# Patient Record
Sex: Female | Born: 1945 | Race: White | Hispanic: No | Marital: Married | Smoking: Former smoker
Health system: Southern US, Community
[De-identification: ages and names within clinical notes are randomized; demographics above are authoritative.]

## PROBLEM LIST (undated history)

## (undated) DIAGNOSIS — I119 Hypertensive heart disease without heart failure: Secondary | ICD-10-CM

## (undated) DIAGNOSIS — I10 Essential (primary) hypertension: Secondary | ICD-10-CM

## (undated) DIAGNOSIS — E7800 Pure hypercholesterolemia, unspecified: Secondary | ICD-10-CM

## (undated) DIAGNOSIS — E119 Type 2 diabetes mellitus without complications: Secondary | ICD-10-CM

## (undated) DIAGNOSIS — F32A Depression, unspecified: Secondary | ICD-10-CM

## (undated) DIAGNOSIS — I2510 Atherosclerotic heart disease of native coronary artery without angina pectoris: Secondary | ICD-10-CM

## (undated) DIAGNOSIS — R0989 Other specified symptoms and signs involving the circulatory and respiratory systems: Secondary | ICD-10-CM

## (undated) DIAGNOSIS — Z87891 Personal history of nicotine dependence: Secondary | ICD-10-CM

## (undated) DIAGNOSIS — C50912 Malignant neoplasm of unspecified site of left female breast: Secondary | ICD-10-CM

## (undated) DIAGNOSIS — T8859XA Other complications of anesthesia, initial encounter: Secondary | ICD-10-CM

## (undated) DIAGNOSIS — K2100 Gastro-esophageal reflux disease with esophagitis, without bleeding: Secondary | ICD-10-CM

## (undated) DIAGNOSIS — F329 Major depressive disorder, single episode, unspecified: Secondary | ICD-10-CM

## (undated) DIAGNOSIS — I739 Peripheral vascular disease, unspecified: Secondary | ICD-10-CM

## (undated) DIAGNOSIS — I2699 Other pulmonary embolism without acute cor pulmonale: Secondary | ICD-10-CM

## (undated) DIAGNOSIS — T4145XA Adverse effect of unspecified anesthetic, initial encounter: Secondary | ICD-10-CM

## (undated) HISTORY — PX: MASTECTOMY: SHX3

## (undated) HISTORY — DX: Personal history of nicotine dependence: Z87.891

## (undated) HISTORY — DX: Major depressive disorder, single episode, unspecified: F32.9

## (undated) HISTORY — PX: CATARACT EXTRACTION W/ INTRAOCULAR LENS  IMPLANT, BILATERAL: SHX1307

## (undated) HISTORY — DX: Gastro-esophageal reflux disease with esophagitis: K21.0

## (undated) HISTORY — PX: EYE SURGERY: SHX253

## (undated) HISTORY — PX: BUNIONECTOMY: SHX129

## (undated) HISTORY — PX: RETINAL DETACHMENT SURGERY: SHX105

## (undated) HISTORY — DX: Pure hypercholesterolemia, unspecified: E78.00

## (undated) HISTORY — DX: Atherosclerotic heart disease of native coronary artery without angina pectoris: I25.10

## (undated) HISTORY — DX: Gastro-esophageal reflux disease with esophagitis, without bleeding: K21.00

## (undated) HISTORY — DX: Depression, unspecified: F32.A

## (undated) HISTORY — PX: LAPAROSCOPIC CHOLECYSTECTOMY: SUR755

## (undated) HISTORY — DX: Essential (primary) hypertension: I10

## (undated) HISTORY — PX: BREAST BIOPSY: SHX20

---

## 1997-06-15 ENCOUNTER — Emergency Department (HOSPITAL_COMMUNITY): Admission: EM | Admit: 1997-06-15 | Discharge: 1997-06-15 | Payer: Self-pay | Admitting: Emergency Medicine

## 1997-06-18 ENCOUNTER — Encounter (HOSPITAL_COMMUNITY): Admission: RE | Admit: 1997-06-18 | Discharge: 1997-09-16 | Payer: Self-pay | Admitting: Emergency Medicine

## 1998-09-27 ENCOUNTER — Ambulatory Visit: Admission: RE | Admit: 1998-09-27 | Discharge: 1998-09-27 | Payer: Self-pay | Admitting: Family Medicine

## 1998-12-13 ENCOUNTER — Other Ambulatory Visit: Admission: RE | Admit: 1998-12-13 | Discharge: 1998-12-13 | Payer: Self-pay | Admitting: Family Medicine

## 2000-05-14 ENCOUNTER — Other Ambulatory Visit: Admission: RE | Admit: 2000-05-14 | Discharge: 2000-05-14 | Payer: Self-pay | Admitting: Family Medicine

## 2001-06-02 ENCOUNTER — Other Ambulatory Visit: Admission: RE | Admit: 2001-06-02 | Discharge: 2001-06-02 | Payer: Self-pay | Admitting: Family Medicine

## 2002-02-11 ENCOUNTER — Inpatient Hospital Stay (HOSPITAL_COMMUNITY): Admission: EM | Admit: 2002-02-11 | Discharge: 2002-02-14 | Payer: Self-pay | Admitting: Emergency Medicine

## 2002-02-11 ENCOUNTER — Encounter: Payer: Self-pay | Admitting: Family Medicine

## 2002-02-11 ENCOUNTER — Encounter: Payer: Self-pay | Admitting: Emergency Medicine

## 2002-02-23 ENCOUNTER — Encounter: Admission: RE | Admit: 2002-02-23 | Discharge: 2002-02-23 | Payer: Self-pay | Admitting: Family Medicine

## 2003-04-16 ENCOUNTER — Other Ambulatory Visit: Admission: RE | Admit: 2003-04-16 | Discharge: 2003-04-16 | Payer: Self-pay | Admitting: Family Medicine

## 2003-12-25 ENCOUNTER — Ambulatory Visit: Payer: Self-pay | Admitting: Family Medicine

## 2004-04-12 HISTORY — PX: CARDIAC CATHETERIZATION: SHX172

## 2004-05-01 ENCOUNTER — Other Ambulatory Visit: Admission: RE | Admit: 2004-05-01 | Discharge: 2004-05-01 | Payer: Self-pay | Admitting: Family Medicine

## 2004-05-01 ENCOUNTER — Ambulatory Visit: Payer: Self-pay | Admitting: Family Medicine

## 2004-05-12 ENCOUNTER — Inpatient Hospital Stay (HOSPITAL_COMMUNITY): Admission: EM | Admit: 2004-05-12 | Discharge: 2004-05-20 | Payer: Self-pay | Admitting: Emergency Medicine

## 2004-05-12 ENCOUNTER — Ambulatory Visit: Payer: Self-pay | Admitting: Cardiology

## 2004-05-12 ENCOUNTER — Encounter: Payer: Self-pay | Admitting: Emergency Medicine

## 2004-05-12 HISTORY — PX: CORONARY ARTERY BYPASS GRAFT: SHX141

## 2004-05-13 ENCOUNTER — Encounter: Payer: Self-pay | Admitting: Cardiology

## 2004-05-28 ENCOUNTER — Ambulatory Visit: Payer: Self-pay | Admitting: Cardiology

## 2004-09-02 ENCOUNTER — Ambulatory Visit: Payer: Self-pay | Admitting: Cardiology

## 2004-09-09 ENCOUNTER — Ambulatory Visit: Payer: Self-pay | Admitting: Cardiology

## 2004-10-24 ENCOUNTER — Ambulatory Visit: Payer: Self-pay | Admitting: Cardiology

## 2004-10-31 ENCOUNTER — Ambulatory Visit: Payer: Self-pay | Admitting: Cardiology

## 2005-01-08 ENCOUNTER — Ambulatory Visit: Payer: Self-pay | Admitting: Cardiology

## 2005-02-16 ENCOUNTER — Ambulatory Visit: Payer: Self-pay | Admitting: Cardiology

## 2005-09-02 ENCOUNTER — Ambulatory Visit: Payer: Self-pay | Admitting: Cardiology

## 2005-09-09 ENCOUNTER — Ambulatory Visit: Payer: Self-pay

## 2006-07-05 ENCOUNTER — Other Ambulatory Visit: Admission: RE | Admit: 2006-07-05 | Discharge: 2006-07-05 | Payer: Self-pay | Admitting: Obstetrics & Gynecology

## 2006-08-19 ENCOUNTER — Encounter: Admission: RE | Admit: 2006-08-19 | Discharge: 2006-08-19 | Payer: Self-pay | Admitting: Obstetrics & Gynecology

## 2006-09-01 ENCOUNTER — Encounter (INDEPENDENT_AMBULATORY_CARE_PROVIDER_SITE_OTHER): Payer: Self-pay | Admitting: Diagnostic Radiology

## 2006-09-01 ENCOUNTER — Encounter: Admission: RE | Admit: 2006-09-01 | Discharge: 2006-09-01 | Payer: Self-pay | Admitting: Obstetrics & Gynecology

## 2006-10-14 ENCOUNTER — Ambulatory Visit (HOSPITAL_COMMUNITY): Admission: RE | Admit: 2006-10-14 | Discharge: 2006-10-15 | Payer: Self-pay | Admitting: Ophthalmology

## 2006-10-18 DIAGNOSIS — I10 Essential (primary) hypertension: Secondary | ICD-10-CM | POA: Insufficient documentation

## 2006-10-18 DIAGNOSIS — E1051 Type 1 diabetes mellitus with diabetic peripheral angiopathy without gangrene: Secondary | ICD-10-CM

## 2006-10-18 DIAGNOSIS — F329 Major depressive disorder, single episode, unspecified: Secondary | ICD-10-CM

## 2006-10-18 DIAGNOSIS — E1065 Type 1 diabetes mellitus with hyperglycemia: Secondary | ICD-10-CM

## 2007-06-29 ENCOUNTER — Encounter: Admission: RE | Admit: 2007-06-29 | Discharge: 2007-06-29 | Payer: Self-pay | Admitting: Family Medicine

## 2007-11-10 ENCOUNTER — Ambulatory Visit: Payer: Self-pay | Admitting: Cardiology

## 2007-11-10 LAB — CONVERTED CEMR LAB: Total CK: 153 units/L (ref 7–177)

## 2008-01-19 ENCOUNTER — Ambulatory Visit: Payer: Self-pay | Admitting: Cardiology

## 2008-01-19 ENCOUNTER — Ambulatory Visit: Payer: Self-pay

## 2008-02-07 ENCOUNTER — Encounter: Payer: Self-pay | Admitting: Cardiology

## 2008-02-07 ENCOUNTER — Ambulatory Visit: Payer: Self-pay

## 2008-02-07 ENCOUNTER — Ambulatory Visit: Payer: Self-pay | Admitting: Cardiology

## 2008-05-10 DIAGNOSIS — K210 Gastro-esophageal reflux disease with esophagitis: Secondary | ICD-10-CM

## 2008-05-10 DIAGNOSIS — I25810 Atherosclerosis of coronary artery bypass graft(s) without angina pectoris: Secondary | ICD-10-CM

## 2008-05-10 DIAGNOSIS — E7800 Pure hypercholesterolemia, unspecified: Secondary | ICD-10-CM | POA: Insufficient documentation

## 2008-05-14 ENCOUNTER — Ambulatory Visit: Payer: Self-pay | Admitting: Cardiology

## 2008-05-14 DIAGNOSIS — I739 Peripheral vascular disease, unspecified: Secondary | ICD-10-CM | POA: Insufficient documentation

## 2008-05-14 DIAGNOSIS — R609 Edema, unspecified: Secondary | ICD-10-CM

## 2008-05-18 ENCOUNTER — Encounter: Payer: Self-pay | Admitting: Cardiology

## 2008-05-18 ENCOUNTER — Ambulatory Visit: Payer: Self-pay

## 2008-05-23 ENCOUNTER — Encounter: Payer: Self-pay | Admitting: Cardiology

## 2008-05-23 ENCOUNTER — Ambulatory Visit: Payer: Self-pay

## 2008-07-02 ENCOUNTER — Ambulatory Visit: Payer: Self-pay | Admitting: Cardiology

## 2008-07-02 DIAGNOSIS — I872 Venous insufficiency (chronic) (peripheral): Secondary | ICD-10-CM | POA: Insufficient documentation

## 2008-09-25 ENCOUNTER — Telehealth: Payer: Self-pay | Admitting: Cardiology

## 2009-02-05 ENCOUNTER — Encounter: Admission: RE | Admit: 2009-02-05 | Discharge: 2009-02-05 | Payer: Self-pay | Admitting: Obstetrics & Gynecology

## 2009-02-11 ENCOUNTER — Encounter: Admission: RE | Admit: 2009-02-11 | Discharge: 2009-02-11 | Payer: Self-pay | Admitting: Obstetrics & Gynecology

## 2009-02-12 ENCOUNTER — Encounter: Admission: RE | Admit: 2009-02-12 | Discharge: 2009-02-12 | Payer: Self-pay | Admitting: Obstetrics & Gynecology

## 2009-02-20 ENCOUNTER — Telehealth: Payer: Self-pay | Admitting: Cardiology

## 2009-02-21 ENCOUNTER — Encounter: Admission: RE | Admit: 2009-02-21 | Discharge: 2009-02-21 | Payer: Self-pay | Admitting: Obstetrics & Gynecology

## 2009-02-26 ENCOUNTER — Ambulatory Visit: Payer: Self-pay | Admitting: Cardiology

## 2009-02-26 DIAGNOSIS — R0989 Other specified symptoms and signs involving the circulatory and respiratory systems: Secondary | ICD-10-CM

## 2009-03-13 ENCOUNTER — Ambulatory Visit: Payer: Self-pay

## 2009-03-13 ENCOUNTER — Encounter: Payer: Self-pay | Admitting: Cardiology

## 2009-03-19 ENCOUNTER — Encounter: Admission: RE | Admit: 2009-03-19 | Discharge: 2009-03-19 | Payer: Self-pay | Admitting: Surgery

## 2009-03-21 ENCOUNTER — Ambulatory Visit (HOSPITAL_BASED_OUTPATIENT_CLINIC_OR_DEPARTMENT_OTHER): Admission: RE | Admit: 2009-03-21 | Discharge: 2009-03-21 | Payer: Self-pay | Admitting: Surgery

## 2009-03-27 ENCOUNTER — Encounter: Payer: Self-pay | Admitting: Cardiology

## 2009-03-29 ENCOUNTER — Ambulatory Visit: Payer: Self-pay | Admitting: Oncology

## 2009-04-03 ENCOUNTER — Encounter: Payer: Self-pay | Admitting: Cardiology

## 2009-05-29 ENCOUNTER — Encounter: Payer: Self-pay | Admitting: Cardiology

## 2009-10-30 ENCOUNTER — Ambulatory Visit: Payer: Self-pay | Admitting: Cardiology

## 2009-10-30 ENCOUNTER — Encounter: Payer: Self-pay | Admitting: Cardiology

## 2009-11-22 ENCOUNTER — Encounter: Payer: Self-pay | Admitting: Cardiology

## 2009-12-19 ENCOUNTER — Emergency Department (HOSPITAL_COMMUNITY): Admission: EM | Admit: 2009-12-19 | Discharge: 2009-08-06 | Payer: Self-pay | Admitting: Emergency Medicine

## 2010-02-13 ENCOUNTER — Other Ambulatory Visit: Payer: Self-pay | Admitting: Surgery

## 2010-02-13 DIAGNOSIS — Z1239 Encounter for other screening for malignant neoplasm of breast: Secondary | ICD-10-CM

## 2010-02-13 NOTE — Assessment & Plan Note (Signed)
Summary: breast CA--possible surgery   Visit Type:  Follow-up Primary Provider:  Venita Lick [name]  CC:  Breast Ca- possible surgery.  History of Present Illness: Very small cancer.  They have discussed options.  So she is scheduled for mastectomy.  Had bypass surgery in 2006.  She is doing well.  No chest pain with exertion.  Not smoking at present.     Current Medications (verified): 1)  Adult Aspirin Low Strength 81 Mg  Tbdp (Aspirin) .... Take 1 Tablet By Mouth Once A Day 2)  Vytorin 10-80 Mg Tabs (Ezetimibe-Simvastatin) .... Take 1 Tablet By Mouth Once A Day 3)  Metformin Hcl 500 Mg Xr24h-Tab (Metformin Hcl) .... Take 2 Tablets Two Times A Day 4)  Zoloft 50 Mg Tabs (Sertraline Hcl) .... Take 1 Tablet By Mouth Once A Day 5)  Carvedilol 6.25 Mg Tabs (Carvedilol) .... Take One Tablet By Mouth Twice A Day  Allergies: 1)  ! Niacin  Vital Signs:  Patient profile:   65 year old female Height:      68 inches Weight:      190.50 pounds BMI:     29.07 Pulse rate:   66 / minute Pulse rhythm:   regular Resp:     18 per minute BP sitting:   146 / 80  (left arm) Cuff size:   large  Vitals Entered By: Vikki Ports (February 26, 2009 11:54 AM)  Physical Exam  General:  Well developed, well nourished, in no acute distress. Head:  normocephalic and atraumatic Neck:  Left carotid bruit.   Lungs:  Clear bilaterally to auscultation and percussion. Heart:  Non-displaced PMI, chest non-tender; regular rate and rhythm, S1, S2 without murmurs, rubs or gallops.  Abdomen:  Bowel sounds positive; abdomen soft and non-tender without masses, organomegaly, or hernias noted. No hepatosplenomegaly. Pulses:  pulses normal in all 4 extremities Extremities:  No clubbing or cyanosis. Neurologic:  Alert and oriented x 3.   EKG  Procedure date:  02/07/2008  Findings:       Exercise Tolerance Test Results:    Ordering MD:        Stanford Breed    Interpreting MD:     Stanford Breed  Indication for ETT:     known ASHD    Contraindication to ETT:   no    Stress Modality:     exercise-treadmill    Cardiac Imaging Performed:   none    Protocol:       Standard Bruce-maximal    Maximum BP:        177 / 63    MPHR (bpm):        160    85% MPHR (bpm):     136    MHR obtained (bpm):        137    Reached 85% MPHR       (min:sec):       3:00    Total Exercise Time       (min:sec):       3    Workload in METS:     4.7    Borg Scale:       15    ST Segment analysis:       At Rest:       normal ST segments-no evidence of significant ST depression       With Exercise:     no evidence of significant ST depression    Arrhythmia:  yes    Arrhythmia description:   Bigeminal PVC's    Angina during ETT:     absent (0)  Cardiovascular Risk Assessment/Plan:       The patient's hypertensive risk group is category C: Target organ damage and/or diabetes.  Today's blood pressure is 147/78.    Exercise Tolerance Test Assessment:    Quality of ETT:   diagnostic    ETT Interpretation:   normal-no evidence of ischemia by ST analysis    Comments:     Limited due to deconditioning.      Recommendations:   More regular exercise        Signed by Ronaldo Miyamoto, MD, [facility] on 02/07/2008 at 12:44 PM   EKG  Procedure date:  02/26/2009  Findings:      NSR.  Left axis deviation. RV conduction delay.   Impression & Recommendations:  Problem # 1:  CAD, ARTERY BYPASS GRAFT (ICD-414.04)  stable at present. No chest pain. GXT was normal in January of 2010, but with liimited ETT.  No further testing needed prior to breast cancer testing. The following medications were removed from the medication list:    Carvedilol 3.125 Mg Tabs (Carvedilol) .[name]... Take one tablet by mouth twice a day Her updated medication list for this problem includes:    Adult Aspirin Low Strength 81 Mg Tbdp (Aspirin) .[name]... Take 1 tablet by mouth once a day    Carvedilol 6.25 Mg Tabs (Carvedilol)  .[name]... Take one tablet by mouth twice a day  Orders: EKG w/ Interpretation (93000) Carotid Duplex (Carotid Duplex)  Problem # 2:  CAROTID BRUIT, LEFT (ICD-785.9)  Noted on exam.  Continue current medications.  Will do carotid doppler to assess. noted previously per patient.  Orders: EKG w/ Interpretation (93000) Carotid Duplex (Carotid Duplex)  Problem # 3:  HYPERCHOLESTEROLEMIA (ICD-272.0)  On high dose simva in vytorin.  Will reduce based on FDA warning.  Recheck in 6 weeks with Dr. Tenny Craw.  May need alternative agent. The following medications were removed from the medication list:    Niacin Cr 500 Mg Cr-caps (Niacin) .[name]... Take 1 capsule by mouth once a day Her updated medication list for this problem includes:    Vytorin 10-40 Mg Tabs (Ezetimibe-simvastatin) .[name]... Take one tablet by mouth dailyat bedtime  The following medications were removed from the medication list:    Niacin Cr 500 Mg Cr-caps (Niacin) .[name]... Take 1 capsule by mouth once a day Her updated medication list for this problem includes:    Vytorin 10-80 Mg Tabs (Ezetimibe-simvastatin) .[name]... Take 1 tablet by mouth once a day  Orders: EKG w/ Interpretation (93000) Carotid Duplex (Carotid Duplex)  Problem # 4:  VENOUS INSUFFICIENCY, LEFT LEG (ICD-459.81) stable without change.    Patient Instructions: 1)  Your physician wants you to follow-up in:   6 MONTHS. You will receive a reminder letter in the mail two months in advance. If you don't receive a letter, please call our office to schedule the follow-up appointment. 2)  Your physician has requested that you have a carotid duplex. This test is an ultrasound of the carotid arteries in your neck. It looks at blood flow through these arteries that supply the brain with blood. Allow one hour for this exam. There are no restrictions or special instructions. 3)  Your physician recommends that you return for a FASTING lipid profile and liver profile in 6 WEEKS (this can be  done with Dr Tenny Craw)  4)  Your physician has  recommended you make the following change in your medication: Change Vytorin dose to 10/40 mg once a day Prescriptions: VYTORIN 10-40 MG TABS (EZETIMIBE-SIMVASTATIN) Take one tablet by mouth dailyat bedtime  #30 x 8   Entered by:   Julieta Gutting, RN, BSN   Authorized by:   Ronaldo Miyamoto, MD, [facility]   Signed by:   Julieta Gutting, RN, BSN on 02/26/2009   Method used:   Electronically to        [facility]  [place] [address] (retail)       [address]       [place], [place]  [address]       Ph: [phone] or [phone]       Fax: [phone]   RxID:   [id number]

## 2010-02-13 NOTE — Letter (Signed)
Summary: Dr Lavonda Jumbo Office [facility]   Dr Lavonda Jumbo [facility]   Imported By: Roderic Ovens 07/31/2009 10:20:58  _____________________________________________________________________  External Attachment:    Type:   Image     Comment:   External Document

## 2010-02-13 NOTE — Assessment & Plan Note (Signed)
Summary: f10m   Visit Type:  6 months follow up Primary Provider:  Venita Lick allan  CC:  Chest discomfort.  History of Present Illness: Had mastectomy, and overall doing well.  Node was negative so no specific treatment at present per patient choice.  BP has been up slgihtly, but sister died, traveling, and ten pound weight gain.  Some swelling of left arm since surgery.  Does not smoke. Sugars have been terrible.    Current Medications (verified): 1)  Adult Aspirin Low Strength 81 Mg  Tbdp (Aspirin) .... Take 1 Tablet By Mouth Once A Day 2)  Vytorin 10-40 Mg Tabs (Ezetimibe-Simvastatin) .... Take One Tablet By Mouth Dailyat Bedtime 3)  Metformin Hcl 500 Mg Xr24h-Tab (Metformin Hcl) .... Take 2 Tablets Two Times A Day 4)  Zoloft 50 Mg Tabs (Sertraline Hcl) .... Take 1  Tablet By Mouth Once A Day 5)  Carvedilol 6.25 Mg Tabs (Carvedilol) .... Take One Tablet By Mouth Twice A Day 6)  Tylenol Pm Extra Strength 500-25 Mg Tabs (Diphenhydramine-Apap (Sleep)) .... Take As Needed  Allergies: 1)  ! Niacin  Vital Signs:  Patient profile:   65 year old female Height:      68 inches Weight:      192.13 pounds BMI:     29.32 Pulse rate:   60 / minute Pulse rhythm:   regular Resp:     18 per minute BP sitting:   136 / 90  (left arm) Cuff size:   large  Vitals Entered By: Vikki Ports (October 30, 2009 11:46 AM)  Physical Exam  General:  Well developed, well nourished, in no acute distress. Head:  normocephalic and atraumatic Eyes:  PERRLA/EOM intact; conjunctiva and lids normal. Neck:  soft left carotid bruit. Lungs:  Clear bilaterally to auscultation and percussion. Heart:  PMI non displaced. No murmur or rub, or gallop Msk:  Back normal, normal gait. Muscle strength and tone normal.   EKG  Procedure date:  10/30/2009  Findings:      NSR.  Non specific T abnormality.  Impression & Recommendations:  Problem # 1:  CAD, ARTERY BYPASS GRAFT (ICD-414.04)  Remains stable at the  present time.  Denies any chest pain whatsoever.  No changes Her updated medication list for this problem includes:    Adult Aspirin Low Strength 81 Mg Tbdp (Aspirin) .[name]... Take 1 tablet by mouth once a day    Carvedilol 6.25 Mg Tabs (Carvedilol) .[name]... Take one tablet by mouth twice a day  Orders: EKG w/ Interpretation (93000)  Problem # 2:  HYPERCHOLESTEROLEMIA (ICD-272.0) Followed by Dr. Tenny Craw.  Issues reviewed with regard to Zetia.  Improve IT study discussed.  Her updated medication list for this problem includes:    Vytorin 10-40 Mg Tabs (Ezetimibe-simvastatin) .[name]... Take one tablet by mouth dailyat bedtime  Problem # 3:  CAROTID BRUIT, LEFT (ICD-785.9) Very soft.  Doppler follow up next year.  Problem # 4:  HYPERTENSION (ICD-401.9) Have been higher.  Might consider addition of ACE, but renal function might need to be considered closely. Her updated medication list for this problem includes:    Adult Aspirin Low Strength 81 Mg Tbdp (Aspirin) .[name]... Take 1 tablet by mouth once a day    Carvedilol 6.25 Mg Tabs (Carvedilol) .[name]... Take one tablet by mouth twice a day  Patient Instructions: 1)  Your physician recommends that you continue on your current medications as directed. Please refer to the Current Medication list given to you today. 2)  Your physician wants you to follow-up in:  1 YEAR.  You will receive a reminder letter in the mail two months in advance. If you don't receive a letter, please call our office to schedule the follow-up appointment. 3)  Your physician has requested that you regularly monitor and record your blood pressure readings at home.  Please use the same machine at the same time of day to check your readings and record them to bring to your follow-up visit.  Please contact Dr Tenny Craw if your BP remains elevated.  4)  Your physician discussed the importance of regular exercise and recommended that you start or continue a regular exercise program for good health.

## 2010-02-13 NOTE — Letter (Signed)
Summary: [facility] - Office Visit  [facility] - Office Visit   Imported By: Marylou Mccoy 12/02/2009 10:39:21  _____________________________________________________________________  External Attachment:    Type:   Image     Comment:   External Document

## 2010-02-13 NOTE — Progress Notes (Signed)
Summary:  SURGICAL CLEARANCE  Phone Note Call from Patient Call back at Home Phone [phone] Call back at [phone]   Caller: Patient Summary of Call: PT HAVE QUESTION ABOUT A PROCEDURE SHE 'S HAVING WANT TO MAKE SURE SHE DON'T NEED SURGICAL CLEARANCE. Initial call taken by: Judie Grieve,  February 20, 2009 1:17 PM  Follow-up for Phone Call        Spoke with patient. She would like to know if she needs to be seen by Dr. Riley Kill prior having surgery for CA. She possible have a mastectomy or a lumpbectomy by Dr. Ezzard Standing. Last office visit with Dr. Riley Kill was 07/02/08. I let pt. know will send message to MD and his nurse desktop. Okay with pt. Ollen Gross, RN, BSN  February 20, 2009 1:31 PM    Additional Follow-up for Phone Call Additional follow up Details #1::        I spoke with the pt and arranged a follow-up appt for the pt to see Dr Riley Kill on 02/26/09. Additional Follow-up by: Julieta Gutting, RN, BSN,  February 22, 2009 10:07 AM

## 2010-02-13 NOTE — Letter (Signed)
Summary: Dr Lavonda Jumbo Office Note   Dr Lavonda Jumbo Office Note   Imported By: Roderic Ovens 08/12/2009 13:02:19  _____________________________________________________________________  External Attachment:    Type:   Image     Comment:   External Document

## 2010-02-26 ENCOUNTER — Ambulatory Visit
Admission: RE | Admit: 2010-02-26 | Discharge: 2010-02-26 | Disposition: A | Discharge status: Home / Self Care | Payer: 59 | Source: Ambulatory Visit | Attending: Surgery | Admitting: Surgery

## 2010-02-26 DIAGNOSIS — Z1239 Encounter for other screening for malignant neoplasm of breast: Secondary | ICD-10-CM

## 2010-03-11 NOTE — Progress Notes (Signed)
Summary: Office Visit  Office Visit   Imported By: Earl Many 03/07/2010 09:25:38  _____________________________________________________________________  External Attachment:    Type:   Image     Comment:   External Document

## 2010-04-07 LAB — BASIC METABOLIC PANEL
CO2: 28 mEq/L (ref 19–32)
Chloride: 106 mEq/L (ref 96–112)
GFR calc Af Amer: >60 mL/min (ref >60)
Potassium: 3.9 mEq/L (ref 3.5–5.1)
Sodium: 142 mEq/L (ref 135–145)

## 2010-04-07 LAB — CBC
HCT: 35.6 % — ABNORMAL LOW (ref 36.0–46.0)
Hemoglobin: 12.5 g/dL (ref 12.0–15.0)
MCV: 90.2 fL (ref 78.0–100.0)
RBC: 3.95 MIL/uL (ref 3.87–5.11)
WBC: 5.8 10*3/uL (ref 4.0–10.5)

## 2010-04-07 LAB — DIFFERENTIAL
Eosinophils Absolute: 0.3 10*3/uL (ref 0.0–0.7)
Eosinophils Relative: 6 % — ABNORMAL HIGH (ref 0–5)
Lymphocytes Relative: 31 % (ref 12–46)
Lymphs Abs: 1.8 10*3/uL (ref 0.7–4.0)
Monocytes Relative: 7 % (ref 3–12)

## 2010-04-07 LAB — CANCER ANTIGEN 27.29: CA 27.29: 16 U/mL (ref 0–39)

## 2010-04-07 LAB — GLUCOSE, CAPILLARY

## 2010-04-09 ENCOUNTER — Other Ambulatory Visit: Payer: Self-pay | Admitting: Oncology

## 2010-04-09 ENCOUNTER — Encounter (HOSPITAL_BASED_OUTPATIENT_CLINIC_OR_DEPARTMENT_OTHER): Payer: 59 | Admitting: Oncology

## 2010-04-09 DIAGNOSIS — D0590 Unspecified type of carcinoma in situ of unspecified breast: Secondary | ICD-10-CM

## 2010-04-09 LAB — CBC WITH DIFFERENTIAL/PLATELET
BASO%: 0.8 % (ref 0.0–2.0)
Eosinophils Absolute: 0.1 10*3/uL (ref 0.0–0.5)
HCT: 39.5 % (ref 34.8–46.6)
LYMPH%: 33 % (ref 14.0–49.7)
MCHC: 34 g/dL (ref 31.5–36.0)
MONO#: 0.3 10*3/uL (ref 0.1–0.9)
NEUT#: 3.3 10*3/uL (ref 1.5–6.5)
NEUT%: 58 % (ref 38.4–76.8)
Platelets: 247 10*3/uL (ref 145–400)
RBC: 4.42 10*6/uL (ref 3.70–5.45)
WBC: 5.6 10*3/uL (ref 3.9–10.3)
lymph#: 1.9 10*3/uL (ref 0.9–3.3)

## 2010-04-09 LAB — COMPREHENSIVE METABOLIC PANEL
ALT: 16 U/L (ref 0–35)
CO2: 23 mEq/L (ref 19–32)
Calcium: 9.5 mg/dL (ref 8.4–10.5)
Chloride: 103 mEq/L (ref 96–112)
Glucose, Bld: 155 mg/dL — ABNORMAL HIGH (ref 70–99)
Sodium: 137 mEq/L (ref 135–145)
Total Bilirubin: 0.6 mg/dL (ref 0.3–1.2)
Total Protein: 7.8 g/dL (ref 6.0–8.3)

## 2010-05-12 ENCOUNTER — Ambulatory Visit: Payer: 59 | Attending: Oncology | Admitting: Physical Therapy

## 2010-05-12 DIAGNOSIS — M24519 Contracture, unspecified shoulder: Secondary | ICD-10-CM | POA: Insufficient documentation

## 2010-05-12 DIAGNOSIS — IMO0001 Reserved for inherently not codable concepts without codable children: Secondary | ICD-10-CM | POA: Insufficient documentation

## 2010-05-12 DIAGNOSIS — Z853 Personal history of malignant neoplasm of breast: Secondary | ICD-10-CM | POA: Insufficient documentation

## 2010-05-27 NOTE — Op Note (Signed)
NAMECYNARA, Taylor [name]               ACCOUNT NO.:  [id number]   MEDICAL RECORD NO.:  [id number]          PATIENT TYPE:  AMB   LOCATION:  SDS                          FACILITY:  [facility]   PHYSICIAN:  John D. Ashley Royalty, M.D. 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   PROCEDURES:  1. Scleral buckle right eye.  2. Retinal photocoagulation right eye.   SURGEON:  Beulah Gandy. Ashley Royalty, M.D.   ASSISTANT:  Bryan Lemma. Lundquist, P.A.   ANESTHESIA:  General.   DETAILS:  The usual prep and drape, 360-degree limbal peritomy.  Isolation of 4 rectus muscles with 2-0 silk.  Scleral dissection from 1  o'clock to 11 o'clock to admit a #279 intrascleral implant, diathermy  placed in the bed.  A #279 implant placed again in the globe with a 240  band and a 270 sleeve at 2 o'clock.  Perforation site at 4 o'clock  revealed a small amount of colorless subretinal fluid.  A second  perforation at 5 o'clock revealed a moderate amount of yellowish  subretinal fluid.  A third perforation at 7 o'clock revealed a moderate  amount of clear subretinal fluid.  Indirect ophthalmoscopy showed the  retina to be lying nicely on the scleral buckle after the third  perforation.  The buckle was adjusted and trimmed.  The band was  adjusted and trimmed.  The scleral flaps were closed with nine  interrupted 4-0 Mersilene sutures.  The sutures knotted and the free  ends removed.  The indirect ophthalmoscope laser was moved into place;  554 burns were placed on the scleral buckle around the area of  detachment with a power of 500 mW 1000 microns each and 0.05 seconds  each.  The conjunctiva was reposited with 7-0 chromic suture.  Polymyxin  and gentamicin were irrigated at the  tenon space.  Atropine solution  was applied.  Decadron 10 mg was injected into the lower subconjunctival  space.  Marcaine was injected around the globe for postop pain.  TobraDex ophthalmic ointment, a patch and shield were placed.  Patient  was awakened and taken to recovery in satisfactory condition.  Closing  pressure was 15 with a [place] tonometer.      Beulah Gandy. Ashley Royalty, M.D.  Electronically Signed     JDM/MEDQ  D:  10/14/2006  T:  10/14/2006  Job:  [id number]

## 2010-05-27 NOTE — Assessment & Plan Note (Signed)
[facility]                            CARDIOLOGY OFFICE NOTE   NAME:Totten, ZIONNA HOMEWOOD                      MRN:          [id number]  DATE:11/10/2007                            DOB:          03-Jun-1945    Ms. Milberger is in for a followup visit.  She is stable.  She does have  some intermittent chest discomfort.  She is under tremendous amount of  stress.  Specifically, her mother-in-law has been living with her for  the past 5 months, and has what sounds like advanced dementia.  She also  has just one child, and there has been a significant relationship issue  with her son's girlfriend.  She says this is creating quite a bit of  stress.  Importantly, she has quit smoking quite a bit of time ago.  She  denies any ongoing progressive chest pain.  She does have some  discomfort in the muscles of her arms.  She says they are weak at times.   Her medications include:  1. Toprol 25 mg a day.  2. Vytorin 10/80 daily.  3. Aspirin 81 mg daily.  4. Zoloft 100 mg daily.  5. Metformin 500 mg 2 tablets b.i.d.  6. Niacin at night.   On physical, she is alert and oriented in no distress.  Blood pressure  114/80, pulse 64.  No carotid bruits.  Lung fields clear.  PMI  nondisplaced.  Normal first and second heart sound without murmurs,  rubs, or gallops.  Extremities without edema.   The electrocardiogram demonstrates normal sinus rhythm.  There is a  leftward-oriented axis.  There is delayed R-wave progression related  probably to this as well.   IMPRESSION:  1. Coronary disease status post coronary bypass graft surgery.  2. Former smoker.  3. Hypercholesterolemia on lipid-lowering therapy.  4. Non-insulin-dependent diabetes mellitus.  5. History of reflux esophagitis.   RECOMMENDATIONS:  1. We will recommend a routine stress test that she is able to walk on      a regular basis.  2. We will check a CPK to check her arms to make sure this is not due  to any type of myositis.     Arturo Morton. Riley Kill, MD, [facility]  Electronically Signed    TDS/MedQ  DD: 11/10/2007  DT: 11/11/2007  Job #: [id number]

## 2010-05-27 NOTE — Letter (Signed)
February 07, 2008    C. Duane Lope, M.D.  [address]  [place], [place]  [address]   RE:  GEORGEANNE, FRANKLAND  MRN:  [id number]  /  DOB:  [birth year]   Dear Dr. Tenny Craw.   I had the pleasure of seeing your nice patient Starlyn Droge today in  followup for an exercise treadmill study.  She exercised for a little  over 3 minutes and had no chest pain or ST-segment depression and the  study was formally a negative study.  She does have limited exercise  tolerance, probably from poor conditioning.  She does, however, say that  she exercises regularly.  She exercised about the same time several  years ago.   She is scheduled to see you in followup soon.  Because of some muscle  discomfort, we did get a CPK on her which was normal back in October.  The total CK was 153 with a normal range of 7-177.  I did want to make  sure that she is following up with you closely with regard to this  because of her multidrug regimen.  I know that her HDL has been very low  and we have not been rechecking it.  I appreciate the opportunity of  sharing in her care, and if I can be of help in her management, please  do not hesitate to let me know.    Sincerely,      Arturo Morton. Riley Kill, MD, [facility]  Electronically Signed    TDS/MedQ  DD: 02/07/2008  DT: 02/08/2008  Job #: [id number]

## 2010-05-30 NOTE — Procedures (Signed)
[facility]                                EXERCISE TREADMILL   NAME:[name], Taylor FOLDEN                      MRN:          [id number]  DATE:09/09/2005                            DOB:          18-Sep-1945    EXERCISE TOLERANCE TEST:   DURATION OF EXERCISE:  Three minutes 48 seconds.   MAXIMUM HEART RATE:  At 139, percent of PMHR is 85%.   COMMENTS:  [name] exercised today on the Bruce protocol which was  fairly limited and there was evidence of deconditioning.  She experienced no  chest pain.  The electrocardiogram demonstrates a normal sinus rhythm.  There is no significant ST depression.  There is occasional premature  ventricular contraction.  Blood pressure response to exercise was  appropriate.  This was felt to be a nonischemic test response to exercise.   The patient has had prior bypass surgery.  She has been somewhat limited  recently.  She has had an upper respiratory infection.  I have encouraged  her strongly to follow up with Dr. Tenny Craw to get a repeat chest x-ray to  document resolution of her findings.  She has also had symptoms suggestive  of sinusitis.  Treatment is warranted.                                   Arturo Morton. Riley Kill, MD, [facility]   TDS/MedQ  DD:  09/09/2005  DT:  09/09/2005  Job #:  [id number]   cc:   C. Duane Lope, MD

## 2010-05-30 NOTE — Cardiovascular Report (Signed)
NAMEMANUELLA, Taylor [name]               ACCOUNT NO.:  [id number]   MEDICAL RECORD NO.:  [id number]           PATIENT TYPE:   LOCATION:                                 FACILITY:   PHYSICIAN:  Arturo Morton. Riley Kill, M.D. [facility] DATE OF BIRTH:   DATE OF PROCEDURE:  05/13/2004  DATE OF DISCHARGE:                              CARDIAC CATHETERIZATION   INDICATIONS:  Taylor [name] is a 65 year old woman who presents with chest  pain of three-weeks' duration.  She has developed T-wave inversion in the  anterior precordial leads and had low level enzymes suggestive of non ST-  elevation MI.  She was brought to the catheterization laboratory for further  evaluation.   PROCEDURE:  1.  Left heart catheterization  2.  Selective coronary arteriography.  3.  Selective left ventriculography.  4.  Subclavian angiography.   DESCRIPTION OF PROCEDURE:  The patient was brought to the catheterization  laboratory and prepped and draped in usual fashion.  Through an anterior  puncture the right femoral artery was easily entered.  A 6-French sheath was  placed.  Views of the left and right coronary arteries were obtained in  multiple angiographic projections.  Central aortic and left ventricular  pressures were measured with a pigtail.  Ventriculography was performed in  the RAO projection.  Subclavian angiography was done with the right coronary  catheter.  The patient was given 1 mg of intravenous Versed and she was  moderately sedated with this.  However, saturations remained normal  throughout the procedure.  She was subsequently taken to the holding area  for direct manual compression.  I discussed the case with her family and a  surgical consultation was obtained.  There were no complications.   HEMODYNAMIC DATA:  1.  Central aortic pressure 92/55, mean 70.  2.  Left ventricular pressure 104/9.  3.  No gradient on pullback across aortic valve.   ANGIOGRAPHIC DATA:  1.  Ventriculography was performed in the  RAO projection.  The mid and      distal anterolateral wall and distal inferior wall were severely      hypokinetic.  Because ventriculogram complicated by ventricular ectopy,      an exact ejection fraction could not be calculated.  Moreover, there was      some evidence of diastolic mitral regurgitation and some MR could not be      excluded based on this study.  2.  The subclavian demonstrates a long area of plaquing leading into the      subclavian.  There is about an 80% vertebral.  However, flow is without      reduction in pressure with the catheter across this area and the      internal mammary is widely patent.  3.  The left main is free of critical disease.  4.  The LAD is totally occluded.  The distal left anterior descending artery      fills by collaterals predominantly from the RCA, although some faint      collateralization is noted during the circumflex injection.  5.  There is  a small ramus intermedius that is free of critical disease.  6.  The circumflex provides one major marginal branch that is very large in      caliber.  There is 70% narrowing, although the mean lumen diameter is      probably about 2 mm.  7.  The right coronary artery demonstrates a little bit of damping with      injection.  There is about a 40% narrowing ostially, then about 30%      narrowings in the mid and distal vessel.  The PDA has about 50-70%      proximal narrowing, then a 70% stenosis.  The LAD then fills by      collaterals from the distal right injection.   CONCLUSION:  1.  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  2.  Three-vessel coronary artery disease.  3.  Moderate reduction in global left ventricular function in the left      anterior descending territory with evidence of left-to-right collaterals      and no Q-waves on EKG.  4.  Widely patent internal mammary with some evidence of subclavian stenosis      that is not hemodynamically significant and moderately  severe stenosis      of the ostium of the vertebral artery.   DISPOSITION:  1.  We will obtain a bedside 2-D echocardiogram to evaluate her mitral      regurgitation.  2.  Surgical consultation will be obtained for revascularization surgery.  3.  I have notified __________ about the issues regarding anesthesia and she      is notifying the anesthesia office about all of this.  In addition, she      will notify the surgeons.                                        ___________________________________________  Arturo Morton Riley Kill, M.D. [facility]    TDS/MEDQ  D:  05/13/2004  T:  05/13/2004  Job:  [id number]   cc:   Evelene Croon, M.D.  [address]  [place]  [place] [address]  Fax: [phone]   CV Lab   Eugenio Hoes. Tawanna Cooler, M.D. [facility]

## 2010-05-30 NOTE — Discharge Summary (Signed)
Taylor, [name]                         ACCOUNT NO.:  [id number]   MEDICAL RECORD NO.:  [id number]                   PATIENT TYPE:  INP   LOCATION:  4703                                 FACILITY:  [facility]   PHYSICIAN:  Rodolph Bong, M.D.                  DATE OF BIRTH:  1945/06/16   DATE OF ADMISSION:  02/11/2002  DATE OF DISCHARGE:  02/14/2002                                 DISCHARGE SUMMARY   PRIMARY CARE PHYSICIAN:  [facility].   DISCHARGE DIAGNOSES:  1. Syncope.  2. Diabetes mellitus.  3. Hypertension.  4. Leukocytosis.  5. Elevated liver function tests.   DISCHARGE MEDICATIONS:  1. Glucotrol 5 mg p.o. daily.  2. During her hospital stay Diovan, Effexor, and Zetia were all held.   HISTORY OF PRESENT ILLNESS:  The patient is a 65 year old female with a past  medical history significant for diabetes, hypertension, and  hypercholesterolemia, who was in her usual state of health until this  afternoon, when she suddenly felt strange.  She described generalized  discomfort and significant lightheadedness.  Upon lying down, she actually  began to feel worse and had a syncopal episode while attempting to leave her  bedroom to get help from her son.  Her son found her on the floor moaning  and partially responsive. There was no witnessed seizure activity or signs  or symptoms of postictal state.   HOSPITAL COURSE:  Problem 1.  Syncope.  Upon presentation to [facility] emergency department, the patient was found to be orthostatic and  dehydrated.  The most reasonable reasons for her syncope were hypovolemia  versus cardiac arrhythmias. While in the hospital, she was monitored on  telemetry with no dysrhythmia noted and ruled out for an MI by cardiac  enzymes.  Her diuretic medication was held. She was aggressively rehydrated  during her hospital stay and her blood pressure and pulse rate responded  appropriately. She did not experience any  syncopal episodes while in the  hospital and was discharged in stable condition.   Problem 2.  Diabetes mellitus.  Stable in hospital and in good control on  Glucotrol.   Problem 3.  Hypertension.  The patient was actually hypotensive upon  presentation to the ED with blood pressures 70 to 80 systolic.  She remained  hypo to normotensive while at [facility] and was discharged with  blood pressure approximately 110 to 120 over 50 to 60. She will not be  discharged on any hypertensive medications.   Problem 4.  Leukocytosis. Upon presentation to the ED, she was found to have  a white count of 14.8 with 97% neutrophils and greater than 20% bands. This  was worrisome for an occult infection.  While at the hospital, she underwent  a lumbar puncture which was negative for any organisms and no growth at two  days prior to discharge.  She also had a urine culture which was negative and  blood culture x2 which was negative at two days. She was empirically begun  on Primaxin therapy and this was discontinued the day prior to discharge  without any complications.  She did not have any fevers or other systemic  signs while in the hospital.   Problem 5.  Elevated liver function tests.  The patient was found upon  admission to have an elevated AST of 118 and ALT of 61.  She denied any  alcohol use. While in the hospital, her liver functions remained elevated  around 100 for each. On the day of discharge they had decreased somewhat to  AST of 42 and ALT 73.  Possible sources were shocked liver from  hypovolemia or hepatitis.  There is a hepatitis panel pending at the time of  discharge.   DISCHARGE INSTRUCTIONS:  Activity; No restrictions.  Diet; the patient was  advised to at least discuss her diet with the Weight Watcher's Plan prior to  beginning any diet. She was advised to drink plenty of fluids if she does  diet again so to avoid being dehydrated.   The patient was advised to return  to [facility] emergency room for  any other syncopal events or for any concerns.   FOLLOW UP:  The patient was advised to follow up with the [facility]. She already has an appointment scheduled on March 02, 2002.   DISCHARGE LABORATORY DATA:  There is a hepatitis panel pending at the time  of discharge.                                               Rodolph Bong, M.D.    AK/MEDQ  D:  02/14/2002  T:  02/15/2002  Job:  [id number]   cc:   [facility]

## 2010-05-30 NOTE — Assessment & Plan Note (Signed)
[facility]                              CARDIOLOGY OFFICE NOTE   NAME:Taylor [name], Taylor [name]                      MRN:          [id number]  DATE:09/02/2005                            DOB:          08-30-1945    Taylor [name] is in for a followup visit.  She says she has had a fairly  complicated course over the past spring.  In May, she developed what sounds  like bronchopneumonia.  It has taken her a long time to get over that.  She  has gradually and slowly improved, although she has continued to have what  sounds like an upper respiratory issue.  She has last seen Dr. Tenny Craw a few  weeks ago.  She has stopped smoking and has not had any recurrent problems.  She has also had problems with what sounds like reflux esophagitis with  nausea, recurrent burping and belching, and trouble at night.  She eats a  fair amount of tomatoes although does not drink much in the way of caffeine.  She has also had some pressure in the chest, although she says this is not  like what she had when she had her acute cardiac event.   On examination today, the blood pressure is 128/80.  The pulse is 74.  The  lung fields are clear, and the cardiac rhythm is regular without a  significant murmur.  The PMI is non-displaced.   The EKG reveals some delay in R-wave progression with a leftward oriented  axis and some premature ventricular complexes.  The R-wave progression is  delayed from the previous tracing but could be related to lead placement.   IMPRESSION:  1. Coronary artery disease, status post coronary artery bypass graft      surgery.  2. Hypercholesterolemia on lipid lowering therapy.  3. Non-insulin dependent diabetes mellitus.  4. Recent pulmonary infection treated with antibiotics.  5. Probable reflux esophagitis.   PLAN:  1. We will place the patient on a proton pump inhibitor.  2. I will have her return next week for an exercise tolerance test, as she      had  one as a baseline a year ago.  3. I have encouraged her to refrain from tomato and tomato based products.                              Arturo Morton. Riley Kill, MD, [facility]    TDS/MedQ  DD:  09/02/2005  DT:  09/03/2005  Job #:  [id number]   cc:   C. Duane Lope, MD

## 2010-05-30 NOTE — H&P (Signed)
NAMEKAMERAN, LALLIER               ACCOUNT NO.:  [id number]   MEDICAL RECORD NO.:  [id number]          PATIENT TYPE:  EMS   LOCATION:  ED                           FACILITY:  [facility]   PHYSICIAN:  Arturo Morton. Riley Kill, M.D. [facility] OF BIRTH:  July 18, 1945   DATE OF ADMISSION:  05/12/2004  DATE OF DISCHARGE:                                HISTORY & PHYSICAL   REASON FOR ADMISSION:  Ms. Powell is a 65 year old female with no prior  cardiac history but with multiple cardiac risk factors notable for  hypertension, hypercholesterolemia, type 2 diabetes mellitus, history of  tobacco smoking, family history, and age, who presents to [facility]  emergency room  with new onset chest discomfort.  The patient reports an  approximate three week history of mid sternal chest discomfort associated  with bilateral arm pain and occasional radiation to the teeth.  Her symptoms  have worsened over the last few weeks and, in fact, she had a particularly  bad episode late last week while mopping up some chocolate milk off her  kitchen floor.  Earlier this morning, the patient had recurrent pain which  has been her worse episode thus far, again, this was mid sternal, described  as dull and a pressure sensation, and rated a 10/10.  She had no significant  associated dyspnea, diaphoresis, or nausea.  She was taken by family to  [facility] emergency room  where initial electrocardiogram showed normal  sinus rhythm with no acute changes.  She was treated with nitroglycerin  paste, intravenous heparin, IV Protonix, and morphine.  On examination, the  patient reports her chest pain now down to a 1/10.  Initial cardiac markers  show elevation of troponin to 0.10.  A repeat electrocardiogram now reveals  symmetric T-wave inversion in the precordial and high lateral leads.   Of note, the patient had recently been scheduled for outpatient stress  testing which she reports was scheduled for tomorrow.  She had also  recently  been placed on Prilosec for presumed reflux symptoms and this was up  titrated to b.i.d. dosing a few days ago.   ALLERGIES:  No known drug allergies.  The patient denies any allergy to  shellfish or contrast material.   MEDICATIONS PRIOR TO ADMISSION:  Aspirin 81 mg daily, Tenoretic 50/25 mg 1/2  tablet daily, Zocor 40 mg q.h.s., Lisinopril 10 mg daily, Prilosec 20 mg  b.i.d., Glucotrol XL 10 mg daily.   PAST MEDICAL HISTORY:  Hypertension, hypercholesterolemia, and type 2  diabetes mellitus.  Status post cholecystectomy, bilateral cataract surgery,  and right renal detachment surgery (approximately eight years ago).   SOCIAL HISTORY:  The patient lives here in [place] with her husband.  They have one grown son.  She has not smoked for approximately one year, but  reports a prior 15 pack year history of tobacco smoking.  She denies alcohol  use.   FAMILY HISTORY:  Mother age 65, status post CABG ten years ago.  Father  deceased age 78, secondary to fatal first myocardial infarction.  The  patient has a 51 year old sister who underwent  bypass surgery earlier this  year.   REVIEW OF SYMPTOMS:  Denies any prior history of myocardial infarction,  congestive heart failure, gastroesophageal reflux disease, or peptic ulcer  disease.  Denies any recent evidence of upper or lower GI bleeding.  No  significant exertional dyspnea, denies any orthopnea or paroxysmal nocturnal  dyspnea.  She has occasional mild lower extremity edema.  The remaining  systems are negative.   PHYSICAL EXAMINATION:  VITAL SIGNS:  Blood pressure 140/92, pulse 86 and regular, respirations 20,  temperature 97.1, SAO2 99% on 2 liters.  GENERAL:  65 year old female in no apparent distress.  HEENT:  Normocephalic, atraumatic.  NECK:  Preserved bilateral pulses with soft, left carotid bruit, no JVD.  LUNGS:  Clear to auscultation in all fields.  HEART:  Regular rate and rhythm (S1 and S2), no  significant murmurs.  ABDOMEN:  Soft, nontender with intact bowel wounds.  EXTREMITIES:  Preserved bilateral femoral pulses without bruits, intact  distal pulses with no significant pedal edema.  NEUROLOGICAL:  No focal deficit.   LABORATORY DATA:  Admission chest x-ray pending.  Initial EKG shows NSR with  nonspecific changes.  Follow up EKG shows dynamic changes with symmetric T  wave inversion in leads v3, v5, and 1, and AVL.  Cardiac enzymes (POC) MB  within normal limits, troponin I initially negative with subsequent rise to  peak 0.10, followed by a 0.09 level.   IMPRESSION:  1.  Acute coronary syndrome.  2.  Multiple cardiac risk factors.      1.  Hypertension.      2.  Hypercholesterolemia.      3.  Type 2 diabetes mellitus.      4.  History of tobacco.      5.  Family history.      6.  Age.  3.  Left carotid bruit.   PLAN:  The patient presents with signs and symptoms suggestive of acute  coronary syndrome.  She presents with dynamic electrocardiogram changes and  slight bump in her troponin marker (peak 0.10).  She also continues to have  some mild residual pain.  She presents with multiple cardiac risk factors  and new onset progressive chest discomfort over the last few weeks.  Therefore, the patient is to be stabilized here at [facility] ER and  arrange for immediate transfer to [facility].  She has been started  on heparin and we will switch her from Nitropaste to IV nitroglycerin.  She  has also received four baby aspirin here and we will start her on Lopressor  25 mg q.8h.  Tenoretic will be discontinued but the patient will, otherwise,  continue on her home medication regimen.  We will cycle cardiac markers and  also check a fasting lipid profile and TSH level.  The recommendation is to  proceed with cardiac catheterization tomorrow or sooner if the patient's  symptoms progress.  The patient is agreeable with this plan and risks/benefits have been  discussed.  The patient's presentation and plan of  care have been discussed with Dr. Shawnie Pons.      GS/MEDQ  D:  05/12/2004  T:  05/12/2004  Job:  [id number]

## 2010-05-30 NOTE — Op Note (Signed)
Taylor [name], Taylor [name]               ACCOUNT NO.:  [id number]   MEDICAL RECORD NO.:  [id number]          PATIENT TYPE:  INP   LOCATION:  2302                         FACILITY:  [facility]   PHYSICIAN:  Evelene Croon, M.D.     DATE OF BIRTH:  1946/01/09   DATE OF PROCEDURE:  05/14/2004  DATE OF DISCHARGE:                                 OPERATIVE REPORT   PREOPERATIVE DIAGNOSIS:  Severe three-vessel coronary artery disease with  unstable angina.   POSTOPERATIVE DIAGNOSIS:  Severe three-vessel coronary artery disease with  unstable angina.   OPERATIVE PROCEDURES:  1.  Median sternotomy.  2.  Extracorporeal circulation.  3.  Coronary artery bypass graft surgery x 4 using a free left internal      mammary artery graft to the left anterior descending coronary artery      with a sequential saphenous vein graft to the diagonal branch of the LAD      and the obtuse marginal branch of the left circumflex coronary artery      and a saphenous vein graft to the posterior descending artery branch of      the right coronary artery.  4.  Endoscopic vein harvesting from the right leg.   ATTENDING SURGEON:  Evelene Croon, M.D.   ASSISTANT:  Theda Belfast, P.A.   ANESTHESIA:  General endotracheal.   CLINICAL HISTORY:  This patient is a 65 year old woman with a history of  diabetes, hyperlipidemia and hypertension, as well as remote smoking.  She  was admitted with unstable anginal symptoms.  She ruled in for a non-ST  segment elevation MI.  Cardiac catheterization was performed by Dr. Riley Kill  and showed severe three-vessel disease.  The LAD was occluded at its origin  with filling of the distal vessel by collaterals from the right.  The left  circumflex had a 70% proximal stenosis and gave off a single large marginal  branch.  The right coronary artery had 40% proxima, 30% mid and 50-70%  posterior descending stenosis.  Left ventricular ejection fraction was about  40% with severe hypokinesis  to akinesis of the distal anterior wall and  apex.  There is also about 70% stenosis of the proximal portion of the left  subclavian artery.  There is a large left internal mammary.  There is also  about 80% ostial stenosis of the left vertebral artery.  She did have some  mitral regurgitation noted on her catheterization, but this was felt to most  likely be artifact.  She underwent a preoperative echocardiogram which  showed no significant mitral regurgitation.  After review of the angiogram  and examination of the patient, it was felt that coronary artery bypass  graft surgery was the best treatment.  I discussed the operative procedure  with the patient and her family, including alternatives, benefits and risks,  including bleeding, blood transfusion, infection, stroke, myocardial  infarction, graft failure and death.  They understood and agreed to proceed.   DESCRIPTION OF PROCEDURE:  The patient was taken to the operating room and  placed on the table in the supine position.  After induction of general  endotracheal anesthesia, a Foley catheter was placed in the bladder using  sterile technique.  Then the chest, abdomen and both lower extremities were  prepped and draped in usual sterile manner.  The chest was entered through a  median sternotomy incision and the pericardium opened in the midline.  Examination of the heart showed good ventricular contractility.  The  ascending aorta had no palpable plaques in it.   Then the left internal mammary artery was harvested from the chest wall as a  free graft.  This was a large caliber vessel with excellent blood flow  through it.  At the same time, a segment of greater saphenous vein was  harvested from the right leg using endoscopic vein harvest technique.  This  vein was of large caliber, but had a thin wall and was of good quality.   Then the patient was heparinized and when an adequate activated clotting  time was achieved, the  distal ascending aorta was cannulated using a 20  French aortic cannula for arterial inflow.  Venous outflow was achieved  using a two-stage venous cannula to the right atrial appendage.  An  antegrade cardioplegia and vent cannula were inserted at the aortic root.   The patient was placed on cardiopulmonary bypass and the distal coronary was  identified.  The LAD was a large vessel that was heavily diseased in its  proximal to mid portion.  Distally the vessel was relatively free of disease  and a large graftable vessel.  The diagonal branch was a medium sized  graftable vessel.  The obtuse marginal was a very large vessel with no  significant distal disease in it.  The right coronary artery was diffusely  diseased.  It gave off a medium sized posterior descending branch that was  suitable for grafting.  There were a couple of small posterolateral branches  that were not large enough to graft.   Then the aorta was cross clamped and 1300 mL of cold blood antegrade  cardioplegia were administered in the aortic root with quick arrest of the  heart.  Systemic hypothermia to 20 degrees Centigrade and topical  hypothermia with iced saline were used.  A temperature probe was placed in  the septum and an insulating pad in the pericardium.   The first distal anastomosis was performed to the posterior descending  coronary artery.  The internal diameter was 1.75 mm.  The conduit used was a  segment of greater saphenous vein.  The anastomosis was performed in end to  side manner using continuous 7-0 Prolene suture.  Flow was measured through  the graft and was excellent.   The second distal anastomosis was performed to the diagonal branch.  The  internal diameter was 1.6 mm.  The conduit used was the second segment of  greater saphenous vein and the anastomosis was performed in a sequential side-to-side manner using continuous 7-0 Prolene suture.  Flow was measured  through the graft and was  excellent.   The third distal anastomosis was performed to the obtuse marginal.  The  internal diameter was greater than 2.5 mm.  The conduit used was the same  segment of greater saphenous vein and anastomosis performed in a sequential  end-to-side manner using continuous 7-0 Prolene suture.  Flow was measured  through the graft and was excellent.  Then another dose of cardioplegia was  given down the vein grafts and in the aortic root.   The fourth distal anastomosis  was performed in the mid portion of the left  anterior descending coronary artery.  The internal diameter of this vessel  was about 2.5 mm.  The conduit used was the free left internal mammary graft  and this was anastomosed in an end-to-side manner using continuous 8-0  Prolene suture.  The pedicle was tacked to the epicardium with 6-0 Prolene  sutures.   The patient was then rewarmed to 37 degrees Centigrade.  The two proximal  vein graft anastomoses were performed in the aortic root in an end-to-side  manner using continuous 6-0 Prolene suture.  The proximal anastomosis of the  mammary artery graft was performed to the hood of the obtuse marginal vein  graft in an end-to-side manner using continuous 7-0 Prolene suture.  Then  the clamp was removed from the aorta with a time of 87 minutes.  There was  spontaneous return of sinus rhythm.  The proximal and distal anastomoses  appeared hemostatic and lie of the graft satisfactory.  Graft markers were  placed around the proximal anastomoses.  Two temporary right ventricle and  right atrial pacing wires were placed and brought out through the skin.   When the patient had rewarmed to 37 degrees Centigrade, she was weaned from  cardiopulmonary bypass on no inotropic agents.  The total bypass time was  108 minutes.  Cardiac function appeared excellent with a cardiac output of 6  L/min.  Protamine was given.  The venous and aortic cannulas were removed  without difficulty.   Hemostasis was achieved.  Three chest tubes were placed  with two in the posterior pericardium, one in the left pleural space and one  in anterior mediastinum.  The pericardium was tight and could not be  reapproximated over the heart.  The sternum was closed with #6 stainless  steel wires.  The fascia was closed with continuous #1 Vicryl suture.  The  subcutaneous tissues were closed with continuous 2-0 Vicryl and the skin  with 3-0 Vicryl subcuticular closure.  The lower extremity vein harvest  sites were closed in layers in a similar manner.  The sponge, needle and  instrument counts were correct were correct, according to the scrub nurse.  Dry sterile dressings were applied over the incisions.  The chest tubes were  hooked to Pleur-evac suction.  The patient remained hemodynamically stable  and was transported to the SICU in guarded, but stable condition.     BB/MEDQ  D:  05/14/2004  T:  05/14/2004  Job:  [id number]   cc:   Arturo Morton. Riley Kill, M.D. [facility]   Cardiac Catheterization Laboratory

## 2010-05-30 NOTE — Discharge Summary (Signed)
Taylor [name], Taylor [name]               ACCOUNT NO.:  [id number]   MEDICAL RECORD NO.:  [id number]          PATIENT TYPE:  INP   LOCATION:  2001                         FACILITY:  [facility]   PHYSICIAN:  Evelene Croon, M.D.     DATE OF BIRTH:  Aug 21, 1945   DATE OF ADMISSION:  05/12/2004  DATE OF DISCHARGE:                                 DISCHARGE SUMMARY   ADMISSION DIAGNOSIS:  Chest pain.   PAST MEDICAL HISTORY AND DISCHARGE DIAGNOSES:  1.  Hypertension.  2.  Hypercholesterolemia.  3.  Type 2 diabetes mellitus.  4.  History of tobacco abuse.  5.  Status post cholecystectomy.  6.  Status post bilateral cataract surgery.  7.  Status post right retinal detachment surgery.  8.  Coronary artery disease status post non ST segment elevation myocardial      infarction and status post coronary artery bypass grafting x4.  9.  Postoperative anemia, resolved.  10. Status post bilateral pleural effusion resolved.   ALLERGIES:  No known drug allergies.   BRIEF HISTORY:  The patient is a 65 year old Caucasian female with no prior  cardiac history, but with multiple cardiac risk factors including  hypertension, hypercholesterolemia, type 2 diabetes mellitus,  and  history  of tobacco abuse.  The patient presented to the [facility] Emergency Room  with new onset chest discomfort that she reports has been present for  approximately three weeks.  This was described as mid sternal chest  discomfort associated with bilateral arm pain and occasional radiation to  the teeth.  Her symptoms progressed and worse episode prompted her to  present to the emergency room where she was evaluated by Dr. Riley Kill.  The  patient had no significant associated dyspnea, diaphoresis, or nausea.  She  was evaluated and her initial EKG showed normal sinus rhythm with no acute  changes. However, repeat EKG revealed symmetric T wave inversion in the  precordial and high lateral leads.  Her initial cardiac markers also  revealed an elevated of troponin to 0.10.  She was therefore ruled in for an  non ST segment elevation myocardial infarction and was treated accordingly,  and transferred to [facility] in anticipation of cardiac  catheterization the next day.   HOSPITAL COURSE:  The patient was admitted for chest pain via the emergency  room as previously stated.  She was evaluated by Dr. Shawnie Pons who  admitted the patient and started her on IV nitroglycerin, heparin, Protonix,  and morphine.  The patient remained in stable condition and she was  scheduled for cardiac catheterization on May 13, 2004.  Cardiac  catheterization was performed by Dr. Riley Kill and this revealed severe three-  vessel coronary artery disease.  There was also some mitral regurgitation  noted.  This was felt to be most likely to be artifact.  She underwent a  preoperative echocardiogram which showed no significant mitral  regurgitation.  Dr. Evelene Croon of the CVTS service was consulted regarding  surgical revascularization for this patient.  Dr. Laneta Simmers evaluated the  patient on  May 13, 2004 and it was his  opinion that the patient should  proceed with coronary artery bypass graft surgery.  The patient was  maintained on routine hospital care and surgery was scheduled.   The patient was taken to the OR on  May 14, 2004 for coronary artery bypass  grafting x4.  The free left internal mammary artery was grafted to the LAD,  a sequential saphenous vein was grafted to the diagonal branch of the LAD  and the obtuse marginal of the left circumflex, and saphenous vein was  grafted to the posterior descending artery.  Endoscopic vessel harvesting  was performed on the right lower extremity.  Of note, the left internal  mammary artery was used as a free graft secondary to the discovery of left  subclavian stenosis.  The patient tolerated the procedure well and was  hemodynamically stable immediately postoperatively.  The  patient was  transferred from the OR to the SICU in stable condition. The patient was  extubated without complication and woke up from anesthesia neurologically  intact.   On postoperative day #1, the patient was afebrile with stable vital signs  and maintaining normal sinus rhythm.  She was noted to be anemic with  hemoglobin of 8.2 and was therefore transfused with unit of packed RBCs.  The chest tubes were discontinued in a routine manner and all drips were  weaned accordingly without difficulty.   The remainder of the patient's postoperative course has progressed as  expected.  Her diabetes has been managed quite well with her home dose of  Glucotrol and sliding scale insulin.  She was not under good control  preoperatively secondary to a hemoglobin A1c of 8.1 and this will need to be  addressed by her primary care physician on an outpatient basis.  The patient  has been volume overloaded and has been diuresed accordingly.  On  postoperative day #3, she was noted to have bilateral pleural effusions.  She was diuresed accordingly and these have subsequently improved.  Her  pulmonary toilet has also been aggressively encouraged and she is continuing  to improve with this as well.   The patient was in cardiac rehab on postoperative day #1, and has increased  her tolerance to a satisfactory level at this time.   On postoperative day #5, the patient is without complaint.  She is  ambulating well and her bowel function has returned.  She is afebrile with  stable vital signs and is maintaining a normal sinus rhythm.  She is below  her preoperative weight.   PHYSICAL EXAMINATION:  CARDIAC:  Regular rate and rhythm.  LUNGS:  Reveal crackles in the right base and decreased breath sounds in the  left base.  ABDOMEN:  Benign.  The incisions are clean, dry, and intact, and there is  trace edema present in the bilateral lower extremities.  A repeat chest x-ray will be performed in the  morning of May 20, 2004 for  followup of her bilateral effusions.  These have continued to improve and as  long as they are stable or improving it is possible that she could be  discharged within the next 1-2 days.  Her diabetes mellitus is well  controlled and she was also noted to be hypokalemic on postoperative day #5,  and this has been replaced accordingly.  The only issue at this point is  followup of her bilateral effusions and weaning her oxygen.  She is  currently saturating 93% on two liters and this is being weaned.  As long  as  the patient continues to progress in the current  manner and she can be  weaned off her oxygen she will be ready for discharge within the next 1-2  days pending morning round re-evaluation.   LABORATORY DATA:  BMP on May 19, 2004:  Sodium 136, potassium 3.5, BUN 14,  creatinine 1.0, glucose 125.  CBC on May 18, 2004:  White count 8.3,  hemoglobin 8.9, hematocrit 25.3, platelets 230.   CONDITION ON DISCHARGE:  Improved.   INSTRUCTIONS:   MEDICATIONS:  1.  Aspirin 325 mg p.o. daily.  2.  Toprol XL 25 mg daily.  3.  Zocor 40 mg daily.  4.  Prilosec 20 mg b.i.d.  5.  Glucotrol 10 mg daily.  6.  Lasix 40 mg daily x7 days.  7.  K-Dur 20 mEq daily x7 days.  8.  Nitrorex 150 mg p.o. daily.  9.  Folic acid 1 mg daily.  10. Tylox 1-2 q.4-6h p.r.n. pain.   ACTIVITY:  No driving, no lifting of more than 10 pounds.  The patient  should continue daily breathing and walking exercises.   DIET:  Low salt, low fat, and carbohydrate modified, medium calory.   WOUND CARE:  The patient may shower daily and clean the incisions with soap  and water.  If wound problems arise, the patient should contact the CVTS  office at [phone].   Followup appointment with Dr. Riley Kill.  The patient will be instructed to  call his office for an appointment two weeks after discharge.  A chest x-ray  will be taken at the time and she will be instructed to bring that with her  to  the appointment with Dr. Laneta Simmers.  Dr. Laneta Simmers on Jun 03, 2004 at 12:15.       AY/MEDQ  D:  05/19/2004  T:  05/19/2004  Job:  [id number]

## 2010-10-23 LAB — CBC
Hemoglobin: 13.3
MCHC: 33.4
RBC: 4.46
WBC: 5.3

## 2010-10-23 LAB — BASIC METABOLIC PANEL
Calcium: 10.1
Creatinine, Ser: 0.83
GFR calc Af Amer: >60
GFR calc non Af Amer: >60
Sodium: 143

## 2010-11-13 ENCOUNTER — Encounter: Payer: Self-pay | Admitting: *Deleted

## 2010-11-14 ENCOUNTER — Encounter: Payer: Self-pay | Admitting: Cardiology

## 2010-11-14 ENCOUNTER — Ambulatory Visit (INDEPENDENT_AMBULATORY_CARE_PROVIDER_SITE_OTHER): Payer: 59 | Admitting: Cardiology

## 2010-11-14 DIAGNOSIS — I10 Essential (primary) hypertension: Secondary | ICD-10-CM

## 2010-11-14 DIAGNOSIS — E7800 Pure hypercholesterolemia, unspecified: Secondary | ICD-10-CM

## 2010-11-14 DIAGNOSIS — R079 Chest pain, unspecified: Secondary | ICD-10-CM

## 2010-11-14 DIAGNOSIS — I2510 Atherosclerotic heart disease of native coronary artery without angina pectoris: Secondary | ICD-10-CM

## 2010-11-14 DIAGNOSIS — R0989 Other specified symptoms and signs involving the circulatory and respiratory systems: Secondary | ICD-10-CM

## 2010-11-14 NOTE — Patient Instructions (Signed)
Your physician has requested that you have an exercise tolerance test. For further information please visit [email or web address] Please also follow instruction sheet, as given. With Dr. Riley Kill. Your physician has requested that you have a carotid duplex. This test is an ultrasound of the carotid arteries in your neck. It looks at blood flow through these arteries that supply the brain with blood. Allow one hour for this exam. There are no restrictions or special instructions. Dr. Riley Kill recommends for pt. To call Dr. Loreta Ave for possible gastritis. Your physician recommends that you continue on your current medications as directed. Please refer to the Current Medication list given to you today.

## 2010-11-14 NOTE — Progress Notes (Signed)
HPI:  Taylor [name] is in for a follow up visit.  Since I saw her last, she has had a lot occur. She has had a lot of anxiety, as well as personal stress.   She feels some stress in the chest, but also a lot of heartburn.  She does not take a PPI, and does have an irritable bowel syndrome.  She does eat some chocolate, but is not smoking.  She feels somewhat excessively tired.  She has seen Dr. Tenny Craw, and has had some adjustments of anti-depressants.  She has seen Dr. Ranae Palms in the past.    Current Outpatient Prescriptions  Medication Sig Dispense Refill  . ALPRAZolam (XANAX) 0.25 MG tablet Take 0.25 mg by mouth 3 (three) times daily as needed.        [name] amitriptyline (ELAVIL) 10 MG tablet Take 10 mg by mouth at bedtime.        [name] aspirin 81 MG tablet Take 81 mg by mouth daily.        . carvedilol (COREG) 6.25 MG tablet Take 6.25 mg by mouth 2 (two) times daily with a meal.        . diphenhydramine-acetaminophen (TYLENOL PM EXTRA STRENGTH) 25-500 MG TABS Take 1 tablet by mouth at bedtime as needed.        . ezetimibe-simvastatin (VYTORIN) 10-40 MG per tablet Take 1 tablet by mouth at bedtime.        [name] glimepiride (AMARYL) 2 MG tablet Take 2 mg by mouth daily before breakfast.        . metFORMIN (GLUCOPHAGE-XR) 500 MG 24 hr tablet Take 1,000 mg by mouth 2 (two) times daily.        . sertraline (ZOLOFT) 50 MG tablet Take 50 mg by mouth daily.          Allergies  Allergen Reactions  . Niacin     Past Medical History  Diagnosis Date  . Coronary artery disease   . Hypercholesteremia   . Hypertension   . Diabetes mellitus   . Reflux esophagitis   . Depression     Past Surgical History  Procedure Date  . Coronary artery bypass graft     x4  . Ankle surgery     left  . Cholecystectomy   . Cataract extraction, bilateral   . Right renal detachment surgery   . Eye surgery     right    Family History  Problem Relation Age of Onset  . Coronary artery disease Mother 89    status post  CABG   . Heart attack Father 29    deceased secondary to fatal  first myocardial infarction  . Coronary artery disease Sister 73    alive had bypass surgery    History   Social History  . Marital Status: Married    Spouse Name: N/A    Number of Children: N/A  . Years of Education: N/A   Occupational History  . Not on file.   Social History Main Topics  . Smoking status: Former Games developer  . Smokeless tobacco: Not on file  . Alcohol Use: No  . Drug Use: No  . Sexually Active: Not on file   Other Topics Concern  . Not on file   Social History Narrative  . No narrative on file    ROS: Please see the HPI.  All other systems reviewed and negative.  PHYSICAL EXAM:  BP 149/81  Pulse 62  Resp 18  Ht 5\' 7"  (  1.702 m)  Wt 188 lb 1.9 oz (85.331 kg)  BMI 29.46 kg/m2  General: Well developed, well nourished, in no acute distress. Head:  Normocephalic and atraumatic. Neck: no JVD.  Does have left carotid bruit.   Lungs: Clear to auscultation and percussion. Heart: Normal S1 and S2.  PMI not displaced, and no definite murmur.  Abdomen:  Normal bowel sounds; soft; non tender; no organomegaly Pulses: Pulses normal in all 4 extremities. Extremities: No clubbing or cyanosis. No edema. Neurologic: Alert and oriented x 3.  EKG:  NSR.  Left axis.  Incomplete RBBB.  Nonspecific T wave flattening.   ASSESSMENT AND PLAN:

## 2010-11-30 DIAGNOSIS — R079 Chest pain, unspecified: Secondary | ICD-10-CM | POA: Insufficient documentation

## 2010-11-30 NOTE — Assessment & Plan Note (Signed)
Control seems reasonable, but we will see what happens on the treadmill.

## 2010-11-30 NOTE — Assessment & Plan Note (Signed)
Managed by Dr. Tenny Craw and will defer any decisions.

## 2010-11-30 NOTE — Assessment & Plan Note (Signed)
Recheck doppler.

## 2010-11-30 NOTE — Assessment & Plan Note (Signed)
Symptoms don't necessarily sound cardiac, but it is worthwhile at this point to do a routine GXT to assess her status.

## 2010-11-30 NOTE — Assessment & Plan Note (Signed)
See my CAD note.  Some of her features sound GI in origin.  She might well have GERD, and benefit from chronic PPI therapy, and perhaps an endo.  Would ask her to make an appointment with Dr. Ranae Palms.

## 2010-12-17 ENCOUNTER — Encounter: Payer: 59 | Admitting: Cardiology

## 2010-12-17 ENCOUNTER — Encounter (INDEPENDENT_AMBULATORY_CARE_PROVIDER_SITE_OTHER): Payer: 59 | Admitting: Cardiology

## 2010-12-17 ENCOUNTER — Ambulatory Visit (INDEPENDENT_AMBULATORY_CARE_PROVIDER_SITE_OTHER): Payer: 59 | Admitting: Cardiology

## 2010-12-17 DIAGNOSIS — I6529 Occlusion and stenosis of unspecified carotid artery: Secondary | ICD-10-CM

## 2010-12-17 DIAGNOSIS — R0989 Other specified symptoms and signs involving the circulatory and respiratory systems: Secondary | ICD-10-CM

## 2010-12-17 DIAGNOSIS — I2510 Atherosclerotic heart disease of native coronary artery without angina pectoris: Secondary | ICD-10-CM

## 2010-12-17 NOTE — Patient Instructions (Signed)
Your physician wants you to follow-up in: 6 MONTHS.  You will receive a reminder letter in the mail two months in advance. If you don't receive a letter, please call our office to schedule the follow-up appointment.  Your physician recommends that you continue on your current medications as directed. Please refer to the Current Medication list given to you today.  

## 2010-12-17 NOTE — Progress Notes (Signed)
Exercise Treadmill Test  Pre-Exercise Testing Evaluation Rhythm: normal sinus  Rate: 77   PR:  .15 QRS:  .09    Test  Exercise Tolerance Test Ordering MD: Shawnie Pons, MD  Interpreting MD:  Shawnie Pons, MD  Unique Test No: 1  Treadmill:  1  Indication for ETT: known ASHD  Contraindication to ETT: No   Stress Modality: exercise - treadmill  Cardiac Imaging Performed: non   Protocol: standard Bruce - maximal  Max BP:  208/95  Max MPHR (bpm):  157 85% MPR (bpm):  133  MPHR obtained (bpm):  135 % MPHR obtained:  85%  Reached 85% MPHR (min:sec):  5:00 Total Exercise Time (min-sec):  5:37  Workload in METS:  6.5 mets Borg Scale: 15  Reason ETT Terminated:  fatigue    ST Segment Analysis At Rest: normal ST segments - no evidence of significant ST depression With Exercise: no evidence of significant ST depression  Other Information Arrhythmia:  No Angina during ETT:  absent (0) Quality of ETT:  diagnostic  ETT Interpretation:  normal - no evidence of ischemia by ST analysis  Comments: The patient exercised on the Bruce protocol for just over five minutes.  She does not do much activity.  She had no chest pain or ST depression.  The study was negative for ischemia, but exercise duration was limited, and tolerance poor.    Recommendations: Increase regular exercise.    Shawnie Pons 12/17/2010 3:37 PM

## 2010-12-21 NOTE — Progress Notes (Signed)
Patient ID: Taylor [name], female   DOB: [birth year], 65 y.o.   MRN: [id number]

## 2011-03-06 ENCOUNTER — Other Ambulatory Visit (INDEPENDENT_AMBULATORY_CARE_PROVIDER_SITE_OTHER): Payer: Self-pay | Admitting: Surgery

## 2011-03-06 ENCOUNTER — Encounter (INDEPENDENT_AMBULATORY_CARE_PROVIDER_SITE_OTHER): Payer: Self-pay | Admitting: Surgery

## 2011-03-06 ENCOUNTER — Ambulatory Visit (INDEPENDENT_AMBULATORY_CARE_PROVIDER_SITE_OTHER): Payer: 59 | Admitting: Surgery

## 2011-03-06 VITALS — BP 138/82 | HR 66 | Temp 97.6°F | Resp 18 | Ht 68.0 in | Wt 190.0 lb

## 2011-03-06 DIAGNOSIS — Z1231 Encounter for screening mammogram for malignant neoplasm of breast: Secondary | ICD-10-CM

## 2011-03-06 DIAGNOSIS — Z853 Personal history of malignant neoplasm of breast: Secondary | ICD-10-CM

## 2011-03-06 DIAGNOSIS — Z9012 Acquired absence of left breast and nipple: Secondary | ICD-10-CM

## 2011-03-06 NOTE — Progress Notes (Signed)
[facility]  Ovidio Kin, MD,  FACS [address] [place]  [address] [place], [place]    [address] Phone:  [phone] FAX:  [phone]   Re:   Taylor [name] DOB:   02/09/1945 MRN:   [id number]  ASSESSMENT AND PLAN: 1.  Left breast cancer, high grade DCIS.  Tis, N0.  ER - 99%, PR - 52%  Sees Dr. Cherene Altes.  Decided against tamoxifen.  Left mastectomy 03/21/2009.  Disease free.  See me back in 6 months.  2.  Diabetes Mellitus. 3.  Hypertension. 4.  Coronary artery disease.  HISTORY OF PRESENT ILLNESS: Chief Complaint  Patient presents with  . Breast Cancer Long Term Follow Up    Taylor [name] is a 66 y.o. (DOB: Apr 28, 1945)  white female who is a patient of ROSS,ALLAN, MD, MD and comes to me today for follow up of left breast cancer.  She is doing well.  She has no new complaint.  She struggles with watching her diet for her diabetes.   PHYSICAL EXAM: BP 138/82  Pulse 66  Temp(Src) 97.6 F (36.4 C) (Temporal)  Resp 18  Ht 5\' 8"  (1.727 m)  Wt 190 lb (86.183 kg)  BMI 28.89 kg/m2  HEENT:  Pupils equal.  Dentition good.  No injury. NECK:  Supple.  No thyroid mass. LYMPH NODES:  No cervical, supraclavicular, or axillary adenopathy. BREASTS -  RIGHT:  No palpable mass or nodule.  No nipple discharge.   LEFT:  Left breast absent. No palpable mass or nodule.  No nipple discharge. UPPER EXTREMITIES:  Maybe mild lymphedema of left upper arm.   DATA REVIEWED: Last mammogram - 02/27/2010 - neg. We wrote an order to update her mammogram.   Ovidio Kin, MD, FACS Office:  [phone]

## 2011-03-17 ENCOUNTER — Ambulatory Visit: Payer: 59

## 2011-03-17 ENCOUNTER — Ambulatory Visit
Admission: RE | Admit: 2011-03-17 | Discharge: 2011-03-17 | Disposition: A | Discharge status: Home / Self Care | Payer: 59 | Source: Ambulatory Visit | Attending: Surgery | Admitting: Surgery

## 2011-03-17 DIAGNOSIS — Z1231 Encounter for screening mammogram for malignant neoplasm of breast: Secondary | ICD-10-CM

## 2011-03-17 DIAGNOSIS — Z9012 Acquired absence of left breast and nipple: Secondary | ICD-10-CM

## 2011-04-22 ENCOUNTER — Encounter: Payer: Self-pay | Admitting: Oncology

## 2011-04-22 ENCOUNTER — Telehealth: Payer: Self-pay | Admitting: *Deleted

## 2011-04-22 ENCOUNTER — Other Ambulatory Visit: Payer: Self-pay | Admitting: *Deleted

## 2011-04-22 ENCOUNTER — Ambulatory Visit (HOSPITAL_BASED_OUTPATIENT_CLINIC_OR_DEPARTMENT_OTHER): Payer: 59 | Admitting: Oncology

## 2011-04-22 ENCOUNTER — Other Ambulatory Visit (HOSPITAL_BASED_OUTPATIENT_CLINIC_OR_DEPARTMENT_OTHER): Payer: 59 | Admitting: Lab

## 2011-04-22 VITALS — BP 153/78 | HR 78 | Temp 98.0°F | Ht 68.0 in | Wt 193.8 lb

## 2011-04-22 DIAGNOSIS — D0590 Unspecified type of carcinoma in situ of unspecified breast: Secondary | ICD-10-CM

## 2011-04-22 DIAGNOSIS — Z853 Personal history of malignant neoplasm of breast: Secondary | ICD-10-CM

## 2011-04-22 LAB — CBC WITH DIFFERENTIAL/PLATELET
EOS%: 3.2 % (ref 0.0–7.0)
Eosinophils Absolute: 0.2 10*3/uL (ref 0.0–0.5)
MCV: 90 fL (ref 79.5–101.0)
MONO%: 4.8 % (ref 0.0–14.0)
NEUT#: 3.4 10*3/uL (ref 1.5–6.5)
RBC: 4.33 10*6/uL (ref 3.70–5.45)
RDW: 12.8 % (ref 11.2–14.5)
lymph#: 2.3 10*3/uL (ref 0.9–3.3)
nRBC: 0 % (ref 0–0)

## 2011-04-22 NOTE — Patient Instructions (Signed)
1. You are doing well, clinically no evidence of cancer  2. I will continue to see you once a year

## 2011-04-22 NOTE — Telephone Encounter (Signed)
gave patient appointment for 04-2012 printed out calendar and gave to the patient 

## 2011-04-22 NOTE — Progress Notes (Signed)
OFFICE PROGRESS NOTE  CC  ROSS,ALLAN, MD, MD [address] [place] [address] Dr. Ovidio Kin Dr. Leda Quail  DIAGNOSIS: 66 year old female with high-grade DCIS of the left breast that was ER +99% PR +52%. Patient is status post left breast lumpectomy on 03/21/2009.  PRIOR THERAPY:  #1 patient presented with a high-grade DCIS she underwent a left breast lumpectomy that showed a DCIS that was ER positive PR positive.  #2 patient declined any chemopreventive therapy for the contralateral breast.  CURRENT THERAPY:Observation  INTERVAL HISTORY: Taylor [name] 66 y.o. female returns for Followup visit today. Overall she is doing well she is without any complaints. She has little bit of lymphedema but otherwise no other  a problems. She continues to be followed by Dr. Ezzard Standing on a regular basis. She denies any fevers chills night sweats headaches shortness of breath chest pains palpitations no myalgias or arthralgias. Remainder of the 10 point review of systems is negative.  MEDICAL HISTORY: Past Medical History  Diagnosis Date  . Coronary artery disease   . Hypercholesteremia   . Hypertension   . Diabetes mellitus   . Reflux esophagitis   . Depression   . Cancer   . GERD (gastroesophageal reflux disease)   . Acute MI     ALLERGIES:  is allergic to niacin.  MEDICATIONS:  Current Outpatient Prescriptions  Medication Sig Dispense Refill  . ALPRAZolam (XANAX) 0.25 MG tablet Take 0.25 mg by mouth 3 (three) times daily as needed.        [name] amitriptyline (ELAVIL) 10 MG tablet Take 10 mg by mouth at bedtime.        [name] aspirin 81 MG tablet Take 81 mg by mouth daily.        . carvedilol (COREG) 6.25 MG tablet Take 6.25 mg by mouth 2 (two) times daily with a meal.        . diphenhydramine-acetaminophen (TYLENOL PM EXTRA STRENGTH) 25-500 MG TABS Take 1 tablet by mouth at bedtime as needed.        . ezetimibe-simvastatin (VYTORIN) 10-40 MG per tablet Take 1  tablet by mouth at bedtime.        [name] glimepiride (AMARYL) 2 MG tablet Take 2 mg by mouth at bedtime.       . metFORMIN (GLUCOPHAGE-XR) 500 MG 24 hr tablet Take 1,000 mg by mouth 2 (two) times daily.        . sertraline (ZOLOFT) 50 MG tablet Take 50 mg by mouth daily.          SURGICAL HISTORY:  Past Surgical History  Procedure Date  . Coronary artery bypass graft     x4  . Ankle surgery     left  . Cholecystectomy   . Cataract extraction, bilateral   . Right renal detachment surgery   . Eye surgery     right    REVIEW OF SYSTEMS:  Pertinent items are noted in HPI.   PHYSICAL EXAMINATION: General appearance: alert, cooperative and appears stated age Lymph nodes: Cervical, supraclavicular, and axillary nodes normal. Resp: clear to auscultation bilaterally and normal percussion bilaterally Back: symmetric, no curvature. ROM normal. No CVA tenderness. Cardio: regular rate and rhythm, S1, S2 normal, no murmur, click, rub or gallop GI: soft, non-tender; bowel sounds normal; no masses,  no organomegaly Extremities: extremities normal, atraumatic, no cyanosis or edema Neurologic: Grossly normal Right breast no masses nipple discharge. Left mastectomy site looks great no nodularity or evidence of local recurrence  ECOG PERFORMANCE STATUS: 0 - Asymptomatic  Blood pressure 153/78, pulse 78, temperature 98 F (36.7 C), temperature source Oral, height 5\' 8"  (1.727 m), weight 193 lb 12.8 oz (87.907 kg).  LABORATORY DATA: Lab Results  Component Value Date   WBC 6.2 04/22/2011   HGB 13.2 04/22/2011   HCT 39.0 04/22/2011   MCV 90.0 04/22/2011   PLT 255 04/22/2011      Chemistry      Component Value Date/Time   NA 137 04/09/2010 1240   K 4.2 04/09/2010 1240   CL 103 04/09/2010 1240   CO2 23 04/09/2010 1240   BUN 13 04/09/2010 1240   CREATININE 0.91 04/09/2010 1240      Component Value Date/Time   CALCIUM 9.5 04/09/2010 1240   ALKPHOS 55 04/09/2010 1240   AST 20 04/09/2010 1240   ALT 16  04/09/2010 1240   BILITOT 0.6 04/09/2010 1240       RADIOGRAPHIC STUDIES:  No results found.  ASSESSMENT: 66 year old female with high-grade DCIS that was ER +99% PR +52%. Patient underwent a left mastectomy in March 2011. Overall she has done well and she has no evidence of local recurrence. Her mammograms are up-to-date for her right breast.   PLAN: I will continue to see the patient on a yearly basis. Her next visit we'll be in April 2014   All questions were answered. The patient knows to call the clinic with any problems, questions or concerns. We can certainly see the patient much sooner if necessary.  I spent 20 minutes counseling the patient face to face. The total time spent in the appointment was 30 minutes.    Drue Second, MD Medical/Oncology [facility] [phone] (beeper) [phone] (Office)  04/22/2011, 3:23 PM

## 2011-04-23 LAB — COMPREHENSIVE METABOLIC PANEL
Albumin: 4.2 g/dL (ref 3.5–5.2)
Alkaline Phosphatase: 60 U/L (ref 39–117)
BUN: 11 mg/dL (ref 6–23)
CO2: 22 mEq/L (ref 19–32)
Calcium: 9.9 mg/dL (ref 8.4–10.5)
Chloride: 102 mEq/L (ref 96–112)
Glucose, Bld: 269 mg/dL — ABNORMAL HIGH (ref 70–99)
Potassium: 4.2 mEq/L (ref 3.5–5.3)
Sodium: 138 mEq/L (ref 135–145)
Total Protein: 7.1 g/dL (ref 6.0–8.3)

## 2011-06-02 ENCOUNTER — Encounter (INDEPENDENT_AMBULATORY_CARE_PROVIDER_SITE_OTHER): Payer: 59 | Admitting: Ophthalmology

## 2011-06-02 DIAGNOSIS — E1139 Type 2 diabetes mellitus with other diabetic ophthalmic complication: Secondary | ICD-10-CM

## 2011-06-02 DIAGNOSIS — I10 Essential (primary) hypertension: Secondary | ICD-10-CM

## 2011-06-02 DIAGNOSIS — H33009 Unspecified retinal detachment with retinal break, unspecified eye: Secondary | ICD-10-CM

## 2011-06-02 DIAGNOSIS — H35379 Puckering of macula, unspecified eye: Secondary | ICD-10-CM

## 2011-06-02 DIAGNOSIS — H35039 Hypertensive retinopathy, unspecified eye: Secondary | ICD-10-CM

## 2011-06-02 DIAGNOSIS — D3130 Benign neoplasm of unspecified choroid: Secondary | ICD-10-CM

## 2011-06-02 DIAGNOSIS — E11319 Type 2 diabetes mellitus with unspecified diabetic retinopathy without macular edema: Secondary | ICD-10-CM

## 2011-06-03 ENCOUNTER — Encounter (INDEPENDENT_AMBULATORY_CARE_PROVIDER_SITE_OTHER): Payer: 59 | Admitting: Ophthalmology

## 2011-08-05 ENCOUNTER — Encounter (INDEPENDENT_AMBULATORY_CARE_PROVIDER_SITE_OTHER): Payer: Self-pay | Admitting: Surgery

## 2011-10-15 ENCOUNTER — Ambulatory Visit (INDEPENDENT_AMBULATORY_CARE_PROVIDER_SITE_OTHER): Payer: 59 | Admitting: Surgery

## 2011-10-15 ENCOUNTER — Encounter (INDEPENDENT_AMBULATORY_CARE_PROVIDER_SITE_OTHER): Payer: Self-pay | Admitting: Surgery

## 2011-10-15 VITALS — BP 132/80 | HR 72 | Temp 98.0°F | Resp 18 | Ht 67.0 in | Wt 189.6 lb

## 2011-10-15 DIAGNOSIS — Z853 Personal history of malignant neoplasm of breast: Secondary | ICD-10-CM

## 2011-10-15 NOTE — Progress Notes (Signed)
[facility]  Ovidio Kin, MD,  FACS [address] [place]  [address] [place], [place]    [address] Phone:  [phone] FAX:  [phone]   Re:   Taylor [name] DOB:   1945-04-08 MRN:   [id number]  ASSESSMENT AND PLAN: 1.  Left breast cancer, high grade DCIS.  Tis, N0.  ER - 99%, PR - 52%  Sees Dr. Cherene Altes.  Decided against tamoxifen.  Left mastectomy 03/21/2009.  Disease free.  See me back in 6 months.  2.  Mild left arm lymphedema.  Unchanged.  3.  Diabetes Mellitus. 4.  Hypertension. 5.  Coronary artery disease.  HISTORY OF PRESENT ILLNESS: Chief Complaint  Patient presents with  . Breast Cancer Long Term Follow Up    Left br   Taylor [name] is a 66 y.o. (DOB: [birth year])  white female who is a patient of ROSS,ALLAN, MD and comes to me today for follow up of left breast cancer.  She is doing well.  She has no new complaint.  She has had a busy summer.  PHYSICAL EXAM: BP 132/80  Pulse 72  Temp 98 F (36.7 C) (Oral)  Resp 18  Ht 5\' 7"  (1.702 m)  Wt 189 lb 9.6 oz (86.002 kg)  BMI 29.70 kg/m2  HEENT:  Pupils equal.  Dentition good.  No injury. NECK:  Supple.  No thyroid mass. LYMPH NODES:  No cervical, supraclavicular, or axillary adenopathy. BREASTS -  RIGHT:  No palpable mass or nodule.  No nipple discharge.   LEFT:  Left breast absent. No palpable mass or nodule.  No nipple discharge. UPPER EXTREMITIES:  I don't appreciate much lymphedema of left upper arm today.  DATA REVIEWED: Last mammogram - 03/17/2011 - neg.   Ovidio Kin, MD, FACS Office:  [phone]

## 2012-02-14 ENCOUNTER — Encounter (HOSPITAL_COMMUNITY): Payer: Self-pay | Admitting: Emergency Medicine

## 2012-02-14 ENCOUNTER — Emergency Department (HOSPITAL_COMMUNITY)
Admission: EM | Admit: 2012-02-14 | Discharge: 2012-02-14 | Disposition: A | Discharge status: Home / Self Care | Payer: Medicare Other | Attending: Emergency Medicine | Admitting: Emergency Medicine

## 2012-02-14 DIAGNOSIS — Z87891 Personal history of nicotine dependence: Secondary | ICD-10-CM | POA: Insufficient documentation

## 2012-02-14 DIAGNOSIS — E7800 Pure hypercholesterolemia, unspecified: Secondary | ICD-10-CM | POA: Insufficient documentation

## 2012-02-14 DIAGNOSIS — Z859 Personal history of malignant neoplasm, unspecified: Secondary | ICD-10-CM | POA: Diagnosis not present

## 2012-02-14 DIAGNOSIS — Z7982 Long term (current) use of aspirin: Secondary | ICD-10-CM | POA: Insufficient documentation

## 2012-02-14 DIAGNOSIS — F3289 Other specified depressive episodes: Secondary | ICD-10-CM | POA: Insufficient documentation

## 2012-02-14 DIAGNOSIS — Z8679 Personal history of other diseases of the circulatory system: Secondary | ICD-10-CM | POA: Diagnosis not present

## 2012-02-14 DIAGNOSIS — M7989 Other specified soft tissue disorders: Secondary | ICD-10-CM | POA: Diagnosis not present

## 2012-02-14 DIAGNOSIS — Z951 Presence of aortocoronary bypass graft: Secondary | ICD-10-CM | POA: Insufficient documentation

## 2012-02-14 DIAGNOSIS — I8000 Phlebitis and thrombophlebitis of superficial vessels of unspecified lower extremity: Secondary | ICD-10-CM | POA: Diagnosis not present

## 2012-02-14 DIAGNOSIS — M79609 Pain in unspecified limb: Secondary | ICD-10-CM | POA: Diagnosis not present

## 2012-02-14 DIAGNOSIS — Z8719 Personal history of other diseases of the digestive system: Secondary | ICD-10-CM | POA: Insufficient documentation

## 2012-02-14 DIAGNOSIS — F329 Major depressive disorder, single episode, unspecified: Secondary | ICD-10-CM | POA: Insufficient documentation

## 2012-02-14 DIAGNOSIS — I2510 Atherosclerotic heart disease of native coronary artery without angina pectoris: Secondary | ICD-10-CM | POA: Insufficient documentation

## 2012-02-14 DIAGNOSIS — E119 Type 2 diabetes mellitus without complications: Secondary | ICD-10-CM | POA: Insufficient documentation

## 2012-02-14 DIAGNOSIS — I10 Essential (primary) hypertension: Secondary | ICD-10-CM | POA: Insufficient documentation

## 2012-02-14 DIAGNOSIS — Z79899 Other long term (current) drug therapy: Secondary | ICD-10-CM | POA: Insufficient documentation

## 2012-02-14 DIAGNOSIS — I809 Phlebitis and thrombophlebitis of unspecified site: Secondary | ICD-10-CM

## 2012-02-14 LAB — CBC
HCT: 39.5 % (ref 36.0–46.0)
MCHC: 33.9 g/dL (ref 30.0–36.0)
MCV: 89.2 fL (ref 78.0–100.0)
RDW: 12.4 % (ref 11.5–15.5)

## 2012-02-14 LAB — POCT I-STAT, CHEM 8
Calcium, Ion: 1.21 mmol/L (ref 1.13–1.30)
Chloride: 106 mEq/L (ref 96–112)
Creatinine, Ser: 0.9 mg/dL (ref 0.50–1.10)
Glucose, Bld: 162 mg/dL — ABNORMAL HIGH (ref 70–99)
HCT: 39 % (ref 36.0–46.0)

## 2012-02-14 MED ORDER — IBUPROFEN 800 MG PO TABS: 800.0000 mg | ORAL_TABLET | Freq: Three times a day (TID) | ORAL | Status: DC

## 2012-02-14 MED ORDER — IBUPROFEN 800 MG PO TABS
800.0000 mg | ORAL_TABLET | Freq: Once | ORAL | Status: AC
  Administered 2012-02-14: 800 mg via ORAL
  Filled 2012-02-14: qty 1

## 2012-02-14 MED ORDER — ACETAMINOPHEN 500 MG PO TABS
1000.0000 mg | ORAL_TABLET | Freq: Once | ORAL | Status: AC
  Administered 2012-02-14: 1000 mg via ORAL
  Filled 2012-02-14: qty 2

## 2012-02-14 NOTE — ED Notes (Signed)
Vital signs stable and GCS 15.Pt discharged home with her husband.

## 2012-02-14 NOTE — Progress Notes (Signed)
VASCULAR LAB PRELIMINARY  PRELIMINARY  PRELIMINARY  PRELIMINARY  Left lower extremity venous Doppler completed.    Preliminary report:  There is no DVT noted in the left lower extremity.  There is superficial thrombosis noted in the left greater saphenous vein from the ankle to the mid thigh.  Demarea Lorey, RVT 02/14/2012, 6:34 PM

## 2012-02-14 NOTE — ED Provider Notes (Addendum)
History     CSN: [id number]  Arrival date & time 02/14/12  1645   First MD Initiated Contact with Patient 02/14/12 1653      Chief Complaint  Patient presents with  . Leg Pain    (Consider location/radiation/quality/duration/timing/severity/associated sxs/prior treatment) HPI Complains of left lower extremity pain and swelling at distal posterior thigh and proximal calf gradual onset 3 days ago. Pain is worse with movement improved with remaining still. No chest pain no shortness of breath no fever no other complaint no treatment prior to coming here no other associated symptoms . Pain is mild to moderate at present. Past Medical History  Diagnosis Date  . Coronary artery disease   . Hypercholesteremia   . Hypertension   . Diabetes mellitus   . Reflux esophagitis   . Depression   . Cancer   . GERD (gastroesophageal reflux disease)   . Acute MI     Past Surgical History  Procedure Date  . Coronary artery bypass graft     x4  . Ankle surgery     left  . Cholecystectomy   . Cataract extraction, bilateral   . Right renal detachment surgery   . Eye surgery     right    Family History  Problem Relation Age of Onset  . Coronary artery disease Mother 58    status post CABG   . Heart attack Father 95    deceased secondary to fatal  first myocardial infarction  . Coronary artery disease Sister 3    alive had bypass surgery  . Cancer Sister     History  Substance Use Topics  . Smoking status: Former Smoker    Quit date: 01/13/2004  . Smokeless tobacco: Not on file  . Alcohol Use: No    OB History    Grav Para Term Preterm Abortions TAB SAB Ect Mult Living                  Review of Systems  Constitutional: Negative.   HENT: Negative.   Respiratory: Negative.   Cardiovascular: Positive for leg swelling.       Of lower extremity swelling  Gastrointestinal: Negative.   Musculoskeletal: Negative.   Skin: Negative.   Neurological: Negative.    Hematological: Negative.   Psychiatric/Behavioral: Negative.     Allergies  Niacin  Home Medications   Current Outpatient Rx  Name  Route  Sig  Dispense  Refill  . ALPRAZOLAM 0.25 MG PO TABS   Oral   Take 0.25 mg by mouth 3 (three) times daily as needed.           [name] AMITRIPTYLINE HCL 10 MG PO TABS   Oral   Take 10 mg by mouth at bedtime.           . ASPIRIN 81 MG PO TABS   Oral   Take 81 mg by mouth daily.           [name] CARVEDILOL 6.25 MG PO TABS   Oral   Take 6.25 mg by mouth 2 (two) times daily with a meal.           . DIPHENHYDRAMINE-APAP (SLEEP) 25-500 MG PO TABS   Oral   Take 1 tablet by mouth at bedtime as needed.           [name] EZETIMIBE-SIMVASTATIN 10-40 MG PO TABS   Oral   Take 1 tablet by mouth at bedtime.           [name]  GLIMEPIRIDE 2 MG PO TABS   Oral   Take 2 mg by mouth at bedtime.          [name] METFORMIN HCL ER 500 MG PO TB24   Oral   Take 1,000 mg by mouth 2 (two) times daily.           . SERTRALINE HCL 50 MG PO TABS   Oral   Take 50 mg by mouth daily.             BP 192/95  Pulse 100  Temp 98.2 F (36.8 C) (Oral)  Resp 22  SpO2 95%  Physical Exam  Nursing note and vitals reviewed. Constitutional: She appears well-developed and well-nourished.  HENT:  Head: Normocephalic and atraumatic.  Eyes: Conjunctivae normal are normal. Pupils are equal, round, and reactive to light.  Neck: Neck supple. No tracheal deviation present. No thyromegaly present.  Cardiovascular: Normal rate and regular rhythm.   No murmur heard. Pulmonary/Chest: Effort normal and breath sounds normal.  Abdominal: Soft. Bowel sounds are normal. She exhibits no distension. There is no tenderness.  Musculoskeletal: Normal range of motion. She exhibits no edema and no tenderness.       Left lower extremity red streak at distal posterior thigh and proximal leg which is tender. Approximately 20 cm in length. DP pulse 2+ no inguinal nodes. All other Extremities no  redness or tenderness neurovascularly intact  Neurological: She is alert. Coordination normal.  Skin: Skin is warm and dry. No rash noted.  Psychiatric: She has a normal mood and affect.    ED Course  Procedures (including critical care time)  Labs Reviewed - No data to display No results found.  No diagnosis found. Venous Doppler of the lower extremity shows superficial thrombophlebitis of greater saphenous vein. Results for orders placed during the hospital encounter of 02/14/12  CBC      Component Value Range   WBC 8.5  4.0 - 10.5 K/uL   RBC 4.43  3.87 - 5.11 MIL/uL   Hemoglobin 13.4  12.0 - 15.0 g/dL   HCT 19.1  47.8 - 29.5 %   MCV 89.2  78.0 - 100.0 fL   MCH 30.2  26.0 - 34.0 pg   MCHC 33.9  30.0 - 36.0 g/dL   RDW 62.1  30.8 - 65.7 %   Platelets 251  150 - 400 K/uL  POCT I-STAT, CHEM 8      Component Value Range   Sodium 143  135 - 145 mEq/L   Potassium 3.7  3.5 - 5.1 mEq/L   Chloride 106  96 - 112 mEq/L   BUN 11  6 - 23 mg/dL   Creatinine, Ser 8.46  0.50 - 1.10 mg/dL   Glucose, Bld 962 (*) 70 - 99 mg/dL   Calcium, Ion 9.52  8.41 - 1.30 mmol/L   TCO2 24  0 - 100 mmol/L   Hemoglobin 13.3  12.0 - 15.0 g/dL   HCT 32.4  40.1 - 02.7 %   No results found. 8  5 PM pain improved after treatment with Tylenol  MDM  Plan ibuprofen, warm compresses planned recheck with Dr. Tenny Craw this week  Dx #1 superficial thrombophlebitis of left lower extremity #2 hyperglycemia      Doug Sou, MD 02/14/12 2051  Doug Sou, MD 02/15/12 2536

## 2012-02-14 NOTE — ED Notes (Signed)
Onset 2-3 days ago sudden onset of posterior leg pain behind left knee and lower left thigh.  Seen at an urgent care today sent to ED for evaluation. Pain currently 8/10 achy throbbing.

## 2012-02-17 DIAGNOSIS — F411 Generalized anxiety disorder: Secondary | ICD-10-CM | POA: Diagnosis not present

## 2012-02-17 DIAGNOSIS — I10 Essential (primary) hypertension: Secondary | ICD-10-CM | POA: Diagnosis not present

## 2012-02-17 DIAGNOSIS — E7800 Pure hypercholesterolemia, unspecified: Secondary | ICD-10-CM | POA: Diagnosis not present

## 2012-02-17 DIAGNOSIS — I8000 Phlebitis and thrombophlebitis of superficial vessels of unspecified lower extremity: Secondary | ICD-10-CM | POA: Diagnosis not present

## 2012-02-29 ENCOUNTER — Inpatient Hospital Stay (HOSPITAL_COMMUNITY)
Admission: EM | Admit: 2012-02-29 | Discharge: 2012-03-04 | DRG: 176 | Disposition: A | Discharge status: Home Health | Payer: Medicare Other | Attending: Internal Medicine | Admitting: Internal Medicine

## 2012-02-29 ENCOUNTER — Encounter (HOSPITAL_COMMUNITY): Payer: Self-pay | Admitting: *Deleted

## 2012-02-29 ENCOUNTER — Emergency Department (HOSPITAL_COMMUNITY): Payer: Medicare Other

## 2012-02-29 DIAGNOSIS — Z853 Personal history of malignant neoplasm of breast: Secondary | ICD-10-CM

## 2012-02-29 DIAGNOSIS — R791 Abnormal coagulation profile: Secondary | ICD-10-CM | POA: Diagnosis not present

## 2012-02-29 DIAGNOSIS — I739 Peripheral vascular disease, unspecified: Secondary | ICD-10-CM

## 2012-02-29 DIAGNOSIS — F329 Major depressive disorder, single episode, unspecified: Secondary | ICD-10-CM

## 2012-02-29 DIAGNOSIS — F3289 Other specified depressive episodes: Secondary | ICD-10-CM

## 2012-02-29 DIAGNOSIS — R609 Edema, unspecified: Secondary | ICD-10-CM

## 2012-02-29 DIAGNOSIS — I951 Orthostatic hypotension: Secondary | ICD-10-CM | POA: Diagnosis not present

## 2012-02-29 DIAGNOSIS — E1065 Type 1 diabetes mellitus with hyperglycemia: Secondary | ICD-10-CM | POA: Diagnosis present

## 2012-02-29 DIAGNOSIS — I252 Old myocardial infarction: Secondary | ICD-10-CM | POA: Diagnosis not present

## 2012-02-29 DIAGNOSIS — I214 Non-ST elevation (NSTEMI) myocardial infarction: Secondary | ICD-10-CM | POA: Diagnosis not present

## 2012-02-29 DIAGNOSIS — Z87891 Personal history of nicotine dependence: Secondary | ICD-10-CM

## 2012-02-29 DIAGNOSIS — R7989 Other specified abnormal findings of blood chemistry: Secondary | ICD-10-CM

## 2012-02-29 DIAGNOSIS — Z951 Presence of aortocoronary bypass graft: Secondary | ICD-10-CM

## 2012-02-29 DIAGNOSIS — R51 Headache: Secondary | ICD-10-CM | POA: Diagnosis not present

## 2012-02-29 DIAGNOSIS — I872 Venous insufficiency (chronic) (peripheral): Secondary | ICD-10-CM

## 2012-02-29 DIAGNOSIS — I798 Other disorders of arteries, arterioles and capillaries in diseases classified elsewhere: Secondary | ICD-10-CM | POA: Diagnosis present

## 2012-02-29 DIAGNOSIS — R748 Abnormal levels of other serum enzymes: Secondary | ICD-10-CM

## 2012-02-29 DIAGNOSIS — D72829 Elevated white blood cell count, unspecified: Secondary | ICD-10-CM | POA: Diagnosis not present

## 2012-02-29 DIAGNOSIS — Z79899 Other long term (current) drug therapy: Secondary | ICD-10-CM

## 2012-02-29 DIAGNOSIS — R079 Chest pain, unspecified: Secondary | ICD-10-CM

## 2012-02-29 DIAGNOSIS — Z7982 Long term (current) use of aspirin: Secondary | ICD-10-CM

## 2012-02-29 DIAGNOSIS — E1059 Type 1 diabetes mellitus with other circulatory complications: Secondary | ICD-10-CM | POA: Diagnosis present

## 2012-02-29 DIAGNOSIS — R0989 Other specified symptoms and signs involving the circulatory and respiratory systems: Secondary | ICD-10-CM

## 2012-02-29 DIAGNOSIS — E7800 Pure hypercholesterolemia, unspecified: Secondary | ICD-10-CM

## 2012-02-29 DIAGNOSIS — K219 Gastro-esophageal reflux disease without esophagitis: Secondary | ICD-10-CM | POA: Diagnosis present

## 2012-02-29 DIAGNOSIS — I5032 Chronic diastolic (congestive) heart failure: Secondary | ICD-10-CM | POA: Diagnosis not present

## 2012-02-29 DIAGNOSIS — I2699 Other pulmonary embolism without acute cor pulmonale: Principal | ICD-10-CM

## 2012-02-29 DIAGNOSIS — I10 Essential (primary) hypertension: Secondary | ICD-10-CM | POA: Diagnosis present

## 2012-02-29 DIAGNOSIS — R1110 Vomiting, unspecified: Secondary | ICD-10-CM

## 2012-02-29 DIAGNOSIS — I2510 Atherosclerotic heart disease of native coronary artery without angina pectoris: Secondary | ICD-10-CM

## 2012-02-29 DIAGNOSIS — I059 Rheumatic mitral valve disease, unspecified: Secondary | ICD-10-CM | POA: Diagnosis not present

## 2012-02-29 DIAGNOSIS — IMO0002 Reserved for concepts with insufficient information to code with codable children: Secondary | ICD-10-CM

## 2012-02-29 DIAGNOSIS — I25810 Atherosclerosis of coronary artery bypass graft(s) without angina pectoris: Secondary | ICD-10-CM

## 2012-02-29 DIAGNOSIS — E1051 Type 1 diabetes mellitus with diabetic peripheral angiopathy without gangrene: Secondary | ICD-10-CM

## 2012-02-29 DIAGNOSIS — K2100 Gastro-esophageal reflux disease with esophagitis, without bleeding: Secondary | ICD-10-CM

## 2012-02-29 LAB — URINE MICROSCOPIC-ADD ON

## 2012-02-29 LAB — CSF CELL COUNT WITH DIFFERENTIAL
RBC Count, CSF: 29 /mm3 — ABNORMAL HIGH
RBC Count, CSF: 1 /mm3 — ABNORMAL HIGH
Tube #: 4
WBC, CSF: 2 /mm3 (ref 0–5)

## 2012-02-29 LAB — PROTIME-INR
INR: 1.37 (ref 0.00–1.49)
Prothrombin Time: 16.5 seconds — ABNORMAL HIGH (ref 11.6–15.2)

## 2012-02-29 LAB — CBC WITH DIFFERENTIAL/PLATELET
Basophils Absolute: 0 10*3/uL (ref 0.0–0.1)
Eosinophils Absolute: 0.8 10*3/uL — ABNORMAL HIGH (ref 0.0–0.7)
HCT: 36.8 % (ref 36.0–46.0)
Lymphocytes Relative: 5 % — ABNORMAL LOW (ref 12–46)
Lymphs Abs: 1.3 10*3/uL (ref 0.7–4.0)
MCH: 30.2 pg (ref 26.0–34.0)
MCHC: 35.3 g/dL (ref 30.0–36.0)
Monocytes Absolute: 1.8 10*3/uL — ABNORMAL HIGH (ref 0.1–1.0)
Neutro Abs: 22.5 10*3/uL — ABNORMAL HIGH (ref 1.7–7.7)
RDW: 12.6 % (ref 11.5–15.5)

## 2012-02-29 LAB — POCT I-STAT TROPONIN I

## 2012-02-29 LAB — URINALYSIS, ROUTINE W REFLEX MICROSCOPIC
Glucose, UA: NEGATIVE mg/dL
Leukocytes, UA: NEGATIVE
Nitrite: NEGATIVE
Specific Gravity, Urine: 1.022 (ref 1.005–1.030)
pH: 5 (ref 5.0–8.0)

## 2012-02-29 LAB — TROPONIN I
Troponin I: 1.68 ng/mL (ref <0.30)
Troponin I: 1.58 ng/mL (ref <0.30)

## 2012-02-29 LAB — COMPREHENSIVE METABOLIC PANEL
Albumin: 3.7 g/dL (ref 3.5–5.2)
BUN: 21 mg/dL (ref 6–23)
Calcium: 9.1 mg/dL (ref 8.4–10.5)
Creatinine, Ser: 1.28 mg/dL — ABNORMAL HIGH (ref 0.50–1.10)
GFR calc Af Amer: 50 mL/min — ABNORMAL LOW (ref >90)
Potassium: 3.8 mEq/L (ref 3.5–5.1)
Total Protein: 7.9 g/dL (ref 6.0–8.3)

## 2012-02-29 LAB — ANTITHROMBIN III: AntiThromb III Func: 92 % (ref 75–120)

## 2012-02-29 LAB — GRAM STAIN

## 2012-02-29 MED ORDER — CARVEDILOL 6.25 MG PO TABS
6.2500 mg | ORAL_TABLET | Freq: Two times a day (BID) | ORAL | Status: DC
  Administered 2012-03-01 – 2012-03-03 (×5): 6.25 mg via ORAL
  Filled 2012-02-29 (×7): qty 1

## 2012-02-29 MED ORDER — INSULIN ASPART 100 UNIT/ML ~~LOC~~ SOLN
0.0000 [IU] | Freq: Three times a day (TID) | SUBCUTANEOUS | Status: DC
  Administered 2012-03-01: 5 [IU] via SUBCUTANEOUS
  Administered 2012-03-01 (×2): 3 [IU] via SUBCUTANEOUS
  Administered 2012-03-02: 5 [IU] via SUBCUTANEOUS
  Administered 2012-03-02 (×2): 3 [IU] via SUBCUTANEOUS
  Administered 2012-03-03: 2 [IU] via SUBCUTANEOUS
  Administered 2012-03-03: 5 [IU] via SUBCUTANEOUS
  Administered 2012-03-03: 2 [IU] via SUBCUTANEOUS
  Administered 2012-03-04: 3 [IU] via SUBCUTANEOUS
  Administered 2012-03-04: 2 [IU] via SUBCUTANEOUS

## 2012-02-29 MED ORDER — MORPHINE SULFATE 4 MG/ML IJ SOLN
4.0000 mg | Freq: Once | INTRAMUSCULAR | Status: AC
  Administered 2012-02-29: 4 mg via INTRAVENOUS
  Filled 2012-02-29: qty 1

## 2012-02-29 MED ORDER — ONDANSETRON 4 MG PO TBDP
ORAL_TABLET | ORAL | Status: AC
  Filled 2012-02-29: qty 2

## 2012-02-29 MED ORDER — PROMETHAZINE HCL 25 MG/ML IJ SOLN
25.0000 mg | Freq: Once | INTRAMUSCULAR | Status: AC
  Administered 2012-02-29: 25 mg via INTRAVENOUS
  Filled 2012-02-29: qty 1

## 2012-02-29 MED ORDER — DIPHENHYDRAMINE HCL 50 MG/ML IJ SOLN: 25.0000 mg | Freq: Once | INTRAMUSCULAR | Status: DC

## 2012-02-29 MED ORDER — SERTRALINE HCL 50 MG PO TABS
50.0000 mg | ORAL_TABLET | Freq: Every day | ORAL | Status: DC
  Administered 2012-02-29 – 2012-03-04 (×5): 50 mg via ORAL
  Filled 2012-02-29 (×5): qty 1

## 2012-02-29 MED ORDER — IOHEXOL 350 MG/ML SOLN
100.0000 mL | Freq: Once | INTRAVENOUS | Status: AC | PRN
  Administered 2012-02-29: 100 mL via INTRAVENOUS

## 2012-02-29 MED ORDER — DIPHENHYDRAMINE HCL 50 MG/ML IJ SOLN
12.5000 mg | Freq: Once | INTRAMUSCULAR | Status: AC
  Administered 2012-02-29: 12.5 mg via INTRAVENOUS
  Filled 2012-02-29: qty 1

## 2012-02-29 MED ORDER — ONDANSETRON 4 MG PO TBDP
8.0000 mg | ORAL_TABLET | Freq: Once | ORAL | Status: AC
  Administered 2012-02-29: 8 mg via ORAL

## 2012-02-29 MED ORDER — SODIUM CHLORIDE 0.9 % IJ SOLN
3.0000 mL | Freq: Two times a day (BID) | INTRAMUSCULAR | Status: DC
  Administered 2012-02-29 – 2012-03-01 (×2): 3 mL via INTRAVENOUS

## 2012-02-29 MED ORDER — ALPRAZOLAM 0.25 MG PO TABS: 0.2500 mg | ORAL_TABLET | Freq: Three times a day (TID) | ORAL | Status: DC | PRN

## 2012-02-29 MED ORDER — DEXAMETHASONE SODIUM PHOSPHATE 10 MG/ML IJ SOLN: 10.0000 mg | Freq: Once | INTRAMUSCULAR | Status: DC

## 2012-02-29 MED ORDER — ASPIRIN 81 MG PO TABS: 81.0000 mg | ORAL_TABLET | Freq: Every day | ORAL | Status: DC

## 2012-02-29 MED ORDER — HEPARIN BOLUS VIA INFUSION: 4000.0000 [IU] | Freq: Once | INTRAVENOUS | Status: DC

## 2012-02-29 MED ORDER — SODIUM CHLORIDE 0.9 % IV BOLUS (SEPSIS)
1500.0000 mL | Freq: Once | INTRAVENOUS | Status: AC
  Administered 2012-02-29: 1000 mL via INTRAVENOUS

## 2012-02-29 MED ORDER — HEPARIN (PORCINE) IN NACL 100-0.45 UNIT/ML-% IJ SOLN
1500.0000 [IU]/h | INTRAMUSCULAR | Status: DC
  Administered 2012-02-29: 1300 [IU]/h via INTRAVENOUS
  Administered 2012-03-01 (×2): 1500 [IU]/h via INTRAVENOUS
  Filled 2012-02-29 (×4): qty 250

## 2012-02-29 MED ORDER — WARFARIN - PHARMACIST DOSING INPATIENT
Freq: Every day | Status: DC
  Administered 2012-03-02: 18:00:00

## 2012-02-29 MED ORDER — ONDANSETRON HCL 4 MG/2ML IJ SOLN
4.0000 mg | Freq: Once | INTRAMUSCULAR | Status: AC
  Administered 2012-02-29: 4 mg via INTRAVENOUS
  Filled 2012-02-29: qty 2

## 2012-02-29 MED ORDER — ONDANSETRON HCL 4 MG/2ML IJ SOLN
4.0000 mg | Freq: Four times a day (QID) | INTRAMUSCULAR | Status: DC | PRN
  Administered 2012-03-02 (×2): 4 mg via INTRAVENOUS
  Filled 2012-02-29 (×3): qty 2

## 2012-02-29 MED ORDER — METOCLOPRAMIDE HCL 5 MG/ML IJ SOLN: 10.0000 mg | Freq: Once | INTRAMUSCULAR | Status: DC

## 2012-02-29 MED ORDER — METOCLOPRAMIDE HCL 5 MG/ML IJ SOLN
10.0000 mg | Freq: Once | INTRAMUSCULAR | Status: AC
  Administered 2012-02-29: 10 mg via INTRAVENOUS
  Filled 2012-02-29: qty 2

## 2012-02-29 MED ORDER — PROMETHAZINE HCL 25 MG/ML IJ SOLN
25.0000 mg | INTRAMUSCULAR | Status: DC
  Filled 2012-02-29: qty 1

## 2012-02-29 MED ORDER — WARFARIN VIDEO
Freq: Once | Status: AC
  Administered 2012-03-01

## 2012-02-29 MED ORDER — EZETIMIBE-SIMVASTATIN 10-40 MG PO TABS
1.0000 | ORAL_TABLET | Freq: Every day | ORAL | Status: DC
  Administered 2012-02-29 – 2012-03-03 (×4): 1 via ORAL
  Filled 2012-02-29 (×5): qty 1

## 2012-02-29 MED ORDER — HEPARIN BOLUS VIA INFUSION
5000.0000 [IU] | Freq: Once | INTRAVENOUS | Status: AC
  Administered 2012-02-29: 5000 [IU] via INTRAVENOUS

## 2012-02-29 MED ORDER — WARFARIN SODIUM 7.5 MG PO TABS
7.5000 mg | ORAL_TABLET | ORAL | Status: AC
  Administered 2012-02-29: 7.5 mg via ORAL
  Filled 2012-02-29 (×2): qty 1

## 2012-02-29 MED ORDER — COUMADIN BOOK
Freq: Once | Status: AC
  Administered 2012-02-29
  Filled 2012-02-29: qty 1

## 2012-02-29 MED ORDER — ASPIRIN 81 MG PO CHEW
81.0000 mg | CHEWABLE_TABLET | Freq: Every day | ORAL | Status: DC
  Administered 2012-02-29 – 2012-03-04 (×5): 81 mg via ORAL
  Filled 2012-02-29 (×5): qty 1

## 2012-02-29 NOTE — ED Provider Notes (Signed)
History     CSN: [id number]  Arrival date & time 02/29/12  1315   First MD Initiated Contact with Patient 02/29/12 1406      Chief Complaint  Patient presents with  . Headache  . Emesis    (Consider location/radiation/quality/duration/timing/severity/associated sxs/prior treatment) HPI Comments: Ms. Domek presents for evaluation from home.  She reports feeling ill over the last 2 nights.  Last night, she developed a throbbing headache behind her eyes.  She went to bed but awoke a short time later vomiting.  She had profuse vomiting that lasted until 0500 this morning.  She reports feeling continued weakness and fatigue.  She denies any respiratory complaints, chest pain, palpitations, near syncope, abdominal pain, dysuria, and back pain.  The history is provided by the patient. No language interpreter was used.    Past Medical History  Diagnosis Date  . Coronary artery disease   . Hypercholesteremia   . Hypertension   . Diabetes mellitus   . Reflux esophagitis   . Depression   . GERD (gastroesophageal reflux disease)   . Acute MI   . Cancer     breast    Past Surgical History  Procedure Laterality Date  . Coronary artery bypass graft      x4  . Ankle surgery      left  . Cholecystectomy    . Cataract extraction, bilateral    . Right renal detachment surgery    . Eye surgery      right    Family History  Problem Relation Age of Onset  . Coronary artery disease Mother 21    status post CABG   . Heart attack Father 36    deceased secondary to fatal  first myocardial infarction  . Coronary artery disease Sister 36    alive had bypass surgery  . Cancer Sister     History  Substance Use Topics  . Smoking status: Former Smoker    Quit date: 01/13/2004  . Smokeless tobacco: Not on file  . Alcohol Use: No    OB History   Grav Para Term Preterm Abortions TAB SAB Ect Mult Living                  Review of Systems  Constitutional: Positive for appetite  change and fatigue. Negative for fever, chills, diaphoresis and activity change.  HENT: Positive for congestion (chronic) and rhinorrhea (chronic). Negative for hearing loss, ear pain, sore throat, mouth sores, trouble swallowing, neck pain, neck stiffness, postnasal drip and tinnitus.   Eyes: Positive for pain. Negative for visual disturbance.  Respiratory: Negative for cough, chest tightness, shortness of breath and wheezing.   Cardiovascular: Negative for chest pain, palpitations and leg swelling.  Gastrointestinal: Positive for nausea and vomiting. Negative for abdominal pain, diarrhea and abdominal distention.  Genitourinary: Negative for dysuria.  Musculoskeletal: Negative for myalgias, back pain and arthralgias.  Skin: Negative for pallor, rash and wound.  Neurological: Positive for headaches. Negative for tremors, seizures, syncope, facial asymmetry and light-headedness.    Allergies  Niacin  Home Medications   Current Outpatient Rx  Name  Route  Sig  Dispense  Refill  . ALPRAZolam (XANAX) 0.25 MG tablet   Oral   Take 0.25 mg by mouth 3 (three) times daily as needed. For anxiety         . aspirin 81 MG tablet   Oral   Take 81 mg by mouth daily.           [name]  carvedilol (COREG) 6.25 MG tablet   Oral   Take 6.25 mg by mouth 2 (two) times daily with a meal.           . ezetimibe-simvastatin (VYTORIN) 10-40 MG per tablet   Oral   Take 1 tablet by mouth at bedtime.           [name] glimepiride (AMARYL) 2 MG tablet   Oral   Take 2 mg by mouth at bedtime.          . metFORMIN (GLUCOPHAGE-XR) 500 MG 24 hr tablet   Oral   Take 1,000 mg by mouth 2 (two) times daily.           . sertraline (ZOLOFT) 50 MG tablet   Oral   Take 50 mg by mouth daily.             BP 159/69  Pulse 87  Temp(Src) 98.3 F (36.8 C) (Oral)  Resp 20  SpO2 94%  Physical Exam  Nursing note and vitals reviewed. Constitutional: She is oriented to person, place, and time. She appears  well-developed and well-nourished. No distress.  HENT:  Head: Normocephalic and atraumatic.  Right Ear: External ear normal.  Left Ear: External ear normal.  Nose: Nose normal.  Mouth/Throat: Oropharynx is clear and moist. No oropharyngeal exudate.  Eyes: Conjunctivae and EOM are normal. Pupils are equal, round, and reactive to light. Right eye exhibits no discharge and no exudate. Left eye exhibits no discharge and no exudate. Right conjunctiva is not injected. Right conjunctiva has no hemorrhage. Left conjunctiva is not injected. Left conjunctiva has no hemorrhage. No scleral icterus. Right eye exhibits normal extraocular motion and no nystagmus. Left eye exhibits normal extraocular motion and no nystagmus. Right pupil is round and reactive. Left pupil is round and reactive. Pupils are equal.  Fundoscopic exam:      The right eye shows no papilledema.       The left eye shows no papilledema.  Neck: Trachea normal, normal range of motion, full passive range of motion without pain and phonation normal. Neck supple. Normal carotid pulses and no JVD present. No tracheal tenderness, no spinous process tenderness and no muscular tenderness present. Carotid bruit is not present. No rigidity. No tracheal deviation, no erythema and normal range of motion present.  Cardiovascular: Normal rate, regular rhythm, intact distal pulses and normal pulses.   No extrasystoles are present. PMI is not displaced.  Exam reveals no gallop, no friction rub and no decreased pulses.   Murmur heard.  Systolic murmur is present with a grade of 2/6  Pulmonary/Chest: Effort normal and breath sounds normal. No stridor. No respiratory distress. She has no wheezes. She has no rales. She exhibits no tenderness, no crepitus, no edema and no swelling.  Abdominal: Soft. Normal appearance and bowel sounds are normal. She exhibits no distension and no mass. There is no tenderness. There is no rebound, no guarding and no CVA tenderness.  No hernia.  Musculoskeletal: Normal range of motion. She exhibits no edema and no tenderness.  Neurological: She is alert and oriented to person, place, and time.  Skin: Skin is warm and dry. No rash noted. She is not diaphoretic. No erythema. No pallor.  Psychiatric: She has a normal mood and affect. Her behavior is normal.    ED Course  LUMBAR PUNCTURE Date/Time: 02/29/2012 5:12 PM Performed by: Dana Allan T Authorized by: Dana Allan T Consent: Verbal consent obtained. written consent obtained. Risks and benefits: risks, benefits  and alternatives were discussed Consent given by: patient Patient understanding: patient states understanding of the procedure being performed Patient consent: the patient's understanding of the procedure matches consent given Procedure consent: procedure consent matches procedure scheduled Relevant documents: relevant documents present and verified Test results: test results available and properly labeled Required items: required blood products, implants, devices, and special equipment available Patient identity confirmed: verbally with patient and arm band Time out: Immediately prior to procedure a "time out" was called to verify the correct patient, procedure, equipment, support staff and site/side marked as required. Indications: evaluation for infection and evaluation for subarachnoid hemorrhage (evaluation for headache and leukocytosis) Anesthesia: local infiltration Local anesthetic: lidocaine 1% without epinephrine Anesthetic total: 3 ml Patient sedated: no Preparation: Patient was prepped and draped in the usual sterile fashion. Lumbar space: L3-L4 interspace Patient's position: sitting Needle gauge: 22 Needle type: spinal needle - Quincke tip Needle length: 3.5 in Number of attempts: 3 Fluid appearance: clear Tubes of fluid: 4 Total volume: 5.5 ml Post-procedure: adhesive bandage applied Patient tolerance: Patient tolerated the  procedure well with no immediate complications. Comments: Attempted initially in left lateral decubitus position.  Unable to enter the subarachnoid space.  Pt moved to the upright position - procedure was successful and well tolerated.   (including critical care time)  Labs Reviewed  CBC WITH DIFFERENTIAL - Abnormal; Notable for the following:    WBC 26.4 (*)    Neutrophils Relative 85 (*)    Lymphocytes Relative 5 (*)    Neutro Abs 22.5 (*)    Monocytes Absolute 1.8 (*)    Eosinophils Absolute 0.8 (*)    All other components within normal limits  COMPREHENSIVE METABOLIC PANEL - Abnormal; Notable for the following:    Glucose, Bld 200 (*)    Creatinine, Ser 1.28 (*)    AST 92 (*)    ALT 91 (*)    GFR calc non Af Amer 43 (*)    GFR calc Af Amer 50 (*)    All other components within normal limits  URINALYSIS, ROUTINE W REFLEX MICROSCOPIC - Abnormal; Notable for the following:    APPearance CLOUDY (*)    Hgb urine dipstick SMALL (*)    Bilirubin Urine SMALL (*)    Protein, ur 30 (*)    All other components within normal limits  URINE MICROSCOPIC-ADD ON   No results found.   No diagnosis found.   Date: 02/29/2012 @ 1615  Rate: 90 bpm  Rhythm: sinus  QRS Axis: left  Intervals: normal  ST/T Wave abnormalities: nonspecific ST changes  Conduction Disutrbances:incomplete RBBB  Narrative Interpretation:   Old EKG Reviewed: unchanged     MDM  Pt presents for evaluation of a headache, vomiting, and fatigue.  She appears nontoxic, note stable VS, NAD.  She has no clinical evidence of meningitis or a CVA.  Routine labs obtained during the triage process demonstrate a significant leukocytosis with shift.  The urinalysis is negative for evidence of a UTI.  She has elevations of the AST and ALT without abnormality of the Tbili and Alk Phos.  She has a mildly elevated creatinine.  Her abdomen is soft and nontender.  As the headache began prior to the vomiting, will obtain a CT of the  head.   1715.  Pt stable, NAD.  Headache has improved s/p IV reglan and IVF.  Ct scan of head is negative for acute pathology.  Secondary to the sudden onset HA with vomiting, obtained consent and performed  a LP for CSF analysis.  LP performed in the upright position and an opening pressure was unable to be obtained.  Also performed an EKG.  There is no evidence of acute strain and she has no new EKG changes.  She has a recent superficial venous thrombophlebitis in her leg.  i obtained a d-dimer which has returned elevated.  She has no respiratory complaints or clinical evidence of PE.  Pt signed-over to Dr. Anitra Lauth pending the  Results of the CSF analysis.           Tobin Chad, MD 02/29/12 1728

## 2012-02-29 NOTE — H&P (Signed)
[facility] History and Physical  Taylor [name] WUJ:[id number] DOB: 08/29/1945 DOA: 02/29/2012  Referring physician: ED PCP: [name], MD  Specialists: [facility]  Chief Complaint: Chest pain, headache  HPI: Taylor [name] is a 67 y.o. female who presents with c/o chest pain and headache onset 2 days ago.  Went to bed last night because she wasn't feeling well but woke up later with vomiting that lasted until 0500 this morning.  She presented to the ED with continued feelings of weakness and fatigue, had no shortness of breath, chest pain, palpatations.  A large workup was performed in the ED which ultimately demonstrated leukocytosis of 26k, troponin of 1.68, and PE on CT chest.  Other labs performed include an unremarkable spinal tap and UA.  Hospitalist has been asked to admit the patient for PE.  Review of Systems: The patient does have LLE swelling and pain on her inner thigh recently, she has a known SVT at that location but does not know of a DVT there, 12 systems reviewed and otherwise negative.  Past Medical History  Diagnosis Date  . Coronary artery disease   . Hypercholesteremia   . Hypertension   . Diabetes mellitus   . Reflux esophagitis   . Depression   . GERD (gastroesophageal reflux disease)   . Acute MI   . Cancer     breast   Past Surgical History  Procedure Laterality Date  . Coronary artery bypass graft      x4  . Ankle surgery      left  . Cholecystectomy    . Cataract extraction, bilateral    . Right renal detachment surgery    . Eye surgery      right  . Mastectomy     Social History:  reports that she quit smoking about 8 years ago. She does not have any smokeless tobacco history on file. She reports that she does not drink alcohol or use illicit drugs.   Allergies  Allergen Reactions  . Niacin Other (See Comments)    burning    Family History  Problem Relation Age of Onset  . Coronary artery disease Mother 38    status  post CABG   . Heart attack Father 78    deceased secondary to fatal  first myocardial infarction  . Coronary artery disease Sister 71    alive had bypass surgery  . Cancer Sister   . Deep vein thrombosis Cousin     Neice had after knee surgery    Prior to Admission medications   Medication Sig Start Date End Date Taking? Authorizing Provider  ALPRAZolam (XANAX) 0.25 MG tablet Take 0.25 mg by mouth 3 (three) times daily as needed. For anxiety   Yes Historical Provider, MD  aspirin 81 MG tablet Take 81 mg by mouth daily.     Yes Historical Provider, MD  carvedilol (COREG) 6.25 MG tablet Take 6.25 mg by mouth 2 (two) times daily with a meal.     Yes Historical Provider, MD  ezetimibe-simvastatin (VYTORIN) 10-40 MG per tablet Take 1 tablet by mouth at bedtime.     Yes Historical Provider, MD  glimepiride (AMARYL) 2 MG tablet Take 2 mg by mouth at bedtime.    Yes Historical Provider, MD  metFORMIN (GLUCOPHAGE-XR) 500 MG 24 hr tablet Take 1,000 mg by mouth 2 (two) times daily.     Yes Historical Provider, MD  sertraline (ZOLOFT) 50 MG tablet Take 50 mg by mouth daily.  Yes Historical Provider, MD   Physical Exam: Filed Vitals:   02/29/12 2015 02/29/12 2030 02/29/12 2045 02/29/12 2100  BP: 128/58 132/58 124/74 129/56  Pulse: 76 78 75 77  Temp:      TempSrc:      Resp: 27 27 23 20   Height:      Weight:      SpO2: 97% 96% 96% 97%    General:  NAD, resting comfortably in bed Eyes: PEERLA EOMI ENT: mucous membranes moist Neck: supple w/o JVD Cardiovascular: RRR w/o MRG Respiratory: CTA B Abdomen: soft, nt, nd, bs+ Skin: no rash nor lesion Musculoskeletal: MAE, full ROM all 4 extremities Psychiatric: normal tone and affect Neurologic: AAOx3, grossly non-focal  Labs on Admission:  Basic Metabolic Panel:  Recent Labs Lab 02/29/12 1334  NA 139  K 3.8  CL 98  CO2 25  GLUCOSE 200*  BUN 21  CREATININE 1.28*  CALCIUM 9.1   Liver Function Tests:  Recent Labs Lab  02/29/12 1334  AST 92*  ALT 91*  ALKPHOS 71  BILITOT 0.5  PROT 7.9  ALBUMIN 3.7   No results found for this basename: LIPASE, AMYLASE,  in the last 168 hours No results found for this basename: AMMONIA,  in the last 168 hours CBC:  Recent Labs Lab 02/29/12 1334  WBC 26.4*  NEUTROABS 22.5*  HGB 13.0  HCT 36.8  MCV 85.6  PLT 208   Cardiac Enzymes:  Recent Labs Lab 02/29/12 1825  TROPONINI 1.68*    BNP (last 3 results) No results found for this basename: PROBNP,  in the last 8760 hours CBG: No results found for this basename: GLUCAP,  in the last 168 hours  Radiological Exams on Admission: Ct Head Wo Contrast  02/29/2012  *RADIOLOGY REPORT*  Clinical Data: Headache, emesis  CT HEAD WITHOUT CONTRAST  Technique:  Contiguous axial images were obtained from the base of the skull through the vertex without contrast.  Comparison: MR 06/29/2007  Findings: Atherosclerotic and physiologic intracranial calcifications.  Mild atrophy. There is no evidence of acute intracranial hemorrhage, brain edema, mass lesion, acute infarction,   mass effect, or midline shift. Acute infarct may be inapparent on noncontrast CT.  No other intra-axial abnormalities are seen, and the ventricles and sulci are within normal limits in size and symmetry.   No abnormal extra-axial fluid collections or masses are identified.  No significant calvarial abnormality.  IMPRESSION: 1. Negative for bleed or other acute intracranial process.   Original Report Authenticated By: D. Andria Rhein, MD    Ct Angio Chest Pe W/cm &/or Wo Cm  02/29/2012  *RADIOLOGY REPORT*  Clinical Data: Elevated D-dimer, evaluate for pulmonary embolism  CT ANGIOGRAPHY CHEST  Technique:  Multidetector CT imaging of the chest using the standard protocol during bolus administration of intravenous contrast. Multiplanar reconstructed images including MIPs were obtained and reviewed to evaluate the vascular anatomy.  Contrast: OMNIPAQUE IOHEXOL  350 MG/ML SOLN  Comparison: Prior chest x-ray 03/19/2009  Findings:  Mediastinum: Unremarkable CT appearance of the thyroid gland. Nonspecific not enlarged by CT criteria but slightly prominent prevascular lymph nodes measuring up to 7 mm in short axis. Mildly patulous thoracic esophagus.  No soft tissue mass.  Heart/Vascular: Opacification of the pulmonary arterial tree to the proximal subsegmental level.  Central filling defects consistent with acute pulmonary emboli identified in the right lower lobe or pulmonary artery extending into the segmental and subsegmental branches.  Additional filling defects identified within the segmental pulmonary arteries supplying  the left lower lobe.  There is mild cardiomegaly with right atrial enlargement.  The patient is status post median sternotomy with evidence of prior multivessel CABG.  Atherosclerotic vascular calcifications identified throughout the coronary arteries.  No pericardial effusion.  Lungs/Pleura:  Subsegmental atelectasis in the bilateral lower lobes.  Mild subpleural lipomatosis on the left posteriorly. Evaluation for small pulmonary nodules is limited by respiratory motion.  Mild diffuse bronchial wall thickening.  No suspicious pulmonary nodule.  No pleural effusion or pneumothorax.  Upper Abdomen: Unremarkable visualized upper abdomen.  Bones: No acute fracture or aggressive appearing lytic or blastic osseous lesion.  IMPRESSION:  1.  Positive for pulmonary emboli.  There are bilateral lower lobe pulmonary emboli to the lobar level on the right and segmental level on the left. 2.  Cardiomegaly with right atrial enlargement and surgical changes of prior multi vessel CABG. 3.  Bilateral lower lobe subsegmental atelectasis may be related to VQ mismatch from the pulmonary emboli. 4.  Nonspecific mediastinal lymph nodes which are not enlarged by CT criteria and likely reactive in nature.  Critical Value/emergent results were called by telephone at the time of  interpretation on February 29, 2012 at 07:25 p.m. to Dr. Gwyneth Sprout, who verbally acknowledged these results.   Original Report Authenticated By: Malachy Moan, M.D.     EKG: Independently reviewed. Grossly unchanged from 2012.  Assessment/Plan Principal Problem:   PE (pulmonary embolism) Active Problems:   DIABETES MELLITUS, TYPE II   HYPERTENSION   Coronary artery disease   1. PE - patient started on heparin gtt, hypoxia believed to be due to atelectasis associated from this, coumadin also started for now, 2d echo ordered, venous duplex of BLE ordered as i suspect her SVT may be associated with a DVT now since SVT is rarely associated with PE.  Starting the hypercoagulable workup as well. 2. Elevated Troponin - consulting cardiology, suspect that this represents demand ischemia from her known CAD in the setting of new PE, trending troponins while inpatient. 3. DM2 - holding metformin as patient just received IV contrast and will put her on med dose SSI while here 4. HTN - continue home meds.    Code Status: Full Code (must indicate code status--if unknown or must be presumed, indicate so) Family Communication: Spoke with multiple family members at bedside (indicate person spoken with, if applicable, with phone number if by telephone) Disposition Plan: Admit to inpatient (indicate anticipated LOS)  Time spent: 70 min  Jamilee Lafosse M. [facility] Pager [phone]  If 7PM-7AM, please contact night-coverage [email or web address] Password [facility] 02/29/2012, 9:11 PM

## 2012-02-29 NOTE — Consult Note (Signed)
Patient ID: SEE BEHARRY MRN: [id number], DOB/AGE: [birth year]   Admit date: 02/29/2012 Date of Consult: @TODAY @  Primary Physician: [name], MD Primary Cardiologist: Ermalene Postin   Problem List: Past Medical History  Diagnosis Date  . Coronary artery disease   . Hypercholesteremia   . Hypertension   . Diabetes mellitus   . Reflux esophagitis   . Depression   . GERD (gastroesophageal reflux disease)   . Acute MI   . Cancer     breast    Past Surgical History  Procedure Laterality Date  . Coronary artery bypass graft      x4  . Ankle surgery      left  . Cholecystectomy    . Cataract extraction, bilateral    . Right renal detachment surgery    . Eye surgery      right     Allergies:  Allergies  Allergen Reactions  . Niacin Other (See Comments)    burning    HPI:   Patient is a 67 yo who we are asked to see regarding a positive troponin The patient presented today with headache and vomiting which began last night  Felt weak   Denied CP, SOB, palpitations, dizziness.  CT  (head) and LP were negative Hx of recent superficial thrombophlebitis in leg (seen in ER a couple wks ago).  D Dimer done.  Elevated.  CT was positve for PE Patinet notes occasional chest tightness a few times per week.  NOt associated with activity.  Relieved with Xanax  Has had for months.  No acceleration.  Not like previous angina. Inpatient Medications:  . ondansetron        Family History  Problem Relation Age of Onset  . Coronary artery disease Mother 49    status post CABG   . Heart attack Father 18    deceased secondary to fatal  first myocardial infarction  . Coronary artery disease Sister 32    alive had bypass surgery  . Cancer Sister      History   Social History  . Marital Status: Married    Spouse Name: N/A    Number of Children: N/A  . Years of Education: N/A   Occupational History  . Not on file.   Social History Main Topics  . Smoking status: Former  Smoker    Quit date: 01/13/2004  . Smokeless tobacco: Not on file  . Alcohol Use: No  . Drug Use: No  . Sexually Active: Not on file   Other Topics Concern  . Not on file   Social History Narrative  . No narrative on file     Review of Systems: All other systems reviewed and are otherwise negative except as noted above.  Physical Exam: Filed Vitals:   02/29/12 2030  BP: 132/58  Pulse: 78  Temp:   Resp: 27   No intake or output data in the 24 hours ending 02/29/12 2046  General: Well developed, well nourished, in no acute distress. Head: Normocephalic, atraumatic, sclera non-icteric Neck: Negative for carotid bruits. JVP not elevated. Lungs: Clear bilaterally to auscultation without wheezes, rales, or rhonchi. Breathing is unlabored. Heart: RRR with S1 S2. No murmurs, rubs, or gallops appreciated. Abdomen: Soft, non-tender, non-distended with normoactive bowel sounds. No hepatomegaly. No rebound/guarding. No obvious abdominal masses. Msk:  Strength and tone appears normal for age. Extremities: No clubbing, cyanosis.  L leg larger than R  No edema.  2+ PT Neuro: Alert and oriented X  3. Moves all extremities spontaneously. Psych:  Responds to questions appropriately with a normal affect.  Labs: Results for orders placed during the hospital encounter of 02/29/12 (from the past 24 hour(s))  CBC WITH DIFFERENTIAL     Status: Abnormal   Collection Time    02/29/12  1:34 PM      Result Value Range   WBC 26.4 (*) 4.0 - 10.5 K/uL   RBC 4.30  3.87 - 5.11 MIL/uL   Hemoglobin 13.0  12.0 - 15.0 g/dL   HCT 16.1  09.6 - 04.5 %   MCV 85.6  78.0 - 100.0 fL   MCH 30.2  26.0 - 34.0 pg   MCHC 35.3  30.0 - 36.0 g/dL   RDW 40.9  81.1 - 91.4 %   Platelets 208  150 - 400 K/uL   Neutrophils Relative 85 (*) 43 - 77 %   Lymphocytes Relative 5 (*) 12 - 46 %   Monocytes Relative 7  3 - 12 %   Eosinophils Relative 3  0 - 5 %   Basophils Relative 0  0 - 1 %   Neutro Abs 22.5 (*) 1.7 - 7.7  K/uL   Lymphs Abs 1.3  0.7 - 4.0 K/uL   Monocytes Absolute 1.8 (*) 0.1 - 1.0 K/uL   Eosinophils Absolute 0.8 (*) 0.0 - 0.7 K/uL   Basophils Absolute 0.0  0.0 - 0.1 K/uL   WBC Morphology INCREASED BANDS (>20% BANDS)    COMPREHENSIVE METABOLIC PANEL     Status: Abnormal   Collection Time    02/29/12  1:34 PM      Result Value Range   Sodium 139  135 - 145 mEq/L   Potassium 3.8  3.5 - 5.1 mEq/L   Chloride 98  96 - 112 mEq/L   CO2 25  19 - 32 mEq/L   Glucose, Bld 200 (*) 70 - 99 mg/dL   BUN 21  6 - 23 mg/dL   Creatinine, Ser 7.82 (*) 0.50 - 1.10 mg/dL   Calcium 9.1  8.4 - 95.6 mg/dL   Total Protein 7.9  6.0 - 8.3 g/dL   Albumin 3.7  3.5 - 5.2 g/dL   AST 92 (*) 0 - 37 U/L   ALT 91 (*) 0 - 35 U/L   Alkaline Phosphatase 71  39 - 117 U/L   Total Bilirubin 0.5  0.3 - 1.2 mg/dL   GFR calc non Af Amer 43 (*) >90 mL/min   GFR calc Af Amer 50 (*) >90 mL/min  URINALYSIS, ROUTINE W REFLEX MICROSCOPIC     Status: Abnormal   Collection Time    02/29/12  2:26 PM      Result Value Range   Color, Urine YELLOW  YELLOW   APPearance CLOUDY (*) CLEAR   Specific Gravity, Urine 1.022  1.005 - 1.030   pH 5.0  5.0 - 8.0   Glucose, UA NEGATIVE  NEGATIVE mg/dL   Hgb urine dipstick SMALL (*) NEGATIVE   Bilirubin Urine SMALL (*) NEGATIVE   Ketones, ur NEGATIVE  NEGATIVE mg/dL   Protein, ur 30 (*) NEGATIVE mg/dL   Urobilinogen, UA 0.2  0.0 - 1.0 mg/dL   Nitrite NEGATIVE  NEGATIVE   Leukocytes, UA NEGATIVE  NEGATIVE  URINE MICROSCOPIC-ADD ON     Status: None   Collection Time    02/29/12  2:26 PM      Result Value Range   Squamous Epithelial / LPF RARE  RARE   WBC, UA  0-2  <3 WBC/hpf   RBC / HPF 0-2  <3 RBC/hpf   Bacteria, UA RARE  RARE  D-DIMER, QUANTITATIVE     Status: Abnormal   Collection Time    02/29/12  3:53 PM      Result Value Range   D-Dimer, Quant 12.54 (*) 0.00 - 0.48 ug/mL-FEU  GRAM STAIN     Status: None   Collection Time    02/29/12  4:55 PM      Result Value Range   Specimen  Description CSF     Special Requests NONE     Gram Stain       Value: CYTOSPIN PREP     WBC PRESENT, PREDOMINANTLY MONONUCLEAR     NO ORGANISMS SEEN   Report Status 02/29/2012 FINAL    PROTEIN, CSF     Status: None   Collection Time    02/29/12  4:56 PM      Result Value Range   Total  Protein, CSF 33  15 - 45 mg/dL  GLUCOSE, CSF     Status: Abnormal   Collection Time    02/29/12  4:56 PM      Result Value Range   Glucose, CSF 111 (*) 43 - 76 mg/dL  CSF CELL COUNT WITH DIFFERENTIAL     Status: Abnormal   Collection Time    02/29/12  4:56 PM      Result Value Range   Tube # 1     Color, CSF COLORLESS  COLORLESS   Appearance, CSF CLEAR (*) CLEAR   Supernatant NOT INDICATED     RBC Count, CSF 29 (*) 0 /cu mm   WBC, CSF 2  0 - 5 /cu mm   Segmented Neutrophils-CSF TOO FEW TO COUNT, SMEAR AVAILABLE FOR REVIEW  0 - 6 %   Lymphs, CSF FEW  40 - 80 %   Monocyte-Macrophage-Spinal Fluid OCCASIONAL  15 - 45 %  CSF CELL COUNT WITH DIFFERENTIAL     Status: Abnormal   Collection Time    02/29/12  4:56 PM      Result Value Range   Tube # 4     Color, CSF COLORLESS  COLORLESS   Appearance, CSF CLEAR  CLEAR   Supernatant NOT INDICATED     RBC Count, CSF 1 (*) 0 /cu mm   WBC, CSF 1  0 - 5 /cu mm   Segmented Neutrophils-CSF TOO FEW TO COUNT, SMEAR AVAILABLE FOR REVIEW  0 - 6 %   Lymphs, CSF RARE  40 - 80 %   Monocyte-Macrophage-Spinal Fluid RARE  15 - 45 %  TROPONIN I     Status: Abnormal   Collection Time    02/29/12  6:25 PM      Result Value Range   Troponin I 1.68 (*) <0.30 ng/mL  POCT I-STAT TROPONIN I     Status: Abnormal   Collection Time    02/29/12  6:34 PM      Result Value Range   Troponin i, poc 0.40 (*) 0.00 - 0.08 ng/mL   Comment NOTIFIED PHYSICIAN     Comment 3           PROTIME-INR     Status: Abnormal   Collection Time    02/29/12  7:59 PM      Result Value Range   Prothrombin Time 16.5 (*) 11.6 - 15.2 seconds   INR 1.37  0.00 - 1.49    Radiology/Studies: Ct  Head Wo Contrast  02/29/2012  *RADIOLOGY REPORT*  Clinical Data: Headache, emesis  CT HEAD WITHOUT CONTRAST  Technique:  Contiguous axial images were obtained from the base of the skull through the vertex without contrast.  Comparison: MR 06/29/2007  Findings: Atherosclerotic and physiologic intracranial calcifications.  Mild atrophy. There is no evidence of acute intracranial hemorrhage, brain edema, mass lesion, acute infarction,   mass effect, or midline shift. Acute infarct may be inapparent on noncontrast CT.  No other intra-axial abnormalities are seen, and the ventricles and sulci are within normal limits in size and symmetry.   No abnormal extra-axial fluid collections or masses are identified.  No significant calvarial abnormality.  IMPRESSION: 1. Negative for bleed or other acute intracranial process.   Original Report Authenticated By: D. Andria Rhein, MD    Ct Angio Chest Pe W/cm &/or Wo Cm  02/29/2012  *RADIOLOGY REPORT*  Clinical Data: Elevated D-dimer, evaluate for pulmonary embolism  CT ANGIOGRAPHY CHEST  Technique:  Multidetector CT imaging of the chest using the standard protocol during bolus administration of intravenous contrast. Multiplanar reconstructed images including MIPs were obtained and reviewed to evaluate the vascular anatomy.  Contrast: OMNIPAQUE IOHEXOL 350 MG/ML SOLN  Comparison: Prior chest x-ray 03/19/2009  Findings:  Mediastinum: Unremarkable CT appearance of the thyroid gland. Nonspecific not enlarged by CT criteria but slightly prominent prevascular lymph nodes measuring up to 7 mm in short axis. Mildly patulous thoracic esophagus.  No soft tissue mass.  Heart/Vascular: Opacification of the pulmonary arterial tree to the proximal subsegmental level.  Central filling defects consistent with acute pulmonary emboli identified in the right lower lobe or pulmonary artery extending into the segmental and subsegmental branches.  Additional filling defects identified within the  segmental pulmonary arteries supplying the left lower lobe.  There is mild cardiomegaly with right atrial enlargement.  The patient is status post median sternotomy with evidence of prior multivessel CABG.  Atherosclerotic vascular calcifications identified throughout the coronary arteries.  No pericardial effusion.  Lungs/Pleura:  Subsegmental atelectasis in the bilateral lower lobes.  Mild subpleural lipomatosis on the left posteriorly. Evaluation for small pulmonary nodules is limited by respiratory motion.  Mild diffuse bronchial wall thickening.  No suspicious pulmonary nodule.  No pleural effusion or pneumothorax.  Upper Abdomen: Unremarkable visualized upper abdomen.  Bones: No acute fracture or aggressive appearing lytic or blastic osseous lesion.  IMPRESSION:  1.  Positive for pulmonary emboli.  There are bilateral lower lobe pulmonary emboli to the lobar level on the right and segmental level on the left. 2.  Cardiomegaly with right atrial enlargement and surgical changes of prior multi vessel CABG. 3.  Bilateral lower lobe subsegmental atelectasis may be related to VQ mismatch from the pulmonary emboli. 4.  Nonspecific mediastinal lymph nodes which are not enlarged by CT criteria and likely reactive in nature.  Critical Value/emergent results were called by telephone at the time of interpretation on February 29, 2012 at 07:25 p.m. to Dr. Gwyneth Sprout, who verbally acknowledged these results.   Original Report Authenticated By: Malachy Moan, M.D.     EKG:  NSR.  INcomplete RBBB.  Nonspecific ST T wave changes.  ASSESSMENT AND PLAN:  Patient is a 67 yo who presents iwht HA and vomiting  Found to have a PE. Troponin is mildly increased.  THis is most likely from RV strain in setting of PE. (demand ischemia) BP is adequate, patient's hemodynamics are OK I do not think she is having active ischemia from CAD. I would not plan  work up for this with presentation of PE>  Do not want to interfere  with anticoagulation Echo has been ordered  This will net change Rx.  Would cycle enzymes.  Continue heparin and transition to oral anticoagulant.  Proceed with work up of PE (hypercoagulable)  2.  CAD Followed in clinic by T Stuckey  CABG in 2006  Follow BP  Add coreg in AM if bp adequate   3.  Hx Breast CA  4.  HTN  Follow  5.  HL  Continue vytorin 10/40   Scherrie Merritts 02/29/2012, 8:46 PM

## 2012-02-29 NOTE — ED Provider Notes (Addendum)
Patient still vomiting with the headache on exam. She states she has not felt well for the last 3 days and she describes it is I'm not going to make it. She states she does have an intermittent grinding chest pain and some shortness of breath on exertion but denies any chest pain or shortness of breath currently. Is elevated at and will do a CT to rule out PE however patient does have a history of superficial thrombophlebitis. Unclear if that would skew the d-dimer results.   Still has a headache and vomiting and it is possible no signs of meningitis on CSF.  No signs of SAH.  Possible cavernous thrombosis.  Will get troponin and CTA of chest to further eval.  Pt is mildly hypoxic on exam at 93-95% on RA.  No tachy and normal BP.  7:46 PM CTA with bilateral PE's and mild elevation of troponin.  Pt started on heparin gtt.  Will admit to medicine.  Also cardiology consulted.  CRITICAL CARE Performed by: Gwyneth Sprout   Total critical care time: 30  Critical care time was exclusive of separately billable procedures and treating other patients.  Critical care was necessary to treat or prevent imminent or life-threatening deterioration.  Critical care was time spent personally by me on the following activities: development of treatment plan with patient and/or surrogate as well as nursing, discussions with consultants, evaluation of patient's response to treatment, examination of patient, obtaining history from patient or surrogate, ordering and performing treatments and interventions, ordering and review of laboratory studies, ordering and review of radiographic studies, pulse oximetry and re-evaluation of patient's condition.   Gwyneth Sprout, MD 02/29/12 1946  Gwyneth Sprout, MD 02/29/12 2002

## 2012-02-29 NOTE — ED Notes (Signed)
Consent signed by patient for Lumbar Puncture.

## 2012-02-29 NOTE — ED Notes (Signed)
Spoke with Lab on CSF send order form for add on.

## 2012-02-29 NOTE — ED Notes (Signed)
Reports continued HA and intermitant nausea. Denies CP, dizziness or sob. VSS. Alert, NAD, calm, interactive. Preparing to call report.

## 2012-02-29 NOTE — ED Notes (Addendum)
Pt states she has not been feeling "well" for the past 2 days. Last night she began experiencing a frontal headache with emesis and has not stopped vomiting since then.  Denies blurred vision or photophobia, but states that her eyes hurt.  Was seen here last week for a superficial thrombo phelbitis.  AO x 4.  Neuro intact.

## 2012-02-29 NOTE — ED Notes (Signed)
Back from CT, EDP into room to explain dx and admission, family x3 at [facility], pt alert, NAD, calm, interactive.

## 2012-02-29 NOTE — Progress Notes (Addendum)
ANTICOAGULATION CONSULT NOTE - Initial Consult  Pharmacy Consult for UFH Indication: pulmonary embolus  Allergies  Allergen Reactions  . Niacin Other (See Comments)    burning    Patient Measurements: Height: 5' 6.93" (170 cm) Weight: 189 lb 9.5 oz (86 kg) IBW/kg (Calculated) : 61.44 Heparin Dosing Weight: 79kg  Vital Signs: Temp: 98.3 F (36.8 C) (02/17 1336) Temp src: Oral (02/17 1336) BP: 127/51 mmHg (02/17 1928) Pulse Rate: 81 (02/17 1930)  Labs:  Recent Labs  02/29/12 1334 02/29/12 1825  HGB 13.0  --   HCT 36.8  --   PLT 208  --   CREATININE 1.28*  --   TROPONINI  --  1.68*    Estimated Creatinine Clearance: 49.3 ml/min (by C-G formula based on Cr of 1.28).   Medical History: Past Medical History  Diagnosis Date  . Coronary artery disease   . Hypercholesteremia   . Hypertension   . Diabetes mellitus   . Reflux esophagitis   . Depression   . GERD (gastroesophageal reflux disease)   . Acute MI   . Cancer     breast    Medications:   (Not in a hospital admission)  Assessment: 67 y/o female patient admitted with headache and some shortness of breath, s/p LP, now with positive CTA chest for b/l pulmonary emboli requiring anticoagulation.  Goal of Therapy:  Heparin level 0.3-0.7 units/ml Monitor platelets by anticoagulation protocol: Yes   Plan:  Heparin 5000 unit IV bolus followed by infusion at 1300 units/hr. Check 6 hour heparin level with daily cbc and heparin level.  Verlene Mayer, PharmD, BCPS Pager [phone] 02/29/2012,7:54 PM  Adding coumadin for PE treatment. 7.5mg  today. INR baseline wnl, f/u daily protime.

## 2012-03-01 DIAGNOSIS — I2510 Atherosclerotic heart disease of native coronary artery without angina pectoris: Secondary | ICD-10-CM | POA: Diagnosis not present

## 2012-03-01 DIAGNOSIS — R51 Headache: Secondary | ICD-10-CM | POA: Diagnosis not present

## 2012-03-01 DIAGNOSIS — I059 Rheumatic mitral valve disease, unspecified: Secondary | ICD-10-CM

## 2012-03-01 DIAGNOSIS — I798 Other disorders of arteries, arterioles and capillaries in diseases classified elsewhere: Secondary | ICD-10-CM

## 2012-03-01 DIAGNOSIS — R609 Edema, unspecified: Secondary | ICD-10-CM

## 2012-03-01 DIAGNOSIS — E1059 Type 1 diabetes mellitus with other circulatory complications: Secondary | ICD-10-CM | POA: Diagnosis not present

## 2012-03-01 DIAGNOSIS — I2699 Other pulmonary embolism without acute cor pulmonale: Principal | ICD-10-CM

## 2012-03-01 DIAGNOSIS — R748 Abnormal levels of other serum enzymes: Secondary | ICD-10-CM | POA: Diagnosis not present

## 2012-03-01 DIAGNOSIS — I5032 Chronic diastolic (congestive) heart failure: Secondary | ICD-10-CM | POA: Diagnosis present

## 2012-03-01 LAB — CBC
HCT: 33 % — ABNORMAL LOW (ref 36.0–46.0)
MCH: 29.7 pg (ref 26.0–34.0)
MCHC: 34.2 g/dL (ref 30.0–36.0)
MCV: 86.8 fL (ref 78.0–100.0)
Platelets: 185 10*3/uL (ref 150–400)
RDW: 12.8 % (ref 11.5–15.5)

## 2012-03-01 LAB — HEMOGLOBIN A1C
Hgb A1c MFr Bld: 7.6 % — ABNORMAL HIGH (ref <5.7)
Mean Plasma Glucose: 171 mg/dL — ABNORMAL HIGH (ref <117)

## 2012-03-01 LAB — BASIC METABOLIC PANEL
Calcium: 8.1 mg/dL — ABNORMAL LOW (ref 8.4–10.5)
Creatinine, Ser: 0.98 mg/dL (ref 0.50–1.10)
GFR calc Af Amer: 69 mL/min — ABNORMAL LOW (ref >90)
GFR calc non Af Amer: 59 mL/min — ABNORMAL LOW (ref >90)

## 2012-03-01 LAB — GLUCOSE, CAPILLARY
Glucose-Capillary: 160 mg/dL — ABNORMAL HIGH (ref 70–99)
Glucose-Capillary: 178 mg/dL — ABNORMAL HIGH (ref 70–99)

## 2012-03-01 LAB — PROTIME-INR: Prothrombin Time: 14.7 seconds (ref 11.6–15.2)

## 2012-03-01 LAB — HEPARIN LEVEL (UNFRACTIONATED): Heparin Unfractionated: 0.4 IU/mL (ref 0.30–0.70)

## 2012-03-01 MED ORDER — ACETAMINOPHEN 325 MG PO TABS
650.0000 mg | ORAL_TABLET | Freq: Four times a day (QID) | ORAL | Status: DC | PRN
  Administered 2012-03-01 – 2012-03-04 (×7): 650 mg via ORAL
  Filled 2012-03-01 (×7): qty 2

## 2012-03-01 MED ORDER — WARFARIN SODIUM 7.5 MG PO TABS
7.5000 mg | ORAL_TABLET | Freq: Once | ORAL | Status: AC
  Administered 2012-03-01: 7.5 mg via ORAL
  Filled 2012-03-01: qty 1

## 2012-03-01 MED ORDER — IBUPROFEN 600 MG PO TABS
600.0000 mg | ORAL_TABLET | Freq: Once | ORAL | Status: AC
  Administered 2012-03-01: 600 mg via ORAL
  Filled 2012-03-01: qty 1

## 2012-03-01 MED ORDER — IBUPROFEN 400 MG PO TABS
400.0000 mg | ORAL_TABLET | Freq: Four times a day (QID) | ORAL | Status: DC | PRN
  Filled 2012-03-01 (×2): qty 1

## 2012-03-01 MED ORDER — HEPARIN (PORCINE) IN NACL 100-0.45 UNIT/ML-% IJ SOLN
1800.0000 [IU]/h | INTRAMUSCULAR | Status: DC
  Administered 2012-03-02: 1800 [IU]/h via INTRAVENOUS
  Administered 2012-03-02: 1600 [IU]/h via INTRAVENOUS
  Administered 2012-03-03 – 2012-03-04 (×2): 1800 [IU]/h via INTRAVENOUS
  Filled 2012-03-01 (×7): qty 250

## 2012-03-01 NOTE — Progress Notes (Signed)
Patient has arrived to unit from 2900, patient is alert and oriented x4 and stable; will continue to monitor patient.  Lorretta Harp RN

## 2012-03-01 NOTE — Progress Notes (Signed)
  Echocardiogram 2D Echocardiogram has been performed.  Ellender Hose A 03/01/2012, 2:51 PM

## 2012-03-01 NOTE — Progress Notes (Signed)
Patient Name: Taylor [name] Date of Encounter: 03/01/2012     Principal Problem:   PE (pulmonary embolism) Active Problems:   DIABETES MELLITUS, TYPE II   HYPERTENSION   Coronary artery disease    SUBJECTIVE  Does not feel very well.  Findings noted and Dr.Ross note reviewed.  Complains of continued headache.    CURRENT MEDS . aspirin  81 mg Oral Daily  . carvedilol  6.25 mg Oral BID WC  . ezetimibe-simvastatin  1 tablet Oral QHS  . insulin aspart  0-15 Units Subcutaneous TID WC  . sertraline  50 mg Oral Daily  . sodium chloride  3 mL Intravenous Q12H  . Warfarin - Pharmacist Dosing Inpatient   Does not apply q1800    OBJECTIVE  Filed Vitals:   03/01/12 0500 03/01/12 0600 03/01/12 0700 03/01/12 0755  BP: 106/46 108/58 111/64 139/99  Pulse: 73 74  76  Temp:    98 F (36.7 C)  TempSrc:    Oral  Resp: 21 22 19 25   Height:      Weight:      SpO2: 97% 99% 98% 96%    Intake/Output Summary (Last 24 hours) at 03/01/12 0809 Last data filed at 03/01/12 0757  Gross per 24 hour  Intake    104 ml  Output    701 ml  Net   -597 ml   Filed Weights   02/29/12 1845 02/29/12 2334 03/01/12 0000  Weight: 189 lb 9.5 oz (86 kg) 195 lb 1.7 oz (88.5 kg) 195 lb 1.7 oz (88.5 kg)    PHYSICAL EXAM  General: Pleasant, NAD. Neuro: Alert and oriented X 3. Moves all extremities spontaneously. Psych: Normal affect. HEENT:  Normal  Neck: Supple without bruits or JVD. Lungs:  Resp regular and unlabored, CTA. Heart: RRR no s3, s4, or murmurs.   Accessory Clinical Findings  CBC  Recent Labs  02/29/12 1334 03/01/12 0300  WBC 26.4* 20.6*  NEUTROABS 22.5*  --   HGB 13.0 11.3*  HCT 36.8 33.0*  MCV 85.6 86.8  PLT 208 185   Basic Metabolic Panel  Recent Labs  02/29/12 1334 03/01/12 0300  NA 139 141  K 3.8 3.4*  CL 98 104  CO2 25 26  GLUCOSE 200* 191*  BUN 21 20  CREATININE 1.28* 0.98  CALCIUM 9.1 8.1*   Liver Function Tests  Recent Labs  02/29/12 1334  AST  92*  ALT 91*  ALKPHOS 71  BILITOT 0.5  PROT 7.9  ALBUMIN 3.7   No results found for this basename: LIPASE, AMYLASE,  in the last 72 hours Cardiac Enzymes  Recent Labs  02/29/12 1825 02/29/12 2105 03/01/12 0300  TROPONINI 1.68* 1.58* 0.98*   BNP No components found with this basename: POCBNP,  D-Dimer  Recent Labs  02/29/12 1553  DDIMER 12.54*   Hemoglobin A1C No results found for this basename: HGBA1C,  in the last 72 hours Fasting Lipid Panel No results found for this basename: CHOL, HDL, LDLCALC, TRIG, CHOLHDL, LDLDIRECT,  in the last 72 hours Thyroid Function Tests No results found for this basename: TSH, T4TOTAL, FREET3, T3FREE, THYROIDAB,  in the last 72 hours   ECG  NSR.  No acute changes.  Mild T flattening, diffuse.    Radiology/Studies  Ct Head Wo Contrast  02/29/2012  *RADIOLOGY REPORT*  Clinical Data: Headache, emesis  CT HEAD WITHOUT CONTRAST  Technique:  Contiguous axial images were obtained from the base of the skull through the vertex without  contrast.  Comparison: MR 06/29/2007  Findings: Atherosclerotic and physiologic intracranial calcifications.  Mild atrophy. There is no evidence of acute intracranial hemorrhage, brain edema, mass lesion, acute infarction,   mass effect, or midline shift. Acute infarct may be inapparent on noncontrast CT.  No other intra-axial abnormalities are seen, and the ventricles and sulci are within normal limits in size and symmetry.   No abnormal extra-axial fluid collections or masses are identified.  No significant calvarial abnormality.  IMPRESSION: 1. Negative for bleed or other acute intracranial process.   Original Report Authenticated By: D. Andria Rhein, MD    Ct Angio Chest Pe W/cm &/or Wo Cm  02/29/2012  *RADIOLOGY REPORT*  Clinical Data: Elevated D-dimer, evaluate for pulmonary embolism  CT ANGIOGRAPHY CHEST  Technique:  Multidetector CT imaging of the chest using the standard protocol during bolus administration of  intravenous contrast. Multiplanar reconstructed images including MIPs were obtained and reviewed to evaluate the vascular anatomy.  Contrast: OMNIPAQUE IOHEXOL 350 MG/ML SOLN  Comparison: Prior chest x-ray 03/19/2009  Findings:  Mediastinum: Unremarkable CT appearance of the thyroid gland. Nonspecific not enlarged by CT criteria but slightly prominent prevascular lymph nodes measuring up to 7 mm in short axis. Mildly patulous thoracic esophagus.  No soft tissue mass.  Heart/Vascular: Opacification of the pulmonary arterial tree to the proximal subsegmental level.  Central filling defects consistent with acute pulmonary emboli identified in the right lower lobe or pulmonary artery extending into the segmental and subsegmental branches.  Additional filling defects identified within the segmental pulmonary arteries supplying the left lower lobe.  There is mild cardiomegaly with right atrial enlargement.  The patient is status post median sternotomy with evidence of prior multivessel CABG.  Atherosclerotic vascular calcifications identified throughout the coronary arteries.  No pericardial effusion.  Lungs/Pleura:  Subsegmental atelectasis in the bilateral lower lobes.  Mild subpleural lipomatosis on the left posteriorly. Evaluation for small pulmonary nodules is limited by respiratory motion.  Mild diffuse bronchial wall thickening.  No suspicious pulmonary nodule.  No pleural effusion or pneumothorax.  Upper Abdomen: Unremarkable visualized upper abdomen.  Bones: No acute fracture or aggressive appearing lytic or blastic osseous lesion.  IMPRESSION:  1.  Positive for pulmonary emboli.  There are bilateral lower lobe pulmonary emboli to the lobar level on the right and segmental level on the left. 2.  Cardiomegaly with right atrial enlargement and surgical changes of prior multi vessel CABG. 3.  Bilateral lower lobe subsegmental atelectasis may be related to VQ mismatch from the pulmonary emboli. 4.  Nonspecific  mediastinal lymph nodes which are not enlarged by CT criteria and likely reactive in nature.  Critical Value/emergent results were called by telephone at the time of interpretation on February 29, 2012 at 07:25 p.m. to Dr. Gwyneth Sprout, who verbally acknowledged these results.   Original Report Authenticated By: Malachy Moan, M.D.     ASSESSMENT AND PLAN   1.  Acute PE with marked elevation of wbc and d dimer with both lobes involved   ---- trop elevation likely secondary to PE. 2.  Headache  --uncertain etiol  -- defer to gen med 3.  Known CAD  --- see number one.  Trop elevated but probably accounted for by PE.   Plan  Will follow with you.  Treatment coordination with OP care should be with her primary regarding anticoagulation   Signed, Shawnie Pons MD, [facility], [facility]

## 2012-03-01 NOTE — Progress Notes (Addendum)
ANTICOAGULATION CONSULT NOTE - Follow Up Consult  Pharmacy Consult for heparin Indication: pulmonary embolus   Labs:  Recent Labs  02/29/12 1334  02/29/12 1825 02/29/12 1959 02/29/12 2105 03/01/12 0300 03/01/12 1015  HGB 13.0  --   --   --   --  11.3*  --   HCT 36.8  --   --   --   --  33.0*  --   PLT 208  --   --   --   --  185  --   LABPROT  --   --   --  16.5*  --  14.7  --   INR  --   --   --  1.37  --  1.17  --   HEPARINUNFRC  --   --   --   --   --  0.27* 0.40  CREATININE 1.28*  --   --   --   --  0.98  --   TROPONINI  --   < > 1.68*  --  1.58* 0.98* 0.62*  < > = values in this interval not displayed.  Assessment: 67yo female with PE who is therapeutic on IV heparin (level 0.4).  INR 1.17 after one warfarin dose of 7.5mg   Goal of Therapy:  Heparin level 0.3-0.7 units/ml INR 2-3   Plan:  Continue heparin at the same rate of 1500 units/hr and recheck a heparin level in 6 hours to confirm.   Will repeat warfarin 7.5mg  today.  Daily protimes, heparin level and CBC  Jamario Colina, Garth Bigness PharmD BCPS 03/01/2012,11:37 AM

## 2012-03-01 NOTE — Progress Notes (Signed)
Transferred patient to 4710 per hospital bed with family at bedside.  Patient denies any discomfort.

## 2012-03-01 NOTE — Progress Notes (Signed)
Pharmacy consult for Heparin Indication: pulmonary embolus  Heparin level = 0.28 on 1500 units/hr Goal heparin level = 0.3-0.7  Heparin level is subtherapeutic but close to desired range.  Will increase the heparin to 1600 units/hr. Will follow up with AM labs.  Cardell Peach, PharmD

## 2012-03-01 NOTE — Progress Notes (Signed)
ANTICOAGULATION CONSULT NOTE - Follow Up Consult  Pharmacy Consult for heparin Indication: pulmonary embolus   Labs:  Recent Labs  02/29/12 1334 02/29/12 1825 02/29/12 1959 02/29/12 2105 03/01/12 0300  HGB 13.0  --   --   --  11.3*  HCT 36.8  --   --   --  33.0*  PLT 208  --   --   --  185  LABPROT  --   --  16.5*  --  14.7  INR  --   --  1.37  --  1.17  HEPARINUNFRC  --   --   --   --  0.27*  CREATININE 1.28*  --   --   --   --   TROPONINI  --  1.68*  --  1.58* 0.98*    Assessment: 67yo female slightly subtherapeutic on heparin with initial dosing for PE; suspect some bolus effect persists and true level may be lower.  Goal of Therapy:  Heparin level 0.3-0.7 units/ml   Plan:  Will increase heparin gtt by 2-3 units/kg/hr to 1500 units/hr and check level in 6hr.  Colleen Can PharmD BCPS 03/01/2012,3:56 AM

## 2012-03-01 NOTE — Progress Notes (Signed)
VASCULAR LAB PRELIMINARY  PRELIMINARY  PRELIMINARY  PRELIMINARY  Bilateral lower extremity venous duplex  completed.    Preliminary report:  Right:  No evidence of DVT, superficial thrombosis, or Baker's cyst.  Left:  No evidence of DVT.  Superficial thrombosis in the GSV from the SFJ through mid calf.  GSV thrombus noted 02-14-12 was only to mid thigh.  Panayiotis Rainville, RVT 03/01/2012, 1:19 PM

## 2012-03-01 NOTE — Progress Notes (Signed)
Spoke with bed control staff, Joni Reining, regarding the delay in transferring Taylor [name] to 4710.  Per receiving nurse, maintenance is working on this assigned room and it will take 30 minutes to have it fixed.

## 2012-03-01 NOTE — Progress Notes (Signed)
Patient is now on telemetry and CMT has been notified of her arrival to unit.  Lorretta Harp RN

## 2012-03-01 NOTE — Progress Notes (Signed)
Utilization Review Completed.  03/01/2012  

## 2012-03-01 NOTE — Progress Notes (Signed)
[facility] Progress Note [place] TEAM 1 - Stepdown/ICU TEAM   Taylor [name] ZOX:[id number] DOB: 19-Apr-1945 DOA: 02/29/2012 PCP: [name], MD  Brief narrative: 67 year old female endorsed chest pain and headache 2 days prior. Awakened early on the morning of admission feeling poorly and began to vomit. Throughout the day she had continued with weakness and fatigue but denied shortness of breath or further chest pain. Also no palpitations. She finally presented to the emergency department. Laboratory data revealed leukocytosis of 26,000 and a mildly elevated troponin of 1.68. CT angiogram revealed bilateral pulmonary emboli. Because of her headache a spinal tap was completed but initial cytology was unremarkable. Urinalysis was also unremarkable. Patient has had recent left lower extremity swelling and pain and has a prior history of superficial venous thrombophlebitis per lower extremity duplex study 02/14/2012.  Assessment/Plan: Principal Problem:   Bilateral pulmonary embolism -Not hypoxic -Leukocytosis likely 2/2 acute PE- no definitive pulmonary infarct seen on CT angio -ECHO pending to evaluate for RV strain -Pharmacy managing anti-coagulation -CM consult to determine Xarelto co-pay  Active Problems:   Coronary artery disease/Cardiac enzymes elevated -Cardiology following -suspects elevated TNI 2/2 PE and not ischemia  -On baby ASA and coreg    DM (diabetes mellitus) type I uncontrolled, periph vascular disorder -HgbA1c elevated at 7.6 -Home Glucophage held 2/2 acute illness/recent IV contrast -CBG controlled -Poor intake so hold home Amaryl o/w cont. SSI    CHRONIC LEFT LEG VENOUS INSUFFICIENCY -h/o prior SVT -Recent OP LE duplex negative for DVT 02/14/12 although had significant superficial thrombosis in left GSV from ankle to mid-thigh --suspect since that time likely propagated into the deep system -Duplex complete-patient noted to have a superficial  thrombosis in the GSV. from [facility]. 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 -declined chemopreventive therapy for the contralateral breast. -last eval. By [facility] 04/22/11 -? DVT/SVT marker of recurrent CA ??    DVT prophylaxis: On full dose anticoagulation for acute pulmonary emboli Code Status: Full Family Communication: Patient Disposition Plan: Telemetry  Consultants: Cardiology  Procedures: Lower extremity venous duplex pending 2-D echocardiogram pending  Antibiotics: None  HPI/Subjective: This continues to endorse generalized malaise and some nausea but denies chest pain or shortness of breath.   Objective: Blood pressure 139/70, pulse 68, temperature 98 F (36.7 C), temperature source Oral, resp. rate 24, height 5\' 7"  (1.702 m), weight 88.5 kg (195 lb 1.7 oz), SpO2 93.00%.  Intake/Output Summary (Last 24 hours) at 03/01/12 1205 Last data filed at 03/01/12 1000  Gross per 24 hour  Intake    151 ml  Output   1151 ml  Net  -1000 ml     Exam: General: No acute respiratory distress Lungs: Clear to auscultation bilaterally without wheezes or crackles Cardiovascular: Regular rate and rhythm without murmur gallop or rub normal S1 and S2, LLE edema non-pitting- no JVD Abdomen: Nontender, nondistended, soft, bowel sounds positive, no rebound, no ascites, no appreciable mass Extremities: No significant cyanosis, clubbing of bilateral lower extremities Neurological: Alert and oriented x3, moves all extremities x4, cranial nerves II through XII grossly intact, exam non-focal  Data Reviewed: Basic Metabolic Panel:  Recent Labs Lab 02/29/12 1334 03/01/12 0300  NA 139 141  K 3.8 3.4*  CL  98 104  CO2 25 26  GLUCOSE 200* 191*  BUN 21 20  CREATININE  1.28* 0.98  CALCIUM 9.1 8.1*   Liver Function Tests:  Recent Labs Lab 02/29/12 1334  AST 92*  ALT 91*  ALKPHOS 71  BILITOT 0.5  PROT 7.9  ALBUMIN 3.7   No results found for this basename: LIPASE, AMYLASE,  in the last 168 hours No results found for this basename: AMMONIA,  in the last 168 hours CBC:  Recent Labs Lab 02/29/12 1334 03/01/12 0300  WBC 26.4* 20.6*  NEUTROABS 22.5*  --   HGB 13.0 11.3*  HCT 36.8 33.0*  MCV 85.6 86.8  PLT 208 185   Cardiac Enzymes:  Recent Labs Lab 02/29/12 1825 02/29/12 2105 03/01/12 0300 03/01/12 1015  TROPONINI 1.68* 1.58* 0.98* 0.62*   BNP (last 3 results) No results found for this basename: PROBNP,  in the last 8760 hours CBG:  Recent Labs Lab 02/29/12 2258 03/01/12 0804  GLUCAP 171* 160*    Recent Results (from the past 240 hour(s))  GRAM STAIN     Status: None   Collection Time    02/29/12  4:55 PM      Result Value Range Status   Specimen Description CSF   Final   Special Requests NONE   Final   Gram Stain     Final   Value: CYTOSPIN PREP     WBC PRESENT, PREDOMINANTLY MONONUCLEAR     NO ORGANISMS SEEN   Report Status 02/29/2012 FINAL   Final  CSF CULTURE     Status: None   Collection Time    02/29/12  4:56 PM      Result Value Range Status   Specimen Description CSF   Final   Special Requests NONE   Final   Gram Stain     Final   Value: RARE WBC PRESENT, PREDOMINANTLY MONONUCLEAR     NO ORGANISMS SEEN     CYTOSPIN Performed at [facility]   Culture NO GROWTH 1 DAY   Final   Report Status PENDING   Incomplete  MRSA PCR SCREENING     Status: None   Collection Time    03/01/12 12:14 AM      Result Value Range Status   MRSA by PCR NEGATIVE  NEGATIVE Final   Comment:            The GeneXpert MRSA Assay (FDA     approved for NASAL specimens     only), is one component of a     comprehensive MRSA colonization     surveillance program. It is not     intended to diagnose MRSA     infection nor  to guide or     monitor treatment for     MRSA infections.     Studies:  Recent x-ray studies have been reviewed in detail by the Attending Physician  Scheduled Meds:  Reviewed in detail by the Attending Physician   Junious Silk, ANP [facility] Office  [phone] Pager [phone]  On-Call/Text Page:      [email or web address]      password [email or web address]  If 7PM-7AM, please contact night-coverage [email or web address] Password [facility] 03/01/2012, 12:05 PM   LOS: 1 day   I have examined the patient, reviewed the chart and modified the above note which I agree with.   Taylor Koman,MD [phone] 03/01/2012, 1:42 PM

## 2012-03-02 DIAGNOSIS — R748 Abnormal levels of other serum enzymes: Secondary | ICD-10-CM | POA: Diagnosis not present

## 2012-03-02 DIAGNOSIS — I2699 Other pulmonary embolism without acute cor pulmonale: Secondary | ICD-10-CM

## 2012-03-02 LAB — GLUCOSE, CAPILLARY
Glucose-Capillary: 165 mg/dL — ABNORMAL HIGH (ref 70–99)
Glucose-Capillary: 218 mg/dL — ABNORMAL HIGH (ref 70–99)

## 2012-03-02 LAB — LUPUS ANTICOAGULANT PANEL
DRVVT: 39.8 secs (ref <42.9)
Lupus Anticoagulant: DETECTED — AB

## 2012-03-02 LAB — CBC
HCT: 35.1 % — ABNORMAL LOW (ref 36.0–46.0)
Hemoglobin: 11.8 g/dL — ABNORMAL LOW (ref 12.0–15.0)
MCH: 29.7 pg (ref 26.0–34.0)
RBC: 3.97 MIL/uL (ref 3.87–5.11)

## 2012-03-02 LAB — HEPARIN LEVEL (UNFRACTIONATED): Heparin Unfractionated: 0.22 IU/mL — ABNORMAL LOW (ref 0.30–0.70)

## 2012-03-02 LAB — PROTEIN C, TOTAL: Protein C, Total: 72 % (ref 72–160)

## 2012-03-02 LAB — FACTOR 5 LEIDEN

## 2012-03-02 LAB — PROTIME-INR: INR: 1.44 (ref 0.00–1.49)

## 2012-03-02 MED ORDER — SODIUM CHLORIDE 0.9 % IV SOLN
16.0000 mg | Freq: Four times a day (QID) | INTRAVENOUS | Status: DC | PRN
  Administered 2012-03-02: 16 mg via INTRAVENOUS
  Filled 2012-03-02 (×3): qty 8

## 2012-03-02 MED ORDER — SODIUM CHLORIDE 0.9 % IV BOLUS (SEPSIS)
500.0000 mL | Freq: Once | INTRAVENOUS | Status: AC
  Administered 2012-03-02: 500 mL via INTRAVENOUS

## 2012-03-02 MED ORDER — MORPHINE SULFATE 2 MG/ML IJ SOLN: 2.0000 mg | INTRAMUSCULAR | Status: DC | PRN

## 2012-03-02 MED ORDER — SODIUM CHLORIDE 0.9 % IV SOLN
INTRAVENOUS | Status: DC
  Administered 2012-03-02 – 2012-03-04 (×4): via INTRAVENOUS

## 2012-03-02 MED ORDER — PROMETHAZINE HCL 25 MG/ML IJ SOLN
25.0000 mg | Freq: Four times a day (QID) | INTRAMUSCULAR | Status: DC | PRN
  Administered 2012-03-02 – 2012-03-03 (×2): 25 mg via INTRAVENOUS
  Filled 2012-03-02 (×2): qty 1

## 2012-03-02 MED ORDER — PROMETHAZINE HCL 25 MG/ML IJ SOLN
12.5000 mg | Freq: Four times a day (QID) | INTRAMUSCULAR | Status: DC | PRN
  Administered 2012-03-02: 12.5 mg via INTRAVENOUS
  Filled 2012-03-02: qty 1

## 2012-03-02 MED ORDER — WARFARIN SODIUM 10 MG PO TABS
10.0000 mg | ORAL_TABLET | Freq: Once | ORAL | Status: AC
  Administered 2012-03-02: 10 mg via ORAL
  Filled 2012-03-02: qty 1

## 2012-03-02 MED ORDER — HEPARIN BOLUS VIA INFUSION
1000.0000 [IU] | Freq: Once | INTRAVENOUS | Status: AC
  Administered 2012-03-02: 1000 [IU] via INTRAVENOUS
  Filled 2012-03-02: qty 1000

## 2012-03-02 MED ORDER — HYDROCODONE-ACETAMINOPHEN 5-325 MG PO TABS
1.0000 | ORAL_TABLET | ORAL | Status: DC | PRN
Start: 2012-03-02 — End: 2012-03-04
  Administered 2012-03-02: 1 via ORAL
  Filled 2012-03-02: qty 1

## 2012-03-02 NOTE — Progress Notes (Signed)
Patient Name: Taylor [name] Date of Encounter: 03/02/2012     Principal Problem:   Bilateral pulmonary embolism Active Problems:   DM (diabetes mellitus) type I uncontrolled, periph vascular disorder   HYPERTENSION   PVD   CHRONIC LEFT LEG VENOUS INSUFFICIENCY   ESOPHAGITIS, REFLUX   Coronary artery disease   History of breast cancer, DCIS, Left mastectomy 03/21/2009.   Cardiac enzymes elevated   Chronic diastolic heart failure, NYHA class 1    SUBJECTIVE  Patient seen earlier and examined.  Remains stable hemodynamically but continues to have pretty substantial bifrontal headache.  Breathing is unchanged.  Results of echo reviewed with the family.  No other symptoms at present.    CURRENT MEDS . aspirin  81 mg Oral Daily  . carvedilol  6.25 mg Oral BID WC  . ezetimibe-simvastatin  1 tablet Oral QHS  . insulin aspart  0-15 Units Subcutaneous TID WC  . sertraline  50 mg Oral Daily  . sodium chloride  500 mL Intravenous Once  . warfarin  10 mg Oral ONCE-1800  . Warfarin - Pharmacist Dosing Inpatient   Does not apply q1800    OBJECTIVE  Filed Vitals:   03/01/12 2044 03/02/12 0503 03/02/12 1145 03/02/12 1326  BP: 137/64 128/74 130/64 144/72  Pulse: 60 64 70 73  Temp: 98.1 F (36.7 C) 98.2 F (36.8 C)  97.9 F (36.6 C)  TempSrc: Oral Oral  Oral  Resp: 20 20  19   Height:      Weight:  184 lb 11.9 oz (83.8 kg)    SpO2: 99% 98%  100%    Intake/Output Summary (Last 24 hours) at 03/02/12 1821 Last data filed at 03/02/12 1313  Gross per 24 hour  Intake 421.48 ml  Output    900 ml  Net -478.52 ml   Filed Weights   03/01/12 0000 03/01/12 1211 03/02/12 0503  Weight: 195 lb 1.7 oz (88.5 kg) 184 lb 15.5 oz (83.9 kg) 184 lb 11.9 oz (83.8 kg)    PHYSICAL EXAM  General: Pleasant, complaining of headache bilateral and frontal.   Neuro: Alert and oriented X 3. Moves all extremities spontaneously. Psych: Normal affect. HEENT:  Normal  Neck: Supple without bruits or  JVD. Lungs:  Resp regular and unlabored, decrease BS in the bases.   Heart: RRR no s3, s4, or murmurs. Abdomen: Soft, non-tender, non-distended, BS + x 4.  Extremities: No clubbing, cyanosis or edema. DP/PT/Radials 2+ and equal bilaterally.  Accessory Clinical Findings  CBC  Recent Labs  02/29/12 1334 03/01/12 0300 03/02/12 0545  WBC 26.4* 20.6* 13.9*  NEUTROABS 22.5*  --   --   HGB 13.0 11.3* 11.8*  HCT 36.8 33.0* 35.1*  MCV 85.6 86.8 88.4  PLT 208 185 198   Basic Metabolic Panel  Recent Labs  02/29/12 1334 03/01/12 0300  NA 139 141  K 3.8 3.4*  CL 98 104  CO2 25 26  GLUCOSE 200* 191*  BUN 21 20  CREATININE 1.28* 0.98  CALCIUM 9.1 8.1*   Liver Function Tests  Recent Labs  02/29/12 1334  AST 92*  ALT 91*  ALKPHOS 71  BILITOT 0.5  PROT 7.9  ALBUMIN 3.7   No results found for this basename: LIPASE, AMYLASE,  in the last 72 hours Cardiac Enzymes  Recent Labs  02/29/12 2105 03/01/12 0300 03/01/12 1015  TROPONINI 1.58* 0.98* 0.62*   BNP No components found with this basename: POCBNP,  D-Dimer  Recent Labs  02/29/12 1553  DDIMER 12.54*   Hemoglobin A1C  Recent Labs  03/01/12 0300  HGBA1C 7.6*   Fasting Lipid Panel No results found for this basename: CHOL, HDL, LDLCALC, TRIG, CHOLHDL, LDLDIRECT,  in the last 72 hours Thyroid Function Tests No results found for this basename: TSH, T4TOTAL, FREET3, T3FREE, THYROIDAB,  in the last 72 hours  TELE  Stable.  NSR.    ECG  NA  Radiology/Studies  Ct Head Wo Contrast  02/29/2012  *RADIOLOGY REPORT*  Clinical Data: Headache, emesis  CT HEAD WITHOUT CONTRAST  Technique:  Contiguous axial images were obtained from the base of the skull through the vertex without contrast.  Comparison: MR 06/29/2007  Findings: Atherosclerotic and physiologic intracranial calcifications.  Mild atrophy. There is no evidence of acute intracranial hemorrhage, brain edema, mass lesion, acute infarction,   mass effect,  or midline shift. Acute infarct may be inapparent on noncontrast CT.  No other intra-axial abnormalities are seen, and the ventricles and sulci are within normal limits in size and symmetry.   No abnormal extra-axial fluid collections or masses are identified.  No significant calvarial abnormality.  IMPRESSION: 1. Negative for bleed or other acute intracranial process.   Original Report Authenticated By: D. Andria Rhein, MD    Ct Angio Chest Pe W/cm &/or Wo Cm  02/29/2012  *RADIOLOGY REPORT*  Clinical Data: Elevated D-dimer, evaluate for pulmonary embolism  CT ANGIOGRAPHY CHEST  Technique:  Multidetector CT imaging of the chest using the standard protocol during bolus administration of intravenous contrast. Multiplanar reconstructed images including MIPs were obtained and reviewed to evaluate the vascular anatomy.  Contrast: OMNIPAQUE IOHEXOL 350 MG/ML SOLN  Comparison: Prior chest x-ray 03/19/2009  Findings:  Mediastinum: Unremarkable CT appearance of the thyroid gland. Nonspecific not enlarged by CT criteria but slightly prominent prevascular lymph nodes measuring up to 7 mm in short axis. Mildly patulous thoracic esophagus.  No soft tissue mass.  Heart/Vascular: Opacification of the pulmonary arterial tree to the proximal subsegmental level.  Central filling defects consistent with acute pulmonary emboli identified in the right lower lobe or pulmonary artery extending into the segmental and subsegmental branches.  Additional filling defects identified within the segmental pulmonary arteries supplying the left lower lobe.  There is mild cardiomegaly with right atrial enlargement.  The patient is status post median sternotomy with evidence of prior multivessel CABG.  Atherosclerotic vascular calcifications identified throughout the coronary arteries.  No pericardial effusion.  Lungs/Pleura:  Subsegmental atelectasis in the bilateral lower lobes.  Mild subpleural lipomatosis on the left posteriorly.  Evaluation for small pulmonary nodules is limited by respiratory motion.  Mild diffuse bronchial wall thickening.  No suspicious pulmonary nodule.  No pleural effusion or pneumothorax.  Upper Abdomen: Unremarkable visualized upper abdomen.  Bones: No acute fracture or aggressive appearing lytic or blastic osseous lesion.  IMPRESSION:  1.  Positive for pulmonary emboli.  There are bilateral lower lobe pulmonary emboli to the lobar level on the right and segmental level on the left. 2.  Cardiomegaly with right atrial enlargement and surgical changes of prior multi vessel CABG. 3.  Bilateral lower lobe subsegmental atelectasis may be related to VQ mismatch from the pulmonary emboli. 4.  Nonspecific mediastinal lymph nodes which are not enlarged by CT criteria and likely reactive in nature.  Critical Value/emergent results were called by telephone at the time of interpretation on February 29, 2012 at 07:25 p.m. to Dr. Gwyneth Sprout, who verbally acknowledged these results.   Original Report Authenticated By: Malachy Moan,  M.D.   ECHO   Study Conclusions  - Left ventricle: The cavity size was normal. Wall thickness was increased in a pattern of mild LVH. Systolic function was normal. The estimated ejection fraction was in the range of 50% to 55%. - Mitral valve: Mild regurgitation.     ASSESSMENT AND PLAN  1.  Ongoing  HA  -- evaluation by Gen Med 2.  Bilateral PE  ---  Stable hemodynamics.  Currently on UFH  -- consideration of novel agents once issue of ha resolved.  3.  No other cardiac wu at present  In setting of PE, and superficial phlebitis, ? Any role of reinvestigation for cancer  Signed, Shawnie Pons MD, [facility], [facility]

## 2012-03-02 NOTE — Progress Notes (Signed)
per [employer], patient has a 20% co-insurance for Xarelto.

## 2012-03-02 NOTE — Progress Notes (Signed)
ANTICOAGULATION CONSULT NOTE - Follow Up Consult  Pharmacy Consult for Heparin Indication: Bilateral PE  Labs:  Recent Labs  02/29/12 1334  02/29/12 1959 02/29/12 2105  03/01/12 0300 03/01/12 1015  03/02/12 0545 03/02/12 1232 03/02/12 1824  HGB 13.0  --   --   --   --  11.3*  --   --  11.8*  --   --   HCT 36.8  --   --   --   --  33.0*  --   --  35.1*  --   --   PLT 208  --   --   --   --  185  --   --  198  --   --   LABPROT  --   --  16.5*  --   --  14.7  --   --  17.2*  --   --   INR  --   --  1.37  --   --  1.17  --   --  1.44  --   --   HEPARINUNFRC  --   --   --   --   < > 0.27* 0.40  < > 0.22* 0.47 0.41  CREATININE 1.28*  --   --   --   --  0.98  --   --   --   --   --   TROPONINI  --   < >  --  1.58*  --  0.98* 0.62*  --   --   --   --   < > = values in this interval not displayed.  Estimated Creatinine Clearance: 63.7 ml/min (by C-G formula based on Cr of 0.98).   Medications:  Heparin 1800 units/hr  Assessment: 67yof on heparin for bilateral PE. Heparin level (0.41) remains therapeutic - continue current rate and follow-up AM heparin level and INR. - No significant bleeding reported  Goal of Therapy:  Heparin level 0.3-0.7 units/ml Monitor platelets by anticoagulation protocol: Yes   Plan:  1. Continue heparin 1800 units/hr (18 ml/hr) 2. Follow-up AM heparin level, CBC and INR  Cleon Dew [phone] 03/02/2012,7:40 PM

## 2012-03-02 NOTE — Progress Notes (Signed)
Patient c/o nausea.  Zofran given x1.  RN will continue to monitor. Louretta Parma, RN

## 2012-03-02 NOTE — Progress Notes (Signed)
ANTICOAGULATION CONSULT NOTE - Follow Up Consult  Pharmacy Consult for Heparin Indication: pulmonary embolus  Allergies  Allergen Reactions  . Niacin Other (See Comments)    burning    Patient Measurements: Height: 5\' 7"  (170.2 cm) Weight: 184 lb 11.9 oz (83.8 kg) (nauseated) IBW/kg (Calculated) : 61.6 Heparin Dosing Weight: 79 kg  Vital Signs: Temp: 97.9 F (36.6 C) (02/19 1326) Temp src: Oral (02/19 1326) BP: 144/72 mmHg (02/19 1326) Pulse Rate: 73 (02/19 1326)  Labs:  Recent Labs  02/29/12 1334  02/29/12 1959 02/29/12 2105  03/01/12 0300 03/01/12 1015 03/01/12 1746 03/02/12 0545 03/02/12 1232  HGB 13.0  --   --   --   --  11.3*  --   --  11.8*  --   HCT 36.8  --   --   --   --  33.0*  --   --  35.1*  --   PLT 208  --   --   --   --  185  --   --  198  --   LABPROT  --   --  16.5*  --   --  14.7  --   --  17.2*  --   INR  --   --  1.37  --   --  1.17  --   --  1.44  --   HEPARINUNFRC  --   --   --   --   < > 0.27* 0.40 0.28* 0.22* 0.47  CREATININE 1.28*  --   --   --   --  0.98  --   --   --   --   TROPONINI  --   < >  --  1.58*  --  0.98* 0.62*  --   --   --   < > = values in this interval not displayed.  Estimated Creatinine Clearance: 63.7 ml/min (by C-G formula based on Cr of 0.98).   Medications:  Infusions:  . heparin 1,800 Units/hr (03/02/12 0717)  . [DISCONTINUED] heparin 1,500 Units/hr (03/01/12 1114)    Assessment: 67 y/o female on heparin to Coumadin bridge for bilateral PE. Heparin level is therapeutic. No bleeding noted, CBC stable from yesterday.  Today is day 3 of heparin to Coumadin bridge. Bridge to continue a minimum of 5 days AND until INR >= 2 for 24 hours.  Goal of Therapy:  Heparin level 0.3-0.7 units/ml Monitor platelets by anticoagulation protocol: Yes   Plan:  -Continue heparin drip at 1800 units/hr -Confirmatory heparin level in 6 hours -Coumadin as previously dosed this morning -Daily heparin level and CBC while on  heparin -Monitor for signs/symptoms of bleeding  [facility], [place].D., BCPS Clinical Pharmacist Pager: [phone] 03/02/2012 1:54 PM

## 2012-03-02 NOTE — Progress Notes (Signed)
ANTICOAGULATION CONSULT NOTE - Follow Up Consult  Pharmacy Consult for heparin, warfarin Indication: pulmonary embolus   Labs:  Recent Labs  02/29/12 1334  02/29/12 1959 02/29/12 2105  03/01/12 0300 03/01/12 1015 03/01/12 1746 03/02/12 0545  HGB 13.0  --   --   --   --  11.3*  --   --  11.8*  HCT 36.8  --   --   --   --  33.0*  --   --  35.1*  PLT 208  --   --   --   --  185  --   --  198  LABPROT  --   --  16.5*  --   --  14.7  --   --  17.2*  INR  --   --  1.37  --   --  1.17  --   --  1.44  HEPARINUNFRC  --   --   --   --   < > 0.27* 0.40 0.28* 0.22*  CREATININE 1.28*  --   --   --   --  0.98  --   --   --   TROPONINI  --   < >  --  1.58*  --  0.98* 0.62*  --   --   < > = values in this interval not displayed.  Assessment: 66yo female with PE is on IV heparin and warfarin. INR (1.37) is trending up, but heparin level (0.22) is below-goal on 1600 units/hr. No problem with line / infusion and no bleeding per RN.   Goal of Therapy:  Heparin level 0.3-0.7 units/ml INR 2-3   Plan:  1. Warfarin 10mg  po today.  2. Heparin IV bolus 1000 units x 1, then increase IV heparin to 1800 units/hr.  3. Heparin level in 6 hours.   Emeline Gins  03/02/2012,6:51 AM

## 2012-03-02 NOTE — Progress Notes (Signed)
Patient continues to have nausea.  Craige Cotta, NP made aware and Phenergan to be given. RN will continue to monitor. Louretta Parma, RN

## 2012-03-02 NOTE — Progress Notes (Signed)
[facility] PROGRESS NOTE Interim History: 67 year old female endorsed chest pain and headache 2 days prior. Awakened early on the morning of admission feeling poorly and began to vomit. Throughout the day she had continued with weakness and fatigue but denied shortness of breath or further chest pain. Also no palpitations. She finally presented to the emergency department. Laboratory data revealed leukocytosis of 26,000 and a mildly elevated troponin of 1.68. CT angiogram revealed bilateral pulmonary emboli. Because of her headache a spinal tap was completed but initial cytology was unremarkable. Urinalysis was also unremarkable. Patient has had recent left lower extremity swelling and pain and has a prior history of superficial venous thrombophlebitis per lower extremity duplex study 02/14/2012.    Assessment/Plan:  *Bilateral pulmonary embolism - Not hypoxic, not tachycardic, not hypotensive.  - Leukocytosis likely 2/2 acute PE-  definitive pulmonary infarct seen on CT angio bilaterally. - ECHO: right cavity size was normal no dilation. Systolic function was normal, EF 55%. -CM consult to determine Xarelto co-pay - doppler lower :Left GSV thrombus noted 02-14-12 was only to mid thigh.  Orthostatic hypotention: - check orthostatic. - NS bolus and IV fluids if positive.  Chronic diastolic heart failure, NYHA class 1 - compensated    HYPERTENSION - controlled.   Cardiac enzymes elevated/ Coronary artery disease: -Cardiology following  -suspects elevated TNI 2/2 PE and not ischemia  -On baby ASA and coreg   DM (diabetes mellitus) type I uncontrolled, periph vascular disorder: -HgbA1c  7.6  -Home Glucophage held 2/2 acute illness/recent IV contrast, resume as an outpatient. -CBG controlled  -Poor intake so hold home Amaryl o/w cont. SSI    Code Status: Full  Family Communication: Patient  Disposition Plan:  Telemetry   Consultants:  cardiology  Procedures:  none  Antibiotics:  none  HPI/Subjective: Patient nauseated with dry hiving   Objective: Filed Vitals:   03/01/12 1211 03/01/12 1719 03/01/12 2044 03/02/12 0503  BP: 121/50 142/58 137/64 128/74  Pulse: 67 65 60 64  Temp: 98 F (36.7 C)  98.1 F (36.7 C) 98.2 F (36.8 C)  TempSrc: Oral  Oral Oral  Resp: 20  20 20   Height: 5\' 7"  (1.702 m)     Weight: 83.9 kg (184 lb 15.5 oz)   83.8 kg (184 lb 11.9 oz)  SpO2: 95% 99% 99% 98%    Intake/Output Summary (Last 24 hours) at 03/02/12 0849 Last data filed at 03/02/12 0624  Gross per 24 hour  Intake 946.48 ml  Output   1550 ml  Net -603.52 ml   Filed Weights   03/01/12 0000 03/01/12 1211 03/02/12 0503  Weight: 88.5 kg (195 lb 1.7 oz) 83.9 kg (184 lb 15.5 oz) 83.8 kg (184 lb 11.9 oz)    Exam:  General: Alert, awake, oriented x3, in no acute distress.  HEENT: No bruits, no goiter.  Heart: Regular rate and rhythm, without murmurs, rubs, gallops.  Lungs: Good air movement, bilateral air movement.  Abdomen: Soft, mild epigastric tenderness to palpation, nondistended, positive bowel sounds.  Neuro: Grossly intact, nonfocal.   Data Reviewed: Basic Metabolic Panel:  Recent Labs Lab 02/29/12 1334 03/01/12 0300  NA 139 141  K 3.8 3.4*  CL 98 104  CO2 25 26  GLUCOSE 200* 191*  BUN 21 20  CREATININE 1.28* 0.98  CALCIUM 9.1 8.1*   Liver Function Tests:  Recent Labs Lab 02/29/12 1334  AST 92*  ALT 91*  ALKPHOS 71  BILITOT 0.5  PROT 7.9  ALBUMIN 3.7  No results found for this basename: LIPASE, AMYLASE,  in the last 168 hours No results found for this basename: AMMONIA,  in the last 168 hours CBC:  Recent Labs Lab 02/29/12 1334 03/01/12 0300 03/02/12 0545  WBC 26.4* 20.6* 13.9*  NEUTROABS 22.5*  --   --   HGB 13.0 11.3* 11.8*  HCT 36.8 33.0* 35.1*  MCV 85.6 86.8 88.4  PLT 208 185 198   Cardiac Enzymes:  Recent Labs Lab 02/29/12 1825  02/29/12 2105 03/01/12 0300 03/01/12 1015  TROPONINI 1.68* 1.58* 0.98* 0.62*   BNP (last 3 results) No results found for this basename: PROBNP,  in the last 8760 hours CBG:  Recent Labs Lab 03/01/12 0804 03/01/12 1138 03/01/12 1617 03/01/12 2126 03/02/12 0554  GLUCAP 160* 178* 226* 165* 165*    Recent Results (from the past 240 hour(s))  GRAM STAIN     Status: None   Collection Time    02/29/12  4:55 PM      Result Value Range Status   Specimen Description CSF   Final   Special Requests NONE   Final   Gram Stain     Final   Value: CYTOSPIN PREP     WBC PRESENT, PREDOMINANTLY MONONUCLEAR     NO ORGANISMS SEEN   Report Status 02/29/2012 FINAL   Final  CSF CULTURE     Status: None   Collection Time    02/29/12  4:56 PM      Result Value Range Status   Specimen Description CSF   Final   Special Requests NONE   Final   Gram Stain     Final   Value: RARE WBC PRESENT, PREDOMINANTLY MONONUCLEAR     NO ORGANISMS SEEN     CYTOSPIN Performed at [facility]   Culture NO GROWTH 1 DAY   Final   Report Status PENDING   Incomplete  MRSA PCR SCREENING     Status: None   Collection Time    03/01/12 12:14 AM      Result Value Range Status   MRSA by PCR NEGATIVE  NEGATIVE Final   Comment:            The GeneXpert MRSA Assay (FDA     approved for NASAL specimens     only), is one component of a     comprehensive MRSA colonization     surveillance program. It is not     intended to diagnose MRSA     infection nor to guide or     monitor treatment for     MRSA infections.     Studies: Ct Head Wo Contrast  02/29/2012  *RADIOLOGY REPORT*  Clinical Data: Headache, emesis  CT HEAD WITHOUT CONTRAST  Technique:  Contiguous axial images were obtained from the base of the skull through the vertex without contrast.  Comparison: MR 06/29/2007  Findings: Atherosclerotic and physiologic intracranial calcifications.  Mild atrophy. There is no evidence of acute intracranial  hemorrhage, brain edema, mass lesion, acute infarction,   mass effect, or midline shift. Acute infarct may be inapparent on noncontrast CT.  No other intra-axial abnormalities are seen, and the ventricles and sulci are within normal limits in size and symmetry.   No abnormal extra-axial fluid collections or masses are identified.  No significant calvarial abnormality.  IMPRESSION: 1. Negative for bleed or other acute intracranial process.   Original Report Authenticated By: D. Andria Rhein, MD    Ct Angio Chest Pe W/cm &/or  Wo Cm  02/29/2012  *RADIOLOGY REPORT*  Clinical Data: Elevated D-dimer, evaluate for pulmonary embolism  CT ANGIOGRAPHY CHEST  Technique:  Multidetector CT imaging of the chest using the standard protocol during bolus administration of intravenous contrast. Multiplanar reconstructed images including MIPs were obtained and reviewed to evaluate the vascular anatomy.  Contrast: OMNIPAQUE IOHEXOL 350 MG/ML SOLN  Comparison: Prior chest x-ray 03/19/2009  Findings:  Mediastinum: Unremarkable CT appearance of the thyroid gland. Nonspecific not enlarged by CT criteria but slightly prominent prevascular lymph nodes measuring up to 7 mm in short axis. Mildly patulous thoracic esophagus.  No soft tissue mass.  Heart/Vascular: Opacification of the pulmonary arterial tree to the proximal subsegmental level.  Central filling defects consistent with acute pulmonary emboli identified in the right lower lobe or pulmonary artery extending into the segmental and subsegmental branches.  Additional filling defects identified within the segmental pulmonary arteries supplying the left lower lobe.  There is mild cardiomegaly with right atrial enlargement.  The patient is status post median sternotomy with evidence of prior multivessel CABG.  Atherosclerotic vascular calcifications identified throughout the coronary arteries.  No pericardial effusion.  Lungs/Pleura:  Subsegmental atelectasis in the bilateral  lower lobes.  Mild subpleural lipomatosis on the left posteriorly. Evaluation for small pulmonary nodules is limited by respiratory motion.  Mild diffuse bronchial wall thickening.  No suspicious pulmonary nodule.  No pleural effusion or pneumothorax.  Upper Abdomen: Unremarkable visualized upper abdomen.  Bones: No acute fracture or aggressive appearing lytic or blastic osseous lesion.  IMPRESSION:  1.  Positive for pulmonary emboli.  There are bilateral lower lobe pulmonary emboli to the lobar level on the right and segmental level on the left. 2.  Cardiomegaly with right atrial enlargement and surgical changes of prior multi vessel CABG. 3.  Bilateral lower lobe subsegmental atelectasis may be related to VQ mismatch from the pulmonary emboli. 4.  Nonspecific mediastinal lymph nodes which are not enlarged by CT criteria and likely reactive in nature.  Critical Value/emergent results were called by telephone at the time of interpretation on February 29, 2012 at 07:25 p.m. to Dr. Gwyneth Sprout, who verbally acknowledged these results.   Original Report Authenticated By: Malachy Moan, M.D.     Scheduled Meds: . aspirin  81 mg Oral Daily  . carvedilol  6.25 mg Oral BID WC  . ezetimibe-simvastatin  1 tablet Oral QHS  . insulin aspart  0-15 Units Subcutaneous TID WC  . sertraline  50 mg Oral Daily  . sodium chloride  3 mL Intravenous Q12H  . warfarin  10 mg Oral ONCE-1800  . Warfarin - Pharmacist Dosing Inpatient   Does not apply q1800   Continuous Infusions: . heparin 1,800 Units/hr (03/02/12 0717)     Marinda Elk  [facility] Pager [phone]. If 8PM-8AM, please contact night-coverage at [email or web address], password [facility] 03/02/2012, 8:49 AM  LOS: 2 days

## 2012-03-03 DIAGNOSIS — E1059 Type 1 diabetes mellitus with other circulatory complications: Secondary | ICD-10-CM | POA: Diagnosis not present

## 2012-03-03 DIAGNOSIS — R748 Abnormal levels of other serum enzymes: Secondary | ICD-10-CM | POA: Diagnosis not present

## 2012-03-03 LAB — CBC
Hemoglobin: 11.1 g/dL — ABNORMAL LOW (ref 12.0–15.0)
MCH: 29.8 pg (ref 26.0–34.0)
MCHC: 34.2 g/dL (ref 30.0–36.0)
MCV: 87.4 fL (ref 78.0–100.0)
RBC: 3.72 MIL/uL — ABNORMAL LOW (ref 3.87–5.11)

## 2012-03-03 LAB — HEPARIN LEVEL (UNFRACTIONATED): Heparin Unfractionated: 0.49 IU/mL (ref 0.30–0.70)

## 2012-03-03 LAB — GLUCOSE, CAPILLARY
Glucose-Capillary: 163 mg/dL — ABNORMAL HIGH (ref 70–99)
Glucose-Capillary: 144 mg/dL — ABNORMAL HIGH (ref 70–99)
Glucose-Capillary: 143 mg/dL — ABNORMAL HIGH (ref 70–99)
Glucose-Capillary: 153 mg/dL — ABNORMAL HIGH (ref 70–99)

## 2012-03-03 MED ORDER — CARVEDILOL 3.125 MG PO TABS
3.1250 mg | ORAL_TABLET | Freq: Two times a day (BID) | ORAL | Status: DC
  Administered 2012-03-03 – 2012-03-04 (×2): 3.125 mg via ORAL
  Filled 2012-03-03 (×4): qty 1

## 2012-03-03 MED ORDER — WARFARIN 0.5 MG HALF TABLET
0.5000 mg | ORAL_TABLET | Freq: Once | ORAL | Status: AC
  Administered 2012-03-03: 0.5 mg via ORAL
  Filled 2012-03-03: qty 1

## 2012-03-03 NOTE — Progress Notes (Addendum)
[facility] PROGRESS NOTE Interim History: 67 year old female endorsed chest pain and headache 2 days prior. Awakened early on the morning of admission feeling poorly and began to vomit. Throughout the day she had continued with weakness and fatigue but denied shortness of breath or further chest pain. Also no palpitations. She finally presented to the emergency department. Laboratory data revealed leukocytosis of 26,000 and a mildly elevated troponin of 1.68. CT angiogram revealed bilateral pulmonary emboli. Because of her headache a spinal tap was completed but initial cytology was unremarkable. Urinalysis was also unremarkable. Patient has had recent left lower extremity swelling and pain and has a prior history of superficial venous thrombophlebitis per lower extremity duplex study 02/14/2012.    Assessment/Plan:  *Bilateral pulmonary embolism - Not hypoxic, not tachycardic, not hypotensive.  - ECHO: right cavity size was normal no dilation. Systolic function was normal, EF 55%. - doppler lower :Left GSV thrombus noted 02-14-12 was only to mid thigh. - [facility] - will need surveillance for occult malignancy ( she had a history of breast cancer as an outpatient ) by oncologist.  Orthostatic hypotension/ Headache: - negative orthostatic, headache and dizziness resolved with IV fluids. + JVD, lungs are clear to auscultation. - KVO IV fluid. - have encourage to drink plenty of fluids.  Chronic diastolic heart failure, NYHA class 1 - compensated.    HYPERTENSION - controlled.   Cardiac enzymes elevated/ Coronary artery disease: -Cardiology following  -suspects elevated 2/2 PE and not ischemia  -On baby ASA and coreg   DM (diabetes mellitus) type I uncontrolled, periph vascular disorder: -HgbA1c  7.6  -Home Glucophage held 2/2 acute illness/recent IV contrast, resume as an outpatient. -CBG controlled  -Poor intake so hold home Amaryl o/w cont. SSI     Code Status: Full  Family Communication: Patient  Disposition Plan: Telemetry   Consultants:  cardiology  Procedures:  none  Antibiotics:  none  HPI/Subjective: Feels better  Objective: Filed Vitals:   03/02/12 1907 03/02/12 1910 03/02/12 2021 03/03/12 0501  BP: 160/70 153/77 141/62 145/67  Pulse: 92 97 65 62  Temp:   97.8 F (36.6 C) 97 F (36.1 C)  TempSrc:   Oral Oral  Resp:   20 19  Height:      Weight:    85.503 kg (188 lb 8 oz)  SpO2:   97% 98%    Intake/Output Summary (Last 24 hours) at 03/03/12 1016 Last data filed at 03/03/12 0505  Gross per 24 hour  Intake  343.9 ml  Output   1275 ml  Net -931.1 ml   Filed Weights   03/01/12 1211 03/02/12 0503 03/03/12 0501  Weight: 83.9 kg (184 lb 15.5 oz) 83.8 kg (184 lb 11.9 oz) 85.503 kg (188 lb 8 oz)    Exam:  General: Alert, awake, oriented x3, in no acute distress.  HEENT: No bruits, no goiter. +JVD. Heart: Regular rate and rhythm, without murmurs, rubs, gallops.  Lungs: Good air movement, bilateral air movement.  Abdomen: Soft, mild epigastric tenderness to palpation, nondistended, positive bowel sounds.  Neuro: Grossly intact, nonfocal.   Data Reviewed: Basic Metabolic Panel:  Recent Labs Lab 02/29/12 1334 03/01/12 0300  NA 139 141  K 3.8 3.4*  CL 98 104  CO2 25 26  GLUCOSE 200* 191*  BUN 21 20  CREATININE 1.28* 0.98  CALCIUM 9.1 8.1*   Liver Function Tests:  Recent Labs Lab 02/29/12 1334  AST 92*  ALT 91*  ALKPHOS 71  BILITOT 0.5  PROT 7.9  ALBUMIN 3.7   No results found for this basename: LIPASE, AMYLASE,  in the last 168 hours No results found for this basename: AMMONIA,  in the last 168 hours CBC:  Recent Labs Lab 02/29/12 1334 03/01/12 0300 03/02/12 0545 03/03/12 0435  WBC 26.4* 20.6* 13.9* 13.8*  NEUTROABS 22.5*  --   --   --   HGB 13.0 11.3* 11.8* 11.1*  HCT 36.8 33.0* 35.1* 32.5*  MCV 85.6 86.8 88.4 87.4  PLT 208 185 198 193   Cardiac Enzymes:  Recent  Labs Lab 02/29/12 1825 02/29/12 2105 03/01/12 0300 03/01/12 1015  TROPONINI 1.68* 1.58* 0.98* 0.62*   BNP (last 3 results) No results found for this basename: PROBNP,  in the last 8760 hours CBG:  Recent Labs Lab 03/02/12 0554 03/02/12 1129 03/02/12 1613 03/02/12 2102 03/03/12 0550  GLUCAP 165* 218* 153* 163* 144*    Recent Results (from the past 240 hour(s))  GRAM STAIN     Status: None   Collection Time    02/29/12  4:55 PM      Result Value Range Status   Specimen Description CSF   Final   Special Requests NONE   Final   Gram Stain     Final   Value: CYTOSPIN PREP     WBC PRESENT, PREDOMINANTLY MONONUCLEAR     NO ORGANISMS SEEN   Report Status 02/29/2012 FINAL   Final  CSF CULTURE     Status: None   Collection Time    02/29/12  4:56 PM      Result Value Range Status   Specimen Description CSF   Final   Special Requests NONE   Final   Gram Stain     Final   Value: RARE WBC PRESENT, PREDOMINANTLY MONONUCLEAR     NO ORGANISMS SEEN     CYTOSPIN Performed at [facility]   Culture NO GROWTH 2 DAYS   Final   Report Status PENDING   Incomplete  MRSA PCR SCREENING     Status: None   Collection Time    03/01/12 12:14 AM      Result Value Range Status   MRSA by PCR NEGATIVE  NEGATIVE Final   Comment:            The GeneXpert MRSA Assay (FDA     approved for NASAL specimens     only), is one component of a     comprehensive MRSA colonization     surveillance program. It is not     intended to diagnose MRSA     infection nor to guide or     monitor treatment for     MRSA infections.     Studies: No results found.  Scheduled Meds: . aspirin  81 mg Oral Daily  . carvedilol  6.25 mg Oral BID WC  . ezetimibe-simvastatin  1 tablet Oral QHS  . insulin aspart  0-15 Units Subcutaneous TID WC  . sertraline  50 mg Oral Daily  . warfarin  0.5 mg Oral ONCE-1800  . Warfarin - Pharmacist Dosing Inpatient   Does not apply q1800   Continuous Infusions: .  sodium chloride 10 mL/hr at 03/03/12 0652  . heparin 1,800 Units/hr (03/02/12 2050)     Marinda Elk  [facility] Pager [phone]. If 8PM-8AM, please contact night-coverage at [email or web address], password [facility] 03/03/2012, 10:16 AM  LOS: 3 days

## 2012-03-03 NOTE — Progress Notes (Signed)
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   Cardiac enzymes elevated   Chronic diastolic heart failure, NYHA class 1    SUBJECTIVE  Patient feels a lot better after IV fluids last pm.  CURRENT MEDS . aspirin  81 mg Oral Daily  . carvedilol  6.25 mg Oral BID WC  . ezetimibe-simvastatin  1 tablet Oral QHS  . insulin aspart  0-15 Units Subcutaneous TID WC  . sertraline  50 mg Oral Daily  . warfarin  0.5 mg Oral ONCE-1800  . Warfarin - Pharmacist Dosing Inpatient   Does not apply q1800    OBJECTIVE  Filed Vitals:   03/02/12 1907 03/02/12 1910 03/02/12 2021 03/03/12 0501  BP: 160/70 153/77 141/62 145/67  Pulse: 92 97 65 62  Temp:   97.8 F (36.6 C) 97 F (36.1 C)  TempSrc:   Oral Oral  Resp:   20 19  Height:      Weight:    188 lb 8 oz (85.503 kg)  SpO2:   97% 98%    Intake/Output Summary (Last 24 hours) at 03/03/12 0906 Last data filed at 03/03/12 0505  Gross per 24 hour  Intake  343.9 ml  Output   1275 ml  Net -931.1 ml   Filed Weights   03/01/12 1211 03/02/12 0503 03/03/12 0501  Weight: 184 lb 15.5 oz (83.9 kg) 184 lb 11.9 oz (83.8 kg) 188 lb 8 oz (85.503 kg)    PHYSICAL EXAM  General: Pleasant, NAD. Neuro: Alert and oriented X 3. Moves all extremities spontaneously. Psych: Normal affect. HEENT:  Normal  Neck: Supple without bruits.  JVP up on exam perhaps 10cm Lungs:  Resp regular and unlabored, decrease BS bilaterally in the bases Heart: RRR no s3, s4, or murmurs. Extremities:  See notes.  Superficial phlebitis.    Accessory Clinical Findings  CBC  Recent Labs  02/29/12 1334  03/02/12 0545 03/03/12 0435  WBC  26.4*  < > 13.9* 13.8*  NEUTROABS 22.5*  --   --   --   HGB 13.0  < > 11.8* 11.1*  HCT 36.8  < > 35.1* 32.5*  MCV 85.6  < > 88.4 87.4  PLT 208  < > 198 193  < > = values in this interval not displayed. Basic Metabolic Panel  Recent Labs  02/29/12 1334 03/01/12 0300  NA 139 141  K 3.8 3.4*  CL 98 104  CO2 25 26  GLUCOSE 200* 191*  BUN 21 20  CREATININE 1.28* 0.98  CALCIUM 9.1 8.1*   Liver Function Tests  Recent Labs  02/29/12 1334  AST 92*  ALT 91*  ALKPHOS 71  BILITOT 0.5  PROT 7.9  ALBUMIN 3.7   No results found for this basename: LIPASE, AMYLASE,  in the last 72 hours Cardiac Enzymes  Recent Labs  02/29/12 2105 03/01/12 0300 03/01/12 1015  TROPONINI 1.58* 0.98* 0.62*   BNP No components found with this basename: POCBNP,  D-Dimer  Recent Labs  02/29/12 1553  DDIMER 12.54*   Hemoglobin A1C  Recent Labs  03/01/12 0300  HGBA1C 7.6*   Fasting Lipid Panel No results found for this  basename: CHOL, HDL, LDLCALC, TRIG, CHOLHDL, LDLDIRECT,  in the last 72 hours Thyroid Function Tests No results found for this basename: TSH, T4TOTAL, FREET3, T3FREE, THYROIDAB,  in the last 72 hours  TELE  NSR  ECG  NA  Radiology/Studies  Ct Head Wo Contrast  02/29/2012  *RADIOLOGY REPORT*  Clinical Data: Headache, emesis  CT HEAD WITHOUT CONTRAST  Technique:  Contiguous axial images were obtained from the base of the skull through the vertex without contrast.  Comparison: MR 06/29/2007  Findings: Atherosclerotic and physiologic intracranial calcifications.  Mild atrophy. There is no evidence of acute intracranial hemorrhage, brain edema, mass lesion, acute infarction,   mass effect, or midline shift. Acute infarct may be inapparent on noncontrast CT.  No other intra-axial abnormalities are seen, and the ventricles and sulci are within normal limits in size and symmetry.   No abnormal extra-axial fluid collections or masses are identified.  No significant calvarial  abnormality.  IMPRESSION: 1. Negative for bleed or other acute intracranial process.   Original Report Authenticated By: D. Andria Rhein, MD    Ct Angio Chest Pe W/cm &/or Wo Cm  02/29/2012  *RADIOLOGY REPORT*  Clinical Data: Elevated D-dimer, evaluate for pulmonary embolism  CT ANGIOGRAPHY CHEST  Technique:  Multidetector CT imaging of the chest using the standard protocol during bolus administration of intravenous contrast. Multiplanar reconstructed images including MIPs were obtained and reviewed to evaluate the vascular anatomy.  Contrast: OMNIPAQUE IOHEXOL 350 MG/ML SOLN  Comparison: Prior chest x-ray 03/19/2009  Findings:  Mediastinum: Unremarkable CT appearance of the thyroid gland. Nonspecific not enlarged by CT criteria but slightly prominent prevascular lymph nodes measuring up to 7 mm in short axis. Mildly patulous thoracic esophagus.  No soft tissue mass.  Heart/Vascular: Opacification of the pulmonary arterial tree to the proximal subsegmental level.  Central filling defects consistent with acute pulmonary emboli identified in the right lower lobe or pulmonary artery extending into the segmental and subsegmental branches.  Additional filling defects identified within the segmental pulmonary arteries supplying the left lower lobe.  There is mild cardiomegaly with right atrial enlargement.  The patient is status post median sternotomy with evidence of prior multivessel CABG.  Atherosclerotic vascular calcifications identified throughout the coronary arteries.  No pericardial effusion.  Lungs/Pleura:  Subsegmental atelectasis in the bilateral lower lobes.  Mild subpleural lipomatosis on the left posteriorly. Evaluation for small pulmonary nodules is limited by respiratory motion.  Mild diffuse bronchial wall thickening.  No suspicious pulmonary nodule.  No pleural effusion or pneumothorax.  Upper Abdomen: Unremarkable visualized upper abdomen.  Bones: No acute fracture or aggressive appearing lytic  or blastic osseous lesion.  IMPRESSION:  1.  Positive for pulmonary emboli.  There are bilateral lower lobe pulmonary emboli to the lobar level on the right and segmental level on the left. 2.  Cardiomegaly with right atrial enlargement and surgical changes of prior multi vessel CABG. 3.  Bilateral lower lobe subsegmental atelectasis may be related to VQ mismatch from the pulmonary emboli. 4.  Nonspecific mediastinal lymph nodes which are not enlarged by CT criteria and likely reactive in nature.  Critical Value/emergent results were called by telephone at the time of interpretation on February 29, 2012 at 07:25 p.m. to Dr. Gwyneth Sprout, who verbally acknowledged these results.   Original Report Authenticated By: Malachy Moan, M.D.     ASSESSMENT AND PLAN  1.  Sig bilateral pulm emboli with evidence of RV pressure elevation on exam    ----   ?  any relationship to breast CA.  Followed at [facility].   2.  Known CAD with prior CABG    She will need follow up her primary care for warfarin care.  This should not be done at [facility].  Discussed with Dr. Robb Matar  Signed, Shawnie Pons MD, [facility], [facility]

## 2012-03-03 NOTE — Progress Notes (Signed)
ANTICOAGULATION CONSULT NOTE - Follow Up Consult  Pharmacy Consult for Heparin and Coumadin Indication: pulmonary embolus  Allergies  Allergen Reactions  . Niacin Other (See Comments)    burning    Patient Measurements: Height: 5\' 7"  (170.2 cm) Weight: 188 lb 8 oz (85.503 kg) (a scale) IBW/kg (Calculated) : 61.6 Heparin Dosing Weight: 79 kg  Vital Signs: Temp: 97 F (36.1 C) (02/20 0501) Temp src: Oral (02/20 0501) BP: 145/67 mmHg (02/20 0501) Pulse Rate: 62 (02/20 0501)  Labs:  Recent Labs  02/29/12 1334  02/29/12 2105  03/01/12 0300 03/01/12 1015  03/02/12 0545 03/02/12 1232 03/02/12 1824 03/03/12 0435  HGB 13.0  --   --   --  11.3*  --   --  11.8*  --   --  11.1*  HCT 36.8  --   --   --  33.0*  --   --  35.1*  --   --  32.5*  PLT 208  --   --   --  185  --   --  198  --   --  193  LABPROT  --   < >  --   --  14.7  --   --  17.2*  --   --  25.4*  INR  --   < >  --   --  1.17  --   --  1.44  --   --  2.44*  HEPARINUNFRC  --   --   --   < > 0.27* 0.40  < > 0.22* 0.47 0.41 0.49  CREATININE 1.28*  --   --   --  0.98  --   --   --   --   --   --   TROPONINI  --   < > 1.58*  --  0.98* 0.62*  --   --   --   --   --   < > = values in this interval not displayed.  Estimated Creatinine Clearance: 64.3 ml/min (by C-G formula based on Cr of 0.98).   Medications:  Infusions:  . sodium chloride 10 mL/hr at 03/03/12 4098  . heparin 1,800 Units/hr (03/02/12 2050)    Assessment: 67 y/o female on heparin to Coumadin bridge for bilateral PE. Heparin level is therapeutic. INR had rapid increase from yesterday, 1.44 --> 2.44. No bleeding noted, CBC stable from yesterday.  Today is day 4 of heparin to Coumadin bridge. Bridge to continue a minimum of 5 days AND until INR >= 2 for 24 hours.  Goal of Therapy:  INR 2-3 Heparin level 0.3-0.7 units/ml Monitor platelets by anticoagulation protocol: Yes   Plan:  -Continue heparin drip at 1800 units/hr -Coumadin 0.5 mg po  tonight -Daily heparin level, INR, and CBC while on heparin -Monitor for signs/symptoms of bleeding  [facility], [address].D., BCPS Clinical Pharmacist Pager: [phone] 03/03/2012 8:42 AM

## 2012-03-04 DIAGNOSIS — I2699 Other pulmonary embolism without acute cor pulmonale: Secondary | ICD-10-CM

## 2012-03-04 LAB — GLUCOSE, CAPILLARY: Glucose-Capillary: 151 mg/dL — ABNORMAL HIGH (ref 70–99)

## 2012-03-04 LAB — PROTIME-INR
INR: 3.33 — ABNORMAL HIGH (ref 0.00–1.49)
Prothrombin Time: 31.9 seconds — ABNORMAL HIGH (ref 11.6–15.2)

## 2012-03-04 LAB — CSF CULTURE W GRAM STAIN: Culture: NO GROWTH

## 2012-03-04 LAB — CBC
Hemoglobin: 11.3 g/dL — ABNORMAL LOW (ref 12.0–15.0)
MCH: 29.8 pg (ref 26.0–34.0)
Platelets: 232 10*3/uL (ref 150–400)
RBC: 3.79 MIL/uL — ABNORMAL LOW (ref 3.87–5.11)
WBC: 11.6 10*3/uL — ABNORMAL HIGH (ref 4.0–10.5)

## 2012-03-04 LAB — HEPARIN LEVEL (UNFRACTIONATED): Heparin Unfractionated: 0.35 IU/mL (ref 0.30–0.70)

## 2012-03-04 MED ORDER — ONDANSETRON 8 MG PO TBDP: 8.0000 mg | ORAL_TABLET | Freq: Three times a day (TID) | ORAL | Status: DC | PRN

## 2012-03-04 MED ORDER — WARFARIN SODIUM 5 MG PO TABS: ORAL_TABLET | ORAL | Status: DC

## 2012-03-04 MED ORDER — HYDROCODONE-ACETAMINOPHEN 5-325 MG PO TABS: 1.0000 | ORAL_TABLET | ORAL | Status: DC | PRN

## 2012-03-04 MED ORDER — WARFARIN 0.5 MG HALF TABLET
0.5000 mg | ORAL_TABLET | Freq: Once | ORAL | Status: AC
  Administered 2012-03-04: 0.5 mg via ORAL
  Filled 2012-03-04: qty 1

## 2012-03-04 NOTE — Care Management Note (Addendum)
    Page 1 of 2   03/04/2012     2:48:43 PM   CARE MANAGEMENT NOTE 03/04/2012  Patient:  Taylor [name], Taylor [name]   Account Number:  [id number]  Date Initiated:  03/02/2012  Documentation initiated by:  [facility]  Subjective/Objective Assessment:   67 y.o. female who presents with c/o chest pain and headache onset 2 days ago     Action/Plan:   Home with spouse/ CM to set up [facility] services   Anticipated DC Date:  03/04/2012   Anticipated DC Plan:  HOME W HOME HEALTH SERVICES      DC Planning Services  CM consult      [facility] Choice  HOME HEALTH   Choice offered to / List presented to:  C-1 Patient        HH arranged  HH-2 PT  HH-3 OT      [facility] agency  [facility]   Status of service:  Completed, signed off Medicare Important Message given?   (If response is "NO", the following Medicare IM given date fields will be blank) Date Medicare IM given:   Date Additional Medicare IM given:    Discharge Disposition:  HOME W HOME HEALTH SERVICES  Per UR Regulation:  Reviewed for med. necessity/level of care/duration of stay  If discussed at Long Length of Stay Meetings, dates discussed:    Comments:  03/04/12-1442-J.Cristin Penaflor,RN,BSN [phone]      Call placed again to Ms. Steinfeldt. Information given to patient via phone regarding appointment. Voiced understanding. Information also left on answering machine.  03/04/12-1400-J.Steve Youngberg,RN,BSN [phone]      Spoke with Morrie Sheldon at [facility] to schedule patient with coumadin clinic. Appointment made for this coming Monday, Feb.24,2014 at 0945. Call placed to patient at [phone] with answering machine answering. Message left with details.  03/04/12- 1230- Donn Pierini RN, BSN [phone] PT recommending HH-PT- notified MD and orders for HH-PT/OT- spoke with pt at bedside regarding [facility] agency of choice- per pt she has used [facility] in past with her mother-n-law and this is who she would like to use for her services. Referral called to Hilda Lias with [facility]  for HH-PT/OT - services to begin within 24-48 hr. post discharge.   03/02/12 @ 1445.Marland KitchenMarland KitchenOletta Cohn, RN, BSN, [place] [phone] Back in to speak with patient about [facility] services.  Pt very drowsy as visitors report that she was just given a Darvocet for her headache.  Will check in tomorrow.  03/02/12 @ 1100.Marland KitchenMarland KitchenOletta Cohn, RN, BSN, [place] [phone] spoke with patient about receiving [facility] services.  Pt agreeable, but did not want to make decision on agency until after speaking with husband.  Will check with her later

## 2012-03-04 NOTE — Evaluation (Signed)
Physical Therapy Evaluation Patient Details Name: Taylor [name] MRN: [id number] DOB: 17-Sep-1945 Today's Date: 03/04/2012 Time: 4098-1191 PT Time Calculation (min): 17 min  PT Assessment / Plan / Recommendation Clinical Impression  67 y/o female adm. for chest pain and headache found to have bilateral PE. Presents to PT with below impairments affecting independence with mobility. Pt with plans to d/c home today. Rec. HHPT for f/u as well a supervision for mobility/OOB. Pt aware.     PT Assessment  All further PT needs can be met in the next venue of care    Follow Up Recommendations  Home health PT;Supervision for mobility/OOB    Does the patient have the potential to tolerate intense rehabilitation      Barriers to Discharge        Equipment Recommendations  None recommended by PT    Recommendations for Other Services     Frequency      Precautions / Restrictions Precautions Precautions: Fall Restrictions Weight Bearing Restrictions: No   Pertinent Vitals/Pain 2/10 headache; pt also having dry heaves?? Reports this has been going on for a few days      Mobility  Bed Mobility Bed Mobility: Supine to Sit Supine to Sit: 6: Modified independent (Device/Increase time);HOB elevated (30 degrees) Transfers Transfers: Sit to Stand;Stand to Sit Sit to Stand: 5: Supervision;With upper extremity assist;From bed Stand to Sit: With upper extremity assist;To bed;6: Modified independent (Device/Increase time) Details for Transfer Assistance: pt needing to use upper extremities for stability Ambulation/Gait Ambulation/Gait Assistance: 4: Min guard;4: Min assist Ambulation Distance (Feet): 60 Feet Assistive device: None Ambulation/Gait Assistance Details: gaurding for stability and occasionally minA to correct imbalance (tends to be to the right as she favors her left lower extremity during gait) Gait Pattern: Step-through pattern Gait velocity: decreased General Gait Details:  reachign for upper extremity support during ambulation for improved stability (rail in the hall, sink/counter); generally weak appearing gait with decreased step height and length Stairs: Yes Stairs Assistance: 5: Supervision Stairs Assistance Details (indicate cue type and reason): gaurding for safety, no physical assist needed Stair Management Technique: One rail Right Number of Stairs: 1         PT Diagnosis: Difficulty walking;Abnormality of gait;Generalized weakness  PT Problem List: Decreased strength;Decreased activity tolerance;Decreased balance;Decreased mobility;Cardiopulmonary status limiting activity PT Treatment Interventions:   To be addressed by HHPT  PT Goals    Visit Information  Last PT Received On: 03/04/12 Assistance Needed: +1    Subjective Data  Subjective: Im just worried Im not going to get back in time and I am going to die if this happens again.  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, advertising Overall Cognitive Status: Appears within functional limits for tasks assessed/performed Arousal/Alertness: Awake/alert Orientation Level: Appears intact for tasks assessed Behavior During Session: Anxious    Extremity/Trunk Assessment Right Upper Extremity Assessment RUE ROM/Strength/Tone: Within functional levels Left Upper Extremity Assessment LUE ROM/Strength/Tone: Within functional levels Right Lower Extremity Assessment RLE ROM/Strength/Tone: Within functional levels RLE Sensation: WFL - Light Touch Left Lower Extremity  Assessment LLE ROM/Strength/Tone: Deficits LLE ROM/Strength/Tone Deficits: pt tends to favor this  lower extremity during ambulation reporting slight weakness, grossly 4-4+/5 LLE Sensation: WFL - Light Touch   Balance    End of Session PT - End of Session Equipment Utilized During Treatment: Gait belt Activity Tolerance: Patient tolerated treatment well Patient left: in bed;with call bell/phone within reach Nurse Communication: Mobility status  GP     [facility] [name] 03/04/2012, 12:16 PM

## 2012-03-04 NOTE — Discharge Summary (Signed)
Physician Discharge Summary  Taylor [name] OZH:[id number] DOB: [birth year] DOA: 02/29/2012  PCP: Taylor Floro, MD  Admit date: 02/29/2012 Discharge date: 03/04/2012  Time spent: 30 minutes  Recommendations for Outpatient Follow-up:  1. 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   Cardiac enzymes elevated   Discharge Condition: stable  Diet recommendation: regular  Filed Weights   03/02/12 0503 03/03/12 0501 03/04/12 0412  Weight: 83.8 kg (184 lb 11.9 oz) 85.503 kg (188 lb 8 oz) 85.458 kg (188 lb 6.4 oz)    History of present illness:  67 y.o. female who presents with c/o chest pain and headache onset 2 days ago. Went to bed last night because she wasn't feeling well but woke up later with vomiting that lasted until 0500 this morning. She presented to the ED with continued feelings of weakness and fatigue, had no shortness of breath, chest pain, palpatations.  A large workup was performed in the ED which ultimately demonstrated leukocytosis of 26k, troponin of 1.68, and PE on CT chest. Other labs performed include an unremarkable spinal tap and UA. Hospitalist has been asked to admit the patient for PE.   Hospital Course:  *Bilateral pulmonary embolism  - Not hypoxic, not tachycardic, not hypotensive.  - ECHO: right cavity size was normal no dilation. Systolic function was normal, EF 55%.  - doppler lower :Left GSV thrombus noted 02-14-12 was only to mid thigh.  - [facility]  - will need surveillance for occult malignancy ( she had a history of breast cancer as an outpatient ) by oncologist.    Orthostatic hypotension/ Headache:  - negative orthostatic, headache and dizziness resolved with IV fluids. + JVD, lungs are clear to auscultation.  - resolved with IV fluids. - have encourage to drink plenty of fluids. This is most likely 2/2 to PE. Her vitals have remained stable through out her hospital stay.  Chronic diastolic heart failure, NYHA class 1  - compensated.  - continue home meds.  HYPERTENSION  - controlled.   Cardiac enzymes elevated/ Coronary artery disease:  -Cardiology following  -suspects elevated 2/2 PE and not ischemia  -On baby ASA and coreg   DM (diabetes mellitus) type I uncontrolled, periph vascular disorder:  -HgbA1c 7.6  -Home Glucophage held 2/2 acute illness/recent IV contrast, resume as an outpatient.  -CBG controlled  -Poor intake so hold home Amaryl o/w cont. SSI    Procedures: Ct angio of chest : Positive for pulmonary emboli. There are bilateral lower lobe pulmonary emboli to the lobar level on the right and segmental level on the left  Consultations:  cardiology  Discharge Exam: Filed Vitals:   03/03/12 0501 03/03/12 1300 03/03/12 2209 03/04/12 0412  BP: 145/67 136/61 152/71 145/66  Pulse: 62 66 59 60  Temp: 97 F (36.1 C) 97.6 F (36.4 C) 97.9 F (36.6 C) 97.6 F (36.4 C)  TempSrc: Oral Oral Oral Oral  Resp: 19 16 20 19   Height:      Weight: 85.503 kg (188 lb 8 oz)   85.458 kg (188 lb 6.4 oz)  SpO2: 98% 98% 95% 94%  General: A&O x3 Cardiovascular: RRR + JVD Respiratory: good air movement CTA B/L  Discharge Instructions      Discharge Orders   Future Appointments Provider Department Dept Phone   04/21/2012 9:30 AM Mauri [facility] [phone]   04/21/2012 10:00 AM Victorino December, MD [facility] MEDICAL ONCOLOGY [phone]   06/01/2012 1:00 PM Sherrie George, MD [facility] [phone]   Future Orders Complete By Expires     Diet  - low sodium heart healthy  As directed     Diet - low sodium heart healthy  As directed     Increase activity slowly  As directed     Increase activity slowly  As directed         Medication List    TAKE these medications       ALPRAZolam 0.25 MG tablet  Commonly known as:  XANAX  Take 0.25 mg by mouth 3 (three) times daily as needed. For anxiety     aspirin 81 MG tablet  Take 81 mg by mouth daily.     carvedilol 6.25 MG tablet  Commonly known as:  COREG  Take 6.25 mg by mouth 2 (two) times daily with a meal.     ezetimibe-simvastatin 10-40 MG per tablet  Commonly known as:  VYTORIN  Take 1 tablet by mouth at bedtime.     glimepiride 2 MG tablet  Commonly known as:  AMARYL  Take 2 mg by mouth at bedtime.     HYDROcodone-acetaminophen 5-325 MG per tablet  Commonly known as:  NORCO/VICODIN  Take 1-2 tablets by mouth every 4 (four) hours as needed.     metFORMIN 500 MG 24 hr tablet  Commonly known as:  GLUCOPHAGE-XR  Take 1,000 mg by mouth 2 (two) times daily.     ondansetron 8 MG disintegrating tablet  Commonly known as:  ZOFRAN ODT  Take 1 tablet (8 mg total) by mouth every 8 (eight) hours as needed for nausea.     sertraline 50 MG tablet  Commonly known as:  ZOLOFT  Take 50 mg by mouth daily.     warfarin 5 MG tablet  Commonly known as:  COUMADIN  Take 0.5 tab (2.5 mg) on 2.21.2014, then 1 tab daily.       Follow-up Information   Follow up with Taylor Floro, MD In 2 weeks. (hospital follow up)    Contact information:   [address] [place] [address] [phone]       Follow up with Drue Second, MD In 2 weeks. (hospital follow up survillance for recurrence CA)    Contact information:   [address] [place] [address] [phone]        The results of significant diagnostics from this hospitalization (including imaging, microbiology, ancillary and laboratory) are listed below for reference.    Significant Diagnostic  Studies: Ct Head Wo Contrast  02/29/2012  *RADIOLOGY REPORT*  Clinical Data: Headache, emesis  CT HEAD WITHOUT CONTRAST  Technique:  Contiguous axial images were obtained from the base of the skull through the vertex without contrast.  Comparison: MR 06/29/2007  Findings: Atherosclerotic and physiologic intracranial calcifications.  Mild atrophy. There is no evidence of acute intracranial hemorrhage, brain edema, mass lesion, acute infarction,   mass effect, or midline shift. Acute infarct may be inapparent on noncontrast CT.  No other intra-axial abnormalities are seen, and the ventricles and sulci are within  normal limits in size and symmetry.   No abnormal extra-axial fluid collections or masses are identified.  No significant calvarial abnormality.  IMPRESSION: 1. Negative for bleed or other acute intracranial process.   Original Report Authenticated By: D. Andria Rhein, MD    Ct Angio Chest Pe W/cm &/or Wo Cm  02/29/2012  *RADIOLOGY REPORT*  Clinical Data: Elevated D-dimer, evaluate for pulmonary embolism  CT ANGIOGRAPHY CHEST  Technique:  Multidetector CT imaging of the chest using the standard protocol during bolus administration of intravenous contrast. Multiplanar reconstructed images including MIPs were obtained and reviewed to evaluate the vascular anatomy.  Contrast: OMNIPAQUE IOHEXOL 350 MG/ML SOLN  Comparison: Prior chest x-ray 03/19/2009  Findings:  Mediastinum: Unremarkable CT appearance of the thyroid gland. Nonspecific not enlarged by CT criteria but slightly prominent prevascular lymph nodes measuring up to 7 mm in short axis. Mildly patulous thoracic esophagus.  No soft tissue mass.  Heart/Vascular: Opacification of the pulmonary arterial tree to the proximal subsegmental level.  Central filling defects consistent with acute pulmonary emboli identified in the right lower lobe or pulmonary artery extending into the segmental and subsegmental branches.  Additional filling defects  identified within the segmental pulmonary arteries supplying the left lower lobe.  There is mild cardiomegaly with right atrial enlargement.  The patient is status post median sternotomy with evidence of prior multivessel CABG.  Atherosclerotic vascular calcifications identified throughout the coronary arteries.  No pericardial effusion.  Lungs/Pleura:  Subsegmental atelectasis in the bilateral lower lobes.  Mild subpleural lipomatosis on the left posteriorly. Evaluation for small pulmonary nodules is limited by respiratory motion.  Mild diffuse bronchial wall thickening.  No suspicious pulmonary nodule.  No pleural effusion or pneumothorax.  Upper Abdomen: Unremarkable visualized upper abdomen.  Bones: No acute fracture or aggressive appearing lytic or blastic osseous lesion.  IMPRESSION:  1.  Positive for pulmonary emboli.  There are bilateral lower lobe pulmonary emboli to the lobar level on the right and segmental level on the left. 2.  Cardiomegaly with right atrial enlargement and surgical changes of prior multi vessel CABG. 3.  Bilateral lower lobe subsegmental atelectasis may be related to VQ mismatch from the pulmonary emboli. 4.  Nonspecific mediastinal lymph nodes which are not enlarged by CT criteria and likely reactive in nature.  Critical Value/emergent results were called by telephone at the time of interpretation on February 29, 2012 at 07:25 p.m. to Dr. Gwyneth Sprout, who verbally acknowledged these results.   Original Report Authenticated By: Malachy Moan, M.D.     Microbiology: Recent Results (from the past 240 hour(s))  GRAM STAIN     Status: None   Collection Time    02/29/12  4:55 PM      Result Value Range Status   Specimen Description CSF   Final   Special Requests NONE   Final   Gram Stain     Final   Value: CYTOSPIN PREP     WBC PRESENT, PREDOMINANTLY MONONUCLEAR     NO ORGANISMS SEEN   Report Status 02/29/2012 FINAL   Final  CSF CULTURE     Status: None   Collection  Time    02/29/12  4:56 PM      Result Value Range Status   Specimen Description CSF   Final   Special Requests NONE   Final   Gram Stain     Final   Value: RARE WBC PRESENT, PREDOMINANTLY MONONUCLEAR     NO ORGANISMS SEEN  CYTOSPIN Performed at [facility]   Culture NO GROWTH 3 DAYS   Final   Report Status PENDING   Incomplete  MRSA PCR SCREENING     Status: None   Collection Time    03/01/12 12:14 AM      Result Value Range Status   MRSA by PCR NEGATIVE  NEGATIVE Final   Comment:            The GeneXpert MRSA Assay (FDA     approved for NASAL specimens     only), is one component of a     comprehensive MRSA colonization     surveillance program. It is not     intended to diagnose MRSA     infection nor to guide or     monitor treatment for     MRSA infections.     Labs: Basic Metabolic Panel:  Recent Labs Lab 02/29/12 1334 03/01/12 0300  NA 139 141  K 3.8 3.4*  CL 98 104  CO2 25 26  GLUCOSE 200* 191*  BUN 21 20  CREATININE 1.28* 0.98  CALCIUM 9.1 8.1*   Liver Function Tests:  Recent Labs Lab 02/29/12 1334  AST 92*  ALT 91*  ALKPHOS 71  BILITOT 0.5  PROT 7.9  ALBUMIN 3.7   No results found for this basename: LIPASE, AMYLASE,  in the last 168 hours No results found for this basename: AMMONIA,  in the last 168 hours CBC:  Recent Labs Lab 02/29/12 1334 03/01/12 0300 03/02/12 0545 03/03/12 0435 03/04/12 0505  WBC 26.4* 20.6* 13.9* 13.8* 11.6*  NEUTROABS 22.5*  --   --   --   --   HGB 13.0 11.3* 11.8* 11.1* 11.3*  HCT 36.8 33.0* 35.1* 32.5* 32.4*  MCV 85.6 86.8 88.4 87.4 85.5  PLT 208 185 198 193 232   Cardiac Enzymes:  Recent Labs Lab 02/29/12 1825 02/29/12 2105 03/01/12 0300 03/01/12 1015  TROPONINI 1.68* 1.58* 0.98* 0.62*   BNP: BNP (last 3 results) No results found for this basename: PROBNP,  in the last 8760 hours CBG:  Recent Labs Lab 03/03/12 0550 03/03/12 1149 03/03/12 1631 03/03/12 2205 03/04/12 0613   GLUCAP 144* 214* 143* 153* 151*       Signed:  Marinda Elk  [facility] 03/04/2012, 9:45 AM

## 2012-03-04 NOTE — Progress Notes (Signed)
ANTICOAGULATION CONSULT NOTE - Follow Up Consult  Pharmacy Consult for Heparin and Coumadin Indication: pulmonary embolus  Allergies  Allergen Reactions  . Niacin Other (See Comments)    burning    Patient Measurements: Height: 5\' 7"  (170.2 cm) Weight: 188 lb 6.4 oz (85.458 kg) (a scale) IBW/kg (Calculated) : 61.6 Heparin Dosing Weight: 79 kg  Vital Signs: Temp: 97.6 F (36.4 C) (02/21 0412) Temp src: Oral (02/21 0412) BP: 145/66 mmHg (02/21 0412) Pulse Rate: 60 (02/21 0412)  Labs:  Recent Labs  03/01/12 1015  03/02/12 0545  03/02/12 1824 03/03/12 0435 03/04/12 0505  HGB  --   < > 11.8*  --   --  11.1* 11.3*  HCT  --   --  35.1*  --   --  32.5* 32.4*  PLT  --   --  198  --   --  193 232  LABPROT  --   --  17.2*  --   --  25.4* 31.9*  INR  --   --  1.44  --   --  2.44* 3.33*  HEPARINUNFRC 0.40  < > 0.22*  < > 0.41 0.49 0.35  TROPONINI 0.62*  --   --   --   --   --   --   < > = values in this interval not displayed.  Estimated Creatinine Clearance: 64.3 ml/min (by C-G formula based on Cr of 0.98).   Medications:  Infusions:  . sodium chloride 10 mL/hr at 03/03/12 0652  . [DISCONTINUED] heparin Stopped (03/04/12 9604)    Assessment: 67 y/o female on heparin to Coumadin bridge for bilateral PE. Heparin level is therapeutic. INR rapidly increasing and slightly supratherapeutic today, 1.44 --> 2.44--> 3.33. No bleeding noted, CBC stable.  Today is day 5 of 5 heparin to Coumadin bridge and INR >2 x 24 hours. Bridge is complete.  Goal of Therapy:  INR 2-3 Heparin level 0.3-0.7 units/ml Monitor platelets by anticoagulation protocol: Yes   Plan:  -Discontinue heparin drip -Coumadin 0.5 mg po today -Daily INR -Monitor for signs/symptoms of bleeding -Recommend discharge on 5 mg tablets, to take 2.5 mg on 2/22, then 5 mg daily until seen by MD to check INR  [facility], [place].D., BCPS Clinical Pharmacist Pager: [phone] 03/04/2012 9:34 AM

## 2012-03-05 ENCOUNTER — Emergency Department (HOSPITAL_COMMUNITY)
Admission: EM | Admit: 2012-03-05 | Discharge: 2012-03-05 | Disposition: A | Discharge status: Home / Self Care | Payer: Medicare Other | Attending: Emergency Medicine | Admitting: Emergency Medicine

## 2012-03-05 ENCOUNTER — Encounter (HOSPITAL_COMMUNITY): Payer: Self-pay | Admitting: Nurse Practitioner

## 2012-03-05 ENCOUNTER — Emergency Department (HOSPITAL_COMMUNITY): Payer: Medicare Other

## 2012-03-05 DIAGNOSIS — G43909 Migraine, unspecified, not intractable, without status migrainosus: Secondary | ICD-10-CM | POA: Diagnosis not present

## 2012-03-05 DIAGNOSIS — Z951 Presence of aortocoronary bypass graft: Secondary | ICD-10-CM | POA: Diagnosis not present

## 2012-03-05 DIAGNOSIS — R1110 Vomiting, unspecified: Secondary | ICD-10-CM | POA: Diagnosis not present

## 2012-03-05 DIAGNOSIS — H53149 Visual discomfort, unspecified: Secondary | ICD-10-CM | POA: Diagnosis not present

## 2012-03-05 DIAGNOSIS — E119 Type 2 diabetes mellitus without complications: Secondary | ICD-10-CM | POA: Diagnosis not present

## 2012-03-05 DIAGNOSIS — Z853 Personal history of malignant neoplasm of breast: Secondary | ICD-10-CM | POA: Insufficient documentation

## 2012-03-05 DIAGNOSIS — Z79899 Other long term (current) drug therapy: Secondary | ICD-10-CM | POA: Insufficient documentation

## 2012-03-05 DIAGNOSIS — Z86711 Personal history of pulmonary embolism: Secondary | ICD-10-CM | POA: Diagnosis not present

## 2012-03-05 DIAGNOSIS — Z7901 Long term (current) use of anticoagulants: Secondary | ICD-10-CM | POA: Insufficient documentation

## 2012-03-05 DIAGNOSIS — Z87891 Personal history of nicotine dependence: Secondary | ICD-10-CM | POA: Diagnosis not present

## 2012-03-05 DIAGNOSIS — Z8719 Personal history of other diseases of the digestive system: Secondary | ICD-10-CM | POA: Insufficient documentation

## 2012-03-05 DIAGNOSIS — I2510 Atherosclerotic heart disease of native coronary artery without angina pectoris: Secondary | ICD-10-CM | POA: Insufficient documentation

## 2012-03-05 DIAGNOSIS — I10 Essential (primary) hypertension: Secondary | ICD-10-CM | POA: Insufficient documentation

## 2012-03-05 DIAGNOSIS — E7800 Pure hypercholesterolemia, unspecified: Secondary | ICD-10-CM | POA: Insufficient documentation

## 2012-03-05 DIAGNOSIS — F329 Major depressive disorder, single episode, unspecified: Secondary | ICD-10-CM | POA: Diagnosis not present

## 2012-03-05 DIAGNOSIS — I252 Old myocardial infarction: Secondary | ICD-10-CM | POA: Diagnosis not present

## 2012-03-05 DIAGNOSIS — Z7982 Long term (current) use of aspirin: Secondary | ICD-10-CM | POA: Insufficient documentation

## 2012-03-05 DIAGNOSIS — R05 Cough: Secondary | ICD-10-CM | POA: Insufficient documentation

## 2012-03-05 DIAGNOSIS — R42 Dizziness and giddiness: Secondary | ICD-10-CM | POA: Diagnosis not present

## 2012-03-05 DIAGNOSIS — R51 Headache: Secondary | ICD-10-CM

## 2012-03-05 DIAGNOSIS — R059 Cough, unspecified: Secondary | ICD-10-CM | POA: Diagnosis not present

## 2012-03-05 DIAGNOSIS — F3289 Other specified depressive episodes: Secondary | ICD-10-CM | POA: Insufficient documentation

## 2012-03-05 HISTORY — DX: Other pulmonary embolism without acute cor pulmonale: I26.99

## 2012-03-05 LAB — CBC WITH DIFFERENTIAL/PLATELET
Basophils Relative: 2 % — ABNORMAL HIGH (ref 0–1)
Eosinophils Relative: 14 % — ABNORMAL HIGH (ref 0–5)
Hemoglobin: 13.1 g/dL (ref 12.0–15.0)
Lymphs Abs: 2.5 10*3/uL (ref 0.7–4.0)
MCH: 30.3 pg (ref 26.0–34.0)
MCHC: 34.9 g/dL (ref 30.0–36.0)
MCV: 86.6 fL (ref 78.0–100.0)
Monocytes Absolute: 0.6 10*3/uL (ref 0.1–1.0)
Neutro Abs: 7.5 10*3/uL (ref 1.7–7.7)
RBC: 4.33 MIL/uL (ref 3.87–5.11)

## 2012-03-05 LAB — BASIC METABOLIC PANEL
BUN: 10 mg/dL (ref 6–23)
CO2: 24 mEq/L (ref 19–32)
Calcium: 9.5 mg/dL (ref 8.4–10.5)
Creatinine, Ser: 0.76 mg/dL (ref 0.50–1.10)
Glucose, Bld: 177 mg/dL — ABNORMAL HIGH (ref 70–99)

## 2012-03-05 LAB — PROTIME-INR: INR: 2.19 — ABNORMAL HIGH (ref 0.00–1.49)

## 2012-03-05 MED ORDER — TOPIRAMATE 25 MG PO TABS: 25.0000 mg | ORAL_TABLET | Freq: Two times a day (BID) | ORAL | Status: DC

## 2012-03-05 MED ORDER — MAGNESIUM SULFATE 40 MG/ML IJ SOLN
2.0000 g | Freq: Once | INTRAMUSCULAR | Status: AC
  Administered 2012-03-05: 2 g via INTRAVENOUS
  Filled 2012-03-05: qty 50

## 2012-03-05 MED ORDER — DIPHENHYDRAMINE HCL 50 MG/ML IJ SOLN
12.5000 mg | Freq: Once | INTRAMUSCULAR | Status: AC
  Administered 2012-03-05: 12.5 mg via INTRAVENOUS
  Filled 2012-03-05: qty 1

## 2012-03-05 MED ORDER — SODIUM CHLORIDE 0.9 % IV SOLN
INTRAVENOUS | Status: DC
  Administered 2012-03-05 (×2): via INTRAVENOUS

## 2012-03-05 MED ORDER — VALPROATE SODIUM 500 MG/5ML IV SOLN
1000.0000 mg | Freq: Once | INTRAVENOUS | Status: AC
  Administered 2012-03-05: 1000 mg via INTRAVENOUS
  Filled 2012-03-05: qty 10

## 2012-03-05 MED ORDER — SODIUM CHLORIDE 0.9 % IV SOLN
1000.0000 mL | INTRAVENOUS | Status: DC
  Administered 2012-03-05: 1000 mL via INTRAVENOUS

## 2012-03-05 MED ORDER — DEXAMETHASONE SODIUM PHOSPHATE 10 MG/ML IJ SOLN
10.0000 mg | Freq: Once | INTRAMUSCULAR | Status: AC
  Administered 2012-03-05: 10 mg via INTRAVENOUS
  Filled 2012-03-05: qty 1

## 2012-03-05 MED ORDER — MORPHINE SULFATE 4 MG/ML IJ SOLN
4.0000 mg | INTRAMUSCULAR | Status: DC | PRN
  Administered 2012-03-05: 4 mg via INTRAVENOUS
  Filled 2012-03-05: qty 1

## 2012-03-05 MED ORDER — METOCLOPRAMIDE HCL 5 MG/ML IJ SOLN
10.0000 mg | Freq: Once | INTRAMUSCULAR | Status: AC
  Administered 2012-03-05: 10 mg via INTRAVENOUS
  Filled 2012-03-05: qty 2

## 2012-03-05 MED ORDER — SODIUM CHLORIDE 0.9 % IV SOLN: 1000.0000 mL | Freq: Once | INTRAVENOUS | Status: DC

## 2012-03-05 MED ORDER — PROCHLORPERAZINE MALEATE 10 MG PO TABS: 10.0000 mg | ORAL_TABLET | Freq: Two times a day (BID) | ORAL | Status: DC | PRN

## 2012-03-05 NOTE — ED Notes (Signed)
Pt was discharged from [facility] yesterday after being admitted for bilateral PE. Pt reports since she went home she continues to have severe headache, and states "i just feel awful." A&Ox4, resp e/u. No cp or sob

## 2012-03-05 NOTE — ED Notes (Signed)
Patient appears to be asleep , arouses easily Sats dropped to 89% recovered when aroused.

## 2012-03-05 NOTE — ED Notes (Signed)
Assisted pt to restroom  

## 2012-03-05 NOTE — Progress Notes (Signed)
Reason for Consult: Headache Referring Physician: Dr. Lynelle Doctor  CC: Intermittent headache times one week  HPI: Taylor [name] is an 67 y.o. female who was discharged from [facility] yesterday following admission for bilateral pulmonary emboli. The patient presented to the emergency department at [facility] today with a headache which she rated as an 8 on a 1-10 scale.  She states her headaches started just prior to her pulmonary emboli diagnosis. They've been intermittent since that time. The patient denies any associated symptoms other than nausea, vomiting, and decreased appetite. She does endorse some photophobia. She specifically denies any types of visual changes, focal weakness. Since arriving in the emergency department today the patient has received 4 mg IV morphine, Benadryl, and Reglan. These medications brought the pain from an 8 down to a 1. She states she is not aware of any precipitating symptoms. When the headaches start she tries to remain still and has taken Tylenol without significant relief.   The patient was discharged from the hospital yesterday with a prescription for Vicodin; however, she has not used this medication as of yet. She does note that her headaches are worse if she remains active. She describes the headaches as being dull, starting in her forehead and eventually radiating around to the back of her head. They are of a throbbing nature. She has not noticed any significant change in the severity while bending over.   Of note, she had severe headaches when she was younger, but they subsided. She had an LP with the previous hospitalization. An OP was not recorded, btu there was no pleocytosis or xanthochromia.    Past Medical History  Diagnosis Date  . Coronary artery disease   . Hypercholesteremia   . Hypertension   . Diabetes mellitus   . Reflux esophagitis   . Depression   . GERD (gastroesophageal reflux disease)   . Acute MI   . Cancer    breast  . Pulmonary embolism     Past Surgical History  Procedure Laterality Date  . Coronary artery bypass graft      x4  . Ankle surgery      left  . Cholecystectomy    . Cataract extraction, bilateral    . Right renal detachment surgery    . Eye surgery      right  . Mastectomy      Family History  Problem Relation Age of Onset  . Coronary artery disease Mother 63    status post CABG  The patient states her mother died recently at age 51 from pulmonary fibrosis.  [name] Heart attack Father 48    deceased secondary to fatal  first myocardial infarction  . Coronary artery disease Sister 5    alive had bypass surgery  . Cancer Sister   . Deep vein thrombosis Cousin     Neice had after knee surgery    Social History:  reports that she quit smoking about 8 years ago. She does not have any smokeless tobacco history on file. She reports that she does not drink alcohol or use illicit drugs. She lives in [place] with her husband. They have one adult child.  Allergies  Allergen Reactions  . Niacin Other (See Comments)    burning    Medications:  Current Facility-Administered Medications  Medication Dose Route Frequency Provider Last Rate Last Dose  . 0.9 %  sodium chloride infusion   Intravenous Continuous Celene Kras, MD      .  0.9 %  sodium chloride infusion  1,000 mL Intravenous Once Celene Kras, MD       Followed by  . 0.9 %  sodium chloride infusion  1,000 mL Intravenous Continuous Celene Kras, MD 125 mL/hr at 03/05/12 1141 1,000 mL at 03/05/12 1141  . morphine 4 MG/ML injection 4 mg  4 mg Intravenous Q30 min PRN Celene Kras, MD   4 mg at 03/05/12 1146   Current Outpatient Prescriptions  Medication Sig Dispense Refill  . acetaminophen (TYLENOL) 500 MG tablet Take 1,000 mg by mouth every 6 (six) hours as needed for pain.      [name] aspirin 81 MG tablet Take 81 mg by mouth daily.        . carvedilol (COREG) 6.25 MG tablet Take 6.25 mg by mouth 2 (two) times daily with a  meal.        . ezetimibe-simvastatin (VYTORIN) 10-40 MG per tablet Take 1 tablet by mouth at bedtime.        [name] glimepiride (AMARYL) 2 MG tablet Take 2 mg by mouth at bedtime.       . metFORMIN (GLUCOPHAGE-XR) 500 MG 24 hr tablet Take 1,000 mg by mouth 2 (two) times daily.        . sertraline (ZOLOFT) 50 MG tablet Take 50 mg by mouth daily.        [name] warfarin (COUMADIN) 5 MG tablet Take 2.5 mg by mouth daily. Take 0.5 tab (2.5 mg) on 2.21.2014, then 1 tab daily.      [name] ALPRAZolam (XANAX) 0.25 MG tablet Take 0.25 mg by mouth 3 (three) times daily as needed. For anxiety      . HYDROcodone-acetaminophen (NORCO/VICODIN) 5-325 MG per tablet Take 1-2 tablets by mouth every 4 (four) hours as needed.  15 tablet  0  . ondansetron (ZOFRAN ODT) 8 MG disintegrating tablet Take 1 tablet (8 mg total) by mouth every 8 (eight) hours as needed for nausea.  20 tablet  0     ROS: History obtained from the patient  General ROS: negative for - chills, fatigue, fever, night sweats, weight gain or weight loss. She does report decreased appetite over the past week. Psychological ROS: negative for - behavioral disorder, hallucinations, memory difficulties, mood swings or suicidal ideation Ophthalmic ROS: negative for - blurry vision, double vision, eye pain or loss of vision ENT ROS: negative for - epistaxis, nasal discharge, oral lesions, sore throat, tinnitus or vertigo Allergy and Immunology ROS: negative for - hives or itchy/watery eyes Hematological and Lymphatic ROS: negative for - bleeding problems, bruising or swollen lymph nodes Endocrine ROS: negative for - galactorrhea, hair pattern changes, polydipsia/polyuria or temperature intolerance Respiratory ROS: negative for - cough, hemoptysis, shortness of breath or wheezing Cardiovascular ROS: negative for - The patient had recent chest pain shortness of breath associated with her pulmonary emboli. She is not having the symptoms today. Gastrointestinal ROS:  negative for - abdominal pain, diarrhea, hematemesis, The patient had nausea and vomiting last week prior to her admission for pulmonary emboli. Genito-Urinary ROS: negative for - dysuria, hematuria, incontinence or urinary frequency/urgency Musculoskeletal ROS: negative for - joint swelling or muscular weakness Neurological ROS: as noted in HPI Dermatological ROS: negative for rash and skin lesion changes  Physical Examination: Blood pressure 176/78, pulse 45, temperature 98.1 F (36.7 C), temperature source Oral, resp. rate 16, SpO2 95.00%.  General - This is a pleasant 67 year old female in no acute distress. Heart - Regular rate and rhythm -  no murmer Lungs - Clear to auscultation Abdomen - Soft - non tender Extremities - Distal pulses intact - no edema Skin - Warm and dry  Neurologic Examination  Mental Status: Alert, oriented, thought content appropriate.  Speech fluent without evidence of aphasia.  Able to follow 3 step commands without difficulty. Cranial Nerves: II: ; Visual fields grossly normal, pupils equal, round, reactive to light and accommodation. Discs are difficult to visualize due to narcotics.  III,IV, VI: ptosis not present, extra-ocular motions intact bilaterally V,VII: smile symmetric, facial light touch sensation normal bilaterally VIII: hearing normal bilaterally IX,X: gag reflex present XI: bilateral shoulder shrug XII: midline tongue extension Motor: Right : Upper extremity   5/5    Left:     Upper extremity   5/5  Lower extremity   5/5     Lower extremity   5/5 Tone and bulk:normal tone throughout; no atrophy noted Sensory: Pinprick and light touch intact throughout, bilaterally Deep Tendon Reflexes: 2+ and symmetric throughout Plantars: Right: downgoing   Left: downgoing Cerebellar: normal finger-to-nose, normal rapid alternating movements and normal heel-to-shin test Gait: normal gait and station CV: pulses palpable throughout   Laboratory  Studies:   Basic Metabolic Panel:  Recent Labs Lab 02/29/12 1334 03/01/12 0300 03/05/12 1117  NA 139 141 137  K 3.8 3.4* 3.5  CL 98 104 101  CO2 25 26 24   GLUCOSE 200* 191* 177*  BUN 21 20 10   CREATININE 1.28* 0.98 0.76  CALCIUM 9.1 8.1* 9.5    Liver Function Tests:  Recent Labs Lab 02/29/12 1334  AST 92*  ALT 91*  ALKPHOS 71  BILITOT 0.5  PROT 7.9  ALBUMIN 3.7   No results found for this basename: LIPASE, AMYLASE,  in the last 168 hours No results found for this basename: AMMONIA,  in the last 168 hours  CBC:  Recent Labs Lab 02/29/12 1334 03/01/12 0300 03/02/12 0545 03/03/12 0435 03/04/12 0505 03/05/12 1117  WBC 26.4* 20.6* 13.9* 13.8* 11.6* 12.7*  NEUTROABS 22.5*  --   --   --   --  7.5  HGB 13.0 11.3* 11.8* 11.1* 11.3* 13.1  HCT 36.8 33.0* 35.1* 32.5* 32.4* 37.5  MCV 85.6 86.8 88.4 87.4 85.5 86.6  PLT 208 185 198 193 232 282    Cardiac Enzymes:  Recent Labs Lab 02/29/12 1825 02/29/12 2105 03/01/12 0300 03/01/12 1015  TROPONINI 1.68* 1.58* 0.98* 0.62*    BNP: No components found with this basename: POCBNP,   CBG:  Recent Labs Lab 03/03/12 1149 03/03/12 1631 03/03/12 2205 03/04/12 0613 03/04/12 1124  GLUCAP 214* 143* 153* 151* 175*    Microbiology: Results for orders placed during the hospital encounter of 02/29/12  GRAM STAIN     Status: None   Collection Time    02/29/12  4:55 PM      Result Value Range Status   Specimen Description CSF   Final   Special Requests NONE   Final   Gram Stain     Final   Value: CYTOSPIN PREP     WBC PRESENT, PREDOMINANTLY MONONUCLEAR     NO ORGANISMS SEEN   Report Status 02/29/2012 FINAL   Final  CSF CULTURE     Status: None   Collection Time    02/29/12  4:56 PM      Result Value Range Status   Specimen Description CSF   Final   Special Requests NONE   Final   Gram Stain     Final  Value: RARE WBC PRESENT, PREDOMINANTLY MONONUCLEAR     NO ORGANISMS SEEN     CYTOSPIN Performed at  [facility]   Culture NO GROWTH 3 DAYS   Final   Report Status 03/04/2012 FINAL   Final  MRSA PCR SCREENING     Status: None   Collection Time    03/01/12 12:14 AM      Result Value Range Status   MRSA by PCR NEGATIVE  NEGATIVE Final   Comment:            The GeneXpert MRSA Assay (FDA     approved for NASAL specimens     only), is one component of a     comprehensive MRSA colonization     surveillance program. It is not     intended to diagnose MRSA     infection nor to guide or     monitor treatment for     MRSA infections.    Coagulation Studies:  Recent Labs  03/03/12 0435 03/04/12 0505 03/05/12 1117  LABPROT 25.4* 31.9* 23.4*  INR 2.44* 3.33* 2.19*    Urinalysis:  Recent Labs Lab 02/29/12 1426  COLORURINE YELLOW  LABSPEC 1.022  PHURINE 5.0  GLUCOSEU NEGATIVE  HGBUR SMALL*  BILIRUBINUR SMALL*  KETONESUR NEGATIVE  PROTEINUR 30*  UROBILINOGEN 0.2  NITRITE NEGATIVE  LEUKOCYTESUR NEGATIVE    Lipid Panel:  No results found for this basename: chol, trig, hdl, cholhdl, vldl, ldlcalc    HgbA1C:  Lab Results  Component Value Date   HGBA1C 7.6* 03/01/2012    Urine Drug Screen:   No results found for this basename: labopia, cocainscrnur, labbenz, amphetmu, thcu, labbarb    Alcohol Level: No results found for this basename: ETH,  in the last 168 hours   Imaging:  Ct Head Wo Contrast 03/05/12 IMPRESSION: No acute intracranial abnormality.  No significant change.       MRV - no thrombosis.   Plan per Dr. Uvaldo Rising Rinehuls PA-C [facility] Pager [phone] 03/05/2012, 3:12 PM  I have reviewed the above note and agree with the plan.    Assessment/Plan:  67 yo F with remote history of migraines now with severe throbbing headache with nausea, photophobia. She has been evaluated with CT head, MRV and LP all of which have been unremarkable. 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 I discussed that she should return to care for any worsening of her vision.    1) Magnesium 2gm, depakote 1gm, decadron 10mg .   2) Would discharge on topamax 25mg  BID for one week, then 50mg  BID.   3) For severe symptoms would use PO compazine 10mg +benadryl 25mg .   4) Could use hydrocodone sparingly  Ritta Slot, MD [facility] [phone]  If 7pm- 7am, please page neurology on call at [phone].

## 2012-03-05 NOTE — ED Notes (Addendum)
Back from CT

## 2012-03-05 NOTE — ED Provider Notes (Signed)
History     CSN: [id number]  Arrival date & time 03/05/12  1024   First MD Initiated Contact with Patient 03/05/12 1111      Chief Complaint  Patient presents with  . Headache    HPI Comments: Pt had been in the hospital recently for a complicated course of headache, PE, NSTEMI.  She was released from the hospital yesterday.  She continued to have the headache while she was in the hospital but it was not this bad.  She had it again this morning but feels worse when she moves around.  Patient is a 67 y.o. female presenting with headaches. The history is provided by the patient.  Headache Pain location:  Generalized Quality:  Unable to specify Radiates to:  Does not radiate Severity currently:  10/10 Severity at highest:  10/10 Onset quality:  Gradual Timing:  Constant Chronicity:  Recurrent Relieved by:  Nothing (pt did not get her vicodin filled) Worsened by:  Light and activity Ineffective treatments:  Acetaminophen Associated symptoms: cough, photophobia and vomiting   Associated symptoms: no fever and no focal weakness     Past Medical History  Diagnosis Date  . Coronary artery disease   . Hypercholesteremia   . Hypertension   . Diabetes mellitus   . Reflux esophagitis   . Depression   . GERD (gastroesophageal reflux disease)   . Acute MI   . Cancer     breast  . Pulmonary embolism     Past Surgical History  Procedure Laterality Date  . Coronary artery bypass graft      x4  . Ankle surgery      left  . Cholecystectomy    . Cataract extraction, bilateral    . Right renal detachment surgery    . Eye surgery      right  . Mastectomy      Family History  Problem Relation Age of Onset  . Coronary artery disease Mother 2    status post CABG   . Heart attack Father 86    deceased secondary to fatal  first myocardial infarction  . Coronary artery disease Sister 107    alive had bypass surgery  . Cancer Sister   . Deep vein thrombosis Cousin     Neice  had after knee surgery    History  Substance Use Topics  . Smoking status: Former Smoker    Quit date: 01/13/2004  . Smokeless tobacco: Not on file  . Alcohol Use: No    OB History   Grav Para Term Preterm Abortions TAB SAB Ect Mult Living                  Review of Systems  Constitutional: Negative for fever.  Eyes: Positive for photophobia.  Respiratory: Positive for cough.   Gastrointestinal: Positive for vomiting.  Neurological: Positive for headaches. Negative for focal weakness.  All other systems reviewed and are negative.    Allergies  Niacin  Home Medications   Current Outpatient Rx  Name  Route  Sig  Dispense  Refill  . ALPRAZolam (XANAX) 0.25 MG tablet   Oral   Take 0.25 mg by mouth 3 (three) times daily as needed. For anxiety         . aspirin 81 MG tablet   Oral   Take 81 mg by mouth daily.           . carvedilol (COREG) 6.25 MG tablet   Oral   Take 6.25  mg by mouth 2 (two) times daily with a meal.           . ezetimibe-simvastatin (VYTORIN) 10-40 MG per tablet   Oral   Take 1 tablet by mouth at bedtime.           [name] glimepiride (AMARYL) 2 MG tablet   Oral   Take 2 mg by mouth at bedtime.          [name] HYDROcodone-acetaminophen (NORCO/VICODIN) 5-325 MG per tablet   Oral   Take 1-2 tablets by mouth every 4 (four) hours as needed.   15 tablet   0   . metFORMIN (GLUCOPHAGE-XR) 500 MG 24 hr tablet   Oral   Take 1,000 mg by mouth 2 (two) times daily.           . ondansetron (ZOFRAN ODT) 8 MG disintegrating tablet   Oral   Take 1 tablet (8 mg total) by mouth every 8 (eight) hours as needed for nausea.   20 tablet   0   . sertraline (ZOLOFT) 50 MG tablet   Oral   Take 50 mg by mouth daily.           [name] warfarin (COUMADIN) 5 MG tablet      Take 0.5 tab (2.5 mg) on 2.21.2014, then 1 tab daily.   30 tablet   0     BP 180/92  Pulse 58  Temp(Src) 98.1 F (36.7 C) (Oral)  Resp 16  SpO2 95%  Physical Exam  Nursing note  and vitals reviewed. Constitutional: She appears well-developed and well-nourished. No distress.  HENT:  Head: Normocephalic and atraumatic.  Right Ear: External ear normal.  Left Ear: External ear normal.  Eyes: Conjunctivae are normal. Right eye exhibits no discharge. Left eye exhibits no discharge. No scleral icterus.  Neck: Neck supple. No tracheal deviation present.  Cardiovascular: Normal rate, regular rhythm and intact distal pulses.   Pulmonary/Chest: Effort normal and breath sounds normal. No stridor. No respiratory distress. She has no wheezes. She has no rales.  Abdominal: Soft. Bowel sounds are normal. She exhibits no distension. There is no tenderness. There is no rebound and no guarding.  Musculoskeletal: She exhibits no edema and no tenderness.  Neurological: She is alert. She has normal strength. No sensory deficit. Cranial nerve deficit:  no gross defecits noted. She exhibits normal muscle tone. She displays no seizure activity. Coordination normal.  Skin: Skin is warm and dry. No rash noted.  Psychiatric: She has a normal mood and affect.    ED Course  Procedures (including critical care time)  Labs Reviewed  CBC WITH DIFFERENTIAL - Abnormal; Notable for the following:    WBC 12.7 (*)    Eosinophils Relative 14 (*)    Basophils Relative 2 (*)    Eosinophils Absolute 1.8 (*)    Basophils Absolute 0.3 (*)    All other components within normal limits  BASIC METABOLIC PANEL - Abnormal; Notable for the following:    Glucose, Bld 177 (*)    GFR calc non Af Amer 87 (*)    All other components within normal limits  PROTIME-INR - Abnormal; Notable for the following:    Prothrombin Time 23.4 (*)    INR 2.19 (*)    All other components within normal limits   Ct Head Wo Contrast  03/05/2012  *RADIOLOGY REPORT*  Clinical Data: Severe headache  CT HEAD WITHOUT CONTRAST  Technique:  Contiguous axial images were obtained from the base of the  skull through the vertex without  contrast.  Comparison: 02/29/2012  Findings: No skull fracture is noted.  Paranasal sinuses and mastoid air cells are unremarkable.  No acute infarction.  No mass lesion is noted on this unenhanced scan.  Ventricular size is stable from prior exam.  No intracranial hemorrhage, mass effect or midline shift.  IMPRESSION: No acute intracranial abnormality.  No significant change.   Original Report Authenticated By: Natasha Mead, M.D.    Mr Mrv Head Wo Cm  03/05/2012  *RADIOLOGY REPORT*  Clinical Data:  Headache.  Dizziness.  MRV HEAD WITHOUT CONTRAST  Technique:  Angiographic images of the intracranial venous structures were obtained using MRV technique without intravenous contrast.  Comparison:  Head CT same day  Findings:  The venogram is normal.  The superior sagittal sinus is normal.  Internal cerebral veins and straight sinus are normal. Both transverse sinuses are patent with the right being dominant. Both sigmoid sinuses are patent.  Both jugular veins show flow. Insignificant arachnoid granulation in the transverse sinus on the left.  IMPRESSION: Normal intracranial MR venography   Original Report Authenticated By: Paulina Fusi, M.D.      MDM  I reviewed the patient's recent records. She has had a complex recent course with a pulmonary embolism requiring hospitalization.  Patient did have previous head CT that was unremarkable. She also had a lumbar puncture that did not show any evidence of infection. There is also no significant amounts of blood to suggest subarachnoid hemorrhage.  The patient's headache has persisted. She does feel better now after treatment in the emergency department. She has had a pretty extensive evaluation however the family is questioning whether an MRI is indicated. Considering her hypercoagulability, I will consult neurology to discuss whether an MRI/MRA would be indicated.   Pt was kindly seen by Dr Anise Salvo, [facility].  MRV did not show any acute findings.   Pt was given decadron and valproic acid for probable migraine headache.  She has responded well.      Celene Kras, MD 03/05/12 [phone]

## 2012-03-05 NOTE — ED Notes (Signed)
Patient transported to CT 

## 2012-03-07 ENCOUNTER — Ambulatory Visit (INDEPENDENT_AMBULATORY_CARE_PROVIDER_SITE_OTHER): Payer: Medicare Other | Admitting: *Deleted

## 2012-03-07 DIAGNOSIS — I2699 Other pulmonary embolism without acute cor pulmonale: Secondary | ICD-10-CM

## 2012-03-07 DIAGNOSIS — I872 Venous insufficiency (chronic) (peripheral): Secondary | ICD-10-CM

## 2012-03-07 LAB — POCT INR: INR: 4.1

## 2012-03-07 NOTE — Patient Instructions (Addendum)

## 2012-03-09 DIAGNOSIS — R51 Headache: Secondary | ICD-10-CM | POA: Diagnosis not present

## 2012-03-09 DIAGNOSIS — D72829 Elevated white blood cell count, unspecified: Secondary | ICD-10-CM | POA: Diagnosis not present

## 2012-03-10 DIAGNOSIS — IMO0001 Reserved for inherently not codable concepts without codable children: Secondary | ICD-10-CM | POA: Diagnosis not present

## 2012-03-10 DIAGNOSIS — I798 Other disorders of arteries, arterioles and capillaries in diseases classified elsewhere: Secondary | ICD-10-CM | POA: Diagnosis not present

## 2012-03-10 DIAGNOSIS — E1159 Type 2 diabetes mellitus with other circulatory complications: Secondary | ICD-10-CM | POA: Diagnosis not present

## 2012-03-11 DIAGNOSIS — I509 Heart failure, unspecified: Secondary | ICD-10-CM | POA: Diagnosis not present

## 2012-03-11 DIAGNOSIS — IMO0001 Reserved for inherently not codable concepts without codable children: Secondary | ICD-10-CM | POA: Diagnosis not present

## 2012-03-11 DIAGNOSIS — I5032 Chronic diastolic (congestive) heart failure: Secondary | ICD-10-CM | POA: Diagnosis not present

## 2012-03-11 DIAGNOSIS — I798 Other disorders of arteries, arterioles and capillaries in diseases classified elsewhere: Secondary | ICD-10-CM | POA: Diagnosis not present

## 2012-03-11 DIAGNOSIS — E1159 Type 2 diabetes mellitus with other circulatory complications: Secondary | ICD-10-CM | POA: Diagnosis not present

## 2012-03-14 ENCOUNTER — Ambulatory Visit (INDEPENDENT_AMBULATORY_CARE_PROVIDER_SITE_OTHER): Payer: Medicare Other | Admitting: *Deleted

## 2012-03-14 DIAGNOSIS — I872 Venous insufficiency (chronic) (peripheral): Secondary | ICD-10-CM | POA: Diagnosis not present

## 2012-03-14 LAB — POCT INR: INR: 3.4

## 2012-03-14 NOTE — Patient Instructions (Addendum)
Blood glucose finger stick today 118  Eat some leafy green vegetables 2 times/week

## 2012-03-15 DIAGNOSIS — I5032 Chronic diastolic (congestive) heart failure: Secondary | ICD-10-CM | POA: Diagnosis not present

## 2012-03-15 DIAGNOSIS — I798 Other disorders of arteries, arterioles and capillaries in diseases classified elsewhere: Secondary | ICD-10-CM | POA: Diagnosis not present

## 2012-03-15 DIAGNOSIS — E1159 Type 2 diabetes mellitus with other circulatory complications: Secondary | ICD-10-CM | POA: Diagnosis not present

## 2012-03-15 DIAGNOSIS — IMO0001 Reserved for inherently not codable concepts without codable children: Secondary | ICD-10-CM | POA: Diagnosis not present

## 2012-03-15 DIAGNOSIS — I2699 Other pulmonary embolism without acute cor pulmonale: Secondary | ICD-10-CM | POA: Diagnosis not present

## 2012-03-15 DIAGNOSIS — I509 Heart failure, unspecified: Secondary | ICD-10-CM | POA: Diagnosis not present

## 2012-03-21 ENCOUNTER — Ambulatory Visit (INDEPENDENT_AMBULATORY_CARE_PROVIDER_SITE_OTHER): Payer: 59 | Admitting: Pharmacist

## 2012-03-21 DIAGNOSIS — I2699 Other pulmonary embolism without acute cor pulmonale: Secondary | ICD-10-CM | POA: Diagnosis not present

## 2012-03-21 DIAGNOSIS — I872 Venous insufficiency (chronic) (peripheral): Secondary | ICD-10-CM | POA: Diagnosis not present

## 2012-03-28 ENCOUNTER — Ambulatory Visit (INDEPENDENT_AMBULATORY_CARE_PROVIDER_SITE_OTHER): Payer: Medicare Other | Admitting: *Deleted

## 2012-03-28 DIAGNOSIS — I872 Venous insufficiency (chronic) (peripheral): Secondary | ICD-10-CM

## 2012-03-28 DIAGNOSIS — I2699 Other pulmonary embolism without acute cor pulmonale: Secondary | ICD-10-CM | POA: Diagnosis not present

## 2012-03-28 LAB — POCT INR: INR: 3.6

## 2012-03-31 DIAGNOSIS — I798 Other disorders of arteries, arterioles and capillaries in diseases classified elsewhere: Secondary | ICD-10-CM | POA: Diagnosis not present

## 2012-03-31 DIAGNOSIS — I509 Heart failure, unspecified: Secondary | ICD-10-CM | POA: Diagnosis not present

## 2012-03-31 DIAGNOSIS — I5032 Chronic diastolic (congestive) heart failure: Secondary | ICD-10-CM | POA: Diagnosis not present

## 2012-03-31 DIAGNOSIS — IMO0001 Reserved for inherently not codable concepts without codable children: Secondary | ICD-10-CM | POA: Diagnosis not present

## 2012-03-31 DIAGNOSIS — E1159 Type 2 diabetes mellitus with other circulatory complications: Secondary | ICD-10-CM | POA: Diagnosis not present

## 2012-03-31 DIAGNOSIS — I2699 Other pulmonary embolism without acute cor pulmonale: Secondary | ICD-10-CM | POA: Diagnosis not present

## 2012-04-07 ENCOUNTER — Ambulatory Visit (INDEPENDENT_AMBULATORY_CARE_PROVIDER_SITE_OTHER): Payer: Medicare Other

## 2012-04-07 DIAGNOSIS — I2699 Other pulmonary embolism without acute cor pulmonale: Secondary | ICD-10-CM

## 2012-04-07 DIAGNOSIS — I872 Venous insufficiency (chronic) (peripheral): Secondary | ICD-10-CM

## 2012-04-07 LAB — POCT INR: INR: 1.8

## 2012-04-20 ENCOUNTER — Other Ambulatory Visit: Payer: Self-pay | Admitting: Emergency Medicine

## 2012-04-20 DIAGNOSIS — Z853 Personal history of malignant neoplasm of breast: Secondary | ICD-10-CM

## 2012-04-21 ENCOUNTER — Ambulatory Visit (INDEPENDENT_AMBULATORY_CARE_PROVIDER_SITE_OTHER): Payer: 59 | Admitting: Pharmacist

## 2012-04-21 ENCOUNTER — Telehealth: Payer: Self-pay | Admitting: Oncology

## 2012-04-21 ENCOUNTER — Other Ambulatory Visit (HOSPITAL_BASED_OUTPATIENT_CLINIC_OR_DEPARTMENT_OTHER): Payer: Medicare Other | Admitting: Lab

## 2012-04-21 ENCOUNTER — Encounter: Payer: Self-pay | Admitting: Oncology

## 2012-04-21 ENCOUNTER — Ambulatory Visit (HOSPITAL_BASED_OUTPATIENT_CLINIC_OR_DEPARTMENT_OTHER): Payer: 59 | Admitting: Oncology

## 2012-04-21 VITALS — BP 168/68 | HR 67 | Temp 97.8°F | Resp 20 | Ht 67.0 in | Wt 179.8 lb

## 2012-04-21 DIAGNOSIS — I2699 Other pulmonary embolism without acute cor pulmonale: Secondary | ICD-10-CM

## 2012-04-21 DIAGNOSIS — Z853 Personal history of malignant neoplasm of breast: Secondary | ICD-10-CM | POA: Diagnosis not present

## 2012-04-21 DIAGNOSIS — I872 Venous insufficiency (chronic) (peripheral): Secondary | ICD-10-CM

## 2012-04-21 LAB — CBC WITH DIFFERENTIAL/PLATELET
Basophils Absolute: 0 10*3/uL (ref 0.0–0.1)
Eosinophils Absolute: 0.2 10*3/uL (ref 0.0–0.5)
HGB: 13.1 g/dL (ref 11.6–15.9)
LYMPH%: 33.2 % (ref 14.0–49.7)
MCV: 88.5 fL (ref 79.5–101.0)
MONO%: 6.4 % (ref 0.0–14.0)
NEUT#: 3.2 10*3/uL (ref 1.5–6.5)
Platelets: 257 10*3/uL (ref 145–400)
RBC: 4.43 10*6/uL (ref 3.70–5.45)
WBC: 5.6 10*3/uL (ref 3.9–10.3)

## 2012-04-21 LAB — COMPREHENSIVE METABOLIC PANEL (CC13)
Alkaline Phosphatase: 50 U/L (ref 40–150)
BUN: 11.5 mg/dL (ref 7.0–26.0)
Creatinine: 1 mg/dL (ref 0.6–1.1)
Glucose: 121 mg/dl — ABNORMAL HIGH (ref 70–99)
Total Bilirubin: 0.59 mg/dL (ref 0.20–1.20)

## 2012-04-21 NOTE — Progress Notes (Signed)
OFFICE PROGRESS NOTE  CC  Taylor Floro, MD [address] [place] [address] Dr. Ovidio Kin Dr. 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 Patient is status post left breast lumpectomy on 03/21/2009.  PRIOR THERAPY:  #1 patient presented with a high-grade DCIS she underwent a left breast lumpectomy that showed a DCIS that was ER positive PR positive.  #2 patient declined any chemopreventive therapy for the contralateral breast.  #3 patient with recent diagnosis of DVT and pulmonary embolism hospitalized at [facility]. She is now on Coumadin. Her INRs are being monitored by Dr. Tenny Craw.  CURRENT THERAPY:Observation  INTERVAL HISTORY: Taylor [name] 67 y.o. female returns for Followup visit today. I have not seen her for one year. Her interim history is complicated by her being hospitalized for what sounds like a pulmonary embolisms. She had a full workup performed which did not show a source for her cause for hypercoagulability. She is currently on Coumadin. Of note patient has never been on tamoxifen or any other medications that could possibly cause hypercoagulability. From an oncology perspective. Today she feels well she has no shortness of breath no chest pains or palpitations no swelling in her lower extremities. She does have INRs monitored by her primary care physician's office. And she will continue to do this.  MEDICAL HISTORY: Past Medical History  Diagnosis Date  . Coronary artery disease   . Hypercholesteremia   . Hypertension   . Diabetes mellitus   . Reflux esophagitis   . Depression   . GERD (gastroesophageal reflux disease)   . Acute MI   . Cancer     breast  . Pulmonary embolism     ALLERGIES:  is allergic to niacin.  MEDICATIONS:  Current Outpatient Prescriptions  Medication Sig Dispense Refill  . acetaminophen (TYLENOL) 500 MG tablet Take 1,000 mg by mouth every 6 (six)  hours as needed for pain.      [name] aspirin 81 MG tablet Take 81 mg by mouth daily.        [name] glimepiride (AMARYL) 2 MG tablet Take 2 mg by mouth at bedtime.       . metFORMIN (GLUCOPHAGE-XR) 500 MG 24 hr tablet Take 1,000 mg by mouth 2 (two) times daily.        . sertraline (ZOLOFT) 50 MG tablet Take 50 mg by mouth daily.        . simvastatin (ZOCOR) 40 MG tablet Take 40 mg by mouth every evening.      . warfarin (COUMADIN) 5 MG tablet Take 2.5 mg by mouth daily. Take 0.5 tab (2.5 mg) on 2.21.2014, then 1 tab daily.      [name] ALPRAZolam (XANAX) 0.25 MG tablet Take 0.25 mg by mouth 3 (three) times daily as needed. For anxiety      . HYDROcodone-acetaminophen (NORCO/VICODIN) 5-325 MG per tablet Take 1-2 tablets by mouth every 4 (four) hours as needed.  15 tablet  0  . ondansetron (ZOFRAN ODT) 8 MG disintegrating tablet Take 1 tablet (8 mg total) by mouth every 8 (eight) hours as needed for nausea.  20 tablet  0  . prochlorperazine (COMPAZINE) 10 MG tablet Take 1 tablet (10 mg total) by mouth 2 (two) times daily as needed. Headache, nausea  10 tablet  0  . topiramate (TOPAMAX) 25 MG tablet Take 1 tablet (25 mg total) by mouth 2 (two) times daily.  60 tablet  0  No current facility-administered medications for this visit.    SURGICAL HISTORY:  Past Surgical History  Procedure Laterality Date  . Coronary artery bypass graft      x4  . Ankle surgery      left  . Cholecystectomy    . Cataract extraction, bilateral    . Right renal detachment surgery    . Eye surgery      right  . Mastectomy      REVIEW OF SYSTEMS:  Pertinent items are noted in HPI.   PHYSICAL EXAMINATION: General appearance: alert, cooperative and appears stated age Lymph nodes: Cervical, supraclavicular, and axillary nodes normal. Resp: clear to auscultation bilaterally and normal percussion bilaterally Back: symmetric, no curvature. ROM normal. No CVA tenderness. Cardio: regular rate and rhythm, S1, S2 normal, no murmur,  click, rub or gallop GI: soft, non-tender; bowel sounds normal; no masses,  no organomegaly Extremities: extremities normal, atraumatic, no cyanosis or edema Neurologic: Grossly normal Right breast no masses nipple discharge. Left mastectomy site looks great no nodularity or evidence of local recurrence ECOG PERFORMANCE STATUS: 0 - Asymptomatic  Blood pressure 168/68, pulse 67, temperature 97.8 F (36.6 C), temperature source Oral, resp. rate 20, height 5\' 7"  (1.702 m), weight 179 lb 12.8 oz (81.557 kg).  LABORATORY DATA: Lab Results  Component Value Date   WBC 5.6 04/21/2012   HGB 13.1 04/21/2012   HCT 39.2 04/21/2012   MCV 88.5 04/21/2012   PLT 257 04/21/2012      Chemistry      Component Value Date/Time   NA 142 04/21/2012 0941   NA 137 03/05/2012 1117   K 4.6 04/21/2012 0941   K 3.5 03/05/2012 1117   CL 107 04/21/2012 0941   CL 101 03/05/2012 1117   CO2 26 04/21/2012 0941   CO2 24 03/05/2012 1117   BUN 11.5 04/21/2012 0941   BUN 10 03/05/2012 1117   CREATININE 1.0 04/21/2012 0941   CREATININE 0.76 03/05/2012 1117      Component Value Date/Time   CALCIUM 9.5 04/21/2012 0941   CALCIUM 9.5 03/05/2012 1117   ALKPHOS 50 04/21/2012 0941   ALKPHOS 71 02/29/2012 1334   AST 18 04/21/2012 0941   AST 92* 02/29/2012 1334   ALT 14 04/21/2012 0941   ALT 91* 02/29/2012 1334   BILITOT 0.59 04/21/2012 0941   BILITOT 0.5 02/29/2012 1334       RADIOGRAPHIC STUDIES:  No results found.  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 Patient underwent a left mastectomy in March 2011. Overall she has done well and she has no evidence of local recurrence. Her mammograms are up-to-date for her right breast.   PLAN:   #1 I will see the patient back in 4 months time. If at that time she is doing well and I can continue to see her again and a yearly basis.  All questions were answered. The patient knows to call the clinic with any problems, questions or concerns. We can  certainly see the patient much sooner if necessary.  I spent 25 minutes counseling the patient face to face. The total time spent in the appointment was 30 minutes.    Drue Second, MD Medical/Oncology [facility] [phone] (beeper) [phone] (Office)  04/21/2012, 6:34 PM

## 2012-04-21 NOTE — Patient Instructions (Addendum)
I will see you back in 4 months for follow up  Continue to get INR checked at Dr. Charlott Rakes office

## 2012-04-25 DIAGNOSIS — M79609 Pain in unspecified limb: Secondary | ICD-10-CM | POA: Diagnosis not present

## 2012-04-25 DIAGNOSIS — M7150 Other bursitis, not elsewhere classified, unspecified site: Secondary | ICD-10-CM | POA: Diagnosis not present

## 2012-04-25 DIAGNOSIS — M722 Plantar fascial fibromatosis: Secondary | ICD-10-CM | POA: Diagnosis not present

## 2012-04-25 DIAGNOSIS — M7730 Calcaneal spur, unspecified foot: Secondary | ICD-10-CM | POA: Diagnosis not present

## 2012-04-29 ENCOUNTER — Encounter: Payer: Self-pay | Admitting: Cardiology

## 2012-04-29 ENCOUNTER — Ambulatory Visit (INDEPENDENT_AMBULATORY_CARE_PROVIDER_SITE_OTHER): Payer: 59 | Admitting: Cardiology

## 2012-04-29 VITALS — BP 130/90 | HR 60 | Ht 67.0 in | Wt 179.4 lb

## 2012-04-29 DIAGNOSIS — Z853 Personal history of malignant neoplasm of breast: Secondary | ICD-10-CM | POA: Diagnosis not present

## 2012-04-29 DIAGNOSIS — I10 Essential (primary) hypertension: Secondary | ICD-10-CM

## 2012-04-29 DIAGNOSIS — R0989 Other specified symptoms and signs involving the circulatory and respiratory systems: Secondary | ICD-10-CM

## 2012-04-29 DIAGNOSIS — I2699 Other pulmonary embolism without acute cor pulmonale: Secondary | ICD-10-CM

## 2012-04-29 DIAGNOSIS — E7800 Pure hypercholesterolemia, unspecified: Secondary | ICD-10-CM

## 2012-04-29 DIAGNOSIS — I2510 Atherosclerotic heart disease of native coronary artery without angina pectoris: Secondary | ICD-10-CM

## 2012-04-29 NOTE — Assessment & Plan Note (Signed)
Borderline control.  Should monitor at home.

## 2012-04-29 NOTE — Addendum Note (Signed)
Addended by: Iona Coach on: 04/29/2012 02:05 PM   Modules accepted: Orders, Medications

## 2012-04-29 NOTE — Patient Instructions (Addendum)
Your physician has requested that you have an echocardiogram in August. Echocardiography is a painless test that uses sound waves to create images of your heart. It provides your doctor with information about the size and shape of your heart and how well your heart's chambers and valves are working. This procedure takes approximately one hour. There are no restrictions for this procedure.  Your physician has requested that you have a lower extremity venous duplex in August. This test is an ultrasound of the veins in the legs. It looks at venous blood flow that carries blood from the heart to the legs. Allow one hour for a Lower Venous exam.  There are no restrictions or special instructions.  Your physician recommends that you schedule a follow-up appointment in: August with Dr Shirlee Latch (previous pt of Dr Riley Kill)  Your physician recommends that you continue on your current medications as directed. Please refer to the Current Medication list given to you today.  Your physician has requested that you have a carotid duplex in August. This test is an ultrasound of the carotid arteries in your neck. It looks at blood flow through these arteries that supply the brain with blood. Allow one hour for this exam. There are no restrictions or special instructions.  Your physician has requested that you regularly monitor and record your blood pressure readings at home. Please use the same machine at the same time of day to check your readings and record them to bring to your follow-up visit.

## 2012-04-29 NOTE — Assessment & Plan Note (Signed)
Monitored in primary care office.  Due for labs with upcoming physical.

## 2012-04-29 NOTE — Assessment & Plan Note (Signed)
The patient remains stable without cardiac complaints.  Continued medical management.

## 2012-04-29 NOTE — Assessment & Plan Note (Signed)
See recent oncology note.

## 2012-04-29 NOTE — Addendum Note (Signed)
Addended by: Iona Coach on: 04/29/2012 02:08 PM   Modules accepted: Orders

## 2012-04-29 NOTE — Progress Notes (Signed)
HPI:  She is doing well.  She came in with a PE in February, the etiology of which was unclear.  Her greater SV on the left had thrombus, but there was no DVT.  Patient has not had more symptoms, she's not been having a lot of problems since the original diagnosis. They remain unclear as to what led to this. I have reviewed the current data in detail, including her echocardiographic reports, the CT reports, and a venous ultrasound that was done.  During hospitalization she had a coagulation workup, and is seen the oncology team in followup.  Current Outpatient Prescriptions  Medication Sig Dispense Refill  . acetaminophen (TYLENOL) 500 MG tablet Take 1,000 mg by mouth every 6 (six) hours as needed for pain.      [name] ALPRAZolam (XANAX) 0.25 MG tablet Take 0.25 mg by mouth 3 (three) times daily as needed. For anxiety      . aspirin 81 MG tablet Take 81 mg by mouth daily.        [name] glimepiride (AMARYL) 2 MG tablet Take 2 mg by mouth at bedtime.       . metFORMIN (GLUCOPHAGE-XR) 500 MG 24 hr tablet Take 1,000 mg by mouth 2 (two) times daily.        . sertraline (ZOLOFT) 50 MG tablet Take 50 mg by mouth daily.        . simvastatin (ZOCOR) 40 MG tablet Take 40 mg by mouth every evening.      . warfarin (COUMADIN) 5 MG tablet Take 2.5 mg by mouth daily. Take 0.5 tab (2.5 mg) on 2.21.2014, then 1 tab daily.      [name] HYDROcodone-acetaminophen (NORCO/VICODIN) 5-325 MG per tablet Take 1-2 tablets by mouth every 4 (four) hours as needed.  15 tablet  0  . ondansetron (ZOFRAN ODT) 8 MG disintegrating tablet Take 1 tablet (8 mg total) by mouth every 8 (eight) hours as needed for nausea.  20 tablet  0  . prochlorperazine (COMPAZINE) 10 MG tablet Take 1 tablet (10 mg total) by mouth 2 (two) times daily as needed. Headache, nausea  10 tablet  0  . topiramate (TOPAMAX) 25 MG tablet Take 1 tablet (25 mg total) by mouth 2 (two) times daily.  60 tablet  0   No current facility-administered medications for this visit.     Allergies  Allergen Reactions  . Niacin Other (See Comments)    burning    Past Medical History  Diagnosis Date  . Coronary artery disease   . Hypercholesteremia   . Hypertension   . Diabetes mellitus   . Reflux esophagitis   . Depression   . GERD (gastroesophageal reflux disease)   . Acute MI   . Cancer     breast  . Pulmonary embolism     Past Surgical History  Procedure Laterality Date  . Coronary artery bypass graft      x4  . Ankle surgery      left  . Cholecystectomy    . Cataract extraction, bilateral    . Right renal detachment surgery    . Eye surgery      right  . Mastectomy      Family History  Problem Relation Age of Onset  . Coronary artery disease Mother 22    status post CABG   . Heart attack Father 43    deceased secondary to fatal  first myocardial infarction  . Coronary artery disease Sister 72    alive  had bypass surgery  . Cancer Sister   . Deep vein thrombosis Cousin     Neice had after knee surgery    History   Social History  . Marital Status: Married    Spouse Name: N/A    Number of Children: N/A  . Years of Education: N/A   Occupational History  . Not on file.   Social History Main Topics  . Smoking status: Former Smoker    Quit date: 01/13/2004  . Smokeless tobacco: Not on file  . Alcohol Use: No  . Drug Use: No  . Sexually Active: Not on file   Other Topics Concern  . Not on file   Social History Narrative  . No narrative on file    ROS: Please see the HPI.  All other systems reviewed and negative.  PHYSICAL EXAM:  BP 130/90  Pulse 60  Ht 5\' 7"  (1.702 m)  Wt 179 lb 6.4 oz (81.375 kg)  BMI 28.09 kg/m2  General: Well developed, well nourished, in no acute distress. Head:  Normocephalic and atraumatic. Neck: no JVD Lungs: Clear to auscultation and percussion. Heart: Normal S1 and S2.  No sig murmur Abdomen:  Normal bowel sounds; soft; non tender; no organomegaly Pulses: Pulses normal in all 4  extremities. Extremities: No clubbing or cyanosis. Slight Left leg swelling greater than right.   Neurologic: Alert and oriented x 3.  EKG:  NSR.  Left axis deviation.  IRBBB.  No acute changes.    ECHO at time of PE  Study Conclusions  - Left ventricle: The cavity size was normal. Wall thickness was increased in a pattern of mild LVH. Systolic function was normal. The estimated ejection fraction was in the range of 50% to 55%. - Mitral valve: Mild regurgitation.  CT SCAN  Mediastinum: Unremarkable CT appearance of the thyroid gland. Nonspecific not enlarged by CT criteria but slightly prominent prevascular lymph nodes measuring up to 7 mm in short axis. Mildly patulous thoracic esophagus. No soft tissue mass.  Heart/Vascular: Opacification of the pulmonary arterial tree to the proximal subsegmental level. Central filling defects consistent with acute pulmonary emboli identified in the right lower lobe or pulmonary artery extending into the segmental and subsegmental branches. Additional filling defects identified within the segmental pulmonary arteries supplying the left lower lobe. There is mild cardiomegaly with right atrial enlargement. The patient is status post median sternotomy with evidence of prior multivessel CABG. Atherosclerotic vascular calcifications identified throughout the coronary arteries. No pericardial effusion.  Lungs/Pleura: Subsegmental atelectasis in the bilateral lower lobes. Mild subpleural lipomatosis on the left posteriorly. Evaluation for small pulmonary nodules is limited by respiratory motion. Mild diffuse bronchial wall thickening. No suspicious pulmonary nodule. No pleural effusion or pneumothorax.  Upper Abdomen: Unremarkable visualized upper abdomen.  Bones: No acute fracture or aggressive appearing lytic or blastic osseous lesion.  IMPRESSION:  1. Positive for pulmonary emboli. There are bilateral lower lobe pulmonary emboli to the  lobar level on the right and segmental level on the left. 2. Cardiomegaly with right atrial enlargement and surgical changes of prior multi vessel CABG. 3. Bilateral lower lobe subsegmental atelectasis may be related to VQ mismatch from the pulmonary emboli. 4. Nonspecific mediastinal lymph nodes which are not enlarged by CT criteria and likely reactive in nature.  Critical Value/emergent results were called by telephone at the time of interpretation on February 29, 2012 at 07:25 p.m. to Dr. Gwyneth Sprout, who verbally acknowledged these results.   Original Report Authenticated By: Vilma Prader  Archer Asa, M.D.        Last Resulted: 02/29/12 7:30 PM             ASSESSMENT AND PLAN:

## 2012-04-29 NOTE — Assessment & Plan Note (Signed)
Has 40-59% RICA stenosis.  Needs fu doppler.

## 2012-04-29 NOTE — Assessment & Plan Note (Signed)
On chronic warfarin treatment.  Etiology of PE unclear.  Did have superficial thrombus in L GSV.  Minimum treatment 6 months.  No obvious cancer recurrence, nor was there hematologic issue.  No predisposing factors, so risk of re could be higher.  FU six months after initiation with Dr. Shirlee Latch with 2D echo and venous doppler repeat.

## 2012-05-02 DIAGNOSIS — M65979 Unspecified synovitis and tenosynovitis, unspecified ankle and foot: Secondary | ICD-10-CM | POA: Diagnosis not present

## 2012-05-02 DIAGNOSIS — M25579 Pain in unspecified ankle and joints of unspecified foot: Secondary | ICD-10-CM | POA: Diagnosis not present

## 2012-05-02 DIAGNOSIS — M659 Synovitis and tenosynovitis, unspecified: Secondary | ICD-10-CM | POA: Diagnosis not present

## 2012-05-02 DIAGNOSIS — M722 Plantar fascial fibromatosis: Secondary | ICD-10-CM | POA: Diagnosis not present

## 2012-05-05 ENCOUNTER — Ambulatory Visit (INDEPENDENT_AMBULATORY_CARE_PROVIDER_SITE_OTHER): Payer: 59

## 2012-05-05 DIAGNOSIS — I872 Venous insufficiency (chronic) (peripheral): Secondary | ICD-10-CM

## 2012-05-05 DIAGNOSIS — I2699 Other pulmonary embolism without acute cor pulmonale: Secondary | ICD-10-CM

## 2012-05-05 LAB — POCT INR: INR: 1.9

## 2012-05-09 DIAGNOSIS — M659 Synovitis and tenosynovitis, unspecified: Secondary | ICD-10-CM | POA: Diagnosis not present

## 2012-05-09 DIAGNOSIS — M722 Plantar fascial fibromatosis: Secondary | ICD-10-CM | POA: Diagnosis not present

## 2012-05-09 DIAGNOSIS — M65979 Unspecified synovitis and tenosynovitis, unspecified ankle and foot: Secondary | ICD-10-CM | POA: Diagnosis not present

## 2012-05-09 DIAGNOSIS — M7150 Other bursitis, not elsewhere classified, unspecified site: Secondary | ICD-10-CM | POA: Diagnosis not present

## 2012-05-16 ENCOUNTER — Encounter (INDEPENDENT_AMBULATORY_CARE_PROVIDER_SITE_OTHER): Payer: Medicare Other | Admitting: Ophthalmology

## 2012-05-16 ENCOUNTER — Ambulatory Visit (INDEPENDENT_AMBULATORY_CARE_PROVIDER_SITE_OTHER): Payer: Medicare Other | Admitting: Pharmacist

## 2012-05-16 DIAGNOSIS — H4310 Vitreous hemorrhage, unspecified eye: Secondary | ICD-10-CM | POA: Diagnosis not present

## 2012-05-16 DIAGNOSIS — H2100 Hyphema, unspecified eye: Secondary | ICD-10-CM

## 2012-05-16 DIAGNOSIS — H43819 Vitreous degeneration, unspecified eye: Secondary | ICD-10-CM

## 2012-05-16 DIAGNOSIS — H33009 Unspecified retinal detachment with retinal break, unspecified eye: Secondary | ICD-10-CM

## 2012-05-16 DIAGNOSIS — I2699 Other pulmonary embolism without acute cor pulmonale: Secondary | ICD-10-CM | POA: Diagnosis not present

## 2012-05-16 DIAGNOSIS — I872 Venous insufficiency (chronic) (peripheral): Secondary | ICD-10-CM

## 2012-05-16 DIAGNOSIS — D3130 Benign neoplasm of unspecified choroid: Secondary | ICD-10-CM | POA: Diagnosis not present

## 2012-05-16 LAB — POCT INR: INR: 2.6

## 2012-05-19 ENCOUNTER — Ambulatory Visit (INDEPENDENT_AMBULATORY_CARE_PROVIDER_SITE_OTHER): Payer: Medicare Other | Admitting: Surgery

## 2012-05-19 ENCOUNTER — Encounter (INDEPENDENT_AMBULATORY_CARE_PROVIDER_SITE_OTHER): Payer: Self-pay | Admitting: Surgery

## 2012-05-19 VITALS — BP 144/78 | HR 64 | Temp 98.3°F | Resp 18 | Ht 67.0 in | Wt 178.0 lb

## 2012-05-19 DIAGNOSIS — Z853 Personal history of malignant neoplasm of breast: Secondary | ICD-10-CM | POA: Diagnosis not present

## 2012-05-19 NOTE — Progress Notes (Addendum)
[facility]  Ovidio Kin, MD,  FACS [address] [place]  [address] [place], [place]    [address] Phone:  [phone] FAX:  [phone]   Re:   Taylor [name] DOB:   September 29, 1945 MRN:   [id number]  ASSESSMENT AND PLAN: 1.  Left breast cancer, high grade DCIS.  Tis, N0.  ER - 99%, PR - 52%  Sees Dr. Cherene Altes.  Decided against tamoxifen.  Left mastectomy 03/21/2009.  Disease free.  See me back in one year.  We will start alternating with Dr. Welton Flakes (at 6 month offsets).  2.  Mild left arm lymphedema.  Unchanged.  3.  Diabetes Mellitus. 4.  Hypertension. 5.  Coronary artery disease. 6.  PE in February, 2014  Started as superficial cellulitis  Now on coumadin, INR 2.6 7.  Hemorrhage in right eye, this happened this past Sunday - 05/15/2012.  Seeing Dr. Rudene Christians  HISTORY OF PRESENT ILLNESS: Chief Complaint  Patient presents with  . Breast Cancer Long Term Follow Up    long term follow up breast   Taylor [name] is a 67 y.o. (DOB: [birth year])  white female who is a patient of ROSS,CHARLES ALAN, MD and comes to me today for follow up of left breast cancer.  She's had a lot of issues with a PE that she had in February, 2014.  Because of headaches, she had a CT scan., but this was negative.  She is doing well from the breast cancer.  But because of the issues with the PE, she has not updated her mammograms.  I encouraged her to do this.  PHYSICAL EXAM: BP 144/78  Pulse 64  Temp(Src) 98.3 F (36.8 C)  Resp 18  Ht 5\' 7"  (1.702 m)  Wt 178 lb (80.74 kg)  BMI 27.87 kg/m2  HEENT:  Pupils equal.  Dentition good.  No injury. NECK:  Supple.  No thyroid mass. LYMPH NODES:  No cervical, supraclavicular, or axillary adenopathy. BREASTS -  RIGHT:  No palpable mass or nodule.  No nipple discharge.   LEFT:  Left breast absent. No palpable mass or nodule.  No nipple discharge. UPPER EXTREMITIES:  I don't appreciate much lymphedema of left upper arm today.  DATA  REVIEWED: Last mammogram - 03/17/2011 - neg.  She knows that she needs to update her mammogram.  Ovidio Kin, MD, FACS Office:  [phone]

## 2012-05-23 ENCOUNTER — Ambulatory Visit (INDEPENDENT_AMBULATORY_CARE_PROVIDER_SITE_OTHER): Payer: Medicare Other | Admitting: Ophthalmology

## 2012-05-23 DIAGNOSIS — H4310 Vitreous hemorrhage, unspecified eye: Secondary | ICD-10-CM | POA: Diagnosis not present

## 2012-05-23 DIAGNOSIS — H43819 Vitreous degeneration, unspecified eye: Secondary | ICD-10-CM

## 2012-05-23 DIAGNOSIS — Z7901 Long term (current) use of anticoagulants: Secondary | ICD-10-CM | POA: Diagnosis not present

## 2012-05-23 DIAGNOSIS — H33009 Unspecified retinal detachment with retinal break, unspecified eye: Secondary | ICD-10-CM | POA: Diagnosis not present

## 2012-05-23 DIAGNOSIS — D3130 Benign neoplasm of unspecified choroid: Secondary | ICD-10-CM

## 2012-05-23 DIAGNOSIS — H3560 Retinal hemorrhage, unspecified eye: Secondary | ICD-10-CM | POA: Diagnosis not present

## 2012-05-23 DIAGNOSIS — I2699 Other pulmonary embolism without acute cor pulmonale: Secondary | ICD-10-CM | POA: Diagnosis not present

## 2012-05-25 ENCOUNTER — Encounter (INDEPENDENT_AMBULATORY_CARE_PROVIDER_SITE_OTHER): Payer: Medicare Other | Admitting: Ophthalmology

## 2012-05-25 DIAGNOSIS — D3130 Benign neoplasm of unspecified choroid: Secondary | ICD-10-CM | POA: Diagnosis not present

## 2012-05-25 DIAGNOSIS — H4310 Vitreous hemorrhage, unspecified eye: Secondary | ICD-10-CM

## 2012-05-25 DIAGNOSIS — H5200 Hypermetropia, unspecified eye: Secondary | ICD-10-CM | POA: Diagnosis not present

## 2012-05-25 DIAGNOSIS — H43819 Vitreous degeneration, unspecified eye: Secondary | ICD-10-CM

## 2012-05-26 DIAGNOSIS — H3560 Retinal hemorrhage, unspecified eye: Secondary | ICD-10-CM | POA: Diagnosis not present

## 2012-05-26 DIAGNOSIS — Z7901 Long term (current) use of anticoagulants: Secondary | ICD-10-CM | POA: Diagnosis not present

## 2012-05-26 DIAGNOSIS — I2699 Other pulmonary embolism without acute cor pulmonale: Secondary | ICD-10-CM | POA: Diagnosis not present

## 2012-06-01 ENCOUNTER — Ambulatory Visit (INDEPENDENT_AMBULATORY_CARE_PROVIDER_SITE_OTHER): Payer: 59 | Admitting: Ophthalmology

## 2012-06-01 DIAGNOSIS — H3560 Retinal hemorrhage, unspecified eye: Secondary | ICD-10-CM | POA: Diagnosis not present

## 2012-06-01 DIAGNOSIS — Z7901 Long term (current) use of anticoagulants: Secondary | ICD-10-CM | POA: Diagnosis not present

## 2012-06-08 ENCOUNTER — Ambulatory Visit (INDEPENDENT_AMBULATORY_CARE_PROVIDER_SITE_OTHER): Payer: Medicare Other | Admitting: Ophthalmology

## 2012-06-08 DIAGNOSIS — E11319 Type 2 diabetes mellitus with unspecified diabetic retinopathy without macular edema: Secondary | ICD-10-CM

## 2012-06-08 DIAGNOSIS — I10 Essential (primary) hypertension: Secondary | ICD-10-CM

## 2012-06-08 DIAGNOSIS — H43819 Vitreous degeneration, unspecified eye: Secondary | ICD-10-CM

## 2012-06-08 DIAGNOSIS — D3130 Benign neoplasm of unspecified choroid: Secondary | ICD-10-CM

## 2012-06-08 DIAGNOSIS — H4310 Vitreous hemorrhage, unspecified eye: Secondary | ICD-10-CM

## 2012-06-08 DIAGNOSIS — H35039 Hypertensive retinopathy, unspecified eye: Secondary | ICD-10-CM

## 2012-06-08 DIAGNOSIS — E1139 Type 2 diabetes mellitus with other diabetic ophthalmic complication: Secondary | ICD-10-CM

## 2012-06-08 DIAGNOSIS — E1165 Type 2 diabetes mellitus with hyperglycemia: Secondary | ICD-10-CM | POA: Diagnosis not present

## 2012-06-15 DIAGNOSIS — Z7901 Long term (current) use of anticoagulants: Secondary | ICD-10-CM | POA: Diagnosis not present

## 2012-06-21 DIAGNOSIS — Z7901 Long term (current) use of anticoagulants: Secondary | ICD-10-CM | POA: Diagnosis not present

## 2012-06-21 DIAGNOSIS — J069 Acute upper respiratory infection, unspecified: Secondary | ICD-10-CM | POA: Diagnosis not present

## 2012-06-28 DIAGNOSIS — Z7901 Long term (current) use of anticoagulants: Secondary | ICD-10-CM | POA: Diagnosis not present

## 2012-07-05 DIAGNOSIS — Z7901 Long term (current) use of anticoagulants: Secondary | ICD-10-CM | POA: Diagnosis not present

## 2012-07-05 DIAGNOSIS — H3560 Retinal hemorrhage, unspecified eye: Secondary | ICD-10-CM | POA: Diagnosis not present

## 2012-07-05 DIAGNOSIS — I2699 Other pulmonary embolism without acute cor pulmonale: Secondary | ICD-10-CM | POA: Diagnosis not present

## 2012-07-06 DIAGNOSIS — E7800 Pure hypercholesterolemia, unspecified: Secondary | ICD-10-CM | POA: Diagnosis not present

## 2012-07-06 DIAGNOSIS — I10 Essential (primary) hypertension: Secondary | ICD-10-CM | POA: Diagnosis not present

## 2012-07-06 DIAGNOSIS — F411 Generalized anxiety disorder: Secondary | ICD-10-CM | POA: Diagnosis not present

## 2012-07-06 DIAGNOSIS — Z0000 Encounter for general adult medical examination without abnormal findings: Secondary | ICD-10-CM | POA: Diagnosis not present

## 2012-07-06 DIAGNOSIS — E119 Type 2 diabetes mellitus without complications: Secondary | ICD-10-CM | POA: Diagnosis not present

## 2012-08-04 DIAGNOSIS — Z7901 Long term (current) use of anticoagulants: Secondary | ICD-10-CM | POA: Diagnosis not present

## 2012-08-18 ENCOUNTER — Other Ambulatory Visit (INDEPENDENT_AMBULATORY_CARE_PROVIDER_SITE_OTHER): Payer: Self-pay | Admitting: Surgery

## 2012-08-18 ENCOUNTER — Other Ambulatory Visit: Payer: Self-pay

## 2012-08-18 DIAGNOSIS — Z853 Personal history of malignant neoplasm of breast: Secondary | ICD-10-CM

## 2012-08-18 DIAGNOSIS — Z1231 Encounter for screening mammogram for malignant neoplasm of breast: Secondary | ICD-10-CM

## 2012-08-18 DIAGNOSIS — Z9012 Acquired absence of left breast and nipple: Secondary | ICD-10-CM

## 2012-08-26 ENCOUNTER — Telehealth: Payer: Self-pay | Admitting: Oncology

## 2012-08-26 ENCOUNTER — Encounter: Payer: Self-pay | Admitting: Oncology

## 2012-08-26 ENCOUNTER — Ambulatory Visit (HOSPITAL_BASED_OUTPATIENT_CLINIC_OR_DEPARTMENT_OTHER): Payer: Medicare Other | Admitting: Oncology

## 2012-08-26 ENCOUNTER — Other Ambulatory Visit (HOSPITAL_BASED_OUTPATIENT_CLINIC_OR_DEPARTMENT_OTHER): Payer: Medicare Other | Admitting: Lab

## 2012-08-26 VITALS — BP 127/72 | HR 64 | Temp 97.4°F | Resp 18 | Ht 67.0 in | Wt 183.2 lb

## 2012-08-26 DIAGNOSIS — Z170 Estrogen receptor positive status [ER+]: Secondary | ICD-10-CM

## 2012-08-26 DIAGNOSIS — I2699 Other pulmonary embolism without acute cor pulmonale: Secondary | ICD-10-CM

## 2012-08-26 DIAGNOSIS — Z86711 Personal history of pulmonary embolism: Secondary | ICD-10-CM | POA: Diagnosis not present

## 2012-08-26 DIAGNOSIS — Z853 Personal history of malignant neoplasm of breast: Secondary | ICD-10-CM

## 2012-08-26 DIAGNOSIS — Z86718 Personal history of other venous thrombosis and embolism: Secondary | ICD-10-CM

## 2012-08-26 DIAGNOSIS — D0590 Unspecified type of carcinoma in situ of unspecified breast: Secondary | ICD-10-CM | POA: Diagnosis not present

## 2012-08-26 DIAGNOSIS — I82409 Acute embolism and thrombosis of unspecified deep veins of unspecified lower extremity: Secondary | ICD-10-CM | POA: Diagnosis not present

## 2012-08-26 DIAGNOSIS — C50912 Malignant neoplasm of unspecified site of left female breast: Secondary | ICD-10-CM

## 2012-08-26 LAB — PROTIME-INR
INR: 2 (ref 2.00–3.50)
Protime: 24 Seconds — ABNORMAL HIGH (ref 10.6–13.4)

## 2012-08-26 LAB — CBC WITH DIFFERENTIAL/PLATELET
Basophils Absolute: 0 10*3/uL (ref 0.0–0.1)
EOS%: 2.6 % (ref 0.0–7.0)
Eosinophils Absolute: 0.2 10*3/uL (ref 0.0–0.5)
HGB: 12.9 g/dL (ref 11.6–15.9)
NEUT#: 4.4 10*3/uL (ref 1.5–6.5)
RDW: 12.7 % (ref 11.2–14.5)
lymph#: 2 10*3/uL (ref 0.9–3.3)

## 2012-08-26 LAB — COMPREHENSIVE METABOLIC PANEL (CC13)
ALT: 14 U/L (ref 0–55)
AST: 19 U/L (ref 5–34)
Alkaline Phosphatase: 50 U/L (ref 40–150)
Calcium: 9.2 mg/dL (ref 8.4–10.4)
Chloride: 107 mEq/L (ref 98–109)
Creatinine: 1 mg/dL (ref 0.6–1.1)
Potassium: 4.2 mEq/L (ref 3.5–5.1)

## 2012-08-26 NOTE — Telephone Encounter (Signed)
, °

## 2012-08-29 ENCOUNTER — Ambulatory Visit (HOSPITAL_COMMUNITY): Payer: Medicare Other | Attending: Cardiology | Admitting: Radiology

## 2012-08-29 DIAGNOSIS — I2699 Other pulmonary embolism without acute cor pulmonale: Secondary | ICD-10-CM

## 2012-08-29 DIAGNOSIS — E119 Type 2 diabetes mellitus without complications: Secondary | ICD-10-CM | POA: Diagnosis not present

## 2012-08-29 DIAGNOSIS — E785 Hyperlipidemia, unspecified: Secondary | ICD-10-CM | POA: Diagnosis not present

## 2012-08-29 DIAGNOSIS — C50919 Malignant neoplasm of unspecified site of unspecified female breast: Secondary | ICD-10-CM | POA: Diagnosis not present

## 2012-08-29 DIAGNOSIS — Z86711 Personal history of pulmonary embolism: Secondary | ICD-10-CM | POA: Diagnosis not present

## 2012-08-29 DIAGNOSIS — I2510 Atherosclerotic heart disease of native coronary artery without angina pectoris: Secondary | ICD-10-CM | POA: Diagnosis not present

## 2012-08-29 DIAGNOSIS — I10 Essential (primary) hypertension: Secondary | ICD-10-CM | POA: Diagnosis not present

## 2012-08-29 DIAGNOSIS — I5032 Chronic diastolic (congestive) heart failure: Secondary | ICD-10-CM

## 2012-08-29 DIAGNOSIS — I252 Old myocardial infarction: Secondary | ICD-10-CM | POA: Diagnosis not present

## 2012-08-29 NOTE — Progress Notes (Signed)
Echocardiogram performed.  

## 2012-08-31 ENCOUNTER — Ambulatory Visit
Admission: RE | Admit: 2012-08-31 | Discharge: 2012-08-31 | Disposition: A | Discharge status: Home / Self Care | Payer: Medicare Other | Source: Ambulatory Visit

## 2012-08-31 ENCOUNTER — Encounter (INDEPENDENT_AMBULATORY_CARE_PROVIDER_SITE_OTHER): Payer: Medicare Other

## 2012-08-31 DIAGNOSIS — M7989 Other specified soft tissue disorders: Secondary | ICD-10-CM

## 2012-08-31 DIAGNOSIS — I2699 Other pulmonary embolism without acute cor pulmonale: Secondary | ICD-10-CM

## 2012-08-31 DIAGNOSIS — I8000 Phlebitis and thrombophlebitis of superficial vessels of unspecified lower extremity: Secondary | ICD-10-CM | POA: Diagnosis not present

## 2012-08-31 DIAGNOSIS — Z1231 Encounter for screening mammogram for malignant neoplasm of breast: Secondary | ICD-10-CM

## 2012-08-31 DIAGNOSIS — Z86718 Personal history of other venous thrombosis and embolism: Secondary | ICD-10-CM | POA: Diagnosis not present

## 2012-08-31 DIAGNOSIS — Z9012 Acquired absence of left breast and nipple: Secondary | ICD-10-CM

## 2012-08-31 DIAGNOSIS — Z853 Personal history of malignant neoplasm of breast: Secondary | ICD-10-CM

## 2012-09-05 ENCOUNTER — Encounter (INDEPENDENT_AMBULATORY_CARE_PROVIDER_SITE_OTHER): Payer: Medicare Other

## 2012-09-05 ENCOUNTER — Ambulatory Visit (INDEPENDENT_AMBULATORY_CARE_PROVIDER_SITE_OTHER): Payer: Medicare Other | Admitting: Cardiology

## 2012-09-05 ENCOUNTER — Encounter: Payer: Self-pay | Admitting: Cardiology

## 2012-09-05 VITALS — BP 131/77 | HR 61 | Ht 67.5 in | Wt 178.4 lb

## 2012-09-05 DIAGNOSIS — I2699 Other pulmonary embolism without acute cor pulmonale: Secondary | ICD-10-CM | POA: Diagnosis not present

## 2012-09-05 DIAGNOSIS — E7800 Pure hypercholesterolemia, unspecified: Secondary | ICD-10-CM | POA: Diagnosis not present

## 2012-09-05 DIAGNOSIS — R0989 Other specified symptoms and signs involving the circulatory and respiratory systems: Secondary | ICD-10-CM

## 2012-09-05 DIAGNOSIS — I25810 Atherosclerosis of coronary artery bypass graft(s) without angina pectoris: Secondary | ICD-10-CM

## 2012-09-05 DIAGNOSIS — D689 Coagulation defect, unspecified: Secondary | ICD-10-CM | POA: Diagnosis not present

## 2012-09-05 DIAGNOSIS — I6529 Occlusion and stenosis of unspecified carotid artery: Secondary | ICD-10-CM

## 2012-09-05 DIAGNOSIS — I2510 Atherosclerotic heart disease of native coronary artery without angina pectoris: Secondary | ICD-10-CM | POA: Diagnosis not present

## 2012-09-05 NOTE — Patient Instructions (Addendum)
Stop aspirin.  Your physician recommends that you have lab today--cardiolipin antibodies/lupus anticoagulant panel  Your physician wants you to follow-up in: 6 months with Dr Shirlee Latch. (February 2015). You will receive a reminder letter in the mail two months in advance. If you don't receive a letter, please call our office to schedule the follow-up appointment.

## 2012-09-05 NOTE — Progress Notes (Signed)
Patient ID: Taylor [name], female   DOB: [birth year], 67 y.o.   MRN: [id number] PCP: Dr. Tenny Craw  67 yo with history of CAD s/p CABG and PE in 2/14 presents for cardiology followup.  She has been seen by Dr. Riley Kill in the past and is seen by me for the first time today.  She had CABG in 2006 and has not had a catheterization since that time.  She was found to have PE in 2/14 without known trigger.  She saw her oncologist after that with no sign of recurrent cancer.  She has been on warfarin since that time.  Of note, she had a recent right eye hemorrhage and INR is now being run 2-2.5.    Patient denies exertional chest pain or dyspnea.  She does not have exercise limitations.  No orthopnea, PND, lightheadedness or palpitations.    Labs (8/14): K 4.2, creatinine 1.0  PMH: 1. Depression 2. Breast cancer: s/p left mastectomy in 3/11.  3. PE 2/14: Uncertain etiology, no trigger.  Factor V Leiden negative, prothrombin gene mutation negative, but lupus anticoagulant positive.  Venous dopplers (8/14) with left greater saphenous vein superficial thrombosis.  4. Hyperlipidemia 5. Type II diabetes 6. HTN 7. Cholescystectomy 8. GERD 9. CAD: s/p CABG in 5/06 with LIMA-LAD, sequential SVG-D and OM, SVG-PDA.  Echo (8/14) with EF 60%, mild LVH, normal RV.  10. Carotid stenosis: carotids (8/14) with 40-59% bilateral ICA stenosis.   SH: Married, quit smoking in 2006, lives in [place].  FH: Mother and father with MIs, sister with CABG in 18.   ROS: All systems reviewed and negative except as per HPI.   Current Outpatient Prescriptions  Medication Sig Dispense Refill  . acetaminophen (TYLENOL) 500 MG tablet Take 1,000 mg by mouth every 6 (six) hours as needed for pain.      [name] ALPRAZolam (XANAX) 0.25 MG tablet Take 0.25 mg by mouth 3 (three) times daily as needed. For anxiety      . carvedilol (COREG) 6.25 MG tablet Take 6.25 mg by mouth 2 (two) times daily with a meal.      . glimepiride (AMARYL) 2 MG  tablet Take 2 mg by mouth at bedtime.       . metFORMIN (GLUCOPHAGE-XR) 500 MG 24 hr tablet Take 1,000 mg by mouth 2 (two) times daily.        . sertraline (ZOLOFT) 50 MG tablet Take 50 mg by mouth daily.        . simvastatin (ZOCOR) 40 MG tablet Take 40 mg by mouth every evening.      . warfarin (COUMADIN) 5 MG tablet Take 2.5 mg by mouth daily. Take 0.5 tab (2.5 mg) on 2.21.2014, then 1 tab daily.       No current facility-administered medications for this visit.    BP 131/77  Pulse 61  Ht 5' 7.5" (1.715 m)  Wt 80.922 kg (178 lb 6.4 oz)  BMI 27.51 kg/m2  SpO2 98% General: NAD Neck: No JVD, no thyromegaly or thyroid nodule.  Lungs: Clear to auscultation bilaterally with normal respiratory effort. CV: Nondisplaced PMI.  Heart regular S1/S2, no S3/S4, no murmur.  No peripheral edema.  No carotid bruit.  Normal pedal pulses.  Abdomen: Soft, nontender, no hepatosplenomegaly, no distention.  Skin: Intact without lesions or rashes.  Neurologic: Alert and oriented x 3.  Psych: Normal affect. Extremities: No clubbing or cyanosis.   Assessment/Plan: 1. CAD: Stable s/p CABG.  No ischemic symptoms.  Continue Coreg and  simvastatin. Given recent hemorrhage into her eye, I will have her stop ASA 81 and continue coumadin.  No advantage to being on both coumadin and ASA in the setting of stable CAD.  2. PE: Unprovoked PE in 2/14 so plan has been long-term anticoagulation.   On hypercoagulable workup, lupus anticoagulant was positive, raising concern for antiphospholipid antibody syndrome.  Had hemorrhage in her eye when INR was up to 4.  Now INR goal 2-2.5. - Would repeat lupus anticoagulant and also get anticardiolipin antibody for workup of possible APLAS.  - She asks about switching to Xarelto.  I would continue coumadin for now with lower INR goal rather than start a new med in setting of recent eye hemorrhage.   3. Carotid stenosis: Repeat carotid dopplers in 8/15.  4. Hyperlipidemia: I will try  to get a copy of recent lipids from PCP.   Followup 6 months.   Marca Ancona 09/05/2012

## 2012-09-06 LAB — CARDIOLIPIN ANTIBODIES, IGM+IGG: Anticardiolipin IgG: 6 GPL U/mL (ref <23)

## 2012-09-06 LAB — LUPUS ANTICOAGULANT PANEL
DRVVT: 48.8 secs — ABNORMAL HIGH (ref <42.9)
Lupus Anticoagulant: NOT DETECTED

## 2012-09-12 NOTE — Progress Notes (Signed)
OFFICE PROGRESS NOTE  CC  [name], MD [address] [place] [address] Dr. Ovidio Kin Dr. 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 Patient is status post left breast lumpectomy on 03/21/2009.  PRIOR THERAPY:  #1 patient presented with a high-grade DCIS she underwent a left breast lumpectomy that showed a DCIS that was ER positive PR positive.  #2 patient declined any chemopreventive therapy for the contralateral breast.  #3 patient with recent diagnosis of DVT and pulmonary embolism hospitalized at [facility]. She is now on Coumadin. Her INRs are being monitored by Dr. Tenny Craw.  CURRENT THERAPY:Observation  INTERVAL HISTORY: Taylor [name] 67 y.o. female returns for Followup visit today. I have not seen her for one year.Today she feels well she has no shortness of breath no chest pains or palpitations no swelling in her lower extremities. She does have INRs monitored by her primary care physician's office. And she will continue to do this.  MEDICAL HISTORY: Past Medical History  Diagnosis Date  . Coronary artery disease   . Hypercholesteremia   . Hypertension   . Diabetes mellitus   . Reflux esophagitis   . Depression   . GERD (gastroesophageal reflux disease)   . Acute MI   . Cancer     breast  . Pulmonary embolism     ALLERGIES:  is allergic to niacin.  MEDICATIONS:  Current Outpatient Prescriptions  Medication Sig Dispense Refill  . acetaminophen (TYLENOL) 500 MG tablet Take 1,000 mg by mouth every 6 (six) hours as needed for pain.      [name] ALPRAZolam (XANAX) 0.25 MG tablet Take 0.25 mg by mouth 3 (three) times daily as needed. For anxiety      . carvedilol (COREG) 6.25 MG tablet Take 6.25 mg by mouth 2 (two) times daily with a meal.      . glimepiride (AMARYL) 2 MG tablet Take 2 mg by mouth at bedtime.       . metFORMIN (GLUCOPHAGE-XR) 500 MG 24 hr tablet Take 1,000 mg by  mouth 2 (two) times daily.        . sertraline (ZOLOFT) 50 MG tablet Take 50 mg by mouth daily.        . simvastatin (ZOCOR) 40 MG tablet Take 40 mg by mouth every evening.      . warfarin (COUMADIN) 5 MG tablet Take 2.5 mg by mouth daily. Take 0.5 tab (2.5 mg) on 2.21.2014, then 1 tab daily.       No current facility-administered medications for this visit.    SURGICAL HISTORY:  Past Surgical History  Procedure Laterality Date  . Coronary artery bypass graft      x4  . Ankle surgery      left  . Cholecystectomy    . Cataract extraction, bilateral    . Right renal detachment surgery    . Eye surgery      right  . Mastectomy      REVIEW OF SYSTEMS:  Pertinent items are noted in HPI.   PHYSICAL EXAMINATION: General appearance: alert, cooperative and appears stated age Lymph nodes: Cervical, supraclavicular, and axillary nodes normal. Resp: clear to auscultation bilaterally and normal percussion bilaterally Back: symmetric, no curvature. ROM normal. No CVA tenderness. Cardio: regular rate and rhythm, S1, S2 normal, no murmur, click, rub or gallop GI: soft, non-tender; bowel sounds normal; no masses,  no organomegaly Extremities: extremities normal, atraumatic, no cyanosis  or edema Neurologic: Grossly normal Right breast no masses nipple discharge. Left mastectomy site looks great no nodularity or evidence of local recurrence ECOG PERFORMANCE STATUS: 0 - Asymptomatic  Blood pressure 127/72, pulse 64, temperature 97.4 F (36.3 C), temperature source Oral, resp. rate 18, height 5\' 7"  (1.702 m), weight 183 lb 3.2 oz (83.099 kg).  LABORATORY DATA: Lab Results  Component Value Date   WBC 6.9 08/26/2012   HGB 12.9 08/26/2012   HCT 39.0 08/26/2012   MCV 90.7 08/26/2012   PLT 266 08/26/2012      Chemistry      Component Value Date/Time   NA 141 08/26/2012 1034   NA 137 03/05/2012 1117   K 4.2 08/26/2012 1034   K 3.5 03/05/2012 1117   CL 107 04/21/2012 0941   CL 101 03/05/2012 1117    CO2 22 08/26/2012 1034   CO2 24 03/05/2012 1117   BUN 10.2 08/26/2012 1034   BUN 10 03/05/2012 1117   CREATININE 1.0 08/26/2012 1034   CREATININE 0.76 03/05/2012 1117      Component Value Date/Time   CALCIUM 9.2 08/26/2012 1034   CALCIUM 9.5 03/05/2012 1117   ALKPHOS 50 08/26/2012 1034   ALKPHOS 71 02/29/2012 1334   AST 19 08/26/2012 1034   AST 92* 02/29/2012 1334   ALT 14 08/26/2012 1034   ALT 91* 02/29/2012 1334   BILITOT 0.62 08/26/2012 1034   BILITOT 0.5 02/29/2012 1334       RADIOGRAPHIC STUDIES:  No results found.  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 Patient underwent a left mastectomy in March 2011. Overall she has done well and she has no evidence of local recurrence. Her mammograms are up-to-date for her right breast.   PLAN:   #1 overall patient is doing well. I will see her once a year now.  #2 she continues to be on anticoagulant. This is being monitored by her primary care physician  All questions were answered. The patient knows to call the clinic with any problems, questions or concerns. We can certainly see the patient much sooner if necessary.  I spent 15 minutes counseling the patient face to face. The total time spent in the appointment was 30 minutes.    Drue Second, MD Medical/Oncology [facility] [phone] (beeper) [phone] (Office)

## 2012-10-10 ENCOUNTER — Ambulatory Visit (INDEPENDENT_AMBULATORY_CARE_PROVIDER_SITE_OTHER): Payer: Medicare Other | Admitting: Ophthalmology

## 2012-10-10 DIAGNOSIS — E11319 Type 2 diabetes mellitus with unspecified diabetic retinopathy without macular edema: Secondary | ICD-10-CM

## 2012-10-10 DIAGNOSIS — I10 Essential (primary) hypertension: Secondary | ICD-10-CM | POA: Diagnosis not present

## 2012-10-10 DIAGNOSIS — E1139 Type 2 diabetes mellitus with other diabetic ophthalmic complication: Secondary | ICD-10-CM | POA: Diagnosis not present

## 2012-10-10 DIAGNOSIS — D3130 Benign neoplasm of unspecified choroid: Secondary | ICD-10-CM

## 2012-10-10 DIAGNOSIS — E11359 Type 2 diabetes mellitus with proliferative diabetic retinopathy without macular edema: Secondary | ICD-10-CM

## 2012-10-10 DIAGNOSIS — H43819 Vitreous degeneration, unspecified eye: Secondary | ICD-10-CM

## 2012-10-10 DIAGNOSIS — H35039 Hypertensive retinopathy, unspecified eye: Secondary | ICD-10-CM

## 2012-10-12 DIAGNOSIS — Z7901 Long term (current) use of anticoagulants: Secondary | ICD-10-CM | POA: Diagnosis not present

## 2012-10-14 DIAGNOSIS — Z7901 Long term (current) use of anticoagulants: Secondary | ICD-10-CM | POA: Diagnosis not present

## 2012-11-09 DIAGNOSIS — Z23 Encounter for immunization: Secondary | ICD-10-CM | POA: Diagnosis not present

## 2012-11-10 DIAGNOSIS — R159 Full incontinence of feces: Secondary | ICD-10-CM | POA: Diagnosis not present

## 2012-11-10 DIAGNOSIS — Z800 Family history of malignant neoplasm of digestive organs: Secondary | ICD-10-CM | POA: Diagnosis not present

## 2012-11-10 DIAGNOSIS — R152 Fecal urgency: Secondary | ICD-10-CM | POA: Diagnosis not present

## 2012-11-10 DIAGNOSIS — Z1211 Encounter for screening for malignant neoplasm of colon: Secondary | ICD-10-CM | POA: Diagnosis not present

## 2012-11-10 DIAGNOSIS — R198 Other specified symptoms and signs involving the digestive system and abdomen: Secondary | ICD-10-CM | POA: Diagnosis not present

## 2012-11-17 DIAGNOSIS — M533 Sacrococcygeal disorders, not elsewhere classified: Secondary | ICD-10-CM | POA: Diagnosis not present

## 2012-11-21 DIAGNOSIS — M533 Sacrococcygeal disorders, not elsewhere classified: Secondary | ICD-10-CM | POA: Diagnosis not present

## 2012-11-24 DIAGNOSIS — M533 Sacrococcygeal disorders, not elsewhere classified: Secondary | ICD-10-CM | POA: Diagnosis not present

## 2012-12-27 DIAGNOSIS — Z7901 Long term (current) use of anticoagulants: Secondary | ICD-10-CM | POA: Diagnosis not present

## 2012-12-27 DIAGNOSIS — I2699 Other pulmonary embolism without acute cor pulmonale: Secondary | ICD-10-CM | POA: Diagnosis not present

## 2012-12-27 DIAGNOSIS — E119 Type 2 diabetes mellitus without complications: Secondary | ICD-10-CM | POA: Diagnosis not present

## 2012-12-27 DIAGNOSIS — I10 Essential (primary) hypertension: Secondary | ICD-10-CM | POA: Diagnosis not present

## 2013-02-07 DIAGNOSIS — I2699 Other pulmonary embolism without acute cor pulmonale: Secondary | ICD-10-CM | POA: Diagnosis not present

## 2013-02-07 DIAGNOSIS — E119 Type 2 diabetes mellitus without complications: Secondary | ICD-10-CM | POA: Diagnosis not present

## 2013-02-07 DIAGNOSIS — Z7901 Long term (current) use of anticoagulants: Secondary | ICD-10-CM | POA: Diagnosis not present

## 2013-02-07 DIAGNOSIS — I10 Essential (primary) hypertension: Secondary | ICD-10-CM | POA: Diagnosis not present

## 2013-02-07 DIAGNOSIS — T81718A Complication of other artery following a procedure, not elsewhere classified, initial encounter: Secondary | ICD-10-CM | POA: Diagnosis not present

## 2013-02-08 ENCOUNTER — Other Ambulatory Visit: Payer: Self-pay | Admitting: Emergency Medicine

## 2013-02-09 ENCOUNTER — Ambulatory Visit (INDEPENDENT_AMBULATORY_CARE_PROVIDER_SITE_OTHER): Payer: Medicare Other | Admitting: Ophthalmology

## 2013-02-09 DIAGNOSIS — E1139 Type 2 diabetes mellitus with other diabetic ophthalmic complication: Secondary | ICD-10-CM

## 2013-02-09 DIAGNOSIS — I10 Essential (primary) hypertension: Secondary | ICD-10-CM | POA: Diagnosis not present

## 2013-02-09 DIAGNOSIS — E11319 Type 2 diabetes mellitus with unspecified diabetic retinopathy without macular edema: Secondary | ICD-10-CM

## 2013-02-09 DIAGNOSIS — H35359 Cystoid macular degeneration, unspecified eye: Secondary | ICD-10-CM | POA: Diagnosis not present

## 2013-02-09 DIAGNOSIS — H35379 Puckering of macula, unspecified eye: Secondary | ICD-10-CM

## 2013-02-09 DIAGNOSIS — E1165 Type 2 diabetes mellitus with hyperglycemia: Secondary | ICD-10-CM | POA: Diagnosis not present

## 2013-02-09 DIAGNOSIS — E11359 Type 2 diabetes mellitus with proliferative diabetic retinopathy without macular edema: Secondary | ICD-10-CM

## 2013-02-09 DIAGNOSIS — H35039 Hypertensive retinopathy, unspecified eye: Secondary | ICD-10-CM

## 2013-02-10 ENCOUNTER — Telehealth: Payer: Self-pay | Admitting: Oncology

## 2013-02-10 NOTE — Telephone Encounter (Signed)
, °

## 2013-03-02 ENCOUNTER — Ambulatory Visit: Payer: Medicare Other | Admitting: Cardiology

## 2013-03-07 ENCOUNTER — Ambulatory Visit: Payer: Medicare Other | Admitting: Cardiology

## 2013-03-28 DIAGNOSIS — Z961 Presence of intraocular lens: Secondary | ICD-10-CM | POA: Diagnosis not present

## 2013-03-28 DIAGNOSIS — D3130 Benign neoplasm of unspecified choroid: Secondary | ICD-10-CM | POA: Diagnosis not present

## 2013-03-28 DIAGNOSIS — H40059 Ocular hypertension, unspecified eye: Secondary | ICD-10-CM | POA: Diagnosis not present

## 2013-04-12 ENCOUNTER — Other Ambulatory Visit (HOSPITAL_BASED_OUTPATIENT_CLINIC_OR_DEPARTMENT_OTHER): Payer: Medicare Other

## 2013-04-12 ENCOUNTER — Ambulatory Visit (HOSPITAL_BASED_OUTPATIENT_CLINIC_OR_DEPARTMENT_OTHER): Payer: Medicare Other | Admitting: Oncology

## 2013-04-12 ENCOUNTER — Encounter: Payer: Self-pay | Admitting: Oncology

## 2013-04-12 ENCOUNTER — Telehealth: Payer: Self-pay | Admitting: Oncology

## 2013-04-12 ENCOUNTER — Ambulatory Visit (HOSPITAL_BASED_OUTPATIENT_CLINIC_OR_DEPARTMENT_OTHER): Payer: Medicare Other

## 2013-04-12 VITALS — BP 181/71 | HR 55 | Temp 98.1°F | Resp 18 | Ht 67.5 in | Wt 175.6 lb

## 2013-04-12 DIAGNOSIS — I82409 Acute embolism and thrombosis of unspecified deep veins of unspecified lower extremity: Secondary | ICD-10-CM

## 2013-04-12 DIAGNOSIS — Z170 Estrogen receptor positive status [ER+]: Secondary | ICD-10-CM

## 2013-04-12 DIAGNOSIS — D6859 Other primary thrombophilia: Secondary | ICD-10-CM | POA: Diagnosis not present

## 2013-04-12 DIAGNOSIS — Z853 Personal history of malignant neoplasm of breast: Secondary | ICD-10-CM

## 2013-04-12 DIAGNOSIS — C50912 Malignant neoplasm of unspecified site of left female breast: Secondary | ICD-10-CM

## 2013-04-12 DIAGNOSIS — I2699 Other pulmonary embolism without acute cor pulmonale: Secondary | ICD-10-CM

## 2013-04-12 DIAGNOSIS — D0590 Unspecified type of carcinoma in situ of unspecified breast: Secondary | ICD-10-CM

## 2013-04-12 LAB — COMPREHENSIVE METABOLIC PANEL (CC13)
ALT: 9 U/L (ref 0–55)
ANION GAP: 11 meq/L (ref 3–11)
AST: 16 U/L (ref 5–34)
Albumin: 4.1 g/dL (ref 3.5–5.0)
Alkaline Phosphatase: 51 U/L (ref 40–150)
BUN: 10.1 mg/dL (ref 7.0–26.0)
CALCIUM: 9.4 mg/dL (ref 8.4–10.4)
CHLORIDE: 108 meq/L (ref 98–109)
CO2: 24 meq/L (ref 22–29)
CREATININE: 0.8 mg/dL (ref 0.6–1.1)
GLUCOSE: 102 mg/dL (ref 70–140)
Potassium: 4.6 mEq/L (ref 3.5–5.1)
Sodium: 142 mEq/L (ref 136–145)
Total Bilirubin: 0.61 mg/dL (ref 0.20–1.20)
Total Protein: 7.4 g/dL (ref 6.4–8.3)

## 2013-04-12 LAB — CBC WITH DIFFERENTIAL/PLATELET
BASO%: 1.1 % (ref 0.0–2.0)
BASOS ABS: 0.1 10*3/uL (ref 0.0–0.1)
EOS ABS: 0.2 10*3/uL (ref 0.0–0.5)
EOS%: 2.6 % (ref 0.0–7.0)
HEMATOCRIT: 39.2 % (ref 34.8–46.6)
HEMOGLOBIN: 13 g/dL (ref 11.6–15.9)
LYMPH#: 2 10*3/uL (ref 0.9–3.3)
LYMPH%: 26.6 % (ref 14.0–49.7)
MCH: 29.9 pg (ref 25.1–34.0)
MCHC: 33.3 g/dL (ref 31.5–36.0)
MCV: 89.9 fL (ref 79.5–101.0)
MONO#: 0.4 10*3/uL (ref 0.1–0.9)
MONO%: 4.9 % (ref 0.0–14.0)
NEUT#: 4.8 10*3/uL (ref 1.5–6.5)
NEUT%: 64.8 % (ref 38.4–76.8)
Platelets: 283 10*3/uL (ref 145–400)
RBC: 4.35 10*6/uL (ref 3.70–5.45)
RDW: 12.8 % (ref 11.2–14.5)
WBC: 7.4 10*3/uL (ref 3.9–10.3)

## 2013-04-12 NOTE — Telephone Encounter (Signed)
, °

## 2013-04-13 LAB — LUPUS ANTICOAGULANT PANEL
DRVVT 1:1 Mix: 35.2 secs (ref <42.9)
DRVVT: 43 secs — ABNORMAL HIGH (ref <42.9)
LUPUS ANTICOAGULANT: NOT DETECTED
PTT Lupus Anticoagulant: 41.7 secs (ref 28.0–43.0)

## 2013-04-13 LAB — CARDIOLIPIN ANTIBODIES, IGG, IGM, IGA
ANTICARDIOLIPIN IGA: 6 U/mL (ref <22)
Anticardiolipin IgG: 8 GPL U/mL (ref <23)
Anticardiolipin IgM: 16 MPL U/mL — ABNORMAL HIGH (ref <11)

## 2013-04-14 NOTE — Progress Notes (Signed)
[place] OFFICE PROGRESS NOTE  Patient Care Team: Melinda Crutch, MD as PCP - General (Family Medicine) Lyman Speller, MD as Consulting Physician (Gynecology) Hillary Bow, MD as Consulting Physician (Cardiology) Deatra Robinson, MD as Consulting Physician (Internal Medicine) Dr. 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 Patient is status post left breast lumpectomy on 03/21/2009   SUMMARY OF ONCOLOGIC HISTORY: #1 patient presented with a high-grade DCIS she underwent a left breast lumpectomy that showed a DCIS that was ER positive PR positive.  #2 patient declined any chemopreventive therapy for the contralateral breast.  #3 patient with diagnosis of DVT and pulmonary embolism hospitalized at [facility]. She is now on Coumadin. Her INRs are being monitored by Dr. Harrington Challenger.   INTERVAL HISTORY: Taylor [name] 68 y.o. female returns for followup visit today. Her last visit with me was in August 2014. Clinically she seems to be doing well. She is on anticoagulation. She's not had any problems. She is on Coumadin. Question is whether she should remain on Coumadin lifelong or should she discontinued now that she has been on for about a year. I have reviewed her prior hypercoagulable workup. She indeed did have a lupus anticoagulant. I do think prior to making any kind of decision she may need to have a repeat lupus anticoagulant. Of note patient had quite an extensive thrombosis previously. Remainder of the review of systems as below  I have reviewed the past medical history, past surgical history, social history and family history with the patient and they are unchanged from previous note.  ALLERGIES:  is allergic to niacin.  MEDICATIONS:  Current Outpatient Prescriptions  Medication Sig Dispense Refill  . acetaminophen (TYLENOL) 500 MG tablet Take 1,000 mg by mouth every 6 (six) hours as needed for pain.       [name] ALPRAZolam (XANAX) 0.25 MG tablet Take 0.25 mg by mouth 3 (three) times daily as needed. For anxiety      . Brimonidine Tartrate-Timolol (COMBIGAN OP) Apply to eye 2 (two) times daily.      . carvedilol (COREG) 6.25 MG tablet Take 6.25 mg by mouth 2 (two) times daily with a meal.      . glimepiride (AMARYL) 2 MG tablet Take 2 mg by mouth at bedtime.       . hydroxypropyl methylcellulose (ISOPTO TEARS) 2.5 % ophthalmic solution 1 drop as needed for dry eyes.      [name] ketorolac (ACULAR) 0.4 % SOLN 1 drop 3 (three) times daily.      . metFORMIN (GLUCOPHAGE-XR) 500 MG 24 hr tablet Take 1,000 mg by mouth 2 (two) times daily.        . Probiotic Product (PROBIOTIC COLON SUPPORT PO) Take by mouth daily.      . sertraline (ZOLOFT) 50 MG tablet Take 50 mg by mouth daily.        . simvastatin (ZOCOR) 40 MG tablet Take 40 mg by mouth every evening.      . warfarin (COUMADIN) 5 MG tablet Take 2.5 mg by mouth daily. Take 0.5 tab (2.5 mg) on 2.21.2014, then 1 tab daily.       No current facility-administered medications for this visit.    REVIEW OF SYSTEMS:   Constitutional: Denies fevers, chills or abnormal weight loss Eyes: Denies blurriness of vision Ears, nose, mouth, throat, and face: Denies mucositis or sore throat Respiratory: Denies cough, dyspnea or wheezes  Cardiovascular: Denies palpitation, chest discomfort or lower extremity swelling Gastrointestinal:  Denies nausea, heartburn or change in bowel habits Skin: Denies abnormal skin rashes Lymphatics: Denies new lymphadenopathy or easy bruising Neurological:Denies numbness, tingling or new weaknesses Behavioral/Psych: Mood is stable, no new changes  All other systems were reviewed with the patient and are negative.  PHYSICAL EXAMINATION: ECOG PERFORMANCE STATUS: 0 - Asymptomatic  Filed Vitals:   04/12/13 1111  BP: 181/71  Pulse: 55  Temp: 98.1 F (36.7 C)  Resp: 18   Filed Weights   04/12/13 1111  Weight: 175 lb 9.6 oz (79.652  kg)    GENERAL:alert, no distress and comfortable SKIN: skin color, texture, turgor are normal, no rashes or significant lesions EYES: normal, Conjunctiva are pink and non-injected, sclera clear OROPHARYNX:no exudate, no erythema and lips, buccal mucosa, and tongue normal  NECK: supple, thyroid normal size, non-tender, without nodularity LYMPH:  no palpable lymphadenopathy in the cervical, axillary or inguinal LUNGS: clear to auscultation and percussion with normal breathing effort HEART: regular rate & rhythm and no murmurs and no lower extremity edema ABDOMEN:abdomen soft, non-tender and normal bowel sounds Musculoskeletal:no cyanosis of digits and no clubbing  NEURO: alert & oriented x 3 with fluent speech, no focal motor/sensory deficits Left breast: Lumpectomy scar looks very well-healed without any problems. Right breast: No masses nipple discharge or skin changes  LABORATORY DATA:  I have reviewed the data as listed    Component Value Date/Time   NA 142 04/12/2013 1052   NA 137 03/05/2012 1117   K 4.6 04/12/2013 1052   K 3.5 03/05/2012 1117   CL 107 04/21/2012 0941   CL 101 03/05/2012 1117   CO2 24 04/12/2013 1052   CO2 24 03/05/2012 1117   GLUCOSE 102 04/12/2013 1052   GLUCOSE 121* 04/21/2012 0941   GLUCOSE 177* 03/05/2012 1117   BUN 10.1 04/12/2013 1052   BUN 10 03/05/2012 1117   CREATININE 0.8 04/12/2013 1052   CREATININE 0.76 03/05/2012 1117   CALCIUM 9.4 04/12/2013 1052   CALCIUM 9.5 03/05/2012 1117   PROT 7.4 04/12/2013 1052   PROT 7.9 02/29/2012 1334   ALBUMIN 4.1 04/12/2013 1052   ALBUMIN 3.7 02/29/2012 1334   AST 16 04/12/2013 1052   AST 92* 02/29/2012 1334   ALT 9 04/12/2013 1052   ALT 91* 02/29/2012 1334   ALKPHOS 51 04/12/2013 1052   ALKPHOS 71 02/29/2012 1334   BILITOT 0.61 04/12/2013 1052   BILITOT 0.5 02/29/2012 1334   GFRNONAA 87* 03/05/2012 1117   GFRAA >90 03/05/2012 1117    No results found for this basename: SPEP, UPEP,  kappa and lambda light chains    Lab Results  Component  Value Date   WBC 7.4 04/12/2013   NEUTROABS 4.8 04/12/2013   HGB 13.0 04/12/2013   HCT 39.2 04/12/2013   MCV 89.9 04/12/2013   PLT 283 04/12/2013      Chemistry      Component Value Date/Time   NA 142 04/12/2013 1052   NA 137 03/05/2012 1117   K 4.6 04/12/2013 1052   K 3.5 03/05/2012 1117   CL 107 04/21/2012 0941   CL 101 03/05/2012 1117   CO2 24 04/12/2013 1052   CO2 24 03/05/2012 1117   BUN 10.1 04/12/2013 1052   BUN 10 03/05/2012 1117   CREATININE 0.8 04/12/2013 1052   CREATININE 0.76 03/05/2012 1117      Component Value Date/Time   CALCIUM 9.4 04/12/2013 1052   CALCIUM 9.5 03/05/2012 1117  ALKPHOS 51 04/12/2013 1052   ALKPHOS 71 02/29/2012 1334   AST 16 04/12/2013 1052   AST 92* 02/29/2012 1334   ALT 9 04/12/2013 1052   ALT 91* 02/29/2012 1334   BILITOT 0.61 04/12/2013 1052   BILITOT 0.5 02/29/2012 1334       RADIOGRAPHIC STUDIES: I have personally reviewed the radiological images as listed and agreed with the findings in the report. No results found.    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 Status post lumpectomy. She declined chemoprevention. She is on observation only.  #2 DVT and pulmonary embolism: Patient is on Coumadin her INRs are monitored by her primary care physician's office. Length of anticoagulation will be determined on repeat of her hypercoag workup. I have ordered a lupus anticoagulant as well as anticardiolipin antibodies. I will let the patient know about the results. Can then determine whether or not she did continue anticoagulation and definite versus discontinuation.  #3 I will see the patient back in 8 months in followup  Orders Placed This Encounter  Procedures  . Lupus anticoagulant panel    Standing Status: Future     Number of Occurrences: 1     Standing Expiration Date: 04/12/2014  . Cardiolipin antibodies, IgG, IgM, IgA    Standing Status: Future     Number of Occurrences: 1      Standing Expiration Date: 04/12/2014   All questions were answered. The patient knows to call the clinic with any problems, questions or concerns. No barriers to learning was detected. I spent 20 minutes counseling the patient face to face. The total time spent in the appointment was 25 minutes and more than 50% was on counseling and review of test results     Marcy Panning, MD 04/14/2013 10:54 AM

## 2013-04-18 DIAGNOSIS — H40059 Ocular hypertension, unspecified eye: Secondary | ICD-10-CM | POA: Diagnosis not present

## 2013-04-18 DIAGNOSIS — Z961 Presence of intraocular lens: Secondary | ICD-10-CM | POA: Diagnosis not present

## 2013-04-26 DIAGNOSIS — H40059 Ocular hypertension, unspecified eye: Secondary | ICD-10-CM | POA: Diagnosis not present

## 2013-04-27 ENCOUNTER — Telehealth: Payer: Self-pay | Admitting: *Deleted

## 2013-04-27 NOTE — Telephone Encounter (Signed)
Patient called reporting she has not received a call or letter about lab work drawn 04-12-2013.  Takes warfarin for the past year.  Would like to discontinue this but needs to know what lab results show.  My Primary Care Provider would like to know as well.  I also need to talk to someone about my August appointment.  I don't think I need that appointment since I was seen in April.  The December 03-2013 appointment is all I need.  Patient call back number: [phone].  Will notify providers. Patient's PCP monitors the coumadin and INR's.  Routed results to Dr. Melinda Crutch via Fax at this time.

## 2013-04-28 ENCOUNTER — Telehealth: Payer: Self-pay

## 2013-04-28 NOTE — Telephone Encounter (Signed)
Per KK, let patient know that her lupus anticoagulation test was negative and that she could discontinue her warfarin.  Pt voiced understanding.

## 2013-05-04 ENCOUNTER — Encounter: Payer: Self-pay | Admitting: Cardiology

## 2013-05-04 ENCOUNTER — Ambulatory Visit (INDEPENDENT_AMBULATORY_CARE_PROVIDER_SITE_OTHER): Payer: Medicare Other | Admitting: Cardiology

## 2013-05-04 VITALS — BP 144/78 | HR 62 | Ht 67.0 in | Wt 172.0 lb

## 2013-05-04 DIAGNOSIS — I2699 Other pulmonary embolism without acute cor pulmonale: Secondary | ICD-10-CM

## 2013-05-04 DIAGNOSIS — I2510 Atherosclerotic heart disease of native coronary artery without angina pectoris: Secondary | ICD-10-CM

## 2013-05-04 DIAGNOSIS — I10 Essential (primary) hypertension: Secondary | ICD-10-CM | POA: Diagnosis not present

## 2013-05-04 DIAGNOSIS — E7800 Pure hypercholesterolemia, unspecified: Secondary | ICD-10-CM | POA: Diagnosis not present

## 2013-05-04 MED ORDER — ASPIRIN EC 81 MG PO TBEC: 81.0000 mg | DELAYED_RELEASE_TABLET | Freq: Every day | ORAL | Status: DC

## 2013-05-04 NOTE — Patient Instructions (Addendum)
**Note De-Identified  Obfuscation** Your physician has recommended you make the following change in your medication: start taking Aspirin 81 mg daily  Your physician recommends that you return for lab work in: Tuesday May 09, 2013   Your physician wants you to follow-up in: 1 year. You will receive a reminder letter in the mail two months in advance. If you don't receive a letter, please call our office to schedule the follow-up appointment.

## 2013-05-06 NOTE — Progress Notes (Signed)
Patient ID: Taylor [name], female   DOB: 06-Mar-1945, 68 y.o.   MRN: [id number] PCP: Dr. Harrington Challenger  68 yo with history of CAD s/p CABG and PE in 2/14 presents for cardiology followup.  She has been seen by Dr. Lia Foyer in the past and is seen by me for the first time today.  She had CABG in 2006 and has not had a catheterization since that time.  She was found to have PE in 2/14 without known trigger.  She saw her oncologist after that with no sign of recurrent cancer.  She had a negative hypercoagulable workup and was told by her oncologist that she could stop warfarin (stopped it this week).    Patient denies exertional chest pain or dyspnea.  She does not have exercise limitations.  No orthopnea, PND, lightheadedness or palpitations.    Labs (8/14): K 4.2, creatinine 1.0 Labs (4/15): K 4.6, creatinine 0.8  PMH: 1. Depression 2. Breast cancer: s/p left mastectomy in 3/11.  3. PE 4/58: Uncertain etiology, no trigger.  Factor V Leiden negative, prothrombin gene mutation negative, but lupus anticoagulant positive.  Venous dopplers (8/14) with left greater saphenous vein superficial thrombosis.  Repeat antiphospholipid antibody workup negative in 8/14 and again in 1/15.  4. Hyperlipidemia 5. Type II diabetes 6. HTN 7. Cholescystectomy 8. GERD 9. CAD: s/p CABG in 5/06 with LIMA-LAD, sequential SVG-D and OM, SVG-PDA.  Echo (8/14) with EF 60%, mild LVH, normal RV.  10. Carotid stenosis: carotids (8/14) with 40-59% bilateral ICA stenosis.  11. Glaucoma  SH: Married, quit smoking in 2006, lives in [place].  FH: Mother and father with MIs, sister with CABG in 9.   ROS: All systems reviewed and negative except as per HPI.   Current Outpatient Prescriptions  Medication Sig Dispense Refill  . acetaminophen (TYLENOL) 500 MG tablet Take 1,000 mg by mouth every 6 (six) hours as needed for pain.      [name] ALPRAZolam (XANAX) 0.25 MG tablet Take 0.25 mg by mouth 3 (three) times daily as needed. For anxiety       . brimonidine-timolol (COMBIGAN) 0.2-0.5 % ophthalmic solution Place 1 drop into both eyes every 12 (twelve) hours.      . carvedilol (COREG) 6.25 MG tablet Take 6.25 mg by mouth 2 (two) times daily with a meal.      . dorzolamide (TRUSOPT) 2 % ophthalmic solution Place 1 drop into the right eye 2 (two) times daily.      [name] glimepiride (AMARYL) 2 MG tablet Take 2 mg by mouth at bedtime.       [name] ketorolac (ACULAR) 0.4 % SOLN 1 drop 3 (three) times daily.      . metFORMIN (GLUCOPHAGE-XR) 500 MG 24 hr tablet Take 1,000 mg by mouth 2 (two) times daily.        . sertraline (ZOLOFT) 50 MG tablet Take 50 mg by mouth daily.        . simvastatin (ZOCOR) 40 MG tablet Take 40 mg by mouth every evening.      [name] aspirin EC 81 MG tablet Take 1 tablet (81 mg total) by mouth daily.  90 tablet  0  . Brimonidine Tartrate-Timolol (COMBIGAN OP) Apply to eye 2 (two) times daily.      . hydroxypropyl methylcellulose (ISOPTO TEARS) 2.5 % ophthalmic solution 1 drop as needed for dry eyes.      . Probiotic Product (PROBIOTIC COLON SUPPORT PO) Take by mouth daily.       No  current facility-administered medications for this visit.    BP 144/78  Pulse 62  Ht 5\' 7"  (1.702 m)  Wt 78.019 kg (172 lb)  BMI 26.93 kg/m2 General: NAD Neck: No JVD, no thyromegaly or thyroid nodule.  Lungs: Clear to auscultation bilaterally with normal respiratory effort. CV: Nondisplaced PMI.  Heart regular S1/S2, no S3/S4, no murmur.  No peripheral edema.  No carotid bruit.  Normal pedal pulses.  Abdomen: Soft, nontender, no hepatosplenomegaly, no distention.  Skin: Intact without lesions or rashes.  Neurologic: Alert and oriented x 3.  Psych: Normal affect. Extremities: No clubbing or cyanosis.   Assessment/Plan: 1. CAD: Stable s/p CABG.  No ischemic symptoms.  Continue Coreg and simvastatin. She is now off warfarin so I will have her start ASA 81 mg daily. 2. PE: PE in 2/14.  On initial hypercoagulable workup, lupus anticoagulant  was positive, raising concern for antiphospholipid antibody syndrome.  However, repeat antiphospholipid antibody panels in 8/14 and 1/15 were negative and her oncologist let her stop warfarin earlier this week 3. Carotid stenosis: Repeat carotid dopplers in 8/15.  4. Hyperlipidemia: Check lipids today.    Followup 1 year.   Larey Dresser 05/06/2013

## 2013-05-09 ENCOUNTER — Other Ambulatory Visit (INDEPENDENT_AMBULATORY_CARE_PROVIDER_SITE_OTHER): Payer: Medicare Other

## 2013-05-09 DIAGNOSIS — E7800 Pure hypercholesterolemia, unspecified: Secondary | ICD-10-CM

## 2013-05-09 LAB — LIPID PANEL
CHOL/HDL RATIO: 4
Cholesterol: 109 mg/dL (ref 0–200)
HDL: 26.5 mg/dL — ABNORMAL LOW (ref >39.00)
LDL CALC: 42 mg/dL (ref 0–99)
Triglycerides: 204 mg/dL — ABNORMAL HIGH (ref 0.0–149.0)
VLDL: 40.8 mg/dL — ABNORMAL HIGH (ref 0.0–40.0)

## 2013-05-10 DIAGNOSIS — Z961 Presence of intraocular lens: Secondary | ICD-10-CM | POA: Diagnosis not present

## 2013-05-10 DIAGNOSIS — H40059 Ocular hypertension, unspecified eye: Secondary | ICD-10-CM | POA: Diagnosis not present

## 2013-06-09 ENCOUNTER — Ambulatory Visit (INDEPENDENT_AMBULATORY_CARE_PROVIDER_SITE_OTHER): Payer: Medicare Other | Admitting: Ophthalmology

## 2013-06-09 DIAGNOSIS — E11319 Type 2 diabetes mellitus with unspecified diabetic retinopathy without macular edema: Secondary | ICD-10-CM | POA: Diagnosis not present

## 2013-06-09 DIAGNOSIS — E1139 Type 2 diabetes mellitus with other diabetic ophthalmic complication: Secondary | ICD-10-CM | POA: Diagnosis not present

## 2013-06-09 DIAGNOSIS — D3130 Benign neoplasm of unspecified choroid: Secondary | ICD-10-CM

## 2013-06-09 DIAGNOSIS — E11359 Type 2 diabetes mellitus with proliferative diabetic retinopathy without macular edema: Secondary | ICD-10-CM

## 2013-06-09 DIAGNOSIS — E1165 Type 2 diabetes mellitus with hyperglycemia: Secondary | ICD-10-CM | POA: Diagnosis not present

## 2013-06-09 DIAGNOSIS — I10 Essential (primary) hypertension: Secondary | ICD-10-CM

## 2013-06-09 DIAGNOSIS — H35039 Hypertensive retinopathy, unspecified eye: Secondary | ICD-10-CM

## 2013-06-09 DIAGNOSIS — H33009 Unspecified retinal detachment with retinal break, unspecified eye: Secondary | ICD-10-CM

## 2013-07-05 ENCOUNTER — Encounter (INDEPENDENT_AMBULATORY_CARE_PROVIDER_SITE_OTHER): Payer: Self-pay | Admitting: Surgery

## 2013-07-05 DIAGNOSIS — R109 Unspecified abdominal pain: Secondary | ICD-10-CM | POA: Diagnosis not present

## 2013-07-12 DIAGNOSIS — E119 Type 2 diabetes mellitus without complications: Secondary | ICD-10-CM | POA: Diagnosis not present

## 2013-07-12 DIAGNOSIS — Z23 Encounter for immunization: Secondary | ICD-10-CM | POA: Diagnosis not present

## 2013-07-12 DIAGNOSIS — E7800 Pure hypercholesterolemia, unspecified: Secondary | ICD-10-CM | POA: Diagnosis not present

## 2013-07-12 DIAGNOSIS — I2510 Atherosclerotic heart disease of native coronary artery without angina pectoris: Secondary | ICD-10-CM | POA: Diagnosis not present

## 2013-07-12 DIAGNOSIS — Z0000 Encounter for general adult medical examination without abnormal findings: Secondary | ICD-10-CM | POA: Diagnosis not present

## 2013-07-12 DIAGNOSIS — F329 Major depressive disorder, single episode, unspecified: Secondary | ICD-10-CM | POA: Diagnosis not present

## 2013-07-12 DIAGNOSIS — F411 Generalized anxiety disorder: Secondary | ICD-10-CM | POA: Diagnosis not present

## 2013-07-12 DIAGNOSIS — F3289 Other specified depressive episodes: Secondary | ICD-10-CM | POA: Diagnosis not present

## 2013-08-08 ENCOUNTER — Other Ambulatory Visit (HOSPITAL_COMMUNITY): Payer: Self-pay | Admitting: Family Medicine

## 2013-08-08 DIAGNOSIS — Z1231 Encounter for screening mammogram for malignant neoplasm of breast: Secondary | ICD-10-CM

## 2013-08-09 DIAGNOSIS — Z961 Presence of intraocular lens: Secondary | ICD-10-CM | POA: Diagnosis not present

## 2013-08-09 DIAGNOSIS — H40059 Ocular hypertension, unspecified eye: Secondary | ICD-10-CM | POA: Diagnosis not present

## 2013-08-16 ENCOUNTER — Telehealth: Payer: Self-pay | Admitting: Oncology

## 2013-08-16 NOTE — Telephone Encounter (Signed)
, °

## 2013-08-17 DIAGNOSIS — E7800 Pure hypercholesterolemia, unspecified: Secondary | ICD-10-CM | POA: Diagnosis not present

## 2013-08-17 DIAGNOSIS — F329 Major depressive disorder, single episode, unspecified: Secondary | ICD-10-CM | POA: Diagnosis not present

## 2013-08-17 DIAGNOSIS — F3289 Other specified depressive episodes: Secondary | ICD-10-CM | POA: Diagnosis not present

## 2013-08-17 DIAGNOSIS — H6120 Impacted cerumen, unspecified ear: Secondary | ICD-10-CM | POA: Diagnosis not present

## 2013-08-18 ENCOUNTER — Telehealth: Payer: Self-pay | Admitting: Oncology

## 2013-09-01 ENCOUNTER — Ambulatory Visit: Payer: 59 | Admitting: Oncology

## 2013-09-01 ENCOUNTER — Other Ambulatory Visit: Payer: 59

## 2013-09-04 ENCOUNTER — Ambulatory Visit (HOSPITAL_COMMUNITY)
Admission: RE | Admit: 2013-09-04 | Discharge: 2013-09-04 | Disposition: A | Discharge status: Home / Self Care | Payer: Medicare Other | Source: Ambulatory Visit | Attending: Family Medicine | Admitting: Family Medicine

## 2013-09-04 ENCOUNTER — Other Ambulatory Visit (HOSPITAL_COMMUNITY): Payer: Self-pay | Admitting: Family Medicine

## 2013-09-04 DIAGNOSIS — Z1231 Encounter for screening mammogram for malignant neoplasm of breast: Secondary | ICD-10-CM

## 2013-09-14 ENCOUNTER — Other Ambulatory Visit (HOSPITAL_COMMUNITY): Payer: Self-pay | Admitting: Cardiology

## 2013-09-14 DIAGNOSIS — I6529 Occlusion and stenosis of unspecified carotid artery: Secondary | ICD-10-CM

## 2013-09-21 ENCOUNTER — Encounter (HOSPITAL_COMMUNITY): Payer: Medicare Other

## 2013-09-21 ENCOUNTER — Ambulatory Visit (INDEPENDENT_AMBULATORY_CARE_PROVIDER_SITE_OTHER): Payer: Medicare Other | Admitting: Surgery

## 2013-09-27 ENCOUNTER — Ambulatory Visit (HOSPITAL_COMMUNITY): Payer: Medicare Other | Attending: Cardiology | Admitting: Cardiology

## 2013-09-27 DIAGNOSIS — I6529 Occlusion and stenosis of unspecified carotid artery: Secondary | ICD-10-CM | POA: Diagnosis not present

## 2013-09-27 DIAGNOSIS — Z87891 Personal history of nicotine dependence: Secondary | ICD-10-CM | POA: Insufficient documentation

## 2013-09-27 DIAGNOSIS — I2510 Atherosclerotic heart disease of native coronary artery without angina pectoris: Secondary | ICD-10-CM | POA: Insufficient documentation

## 2013-09-27 DIAGNOSIS — Z951 Presence of aortocoronary bypass graft: Secondary | ICD-10-CM | POA: Insufficient documentation

## 2013-09-27 DIAGNOSIS — I10 Essential (primary) hypertension: Secondary | ICD-10-CM | POA: Insufficient documentation

## 2013-09-27 DIAGNOSIS — E785 Hyperlipidemia, unspecified: Secondary | ICD-10-CM | POA: Diagnosis not present

## 2013-09-27 DIAGNOSIS — I739 Peripheral vascular disease, unspecified: Secondary | ICD-10-CM | POA: Insufficient documentation

## 2013-09-27 NOTE — Progress Notes (Signed)
Carotid duplex performed 

## 2013-10-18 DIAGNOSIS — Z08 Encounter for follow-up examination after completed treatment for malignant neoplasm: Secondary | ICD-10-CM | POA: Diagnosis not present

## 2013-10-18 DIAGNOSIS — D0471 Carcinoma in situ of skin of right lower limb, including hip: Secondary | ICD-10-CM | POA: Diagnosis not present

## 2013-10-18 DIAGNOSIS — Z85828 Personal history of other malignant neoplasm of skin: Secondary | ICD-10-CM | POA: Diagnosis not present

## 2013-10-18 DIAGNOSIS — X32XXXD Exposure to sunlight, subsequent encounter: Secondary | ICD-10-CM | POA: Diagnosis not present

## 2013-10-18 DIAGNOSIS — L570 Actinic keratosis: Secondary | ICD-10-CM | POA: Diagnosis not present

## 2013-10-20 ENCOUNTER — Ambulatory Visit: Payer: Self-pay | Admitting: Cardiology

## 2013-10-20 DIAGNOSIS — I2699 Other pulmonary embolism without acute cor pulmonale: Secondary | ICD-10-CM

## 2013-11-09 DIAGNOSIS — H40053 Ocular hypertension, bilateral: Secondary | ICD-10-CM | POA: Diagnosis not present

## 2013-11-09 DIAGNOSIS — Z961 Presence of intraocular lens: Secondary | ICD-10-CM | POA: Diagnosis not present

## 2013-12-06 DIAGNOSIS — Z85828 Personal history of other malignant neoplasm of skin: Secondary | ICD-10-CM | POA: Diagnosis not present

## 2013-12-06 DIAGNOSIS — Z08 Encounter for follow-up examination after completed treatment for malignant neoplasm: Secondary | ICD-10-CM | POA: Diagnosis not present

## 2013-12-06 DIAGNOSIS — L820 Inflamed seborrheic keratosis: Secondary | ICD-10-CM | POA: Diagnosis not present

## 2013-12-10 ENCOUNTER — Other Ambulatory Visit: Payer: Self-pay | Admitting: Oncology

## 2013-12-10 DIAGNOSIS — Z853 Personal history of malignant neoplasm of breast: Secondary | ICD-10-CM

## 2013-12-14 ENCOUNTER — Other Ambulatory Visit: Payer: Medicare Other

## 2013-12-14 ENCOUNTER — Ambulatory Visit: Payer: Medicare Other | Admitting: Oncology

## 2013-12-18 ENCOUNTER — Other Ambulatory Visit (HOSPITAL_BASED_OUTPATIENT_CLINIC_OR_DEPARTMENT_OTHER): Payer: Medicare Other

## 2013-12-18 ENCOUNTER — Ambulatory Visit (HOSPITAL_BASED_OUTPATIENT_CLINIC_OR_DEPARTMENT_OTHER): Payer: Medicare Other | Admitting: Oncology

## 2013-12-18 ENCOUNTER — Encounter: Payer: Self-pay | Admitting: Oncology

## 2013-12-18 ENCOUNTER — Telehealth: Payer: Self-pay | Admitting: Oncology

## 2013-12-18 VITALS — BP 148/75 | HR 75 | Temp 97.8°F | Resp 18 | Ht 67.0 in | Wt 162.7 lb

## 2013-12-18 DIAGNOSIS — Z86711 Personal history of pulmonary embolism: Secondary | ICD-10-CM | POA: Diagnosis not present

## 2013-12-18 DIAGNOSIS — Z87891 Personal history of nicotine dependence: Secondary | ICD-10-CM | POA: Diagnosis not present

## 2013-12-18 DIAGNOSIS — D0510 Intraductal carcinoma in situ of unspecified breast: Secondary | ICD-10-CM | POA: Diagnosis not present

## 2013-12-18 DIAGNOSIS — I2699 Other pulmonary embolism without acute cor pulmonale: Secondary | ICD-10-CM | POA: Diagnosis not present

## 2013-12-18 DIAGNOSIS — Z86000 Personal history of in-situ neoplasm of breast: Secondary | ICD-10-CM

## 2013-12-18 DIAGNOSIS — M79605 Pain in left leg: Secondary | ICD-10-CM

## 2013-12-18 DIAGNOSIS — Z853 Personal history of malignant neoplasm of breast: Secondary | ICD-10-CM

## 2013-12-18 DIAGNOSIS — I8392 Asymptomatic varicose veins of left lower extremity: Secondary | ICD-10-CM

## 2013-12-18 DIAGNOSIS — Z1231 Encounter for screening mammogram for malignant neoplasm of breast: Secondary | ICD-10-CM

## 2013-12-18 LAB — CBC WITH DIFFERENTIAL/PLATELET
BASO%: 0.9 % (ref 0.0–2.0)
BASOS ABS: 0.1 10*3/uL (ref 0.0–0.1)
EOS ABS: 0.2 10*3/uL (ref 0.0–0.5)
EOS%: 1.9 % (ref 0.0–7.0)
HCT: 42.4 % (ref 34.8–46.6)
HEMOGLOBIN: 13.7 g/dL (ref 11.6–15.9)
LYMPH#: 2.3 10*3/uL (ref 0.9–3.3)
LYMPH%: 27 % (ref 14.0–49.7)
MCH: 29.5 pg (ref 25.1–34.0)
MCHC: 32.3 g/dL (ref 31.5–36.0)
MCV: 91.2 fL (ref 79.5–101.0)
MONO#: 0.4 10*3/uL (ref 0.1–0.9)
MONO%: 4.8 % (ref 0.0–14.0)
NEUT%: 65.4 % (ref 38.4–76.8)
NEUTROS ABS: 5.5 10*3/uL (ref 1.5–6.5)
Platelets: 272 10*3/uL (ref 145–400)
RBC: 4.65 10*6/uL (ref 3.70–5.45)
RDW: 12.8 % (ref 11.2–14.5)
WBC: 8.5 10*3/uL (ref 3.9–10.3)

## 2013-12-18 LAB — COMPREHENSIVE METABOLIC PANEL (CC13)
ALBUMIN: 4.1 g/dL (ref 3.5–5.0)
ALK PHOS: 49 U/L (ref 40–150)
ALT: 12 U/L (ref 0–55)
AST: 14 U/L (ref 5–34)
Anion Gap: 10 mEq/L (ref 3–11)
BUN: 13.3 mg/dL (ref 7.0–26.0)
CALCIUM: 9.8 mg/dL (ref 8.4–10.4)
CHLORIDE: 105 meq/L (ref 98–109)
CO2: 26 mEq/L (ref 22–29)
Creatinine: 1 mg/dL (ref 0.6–1.1)
EGFR: 62 mL/min/{1.73_m2} — AB (ref >90)
GLUCOSE: 128 mg/dL (ref 70–140)
POTASSIUM: 4.1 meq/L (ref 3.5–5.1)
SODIUM: 140 meq/L (ref 136–145)
TOTAL PROTEIN: 7.3 g/dL (ref 6.4–8.3)
Total Bilirubin: 0.51 mg/dL (ref 0.20–1.20)

## 2013-12-18 NOTE — Progress Notes (Signed)
OFFICE PROGRESS NOTE   12/18/2013   Physicians: Duane Lope (PCP), Marca Ancona, Ovidio Kin, Cherene Altes, [place].Leda Quail, T.Stuckey)  INTERVAL HISTORY:  Patient is a very pleasant 68 yo lady whom I am seeing for the first time today, previously followed for DCIS left breast by Dr Welton Flakes. She is post left mastectomy and did not want preventative hormonal blocker. [facility] with no mammographic findings of concern. She is still followed by Dr Ezzard Standing, tho may have missed last appointment there.  Patient also has history of bilateral PEs in 02-2012, with left greater saphenous vein clot documented. She was anticoagulated with coumadin thru 04-2013, when this was [facility] after at least 2 unremarkable hypercoagulable workups.   Patient is not aware of any changes in right breast or at left mastectomy. She has had soreness inner left thigh and calf with walking for at least 5 months, improves when she sits or lies. She reports minimal swelling LLE with this. She denies SOB or chest pain. She does have varicose veins, has not liked support stockings.  She does not have central catheter. She declines flu vaccine.   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 She declined hormonal blocker and has been followed with observation.   Review of systems as above, also: Patient reports feeling tired most days, tho she sleeps well and has had thyroid checked by PCP;  good appetite but has tried to improve diet and is having intentional weight loss.. No recent infectious illness. No bleeding. No respiratory, GI or other musculoskeletal symptoms.   Remainder of 10 point Review of Systems negative.  Social History: from [place], lives with husband, family includes son and 68 yo grandson. Tobacco DCd 2006.   Past Medical History  additionally Elevated lipids Type 2 DM HTN GERD CAD Carotid stenosis Glaucoma, retinal detatchment  Family History Parents MIs Sister CABG age 31  Objective:  Vital signs in last 24 hours:  BP 148/75 mmHg  Pulse 75  Temp(Src) 97.8 F (36.6 C) (Oral)  Resp 18  Ht 5\' 7"  (1.702 m)  Wt 162 lb 11.2 oz (73.8 kg)  BMI 25.48 kg/m2  SpO2 99% Weight is down 16 lbs from 02-2012. Pleasant lady, looks stated age, good historian. Alert, oriented and appropriate. Ambulatory without difficulty. Respirations not labored RA.   HEENT:PERRL, sclerae not icteric. Oral mucosa moist without lesions, posterior pharynx clear.  Neck supple. No JVD. No apparent thyroid mass. Lymphatics:no cervical,supraclavicular, axillary or inguinal adenopathy Resp: clear to auscultation bilaterally and normal percussion bilaterally Cardio: regular rate and rhythm. No gallop. GI: abdomen soft, nontender, not distended, no mass or organomegaly. Normally active bowel sounds.  Musculoskeletal/ Extremities: without pitting edema, cords including LLE. Prominent superficial varicosities L>R especially medial leg in area of the soreness with ambulation. Back not tender.  Neuro: no peripheral neuropathy. Otherwise nonfocal. PSYCH appropriate mood and affect Skin without rash, ecchymosis, petechiae Breasts: Left mastectomy scar well healed without evidence of local recurrence. Right breast without dominant mass, skin or nipple findings. Axillae benign.  Lab Results:  Results for orders placed or performed in visit on 12/18/13  CBC with Differential  Result Value Ref Range   WBC 8.5 3.9 - 10.3 10e3/uL   NEUT# 5.5 1.5 - 6.5 10e3/uL   HGB 13.7 11.6 - 15.9 g/dL   HCT 14/07/15 71.6 -  46.6 %   Platelets 272 145 - 400 10e3/uL   MCV 91.2 79.5 - 101.0 fL   MCH 29.5 25.1 - 34.0 pg   MCHC 32.3 31.5 - 36.0 g/dL   RBC 4.65 3.70 - 5.45 10e6/uL   RDW 12.8 11.2 - 14.5 %   lymph# 2.3 0.9 - 3.3 10e3/uL   MONO# 0.4 0.1 - 0.9 10e3/uL    Eosinophils Absolute 0.2 0.0 - 0.5 10e3/uL   Basophils Absolute 0.1 0.0 - 0.1 10e3/uL   NEUT% 65.4 38.4 - 76.8 %   LYMPH% 27.0 14.0 - 49.7 %   MONO% 4.8 0.0 - 14.0 %   EOS% 1.9 0.0 - 7.0 %   BASO% 0.9 0.0 - 2.0 %  Comprehensive metabolic panel (Cmet) - [facility]  Result Value Ref Range   Sodium 140 136 - 145 mEq/L   Potassium 4.1 3.5 - 5.1 mEq/L   Chloride 105 98 - 109 mEq/L   CO2 26 22 - 29 mEq/L   Glucose 128 70 - 140 mg/dl   BUN 13.3 7.0 - 26.0 mg/dL   Creatinine 1.0 0.6 - 1.1 mg/dL   Total Bilirubin 0.51 0.20 - 1.20 mg/dL   Alkaline Phosphatase 49 40 - 150 U/L   AST 14 5 - 34 U/L   ALT 12 0 - 55 U/L   Total Protein 7.3 6.4 - 8.3 g/dL   Albumin 4.1 3.5 - 5.0 g/dL   Calcium 9.8 8.4 - 10.4 mg/dL   Anion Gap 10 3 - 11 mEq/L   EGFR 62 (L) >90 ml/min/1.73 m2     Studies/Results: Pathology from [facility] [address] [place], [place] [address] Telephone [phone] or [phone] Fax [phone]  REPORT OF SURGICAL PATHOLOGY  Case #: [id number] Patient Name: ANNALISA, COLONNA Office Chart Number: [id number]  MRN: [id number] Pathologist: Lyndon Code M.D., Vonna Kotyk DOB/Age 05/09/1945 (Age: 45) Gender: F Date Taken: 03/21/2009 Date Received: 03/21/2009  FINAL DIAGNOSIS Microscopic Examination and Diagnosis  1. LYMPH NODE, SENTINEL, BIOPSY, LEFT : - THERE IS NO EVIDENCE OF CARCINOMA IN 1 OF 1 LYMPH NODE (0/1). - SEE COMMENT. 2. LYMPH NODES, REGIONAL RESECTION, AXILLARY CONTENTS : - UNREMARKABLE FIBROADIPOSE TISSUE, NERVES, AND VESSELS. - LYMPH NODAL TISSUE IS NOT IDENTIFIED. - THERE IS NO EVIDENCE OF MALIGNANCY. 3. BREAST, SIMPLE MASTECTOMY, LEFT : - SCATTERED FOCI OF HIGH GRADE DUCTAL CARCINOMA IN SITU. - INTRADUCTAL PAPILLOMA WITH USUAL DUCTAL HYPERPLASIA. - RADIAL SCAR(S). - THE SURGICAL MARGINS APPEAR NEGATIVE FOR CARCINOMA     REPORT OF SURGICAL PATHOLOGY  Case #:  [id number] Patient Name: KALLEN, DELATORRE Office Chart Number: [id number]  MRN: [id number] Pathologist: Leatrice Jewels DOB/Age [birth year] (Age: 89) Gender: F Date Taken: 02/12/2009 Date Received: 02/12/2009  FINAL DIAGNOSIS Microscopic Examination and Diagnosis  1. BREAST, LEFT, NEEDLE CORE BIOPSY, CENTRAL : - HIGH GRADE DUCTAL CARCINOMA IN SITU WITH FOCUS SUSPICIOUS FOR BUT NOT DIAGNOSTIC OF EARLY STROMAL INVASION.   Estrogen receptor (negative, <1%): 99%, positive Progesterone receptor (negative, <1%): 52%, positive     [facility] UNILATERAL RIGHT MAMMOGRAM WITH CAD  COMPARISON: Previous exam(s).  ACR Breast Density Category b: There are scattered areas of fibroglandular density.  FINDINGS: There are no findings suspicious for malignancy. Images were processed with CAD.  IMPRESSION: No mammographic evidence of malignancy. A result letter of this screening mammogram will be mailed directly to the patient.  RECOMMENDATION: Screening mammogram in one year. (Code:[id number]-B-01Y)  BI-RADS CATEGORY 1: Negative.        Medications: I have reviewed the patient's current medications.  DISCUSSION: Doubt LE venous thrombosis, but with history will get dopplers. If no clot, seems likely that the left symptoms are related to varicose veins and have recommended trying support stockings if so. Suggested alternating visits at this office and with Dr Lucia Gaskins every 6 months. Needs right mammogram again in 08-2014.  Assessment/Plan: 1. DCIS left breast: ER PR +, post left mastectomy, on observation. Follow up and mammogram as above. Hopefully Dr Lucia Gaskins will see her in ~ 06-2014 and I will see her again in a year, or sooner if needed. 2.intentional weight loss 3. CAD and peripheral vascular disease, HTN, elevated lipids. 4. PEs 02-2012 and superficial LE thrombosis then. Hypercoagulable evaluations x at least 2 negative, off coumadin.  With left leg symptoms will get LE venous dopplers 5. Superficial varicosities LLE > RLE, which may be causing leg symptoms if no thrombosis 6.declined flu vaccine 7.DM 8.Past tobacco 9.intentional weight loss  All questions answered. Patient was comfortable with discussion and plans as above Cc this note to Drs Harrington Challenger and Lucia Gaskins Time spent 40 min including >50% counseling and coordination of care.  Marqueta Pulley P, MD   12/18/2013, 1:50 PM

## 2013-12-18 NOTE — Telephone Encounter (Signed)
, °

## 2013-12-19 ENCOUNTER — Telehealth: Payer: Self-pay | Admitting: *Deleted

## 2013-12-19 ENCOUNTER — Ambulatory Visit (HOSPITAL_COMMUNITY)
Admission: RE | Admit: 2013-12-19 | Discharge: 2013-12-19 | Disposition: A | Discharge status: Home / Self Care | Payer: Medicare Other | Source: Ambulatory Visit | Attending: Oncology | Admitting: Oncology

## 2013-12-19 DIAGNOSIS — M79605 Pain in left leg: Secondary | ICD-10-CM

## 2013-12-19 DIAGNOSIS — M79602 Pain in left arm: Secondary | ICD-10-CM

## 2013-12-19 DIAGNOSIS — Z853 Personal history of malignant neoplasm of breast: Secondary | ICD-10-CM

## 2013-12-19 NOTE — Progress Notes (Signed)
Left lower extremity venous duplex completed.  Left:  No evidence of DVT, superficial thrombosis, or Baker's cyst.  Right:  Negative for DVT in the common femoral vein.  

## 2013-12-19 NOTE — Telephone Encounter (Signed)
Received VM from [facility] in vascular lab that patient's dopplers were negative and she notified pt and discharged her home.

## 2013-12-20 ENCOUNTER — Telehealth: Payer: Self-pay

## 2013-12-20 NOTE — Telephone Encounter (Signed)
Told Ms. Mcneel the result of the doppler study as noted below by Dr. Marko Plume.   Ms. Wunschel said that the technician did give her the results of the study.

## 2013-12-20 NOTE — Telephone Encounter (Signed)
-----   Message from Gordy Levan, MD sent at 12/20/2013 11:34 AM EST ----- Labs seen and need follow up: please let her know, if tech did not tell her, that she did not have another blood clot on these dopplers from 12-8

## 2013-12-20 NOTE — Telephone Encounter (Signed)
Left message for Taylor [name] to call bak to discuss results of doppler study.

## 2013-12-21 ENCOUNTER — Encounter: Payer: Self-pay | Admitting: Oncology

## 2013-12-21 DIAGNOSIS — Z87891 Personal history of nicotine dependence: Secondary | ICD-10-CM | POA: Insufficient documentation

## 2013-12-21 DIAGNOSIS — Z23 Encounter for immunization: Secondary | ICD-10-CM | POA: Diagnosis not present

## 2014-02-21 DIAGNOSIS — L089 Local infection of the skin and subcutaneous tissue, unspecified: Secondary | ICD-10-CM | POA: Diagnosis not present

## 2014-02-21 DIAGNOSIS — F419 Anxiety disorder, unspecified: Secondary | ICD-10-CM | POA: Diagnosis not present

## 2014-03-02 ENCOUNTER — Telehealth: Payer: Self-pay | Admitting: Oncology

## 2014-03-02 NOTE — Telephone Encounter (Signed)
, °

## 2014-03-14 DIAGNOSIS — Z961 Presence of intraocular lens: Secondary | ICD-10-CM | POA: Diagnosis not present

## 2014-03-14 DIAGNOSIS — H40053 Ocular hypertension, bilateral: Secondary | ICD-10-CM | POA: Diagnosis not present

## 2014-03-16 ENCOUNTER — Ambulatory Visit (INDEPENDENT_AMBULATORY_CARE_PROVIDER_SITE_OTHER): Payer: Medicare Other | Admitting: Ophthalmology

## 2014-03-16 DIAGNOSIS — E11359 Type 2 diabetes mellitus with proliferative diabetic retinopathy without macular edema: Secondary | ICD-10-CM | POA: Diagnosis not present

## 2014-03-16 DIAGNOSIS — E11319 Type 2 diabetes mellitus with unspecified diabetic retinopathy without macular edema: Secondary | ICD-10-CM

## 2014-03-16 DIAGNOSIS — D3132 Benign neoplasm of left choroid: Secondary | ICD-10-CM | POA: Diagnosis not present

## 2014-03-16 DIAGNOSIS — I10 Essential (primary) hypertension: Secondary | ICD-10-CM

## 2014-03-16 DIAGNOSIS — H35033 Hypertensive retinopathy, bilateral: Secondary | ICD-10-CM

## 2014-03-16 DIAGNOSIS — H43813 Vitreous degeneration, bilateral: Secondary | ICD-10-CM | POA: Diagnosis not present

## 2014-03-16 DIAGNOSIS — E11329 Type 2 diabetes mellitus with mild nonproliferative diabetic retinopathy without macular edema: Secondary | ICD-10-CM | POA: Diagnosis not present

## 2014-04-11 ENCOUNTER — Other Ambulatory Visit: Payer: Self-pay | Admitting: Family Medicine

## 2014-04-11 DIAGNOSIS — R102 Pelvic and perineal pain: Secondary | ICD-10-CM | POA: Diagnosis not present

## 2014-04-11 DIAGNOSIS — K5900 Constipation, unspecified: Secondary | ICD-10-CM | POA: Diagnosis not present

## 2014-04-12 ENCOUNTER — Emergency Department (HOSPITAL_COMMUNITY)
Admission: EM | Admit: 2014-04-12 | Discharge: 2014-04-12 | Disposition: A | Discharge status: Home / Self Care | Payer: Medicare Other | Attending: Emergency Medicine | Admitting: Emergency Medicine

## 2014-04-12 ENCOUNTER — Emergency Department (HOSPITAL_COMMUNITY): Payer: Medicare Other

## 2014-04-12 ENCOUNTER — Encounter (HOSPITAL_COMMUNITY): Payer: Self-pay | Admitting: *Deleted

## 2014-04-12 DIAGNOSIS — E780 Pure hypercholesterolemia: Secondary | ICD-10-CM | POA: Diagnosis not present

## 2014-04-12 DIAGNOSIS — Z7982 Long term (current) use of aspirin: Secondary | ICD-10-CM | POA: Diagnosis not present

## 2014-04-12 DIAGNOSIS — I2510 Atherosclerotic heart disease of native coronary artery without angina pectoris: Secondary | ICD-10-CM | POA: Diagnosis not present

## 2014-04-12 DIAGNOSIS — Z79899 Other long term (current) drug therapy: Secondary | ICD-10-CM | POA: Insufficient documentation

## 2014-04-12 DIAGNOSIS — F329 Major depressive disorder, single episode, unspecified: Secondary | ICD-10-CM | POA: Insufficient documentation

## 2014-04-12 DIAGNOSIS — M25552 Pain in left hip: Secondary | ICD-10-CM | POA: Diagnosis not present

## 2014-04-12 DIAGNOSIS — I10 Essential (primary) hypertension: Secondary | ICD-10-CM | POA: Insufficient documentation

## 2014-04-12 DIAGNOSIS — Z87891 Personal history of nicotine dependence: Secondary | ICD-10-CM | POA: Diagnosis not present

## 2014-04-12 DIAGNOSIS — Z86711 Personal history of pulmonary embolism: Secondary | ICD-10-CM | POA: Insufficient documentation

## 2014-04-12 DIAGNOSIS — Z791 Long term (current) use of non-steroidal anti-inflammatories (NSAID): Secondary | ICD-10-CM | POA: Diagnosis not present

## 2014-04-12 DIAGNOSIS — Z8719 Personal history of other diseases of the digestive system: Secondary | ICD-10-CM | POA: Insufficient documentation

## 2014-04-12 DIAGNOSIS — Z951 Presence of aortocoronary bypass graft: Secondary | ICD-10-CM | POA: Insufficient documentation

## 2014-04-12 DIAGNOSIS — Z853 Personal history of malignant neoplasm of breast: Secondary | ICD-10-CM | POA: Insufficient documentation

## 2014-04-12 DIAGNOSIS — I252 Old myocardial infarction: Secondary | ICD-10-CM | POA: Diagnosis not present

## 2014-04-12 DIAGNOSIS — E119 Type 2 diabetes mellitus without complications: Secondary | ICD-10-CM | POA: Insufficient documentation

## 2014-04-12 DIAGNOSIS — R1032 Left lower quadrant pain: Secondary | ICD-10-CM | POA: Diagnosis present

## 2014-04-12 LAB — URINALYSIS, ROUTINE W REFLEX MICROSCOPIC
GLUCOSE, UA: NEGATIVE mg/dL
Hgb urine dipstick: NEGATIVE
Ketones, ur: 15 mg/dL — AB
LEUKOCYTES UA: NEGATIVE
Nitrite: NEGATIVE
PH: 5 (ref 5.0–8.0)
Protein, ur: NEGATIVE mg/dL
Specific Gravity, Urine: 1.025 (ref 1.005–1.030)
Urobilinogen, UA: 1 mg/dL (ref 0.0–1.0)

## 2014-04-12 LAB — CBC WITH DIFFERENTIAL/PLATELET
Basophils Absolute: 0 10*3/uL (ref 0.0–0.1)
Basophils Relative: 0 % (ref 0–1)
Eosinophils Absolute: 0.2 10*3/uL (ref 0.0–0.7)
Eosinophils Relative: 2 % (ref 0–5)
HCT: 42.4 % (ref 36.0–46.0)
HEMOGLOBIN: 14.2 g/dL (ref 12.0–15.0)
LYMPHS ABS: 2.5 10*3/uL (ref 0.7–4.0)
LYMPHS PCT: 32 % (ref 12–46)
MCH: 30.2 pg (ref 26.0–34.0)
MCHC: 33.5 g/dL (ref 30.0–36.0)
MCV: 90.2 fL (ref 78.0–100.0)
MONOS PCT: 4 % (ref 3–12)
Monocytes Absolute: 0.3 10*3/uL (ref 0.1–1.0)
NEUTROS ABS: 4.8 10*3/uL (ref 1.7–7.7)
NEUTROS PCT: 62 % (ref 43–77)
PLATELETS: 309 10*3/uL (ref 150–400)
RBC: 4.7 MIL/uL (ref 3.87–5.11)
RDW: 12.7 % (ref 11.5–15.5)
WBC: 7.8 10*3/uL (ref 4.0–10.5)

## 2014-04-12 LAB — COMPREHENSIVE METABOLIC PANEL
ALT: 14 U/L (ref 0–35)
AST: 20 U/L (ref 0–37)
Albumin: 4.1 g/dL (ref 3.5–5.2)
Alkaline Phosphatase: 54 U/L (ref 39–117)
Anion gap: 12 (ref 5–15)
BUN: 12 mg/dL (ref 6–23)
CO2: 24 mmol/L (ref 19–32)
CREATININE: 1.04 mg/dL (ref 0.50–1.10)
Calcium: 9.6 mg/dL (ref 8.4–10.5)
Chloride: 103 mmol/L (ref 96–112)
GFR calc Af Amer: 63 mL/min — ABNORMAL LOW (ref >90)
GFR calc non Af Amer: 54 mL/min — ABNORMAL LOW (ref >90)
Glucose, Bld: 97 mg/dL (ref 70–99)
Potassium: 4.2 mmol/L (ref 3.5–5.1)
Sodium: 139 mmol/L (ref 135–145)
Total Bilirubin: 0.6 mg/dL (ref 0.3–1.2)
Total Protein: 7.2 g/dL (ref 6.0–8.3)

## 2014-04-12 LAB — PROTIME-INR
INR: 0.95 (ref 0.00–1.49)
Prothrombin Time: 12.8 seconds (ref 11.6–15.2)

## 2014-04-12 MED ORDER — HYDROCODONE-ACETAMINOPHEN 5-325 MG PO TABS: 1.0000 | ORAL_TABLET | ORAL | Status: DC | PRN

## 2014-04-12 NOTE — ED Notes (Signed)
Ultrasound tech at bedside

## 2014-04-12 NOTE — ED Notes (Signed)
Pt in radiology 

## 2014-04-12 NOTE — ED Notes (Addendum)
Pt is being tx at [facility] for LLQ pain that increases with ambulation.  They scheduled her for a pelvic US, but pt cannot wait b/c the pain is so great when she ambulates that she cannot walk.  Denies changes in bowel or bladder habits.  Denies vaginal s/s.  Hx of blood clots.

## 2014-04-12 NOTE — ED Provider Notes (Signed)
CSN: [id number]     Arrival date & time 04/12/14  1531 History   First MD Initiated Contact with Patient 04/12/14 1614     Chief Complaint  Patient presents with  . Abdominal Pain     (Consider location/radiation/quality/duration/timing/severity/associated sxs/prior Treatment) HPI   69 y/o female w/ PMH CAD, HL, HTN, DM who comes in with Intermittent left groin pain when she moves her left leg.  The pain is really only when she moves her leg.  The pain is sharp, no pain at rest.  She can recall no injuries.  The pain has been going on for approx one week.  No dysuria, no fever, no abdominal pain, no constipation, no diarrhea.   She does note she has noticed a small amount of LLE swelling, no skin changes.  She is being followed for this by her pcp who is working it up as an outpatient but the pain has become so severe that she felt she needed to obtain work up sooner.   Past Medical History  Diagnosis Date  . Coronary artery disease   . Hypercholesteremia   . Hypertension   . Diabetes mellitus   . Reflux esophagitis   . Depression   . GERD (gastroesophageal reflux disease)   . Acute MI   . Cancer     breast  . Pulmonary embolism   . Hx of tobacco use, presenting hazards to health 12/21/2013   Past Surgical History  Procedure Laterality Date  . Coronary artery bypass graft      x4  . Ankle surgery      left  . Cholecystectomy    . Cataract extraction, bilateral    . Right renal detachment surgery    . Eye surgery      right  . Mastectomy     Family History  Problem Relation Age of Onset  . Coronary artery disease Mother 25    status post CABG   . Heart attack Father 37    deceased secondary to fatal  first myocardial infarction  . Coronary artery disease Sister 8    alive had bypass surgery  . Cancer Sister   . Deep vein thrombosis Cousin     Neice had after knee surgery   History  Substance Use Topics  . Smoking status: Former Smoker    Quit date:  01/13/2004  . Smokeless tobacco: Not on file  . Alcohol Use: No   OB History    No data available     Review of Systems  Constitutional: Negative for fever and chills.  HENT: Negative for nosebleeds.   Eyes: Negative for visual disturbance.  Respiratory: Negative for cough and shortness of breath.   Cardiovascular: Negative for chest pain.  Gastrointestinal: Negative for nausea, vomiting, abdominal pain, diarrhea and constipation.  Genitourinary: Negative for dysuria.       Left groin pain  Skin: Negative for rash.  Neurological: Negative for weakness.  All other systems reviewed and are negative.     Allergies  Niacin  Home Medications   Prior to Admission medications   Medication Sig Start Date End Date Taking? Authorizing Provider  acetaminophen (TYLENOL) 500 MG tablet Take 1,000 mg by mouth every 6 (six) hours as needed for pain.   Yes Historical Provider, MD  ALPRAZolam (XANAX) 0.25 MG tablet Take 0.25 mg by mouth 3 (three) times daily as needed. For anxiety   Yes Historical Provider, MD  aspirin EC 81 MG tablet Take 1 tablet (81  mg total) by mouth daily. 05/04/13  Yes Larey Dresser, MD  Brimonidine Tartrate-Timolol (COMBIGAN OP) Apply to eye 2 (two) times daily.   Yes Historical Provider, MD  brimonidine-timolol (COMBIGAN) 0.2-0.5 % ophthalmic solution Place 1 drop into both eyes every 12 (twelve) hours.   Yes Historical Provider, MD  carvedilol (COREG) 6.25 MG tablet Take 6.25 mg by mouth 2 (two) times daily with a meal.   Yes Historical Provider, MD  dorzolamide (TRUSOPT) 2 % ophthalmic solution Place 1 drop into the right eye 2 (two) times daily.   Yes Historical Provider, MD  glimepiride (AMARYL) 2 MG tablet Take 2 mg by mouth at bedtime.    Yes Historical Provider, MD  hydroxypropyl methylcellulose (ISOPTO TEARS) 2.5 % ophthalmic solution 1 drop as needed for dry eyes.   Yes Historical Provider, MD  metFORMIN (GLUCOPHAGE-XR) 500 MG 24 hr tablet Take 1,000 mg by  mouth 2 (two) times daily.     Yes Historical Provider, MD  Probiotic Product (PROBIOTIC COLON SUPPORT PO) Take by mouth daily.   Yes Historical Provider, MD  sertraline (ZOLOFT) 50 MG tablet Take 50 mg by mouth daily.     Yes Historical Provider, MD  simvastatin (ZOCOR) 40 MG tablet Take 40 mg by mouth every evening.   Yes Historical Provider, MD  ketorolac (ACULAR) 0.4 % SOLN 1 drop 3 (three) times daily.    Historical Provider, MD   BP 121/52 mmHg  Pulse 53  Temp(Src) 98.2 F (36.8 C) (Oral)  Resp 16  SpO2 97% Physical Exam  Constitutional: She is oriented to person, place, and time. No distress.  HENT:  Head: Normocephalic and atraumatic.  Eyes: EOM are normal. Pupils are equal, round, and reactive to light.  Neck: Normal range of motion. Neck supple.  Cardiovascular: Normal rate and intact distal pulses.   Pulmonary/Chest: No respiratory distress.  Abdominal: Soft. There is no tenderness. There is no rebound and no guarding.  No L inguinal hernia appreciated  Musculoskeletal:  Mild swelling of the LLE compared to the right.  Easily dopplerable dp pulse on the left.  Able to fully range the L hip.  Neurological: She is alert and oriented to person, place, and time.  Skin: No rash noted. She is not diaphoretic.  Psychiatric: She has a normal mood and affect.    ED Course  Procedures (including critical care time) Labs Review Labs Reviewed  COMPREHENSIVE METABOLIC PANEL - Abnormal; Notable for the following:    GFR calc non Af Amer 54 (*)    GFR calc Af Amer 63 (*)    All other components within normal limits  URINALYSIS, ROUTINE W REFLEX MICROSCOPIC - Abnormal; Notable for the following:    Color, Urine AMBER (*)    Bilirubin Urine SMALL (*)    Ketones, ur 15 (*)    All other components within normal limits  CBC WITH DIFFERENTIAL/PLATELET  PROTIME-INR    Imaging Review Dg Hip Unilat With Pelvis 2-3 Views Left  04/12/2014   CLINICAL DATA:  Pain along the anterior  aspect of the left hip for 4 days. No known injury  EXAM: LEFT HIP (WITH PELVIS) 2-3 VIEWS  COMPARISON:  None.  FINDINGS: There is no fracture, erosion, or focal bone lesion. Enthesopathic changes, especially to the iliac wings and symphysis pubis. Atherosclerotic calcifications in the pelvis and thighs.  IMPRESSION: No explanation for acute hip pain.   Electronically Signed   By: Monte Fantasia M.D.   On: 04/12/2014 18:12  EKG Interpretation None      MDM   Final diagnoses:  Left hip pain    69 y/o female w/ PMH CAD, HL, HTN, DM who comes in with Intermittent left groin pain when she moves her left leg.  The pain is really only when she moves her leg.  Exam as above, maybe mild swelling of the LLE compared to the right.  No skin changes.  AFVSS.  There is pain when I range the left leg but I am able to fully range it, doubt septic joint given no fevers. She has an easily dopplerable dp pulse on the left, doubt blood clot and the pain isn't on ambulation necessarily but with any movement of the leg.  Doubt claudication.  I could appreciate no hernias  On exam and she has no pain at rest.  Will obtain LLE duplex and an xray of the left hip.  Will obtain basic labs and UA.    LLE duplex neg, labs WNL, xray neg.  At this point, feel the most likely etiology is muscular pain.  Feel safe for d/c w/ continued work up by her pcp who is going to be getting a pelvic u/s as an OPT  I have discussed the results, Dx and Tx plan with the patient. They expressed understanding and agree with the plan and were told to return to ED with any worsening of condition or concern.    Disposition: Discharge  Condition: Good  Discharge Medication List as of 04/12/2014  7:16 PM    START taking these medications   Details  HYDROcodone-acetaminophen (NORCO/VICODIN) 5-325 MG per tablet Take 1 tablet by mouth every 4 (four) hours as needed., Starting 04/12/2014, Until Discontinued, Print        Follow  Up: Lona Kettle, MD [place] [address] [phone]      Pt seen in conjunction with Dr. Caroline Sauger, MD 04/13/14 [place], MD 04/24/14 1606

## 2014-04-12 NOTE — Progress Notes (Signed)
VASCULAR LAB PRELIMINARY  PRELIMINARY  PRELIMINARY  PRELIMINARY  Left lower extremity venous duplex completed.    Preliminary report:  Left:  No evidence of DVT, superficial thrombosis, or Baker's cyst.  Zoelle Markus, RVS 04/12/2014, 7:12 PM

## 2014-04-12 NOTE — Discharge Instructions (Signed)

## 2014-04-16 ENCOUNTER — Ambulatory Visit
Admission: RE | Admit: 2014-04-16 | Discharge: 2014-04-16 | Disposition: A | Discharge status: Home / Self Care | Payer: Medicare Other | Source: Ambulatory Visit | Attending: Family Medicine | Admitting: Family Medicine

## 2014-04-16 DIAGNOSIS — R102 Pelvic and perineal pain: Secondary | ICD-10-CM

## 2014-04-16 DIAGNOSIS — Z780 Asymptomatic menopausal state: Secondary | ICD-10-CM | POA: Diagnosis not present

## 2014-04-16 DIAGNOSIS — N9489 Other specified conditions associated with female genital organs and menstrual cycle: Secondary | ICD-10-CM | POA: Diagnosis not present

## 2014-05-04 ENCOUNTER — Ambulatory Visit: Payer: Medicare Other | Admitting: Cardiology

## 2014-05-07 ENCOUNTER — Observation Stay (HOSPITAL_COMMUNITY)
Admission: EM | Admit: 2014-05-07 | Discharge: 2014-05-08 | Disposition: A | Discharge status: Home / Self Care | Payer: Medicare Other | Attending: Internal Medicine | Admitting: Internal Medicine

## 2014-05-07 ENCOUNTER — Encounter (HOSPITAL_COMMUNITY): Payer: Self-pay

## 2014-05-07 DIAGNOSIS — E08319 Diabetes mellitus due to underlying condition with unspecified diabetic retinopathy without macular edema: Secondary | ICD-10-CM

## 2014-05-07 DIAGNOSIS — Z7982 Long term (current) use of aspirin: Secondary | ICD-10-CM | POA: Diagnosis not present

## 2014-05-07 DIAGNOSIS — T7840XA Allergy, unspecified, initial encounter: Principal | ICD-10-CM | POA: Insufficient documentation

## 2014-05-07 DIAGNOSIS — I10 Essential (primary) hypertension: Secondary | ICD-10-CM | POA: Diagnosis not present

## 2014-05-07 DIAGNOSIS — E119 Type 2 diabetes mellitus without complications: Secondary | ICD-10-CM | POA: Insufficient documentation

## 2014-05-07 DIAGNOSIS — E785 Hyperlipidemia, unspecified: Secondary | ICD-10-CM | POA: Diagnosis not present

## 2014-05-07 DIAGNOSIS — Z87891 Personal history of nicotine dependence: Secondary | ICD-10-CM | POA: Insufficient documentation

## 2014-05-07 DIAGNOSIS — L299 Pruritus, unspecified: Secondary | ICD-10-CM | POA: Diagnosis not present

## 2014-05-07 DIAGNOSIS — E7800 Pure hypercholesterolemia, unspecified: Secondary | ICD-10-CM

## 2014-05-07 DIAGNOSIS — R42 Dizziness and giddiness: Secondary | ICD-10-CM | POA: Insufficient documentation

## 2014-05-07 DIAGNOSIS — X58XXXA Exposure to other specified factors, initial encounter: Secondary | ICD-10-CM | POA: Diagnosis not present

## 2014-05-07 DIAGNOSIS — Z86718 Personal history of other venous thrombosis and embolism: Secondary | ICD-10-CM | POA: Insufficient documentation

## 2014-05-07 DIAGNOSIS — Z791 Long term (current) use of non-steroidal anti-inflammatories (NSAID): Secondary | ICD-10-CM | POA: Diagnosis not present

## 2014-05-07 DIAGNOSIS — I2510 Atherosclerotic heart disease of native coronary artery without angina pectoris: Secondary | ICD-10-CM | POA: Insufficient documentation

## 2014-05-07 DIAGNOSIS — Z951 Presence of aortocoronary bypass graft: Secondary | ICD-10-CM | POA: Insufficient documentation

## 2014-05-07 DIAGNOSIS — R110 Nausea: Secondary | ICD-10-CM | POA: Diagnosis not present

## 2014-05-07 DIAGNOSIS — G43A0 Cyclical vomiting, not intractable: Secondary | ICD-10-CM | POA: Diagnosis not present

## 2014-05-07 DIAGNOSIS — R1110 Vomiting, unspecified: Secondary | ICD-10-CM

## 2014-05-07 DIAGNOSIS — R112 Nausea with vomiting, unspecified: Secondary | ICD-10-CM | POA: Diagnosis not present

## 2014-05-07 MED ORDER — SODIUM CHLORIDE 0.9 % IV BOLUS (SEPSIS)
500.0000 mL | Freq: Once | INTRAVENOUS | Status: AC
  Administered 2014-05-07: 500 mL via INTRAVENOUS

## 2014-05-07 MED ORDER — ONDANSETRON HCL 4 MG/2ML IJ SOLN
4.0000 mg | Freq: Once | INTRAMUSCULAR | Status: AC
  Administered 2014-05-07: 4 mg via INTRAVENOUS
  Filled 2014-05-07: qty 2

## 2014-05-07 MED ORDER — SODIUM CHLORIDE 0.9 % IV SOLN
Freq: Once | INTRAVENOUS | Status: AC
  Administered 2014-05-08: via INTRAVENOUS

## 2014-05-07 MED ORDER — METOCLOPRAMIDE HCL 5 MG/ML IJ SOLN
10.0000 mg | Freq: Once | INTRAMUSCULAR | Status: AC
  Administered 2014-05-08: 10 mg via INTRAVENOUS
  Filled 2014-05-07: qty 2

## 2014-05-07 MED ORDER — METHYLPREDNISOLONE SODIUM SUCC 125 MG IJ SOLR
125.0000 mg | Freq: Once | INTRAMUSCULAR | Status: AC
  Administered 2014-05-07: 125 mg via INTRAVENOUS
  Filled 2014-05-07: qty 2

## 2014-05-07 NOTE — ED Provider Notes (Signed)
CSN: [id number]     Arrival date & time 05/07/14  2202 History   First MD Initiated Contact with Patient 05/07/14 2205     Chief Complaint  Patient presents with  . Allergic Reaction     (Consider location/radiation/quality/duration/timing/severity/associated sxs/prior Treatment) HPI Comments: This is 69 year old female who reports approximately 30 minutes after taking ibuprofen for headache developed generalized intense itching, feeling unwell generally, she states started the top of her head and progressed all the way to her feet.  She does say that she did walk the dog during this 30 minute period, but is unaware of being bitten by anything.  At that time.  She called EMS who administered Zantac, Zofran due to patient's nausea.  Additional Benadryl and 0.3 of epi in the lateral right thigh.  On arrival, patient reports that the itching has resolved.  She still just feels generally unwell.  She cannot be more specific than that.  She denies any shortness of breath, difficulty swallowing, visual disturbances, abdominal pain: still reports slight nausea  Patient is a 69 y.o. female presenting with allergic reaction. The history is provided by the patient.  Allergic Reaction Presenting symptoms: itching   Presenting symptoms: no difficulty breathing, no difficulty swallowing, no rash, no swelling and no wheezing   Severity:  Severe Prior allergic episodes:  No prior episodes Context: medications   Relieved by:  Antihistamines and epinephrine Worsened by:  Nothing tried   Past Medical History  Diagnosis Date  . Coronary artery disease   . Hypercholesteremia   . Hypertension   . Diabetes mellitus   . Reflux esophagitis   . Depression   . GERD (gastroesophageal reflux disease)   . Acute MI   . Cancer     breast  . Pulmonary embolism   . Hx of tobacco use, presenting hazards to health 12/21/2013   Past Surgical History  Procedure Laterality Date  . Coronary artery bypass graft      x4  . Ankle surgery      left  . Cholecystectomy    . Cataract extraction, bilateral    . Right renal detachment surgery    . Eye surgery      right  . Mastectomy     Family History  Problem Relation Age of Onset  . Coronary artery disease Mother 32    status post CABG   . Heart attack Father 5    deceased secondary to fatal  first myocardial infarction  . Coronary artery disease Sister 41    alive had bypass surgery  . Cancer Sister   . Deep vein thrombosis Cousin     Neice had after knee surgery   History  Substance Use Topics  . Smoking status: Former Smoker    Quit date: 01/13/2004  . Smokeless tobacco: Not on file  . Alcohol Use: No   OB History    No data available     Review of Systems  Constitutional: Negative for fever and chills.  HENT: Negative for congestion and trouble swallowing.   Eyes: Negative for visual disturbance.  Respiratory: Negative for shortness of breath and wheezing.   Cardiovascular: Negative for chest pain.  Gastrointestinal: Positive for nausea and vomiting. Negative for abdominal pain.  Skin: Positive for itching. Negative for rash.  Neurological: Positive for dizziness.  All other systems reviewed and are negative.     Allergies  Niacin  Home Medications   Prior to Admission medications   Medication Sig Start Date End  Date Taking? Authorizing Provider  acetaminophen (TYLENOL) 500 MG tablet Take 1,000 mg by mouth every 6 (six) hours as needed for pain.    Historical Provider, MD  ALPRAZolam Duanne Moron) 0.25 MG tablet Take 0.25 mg by mouth 3 (three) times daily as needed. For anxiety    Historical Provider, MD  aspirin EC 81 MG tablet Take 1 tablet (81 mg total) by mouth daily. 05/04/13   Larey Dresser, MD  Brimonidine Tartrate-Timolol (COMBIGAN OP) Apply to eye 2 (two) times daily.    Historical Provider, MD  brimonidine-timolol (COMBIGAN) 0.2-0.5 % ophthalmic solution Place 1 drop into both eyes every 12 (twelve) hours.     Historical Provider, MD  carvedilol (COREG) 6.25 MG tablet Take 6.25 mg by mouth 2 (two) times daily with a meal.    Historical Provider, MD  dorzolamide (TRUSOPT) 2 % ophthalmic solution Place 1 drop into the right eye 2 (two) times daily.    Historical Provider, MD  glimepiride (AMARYL) 2 MG tablet Take 2 mg by mouth at bedtime.     Historical Provider, MD  HYDROcodone-acetaminophen (NORCO/VICODIN) 5-325 MG per tablet Take 1 tablet by mouth every 4 (four) hours as needed. 04/12/14   Jarome Matin, MD  hydroxypropyl methylcellulose (ISOPTO TEARS) 2.5 % ophthalmic solution 1 drop as needed for dry eyes.    Historical Provider, MD  ketorolac (ACULAR) 0.4 % SOLN 1 drop 3 (three) times daily.    Historical Provider, MD  metFORMIN (GLUCOPHAGE-XR) 500 MG 24 hr tablet Take 1,000 mg by mouth 2 (two) times daily.      Historical Provider, MD  Probiotic Product (PROBIOTIC COLON SUPPORT PO) Take by mouth daily.    Historical Provider, MD  sertraline (ZOLOFT) 50 MG tablet Take 50 mg by mouth daily.      Historical Provider, MD  simvastatin (ZOCOR) 40 MG tablet Take 40 mg by mouth every evening.    Historical Provider, MD   BP 136/67 mmHg  Pulse 64  Resp 23  SpO2 97% Physical Exam  Constitutional: She appears well-developed and well-nourished.  HENT:  Head: Normocephalic.  Eyes: Pupils are equal, round, and reactive to light.  Neck: Normal range of motion.  Cardiovascular: Normal rate and regular rhythm.   Pulmonary/Chest: Effort normal and breath sounds normal. No respiratory distress. She has no wheezes. She exhibits no tenderness.  Abdominal: She exhibits no distension. There is no tenderness.  Musculoskeletal: Normal range of motion.  Neurological: She is alert.  Skin: Skin is warm. No rash noted.  Patient has scratched her wrists and legs to the point where they are bleeding due to the intense itch  Nursing note and vitals reviewed.   ED Course  Procedures (including critical care time) Labs  Review Labs Reviewed - No data to display  Imaging Review No results found.   EKG Interpretation None     PAtient is no longer having dizziness, itch only complaint this AM is being hungry after one bite of cracker vomiting again Will order labs and admit MDM   Final diagnoses:  None         Junius Creamer, NP 05/08/14 [id number]  Lajean Saver, MD 05/13/14 [phone]

## 2014-05-07 NOTE — ED Notes (Signed)
Pt arrived by EMS from home.  Pt states she took ibuprofen around 6-7pm and broke out in hives, skin became red and flushed, and started itching.  Pt has abrasions bilateral arms and legs from scratching.  Pt states has taken ibuprofen before.  Pt given 0.3 of Epi in triage, 4mg  of Zofran, 50mg  benadryl, 50mg  of Zantac.  C/O N/V at present.

## 2014-05-07 NOTE — ED Notes (Signed)
Pt arrives from home via [facility], reacting to unknown substance. Pt states took ibuprofen which she has taken before, within the hour was itching which became N/V with dizziness.  Excoriation noted BUE, LLE.  Per EMS,  Pt receiverd the following:  0.3 Epi to R thigh 4 zofran 50 benadryl 50 zantac  Pt AOx4, NAD noted at this time, Resp e/u.

## 2014-05-08 ENCOUNTER — Emergency Department (HOSPITAL_COMMUNITY): Payer: Medicare Other

## 2014-05-08 ENCOUNTER — Encounter (HOSPITAL_COMMUNITY): Payer: Self-pay

## 2014-05-08 DIAGNOSIS — I10 Essential (primary) hypertension: Secondary | ICD-10-CM | POA: Insufficient documentation

## 2014-05-08 DIAGNOSIS — G43A0 Cyclical vomiting, not intractable: Secondary | ICD-10-CM | POA: Diagnosis not present

## 2014-05-08 DIAGNOSIS — R42 Dizziness and giddiness: Secondary | ICD-10-CM | POA: Diagnosis not present

## 2014-05-08 DIAGNOSIS — E119 Type 2 diabetes mellitus without complications: Secondary | ICD-10-CM | POA: Insufficient documentation

## 2014-05-08 DIAGNOSIS — T7840XA Allergy, unspecified, initial encounter: Secondary | ICD-10-CM | POA: Diagnosis present

## 2014-05-08 DIAGNOSIS — E7800 Pure hypercholesterolemia, unspecified: Secondary | ICD-10-CM | POA: Insufficient documentation

## 2014-05-08 DIAGNOSIS — R112 Nausea with vomiting, unspecified: Secondary | ICD-10-CM | POA: Diagnosis present

## 2014-05-08 LAB — CBC WITH DIFFERENTIAL/PLATELET
BASOS PCT: 0 % (ref 0–1)
Basophils Absolute: 0 10*3/uL (ref 0.0–0.1)
EOS PCT: 0 % (ref 0–5)
Eosinophils Absolute: 0 10*3/uL (ref 0.0–0.7)
HEMATOCRIT: 40.6 % (ref 36.0–46.0)
HEMOGLOBIN: 13.7 g/dL (ref 12.0–15.0)
Lymphocytes Relative: 2 % — ABNORMAL LOW (ref 12–46)
Lymphs Abs: 0.3 10*3/uL — ABNORMAL LOW (ref 0.7–4.0)
MCH: 30.2 pg (ref 26.0–34.0)
MCHC: 33.7 g/dL (ref 30.0–36.0)
MCV: 89.4 fL (ref 78.0–100.0)
MONOS PCT: 4 % (ref 3–12)
Monocytes Absolute: 0.7 10*3/uL (ref 0.1–1.0)
NEUTROS ABS: 15.9 10*3/uL — AB (ref 1.7–7.7)
NEUTROS PCT: 94 % — AB (ref 43–77)
PLATELETS: 203 10*3/uL (ref 150–400)
RBC: 4.54 MIL/uL (ref 3.87–5.11)
RDW: 12.7 % (ref 11.5–15.5)
WBC: 17 10*3/uL — ABNORMAL HIGH (ref 4.0–10.5)

## 2014-05-08 LAB — I-STAT CHEM 8, ED
BUN: 20 mg/dL (ref 6–23)
CHLORIDE: 104 mmol/L (ref 96–112)
Calcium, Ion: 1.07 mmol/L — ABNORMAL LOW (ref 1.13–1.30)
Creatinine, Ser: 1.1 mg/dL (ref 0.50–1.10)
GLUCOSE: 229 mg/dL — AB (ref 70–99)
HEMATOCRIT: 45 % (ref 36.0–46.0)
HEMOGLOBIN: 15.3 g/dL — AB (ref 12.0–15.0)
Potassium: 3.7 mmol/L (ref 3.5–5.1)
SODIUM: 141 mmol/L (ref 135–145)
TCO2: 20 mmol/L (ref 0–100)

## 2014-05-08 MED ORDER — ONDANSETRON HCL 4 MG PO TABS
4.0000 mg | ORAL_TABLET | Freq: Four times a day (QID) | ORAL | Status: DC
Start: 2014-05-08 — End: 2014-08-06

## 2014-05-08 MED ORDER — PREDNISONE 20 MG PO TABS: ORAL_TABLET | ORAL | Status: DC

## 2014-05-08 MED ORDER — ONDANSETRON HCL 4 MG/2ML IJ SOLN
4.0000 mg | Freq: Once | INTRAMUSCULAR | Status: DC
  Filled 2014-05-08: qty 2

## 2014-05-08 MED ORDER — LORAZEPAM 2 MG/ML IJ SOLN
1.0000 mg | Freq: Once | INTRAMUSCULAR | Status: AC
  Administered 2014-05-08: 1 mg via INTRAVENOUS
  Filled 2014-05-08: qty 1

## 2014-05-08 NOTE — ED Notes (Signed)
Patient placed on the bedpan.  Patient given call ball.  Family at bedside.

## 2014-05-08 NOTE — ED Notes (Signed)
Per NP, patient to be discharged home.

## 2014-05-08 NOTE — ED Provider Notes (Signed)
Care assumed from Florene Glen PA-C at shift change.  Taylor [name] is a 69 y.o. female presenting with rash after taking ibuprofen as well as nausea and vomiting. . Pruritis has stopped but patient still with nausea. She one cracker and vomited. Plan to obtain labs and admit.  Physical Exam  Constitutional: She is well-developed, well-nourished, and in no distress. No distress.  HENT:  Head: Normocephalic and atraumatic.  Mouth/Throat: Oropharynx is clear and moist.  Eyes: Conjunctivae are normal. Right eye exhibits no discharge. Left eye exhibits no discharge.  Cardiovascular: Normal rate and regular rhythm.   Pulmonary/Chest: Effort normal and breath sounds normal. No respiratory distress. She has no wheezes. She has no rales.  Abdominal: Soft. Bowel sounds are normal. She exhibits no distension. There is no tenderness. There is no rebound.  Skin: She is not diaphoretic.    Spoke with Haynes Dage NP with hospitalist who agrees to evaluate the patient with plan for admission. Patient evaluated by admitting team who felt patient was stable for discharge with close PCP follow-up. Without further admission of antiemetics patient tolerating fluids without difficulty. No abdominal tenderness on exam. Pt with leukocytosis which does not clinically correlate. Discussed hydrocortisone, petroleum, Benadryl. Script for prednisone called into pharmacy and patient notified by [place]. Pt to follow up with PCP.  Discussed return precautions with patient. Discussed all results and patient verbalizes understanding and agrees with plan.  1. Allergic reaction, initial encounter   2. Vomiting   3. Hypercholesteremia   4. Essential hypertension   5. 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 Non-intractable vomiting with nausea, vomiting of unspecified type      Results for orders placed or performed  during the hospital encounter of 05/07/14  CBC with Differential  Result Value Ref Range   WBC 17.0 (H) 4.0 - 10.5 K/uL   RBC 4.54 3.87 - 5.11 MIL/uL   Hemoglobin 13.7 12.0 - 15.0 g/dL   HCT 40.6 36.0 - 46.0 %   MCV 89.4 78.0 - 100.0 fL   MCH 30.2 26.0 - 34.0 pg   MCHC 33.7 30.0 - 36.0 g/dL   RDW 12.7 11.5 - 15.5 %   Platelets 203 150 - 400 K/uL   Neutrophils Relative % 94 (H) 43 - 77 %   Neutro Abs 15.9 (H) 1.7 - 7.7 K/uL   Lymphocytes Relative 2 (L) 12 - 46 %   Lymphs Abs 0.3 (L) 0.7 - 4.0 K/uL   Monocytes Relative 4 3 - 12 %   Monocytes Absolute 0.7 0.1 - 1.0 K/uL   Eosinophils Relative 0 0 - 5 %   Eosinophils Absolute 0.0 0.0 - 0.7 K/uL   Basophils Relative 0 0 - 1 %   Basophils Absolute 0.0 0.0 - 0.1 K/uL  I-stat chem 8, ed  Result Value Ref Range   Sodium 141 135 - 145 mmol/L   Potassium 3.7 3.5 - 5.1 mmol/L   Chloride 104 96 - 112 mmol/L   BUN 20 6 - 23 mg/dL   Creatinine, Ser 1.10 0.50 - 1.10 mg/dL   Glucose, Bld 229 (H) 70 - 99 mg/dL   Calcium, Ion 1.07 (L) 1.13 - 1.30 mmol/L   TCO2 20 0 - 100 mmol/L   Hemoglobin 15.3 (H) 12.0 - 15.0 g/dL   HCT 45.0 36.0 - 46.0 %   Ct Head Wo Contrast  05/08/2014   CLINICAL DATA:  Acute onset of dizziness  and vomiting. Initial encounter.  EXAM: CT HEAD WITHOUT CONTRAST  TECHNIQUE: Contiguous axial images were obtained from the base of the skull through the vertex without intravenous contrast.  COMPARISON:  CT of the head performed 03/05/2012, and MRI of the brain performed 06/29/2007  FINDINGS: There is no evidence of acute infarction, mass lesion, or intra- or extra-axial hemorrhage on CT.  Mild periventricular and subcortical white matter change likely reflects small vessel ischemic microangiopathy. Small chronic lacunar infarcts are seen at the basal ganglia bilaterally, and at the right thalamus.  The posterior fossa, including the cerebellum, brainstem and fourth ventricle, is within normal limits. The third and lateral ventricles  are unremarkable in appearance. The cerebral hemispheres are symmetric in appearance, with normal gray-white differentiation. No mass effect or midline shift is seen.  There is no evidence of fracture; visualized osseous structures are unremarkable in appearance. The patient is status post bilateral scleral banding. The orbits are within normal limits. The paranasal sinuses and mastoid air cells are well-aerated. No significant soft tissue abnormalities are seen.  IMPRESSION: 1. No acute intracranial pathology seen on CT. 2. Mild small vessel ischemic microangiopathy. Small chronic lacunar infarcts at the basal ganglia bilaterally, and at the right thalamus.   Electronically Signed   By: Garald Balding M.D.   On: 05/08/2014 03:00   US Transvaginal Non-ob  04/16/2014   CLINICAL DATA:  69 year old female with left adnexal tenderness and pain for 5 days. Initial encounter. Postmenopausal not on hormone replacement therapy.  EXAM: TRANSABDOMINAL AND TRANSVAGINAL ULTRASOUND OF PELVIS  TECHNIQUE: Both transabdominal and transvaginal ultrasound examinations of the pelvis were performed. Transabdominal technique was performed for global imaging of the pelvis including uterus, ovaries, adnexal regions, and pelvic cul-de-sac. It was necessary to proceed with endovaginal exam following the transabdominal exam to visualize the ovaries.  COMPARISON:  None  FINDINGS: Uterus  Measurements: 5.5 x 2.5 x 3.5 cm. No fibroids or other mass visualized.  Endometrium  Thickness: 3 mm. Mildly heterogeneous (images 19 and 21) but no discrete endometrial mass or abnormal vascularity.  Right ovary  Measurements: Not visible despite transabdominal and transvaginal imaging.  Left ovary  Measurements: Not visible despite transabdominal and transvaginal imaging.  Other findings  No free fluid.  IMPRESSION: 1. No pelvic free fluid or adnexal region mass. Neither ovary could be visualized despite trans abdominal and transvaginal imaging. 2.  Diminutive endometrial stripe with minimal heterogeneity, felt to be inconsequential.   Electronically Signed   By: Genevie Ann M.D.   On: 04/16/2014 14:46   US Pelvis Complete  04/16/2014   CLINICAL DATA:  69 year old female with left adnexal tenderness and pain for 5 days. Initial encounter. Postmenopausal not on hormone replacement therapy.  EXAM: TRANSABDOMINAL AND TRANSVAGINAL ULTRASOUND OF PELVIS  TECHNIQUE: Both transabdominal and transvaginal ultrasound examinations of the pelvis were performed. Transabdominal technique was performed for global imaging of the pelvis including uterus, ovaries, adnexal regions, and pelvic cul-de-sac. It was necessary to proceed with endovaginal exam following the transabdominal exam to visualize the ovaries.  COMPARISON:  None  FINDINGS: Uterus  Measurements: 5.5 x 2.5 x 3.5 cm. No fibroids or other mass visualized.  Endometrium  Thickness: 3 mm. Mildly heterogeneous (images 19 and 21) but no discrete endometrial mass or abnormal vascularity.  Right ovary  Measurements: Not visible despite transabdominal and transvaginal imaging.  Left ovary  Measurements: Not visible despite transabdominal and transvaginal imaging.  Other findings  No free fluid.  IMPRESSION: 1. No pelvic free fluid  or adnexal region mass. Neither ovary could be visualized despite trans abdominal and transvaginal imaging. 2. Diminutive endometrial stripe with minimal heterogeneity, felt to be inconsequential.   Electronically Signed   By: Genevie Ann M.D.   On: 04/16/2014 14:46   Dg Hip Unilat With Pelvis 2-3 Views Left  04/12/2014   CLINICAL DATA:  Pain along the anterior aspect of the left hip for 4 days. No known injury  EXAM: LEFT HIP (WITH PELVIS) 2-3 VIEWS  COMPARISON:  None.  FINDINGS: There is no fracture, erosion, or focal bone lesion. Enthesopathic changes, especially to the iliac wings and symphysis pubis. Atherosclerotic calcifications in the pelvis and thighs.  IMPRESSION: No explanation for acute  hip pain.   Electronically Signed   By: Monte Fantasia M.D.   On: 04/12/2014 18:12    Filed Vitals:   05/08/14 0800 05/08/14 0900 05/08/14 0930 05/08/14 0933  BP: 147/66 123/48 142/59   Pulse: 74 71 72   Temp:    97.5 F (36.4 C)  TempSrc:    Oral  Resp:      Height:      Weight:      SpO2:    97%      Al Corpus, PA-C 05/08/14 [place], MD 05/13/14 [phone]

## 2014-05-08 NOTE — ED Notes (Signed)
Patient tolerating PO fluids.  Denies nausea or vomiting.

## 2014-05-08 NOTE — Consult Note (Signed)
[facility] Medical Consultation  Taylor [name] DHR:[id number] DOB: 09/15/1945 DOA: 05/07/2014 PCP:  Melinda Crutch, MD   Requesting physician: Doyce Loose, PA-C Date of consultation: 05/08/2014 Reason for consultation: Consideration for admission  Impression/Recommendations.  Allergic reaction Unknown irritant.  Treated with Solu-Medrol in the ER. No signs of respiratory compromise or dysphagia. Vital signs stable. Patient resting comfortably. Desires discharge to home. Recommend Benadryl cream as needed to affected areas and hydroxyzine when necessary. Close follow-up in the next 24-72 hours with primary care physician strongly recommended.  Nausea and vomiting Likely due to acute reaction and medications. Now appears to have subsided. Treated with IV fluids, Zofran, and Reglan in the ER. Recommend slowly advancing diet at home starting with clear liquids. Zofran when necessary.  Chronic comorbidities: Coronary artery disease No current chest pain. Continue 81 mg aspirin.  Diabetes mellitus Being managed outpatient. Continue amaryl, metformin.  Hyperlipidemia Continue statin.   Chief Complaint: Intense itching  HPI:  Taylor [name] is a 69 year old female with a past medical history of coronary artery disease, diabetes mellitus, hypertension, hyperlipidemia, and PE (not currently on anticoagulation). She reports having some neck pain but feeling fairly well yesterday until she was out walking the dog last evening when she suddenly she felt intense itching "high blood itching" in her head extremities and back. She started scratching and called her sister. Her sister was quite impressed with the episode and called EMS, and she was brought to the ER.  In the ER vital signs have been stable, she did have a white count of 17, glucose was slightly elevated. CT head was negative for any acute intracranial abnormality.  She remained in the ER for approximately 10 hours that she  developed nausea and vomiting. She received IV fluids, Solu-Medrol, Zofran, and Reglan and subsequently improved.  On her exam she is alert, oriented, no longer vomiting, her itching is now controlled. She desires discharge to home.  Review of Systems:  Positive for right-sided neck pain that started 2 days ago, and mild diarrhea. She denies any visual changes, difficulty breathing, difficulty swallowing, recent illness, chest pain, abdominal pain, dysuria, difficulty with walking, myalgias.  Past Medical History  Diagnosis Date  . Coronary artery disease   . Hypercholesteremia   . Hypertension   . Diabetes mellitus   . Reflux esophagitis   . Depression   . GERD (gastroesophageal reflux disease)   . Acute MI   . Cancer     breast  . Pulmonary embolism   . Hx of tobacco use, presenting hazards to health 12/21/2013   Past Surgical History  Procedure Laterality Date  . Coronary artery bypass graft      x4  . Ankle surgery      left  . Cholecystectomy    . Cataract extraction, bilateral    . Right renal detachment surgery    . Eye surgery      right  . Mastectomy     Social History:  reports that she quit smoking about 10 years ago. She does not have any smokeless tobacco history on file. She reports that she does not drink alcohol or use illicit drugs.  she denies any current tobacco or alcohol use. She lives at home with her husband and is independent with her ADLs.  Allergies  Allergen Reactions  . Niacin Other (See Comments)    burning   Family History  Problem Relation Age of Onset  . Coronary artery disease Mother 74    status  post CABG   . Heart attack Father 30    deceased secondary to fatal  first myocardial infarction  . Coronary artery disease Sister 32    alive had bypass surgery  . Cancer Sister   . Deep vein thrombosis Cousin     Neice had after knee surgery    Prior to Admission medications   Medication Sig Start Date End Date Taking? Authorizing  Provider  acetaminophen (TYLENOL) 500 MG tablet Take 1,000 mg by mouth every 6 (six) hours as needed for pain.   Yes Historical Provider, MD  ALPRAZolam (XANAX) 0.25 MG tablet Take 0.25 mg by mouth 3 (three) times daily as needed. For anxiety   Yes Historical Provider, MD  aspirin EC 81 MG tablet Take 1 tablet (81 mg total) by mouth daily. 05/04/13  Yes Larey Dresser, MD  Brimonidine Tartrate-Timolol (COMBIGAN OP) Apply to eye 2 (two) times daily.   Yes Historical Provider, MD  brimonidine-timolol (COMBIGAN) 0.2-0.5 % ophthalmic solution Place 1 drop into both eyes every 12 (twelve) hours.   Yes Historical Provider, MD  carvedilol (COREG) 6.25 MG tablet Take 6.25 mg by mouth 2 (two) times daily with a meal.   Yes Historical Provider, MD  dorzolamide (TRUSOPT) 2 % ophthalmic solution Place 1 drop into the right eye 2 (two) times daily.   Yes Historical Provider, MD  glimepiride (AMARYL) 2 MG tablet Take 2 mg by mouth at bedtime.    Yes Historical Provider, MD  hydroxypropyl methylcellulose (ISOPTO TEARS) 2.5 % ophthalmic solution Place 1 drop into both eyes as needed for dry eyes.    Yes Historical Provider, MD  ibuprofen (ADVIL,MOTRIN) 200 MG tablet Take 200 mg by mouth every 6 (six) hours as needed for mild pain.   Yes Historical Provider, MD  ketorolac (ACULAR) 0.4 % SOLN Place 1 drop into both eyes 3 (three) times daily.    Yes Historical Provider, MD  metFORMIN (GLUCOPHAGE-XR) 500 MG 24 hr tablet Take 1,000 mg by mouth 2 (two) times daily.     Yes Historical Provider, MD  Probiotic Product (PROBIOTIC COLON SUPPORT PO) Take by mouth daily.   Yes Historical Provider, MD  sertraline (ZOLOFT) 50 MG tablet Take 50 mg by mouth daily.     Yes Historical Provider, MD  simvastatin (ZOCOR) 40 MG tablet Take 40 mg by mouth every evening.   Yes Historical Provider, MD  HYDROcodone-acetaminophen (NORCO/VICODIN) 5-325 MG per tablet Take 1 tablet by mouth every 4 (four) hours as needed. Patient not taking:  Reported on 05/07/2014 04/12/14   Jarome Matin, MD   Physical Exam: Filed Vitals:   05/08/14 0530 05/08/14 0700 05/08/14 0730 05/08/14 0800  BP: 134/78 141/59 133/56 147/66  Pulse: 70 78 75 74  Resp:      Height:      Weight:      SpO2:         General:  Well-developed, well-nourished, female, sleepy, no apparent distress, hasn't bedside  Eyes: Pupils are equal and round, sclera clear conjunctiva are pink.  Neck: Supple, no lymphadenopathy, nontender to palpation.  ENT: Moist membranes, good dentition, no exudates or erythema in her oropharynx.  Cardiovascular: Regular rate and rhythm, no murmurs rubs or gallops  Respiratory: Clear to auscultation, no increased work of breathing.  Abdomen: Soft, nontender, nondistended, positive bowel sounds, no masses  Skin: No rashes or bruises, excoriation noted on her distal upper extremities as well as her right leg.  Musculoskeletal: 5 over 5 strength in each,  no effusions noted.  Psychiatric: Alert and oriented, cooperative, appropriate  Neurologic: Cranial nerves II through XII are grossly intact, nonfocal.  Labs on Admission:  Basic Metabolic Panel:  Recent Labs Lab 05/08/14 0641  NA 141  K 3.7  CL 104  GLUCOSE 229*  BUN 20  CREATININE 1.10   CBC:  Recent Labs Lab 05/08/14 0629 05/08/14 0641  WBC 17.0*  --   NEUTROABS 15.9*  --   HGB 13.7 15.3*  HCT 40.6 45.0  MCV 89.4  --   PLT 203  --     Radiological Exams on Admission: Ct Head Wo Contrast  05/08/2014   CLINICAL DATA:  Acute onset of dizziness and vomiting. Initial encounter.  EXAM: CT HEAD WITHOUT CONTRAST  TECHNIQUE: Contiguous axial images were obtained from the base of the skull through the vertex without intravenous contrast.  COMPARISON:  CT of the head performed 03/05/2012, and MRI of the brain performed 06/29/2007  FINDINGS: There is no evidence of acute infarction, mass lesion, or intra- or extra-axial hemorrhage on CT.  Mild periventricular and  subcortical white matter change likely reflects small vessel ischemic microangiopathy. Small chronic lacunar infarcts are seen at the basal ganglia bilaterally, and at the right thalamus.  The posterior fossa, including the cerebellum, brainstem and fourth ventricle, is within normal limits. The third and lateral ventricles are unremarkable in appearance. The cerebral hemispheres are symmetric in appearance, with normal gray-white differentiation. No mass effect or midline shift is seen.  There is no evidence of fracture; visualized osseous structures are unremarkable in appearance. The patient is status post bilateral scleral banding. The orbits are within normal limits. The paranasal sinuses and mastoid air cells are well-aerated. No significant soft tissue abnormalities are seen.  IMPRESSION: 1. No acute intracranial pathology seen on CT. 2. Mild small vessel ischemic microangiopathy. Small chronic lacunar infarcts at the basal ganglia bilaterally, and at the right thalamus.   Electronically Signed   By: Garald Balding M.D.   On: 05/08/2014 03:00    EKG: Not done  Time spent: 45 minutes  Karen Kitchens [facility] Pager [phone]  If 7PM-7AM, please contact night-coverage [email or web address] Password [facility] 05/08/2014, 9:14 AM

## 2014-05-08 NOTE — ED Notes (Signed)
PA Creech at bedside.

## 2014-05-08 NOTE — Discharge Instructions (Signed)
Return to the emergency room with worsening of symptoms, new symptoms or with symptoms that are concerning, shortness of breath, difficulty breathing, swelling of tongue, face, worsening rash. Continue take Benadryl every 4-6 hours. Use Zofran as needed for nausea. You can use over-the-counter hydrocortisone cream for itchy rash. Also use Vaseline or petroleum do not scratch. Do not take ibuprofen. Please call your doctor for a followup appointment within 24-48 hours. When you talk to your doctor please let them know that you were seen in the emergency department and have them acquire all of your records so that they can discuss the findings with you and formulate a treatment plan to fully care for your new and ongoing problems.  Read below information and follow recommendations. Drug Allergy Allergic reactions to medicines are common. Some allergic reactions are mild. A delayed type of drug allergy that occurs 1 week or more after exposure to a medicine or vaccine is called serum sickness. A life-threatening, sudden (acute) allergic reaction that involves the whole body is called anaphylaxis. CAUSES  "True" drug allergies occur when there is an allergic reaction to a medicine. This is caused by overactivity of the immune system. First, the body becomes sensitized. The immune system is triggered by your first exposure to the medicine. Following this first exposure, future exposure to the same medicine may be life-threatening. Almost any medicine can cause an allergic reaction. Common ones are:  Penicillin.  Sulfonamides (sulfa drugs).  Local anesthetics.  X-ray dyes that contain iodine. SYMPTOMS  Common symptoms of a minor allergic reaction are:  Swelling around the mouth.  An itchy red rash or hives.  Vomiting or diarrhea. Anaphylaxis can cause swelling of the mouth and throat. This makes it difficult to breathe and swallow. Severe reactions can be fatal within seconds, even after  exposure to only a trace amount of the drug that causes the reaction. HOME CARE INSTRUCTIONS   If you are unsure of what caused your reaction, keep a diary of foods and medicines used. Include the symptoms that followed. Avoid anything that causes reactions.  You may want to follow up with an allergy specialist after the reaction has cleared in order to be tested to confirm the allergy. It is important to confirm that your reaction is an allergy, not just a side effect to the medicine. If you have a true allergy to a medicine, this may prevent that medicine and related medicines from being given to you when you are very ill.  If you have hives or a rash:  Take medicines as directed by your caregiver.  You may use an over-the-counter antihistamine (diphenhydramine) as needed.  Apply cold compresses to the skin or take baths in cool water. Avoid hot baths or showers.  If you are severely allergic:  Continuous observation after a severe reaction may be needed. Hospitalization is often required.  Wear a medical alert bracelet or necklace stating your allergy.  You and your family must learn how to use an anaphylaxis kit or give an epinephrine injection to temporarily treat an emergency allergic reaction. If you have had a severe reaction, always carry your epinephrine injection or anaphylaxis kit with you. This can be lifesaving if you have a severe reaction.  Do not drive or perform tasks after treatment until the medicines used to treat your reaction have worn off, or until your caregiver says it is okay. SEEK MEDICAL CARE IF:   You think you had an allergic reaction. Symptoms usually start within 30  minutes after exposure.  Symptoms are getting worse rather than better.  You develop new symptoms.  The symptoms that brought you to your caregiver return. SEEK IMMEDIATE MEDICAL CARE IF:   You have swelling of the mouth, difficulty breathing, or wheezing.  You have a tight feeling in  your chest or throat.  You develop hives, swelling, or itching all over your body.  You develop severe vomiting or diarrhea.  You feel faint or pass out. This is an emergency. Use your epinephrine injection or anaphylaxis kit as you have been instructed. Call for emergency medical help. Even if you improve after the injection, you need to be examined at a hospital emergency department. MAKE SURE YOU:   Understand these instructions.  Will watch your condition.  Will get help right away if you are not doing well or get worse. Document Released: 12/29/2004 Document Revised: 03/23/2011 Document Reviewed: 06/04/2010 [facility] Patient Information 2015 [place], [place]. This information is not intended to replace advice given to you by your health care provider. Make sure you discuss any questions you have with your health care provider.  Nausea and Vomiting Nausea is a sick feeling that often comes before throwing up (vomiting). Vomiting is a reflex where stomach contents come out of your mouth. Vomiting can cause severe loss of body fluids (dehydration). Children and elderly adults can become dehydrated quickly, especially if they also have diarrhea. Nausea and vomiting are symptoms of a condition or disease. It is important to find the cause of your symptoms. CAUSES   Direct irritation of the stomach lining. This irritation can result from increased acid production (gastroesophageal reflux disease), infection, food poisoning, taking certain medicines (such as nonsteroidal anti-inflammatory drugs), alcohol use, or tobacco use.  Signals from the brain.These signals could be caused by a headache, heat exposure, an inner ear disturbance, increased pressure in the brain from injury, infection, a tumor, or a concussion, pain, emotional stimulus, or metabolic problems.  An obstruction in the gastrointestinal tract (bowel obstruction).  Illnesses such as diabetes, hepatitis, gallbladder problems,  appendicitis, kidney problems, cancer, sepsis, atypical symptoms of a heart attack, or eating disorders.  Medical treatments such as chemotherapy and radiation.  Receiving medicine that makes you sleep (general anesthetic) during surgery. DIAGNOSIS Your caregiver may ask for tests to be done if the problems do not improve after a few days. Tests may also be done if symptoms are severe or if the reason for the nausea and vomiting is not clear. Tests may include:  Urine tests.  Blood tests.  Stool tests.  Cultures (to look for evidence of infection).  X-rays or other imaging studies. Test results can help your caregiver make decisions about treatment or the need for additional tests. TREATMENT You need to stay well hydrated. Drink frequently but in small amounts.You may wish to drink water, sports drinks, clear broth, or eat frozen ice pops or gelatin dessert to help stay hydrated.When you eat, eating slowly may help prevent nausea.There are also some antinausea medicines that may help prevent nausea. HOME CARE INSTRUCTIONS   Take all medicine as directed by your caregiver.  If you do not have an appetite, do not force yourself to eat. However, you must continue to drink fluids.  If you have an appetite, eat a normal diet unless your caregiver tells you differently.  Eat a variety of complex carbohydrates (rice, wheat, potatoes, bread), lean meats, yogurt, fruits, and vegetables.  Avoid high-fat foods because they are more difficult to digest.  Drink  enough water and fluids to keep your urine clear or pale yellow.  If you are dehydrated, ask your caregiver for specific rehydration instructions. Signs of dehydration may include:  Severe thirst.  Dry lips and mouth.  Dizziness.  Dark urine.  Decreasing urine frequency and amount.  Confusion.  Rapid breathing or pulse. SEEK IMMEDIATE MEDICAL CARE IF:   You have blood or brown flecks (like coffee grounds) in your  vomit.  You have black or bloody stools.  You have a severe headache or stiff neck.  You are confused.  You have severe abdominal pain.  You have chest pain or trouble breathing.  You do not urinate at least once every 8 hours.  You develop cold or clammy skin.  You continue to vomit for longer than 24 to 48 hours.  You have a fever. MAKE SURE YOU:   Understand these instructions.  Will watch your condition.  Will get help right away if you are not doing well or get worse. Document Released: 12/29/2004 Document Revised: 03/23/2011 Document Reviewed: 05/28/2010 [facility] Patient Information 2015 [place], [place]. This information is not intended to replace advice given to you by your health care provider. Make sure you discuss any questions you have with your health care provider.

## 2014-05-08 NOTE — ED Provider Notes (Signed)
Patient developed diffuse itching accompanied by nausea today after taking ibuprofen 8 PM. She was treated by EMS with epinephrine and Zofran, Benadryl and Zantac intravenously. Presently still complaining of nausea. Presently patient is alert Glasgow Coma Score 15 no respiratory distress lungs clear to auscultation  Orlie Dakin, MD 05/08/14 0011

## 2014-05-08 NOTE — ED Notes (Signed)
Admitting MD and NP at bedside.  Unable to transport this patient at this time.

## 2014-05-09 DIAGNOSIS — T7840XD Allergy, unspecified, subsequent encounter: Secondary | ICD-10-CM | POA: Diagnosis not present

## 2014-05-09 DIAGNOSIS — R799 Abnormal finding of blood chemistry, unspecified: Secondary | ICD-10-CM | POA: Diagnosis not present

## 2014-05-09 DIAGNOSIS — M542 Cervicalgia: Secondary | ICD-10-CM | POA: Diagnosis not present

## 2014-05-23 ENCOUNTER — Encounter: Payer: Self-pay | Admitting: Cardiology

## 2014-05-30 DIAGNOSIS — L299 Pruritus, unspecified: Secondary | ICD-10-CM | POA: Diagnosis not present

## 2014-05-30 DIAGNOSIS — R109 Unspecified abdominal pain: Secondary | ICD-10-CM | POA: Diagnosis not present

## 2014-05-30 DIAGNOSIS — D72829 Elevated white blood cell count, unspecified: Secondary | ICD-10-CM | POA: Diagnosis not present

## 2014-05-30 DIAGNOSIS — R748 Abnormal levels of other serum enzymes: Secondary | ICD-10-CM | POA: Diagnosis not present

## 2014-07-27 DIAGNOSIS — F419 Anxiety disorder, unspecified: Secondary | ICD-10-CM | POA: Diagnosis not present

## 2014-07-27 DIAGNOSIS — Z86711 Personal history of pulmonary embolism: Secondary | ICD-10-CM | POA: Diagnosis not present

## 2014-07-27 DIAGNOSIS — E1165 Type 2 diabetes mellitus with hyperglycemia: Secondary | ICD-10-CM | POA: Diagnosis not present

## 2014-07-27 DIAGNOSIS — E780 Pure hypercholesterolemia: Secondary | ICD-10-CM | POA: Diagnosis not present

## 2014-07-27 DIAGNOSIS — Z0001 Encounter for general adult medical examination with abnormal findings: Secondary | ICD-10-CM | POA: Diagnosis not present

## 2014-07-27 DIAGNOSIS — I868 Varicose veins of other specified sites: Secondary | ICD-10-CM | POA: Diagnosis not present

## 2014-07-27 DIAGNOSIS — I10 Essential (primary) hypertension: Secondary | ICD-10-CM | POA: Diagnosis not present

## 2014-08-06 ENCOUNTER — Ambulatory Visit (INDEPENDENT_AMBULATORY_CARE_PROVIDER_SITE_OTHER): Payer: Medicare Other | Admitting: Physician Assistant

## 2014-08-06 ENCOUNTER — Encounter: Payer: Self-pay | Admitting: Physician Assistant

## 2014-08-06 VITALS — BP 160/72 | HR 59 | Ht 67.5 in | Wt 154.0 lb

## 2014-08-06 DIAGNOSIS — E7800 Pure hypercholesterolemia, unspecified: Secondary | ICD-10-CM

## 2014-08-06 DIAGNOSIS — R0989 Other specified symptoms and signs involving the circulatory and respiratory systems: Secondary | ICD-10-CM

## 2014-08-06 DIAGNOSIS — R079 Chest pain, unspecified: Secondary | ICD-10-CM

## 2014-08-06 MED ORDER — NITROGLYCERIN 0.4 MG SL SUBL: 0.4000 mg | SUBLINGUAL_TABLET | SUBLINGUAL | Status: DC | PRN

## 2014-08-06 MED ORDER — ROSUVASTATIN CALCIUM 10 MG PO TABS: 10.0000 mg | ORAL_TABLET | Freq: Every day | ORAL | Status: DC

## 2014-08-06 NOTE — Patient Instructions (Signed)
Medication Instructions:  Start Nitro as needed for chest pain and Start Crestor (10 ) mg daily sent into requested pharmacy   Labwork: Your physician recommends that you return for a FASTING lipid profile: August 29 just walk in between 8-5   Testing/Procedures: Your physician has requested that you have en exercise stress myoview. For further information please visit [email or web address]. Please follow instruction sheet, as given.  Your physician has requested that you have cardiac CT. Cardiac computed tomography (CT) is a painless test that uses an x-ray machine to take clear, detailed pictures of your heart. For further information please visit [email or web address]. Please follow instruction sheet as given.     Follow-Up: Your physician recommends that you keep your scheduled  follow-up appointment with Dr. Aundra Dubin   Any Other Special Instructions Will Be Listed Below (If Applicable).

## 2014-08-06 NOTE — Assessment & Plan Note (Signed)
Patient had CABG in 2006. She is now having chest pain when she gets upset. Recommend stress Myoview. Sublingual nitroglycerin when necessary.

## 2014-08-06 NOTE — Progress Notes (Signed)
Cardiology Office Note   Date:  08/06/2014   ID:  Taylor [name], DOB 01-26-1945, MRN [id number]  PCP:   Melinda Crutch, MD  Cardiologist:  Dr. Aundra Dubin  Chief Complaint:    History of Present Illness: Taylor [name] is a 69 y.o. female who presents for  one-year follow-up. She has a history of CAD status post CABG in 2006 and pulmonary embolus in 02/2012 with no known trigger. She had a negative hyper coaguable workup and was told by her oncologist that she can stop her Coumadin 04/2013. She also has history of hyperlipidemia, type 2 diabetes mellitus, hypertension, 40-59% bilateral ICA stenosis 09/2013. She had lower extremity Dopplers performed in 12/2013 and 03/2014 because of leg pain but they were negative for DVT.  Patient is quite tearful in the office today. She is under extreme amount of stress at home. She actually moved in with her son to help care for her 100 year old grandson. There is a lot of stress with the ex-girlfriend.  The patient says when she becomes upset she feels like a truck is sitting on her chest and she can't breathe. She gets very tight in her chest. She says it's not like the angina she had before her bypass but it is tight. It only happens under emotional stress and anger. She becomes so upset she feels like her heart is going to stop. She walks and plays with her grandson without any symptoms. She doesn't walk a lot because of chronic leg pain. She denies a exertional chest pain, palpitations, dyspnea, dizziness or presyncope.    Patient stopped her simvastatin 6 weeks ago because of increased LFTs and which she was reading about statins. Her primary care recommended trying Crestor but she has not started this.   Past Medical History  Diagnosis Date  . Coronary artery disease   . Hypercholesteremia   . Hypertension   . Diabetes mellitus   . Reflux esophagitis   . Depression   . GERD (gastroesophageal reflux disease)   . Acute MI   . Cancer     breast  .  Pulmonary embolism   . Hx of tobacco use, presenting hazards to health 12/21/2013    Past Surgical History  Procedure Laterality Date  . Coronary artery bypass graft      x4  . Ankle surgery      left  . Cholecystectomy    . Cataract extraction, bilateral    . Right renal detachment surgery    . Eye surgery      right  . Mastectomy       Current Outpatient Prescriptions  Medication Sig Dispense Refill  . acetaminophen (TYLENOL) 500 MG tablet Take 1,000 mg by mouth every 6 (six) hours as needed for pain.    [name] ALPRAZolam (XANAX) 0.25 MG tablet Take 0.25 mg by mouth 3 (three) times daily as needed. For anxiety    . aspirin EC 81 MG tablet Take 1 tablet (81 mg total) by mouth daily. 90 tablet 0  . carvedilol (COREG) 6.25 MG tablet Take 6.25 mg by mouth 2 (two) times daily with a meal.    . dorzolamide (TRUSOPT) 2 % ophthalmic solution Place 1 drop into the right eye 2 (two) times daily.    [name] glimepiride (AMARYL) 2 MG tablet Take 2 mg by mouth at bedtime.     . hydroxypropyl methylcellulose (ISOPTO TEARS) 2.5 % ophthalmic solution Place 1 drop into both eyes as needed for dry eyes.     [name]  metFORMIN (GLUCOPHAGE-XR) 500 MG 24 hr tablet Take 1,000 mg by mouth 2 (two) times daily.      . sertraline (ZOLOFT) 50 MG tablet Take 50 mg by mouth daily.      . Brimonidine Tartrate-Timolol (COMBIGAN OP) Apply to eye 2 (two) times daily.     No current facility-administered medications for this visit.    Allergies:   Niacin    Social History:  The patient  reports that she quit smoking about 10 years ago. She does not have any smokeless tobacco history on file. She reports that she does not drink alcohol or use illicit drugs.   Family History:  The patient's    family history includes Cancer in her sister; Coronary artery disease (age of onset: 56) in her sister; Coronary artery disease (age of onset: 40) in her mother; Deep vein thrombosis in her cousin; Heart attack (age of onset: 68) in her  father.    ROS:  Please see the history of present illness.   Otherwise, review of systems are positive for excessive fatigue, chronic leg pain, hearing loss, snoring, anxiety, headaches.   All other systems are reviewed and negative.    PHYSICAL EXAM: VS:  BP 160/72 mmHg  Pulse 59  Ht 5' 7.5" (1.715 m)  Wt 154 lb (69.854 kg)  BMI 23.75 kg/m2 , BMI Body mass index is 23.75 kg/(m^2). GEN: Well nourished, well developed, in no acute distress Neck: no JVD, HJR, carotid bruits, or masses Cardiac:  RRR; positive S4, no murmurs, rubs, thrill or heave,  Respiratory:  clear to auscultation bilaterally, normal work of breathing GI: soft, nontender, nondistended, + BS MS: no deformity or atrophy Extremities: Varicosities, without cyanosis, clubbing, edema, good distal pulses bilaterally.  Skin: warm and dry, no rash Neuro:  Strength and sensation are intact    EKG:  EKG is ordered today. The ekg ordered today demonstrates sinus bradycardia 59 bpm with incomplete right bundle branch block, no acute change   Recent Labs: 04/12/2014: ALT 14 05/08/2014: BUN 20; Creatinine, Ser 1.10; Hemoglobin 15.3*; Platelets 203; Potassium 3.7; Sodium 141    Lipid Panel    Component Value Date/Time   CHOL 109 05/09/2013 0938   TRIG 204.0* 05/09/2013 0938   HDL 26.50* 05/09/2013 0938   CHOLHDL 4 05/09/2013 0938   VLDL 40.8* 05/09/2013 0938   LDLCALC 42 05/09/2013 0938      Wt Readings from Last 3 Encounters:  08/06/14 154 lb (69.854 kg)  05/07/14 160 lb (72.576 kg)  12/18/13 162 lb 11.2 oz (73.8 kg)      Other studies Reviewed: Additional studies/ records that were reviewed today include and review of the records demonstrates:  Lower extremity venous Dopplers 04/12/14 Summary:  - No evidence of deep vein or superficial thrombosis involving the   left lower extremity and right common femoral vein. - No evidence of Baker&'s cyst on the left.  Other specific details can be found in the  table(s) above. Prepared and Electronically Authenticated by  Curt Jews 2016-04-02T10:59:    ASSESSMENT AND PLAN:  Chest pain Patient complains of chest tightness when she becomes upset or angry. She has history of CABG 10 years ago. She has not had a stress test in a long time. Recommend exercise Myoview to rule out ischemia.  Hypertension Patient's blood pressure is elevated today. She forgot to take her Coreg. She says her blood pressures are well-controlled at home. I asked her to continue to monitor this.  CAD, ARTERY BYPASS  GRAFT Patient had CABG in 2006. She is now having chest pain when she gets upset. Recommend stress Myoview. Sublingual nitroglycerin when necessary.  Hypercholesteremia Patient's cholesterol is elevated. She stopped her simvastatin 6 weeks ago because of increase in liver functions. Her primary care had recommended Crestor but she has not started. Will prescribe Crestor 10 mg once daily. Follow-up lipid and LFTs in 6 weeks.  CAROTID BRUIT, LEFT Carotid Dopplers are due for September    Signed, Michele Lenze, [place]  08/06/2014 12:25 PM    [facility] [address], [place], [place]  [address] Phone: [phone]; Fax: [phone]

## 2014-08-06 NOTE — Assessment & Plan Note (Signed)
Patient's cholesterol is elevated. She stopped her simvastatin 6 weeks ago because of increase in liver functions. Her primary care had recommended Crestor but she has not started. Will prescribe Crestor 10 mg once daily. Follow-up lipid and LFTs in 6 weeks.

## 2014-08-06 NOTE — Assessment & Plan Note (Signed)
Patient complains of chest tightness when she becomes upset or angry. She has history of CABG 10 years ago. She has not had a stress test in a long time. Recommend exercise Myoview to rule out ischemia.

## 2014-08-06 NOTE — Assessment & Plan Note (Signed)
Carotid Dopplers are due for September

## 2014-08-06 NOTE — Assessment & Plan Note (Signed)
Patient's blood pressure is elevated today. She forgot to take her Coreg. She says her blood pressures are well-controlled at home. I asked her to continue to monitor this.

## 2014-08-08 ENCOUNTER — Ambulatory Visit (HOSPITAL_COMMUNITY): Payer: Medicare Other | Attending: Cardiology

## 2014-08-08 VITALS — Ht 67.5 in | Wt 154.0 lb

## 2014-08-08 DIAGNOSIS — I10 Essential (primary) hypertension: Secondary | ICD-10-CM | POA: Insufficient documentation

## 2014-08-08 DIAGNOSIS — R0602 Shortness of breath: Secondary | ICD-10-CM

## 2014-08-08 DIAGNOSIS — R079 Chest pain, unspecified: Secondary | ICD-10-CM | POA: Insufficient documentation

## 2014-08-08 DIAGNOSIS — R5383 Other fatigue: Secondary | ICD-10-CM | POA: Insufficient documentation

## 2014-08-08 DIAGNOSIS — Z8249 Family history of ischemic heart disease and other diseases of the circulatory system: Secondary | ICD-10-CM | POA: Insufficient documentation

## 2014-08-08 DIAGNOSIS — E119 Type 2 diabetes mellitus without complications: Secondary | ICD-10-CM | POA: Diagnosis not present

## 2014-08-08 DIAGNOSIS — Z951 Presence of aortocoronary bypass graft: Secondary | ICD-10-CM | POA: Insufficient documentation

## 2014-08-08 DIAGNOSIS — E7800 Pure hypercholesterolemia, unspecified: Secondary | ICD-10-CM

## 2014-08-08 DIAGNOSIS — Z87891 Personal history of nicotine dependence: Secondary | ICD-10-CM | POA: Insufficient documentation

## 2014-08-08 DIAGNOSIS — I2510 Atherosclerotic heart disease of native coronary artery without angina pectoris: Secondary | ICD-10-CM | POA: Insufficient documentation

## 2014-08-08 LAB — MYOCARDIAL PERFUSION IMAGING
LHR: 0.27
LVDIAVOL: 122 mL
LVSYSVOL: 60 mL
Peak HR: 78 {beats}/min
Rest HR: 55 {beats}/min
SDS: 2
SRS: 4
SSS: 6
TID: 1.03

## 2014-08-08 MED ORDER — TECHNETIUM TC 99M SESTAMIBI GENERIC - CARDIOLITE
11.0000 | Freq: Once | INTRAVENOUS | Status: AC | PRN
  Administered 2014-08-08: 11 via INTRAVENOUS

## 2014-08-08 MED ORDER — REGADENOSON 0.4 MG/5ML IV SOLN
0.4000 mg | Freq: Once | INTRAVENOUS | Status: AC
  Administered 2014-08-08: 0.4 mg via INTRAVENOUS

## 2014-08-08 MED ORDER — TECHNETIUM TC 99M SESTAMIBI GENERIC - CARDIOLITE
32.2000 | Freq: Once | INTRAVENOUS | Status: AC | PRN
  Administered 2014-08-08: 32.2 via INTRAVENOUS

## 2014-08-08 MED ORDER — AMINOPHYLLINE 25 MG/ML IV SOLN
75.0000 mg | Freq: Two times a day (BID) | INTRAVENOUS | Status: AC | PRN
  Administered 2014-08-08: 75 mg via INTRAVENOUS

## 2014-08-13 ENCOUNTER — Encounter: Payer: Self-pay | Admitting: Cardiology

## 2014-09-10 ENCOUNTER — Ambulatory Visit
Admission: RE | Admit: 2014-09-10 | Discharge: 2014-09-10 | Disposition: A | Discharge status: Home / Self Care | Payer: Medicare Other | Source: Ambulatory Visit | Attending: Oncology | Admitting: Oncology

## 2014-09-10 ENCOUNTER — Other Ambulatory Visit (INDEPENDENT_AMBULATORY_CARE_PROVIDER_SITE_OTHER): Payer: Medicare Other | Admitting: *Deleted

## 2014-09-10 DIAGNOSIS — Z1231 Encounter for screening mammogram for malignant neoplasm of breast: Secondary | ICD-10-CM

## 2014-09-10 DIAGNOSIS — R079 Chest pain, unspecified: Secondary | ICD-10-CM | POA: Diagnosis not present

## 2014-09-10 DIAGNOSIS — E7800 Pure hypercholesterolemia, unspecified: Secondary | ICD-10-CM

## 2014-09-10 LAB — HEPATIC FUNCTION PANEL
ALBUMIN: 4.1 g/dL (ref 3.5–5.2)
ALT: 11 U/L (ref 0–35)
AST: 16 U/L (ref 0–37)
Alkaline Phosphatase: 45 U/L (ref 39–117)
Bilirubin, Direct: 0.1 mg/dL (ref 0.0–0.3)
Total Bilirubin: 0.5 mg/dL (ref 0.2–1.2)
Total Protein: 7.2 g/dL (ref 6.0–8.3)

## 2014-09-10 LAB — LIPID PANEL
Cholesterol: 92 mg/dL (ref 0–200)
HDL: 29.7 mg/dL — AB (ref >39.00)
LDL CALC: 49 mg/dL (ref 0–99)
NonHDL: 62.25
Total CHOL/HDL Ratio: 3
Triglycerides: 64 mg/dL (ref 0.0–149.0)
VLDL: 12.8 mg/dL (ref 0.0–40.0)

## 2014-09-10 NOTE — Addendum Note (Signed)
Addended by: Eulis Foster on: 09/10/2014 08:40 AM   Modules accepted: Orders

## 2014-09-14 DIAGNOSIS — H40053 Ocular hypertension, bilateral: Secondary | ICD-10-CM | POA: Diagnosis not present

## 2014-09-14 DIAGNOSIS — Z961 Presence of intraocular lens: Secondary | ICD-10-CM | POA: Diagnosis not present

## 2014-09-14 DIAGNOSIS — H31091 Other chorioretinal scars, right eye: Secondary | ICD-10-CM | POA: Diagnosis not present

## 2014-09-24 ENCOUNTER — Ambulatory Visit (HOSPITAL_COMMUNITY)
Admission: RE | Admit: 2014-09-24 | Discharge: 2014-09-24 | Disposition: A | Discharge status: Home / Self Care | Payer: Medicare Other | Source: Ambulatory Visit | Attending: Physician Assistant | Admitting: Physician Assistant

## 2014-09-24 DIAGNOSIS — E119 Type 2 diabetes mellitus without complications: Secondary | ICD-10-CM | POA: Diagnosis not present

## 2014-09-24 DIAGNOSIS — R0989 Other specified symptoms and signs involving the circulatory and respiratory systems: Secondary | ICD-10-CM | POA: Insufficient documentation

## 2014-09-24 DIAGNOSIS — F17200 Nicotine dependence, unspecified, uncomplicated: Secondary | ICD-10-CM | POA: Insufficient documentation

## 2014-09-24 DIAGNOSIS — I10 Essential (primary) hypertension: Secondary | ICD-10-CM | POA: Diagnosis not present

## 2014-09-24 DIAGNOSIS — I739 Peripheral vascular disease, unspecified: Secondary | ICD-10-CM | POA: Insufficient documentation

## 2014-09-24 DIAGNOSIS — E785 Hyperlipidemia, unspecified: Secondary | ICD-10-CM | POA: Insufficient documentation

## 2014-09-24 DIAGNOSIS — I2510 Atherosclerotic heart disease of native coronary artery without angina pectoris: Secondary | ICD-10-CM | POA: Insufficient documentation

## 2014-09-24 DIAGNOSIS — I6523 Occlusion and stenosis of bilateral carotid arteries: Secondary | ICD-10-CM | POA: Diagnosis not present

## 2014-09-28 DIAGNOSIS — M62838 Other muscle spasm: Secondary | ICD-10-CM | POA: Diagnosis not present

## 2014-11-02 ENCOUNTER — Ambulatory Visit (INDEPENDENT_AMBULATORY_CARE_PROVIDER_SITE_OTHER): Payer: Medicare Other | Admitting: Cardiology

## 2014-11-02 ENCOUNTER — Encounter: Payer: Self-pay | Admitting: Cardiology

## 2014-11-02 VITALS — BP 138/68 | HR 71 | Ht 67.5 in | Wt 154.8 lb

## 2014-11-02 DIAGNOSIS — I2699 Other pulmonary embolism without acute cor pulmonale: Secondary | ICD-10-CM | POA: Diagnosis not present

## 2014-11-02 DIAGNOSIS — I2510 Atherosclerotic heart disease of native coronary artery without angina pectoris: Secondary | ICD-10-CM | POA: Diagnosis not present

## 2014-11-02 DIAGNOSIS — K2100 Gastro-esophageal reflux disease with esophagitis, without bleeding: Secondary | ICD-10-CM

## 2014-11-02 DIAGNOSIS — I2583 Coronary atherosclerosis due to lipid rich plaque: Secondary | ICD-10-CM

## 2014-11-02 DIAGNOSIS — I739 Peripheral vascular disease, unspecified: Secondary | ICD-10-CM

## 2014-11-02 NOTE — Patient Instructions (Signed)
Medication Instructions:  Your physician recommends that you continue on your current medications as directed. Please refer to the Current Medication list given to you today. If you need a refill on your cardiac medications before your next appointment, please call your pharmacy.   Labwork: None   Testing/Procedures: Your physician has requested that you have a lower extremity arterial exercise duplex. During this test, exercise and ultrasound are used to evaluate arterial blood flow in the legs. Allow one hour for this exam. There are no restrictions or special instructions.   Follow-Up: Your physician wants you to follow-up in: 12 months with Dr. Aundra Dubin. You will receive a reminder letter in the mail two months in advance. If you don't receive a letter, please call our office to schedule the follow-up appointment.   Any Other Special Instructions Will Be Listed Below (If Applicable).

## 2014-11-03 DIAGNOSIS — I739 Peripheral vascular disease, unspecified: Secondary | ICD-10-CM | POA: Insufficient documentation

## 2014-11-03 NOTE — Progress Notes (Signed)
Patient ID: Taylor [name], female   DOB: 1945/01/19, 69 y.o.   MRN: [id number] PCP: Dr. Harrington Challenger  69 yo with history of CAD s/p CABG and PE in 2/14 presents for cardiology followup.  She had CABG in 2006 and has not had a catheterization since that time.  She was found to have PE in 2/14 without known trigger.  She saw her oncologist after that with no sign of recurrent cancer.  She had a negative hypercoagulable workup and was eventually told by her oncologist that she could stop warfarin.    She has had chronic chest pain for the 9-10 months.  She has a daily sensation of chest heaviness that is fairly mild.  It is nonexertional.  Xanax helps.  She has been under a lot of stress at home and thinks that this contributes. Cardiolite in 7/16 was low risk with no ischemia.    She also reports L>R calf pain with ambulation.    Labs (8/14): K 4.2, creatinine 1.0 Labs (4/15): K 4.6, creatinine 0.8 Labs (4/16): K 3.7, creatinine 1.1 Labs (8/16): LDL 49, HDL 28, LFTs normal  PMH: 1. Depression 2. Breast cancer: s/p left mastectomy in 3/11.  3. PE 5/05: Uncertain etiology, no trigger.  Factor V Leiden negative, prothrombin gene mutation negative, but lupus anticoagulant positive.  Venous dopplers (8/14) with left greater saphenous vein superficial thrombosis.  Repeat antiphospholipid antibody workup negative in 8/14 and again in 1/15.  4. Hyperlipidemia 5. Type II diabetes 6. HTN 7. Cholescystectomy 8. GERD 9. CAD: s/p CABG in 5/06 with LIMA-LAD, sequential SVG-D and OM, SVG-PDA.  Echo (8/14) with EF 60%, mild LVH, normal RV.  Cardiolite (7/16) with small fixed lateral defect, no ischemia, EF 51% => low risk.  10. Carotid stenosis: carotids (8/14) with 40-59% bilateral ICA stenosis. Carotid dopplers (9/15) with 1-39% BICA stenosis.  11. Glaucoma  SH: Married, quit smoking in 2006, lives in [place].  FH: Mother and father with MIs, sister with CABG in 107.   ROS: All systems reviewed and negative  except as per HPI.   Current Outpatient Prescriptions  Medication Sig Dispense Refill  . acetaminophen (TYLENOL) 500 MG tablet Take 1,000 mg by mouth every 6 (six) hours as needed for pain.    [name] ALPRAZolam (XANAX) 0.25 MG tablet Take 0.25 mg by mouth 3 (three) times daily as needed. For anxiety    . aspirin EC 81 MG tablet Take 1 tablet (81 mg total) by mouth daily. 90 tablet 0  . Brimonidine Tartrate-Timolol (COMBIGAN OP) Apply to eye 2 (two) times daily.    . carvedilol (COREG) 6.25 MG tablet Take 6.25 mg by mouth 2 (two) times daily with a meal.    . dorzolamide (TRUSOPT) 2 % ophthalmic solution Place 1 drop into the right eye 2 (two) times daily.    [name] glimepiride (AMARYL) 2 MG tablet Take 2 mg by mouth at bedtime.     . hydroxypropyl methylcellulose (ISOPTO TEARS) 2.5 % ophthalmic solution Place 1 drop into both eyes as needed for dry eyes.     . metFORMIN (GLUCOPHAGE-XR) 500 MG 24 hr tablet Take 1,000 mg by mouth 2 (two) times daily.      . nitroGLYCERIN (NITROSTAT) 0.4 MG SL tablet Place 1 tablet (0.4 mg total) under the tongue every 5 (five) minutes as needed for chest pain. 25 tablet 3  . sertraline (ZOLOFT) 50 MG tablet Take 50 mg by mouth daily.      . simvastatin (ZOCOR) 20  MG tablet Take 20 mg by mouth daily.     No current facility-administered medications for this visit.   Facility-Administered Medications Ordered in Other Visits  Medication Dose Route Frequency Provider Last Rate Last Dose  . aminophylline injection 75 mg  75 mg Intravenous BID PRN Larey Dresser, MD   75 mg at 08/08/14 1335    BP 138/68 mmHg  Pulse 71  Ht 5' 7.5" (1.715 m)  Wt 154 lb 12.8 oz (70.217 kg)  BMI 23.87 kg/m2  SpO2 98% General: NAD Neck: No JVD, no thyromegaly or thyroid nodule.  Lungs: Clear to auscultation bilaterally with normal respiratory effort. CV: Nondisplaced PMI.  Heart regular S1/S2, no S3/S4, no murmur.  No peripheral edema.  Left carotid bruit.  Difficult to palpate pedal  pulses.  Abdomen: Soft, nontender, no hepatosplenomegaly, no distention.  Skin: Intact without lesions or rashes.  Neurologic: Alert and oriented x 3.  Psych: Normal affect. Extremities: No clubbing or cyanosis.   Assessment/Plan: 1. CAD: Stable s/p CABG.  Chronic atypical chest pain may be stress-related.  Cardiolite in 7/16 was low risk with no ischemia.  Continue ASA 81, Coreg and simvastatin.  2. PE: PE in 2/14.  On initial hypercoagulable workup, lupus anticoagulant was positive, raising concern for antiphospholipid antibody syndrome.  However, repeat antiphospholipid antibody panels in 8/14 and 1/15 were negative and her oncologist let her stop warfarin.  3. Carotid bruit: Minimal stenosis on carotid dopplers in 9/16.  4. Hyperlipidemia: Good lipids in 8/16.  5. PAD: Leg pain may be consistent with claudication.  Difficult to palpate pedal pulses.  I will arrange for peripheral arterial doppler evaluation.   Followup 1 year.   Loralie Champagne 11/03/2014

## 2014-11-06 ENCOUNTER — Other Ambulatory Visit: Payer: Self-pay | Admitting: Cardiology

## 2014-11-06 ENCOUNTER — Ambulatory Visit (HOSPITAL_COMMUNITY)
Admission: RE | Admit: 2014-11-06 | Discharge: 2014-11-06 | Disposition: A | Discharge status: Home / Self Care | Payer: Medicare Other | Source: Ambulatory Visit | Attending: Cardiology | Admitting: Cardiology

## 2014-11-06 DIAGNOSIS — E119 Type 2 diabetes mellitus without complications: Secondary | ICD-10-CM | POA: Diagnosis not present

## 2014-11-06 DIAGNOSIS — I10 Essential (primary) hypertension: Secondary | ICD-10-CM | POA: Insufficient documentation

## 2014-11-06 DIAGNOSIS — E785 Hyperlipidemia, unspecified: Secondary | ICD-10-CM | POA: Insufficient documentation

## 2014-11-06 DIAGNOSIS — I739 Peripheral vascular disease, unspecified: Secondary | ICD-10-CM

## 2014-11-06 DIAGNOSIS — I2510 Atherosclerotic heart disease of native coronary artery without angina pectoris: Secondary | ICD-10-CM | POA: Diagnosis not present

## 2014-11-06 DIAGNOSIS — Z87891 Personal history of nicotine dependence: Secondary | ICD-10-CM | POA: Diagnosis not present

## 2014-11-06 DIAGNOSIS — Z951 Presence of aortocoronary bypass graft: Secondary | ICD-10-CM | POA: Diagnosis not present

## 2014-11-08 ENCOUNTER — Telehealth: Payer: Self-pay | Admitting: Cardiology

## 2014-11-08 NOTE — Telephone Encounter (Signed)
Informed pt of LE ART results. Scheduled pt next available with Dr. [name] 11/22. Pt verbalized understanding and was in agreement with this plan.3 Pt would like to be seen sooner if at all possible but understands if unable to. Advised pt that I will route message to Lauren, Dr. Tyrell Antonio nurse in the event that pt is able to be seen sooner. Pt very appreciative.

## 2014-11-08 NOTE — Telephone Encounter (Signed)
Patient call

## 2014-12-04 ENCOUNTER — Encounter: Payer: Self-pay | Admitting: Cardiovascular Disease

## 2014-12-04 ENCOUNTER — Ambulatory Visit (INDEPENDENT_AMBULATORY_CARE_PROVIDER_SITE_OTHER): Payer: Medicare Other | Admitting: Cardiovascular Disease

## 2014-12-04 VITALS — BP 154/78 | HR 89 | Ht 67.5 in | Wt 153.8 lb

## 2014-12-04 DIAGNOSIS — E7800 Pure hypercholesterolemia, unspecified: Secondary | ICD-10-CM | POA: Diagnosis not present

## 2014-12-04 DIAGNOSIS — I2510 Atherosclerotic heart disease of native coronary artery without angina pectoris: Secondary | ICD-10-CM | POA: Diagnosis not present

## 2014-12-04 DIAGNOSIS — I10 Essential (primary) hypertension: Secondary | ICD-10-CM

## 2014-12-04 DIAGNOSIS — I739 Peripheral vascular disease, unspecified: Secondary | ICD-10-CM | POA: Diagnosis not present

## 2014-12-04 LAB — BASIC METABOLIC PANEL
BUN: 12 mg/dL (ref 7–25)
CHLORIDE: 102 mmol/L (ref 98–110)
CO2: 24 mmol/L (ref 20–31)
CREATININE: 0.94 mg/dL (ref 0.50–0.99)
Calcium: 9.8 mg/dL (ref 8.6–10.4)
Glucose, Bld: 138 mg/dL — ABNORMAL HIGH (ref 65–99)
Potassium: 4 mmol/L (ref 3.5–5.3)
Sodium: 138 mmol/L (ref 135–146)

## 2014-12-04 LAB — CBC
HCT: 41.6 % (ref 36.0–46.0)
Hemoglobin: 13.9 g/dL (ref 12.0–15.0)
MCH: 29.9 pg (ref 26.0–34.0)
MCHC: 33.4 g/dL (ref 30.0–36.0)
MCV: 89.5 fL (ref 78.0–100.0)
MPV: 10.7 fL (ref 8.6–12.4)
PLATELETS: 295 10*3/uL (ref 150–400)
RBC: 4.65 MIL/uL (ref 3.87–5.11)
RDW: 13.2 % (ref 11.5–15.5)
WBC: 9.2 10*3/uL (ref 4.0–10.5)

## 2014-12-04 NOTE — Assessment & Plan Note (Signed)
Previous CABG in 2006. Currently she has no anginal symptoms.

## 2014-12-04 NOTE — Assessment & Plan Note (Signed)
The patient has severe lifestyle limiting claudication affecting both calves worse on the left side with moderately reduced ABI. She also might have rest pain affecting the left leg without ulceration. Duplex showed significant popliteal and tibial disease. Given the severity of her symptoms, I recommend proceeding with abdominal aortogram, lower extremity runoff and possible endovascular intervention. Cilostazol can be considered based on the results if revascularization is not pursued. Plan access via the right femoral artery.

## 2014-12-04 NOTE — Assessment & Plan Note (Signed)
Lab Results  Component Value Date   CHOL 92 09/10/2014   HDL 29.70* 09/10/2014   LDLCALC 49 09/10/2014   TRIG 64.0 09/10/2014   CHOLHDL 3 09/10/2014   Continue treatment with simvastatin.

## 2014-12-04 NOTE — Progress Notes (Signed)
Primary cardiologist: Dr. Aundra Dubin.  HPI  69 yo female who was referred by Dr. Aundra Dubin for evaluation and management of peripheral arterial disease. She has known history of CAD s/p CABG in 2006, pulmonary embolism in 2014, breast cancer status post left mastectomy in 2011, hyperlipidemia, type 2 diabetes and hypertension. She is not a smoker. She has noticed bilateral calf pain much worse on the left side than the right side which started about 2 years ago and was initially mild and has been progressive since then. Currently, the pain is happening with walking about 25 yards and she has to rest for few minutes before she can resume. She also describes severe left leg cramping at rest at night. She has no lower extremity ulceration. She had noninvasive vascular evaluation which showed an ABI of 0.63 on the right and 0.59 on the left.  Allergies  Allergen Reactions  . Niacin Other (See Comments)    burning     Current Outpatient Prescriptions on File Prior to Visit  Medication Sig Dispense Refill  . acetaminophen (TYLENOL) 500 MG tablet Take 1,000 mg by mouth every 6 (six) hours as needed for pain.    [name] ALPRAZolam (XANAX) 0.25 MG tablet Take 0.25 mg by mouth 3 (three) times daily as needed. For anxiety    . aspirin EC 81 MG tablet Take 1 tablet (81 mg total) by mouth daily. 90 tablet 0  . Brimonidine Tartrate-Timolol (COMBIGAN OP) Apply to eye 2 (two) times daily.    . carvedilol (COREG) 6.25 MG tablet Take 6.25 mg by mouth 2 (two) times daily with a meal.    . dorzolamide (TRUSOPT) 2 % ophthalmic solution Place 1 drop into the right eye 2 (two) times daily.    [name] glimepiride (AMARYL) 2 MG tablet Take 2 mg by mouth at bedtime.     . hydroxypropyl methylcellulose (ISOPTO TEARS) 2.5 % ophthalmic solution Place 1 drop into both eyes as needed for dry eyes.     . metFORMIN (GLUCOPHAGE-XR) 500 MG 24 hr tablet Take 1,000 mg by mouth 2 (two) times daily.      . nitroGLYCERIN (NITROSTAT) 0.4 MG SL  tablet Place 1 tablet (0.4 mg total) under the tongue every 5 (five) minutes as needed for chest pain. 25 tablet 3  . sertraline (ZOLOFT) 50 MG tablet Take 50 mg by mouth daily.      . simvastatin (ZOCOR) 20 MG tablet Take 20 mg by mouth daily.     Current Facility-Administered Medications on File Prior to Visit  Medication Dose Route Frequency Provider Last Rate Last Dose  . aminophylline injection 75 mg  75 mg Intravenous BID PRN Larey Dresser, MD   75 mg at 08/08/14 1335     Past Medical History  Diagnosis Date  . Coronary artery disease   . Hypercholesteremia   . Hypertension   . Diabetes mellitus   . Reflux esophagitis   . Depression   . GERD (gastroesophageal reflux disease)   . Acute MI ([place])   . Cancer ([place])     breast  . Pulmonary embolism ([place])   . Hx of tobacco use, presenting hazards to health 12/21/2013     Past Surgical History  Procedure Laterality Date  . Coronary artery bypass graft      x4  . Ankle surgery      left  . Cholecystectomy    . Cataract extraction, bilateral    . Right renal detachment surgery    . Eye  surgery      right  . Mastectomy       Family History  Problem Relation Age of Onset  . Coronary artery disease Mother 23    status post CABG   . Heart attack Father 32    deceased secondary to fatal  first myocardial infarction  . Coronary artery disease Sister 36    alive had bypass surgery  . Cancer Sister   . Deep vein thrombosis Cousin     Neice had after knee surgery     Social History   Social History  . Marital Status: Married    Spouse Name: N/A  . Number of Children: N/A  . Years of Education: N/A   Occupational History  . Not on file.   Social History Main Topics  . Smoking status: Former Smoker    Quit date: 01/13/2004  . Smokeless tobacco: Not on file  . Alcohol Use: No  . Drug Use: No  . Sexual Activity: Not Currently   Other Topics Concern  . Not on file   Social History Narrative     ROS A  10 point review of system was performed. It is negative other than that mentioned in the history of present illness.   PHYSICAL EXAM   BP 154/78 mmHg  Pulse 89  Ht 5' 7.5" (1.715 m)  Wt 153 lb 12.8 oz (69.763 kg)  BMI 23.72 kg/m2  SpO2 98% Constitutional: She is oriented to person, place, and time. She appears well-developed and well-nourished. No distress.  HENT: No nasal discharge.  Head: Normocephalic and atraumatic.  Eyes: Pupils are equal and round. No discharge.  Neck: Normal range of motion. Neck supple. No JVD present. No thyromegaly present.  Cardiovascular: Normal rate, regular rhythm, normal heart sounds. Exam reveals no gallop and no friction rub. No murmur heard.  Pulmonary/Chest: Effort normal and breath sounds normal. No stridor. No respiratory distress. She has no wheezes. She has no rales. She exhibits no tenderness.  Abdominal: Soft. Bowel sounds are normal. She exhibits no distension. There is no tenderness. There is no rebound and no guarding.  Musculoskeletal: Normal range of motion. She exhibits no edema and no tenderness.  Neurological: She is alert and oriented to person, place, and time. Coordination normal.  Skin: Skin is warm and dry. No rash noted. She is not diaphoretic. No erythema. No pallor.  Psychiatric: She has a normal mood and affect. Her behavior is normal. Judgment and thought content normal.  Vascular: Femoral pulses are normal. Distal pulses are not palpable.     ASSESSMENT AND PLAN

## 2014-12-04 NOTE — Patient Instructions (Signed)
Medication Instructions:  Your physician recommends that you continue on your current medications as directed. Please refer to the Current Medication list given to you today.  Labwork: Your physician recommends that you have lab work today: BMP, CBC and PT/INR  Testing/Procedures: Your physician has requested that you have a peripheral vascular angiogram. This exam is performed at the hospital. During this exam IV contrast is used to look at arterial blood flow. Please review the information sheet given for details.  Follow-Up: Your physician recommends that you schedule a follow-up appointment in: 2-3 WEEKS with Dr Arida   Any Other Special Instructions Will Be Listed Below (If Applicable).     If you need a refill on your cardiac medications before your next appointment, please call your pharmacy.   

## 2014-12-05 LAB — PROTIME-INR
INR: 0.93 (ref <1.50)
Prothrombin Time: 12.6 seconds (ref 11.6–15.2)

## 2014-12-12 ENCOUNTER — Encounter (HOSPITAL_COMMUNITY)
Admission: RE | Disposition: A | Discharge status: Home / Self Care | Payer: Self-pay | Source: Ambulatory Visit | Attending: Cardiovascular Disease

## 2014-12-12 ENCOUNTER — Other Ambulatory Visit: Payer: Self-pay | Admitting: Cardiovascular Disease

## 2014-12-12 ENCOUNTER — Ambulatory Visit (HOSPITAL_COMMUNITY)
Admission: RE | Admit: 2014-12-12 | Discharge: 2014-12-12 | Disposition: A | Discharge status: Home / Self Care | Payer: Medicare Other | Source: Ambulatory Visit | Attending: Cardiovascular Disease | Admitting: Cardiovascular Disease

## 2014-12-12 ENCOUNTER — Encounter (HOSPITAL_COMMUNITY): Payer: Self-pay | Admitting: *Deleted

## 2014-12-12 DIAGNOSIS — E7800 Pure hypercholesterolemia, unspecified: Secondary | ICD-10-CM | POA: Insufficient documentation

## 2014-12-12 DIAGNOSIS — I252 Old myocardial infarction: Secondary | ICD-10-CM | POA: Insufficient documentation

## 2014-12-12 DIAGNOSIS — Z7984 Long term (current) use of oral hypoglycemic drugs: Secondary | ICD-10-CM | POA: Diagnosis not present

## 2014-12-12 DIAGNOSIS — E119 Type 2 diabetes mellitus without complications: Secondary | ICD-10-CM | POA: Diagnosis not present

## 2014-12-12 DIAGNOSIS — Z8249 Family history of ischemic heart disease and other diseases of the circulatory system: Secondary | ICD-10-CM | POA: Diagnosis not present

## 2014-12-12 DIAGNOSIS — Z951 Presence of aortocoronary bypass graft: Secondary | ICD-10-CM | POA: Diagnosis not present

## 2014-12-12 DIAGNOSIS — Z7982 Long term (current) use of aspirin: Secondary | ICD-10-CM | POA: Insufficient documentation

## 2014-12-12 DIAGNOSIS — F329 Major depressive disorder, single episode, unspecified: Secondary | ICD-10-CM | POA: Diagnosis not present

## 2014-12-12 DIAGNOSIS — Z853 Personal history of malignant neoplasm of breast: Secondary | ICD-10-CM | POA: Insufficient documentation

## 2014-12-12 DIAGNOSIS — Z87891 Personal history of nicotine dependence: Secondary | ICD-10-CM | POA: Diagnosis not present

## 2014-12-12 DIAGNOSIS — I739 Peripheral vascular disease, unspecified: Secondary | ICD-10-CM

## 2014-12-12 DIAGNOSIS — I2510 Atherosclerotic heart disease of native coronary artery without angina pectoris: Secondary | ICD-10-CM | POA: Insufficient documentation

## 2014-12-12 DIAGNOSIS — K210 Gastro-esophageal reflux disease with esophagitis: Secondary | ICD-10-CM | POA: Insufficient documentation

## 2014-12-12 DIAGNOSIS — I10 Essential (primary) hypertension: Secondary | ICD-10-CM | POA: Insufficient documentation

## 2014-12-12 DIAGNOSIS — Z86711 Personal history of pulmonary embolism: Secondary | ICD-10-CM | POA: Diagnosis not present

## 2014-12-12 DIAGNOSIS — I701 Atherosclerosis of renal artery: Secondary | ICD-10-CM | POA: Diagnosis not present

## 2014-12-12 HISTORY — PX: PERIPHERAL VASCULAR CATHETERIZATION: SHX172C

## 2014-12-12 LAB — GLUCOSE, CAPILLARY
Glucose-Capillary: 119 mg/dL — ABNORMAL HIGH (ref 65–99)
Glucose-Capillary: 97 mg/dL (ref 65–99)

## 2014-12-12 SURGERY — ABDOMINAL AORTOGRAM

## 2014-12-12 MED ORDER — SODIUM CHLORIDE 0.9 % IJ SOLN: 3.0000 mL | INTRAMUSCULAR | Status: DC | PRN

## 2014-12-12 MED ORDER — IODIXANOL 320 MG/ML IV SOLN
INTRAVENOUS | Status: DC | PRN
  Administered 2014-12-12: 100 mL via INTRA_ARTERIAL

## 2014-12-12 MED ORDER — SODIUM CHLORIDE 0.9 % WEIGHT BASED INFUSION: 1.0000 mL/kg/h | INTRAVENOUS | Status: DC

## 2014-12-12 MED ORDER — CILOSTAZOL 50 MG PO TABS: 50.0000 mg | ORAL_TABLET | Freq: Two times a day (BID) | ORAL | Status: DC

## 2014-12-12 MED ORDER — HEPARIN (PORCINE) IN NACL 2-0.9 UNIT/ML-% IJ SOLN
INTRAMUSCULAR | Status: DC | PRN
  Administered 2014-12-12: 12:00:00

## 2014-12-12 MED ORDER — FENTANYL CITRATE (PF) 100 MCG/2ML IJ SOLN
INTRAMUSCULAR | Status: AC
  Filled 2014-12-12: qty 2

## 2014-12-12 MED ORDER — MIDAZOLAM HCL 2 MG/2ML IJ SOLN
INTRAMUSCULAR | Status: DC | PRN
  Administered 2014-12-12: 1 mg via INTRAVENOUS

## 2014-12-12 MED ORDER — FENTANYL CITRATE (PF) 100 MCG/2ML IJ SOLN
INTRAMUSCULAR | Status: DC | PRN
  Administered 2014-12-12: 25 ug via INTRAVENOUS

## 2014-12-12 MED ORDER — HEPARIN (PORCINE) IN NACL 2-0.9 UNIT/ML-% IJ SOLN
INTRAMUSCULAR | Status: AC
  Filled 2014-12-12: qty 1000

## 2014-12-12 MED ORDER — SODIUM CHLORIDE 0.9 % IJ SOLN: 3.0000 mL | Freq: Two times a day (BID) | INTRAMUSCULAR | Status: DC

## 2014-12-12 MED ORDER — SODIUM CHLORIDE 0.9 % IV SOLN: 250.0000 mL | INTRAVENOUS | Status: DC | PRN

## 2014-12-12 MED ORDER — SODIUM CHLORIDE 0.9 % WEIGHT BASED INFUSION
3.0000 mL/kg/h | INTRAVENOUS | Status: DC
  Administered 2014-12-12: 3 mL/kg/h via INTRAVENOUS

## 2014-12-12 MED ORDER — ASPIRIN 81 MG PO CHEW: 81.0000 mg | CHEWABLE_TABLET | ORAL | Status: DC

## 2014-12-12 MED ORDER — LIDOCAINE HCL (PF) 1 % IJ SOLN
INTRAMUSCULAR | Status: AC
  Filled 2014-12-12: qty 30

## 2014-12-12 MED ORDER — MIDAZOLAM HCL 2 MG/2ML IJ SOLN
INTRAMUSCULAR | Status: AC
  Filled 2014-12-12: qty 2

## 2014-12-12 SURGICAL SUPPLY — 9 items
CATH ANGIO 5F PIGTAIL 65CM (CATHETERS) ×2 IMPLANT
DEVICE CLOSURE MYNXGRIP 5F (Vascular Products) ×2 IMPLANT
KIT PV (KITS) ×3 IMPLANT
SHEATH PINNACLE 5F 10CM (SHEATH) ×2 IMPLANT
STOPCOCK MORSE 400PSI 3WAY (MISCELLANEOUS) ×2 IMPLANT
SYR MEDRAD MARK V 150ML (SYRINGE) ×3 IMPLANT
TRANSDUCER W/STOPCOCK (MISCELLANEOUS) ×3 IMPLANT
TRAY PV CATH (CUSTOM PROCEDURE TRAY) ×3 IMPLANT
WIRE HITORQ VERSACORE ST 145CM (WIRE) ×2 IMPLANT

## 2014-12-12 NOTE — Discharge Instructions (Signed)
Resume Metformin in 2 days.  Start taking Cilostazol (Pletal ) 50 mg twice daily. A prescription was sent to your [facility]. Angiogram, Care After Refer to this sheet in the next few weeks. These instructions provide you with information about caring for yourself after your procedure. Your health care provider may also give you more specific instructions. Your treatment has been planned according to current medical practices, but problems sometimes occur. Call your health care provider if you have any problems or questions after your procedure. WHAT TO EXPECT AFTER THE PROCEDURE After your procedure, it is typical to have the following:  Bruising at the catheter insertion site that usually fades within 1-2 weeks.  Blood collecting in the tissue (hematoma) that may be painful to the touch. It should usually decrease in size and tenderness within 1-2 weeks. HOME CARE INSTRUCTIONS  Take medicines only as directed by your health care provider.  You may shower 24-48 hours after the procedure or as directed by your health care provider. Remove the bandage (dressing) and gently wash the site with plain soap and water. Pat the area dry with a clean towel. Do not rub the site, because this may cause bleeding.  Do not take baths, swim, or use a hot tub until your health care provider approves.  Check your insertion site every day for redness, swelling, or drainage.  Do not apply powder or lotion to the site.  Do not lift over 10 lb (4.5 kg) for 5 days after your procedure or as directed by your health care provider.  Ask your health care provider when it is okay to:  Return to work or school.  Resume usual physical activities or sports.  Resume sexual activity.  Do not drive home if you are discharged the same day as the procedure. Have someone else drive you.  You may drive 24 hours after the procedure unless otherwise instructed by your health care provider.  Do not operate  machinery or power tools for 24 hours after the procedure or as directed by your health care provider.  If your procedure was done as an outpatient procedure, which means that you went home the same day as your procedure, a responsible adult should be with you for the first 24 hours after you arrive home.  Keep all follow-up visits as directed by your health care provider. This is important. SEEK MEDICAL CARE IF:  You have a fever.  You have chills.  You have increased bleeding from the catheter insertion site. Hold pressure on the site. SEEK IMMEDIATE MEDICAL CARE IF:  You have unusual pain at the catheter insertion site.  You have redness, warmth, or swelling at the catheter insertion site.  You have drainage (other than a small amount of blood on the dressing) from the catheter insertion site.  The catheter insertion site is bleeding, and the bleeding does not stop after 30 minutes of holding steady pressure on the site.  The area near or just beyond the catheter insertion site becomes pale, cool, tingly, or numb.   This information is not intended to replace advice given to you by your health care provider. Make sure you discuss any questions you have with your health care provider.   Document Released: 07/17/2004 Document Revised: 01/19/2014 Document Reviewed: 06/01/2012 Elsevier Interactive Patient Education [employer].

## 2014-12-12 NOTE — H&P (View-Only) (Signed)
Primary cardiologist: Dr. Aundra Dubin.  HPI  69 yo female who was referred by Dr. Aundra Dubin for evaluation and management of peripheral arterial disease. She has known history of CAD s/p CABG in 2006, pulmonary embolism in 2014, breast cancer status post left mastectomy in 2011, hyperlipidemia, type 2 diabetes and hypertension. She is not a smoker. She has noticed bilateral calf pain much worse on the left side than the right side which started about 2 years ago and was initially mild and has been progressive since then. Currently, the pain is happening with walking about 25 yards and she has to rest for few minutes before she can resume. She also describes severe left leg cramping at rest at night. She has no lower extremity ulceration. She had noninvasive vascular evaluation which showed an ABI of 0.63 on the right and 0.59 on the left.  Allergies  Allergen Reactions  . Niacin Other (See Comments)    burning     Current Outpatient Prescriptions on File Prior to Visit  Medication Sig Dispense Refill  . acetaminophen (TYLENOL) 500 MG tablet Take 1,000 mg by mouth every 6 (six) hours as needed for pain.    [name] ALPRAZolam (XANAX) 0.25 MG tablet Take 0.25 mg by mouth 3 (three) times daily as needed. For anxiety    . aspirin EC 81 MG tablet Take 1 tablet (81 mg total) by mouth daily. 90 tablet 0  . Brimonidine Tartrate-Timolol (COMBIGAN OP) Apply to eye 2 (two) times daily.    . carvedilol (COREG) 6.25 MG tablet Take 6.25 mg by mouth 2 (two) times daily with a meal.    . dorzolamide (TRUSOPT) 2 % ophthalmic solution Place 1 drop into the right eye 2 (two) times daily.    [name] glimepiride (AMARYL) 2 MG tablet Take 2 mg by mouth at bedtime.     . hydroxypropyl methylcellulose (ISOPTO TEARS) 2.5 % ophthalmic solution Place 1 drop into both eyes as needed for dry eyes.     . metFORMIN (GLUCOPHAGE-XR) 500 MG 24 hr tablet Take 1,000 mg by mouth 2 (two) times daily.      . nitroGLYCERIN (NITROSTAT) 0.4 MG SL  tablet Place 1 tablet (0.4 mg total) under the tongue every 5 (five) minutes as needed for chest pain. 25 tablet 3  . sertraline (ZOLOFT) 50 MG tablet Take 50 mg by mouth daily.      . simvastatin (ZOCOR) 20 MG tablet Take 20 mg by mouth daily.     Current Facility-Administered Medications on File Prior to Visit  Medication Dose Route Frequency Provider Last Rate Last Dose  . aminophylline injection 75 mg  75 mg Intravenous BID PRN Larey Dresser, MD   75 mg at 08/08/14 1335     Past Medical History  Diagnosis Date  . Coronary artery disease   . Hypercholesteremia   . Hypertension   . Diabetes mellitus   . Reflux esophagitis   . Depression   . GERD (gastroesophageal reflux disease)   . Acute MI ([place])   . Cancer ([facility])     breast  . Pulmonary embolism ([place])   . Hx of tobacco use, presenting hazards to health 12/21/2013     Past Surgical History  Procedure Laterality Date  . Coronary artery bypass graft      x4  . Ankle surgery      left  . Cholecystectomy    . Cataract extraction, bilateral    . Right renal detachment surgery    . Eye  surgery      right  . Mastectomy       Family History  Problem Relation Age of Onset  . Coronary artery disease Mother 33    status post CABG   . Heart attack Father 80    deceased secondary to fatal  first myocardial infarction  . Coronary artery disease Sister 55    alive had bypass surgery  . Cancer Sister   . Deep vein thrombosis Cousin     Neice had after knee surgery     Social History   Social History  . Marital Status: Married    Spouse Name: N/A  . Number of Children: N/A  . Years of Education: N/A   Occupational History  . Not on file.   Social History Main Topics  . Smoking status: Former Smoker    Quit date: 01/13/2004  . Smokeless tobacco: Not on file  . Alcohol Use: No  . Drug Use: No  . Sexual Activity: Not Currently   Other Topics Concern  . Not on file   Social History Narrative     ROS A  10 point review of system was performed. It is negative other than that mentioned in the history of present illness.   PHYSICAL EXAM   BP 154/78 mmHg  Pulse 89  Ht 5' 7.5" (1.715 m)  Wt 153 lb 12.8 oz (69.763 kg)  BMI 23.72 kg/m2  SpO2 98% Constitutional: She is oriented to person, place, and time. She appears well-developed and well-nourished. No distress.  HENT: No nasal discharge.  Head: Normocephalic and atraumatic.  Eyes: Pupils are equal and round. No discharge.  Neck: Normal range of motion. Neck supple. No JVD present. No thyromegaly present.  Cardiovascular: Normal rate, regular rhythm, normal heart sounds. Exam reveals no gallop and no friction rub. No murmur heard.  Pulmonary/Chest: Effort normal and breath sounds normal. No stridor. No respiratory distress. She has no wheezes. She has no rales. She exhibits no tenderness.  Abdominal: Soft. Bowel sounds are normal. She exhibits no distension. There is no tenderness. There is no rebound and no guarding.  Musculoskeletal: Normal range of motion. She exhibits no edema and no tenderness.  Neurological: She is alert and oriented to person, place, and time. Coordination normal.  Skin: Skin is warm and dry. No rash noted. She is not diaphoretic. No erythema. No pallor.  Psychiatric: She has a normal mood and affect. Her behavior is normal. Judgment and thought content normal.  Vascular: Femoral pulses are normal. Distal pulses are not palpable.     ASSESSMENT AND PLAN

## 2014-12-12 NOTE — Interval H&P Note (Signed)
History and Physical Interval Note:  12/12/2014 11:07 AM  Taylor [name]  has presented today for surgery, with the diagnosis of pvd  The various methods of treatment have been discussed with the patient and family. After consideration of risks, benefits and other options for treatment, the patient has consented to  Procedure(s): Abdominal Aortogram (N/A) as a surgical intervention .  The patient's history has been reviewed, patient examined, no change in status, stable for surgery.  I have reviewed the patient's chart and labs.  Questions were answered to the patient's satisfaction.     Kathlyn [place]

## 2014-12-17 ENCOUNTER — Ambulatory Visit (INDEPENDENT_AMBULATORY_CARE_PROVIDER_SITE_OTHER): Payer: Medicare Other | Admitting: Ophthalmology

## 2014-12-17 DIAGNOSIS — I10 Essential (primary) hypertension: Secondary | ICD-10-CM

## 2014-12-17 DIAGNOSIS — H35033 Hypertensive retinopathy, bilateral: Secondary | ICD-10-CM

## 2014-12-17 DIAGNOSIS — H43813 Vitreous degeneration, bilateral: Secondary | ICD-10-CM

## 2014-12-17 DIAGNOSIS — E113591 Type 2 diabetes mellitus with proliferative diabetic retinopathy without macular edema, right eye: Secondary | ICD-10-CM

## 2014-12-17 DIAGNOSIS — E113392 Type 2 diabetes mellitus with moderate nonproliferative diabetic retinopathy without macular edema, left eye: Secondary | ICD-10-CM

## 2014-12-17 DIAGNOSIS — E11319 Type 2 diabetes mellitus with unspecified diabetic retinopathy without macular edema: Secondary | ICD-10-CM

## 2014-12-19 ENCOUNTER — Other Ambulatory Visit: Payer: Self-pay | Admitting: Oncology

## 2014-12-19 ENCOUNTER — Other Ambulatory Visit: Payer: Self-pay

## 2014-12-19 DIAGNOSIS — Z853 Personal history of malignant neoplasm of breast: Secondary | ICD-10-CM

## 2014-12-20 ENCOUNTER — Encounter: Payer: Self-pay | Admitting: Oncology

## 2014-12-20 ENCOUNTER — Other Ambulatory Visit: Payer: Self-pay | Admitting: Oncology

## 2014-12-20 ENCOUNTER — Telehealth: Payer: Self-pay | Admitting: Oncology

## 2014-12-20 ENCOUNTER — Other Ambulatory Visit (HOSPITAL_BASED_OUTPATIENT_CLINIC_OR_DEPARTMENT_OTHER): Payer: Medicare Other

## 2014-12-20 ENCOUNTER — Ambulatory Visit (HOSPITAL_BASED_OUTPATIENT_CLINIC_OR_DEPARTMENT_OTHER): Payer: Medicare Other | Admitting: Oncology

## 2014-12-20 VITALS — BP 155/60 | HR 83 | Temp 98.1°F | Resp 18 | Ht 67.5 in | Wt 157.6 lb

## 2014-12-20 DIAGNOSIS — I10 Essential (primary) hypertension: Secondary | ICD-10-CM | POA: Diagnosis not present

## 2014-12-20 DIAGNOSIS — E119 Type 2 diabetes mellitus without complications: Secondary | ICD-10-CM | POA: Diagnosis not present

## 2014-12-20 DIAGNOSIS — R922 Inconclusive mammogram: Secondary | ICD-10-CM | POA: Diagnosis not present

## 2014-12-20 DIAGNOSIS — Z853 Personal history of malignant neoplasm of breast: Secondary | ICD-10-CM | POA: Diagnosis present

## 2014-12-20 DIAGNOSIS — Z1231 Encounter for screening mammogram for malignant neoplasm of breast: Secondary | ICD-10-CM

## 2014-12-20 DIAGNOSIS — Z86711 Personal history of pulmonary embolism: Secondary | ICD-10-CM | POA: Diagnosis not present

## 2014-12-20 DIAGNOSIS — Z1239 Encounter for other screening for malignant neoplasm of breast: Secondary | ICD-10-CM

## 2014-12-20 LAB — COMPREHENSIVE METABOLIC PANEL
AST: 16 U/L (ref 5–34)
Albumin: 4.1 g/dL (ref 3.5–5.0)
Alkaline Phosphatase: 64 U/L (ref 40–150)
Anion Gap: 13 mEq/L — ABNORMAL HIGH (ref 3–11)
BUN: 13.4 mg/dL (ref 7.0–26.0)
CHLORIDE: 105 meq/L (ref 98–109)
CO2: 23 meq/L (ref 22–29)
CREATININE: 1.1 mg/dL (ref 0.6–1.1)
Calcium: 10.3 mg/dL (ref 8.4–10.4)
EGFR: 50 mL/min/{1.73_m2} — ABNORMAL LOW (ref >90)
GLUCOSE: 163 mg/dL — AB (ref 70–140)
POTASSIUM: 3.8 meq/L (ref 3.5–5.1)
SODIUM: 141 meq/L (ref 136–145)
Total Bilirubin: 0.55 mg/dL (ref 0.20–1.20)
Total Protein: 8.1 g/dL (ref 6.4–8.3)

## 2014-12-20 NOTE — Telephone Encounter (Signed)
Gave patient avs report and appointments for December.  Patient also given appointment for mammo 09/11/15.

## 2014-12-20 NOTE — Progress Notes (Signed)
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 She had right mammogram at [facility] 09-10-14 with heterogeneously dense breast tissue but no other mammographic findings of concern.  She is no longer followed regularly by Dr Lucia Gaskins. She had bilateral PEs 02-2012, with left greater saphenous vein clot documented. She was on coumadin until 04-2013, DCd after 2 unremarkable hypercoag workups.   Patient is not aware of any problems with left mastectomy or changes in right breast.   She has had sinus symptoms x 2 weeks, improving without intervention, grandson and son with similar symptoms. She denies fever, productive cough, SOB, ear pain. She has clear rhinorrhea.  She has had evaluations for peripheral vascular disease by Dr [name], including LE, carotids and abdominal aortogram, with findings of significant left renal artery stenosis and bilateral popliteal artery occlusion behind knee. She started medical therapy including ASA 81 mg daily and is to see Dr [name] on 01-01-15. She describes "lower legs giving out" with exertion such as walking partway across store, improves with brief rest.  No new or different pain otherwise. Appetite had been decreased x 9 months due to social stressors (helping son care for 87 yo grandson when he obtained custody) but better now. Bowels ok, no bladder symptoms. No swelling LE. No chest pain. Tinnitus x several years bilaterally. No abdominal or pelvic pain. No bleeding. 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 She declined hormonal blocker and has been followed with observation  Objective:  Vital signs in last 24 hours:  BP 155/60 mmHg  Pulse 83  Temp(Src) 98.1 F (36.7 C) (Oral)  Resp 18  Ht 5' 7.5" (1.715 m)  Wt 157 lb 9.6 oz (71.487 kg)  BMI 24.31 kg/m2  SpO2 99%  weight was 163 lbs at this office a year ago Alert, oriented and appropriate. Ambulatory without difficulty. Respirations not labored RA. Sounds slightly nasally congested, does not appear acutely ill   HEENT:PERRL, sclerae not icteric. Oral mucosa moist without lesions, posterior pharynx minimal dull erythema without exudate. Nasal turbinates boggy without purulent drainage. TMs dull bilaterally without erythema or bulging.  Neck supple. No JVD.  Lymphatics:no cervical,supraclavicular, axillary or inguinal adenopathy Resp: clear to auscultation bilaterally and normal percussion bilaterally Cardio: regular rate and rhythm. No gallop. GI: soft, nontender, not distended, no mass or organomegaly. Normally active bowel sounds.  Musculoskeletal/ Extremities: Back nontender. No swelling UE. LE without pitting edema, cords, tenderness Neuro:  nonfocal  Psych appropriate mood and affect Skin without rash, ecchymosis, petechiae Breasts:Left mastectomy scar well healed without evidence of local recurrence. RIght without dominant mass, skin or nipple findings. Axillae benign.  Lab Results:  Results for orders placed or performed in visit on 12/20/14  Comprehensive metabolic panel  Result Value Ref Range   Sodium 141 136 - 145 mEq/L   Potassium 3.8 3.5 - 5.1 mEq/L   Chloride 105 98 - 109 mEq/L   CO2 23 22 - 29 mEq/L   Glucose 163 (H) 70 - 140 mg/dl   BUN 13.4 7.0 - 26.0 mg/dL  Creatinine 1.1 0.6 - 1.1 mg/dL   Total Bilirubin 0.55 0.20 - 1.20 mg/dL   Alkaline Phosphatase 64 40 - 150 U/L   AST 16 5 - 34 U/L   ALT <9 0 - 55 U/L   Total Protein 8.1 6.4 - 8.3 g/dL   Albumin 4.1 3.5 - 5.0 g/dL   Calcium 10.3 8.4  - 10.4 mg/dL   Anion Gap 13 (H) 3 - 11 mEq/L   EGFR 50 (L) >90 ml/min/1.73 m2    CBC from 12-04-14   WBC 9.2, Hgb 13.9, plt 295  EXAM: DIGITAL SCREENING UNILATERAL RIGHT MAMMOGRAM WITH CAD  COMPARISON: Previous exam(s).  ACR Breast Density Category c: The breast tissue is heterogeneously dense, which may obscure small masses.  FINDINGS: There are no findings suspicious for malignancy. There has been a previous left mastectomy. Images were processed with CAD.  IMPRESSION: No mammographic evidence of malignancy. A result letter of this screening mammogram will be mailed directly to the patient.  RECOMMENDATION: Screening mammogram in one year. (Code:[id number])  BI-RADS CATEGORY 1: Negative.    Medications: I have reviewed the patient's current medications. She plans to get flu vaccine when this respiratory illness is resolved.   DISCUSSION Recommended again that she have 3D tomo mammograms due to better imaging in very dense breast tissue.   Assessment/Plan:  1. DCIS left breast: ER PR +, post left mastectomy, on observation. Needs yearly right mammogram as tomo 3D due to dense breast tissue. She prefers follow up here in a year 2.CAD and peripheral vascular disease including renal arteries, HTN, elevated lipids. 3. PEs 02-2012 and superficial LE thrombosis then. Hypercoagulable evaluations x at least 2 negative, off coumadin. 4. 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 Appetite and po intake better now. 8.viral URI symptoms: no indication for antibiotic, suggested benadryl at hs as cannot use decongestants with HTN 9.needs flu vaccine after present URI resolved  All questions answered. Time spent 20 min including >50% counseling and coordination of care. Cc Dr Ashok Cordia,  MD   12/20/2014, 3:45 PM

## 2014-12-20 NOTE — Patient Instructions (Signed)
Your breasts are very dense, which can make mammograms more difficult to read. 3D or "tomo" mammograms give best images with very dense breast tissue.

## 2014-12-28 DIAGNOSIS — J329 Chronic sinusitis, unspecified: Secondary | ICD-10-CM | POA: Diagnosis not present

## 2015-01-01 ENCOUNTER — Ambulatory Visit (INDEPENDENT_AMBULATORY_CARE_PROVIDER_SITE_OTHER): Payer: Medicare Other | Admitting: Cardiovascular Disease

## 2015-01-01 ENCOUNTER — Encounter: Payer: Self-pay | Admitting: Cardiovascular Disease

## 2015-01-01 VITALS — BP 130/70 | HR 76 | Ht 67.5 in | Wt 158.1 lb

## 2015-01-01 DIAGNOSIS — I739 Peripheral vascular disease, unspecified: Secondary | ICD-10-CM | POA: Diagnosis not present

## 2015-01-01 DIAGNOSIS — I2510 Atherosclerotic heart disease of native coronary artery without angina pectoris: Secondary | ICD-10-CM

## 2015-01-01 NOTE — Progress Notes (Signed)
Primary cardiologist: Dr. Aundra Dubin.  HPI  69 yo female who was is here today for a follow-up visit regarding peripheral arterial disease. She has known history of CAD s/p CABG in 2006, pulmonary embolism in 2014, breast cancer status post left mastectomy in 2011, hyperlipidemia, type 2 diabetes and hypertension. She is not a smoker. She was seen recently for bilateral calf pain much worse on the left side than the right side which started about 2 years ago. She had noninvasive vascular evaluation which showed an ABI of 0.63 on the right and 0.59 on the left. I proceeded with angiography which showed bilateral popliteal artery occlusion . The occlusion on the left is short with reconstitution via collaterals in the distal popliteal artery with 2 vessel runoff below the knee. On the right side, the occlusion is longer with reconstitution in the proximal tibial and peroneal arteries. I started the patient on cilostazol. She reports having sinusitis for the last few weeks and currently on amoxicillin. She hasn't been able to do much physical activities because of fatigue but she doesn't know if she is responding to cilostazol normal.  Allergies  Allergen Reactions  . Niacin Other (See Comments)    burning     Current Outpatient Prescriptions on File Prior to Visit  Medication Sig Dispense Refill  . acetaminophen (TYLENOL) 500 MG tablet Take 1,000 mg by mouth every 6 (six) hours as needed for pain.    [name] ALPRAZolam (XANAX) 0.25 MG tablet Take 0.25 mg by mouth 3 (three) times daily as needed. For anxiety    . aspirin EC 81 MG tablet Take 1 tablet (81 mg total) by mouth daily. 90 tablet 0  . Brimonidine Tartrate-Timolol (COMBIGAN OP) Place 1 drop into both eyes 2 (two) times daily.     . carvedilol (COREG) 6.25 MG tablet Take 6.25 mg by mouth 2 (two) times daily with a meal.    . cilostazol (PLETAL) 50 MG tablet Take 1 tablet (50 mg total) by mouth 2 (two) times daily. 60 tablet 3  . dorzolamide  (TRUSOPT) 2 % ophthalmic solution Place 1 drop into the right eye 2 (two) times daily.    [name] glimepiride (AMARYL) 2 MG tablet Take 2 mg by mouth daily with breakfast.     . hydroxypropyl methylcellulose (ISOPTO TEARS) 2.5 % ophthalmic solution Place 1 drop into both eyes as needed for dry eyes.     . nitroGLYCERIN (NITROSTAT) 0.4 MG SL tablet Place 1 tablet (0.4 mg total) under the tongue every 5 (five) minutes as needed for chest pain. 25 tablet 3  . sertraline (ZOLOFT) 50 MG tablet Take 50 mg by mouth daily.      . simvastatin (ZOCOR) 20 MG tablet Take 20 mg by mouth daily.     Current Facility-Administered Medications on File Prior to Visit  Medication Dose Route Frequency Provider Last Rate Last Dose  . aminophylline injection 75 mg  75 mg Intravenous BID PRN Larey Dresser, MD   75 mg at 08/08/14 1335     Past Medical History  Diagnosis Date  . Coronary artery disease   . Hypercholesteremia   . Hypertension   . Diabetes mellitus   . Reflux esophagitis   . Depression   . GERD (gastroesophageal reflux disease)   . Acute MI ([place])   . Cancer ([place])     breast  . Pulmonary embolism ([place])   . Hx of tobacco use, presenting hazards to health 12/21/2013     Past Surgical  History  Procedure Laterality Date  . Coronary artery bypass graft      x4  . Ankle surgery      left  . Cholecystectomy    . Cataract extraction, bilateral    . Right renal detachment surgery    . Eye surgery      right  . Mastectomy    . Peripheral vascular catheterization N/A 12/12/2014    Procedure: Abdominal Aortogram;  Surgeon: Wellington Hampshire, MD;  Location: [facility];  Service: Cardiovascular;  Laterality: N/A;     Family History  Problem Relation Age of Onset  . Coronary artery disease Mother 2    status post CABG   . Heart attack Father 31    deceased secondary to fatal  first myocardial infarction  . Coronary artery disease Sister 82    alive had bypass surgery  . Cancer Sister    . Deep vein thrombosis Cousin     Neice had after knee surgery     Social History   Social History  . Marital Status: Married    Spouse Name: N/A  . Number of Children: N/A  . Years of Education: N/A   Occupational History  . Not on file.   Social History Main Topics  . Smoking status: Former Smoker    Quit date: 01/13/2004  . Smokeless tobacco: Not on file  . Alcohol Use: No  . Drug Use: No  . Sexual Activity: Not Currently   Other Topics Concern  . Not on file   Social History Narrative     ROS A 10 point review of system was performed. It is negative other than that mentioned in the history of present illness.   PHYSICAL EXAM   BP 130/70 mmHg  Pulse 76  Ht 5' 7.5" (1.715 m)  Wt 158 lb 1.9 oz (71.723 kg)  BMI 24.39 kg/m2 Constitutional: She is oriented to person, place, and time. She appears well-developed and well-nourished. No distress.  HENT: No nasal discharge.  Head: Normocephalic and atraumatic.  Eyes: Pupils are equal and round. No discharge.  Neck: Normal range of motion. Neck supple. No JVD present. No thyromegaly present.  Cardiovascular: Normal rate, regular rhythm, normal heart sounds. Exam reveals no gallop and no friction rub. No murmur heard.  Pulmonary/Chest: Effort normal and breath sounds normal. No stridor. No respiratory distress. She has no wheezes. She has no rales. She exhibits no tenderness.  Abdominal: Soft. Bowel sounds are normal. She exhibits no distension. There is no tenderness. There is no rebound and no guarding.  Musculoskeletal: Normal range of motion. She exhibits no edema and no tenderness.  Neurological: She is alert and oriented to person, place, and time. Coordination normal.  Skin: Skin is warm and dry. No rash noted. She is not diaphoretic. No erythema. No pallor.  Psychiatric: She has a normal mood and affect. Her behavior is normal. Judgment and thought content normal.  Vascular: Femoral pulses are normal. Distal  pulses are not palpable. No groin hematoma.    ASSESSMENT AND PLAN

## 2015-01-01 NOTE — Assessment & Plan Note (Signed)
The patient has moderate to severe bilateral calf claudication due to popliteal artery occlusion bilaterally. The location is not optimal for endovascular intervention and given that there is no evidence of critical limb ischemia, I recommended attempted medical therapy first before considering revascularization. She was started on cilostazol but she hasn't been doing much physical activities due to recent illness. I made no changes today and we will reevaluate symptoms in 6 months. I advised her to start a walking program.

## 2015-01-01 NOTE — Patient Instructions (Signed)

## 2015-01-23 DIAGNOSIS — Z23 Encounter for immunization: Secondary | ICD-10-CM | POA: Diagnosis not present

## 2015-01-28 DIAGNOSIS — F419 Anxiety disorder, unspecified: Secondary | ICD-10-CM | POA: Diagnosis not present

## 2015-01-28 DIAGNOSIS — I739 Peripheral vascular disease, unspecified: Secondary | ICD-10-CM | POA: Diagnosis not present

## 2015-01-28 DIAGNOSIS — R0981 Nasal congestion: Secondary | ICD-10-CM | POA: Diagnosis not present

## 2015-01-28 DIAGNOSIS — Z7984 Long term (current) use of oral hypoglycemic drugs: Secondary | ICD-10-CM | POA: Diagnosis not present

## 2015-01-28 DIAGNOSIS — E1165 Type 2 diabetes mellitus with hyperglycemia: Secondary | ICD-10-CM | POA: Diagnosis not present

## 2015-02-21 ENCOUNTER — Inpatient Hospital Stay (HOSPITAL_COMMUNITY)
Admission: EM | Admit: 2015-02-21 | Discharge: 2015-02-22 | DRG: 247 | Disposition: A | Discharge status: Home / Self Care | Payer: Medicare Other | Attending: Cardiovascular Disease | Admitting: Cardiovascular Disease

## 2015-02-21 ENCOUNTER — Encounter (HOSPITAL_COMMUNITY)
Admission: EM | Disposition: A | Discharge status: Home / Self Care | Payer: Self-pay | Source: Home / Self Care | Attending: Cardiovascular Disease

## 2015-02-21 ENCOUNTER — Emergency Department (HOSPITAL_COMMUNITY): Payer: Medicare Other

## 2015-02-21 ENCOUNTER — Encounter (HOSPITAL_COMMUNITY): Payer: Self-pay

## 2015-02-21 DIAGNOSIS — IMO0002 Reserved for concepts with insufficient information to code with codable children: Secondary | ICD-10-CM | POA: Diagnosis present

## 2015-02-21 DIAGNOSIS — E1051 Type 1 diabetes mellitus with diabetic peripheral angiopathy without gangrene: Secondary | ICD-10-CM | POA: Diagnosis present

## 2015-02-21 DIAGNOSIS — Z9841 Cataract extraction status, right eye: Secondary | ICD-10-CM | POA: Diagnosis not present

## 2015-02-21 DIAGNOSIS — Z9842 Cataract extraction status, left eye: Secondary | ICD-10-CM

## 2015-02-21 DIAGNOSIS — I2582 Chronic total occlusion of coronary artery: Secondary | ICD-10-CM | POA: Diagnosis not present

## 2015-02-21 DIAGNOSIS — E7800 Pure hypercholesterolemia, unspecified: Secondary | ICD-10-CM | POA: Diagnosis present

## 2015-02-21 DIAGNOSIS — I119 Hypertensive heart disease without heart failure: Secondary | ICD-10-CM | POA: Diagnosis present

## 2015-02-21 DIAGNOSIS — I6523 Occlusion and stenosis of bilateral carotid arteries: Secondary | ICD-10-CM | POA: Diagnosis present

## 2015-02-21 DIAGNOSIS — I249 Acute ischemic heart disease, unspecified: Secondary | ICD-10-CM | POA: Diagnosis present

## 2015-02-21 DIAGNOSIS — Z7984 Long term (current) use of oral hypoglycemic drugs: Secondary | ICD-10-CM | POA: Diagnosis not present

## 2015-02-21 DIAGNOSIS — Z9012 Acquired absence of left breast and nipple: Secondary | ICD-10-CM | POA: Diagnosis not present

## 2015-02-21 DIAGNOSIS — I25110 Atherosclerotic heart disease of native coronary artery with unstable angina pectoris: Secondary | ICD-10-CM | POA: Diagnosis not present

## 2015-02-21 DIAGNOSIS — Z853 Personal history of malignant neoplasm of breast: Secondary | ICD-10-CM

## 2015-02-21 DIAGNOSIS — R0989 Other specified symptoms and signs involving the circulatory and respiratory systems: Secondary | ICD-10-CM | POA: Diagnosis present

## 2015-02-21 DIAGNOSIS — I2510 Atherosclerotic heart disease of native coronary artery without angina pectoris: Secondary | ICD-10-CM | POA: Diagnosis present

## 2015-02-21 DIAGNOSIS — R079 Chest pain, unspecified: Secondary | ICD-10-CM | POA: Diagnosis not present

## 2015-02-21 DIAGNOSIS — Z87891 Personal history of nicotine dependence: Secondary | ICD-10-CM | POA: Diagnosis not present

## 2015-02-21 DIAGNOSIS — I252 Old myocardial infarction: Secondary | ICD-10-CM | POA: Diagnosis not present

## 2015-02-21 DIAGNOSIS — E1065 Type 1 diabetes mellitus with hyperglycemia: Secondary | ICD-10-CM | POA: Diagnosis not present

## 2015-02-21 DIAGNOSIS — I739 Peripheral vascular disease, unspecified: Secondary | ICD-10-CM | POA: Diagnosis not present

## 2015-02-21 DIAGNOSIS — E785 Hyperlipidemia, unspecified: Secondary | ICD-10-CM | POA: Diagnosis present

## 2015-02-21 DIAGNOSIS — Z79899 Other long term (current) drug therapy: Secondary | ICD-10-CM | POA: Diagnosis not present

## 2015-02-21 DIAGNOSIS — I10 Essential (primary) hypertension: Secondary | ICD-10-CM

## 2015-02-21 DIAGNOSIS — I200 Unstable angina: Secondary | ICD-10-CM | POA: Diagnosis present

## 2015-02-21 DIAGNOSIS — Z888 Allergy status to other drugs, medicaments and biological substances status: Secondary | ICD-10-CM | POA: Diagnosis not present

## 2015-02-21 DIAGNOSIS — T82898A Other specified complication of vascular prosthetic devices, implants and grafts, initial encounter: Secondary | ICD-10-CM | POA: Diagnosis present

## 2015-02-21 DIAGNOSIS — R0789 Other chest pain: Secondary | ICD-10-CM | POA: Diagnosis not present

## 2015-02-21 DIAGNOSIS — Z7982 Long term (current) use of aspirin: Secondary | ICD-10-CM

## 2015-02-21 DIAGNOSIS — E119 Type 2 diabetes mellitus without complications: Secondary | ICD-10-CM | POA: Insufficient documentation

## 2015-02-21 DIAGNOSIS — R072 Precordial pain: Secondary | ICD-10-CM | POA: Diagnosis not present

## 2015-02-21 DIAGNOSIS — Z955 Presence of coronary angioplasty implant and graft: Secondary | ICD-10-CM

## 2015-02-21 DIAGNOSIS — Z86711 Personal history of pulmonary embolism: Secondary | ICD-10-CM | POA: Diagnosis not present

## 2015-02-21 HISTORY — DX: Malignant neoplasm of unspecified site of left female breast: C50.912

## 2015-02-21 HISTORY — DX: Hypertensive heart disease without heart failure: I11.9

## 2015-02-21 HISTORY — PX: CARDIAC CATHETERIZATION: SHX172

## 2015-02-21 HISTORY — DX: Peripheral vascular disease, unspecified: I73.9

## 2015-02-21 HISTORY — PX: CORONARY ANGIOPLASTY WITH STENT PLACEMENT: SHX49

## 2015-02-21 HISTORY — DX: Other complications of anesthesia, initial encounter: T88.59XA

## 2015-02-21 HISTORY — DX: Other specified symptoms and signs involving the circulatory and respiratory systems: R09.89

## 2015-02-21 HISTORY — DX: Type 2 diabetes mellitus without complications: E11.9

## 2015-02-21 HISTORY — DX: Adverse effect of unspecified anesthetic, initial encounter: T41.45XA

## 2015-02-21 LAB — BASIC METABOLIC PANEL
ANION GAP: 11 (ref 5–15)
BUN: 12 mg/dL (ref 6–20)
CALCIUM: 10.1 mg/dL (ref 8.9–10.3)
CO2: 25 mmol/L (ref 22–32)
Chloride: 104 mmol/L (ref 101–111)
Creatinine, Ser: 0.95 mg/dL (ref 0.44–1.00)
GFR calc Af Amer: >60 mL/min (ref >60)
Glucose, Bld: 92 mg/dL (ref 65–99)
POTASSIUM: 4.2 mmol/L (ref 3.5–5.1)
SODIUM: 140 mmol/L (ref 135–145)

## 2015-02-21 LAB — CBC
HEMATOCRIT: 41.6 % (ref 36.0–46.0)
HEMATOCRIT: 39.2 % (ref 36.0–46.0)
Hemoglobin: 14 g/dL (ref 12.0–15.0)
Hemoglobin: 13.3 g/dL (ref 12.0–15.0)
MCH: 30.2 pg (ref 26.0–34.0)
MCH: 30.4 pg (ref 26.0–34.0)
MCHC: 33.7 g/dL (ref 30.0–36.0)
MCHC: 33.9 g/dL (ref 30.0–36.0)
MCV: 89.8 fL (ref 78.0–100.0)
MCV: 89.7 fL (ref 78.0–100.0)
PLATELETS: 235 10*3/uL (ref 150–400)
Platelets: 280 10*3/uL (ref 150–400)
RBC: 4.63 MIL/uL (ref 3.87–5.11)
RBC: 4.37 MIL/uL (ref 3.87–5.11)
RDW: 12.4 % (ref 11.5–15.5)
RDW: 12.4 % (ref 11.5–15.5)
WBC: 9.1 10*3/uL (ref 4.0–10.5)
WBC: 7.3 10*3/uL (ref 4.0–10.5)

## 2015-02-21 LAB — I-STAT TROPONIN, ED: TROPONIN I, POC: 0.01 ng/mL (ref 0.00–0.08)

## 2015-02-21 LAB — CREATININE, SERUM: Creatinine, Ser: 0.86 mg/dL (ref 0.44–1.00)

## 2015-02-21 LAB — GLUCOSE, CAPILLARY
Glucose-Capillary: 76 mg/dL (ref 65–99)
Glucose-Capillary: 151 mg/dL — ABNORMAL HIGH (ref 65–99)

## 2015-02-21 LAB — TROPONIN I: Troponin I: <0.03 ng/mL (ref <0.031)

## 2015-02-21 SURGERY — LEFT HEART CATH AND CORS/GRAFTS ANGIOGRAPHY
Anesthesia: LOCAL

## 2015-02-21 MED ORDER — TICAGRELOR 90 MG PO TABS
90.0000 mg | ORAL_TABLET | Freq: Two times a day (BID) | ORAL | Status: DC
  Administered 2015-02-22: 07:00:00 90 mg via ORAL
  Filled 2015-02-21: qty 1

## 2015-02-21 MED ORDER — SODIUM CHLORIDE 0.9 % IV SOLN
250.0000 mg | INTRAVENOUS | Status: DC | PRN
  Administered 2015-02-21: 1.75 mg/kg/h via INTRAVENOUS

## 2015-02-21 MED ORDER — MIDAZOLAM HCL 2 MG/2ML IJ SOLN
INTRAMUSCULAR | Status: AC
  Filled 2015-02-21: qty 2

## 2015-02-21 MED ORDER — DORZOLAMIDE HCL 2 % OP SOLN
1.0000 [drp] | Freq: Two times a day (BID) | OPHTHALMIC | Status: DC
  Administered 2015-02-21 – 2015-02-22 (×2): 1 [drp] via OPHTHALMIC
  Filled 2015-02-21 (×2): qty 10

## 2015-02-21 MED ORDER — ASPIRIN 81 MG PO CHEW: 81.0000 mg | CHEWABLE_TABLET | Freq: Every day | ORAL | Status: DC

## 2015-02-21 MED ORDER — ENOXAPARIN SODIUM 40 MG/0.4ML ~~LOC~~ SOLN
40.0000 mg | SUBCUTANEOUS | Status: DC
Start: 2015-02-22 — End: 2015-02-22
  Filled 2015-02-21 (×2): qty 0.4

## 2015-02-21 MED ORDER — ADENOSINE (DIAGNOSTIC) 140MCG/KG/MIN
INTRAVENOUS | Status: DC | PRN
  Administered 2015-02-21: 140 ug/kg/min via INTRAVENOUS

## 2015-02-21 MED ORDER — SODIUM CHLORIDE 0.9% FLUSH
3.0000 mL | Freq: Two times a day (BID) | INTRAVENOUS | Status: DC
  Administered 2015-02-21: 3 mL via INTRAVENOUS

## 2015-02-21 MED ORDER — LIDOCAINE HCL (PF) 1 % IJ SOLN
INTRAMUSCULAR | Status: DC | PRN
  Administered 2015-02-21: 5 mL via SUBCUTANEOUS
  Administered 2015-02-21: 15 mL via SUBCUTANEOUS

## 2015-02-21 MED ORDER — HYPROMELLOSE (GONIOSCOPIC) 2.5 % OP SOLN
1.0000 [drp] | OPHTHALMIC | Status: DC | PRN
Start: 2015-02-21 — End: 2015-02-22

## 2015-02-21 MED ORDER — TICAGRELOR 90 MG PO TABS
ORAL_TABLET | ORAL | Status: DC | PRN
  Administered 2015-02-21: 180 mg via ORAL

## 2015-02-21 MED ORDER — SODIUM CHLORIDE 0.9 % WEIGHT BASED INFUSION
3.0000 mL/kg/h | INTRAVENOUS | Status: AC
  Administered 2015-02-21: 20:00:00 3 mL/kg/h via INTRAVENOUS

## 2015-02-21 MED ORDER — ASPIRIN EC 81 MG PO TBEC
81.0000 mg | DELAYED_RELEASE_TABLET | Freq: Every day | ORAL | Status: DC
  Administered 2015-02-22: 09:00:00 81 mg via ORAL
  Filled 2015-02-21: qty 1

## 2015-02-21 MED ORDER — SIMVASTATIN 20 MG PO TABS: 20.0000 mg | ORAL_TABLET | Freq: Every day | ORAL | Status: DC

## 2015-02-21 MED ORDER — SODIUM CHLORIDE 0.9 % IV SOLN: 250.0000 mL | INTRAVENOUS | Status: DC | PRN

## 2015-02-21 MED ORDER — ADENOSINE 12 MG/4ML IV SOLN
12.0000 mL | Freq: Once | INTRAVENOUS | Status: DC
  Filled 2015-02-21: qty 12

## 2015-02-21 MED ORDER — FENTANYL CITRATE (PF) 100 MCG/2ML IJ SOLN
INTRAMUSCULAR | Status: DC | PRN
  Administered 2015-02-21 (×2): 50 ug via INTRAVENOUS

## 2015-02-21 MED ORDER — TICAGRELOR 90 MG PO TABS
ORAL_TABLET | ORAL | Status: AC
  Filled 2015-02-21: qty 2

## 2015-02-21 MED ORDER — NITROGLYCERIN 0.4 MG SL SUBL: 0.4000 mg | SUBLINGUAL_TABLET | SUBLINGUAL | Status: DC | PRN

## 2015-02-21 MED ORDER — GLIMEPIRIDE 4 MG PO TABS
2.0000 mg | ORAL_TABLET | Freq: Every day | ORAL | Status: DC
  Administered 2015-02-22: 2 mg via ORAL
  Filled 2015-02-21: qty 1

## 2015-02-21 MED ORDER — SODIUM CHLORIDE 0.9 % IV SOLN
INTRAVENOUS | Status: DC | PRN
  Administered 2015-02-21: 175 mL/h via INTRAVENOUS

## 2015-02-21 MED ORDER — NITROGLYCERIN 1 MG/10 ML FOR IR/CATH LAB
INTRA_ARTERIAL | Status: AC
  Filled 2015-02-21: qty 10

## 2015-02-21 MED ORDER — MIDAZOLAM HCL 2 MG/2ML IJ SOLN
INTRAMUSCULAR | Status: DC | PRN
  Administered 2015-02-21: 1 mg via INTRAVENOUS

## 2015-02-21 MED ORDER — LIDOCAINE HCL (PF) 1 % IJ SOLN
INTRAMUSCULAR | Status: AC
  Filled 2015-02-21: qty 30

## 2015-02-21 MED ORDER — CARVEDILOL 3.125 MG PO TABS
6.2500 mg | ORAL_TABLET | Freq: Two times a day (BID) | ORAL | Status: DC
  Administered 2015-02-22: 6.25 mg via ORAL
  Filled 2015-02-21: qty 2

## 2015-02-21 MED ORDER — ATORVASTATIN CALCIUM 80 MG PO TABS: 80.0000 mg | ORAL_TABLET | Freq: Every day | ORAL | Status: DC

## 2015-02-21 MED ORDER — HEPARIN SODIUM (PORCINE) 5000 UNIT/ML IJ SOLN: 5000.0000 [IU] | Freq: Three times a day (TID) | INTRAMUSCULAR | Status: DC

## 2015-02-21 MED ORDER — HEPARIN (PORCINE) IN NACL 2-0.9 UNIT/ML-% IJ SOLN
INTRAMUSCULAR | Status: AC
  Filled 2015-02-21: qty 1000

## 2015-02-21 MED ORDER — ASPIRIN 325 MG PO TABS
ORAL_TABLET | ORAL | Status: DC | PRN
  Administered 2015-02-21: 325 mg via ORAL

## 2015-02-21 MED ORDER — ACETAMINOPHEN 325 MG PO TABS: 650.0000 mg | ORAL_TABLET | ORAL | Status: DC | PRN

## 2015-02-21 MED ORDER — INSULIN ASPART 100 UNIT/ML ~~LOC~~ SOLN
0.0000 [IU] | Freq: Three times a day (TID) | SUBCUTANEOUS | Status: DC
Start: 2015-02-22 — End: 2015-02-22

## 2015-02-21 MED ORDER — IOHEXOL 350 MG/ML SOLN
INTRAVENOUS | Status: DC | PRN
  Administered 2015-02-21: 210 mL via INTRACARDIAC

## 2015-02-21 MED ORDER — SODIUM CHLORIDE 0.9% FLUSH: 3.0000 mL | INTRAVENOUS | Status: DC | PRN

## 2015-02-21 MED ORDER — TIMOLOL MALEATE 0.5 % OP SOLN
1.0000 [drp] | Freq: Two times a day (BID) | OPHTHALMIC | Status: DC
  Administered 2015-02-21 – 2015-02-22 (×2): 1 [drp] via OPHTHALMIC
  Filled 2015-02-21: qty 5

## 2015-02-21 MED ORDER — ASPIRIN 81 MG PO CHEW: 324.0000 mg | CHEWABLE_TABLET | Freq: Once | ORAL | Status: DC

## 2015-02-21 MED ORDER — HYDRALAZINE HCL 10 MG PO TABS
10.0000 mg | ORAL_TABLET | ORAL | Status: DC | PRN
  Administered 2015-02-21: 10 mg via ORAL
  Filled 2015-02-21 (×2): qty 1

## 2015-02-21 MED ORDER — ASPIRIN 325 MG PO TABS
ORAL_TABLET | ORAL | Status: AC
  Filled 2015-02-21: qty 1

## 2015-02-21 MED ORDER — BRIMONIDINE TARTRATE 0.2 % OP SOLN
1.0000 [drp] | Freq: Two times a day (BID) | OPHTHALMIC | Status: DC
  Administered 2015-02-21 – 2015-02-22 (×2): 1 [drp] via OPHTHALMIC
  Filled 2015-02-21: qty 5

## 2015-02-21 MED ORDER — BIVALIRUDIN BOLUS VIA INFUSION - CUPID
INTRAVENOUS | Status: DC | PRN
  Administered 2015-02-21: 52.425 mg via INTRAVENOUS

## 2015-02-21 MED ORDER — ALPRAZOLAM 0.25 MG PO TABS: 0.2500 mg | ORAL_TABLET | Freq: Three times a day (TID) | ORAL | Status: DC | PRN

## 2015-02-21 MED ORDER — CILOSTAZOL 100 MG PO TABS
50.0000 mg | ORAL_TABLET | Freq: Two times a day (BID) | ORAL | Status: DC
  Administered 2015-02-21 – 2015-02-22 (×2): 50 mg via ORAL
  Filled 2015-02-21: qty 1
  Filled 2015-02-21: qty 0.5

## 2015-02-21 MED ORDER — FENTANYL CITRATE (PF) 100 MCG/2ML IJ SOLN
INTRAMUSCULAR | Status: AC
  Filled 2015-02-21: qty 2

## 2015-02-21 MED ORDER — OXYCODONE-ACETAMINOPHEN 5-325 MG PO TABS: 1.0000 | ORAL_TABLET | ORAL | Status: DC | PRN

## 2015-02-21 MED ORDER — BIVALIRUDIN 250 MG IV SOLR
INTRAVENOUS | Status: AC
  Filled 2015-02-21: qty 250

## 2015-02-21 MED ORDER — ONDANSETRON HCL 4 MG/2ML IJ SOLN: 4.0000 mg | Freq: Four times a day (QID) | INTRAMUSCULAR | Status: DC | PRN

## 2015-02-21 MED ORDER — BRIMONIDINE TARTRATE-TIMOLOL 0.2-0.5 % OP SOLN: 1.0000 [drp] | Freq: Two times a day (BID) | OPHTHALMIC | Status: DC

## 2015-02-21 MED ORDER — SERTRALINE HCL 50 MG PO TABS
100.0000 mg | ORAL_TABLET | Freq: Every day | ORAL | Status: DC
  Administered 2015-02-21 – 2015-02-22 (×2): 100 mg via ORAL
  Filled 2015-02-21 (×2): qty 2

## 2015-02-21 MED ORDER — NITROGLYCERIN 1 MG/10 ML FOR IR/CATH LAB
INTRA_ARTERIAL | Status: DC | PRN
  Administered 2015-02-21 (×2): 200 ug via INTRACORONARY

## 2015-02-21 SURGICAL SUPPLY — 22 items
BALLN EMERGE MR 2.5X15 (BALLOONS) ×2
BALLN ~~LOC~~ EUPHORA RX 3.0X12 (BALLOONS) ×2
BALLOON EMERGE MR 2.5X15 (BALLOONS) IMPLANT
BALLOON ~~LOC~~ EUPHORA RX 3.0X12 (BALLOONS) IMPLANT
CATH INFINITI 5FR JL4 (CATHETERS) ×1 IMPLANT
CATH MICROCATH NAVVUS (MICROCATHETER) IMPLANT
CATH SITESEER 5F MULTI A 2 (CATHETERS) ×1 IMPLANT
DEVICE WIRE ANGIOSEAL 6FR (Vascular Products) ×1 IMPLANT
GUIDE CATH RUNWAY 6FR CLS3 (CATHETERS) ×1 IMPLANT
GUIDE CATH RUNWAY 6FR FR4 (CATHETERS) ×1 IMPLANT
KIT ENCORE 26 ADVANTAGE (KITS) ×1 IMPLANT
KIT HEART LEFT (KITS) ×2 IMPLANT
MICROCATHETER NAVVUS (MICROCATHETER) ×2
PACK CARDIAC CATHETERIZATION (CUSTOM PROCEDURE TRAY) ×2 IMPLANT
SHEATH PINNACLE 5F 10CM (SHEATH) ×1 IMPLANT
SHEATH PINNACLE 6F 10CM (SHEATH) ×1 IMPLANT
STENT PROMUS PREM MR 2.75X20 (Permanent Stent) ×1 IMPLANT
TRANSDUCER W/STOPCOCK (MISCELLANEOUS) ×2 IMPLANT
TUBING CIL FLEX 10 FLL-RA (TUBING) ×2 IMPLANT
WIRE ASAHI PROWATER 180CM (WIRE) ×2 IMPLANT
WIRE EMERALD 3MM-J .035X150CM (WIRE) ×1 IMPLANT
WIRE RUNTHROUGH .014X180CM (WIRE) ×1 IMPLANT

## 2015-02-21 NOTE — H&P (Signed)
The history and physical were reviewed. Ongoing chest pain since it started earlier this morning.Cath Lab Visit (complete for each Cath Lab visit)  Clinical Evaluation Leading to the Procedure:   ACS: Yes.    Non-ACS:    Anginal Classification: CCS IV  Anti-ischemic medical therapy: Minimal Therapy (1 class of medications)  Non-Invasive Test Results: No non-invasive testing performed  Prior CABG: Previous CABG         Prior anatomy was confirmed.  The patient presents with an ACS presentation.  Prior history of coronary bypass grafting with free LIMA to LAD and the man made while a sequential graft to diagonal and obtuse marginal. Also SVG to distal RCA.

## 2015-02-21 NOTE — ED Notes (Signed)
Patient here with intermittent chest pain x 3 -4 weeks. Complains that the pain is more frequent and couldn't find any SL ntg. Radiation to left arm and jaw

## 2015-02-21 NOTE — H&P (Signed)
History & Physical    Patient ID: Taylor [name] MRN: [id number], DOB/AGE: 1945-09-15   Admit date: 02/21/2015   Primary Physician:  Melinda Crutch, MD Primary Cardiologist: Einar Crow, MD / Jerilynn Mages. [name], MD (PV)  Patient Profile    70 year old female with prior history of CAD status post CABG who presents secondary to recurrent angina.  Past Medical History    Past Medical History  Diagnosis Date  . Coronary artery disease     a. 2006 s/p CABG x 4 (LIMA->LAD, VG->Diag->OM, VG->RPDA;  b. 07/2014 MV: EF 51%, medium defect of mild severity, likely artifact, low risk.  [name] Hypercholesteremia   . Hypertensive heart disease   . Type II diabetes mellitus ([place])   . Reflux esophagitis   . Depression   . Breast cancer ([place])   . Pulmonary embolism ([place])     a. 2014.  [name] Hx of tobacco use, presenting hazards to health     a. quit 2006.  [name] PAD (peripheral artery disease) ([place])     a. 11/2014 ABI: R: 0.63, L 0.59;  b. 11/2014 Periph Angio: bilat pop occlusions. L - short w/ reconstituion via collats in dist pop w/ 2 vessel runoff, R long w/ reconstitution in prox tib/peroneal arteries-->med Rx w/ pletal.  . Left carotid bruit     a. 09/2014 Carotid U/S: 1-39% bilat ICA stenosis.    Past Surgical History  Procedure Laterality Date  . Coronary artery bypass graft  2006    x4  . Ankle surgery      left  . Cholecystectomy    . Cataract extraction, bilateral    . Right renal detachment surgery    . Eye surgery      right  . Mastectomy    . Peripheral vascular catheterization N/A 12/12/2014    Procedure: Abdominal Aortogram;  Surgeon: Wellington Hampshire, MD;  Location: [facility];  Service: Cardiovascular;  Laterality: N/A;     Allergies  Allergies  Allergen Reactions  . Niacin Other (See Comments)    burning    History of Present Illness    70 year old female with the above couplets past medical history. She is a history of coronary artery disease and is status post coronary  artery bypass grafting 4 in 2006. She had a negative Myoview in July 2016 in the setting of atypical chest pain. She also has history of hypertension, hyperlipidemia, type 2 diabetes mellitus, remote tobacco abuse, and peripheral arterial disease with known bilateral popliteal occlusions. She was in her usual state of health until this morning when she had sudden onset of substernal chest pressure with radiation to the left arm and associated with mild dyspnea. Symptoms persisted for approximately 30 minutes and resolve spontaneously. After discussion with family members sometime later, decision was made to come to the ED. Here, she is pain-free. ECG shows no acute ST or T changes and initial troponin is normal.  Home Medications    Prior to Admission medications   Medication Sig Start Date End Date Taking? Authorizing Provider  acetaminophen (TYLENOL) 500 MG tablet Take 1,000 mg by mouth every 6 (six) hours as needed for pain.   Yes Historical Provider, MD  ALPRAZolam (XANAX) 0.25 MG tablet Take 0.25 mg by mouth 3 (three) times daily as needed for anxiety. For anxiety   Yes Historical Provider, MD  aspirin EC 81 MG tablet Take 1 tablet (81 mg total) by mouth daily. 05/04/13  Yes Larey Dresser, MD  Brimonidine  Tartrate-Timolol (COMBIGAN OP) Place 1 drop into both eyes 2 (two) times daily.    Yes Historical Provider, MD  carvedilol (COREG) 6.25 MG tablet Take 6.25 mg by mouth 2 (two) times daily with a meal.   Yes Historical Provider, MD  cilostazol (PLETAL) 50 MG tablet Take 1 tablet (50 mg total) by mouth 2 (two) times daily. 12/12/14  Yes Wellington Hampshire, MD  dorzolamide (TRUSOPT) 2 % ophthalmic solution Place 1 drop into the right eye 2 (two) times daily.   Yes Historical Provider, MD  glimepiride (AMARYL) 2 MG tablet Take 2 mg by mouth daily with breakfast.    Yes Historical Provider, MD  hydroxypropyl methylcellulose (ISOPTO TEARS) 2.5 % ophthalmic solution Place 1 drop into both eyes as needed  for dry eyes.    Yes Historical Provider, MD  nitroGLYCERIN (NITROSTAT) 0.4 MG SL tablet Place 1 tablet (0.4 mg total) under the tongue every 5 (five) minutes as needed for chest pain. 08/06/14  Yes Imogene Burn, PA-C  sertraline (ZOLOFT) 50 MG tablet Take 100 mg by mouth daily.    Yes Historical Provider, MD  simvastatin (ZOCOR) 20 MG tablet Take 20 mg by mouth daily at 6 PM.    Yes Historical Provider, MD    Family History    Family History  Problem Relation Age of Onset  . Coronary artery disease Mother 3    status post CABG   . Heart attack Father 45    deceased secondary to fatal  first myocardial infarction  . Coronary artery disease Sister 57    alive had bypass surgery  . Cancer Sister   . Deep vein thrombosis Cousin     Neice had after knee surgery    Social History    Social History   Social History  . Marital Status: Married    Spouse Name: N/A  . Number of Children: N/A  . Years of Education: N/A   Occupational History  . Not on file.   Social History Main Topics  . Smoking status: Former Smoker    Quit date: 01/13/2004  . Smokeless tobacco: Not on file  . Alcohol Use: No  . Drug Use: No  . Sexual Activity: Not Currently   Other Topics Concern  . Not on file   Social History Narrative   Lives in [place] with her husband.     Review of Systems    General:  No chills, fever, night sweats or weight changes.  Cardiovascular:  +++ chest pain, +++ mild dyspnea in setting of c/p today. No edema, orthopnea, palpitations, paroxysmal nocturnal dyspnea. Dermatological: No rash, lesions/masses Respiratory: No cough, dyspnea Urologic: No hematuria, dysuria Abdominal:   No nausea, vomiting, diarrhea, bright red blood per rectum, melena, or hematemesis Neurologic:  No visual changes, wkns, changes in mental status. All other systems reviewed and are otherwise negative except as noted above.  Physical Exam    Blood pressure 163/69, pulse 66,  temperature 98 F (36.7 C), temperature source Oral, resp. rate 18, height 5\' 7"  (1.702 m), weight 154 lb (69.854 kg), SpO2 98 %.  General: Pleasant, NAD Psych: Normal affect. Neuro: Alert and oriented X 3. Moves all extremities spontaneously. HEENT: Normal  Neck: Supple without JVD.  Soft L carotid bruit. Lungs:  Resp regular and unlabored, CTA. Heart: RRR no s3, s4, or murmurs. Abdomen: Soft, non-tender, non-distended, BS + x 4.  Extremities: No clubbing, cyanosis or edema. DP/PT/Radials 2+ and equal bilaterally.  Labs  Troponin (Point of Care Test)  Recent Labs  02/21/15 1502  TROPIPOC 0.01   Lab Results  Component Value Date   WBC 9.1 02/21/2015   HGB 14.0 02/21/2015   HCT 41.6 02/21/2015   MCV 89.8 02/21/2015   PLT 280 02/21/2015     Recent Labs Lab 02/21/15 1444  NA 140  K 4.2  CL 104  CO2 25  BUN 12  CREATININE 0.95  CALCIUM 10.1  GLUCOSE 92   Lab Results  Component Value Date   CHOL 92 09/10/2014   HDL 29.70* 09/10/2014   LDLCALC 49 09/10/2014   TRIG 64.0 09/10/2014      Radiology Studies    Dg Chest 2 View  02/21/2015  CLINICAL DATA:  Central chest pressure and pain, LEFT arm pain, symptoms off and on over past month, history of breast cancer post LEFT mastectomy, coronary artery disease post MI and CABG, hypertension, diabetes mellitus EXAM: CHEST  2 VIEW COMPARISON:  03/19/2009, correlation CT chest 02/29/2012 FINDINGS: Normal heart size post median sternotomy. Atherosclerotic calcification aorta. Mediastinal contours and pulmonary vascularity normal. Lungs hyperinflated but clear. No pleural effusion or pneumothorax. Scattered endplate spur formation thoracic spine. IMPRESSION: Post CABG. Hyperinflated lungs question COPD without acute infiltrate. Electronically Signed   By: Lavonia Dana M.D.   On: 02/21/2015 15:07    ECG & Cardiac Imaging    Regular sinus rhythm, 65, extreme right axis, incomplete right bundle branch block, no acute ST or T  changes.  Assessment & Plan     1. Unstable angina/CAD: 70 year old female with a history coronary artery bypass grafting 4 in 2006 who presented to the emergency department today after a 30 minute episode of substernal chest. Symptoms resolved spontaneously. She is currently chest pain-free. ECG is nonacute and initial troponin is normal. Plan to admit and cycle cardiac markers. As symptoms are concerning for angina, we will plan on diagnostic catheterization this afternoon. Continue aspirin, statin, beta blocker.  2. Hypertensive heart disease: Blood pressure elevated in the emergency department. Continue beta blocker. Would have a low threshold to add an ACE inhibitor given diabetes. Renal function is normal.  3. Hyperlipidemia: LDL was 49 in August 2016. LFTs were normal in December 2016. Continue simvastatin therapy.  4. Peripheral arterial disease: Known bilateral popliteal occlusions with distal reconstitution. She remains on aspirin and Pletal and has been symptomatically stable.  5. Type 2 diabetes mellitus: Continue Amaryl and + scale insulin.  Signed, Murray Hodgkins, NP 02/21/2015, 5:08 PM  Pt of Dr Claris Gladden and Arida's (PV) with H/O CABG 2006. Neg MV last year. + CRF with crescendo angina over past 1-2 weeks, worse this AM. Currently has mild chest pressure. Exam only notable for Left carotid bruit. EKG w/o acute changes. Enz neg. Given on going pain and 70 year old grafts I favor cath this afternoon. The patient understands that risks included but are not limited to stroke (1 in 1000), death (1 in 18), kidney failure [usually temporary] (1 in 500), bleeding (1 in 200), allergic reaction [possibly serious] (1 in 200). The patient understands and agrees to proceed   Agree with note by Ignacia Bayley RNP

## 2015-02-21 NOTE — ED Provider Notes (Signed)
CSN: [id number]     Arrival date & time 02/21/15  1431 History   First MD Initiated Contact with Patient 02/21/15 1524     Chief Complaint  Patient presents with  . Chest Pain     (Consider location/radiation/quality/duration/timing/severity/associated sxs/prior Treatment) Patient is a 70 y.o. female presenting with chest pain. The history is provided by the patient.  Chest Pain Pain location:  Substernal area Pain quality: pressure   Pain radiates to:  L jaw and L arm Onset quality:  Gradual (twice daily every day now increased in frequency) Duration:  1 month Timing:  Sporadic Progression:  Waxing and waning Chronicity: radiation symptoms are new. Context: at rest   Context comment:  Exertionally worsening Relieved by:  Nothing Worsened by:  Nothing tried Ineffective treatments:  None tried Associated symptoms: no cough, no diaphoresis, no nausea, no shortness of breath and not vomiting   Risk factors: coronary artery disease (s/p remote CABG), diabetes mellitus, high cholesterol and hypertension   Risk factors: no smoking     Past Medical History  Diagnosis Date  . Coronary artery disease   . Hypercholesteremia   . Hypertension   . Diabetes mellitus   . Reflux esophagitis   . Depression   . GERD (gastroesophageal reflux disease)   . Acute MI ([place])   . Cancer ([place])     breast  . Pulmonary embolism ([place])   . Hx of tobacco use, presenting hazards to health 12/21/2013   Past Surgical History  Procedure Laterality Date  . Coronary artery bypass graft      x4  . Ankle surgery      left  . Cholecystectomy    . Cataract extraction, bilateral    . Right renal detachment surgery    . Eye surgery      right  . Mastectomy    . Peripheral vascular catheterization N/A 12/12/2014    Procedure: Abdominal Aortogram;  Surgeon: Wellington Hampshire, MD;  Location: [facility];  Service: Cardiovascular;  Laterality: N/A;   Family History  Problem Relation Age of Onset   . Coronary artery disease Mother 76    status post CABG   . Heart attack Father 33    deceased secondary to fatal  first myocardial infarction  . Coronary artery disease Sister 44    alive had bypass surgery  . Cancer Sister   . Deep vein thrombosis Cousin     Neice had after knee surgery   Social History  Substance Use Topics  . Smoking status: Former Smoker    Quit date: 01/13/2004  . Smokeless tobacco: None  . Alcohol Use: No   OB History    No data available     Review of Systems  Constitutional: Negative for diaphoresis.  Respiratory: Negative for cough and shortness of breath.   Cardiovascular: Positive for chest pain.  Gastrointestinal: Negative for nausea and vomiting.  All other systems reviewed and are negative.     Allergies  Niacin  Home Medications   Prior to Admission medications   Medication Sig Start Date End Date Taking? Authorizing Provider  acetaminophen (TYLENOL) 500 MG tablet Take 1,000 mg by mouth every 6 (six) hours as needed for pain.    Historical Provider, MD  ALPRAZolam Duanne Moron) 0.25 MG tablet Take 0.25 mg by mouth 3 (three) times daily as needed. For anxiety    Historical Provider, MD  aspirin EC 81 MG tablet Take 1 tablet (81 mg total) by mouth daily.  05/04/13   Larey Dresser, MD  Brimonidine Tartrate-Timolol (COMBIGAN OP) Place 1 drop into both eyes 2 (two) times daily.     Historical Provider, MD  carvedilol (COREG) 6.25 MG tablet Take 6.25 mg by mouth 2 (two) times daily with a meal.    Historical Provider, MD  cilostazol (PLETAL) 50 MG tablet Take 1 tablet (50 mg total) by mouth 2 (two) times daily. 12/12/14   Wellington Hampshire, MD  dorzolamide (TRUSOPT) 2 % ophthalmic solution Place 1 drop into the right eye 2 (two) times daily.    Historical Provider, MD  glimepiride (AMARYL) 2 MG tablet Take 2 mg by mouth daily with breakfast.     Historical Provider, MD  hydroxypropyl methylcellulose (ISOPTO TEARS) 2.5 % ophthalmic solution Place 1  drop into both eyes as needed for dry eyes.     Historical Provider, MD  nitroGLYCERIN (NITROSTAT) 0.4 MG SL tablet Place 1 tablet (0.4 mg total) under the tongue every 5 (five) minutes as needed for chest pain. 08/06/14   Imogene Burn, PA-C  sertraline (ZOLOFT) 50 MG tablet Take 50 mg by mouth daily.      Historical Provider, MD  simvastatin (ZOCOR) 20 MG tablet Take 20 mg by mouth daily.    Historical Provider, MD   BP 163/69 mmHg  Pulse 66  Temp(Src) 98 F (36.7 C) (Oral)  Resp 18  Ht 5\' 7"  (1.702 m)  Wt 154 lb (69.854 kg)  BMI 24.11 kg/m2  SpO2 98% Physical Exam  Constitutional: She is oriented to person, place, and time. She appears well-developed and well-nourished. No distress.  HENT:  Head: Normocephalic.  Eyes: Conjunctivae are normal.  Neck: Neck supple. No tracheal deviation present.  Cardiovascular: Normal rate, regular rhythm and normal heart sounds.   Pulmonary/Chest: Effort normal and breath sounds normal. No respiratory distress. She has no wheezes. She has no rales. She exhibits no tenderness.  Abdominal: Soft. She exhibits no distension.  Neurological: She is alert and oriented to person, place, and time.  Skin: Skin is warm and dry.  Psychiatric: She has a normal mood and affect.    ED Course  Procedures (including critical care time) Labs Review Labs Reviewed  CBC  BASIC METABOLIC PANEL  I-STAT TROPOININ, ED    Imaging Review Dg Chest 2 View  02/21/2015  CLINICAL DATA:  Central chest pressure and pain, LEFT arm pain, symptoms off and on over past month, history of breast cancer post LEFT mastectomy, coronary artery disease post MI and CABG, hypertension, diabetes mellitus EXAM: CHEST  2 VIEW COMPARISON:  03/19/2009, correlation CT chest 02/29/2012 FINDINGS: Normal heart size post median sternotomy. Atherosclerotic calcification aorta. Mediastinal contours and pulmonary vascularity normal. Lungs hyperinflated but clear. No pleural effusion or pneumothorax.  Scattered endplate spur formation thoracic spine. IMPRESSION: Post CABG. Hyperinflated lungs question COPD without acute infiltrate. Electronically Signed   By: Lavonia Dana M.D.   On: 02/21/2015 15:07   I have personally reviewed and evaluated these images and lab results as part of my medical decision-making.   EKG Interpretation   Date/Time:  Thursday February 21 2015 14:35:07 EST Ventricular Rate:  65 PR Interval:  144 QRS Duration: 94 QT Interval:  408 QTC Calculation: 424 R Axis:   -124 Text Interpretation:  Normal sinus rhythm Right superior axis deviation  Incomplete right bundle branch block Anterior infarct , age undetermined  Abnormal ECG Confirmed by Patra Gherardi MD, Evie Croston [id number]) on 02/21/2015 3:23:13 PM      MDM  Final diagnoses:  Chest pain at rest    70 y.o. female presents with chest pain which has been intermittent at rest over last several weeks with new onset radiation symptoms to left neck and left arm concerning for developing unstable angina today. First troponin negative. ASA provided, no indication for heparinization currently. High risk with h/o CABG and vasculopathy with concerning symptoms for ACS. Cardiology consulted and will admit for further workup and management.     Leo Grosser, MD 02/22/15 [phone]

## 2015-02-22 ENCOUNTER — Encounter (HOSPITAL_COMMUNITY): Payer: Self-pay | Admitting: Interventional Cardiology

## 2015-02-22 ENCOUNTER — Other Ambulatory Visit: Payer: Self-pay

## 2015-02-22 DIAGNOSIS — I2510 Atherosclerotic heart disease of native coronary artery without angina pectoris: Secondary | ICD-10-CM | POA: Diagnosis present

## 2015-02-22 LAB — CBC
HCT: 41.4 % (ref 36.0–46.0)
HEMOGLOBIN: 13.9 g/dL (ref 12.0–15.0)
MCH: 30.4 pg (ref 26.0–34.0)
MCHC: 33.6 g/dL (ref 30.0–36.0)
MCV: 90.6 fL (ref 78.0–100.0)
PLATELETS: 252 10*3/uL (ref 150–400)
RBC: 4.57 MIL/uL (ref 3.87–5.11)
RDW: 12.5 % (ref 11.5–15.5)
WBC: 8.1 10*3/uL (ref 4.0–10.5)

## 2015-02-22 LAB — TROPONIN I
Troponin I: 0.06 ng/mL — ABNORMAL HIGH (ref <0.031)
Troponin I: 0.07 ng/mL — ABNORMAL HIGH (ref <0.031)

## 2015-02-22 LAB — BASIC METABOLIC PANEL
ANION GAP: 7 (ref 5–15)
BUN: 10 mg/dL (ref 6–20)
CHLORIDE: 105 mmol/L (ref 101–111)
CO2: 28 mmol/L (ref 22–32)
CREATININE: 0.88 mg/dL (ref 0.44–1.00)
Calcium: 9.3 mg/dL (ref 8.9–10.3)
GFR calc non Af Amer: >60 mL/min (ref >60)
Glucose, Bld: 150 mg/dL — ABNORMAL HIGH (ref 65–99)
Potassium: 4.3 mmol/L (ref 3.5–5.1)
SODIUM: 140 mmol/L (ref 135–145)

## 2015-02-22 LAB — POCT ACTIVATED CLOTTING TIME: Activated Clotting Time: 322 seconds

## 2015-02-22 LAB — GLUCOSE, CAPILLARY: Glucose-Capillary: 111 mg/dL — ABNORMAL HIGH (ref 65–99)

## 2015-02-22 MED ORDER — TICAGRELOR 90 MG PO TABS: 90.0000 mg | ORAL_TABLET | Freq: Two times a day (BID) | ORAL | Status: DC

## 2015-02-22 MED ORDER — LISINOPRIL 5 MG PO TABS
5.0000 mg | ORAL_TABLET | Freq: Every day | ORAL | Status: DC
  Administered 2015-02-22: 5 mg via ORAL
  Filled 2015-02-22: qty 1

## 2015-02-22 MED ORDER — LISINOPRIL 5 MG PO TABS: 5.0000 mg | ORAL_TABLET | Freq: Every day | ORAL | Status: DC

## 2015-02-22 MED ORDER — ATORVASTATIN CALCIUM 80 MG PO TABS: 80.0000 mg | ORAL_TABLET | Freq: Every day | ORAL | Status: DC

## 2015-02-22 MED FILL — Heparin Sodium (Porcine) 2 Unit/ML in Sodium Chloride 0.9%: INTRAMUSCULAR | Qty: 1000 | Status: AC

## 2015-02-22 NOTE — Progress Notes (Signed)
Patient Name: Taylor [name] Date of Encounter: 02/22/2015     Active Problems:   DM (diabetes mellitus) type I uncontrolled, periph vascular disorder ([place])   HYPERCHOLESTEROLEMIA   Essential hypertension   Left carotid bruit   Coronary artery disease   Hx of tobacco use, presenting hazards to health   PAD (peripheral artery disease) ([place])   Hypertensive heart disease   Chest pain at rest   Unstable angina ([place])    SUBJECTIVE No CP or SOB. Ready to go home.   CURRENT MEDS . aspirin  81 mg Oral Daily  . aspirin EC  81 mg Oral Daily  . atorvastatin  80 mg Oral q1800  . brimonidine  1 drop Both Eyes BID   And  . timolol  1 drop Both Eyes BID  . carvedilol  6.25 mg Oral BID WC  . cilostazol  50 mg Oral BID  . dorzolamide  1 drop Right Eye BID  . enoxaparin (LOVENOX) injection  40 mg Subcutaneous Q24H  . glimepiride  2 mg Oral Q breakfast  . insulin aspart  0-15 Units Subcutaneous TID WC  . sertraline  100 mg Oral Daily  . simvastatin  20 mg Oral q1800  . sodium chloride flush  3 mL Intravenous Q12H  . sodium chloride flush  3 mL Intravenous Q12H  . ticagrelor  90 mg Oral BID    OBJECTIVE  Filed Vitals:   02/22/15 0000 02/22/15 0500 02/22/15 0725 02/22/15 0738  BP: 142/50 160/65  151/52  Pulse:  57    Temp:  97 F (36.1 C)  98 F (36.7 C)  TempSrc:  Oral  Oral  Resp: 16 17 20 23   Height:      Weight:  165 lb 12.6 oz (75.2 kg)    SpO2:  98%      Intake/Output Summary (Last 24 hours) at 02/22/15 0852 Last data filed at 02/22/15 0738  Gross per 24 hour  Intake 1078.8 ml  Output   2100 ml  Net -1021.2 ml   Filed Weights   02/21/15 1438 02/22/15 0500  Weight: 154 lb (69.854 kg) 165 lb 12.6 oz (75.2 kg)    PHYSICAL EXAM  General: Pleasant, NAD. Neuro: Alert and oriented X 3. Moves all extremities spontaneously. Psych: Normal affect. HEENT:  Normal  Neck: Supple without bruits or JVD. Lungs:  Resp regular and unlabored, CTA. Heart: RRR no s3, s4,  or murmurs. Abdomen: Soft, non-tender, non-distended, BS + x 4.  Extremities: No clubbing, cyanosis or edema. DP/PT/Radials 2+ and equal bilaterally.  Accessory Clinical Findings  CBC  Recent Labs  02/21/15 1444 02/21/15 1938  WBC 9.1 7.3  HGB 14.0 13.3  HCT 41.6 39.2  MCV 89.8 89.7  PLT 280 [id number]   Basic Metabolic Panel  Recent Labs  02/21/15 1444 02/21/15 1938  NA 140  --   K 4.2  --   CL 104  --   CO2 25  --   GLUCOSE 92  --   BUN 12  --   CREATININE 0.95 0.86  CALCIUM 10.1  --    Cardiac Enzymes  Recent Labs  02/21/15 1938 02/22/15 0003  TROPONINI <0.03 0.06*    TELE   some sinus bradycardia and sinus arrhythmia.   Radiology/Studies  Dg Chest 2 View  02/21/2015  CLINICAL DATA:  Central chest pressure and pain, LEFT arm pain, symptoms off and on over past month, history of breast cancer post LEFT mastectomy, coronary artery disease post  MI and CABG, hypertension, diabetes mellitus EXAM: CHEST  2 VIEW COMPARISON:  03/19/2009, correlation CT chest 02/29/2012 FINDINGS: Normal heart size post median sternotomy. Atherosclerotic calcification aorta. Mediastinal contours and pulmonary vascularity normal. Lungs hyperinflated but clear. No pleural effusion or pneumothorax. Scattered endplate spur formation thoracic spine. IMPRESSION: Post CABG. Hyperinflated lungs question COPD without acute infiltrate. Electronically Signed   By: Lavonia Dana M.D.   On: 02/21/2015 15:07   02/21/15 Conclusion    1. Ost RPDA lesion, 75% stenosed. 2. Mid RCA lesion, 99% stenosed. 3. 1st Mrg lesion, 70% stenosed. The lesion was not previously treated. 4. Ost LAD lesion, 100% stenosed. 5. Sequential SVG . 6. Origin to Prox Graft lesion, before 1st Diag, 100% stenosed. 7. LIMA was injected is normal in caliber, and is anatomically normal. 8. Dist LAD lesion, 99% stenosed. 9. SVG was injected . 10. There is severe disease in the graft. 11. Origin lesion, 100% stenosed.   Acute  coronary syndrome/unstable angina presentation in a 70 year old patient with remote history of coronary bypass grafting (2006).  Bypass graft failure with occlusion of the saphenous vein graft to the distal right coronary, occlusion of the sequential saphenous vein graft to the diagonal and obtuse marginal. Patent free LIMA to the LAD.   Severe native vessel coronary disease with 99% mid RCA, 75% ostial PDA, total occlusion of the ostial LAD, and segmental 50-75% stenosis in the mid circumflex/obtuse marginal. The distal right coronary receives collaterals from left-to-right.  Successful PCI and stent of the mid RCA from 99% to 0% with TIMI grade 3 flow.  The circumflex obtuse marginal lesion was assessed with FFR and was found to be hemodynamically insignificant with a value of 0.84.  Normal left ventricular function and hemodynamics. EF is 55%.  RECOMMENDATIONS:   Aspirin and Brilinta for at least 6 months and preferably a year.  If no groin complication or other clinical issues, anticipate discharge tomorrow.       ASSESSMENT AND PLAN Taylor [name] is a 70 y.o. female with a history of CAD s/p CABG (2006), HTN, HLD, T2DM, remote tobacco abuse, and PAD with known bilateral popliteal occlusions who presented to [facility] ED on 02/21/15 with chest pain concerning for [place].   CAD/[place]: She is s/p LHC on 02/21/15 which revealed bypass graft failure with occlusion of the SVG--> dRCA and sequential SVG--> diag/OM. Patent free LIMA to the LAD as well as severe native vessel coronary disease with 99% mid RCA, 75% ostial PDA, total occlusion of the ostial LAD, and segmental 50-75% stenosis in the mid circumflex/obtuse marginal. The distal right coronary receives collaterals from left-to-right. The LCx OM lesion was assessed with FFR and found to be hemodynamically insignificant with a value of 0.84. She is S/P successful PCI/DES of mid RCA. Normal LV function and hemodynamics. EF is 55%. Placed on  aspirin and Brilinta for at least 6 months to a year. Continue aspirin, statin, beta blocker.   Hypertensive heart disease: Blood pressure mildly elevated today.  Continue Coreg 6.25 mg twice a day. I will add lisinopril 5 mg daily in the setting of diabetes mellitus. She has normal renal function. Will recheck a BMET in the office in 1 week.  HLD: LDL was 49 in August 2016. LFTs were normal in December 2016. Continue simvastatin therapy.  PAD: Known bilateral popliteal occlusions with distal reconstitution. She remains on aspirin and Pletal and has been symptomatically stable.  T2DM: Continue Amaryl and + scale insulin.   Signed, Grandville Silos, KATHRYN  R PA-C  Pager [phone]  Patient seen and examined. I agree with the assessment and plan as detailed above. See also my additional thoughts below.   The patient is stable today after her cardiac catheterization procedure yesterday. She is stable to go home. I agree with the complete note as outlined above.  Dola Argyle, MD, [facility] 02/22/2015 9:49 AM

## 2015-02-22 NOTE — Discharge Instructions (Signed)

## 2015-02-22 NOTE — Progress Notes (Signed)
CARDIAC REHAB PHASE I   PRE:  Rate/Rhythm: 58 SB    BP: sitting 151/52    SaO2:   MODE:  Ambulation: 460 ft   POST:  Rate/Rhythm: 77 SR    BP: sitting 159/55     SaO2:   Tolerated well, no c/o. Ed completed. Understands importance of Brilinta, will need coupon. Discussed importance of daily exercise for her PVD and CAD. Will send referral to [place]. [place], [place], ACSM 02/22/2015 8:42 AM

## 2015-02-22 NOTE — Discharge Summary (Signed)
Discharge Summary    Patient ID: Taylor [name],  MRN: [id number], DOB/AGE: Jun 17, 1945 70 y.o.  Admit date: 02/21/2015 Discharge date: 02/22/2015  Primary Care Provider:  Melinda Crutch Primary Cardiologist: Dr. Aundra Dubin Dr [name] ([place])    Discharge Diagnoses    Principal Problem:   Unstable angina [facility])- s/p DES to [facility] Active Problems:   DM (diabetes mellitus)    HLD   Essential hypertension   Left carotid bruit   Hx of tobacco use   PAD (peripheral artery disease) (HCC)   Hypertensive heart disease   Coronary artery disease   Allergies Allergies  Allergen Reactions  . Niacin Other (See Comments)    burning     History of Present Illness     Taylor [name] is a 70 y.o. female with a history of CAD s/p CABG (2006), HTN, HLD, T2DM, remote tobacco abuse, and PAD with known bilateral popliteal occlusions who presented to [facility] ED on 02/21/15 with chest pain concerning for [place].   She was taken back for cardiac catheterization on the day of presentation. She is s/p LHC on 02/21/15 which revealed bypass graft failure with occlusion of the SVG--> dRCA and sequential SVG--> diag/OM. Patent free LIMA to the LAD as well as severe native vessel coronary disease with 99% mid RCA, 75% ostial PDA, total occlusion of the ostial LAD, and segmental 50-75% stenosis in the mid circumflex/obtuse marginal. The distal right coronary receives collaterals from left-to-right. The LCx OM lesion was assessed with FFR and found to be hemodynamically insignificant with a value of 0.84. She is S/P successful PCI/DES of mid RCA.  Normal LV function and hemodynamics. EF is 55%. Placed on aspirin and Brilinta for at least 6 months to a year.  She was noted to be mildly hypertensive during her admission and lisinopril 5 mg was added on the day of discharge.    Hospital Course     Consultants: none  CAD/[place]: She is s/p LHC on 02/21/15 which revealed bypass graft failure with occlusion of the SVG--> dRCA and  sequential SVG--> diag/OM. Patent free LIMA to the LAD as well as severe native vessel coronary disease with 99% mid RCA, 75% ostial PDA, total occlusion of the ostial LAD, and segmental 50-75% stenosis in the mid circumflex/obtuse marginal. The distal right coronary receives collaterals from left-to-right. The LCx OM lesion was assessed with FFR and found to be hemodynamically insignificant with a value of 0.84. She is S/P successful PCI/DES of mid RCA. Normal LV function and hemodynamics. EF is 55%. Placed on aspirin and Brilinta for at least 6 months to a year.  Hypertensive heart disease: Blood pressure mildly elevated today. Continue Coreg 6.25 mg twice a day. I will add lisinopril 5 mg daily in the setting of diabetes mellitus. She has normal renal function. Will recheck a BMET in the office in 1 week.  HLD: LDL was 49 in August 2016. LFTs were normal in December 2016. Will change simvastatin to atorva 80mg .   PAD: Known bilateral popliteal occlusions with distal reconstitution. She remains on aspirin and Pletal and has been symptomatically stable.  T2DM: Continue home regimen.   Carotid artery disease:  Carotid U/S: 1-39% bilat ICA stenosis (09/2014).  The patient has had an uncomplicated hospital course and is recovering well. The femoral catheter site is stable She has been seen by Dr. Ron Parker today and deemed ready for discharge home. All follow-up appointments have been scheduled. A written RX for a 30 day free supply  of Brilinta was provided for the patient.  Discharge medications are listed below.  _____________  Discharge Vitals Blood pressure 151/52, pulse 57, temperature 98 F (36.7 C), temperature source Oral, resp. rate 23, height 5\' 7"  (1.702 m), weight 165 lb 12.6 oz (75.2 kg), SpO2 98 %.  Filed Weights   02/21/15 1438 02/22/15 0500  Weight: 154 lb (69.854 kg) 165 lb 12.6 oz (75.2 kg)    Labs & Radiologic Studies     CBC  Recent Labs  02/21/15 1938 02/22/15 0804  WBC  7.3 8.1  HGB 13.3 13.9  HCT 39.2 41.4  MCV 89.7 90.6  PLT 235 [id number]   Basic Metabolic Panel  Recent Labs  02/21/15 1444 02/21/15 1938 02/22/15 0804  NA 140  --  140  K 4.2  --  4.3  CL 104  --  105  CO2 25  --  28  GLUCOSE 92  --  150*  BUN 12  --  10  CREATININE 0.95 0.86 0.88  CALCIUM 10.1  --  9.3   Cardiac Enzymes  Recent Labs  02/21/15 1938 02/22/15 0003 02/22/15 0804  TROPONINI <0.03 0.06* 0.07*    Dg Chest 2 View  02/21/2015  CLINICAL DATA:  Central chest pressure and pain, LEFT arm pain, symptoms off and on over past month, history of breast cancer post LEFT mastectomy, coronary artery disease post MI and CABG, hypertension, diabetes mellitus EXAM: CHEST  2 VIEW COMPARISON:  03/19/2009, correlation CT chest 02/29/2012 FINDINGS: Normal heart size post median sternotomy. Atherosclerotic calcification aorta. Mediastinal contours and pulmonary vascularity normal. Lungs hyperinflated but clear. No pleural effusion or pneumothorax. Scattered endplate spur formation thoracic spine. IMPRESSION: Post CABG. Hyperinflated lungs question COPD without acute infiltrate. Electronically Signed   By: Lavonia Dana M.D.   On: 02/21/2015 15:07     Diagnostic Studies/Procedures    02/21/15 Conclusion    1. Ost RPDA lesion, 75% stenosed. 2. Mid RCA lesion, 99% stenosed. 3. 1st Mrg lesion, 70% stenosed. The lesion was not previously treated. 4. Ost LAD lesion, 100% stenosed. 5. Sequential SVG . 6. Origin to Prox Graft lesion, before 1st Diag, 100% stenosed. 7. LIMA was injected is normal in caliber, and is anatomically normal. 8. Dist LAD lesion, 99% stenosed. 9. SVG was injected . 10. There is severe disease in the graft. 11. Origin lesion, 100% stenosed.   Acute coronary syndrome/unstable angina presentation in a 70 year old patient with remote history of coronary bypass grafting (2006).  Bypass graft failure with occlusion of the saphenous vein graft to the distal right  coronary, occlusion of the sequential saphenous vein graft to the diagonal and obtuse marginal. Patent free LIMA to the LAD.   Severe native vessel coronary disease with 99% mid RCA, 75% ostial PDA, total occlusion of the ostial LAD, and segmental 50-75% stenosis in the mid circumflex/obtuse marginal. The distal right coronary receives collaterals from left-to-right.  Successful PCI and stent of the mid RCA from 99% to 0% with TIMI grade 3 flow.  The circumflex obtuse marginal lesion was assessed with FFR and was found to be hemodynamically insignificant with a value of 0.84.  Normal left ventricular function and hemodynamics. EF is 55%. RECOMMENDATIONS:  Aspirin and Brilinta for at least 6 months and preferably a year.  If no groin complication or other clinical issues, anticipate discharge tomorrow.         _____________    Disposition   Pt is being discharged home today in good condition.  Follow-up  Plans & Appointments    Follow-up Information    Follow up with Eileen Stanford, PA-C On 02/28/2015.   Specialties:  Cardiology, Radiology   Why:  @ 2pm    Contact information:   [place] [id number] [phone]      Discharge Instructions    Amb Referral to Cardiac Rehabilitation    Complete by:  As directed   Diagnosis:  PCI           Discharge Medications   Current Discharge Medication List    START taking these medications   Details  atorvastatin (LIPITOR) 80 MG tablet Take 1 tablet (80 mg total) by mouth daily at 6 PM. Qty: 90 tablet, Refills: 3    ticagrelor (BRILINTA) 90 MG TABS tablet Take 1 tablet (90 mg total) by mouth 2 (two) times daily. Qty: 180 tablet, Refills: 6      CONTINUE these medications which have NOT CHANGED   Details  acetaminophen (TYLENOL) 500 MG tablet Take 1,000 mg by mouth every 6 (six) hours as needed for pain.    ALPRAZolam (XANAX) 0.25 MG tablet Take 0.25 mg by mouth 3 (three) times  daily as needed for anxiety. For anxiety    aspirin EC 81 MG tablet Take 1 tablet (81 mg total) by mouth daily. Qty: 90 tablet, Refills: 0    Brimonidine Tartrate-Timolol (COMBIGAN OP) Place 1 drop into both eyes 2 (two) times daily.     carvedilol (COREG) 6.25 MG tablet Take 6.25 mg by mouth 2 (two) times daily with a meal.    cilostazol (PLETAL) 50 MG tablet Take 1 tablet (50 mg total) by mouth 2 (two) times daily. Qty: 60 tablet, Refills: 3    dorzolamide (TRUSOPT) 2 % ophthalmic solution Place 1 drop into the right eye 2 (two) times daily.    glimepiride (AMARYL) 2 MG tablet Take 2 mg by mouth daily with breakfast.     hydroxypropyl methylcellulose (ISOPTO TEARS) 2.5 % ophthalmic solution Place 1 drop into both eyes as needed for dry eyes.     nitroGLYCERIN (NITROSTAT) 0.4 MG SL tablet Place 1 tablet (0.4 mg total) under the tongue every 5 (five) minutes as needed for chest pain. Qty: 25 tablet, Refills: 3   Associated Diagnoses: Hypercholesteremia; Chest pain, unspecified chest pain type    sertraline (ZOLOFT) 50 MG tablet Take 100 mg by mouth daily.       STOP taking these medications     simvastatin (ZOCOR) 20 MG tablet          Aspirin prescribed at discharge?  Yes High Intensity Statin Prescribed? (Lipitor 40-80mg  or Crestor 20-40mg ): Yes Beta Blocker Prescribed? Yes For EF 45% or less, Was ACEI/ARB Prescribed? No: n/a ADP Receptor Inhibitor Prescribed? (i.e. Plavix etc.-Includes Medically Managed Patients): Yes For EF <40%, Aldosterone Inhibitor Prescribed? No: n/a Was EF assessed during THIS hospitalization? Yes Was Cardiac Rehab II ordered? (Included Medically managed Patients): Yes   Outstanding Labs/Studies   BMET   Duration of Discharge Encounter   Greater than 30 minutes including physician time.  SignedAngelena Form R PA-C 02/22/2015, 9:55 AM Patient seen and examined. I agree with the assessment and plan as detailed above. See also my  additional thoughts below.   I made the decision for discharge. See the progress note also. I agree with the discharge note as outlined.  Dola Argyle, MD, [facility] 02/22/2015 11:27 AM

## 2015-02-22 NOTE — Care Management Note (Addendum)
Case Management Note  Patient Details  Name: LAURINE KUYPER MRN: [id number] Date of Birth: [birth year]  Subjective/Objective:     Patient lives with spouse , pta indep.  Patient has Brinlinta  30 day savings card and [facility] [address]  Has in stock. Patient has a $400 deductible/ $175 has been paid towards this. Patient will pay full cost of medications until deductible is satisfied   Brilinta $272.55 until deductible is met/ no auth required  , this information given to patient.             Action/Plan:   Expected Discharge Date:                  Expected Discharge Plan:  Home/Self Care  In-House Referral:     Discharge planning Services  CM Consult  Post Acute Care Choice:    Choice offered to:     DME Arranged:    DME Agency:     HH Arranged:    [place] Agency:     Status of Service:  Completed, signed off  Medicare Important Message Given:    Date Medicare IM Given:    Medicare IM give by:    Date Additional Medicare IM Given:    Additional Medicare Important Message give by:     If discussed at [place] of Stay Meetings, dates discussed:    Additional Comments:  Zenon Mayo, RN 02/22/2015, 11:12 AM

## 2015-02-27 NOTE — Progress Notes (Signed)
Cardiology Office Note   Date:  02/28/2015   ID:  Taylor [name], DOB [birth year], MRN [id number]  PCP:   Melinda Crutch, MD  Cardiologist: Dr. Aundra Dubin Dr [name] ([place])   [place] hospital follow up    History of Present Illness: Taylor [name] is a 70 y.o. female with a history of CAD s/p CABG (2006), HTN, HLD, T2DM, remote tobacco abuse, and PAD with known bilateral popliteal occlusions who presents to clinic for post hospital follow up after recent admission for [place] s/p DES to [facility].  She presented to [facility] ED on 02/21/15 with chest pain concerning for [place]. She was taken back for cardiac catheterization on the day of presentation which revealed bypass graft failure with occlusion of the SVG--> dRCA and sequential SVG--> diag/OM. Patent free LIMA to the LAD as well as severe native vessel coronary disease with 99% mid RCA, 75% ostial PDA, total occlusion of the ostial LAD, and segmental 50-75% stenosis in the mid circumflex/obtuse marginal. The distal right coronary receives collaterals from left-to-right. The LCx OM lesion was assessed with FFR and found to be hemodynamically insignificant with a value of 0.84. She is S/P successful PCI/DES of mid RCA. Normal LV function and hemodynamics. EF is 55%. Placed on aspirin and Brilinta for at least 6 months to a year. She was noted to be mildly hypertensive during her admission and lisinopril 5 mg was added on the day of discharge. This somehow did not get to her pharmacy but her BP is good today so we will hold off for now.  Today she presents to clinic for follow up. She has had no chest pain but some mild SOB. She sometimes feels like she has to take a deep breath in. No LE edema, orthopnea or PND. No dizziness or syncope. No blood in her stool or urine. She did have some nosebleeds that were difficult to stop but have now resolved.     Past Medical History  Diagnosis Date  . Coronary artery disease     a. 2006 s/p CABG x 4 (LIMA->LAD, VG->Diag->OM,  VG->RPDA;  b. 07/2014 MV: EF 51%, medium defect of mild severity, likely artifact, low risk.   c. 02/2015: [place] s/p DES to [facility]  . Hypercholesteremia   . Hypertensive heart disease   . Reflux esophagitis   . Depression   . Pulmonary embolism ([place]) a. 2014.  [name] Hx of tobacco use, presenting hazards to health     a. quit 2006.  [name] PAD (peripheral artery disease) ([place])     a. 11/2014 ABI: R: 0.63, L 0.59;  b. 11/2014 Periph Angio: bilat pop occlusions. L - short w/ reconstituion via collats in dist pop w/ 2 vessel runoff, R long w/ reconstitution in prox tib/peroneal arteries-->med Rx w/ pletal.  . Left carotid bruit     a. 09/2014 Carotid U/S: 1-39% bilat ICA stenosis.  . Complication of anesthesia   . Hypertension   . Type II diabetes mellitus ([place])   . Cancer of left breast [facility])     Past Surgical History  Procedure Laterality Date  . Bunionectomy Left 1970s  . Cataract extraction w/ intraocular lens  implant, bilateral Bilateral 1980s-1990s  . Retinal detachment surgery Right 1990s  . Eye surgery    . Peripheral vascular catheterization N/A 12/12/2014    Procedure: Abdominal Aortogram;  Surgeon: Wellington Hampshire, MD;  Location: [place] CV LAB;  Service: Cardiovascular;  Laterality: N/A;  . Cardiac catheterization  04/2004  .  Coronary angioplasty with stent placement  02/21/2015    "1 stent"  . Coronary artery bypass graft  05/2004    "CABG X4"  . Laparoscopic cholecystectomy  1990s  . Breast biopsy Left ~ 2012  . Mastectomy Left ~ 2012  . Cardiac catheterization N/A 02/21/2015    Procedure: Left Heart Cath and Cors/Grafts Angiography;  Surgeon: Belva Crome, MD;  Location: [place] [facility];  Service: Cardiovascular;  Laterality: N/A;  . Cardiac catheterization N/A 02/21/2015    Procedure: Coronary Stent Intervention;  Surgeon: Belva Crome, MD;  Location: [place] CV [facility];  Service: Cardiovascular;  Laterality: N/A;  . Cardiac catheterization N/A 02/21/2015    Procedure:  Intravascular Pressure Wire/FFR Study;  Surgeon: Belva Crome, MD;  Location: [place] [facility];  Service: Cardiovascular;  Laterality: N/A;     Current Outpatient Prescriptions  Medication Sig Dispense Refill  . acetaminophen (TYLENOL) 500 MG tablet Take 1,000 mg by mouth every 6 (six) hours as needed for pain.    [name] ALPRAZolam (XANAX) 0.25 MG tablet Take 0.25 mg by mouth 3 (three) times daily as needed for anxiety. For anxiety    . aspirin EC 81 MG tablet Take 1 tablet (81 mg total) by mouth daily. 90 tablet 0  . Brimonidine Tartrate-Timolol (COMBIGAN OP) Place 1 drop into both eyes 2 (two) times daily.     . carvedilol (COREG) 6.25 MG tablet Take 6.25 mg by mouth 2 (two) times daily with a meal.    . cilostazol (PLETAL) 50 MG tablet Take 1 tablet (50 mg total) by mouth 2 (two) times daily. 60 tablet 3  . dorzolamide (TRUSOPT) 2 % ophthalmic solution Place 1 drop into the right eye 2 (two) times daily.    . hydroxypropyl methylcellulose (ISOPTO TEARS) 2.5 % ophthalmic solution Place 1 drop into both eyes as needed for dry eyes.     . metFORMIN (GLUCOPHAGE) 500 MG tablet Take 500 mg by mouth 2 (two) times daily with a meal.    . nitroGLYCERIN (NITROSTAT) 0.4 MG SL tablet Place 0.4 mg under the tongue every 5 (five) minutes as needed for chest pain (x 3 doses).    . sertraline (ZOLOFT) 50 MG tablet Take 100 mg by mouth daily.     . ticagrelor (BRILINTA) 90 MG TABS tablet Take 1 tablet (90 mg total) by mouth 2 (two) times daily. 180 tablet 6   No current facility-administered medications for this visit.   Facility-Administered Medications Ordered in Other Visits  Medication Dose Route Frequency Provider Last Rate Last Dose  . aminophylline injection 75 mg  75 mg Intravenous BID PRN Larey Dresser, MD   75 mg at 08/08/14 1335    Allergies:   Lipitor and Niacin    Social History:  The patient  reports that she quit smoking about 10 years ago. Her smoking use included Cigarettes. She has a  7.5 pack-year smoking history. She has never used smokeless tobacco. She reports that she does not drink alcohol or use illicit drugs.   Family History:  The patient's family history includes Cancer in her sister; Coronary artery disease (age of onset: 77) in her sister; Coronary artery disease (age of onset: 82) in her mother; Deep vein thrombosis in her cousin; Heart attack (age of onset: 46) in her father.    ROS:  Please see the history of present illness.   Otherwise, review of systems are positive for NONE.   All other systems are reviewed  and negative.    PHYSICAL EXAM: VS:  BP 142/70 mmHg  Pulse 52  Ht 5\' 7"  (1.702 m)  Wt 157 lb 3.2 oz (71.305 kg)  BMI 24.62 kg/m2 , BMI Body mass index is 24.62 kg/(m^2). GEN: Well nourished, well developed, in no acute distress HEENT: normal Neck: no JVD, carotid bruits, or masses Cardiac: RRR; no murmurs, rubs, or gallops,no edema  Respiratory:  clear to auscultation bilaterally, normal work of breathing GI: soft, nontender, nondistended, + BS MS: no deformity or atrophy Skin: warm and dry, no rash Neuro:  Strength and sensation are intact Psych: euthymic mood, full affect   EKG:  EKG is ordered today. The ekg ordered today demonstrates HR 52. Sinus brady LAD. IRBBB   Recent Labs: 12/20/2014: ALT <9 02/22/2015: BUN 10; Creatinine, Ser 0.88; Hemoglobin 13.9; Platelets 252; Potassium 4.3; Sodium 140    Lipid Panel    Component Value Date/Time   CHOL 92 09/10/2014 0841   TRIG 64.0 09/10/2014 0841   HDL 29.70* 09/10/2014 0841   CHOLHDL 3 09/10/2014 0841   VLDL 12.8 09/10/2014 0841   LDLCALC 49 09/10/2014 0841      Wt Readings from Last 3 Encounters:  02/28/15 157 lb 3.2 oz (71.305 kg)  02/22/15 165 lb 12.6 oz (75.2 kg)  01/01/15 158 lb 1.9 oz (71.723 kg)      Other studies Reviewed: Additional studies/ records that were reviewed today include: LHC Review of the above records demonstrates:  02/21/15 Conclusion     1. Ost RPDA lesion, 75% stenosed. 2. Mid RCA lesion, 99% stenosed. 3. 1st Mrg lesion, 70% stenosed. The lesion was not previously treated. 4. Ost LAD lesion, 100% stenosed. 5. Sequential SVG . 6. Origin to Prox Graft lesion, before 1st Diag, 100% stenosed. 7. LIMA was injected is normal in caliber, and is anatomically normal. 8. Dist LAD lesion, 99% stenosed. 9. SVG was injected . 10. There is severe disease in the graft. 11. Origin lesion, 100% stenosed.   Acute coronary syndrome/unstable angina presentation in a 70 year old patient with remote history of coronary bypass grafting (2006).  Bypass graft failure with occlusion of the saphenous vein graft to the distal right coronary, occlusion of the sequential saphenous vein graft to the diagonal and obtuse marginal. Patent free LIMA to the LAD.   Severe native vessel coronary disease with 99% mid RCA, 75% ostial PDA, total occlusion of the ostial LAD, and segmental 50-75% stenosis in the mid circumflex/obtuse marginal. The distal right coronary receives collaterals from left-to-right.  Successful PCI and stent of the mid RCA from 99% to 0% with TIMI grade 3 flow.  The circumflex obtuse marginal lesion was assessed with FFR and was found to be hemodynamically insignificant with a value of 0.84.  Normal left ventricular function and hemodynamics. EF is 55%. RECOMMENDATIONS:  Aspirin and Brilinta for at least 6 months and preferably a year.  If no groin complication or other clinical issues, anticipate discharge tomorrow.               ASSESSMENT AND PLAN:  Taylor [name] is a 70 y.o. female with a history of CAD s/p CABG (2006), HTN, HLD, T2DM, remote tobacco abuse, and PAD with known bilateral popliteal occlusions who presents to clinic for post hospital follow up after recent admission for [place] s/p DES to [facility].  CAD/[place]: She is s/p LHC on 02/21/15 which revealed bypass graft failure with occlusion of the SVG--> dRCA and  sequential SVG--> diag/OM. Patent free LIMA to  the LAD as well as severe native vessel coronary disease with 99% mid RCA, 75% ostial PDA, total occlusion of the ostial LAD, and segmental 50-75% stenosis in the mid circumflex/obtuse marginal. The distal right coronary receives collaterals from left-to-right. The LCx OM lesion was assessed with FFR and found to be hemodynamically insignificant with a value of 0.84. She is S/P successful PCI/DES of mid RCA. Normal LV function and hemodynamics. EF is 55%.  -- Placed on aspirin and Brilinta for at least 6 months to a year. Having some mild SOB that may be related to the [place]. She will try to take with some caffeine and see if this helps.  -- Continue BB and statin  Hypertensive heart disease: Blood controlled today. Continue Coreg 6.25 mg twice a day.  She was noted to be mildly hypertensive during her admission and lisinopril 5 mg was added on the day of discharge. This somehow did not get to her pharmacy but her BP is good today so we will hold off for now. She will watch her BP at home and if elevated she will call us and we can add lisinopril 5mg  daily.   HLD: LDL was 49 in August 2016. LFTs were normal in 12/2014. She had an adverse reaction to Lipitor and started back on previous home simvastatin 20mg  daily  PAD: Known bilateral popliteal occlusions with distal reconstitution. She remains on aspirin and Pletal and has been symptomatically stable.  T2DM: Continue home regimen.   Carotid artery disease: Carotid U/S: 1-39% bilat ICA stenosis (09/2014).  Current medicines are reviewed at length with the patient today.  The patient has concerns regarding medicines.  The following changes have been made:  no change  Labs/ tests ordered today include:  No orders of the defined types were placed in this encounter.     Disposition:   FU wit Dr. Aundra Dubin in 6-8 weeks  Signed, Crista Luria  02/28/2015 2:22 PM    [facility] [place], [place], [place]  [address] Phone: [phone]; Fax: [phone]

## 2015-02-28 ENCOUNTER — Ambulatory Visit (INDEPENDENT_AMBULATORY_CARE_PROVIDER_SITE_OTHER): Payer: Medicare Other | Admitting: Physician Assistant

## 2015-02-28 ENCOUNTER — Encounter: Payer: Self-pay | Admitting: Physician Assistant

## 2015-02-28 VITALS — BP 142/70 | HR 52 | Ht 67.0 in | Wt 157.2 lb

## 2015-02-28 DIAGNOSIS — I25810 Atherosclerosis of coronary artery bypass graft(s) without angina pectoris: Secondary | ICD-10-CM | POA: Diagnosis not present

## 2015-02-28 DIAGNOSIS — I10 Essential (primary) hypertension: Secondary | ICD-10-CM

## 2015-02-28 MED ORDER — SIMVASTATIN 20 MG PO TABS: 20.0000 mg | ORAL_TABLET | Freq: Every day | ORAL | Status: DC

## 2015-02-28 NOTE — Patient Instructions (Signed)
Medication Instructions:   Your physician recommends that you continue on your current medications as directed. Please refer to the Current Medication list given to you today.   If you need a refill on your cardiac medications before your next appointment, please call your pharmacy.  Labwork: NONE ORDER TODAY    Testing/Procedures:  NONE ORDER TODAY    Follow-Up:  WITH DR MCLEAN IN 2 TO 3 MONTHS    Any Other Special Instructions Will Be Listed Below (If Applicable).

## 2015-03-01 ENCOUNTER — Telehealth: Payer: Self-pay | Admitting: Cardiology

## 2015-03-01 NOTE — Telephone Encounter (Signed)
New Message  Pt calling to speawk w/ RN concerning more information about procedure she had. Please call back and discuss.

## 2015-03-01 NOTE — Telephone Encounter (Signed)
Left message. Calling to give patient the information from Dr. Aundra Dubin

## 2015-03-01 NOTE — Telephone Encounter (Signed)
No intervention on her occluded vein graft.  Had stent to her native right coronary artery.

## 2015-03-01 NOTE — Telephone Encounter (Signed)
Patient would like to know if they had done any treatment to her 100% occluded graph during cardiac cath I don't see where there was an intervention on the 100 % occluded graph.  Could you advise?  The patient wanted to share this with retinal specialist

## 2015-03-01 NOTE — Telephone Encounter (Signed)
Patient and her husband given Dr. Claris Gladden message

## 2015-03-14 DIAGNOSIS — H40013 Open angle with borderline findings, low risk, bilateral: Secondary | ICD-10-CM | POA: Diagnosis not present

## 2015-03-14 DIAGNOSIS — H31091 Other chorioretinal scars, right eye: Secondary | ICD-10-CM | POA: Diagnosis not present

## 2015-03-14 DIAGNOSIS — Z961 Presence of intraocular lens: Secondary | ICD-10-CM | POA: Diagnosis not present

## 2015-03-21 ENCOUNTER — Telehealth: Payer: Self-pay | Admitting: Cardiology

## 2015-03-21 NOTE — Telephone Encounter (Signed)
Medication samples have been provided to the patient.  Drug name: brilinta 90mg   Qty: 6 bottles (48 tablets) LOT: [id number]  Exp.Date: 10/2017  LM for patient to call back to inform her we have samples for her to pick up Will send message to Jenean Lindau to see if patient qualifies for patient assistance before changing to plavix per MD Per cath/stent were on 02/21/15 - she is one month out today 03/21/15

## 2015-03-21 NOTE — Telephone Encounter (Signed)
Pt says the Brilinta is too expensive,is there something else she can take in the place of it?

## 2015-03-21 NOTE — Telephone Encounter (Signed)
Returned call to patient who has Medicare Part D and Brilinta is going to cost her more than $250/month.  She states she does not want to make any changes to her meds that will affect her stent Explained that all Medicare patients have a prescription drug deductible that has to be met before insurance will kick in and pick up more of the expense.  She states she is aware of this but she also tends to go in the donut hole already around October d/t her prescription eye drops Advised any med changes would have to be made by the MD and I would route the message to Dr. Aundra Dubin to review   She has enough of the medication to last until Sunday AM - there are samples available at the [facility] office in case patient will need to pick these up if med change is not appropriate at this time.

## 2015-03-21 NOTE — Telephone Encounter (Signed)
Would have her complete at least 1 month of Brilinta post-PCI before making any changes.  After 1 month, she can switch to Plavix if there is no other way for her to get Brilinta in an affordable fashion.  Would coordinate with pharmacist conversion from Brilinta to Plavix after 1 month if this is necessary.

## 2015-03-21 NOTE — Telephone Encounter (Signed)
Patient returned call. Explained we have samples for her to pick up and we will have Vaughan Basta, LPN look in to patient assistance for Brilinta for her before making med changes. She voiced understanding and agrees with plan.

## 2015-03-26 NOTE — Telephone Encounter (Signed)
Patient Assistance Forms for Brilinta mailed to patient.

## 2015-03-27 DIAGNOSIS — C44321 Squamous cell carcinoma of skin of nose: Secondary | ICD-10-CM | POA: Diagnosis not present

## 2015-03-28 ENCOUNTER — Telehealth (HOSPITAL_COMMUNITY): Payer: Self-pay | Admitting: *Deleted

## 2015-03-28 NOTE — Telephone Encounter (Signed)
Message left to please contact  cardiac rehab to sign up for one of the classes.  Contact information provided. Cherre Huger, BSN

## 2015-04-18 ENCOUNTER — Telehealth: Payer: Self-pay | Admitting: *Deleted

## 2015-04-18 ENCOUNTER — Telehealth: Payer: Self-pay | Admitting: Cardiology

## 2015-04-18 NOTE — Telephone Encounter (Signed)
Patient is coming today to pick up Brilinta samples. Per Vaughan Basta, RN, instructed patient to have her paged when she arrives to finish patient assistance program paperwork. Patient agrees with treatment plan.

## 2015-04-18 NOTE — Telephone Encounter (Signed)
New Message  Pt requested to speak w/ RN concerning her meds. Please call back and discuss.

## 2015-04-18 NOTE — Telephone Encounter (Signed)
Spoke with patient when she picked up samples. She tells me she dropped off her portion of the Assistance paperwork about a week or 2 ago. I told her I had not seen it, but that I would certainly look throughout the office. She said it was in an envelope titled, " for Dr. Claris Gladden nurse".

## 2015-04-18 NOTE — Telephone Encounter (Signed)
Brilinta samples placed at the front desk for the patient. I provided the number for the brilinta patient assistance program for her to call and follow up on her application.

## 2015-04-23 ENCOUNTER — Telehealth: Payer: Self-pay | Admitting: Cardiology

## 2015-04-23 NOTE — Telephone Encounter (Signed)
Spoke with patient she is aware [name] has [employer] paperwork

## 2015-04-23 NOTE — Telephone Encounter (Signed)
Assistance paperwork was found by K Mesiemore. She is notifying patient's husband. I have faxed everything together to [employer].

## 2015-04-25 DIAGNOSIS — H31091 Other chorioretinal scars, right eye: Secondary | ICD-10-CM | POA: Diagnosis not present

## 2015-04-25 DIAGNOSIS — H40013 Open angle with borderline findings, low risk, bilateral: Secondary | ICD-10-CM | POA: Diagnosis not present

## 2015-04-25 DIAGNOSIS — Z961 Presence of intraocular lens: Secondary | ICD-10-CM | POA: Diagnosis not present

## 2015-04-29 ENCOUNTER — Other Ambulatory Visit: Payer: Self-pay | Admitting: Cardiovascular Disease

## 2015-04-29 NOTE — Telephone Encounter (Signed)
Please review for refill. Thanks!  

## 2015-05-08 ENCOUNTER — Telehealth: Payer: Self-pay | Admitting: Cardiology

## 2015-05-08 NOTE — Telephone Encounter (Signed)
[name] is calling about her Brilinta , she is wanting to know should she keep getting samples or get a prescription for the medication . Please call  Thanks

## 2015-05-08 NOTE — Telephone Encounter (Signed)
Patient has not hear from [employer], so patient stated to go ahead and send prescription in for Brilinta. Informed patient that prescription has been sent to her pharmacy back on 02/22/15. Encouraged patient to call back if pharmacy does not have her prescription. Patient verbalized understanding and will call back if prescription is not at pharmacy.

## 2015-05-08 NOTE — Telephone Encounter (Signed)
Patient call back. Pharmacy stated that insurance will not fill prescription for brilinta, because patient is on cilostazol. Will forward to Dr. [name] to see if he wants patient to take both medciations or take one over the other.

## 2015-05-09 ENCOUNTER — Telehealth: Payer: Self-pay

## 2015-05-09 ENCOUNTER — Telehealth: Payer: Self-pay | Admitting: Cardiology

## 2015-05-09 NOTE — Telephone Encounter (Signed)
Called patient about Dr. Tyrell Antonio message. Instructed patient to stop Cilostazol and continue Brilinta. Patient verbalized understanding.

## 2015-05-09 NOTE — Addendum Note (Signed)
Addended by: Aris Georgia, Enriqueta Augusta L on: 05/09/2015 08:24 AM   Modules accepted: Orders, Medications

## 2015-05-09 NOTE — Telephone Encounter (Signed)
Brilinta 90 mg is good only through 08/08/2015, per [facility].

## 2015-05-09 NOTE — Telephone Encounter (Signed)
Left message for patient that Brilinta was approved by [facility]. Also notified local pharmacy that it was approved.

## 2015-05-09 NOTE — Telephone Encounter (Signed)
Pt calling re status of fax sent over yesterday from insurance co regarding the Brilinta-can get it filled until this is done-pls call

## 2015-05-09 NOTE — Telephone Encounter (Signed)
Follow Up   Pt calling again to make sure that a message was sent. Pt states that she just took her last pill of Brillanta. She said that her insurance informed her that she would need a prior Auth.

## 2015-05-09 NOTE — Telephone Encounter (Signed)
She had a coronary stent placement recently. Stop Cilostazol and continue Brilinta.

## 2015-05-09 NOTE — Telephone Encounter (Signed)
Prior auth for Brilinta 90mg sent to [employer]. 

## 2015-06-03 DIAGNOSIS — X32XXXD Exposure to sunlight, subsequent encounter: Secondary | ICD-10-CM | POA: Diagnosis not present

## 2015-06-03 DIAGNOSIS — Z85828 Personal history of other malignant neoplasm of skin: Secondary | ICD-10-CM | POA: Diagnosis not present

## 2015-06-03 DIAGNOSIS — L820 Inflamed seborrheic keratosis: Secondary | ICD-10-CM | POA: Diagnosis not present

## 2015-06-03 DIAGNOSIS — L570 Actinic keratosis: Secondary | ICD-10-CM | POA: Diagnosis not present

## 2015-06-03 DIAGNOSIS — Z08 Encounter for follow-up examination after completed treatment for malignant neoplasm: Secondary | ICD-10-CM | POA: Diagnosis not present

## 2015-06-04 ENCOUNTER — Ambulatory Visit (INDEPENDENT_AMBULATORY_CARE_PROVIDER_SITE_OTHER): Payer: Medicare Other | Admitting: Cardiology

## 2015-06-04 ENCOUNTER — Encounter: Payer: Self-pay | Admitting: Cardiology

## 2015-06-04 VITALS — BP 140/60 | HR 59 | Ht 67.0 in | Wt 161.8 lb

## 2015-06-04 DIAGNOSIS — I2510 Atherosclerotic heart disease of native coronary artery without angina pectoris: Secondary | ICD-10-CM | POA: Diagnosis not present

## 2015-06-04 DIAGNOSIS — I6523 Occlusion and stenosis of bilateral carotid arteries: Secondary | ICD-10-CM

## 2015-06-04 DIAGNOSIS — I2583 Coronary atherosclerosis due to lipid rich plaque: Secondary | ICD-10-CM

## 2015-06-04 DIAGNOSIS — E7800 Pure hypercholesterolemia, unspecified: Secondary | ICD-10-CM | POA: Diagnosis not present

## 2015-06-04 DIAGNOSIS — R079 Chest pain, unspecified: Secondary | ICD-10-CM | POA: Diagnosis not present

## 2015-06-04 DIAGNOSIS — I10 Essential (primary) hypertension: Secondary | ICD-10-CM

## 2015-06-04 MED ORDER — LISINOPRIL 5 MG PO TABS: 5.0000 mg | ORAL_TABLET | Freq: Every day | ORAL | Status: DC

## 2015-06-04 NOTE — Patient Instructions (Signed)
Medication Instructions:  Start lisinopril 5mg  daily  Labwork: Your physician recommends that you return for a FASTING lipid profile /BMET in about 2 weeks.    Testing/Procedures: Your physician has recommended that you have a sleep study. This test records several body functions during sleep, including: brain activity, eye movement, oxygen and carbon dioxide blood levels, heart rate and rhythm, breathing rate and rhythm, the flow of air through your mouth and nose, snoring, body muscle movements, and chest and belly movement.  Your physician has requested that you have a lexiscan myoview. For further information please visit [email or web address]. Please follow instruction sheet, as given.  Your physician has requested that you have a carotid duplex. This test is an ultrasound of the carotid arteries in your neck. It looks at blood flow through these arteries that supply the brain with blood. Allow one hour for this exam. There are no restrictions or special instructions. September 2017    Follow-Up: Your physician recommends that you schedule a follow-up appointment in: 6 weeks with Dr Aundra Dubin.       If you need a refill on your cardiac medications before your next appointment, please call your pharmacy.

## 2015-06-05 NOTE — Progress Notes (Signed)
Patient ID: [name], female   DOB: Jun 16, 1945, 70 y.o.   MRN: [id number] PCP: Dr. [name]  70 yo with history of CAD s/p CABG, PAD, and PE in 2/14 presents for cardiology followup.  She had CABG in 2006.  She was found to have PE in 2/14 without known trigger.  She saw her oncologist after that with no sign of recurrent cancer.  She had a negative hypercoagulable workup and was eventually told by her oncologist that she could stop warfarin.    She developed claudication and had peripheral angiography in 11/16.  She had bilateral popliteal occlusions that were not optimal for PCI.  Initially started on cilostazol, but this was stopped when she went on ticagrelor.   She was admitted with unstable angina in 2/17.  SVG-RCA and SVG-D/OM were occluded.  She had DES to 99% pRCA.  She had 50-75% proximal LCx stenosis and had FFR => negative, so no intervention.   She returns for followup.  Very anxious.  Feels a "rumbling" sensation in her chest, like a "heaviness."  No definite trigger.  She has profound fatigue in general.  She has bilateral calf pain and mild dyspnea after walking < 100 feet.  She snores and has daytime sleepiness.   Labs (8/14): K 4.2, creatinine 1.0 Labs (4/15): K 4.6, creatinine 0.8 Labs (4/16): K 3.7, creatinine 1.1 Labs (8/16): LDL 49, HDL 28, LFTs normal Labs (2/17): K 4.3, creatinine 0.88  PMH: 1. Depression 2. Breast cancer: s/p left mastectomy in 3/11.  3. PE [id number]: Uncertain etiology, no trigger.  Factor V Leiden negative, prothrombin gene mutation negative, but lupus anticoagulant positive.  Venous dopplers (8/14) with left greater saphenous vein superficial thrombosis.  Repeat antiphospholipid antibody workup negative in 8/14 and again in 1/15.  4. Hyperlipidemia: Myalgias with Crestor and atorvastatin.  5. Type II diabetes 6. HTN 7. Cholescystectomy 8. GERD 9. CAD: s/p CABG in 5/06 with LIMA-LAD, sequential SVG-D and OM, SVG-PDA.  Echo (8/14) with EF 60%, mild LVH,  normal RV.  Cardiolite (7/16) with small fixed lateral defect, no ischemia, EF 51% => low risk.  - Unstable angina (2/17): LHC with totally occluded SVG-dRCA and sequential SVG-D/OM, LIMA-LAD patent, 99% mRCA, 75% PDA, totally occluded LAD, 50-75% mLCx => FFR LCx negative, PCI/DES to mRCA.  10. Carotid stenosis: carotids (8/14) with 40-59% bilateral ICA stenosis. Carotid dopplers (9/15) with 1-39% BICA stenosis.  11. Glaucoma 12. PAD: ABIs (10/16) 0.63 R, 0.59 L.  Peripheral angiography [name]) 11/16 with bilateral popliteal occlusion => lesion not optimal for PCI, so cilostazol started (later stopped with need for DAPT).   SH: Married, quit smoking in 2006, lives in [name].  FH: Mother and father with MIs, sister with CABG in 88.   ROS: All systems reviewed and negative except as per HPI.   Current Outpatient Prescriptions  Medication Sig Dispense Refill  . acetaminophen (TYLENOL) 500 MG tablet Take 1,000 mg by mouth every 6 (six) hours as needed for pain.    [name] ALPRAZolam (XANAX) 0.25 MG tablet Take 0.25 mg by mouth 3 (three) times daily as needed for anxiety. For anxiety    . aspirin EC 81 MG tablet Take 1 tablet (81 mg total) by mouth daily. 90 tablet 0  . Brimonidine Tartrate-Timolol (COMBIGAN OP) Place 1 drop into both eyes 2 (two) times daily.     . carvedilol (COREG) 6.25 MG tablet Take 6.25 mg by mouth 2 (two) times daily with a meal.    . dorzolamide (  TRUSOPT) 2 % ophthalmic solution Place 1 drop into the right eye 2 (two) times daily.    . hydroxypropyl methylcellulose (ISOPTO TEARS) 2.5 % ophthalmic solution Place 1 drop into both eyes as needed for dry eyes.     . metFORMIN (GLUCOPHAGE) 500 MG tablet Take 500 mg by mouth 2 (two) times daily with a meal.    . nitroGLYCERIN (NITROSTAT) 0.4 MG SL tablet Place 0.4 mg under the tongue every 5 (five) minutes as needed for chest pain (x 3 doses).    . sertraline (ZOLOFT) 50 MG tablet Take 100 mg by mouth daily.     . simvastatin  (ZOCOR) 20 MG tablet Take 1 tablet (20 mg total) by mouth daily. 30 tablet 0  . ticagrelor (BRILINTA) 90 MG TABS tablet Take 1 tablet (90 mg total) by mouth 2 (two) times daily. 180 tablet 6  . glimepiride (AMARYL) 2 MG tablet Take 1 tablet (2 mg total) by mouth daily with breakfast.    . lisinopril (PRINIVIL,ZESTRIL) 5 MG tablet Take 1 tablet (5 mg total) by mouth daily. 90 tablet 0   No current facility-administered medications for this visit.   Facility-Administered Medications Ordered in Other Visits  Medication Dose Route Frequency Provider Last Rate Last Dose  . aminophylline injection 75 mg  75 mg Intravenous BID PRN [place] Dresser, MD   75 mg at 08/08/14 1335    BP 140/60 mmHg  Pulse 59  Ht 5\' 7"  (1.702 m)  Wt 161 lb 12.8 oz (73.392 kg)  BMI 25.34 kg/m2  SpO2 96% General: NAD Neck: No JVD, no thyromegaly or thyroid nodule.  Lungs: Clear to auscultation bilaterally with normal respiratory effort. CV: Nondisplaced PMI.  Heart regular S1/S2, no S3/S4, no murmur.  No peripheral edema.  B carotid bruits.  Absent pedal pulses.  Abdomen: Soft, nontender, no hepatosplenomegaly, no distention.  Skin: Intact without lesions or rashes.  Neurologic: Alert and oriented x 3.  Psych: Normal affect. Extremities: No clubbing or cyanosis.   Assessment/Plan: 1. CAD: Stable s/p CABG.  Recent DES to mRCA (2/17).  She had a residual moderate LCx stenosis that was not hemodynamically significant by FFR.  She has been having some chest heaviness, no clear trigger, and profound fatigue.  - I will arrange for Lexiscan Cardiolite.  Will look specifically for ischemia in the LCx territory.  - Continue ASA 81, ticagrelor, statin.  - I would like her to do cardiac rehab if Cardiolite is ok.  2. PE: PE in 2/14.  On initial hypercoagulable workup, lupus anticoagulant was positive, raising concern for antiphospholipid antibody syndrome.  However, repeat antiphospholipid antibody panels in 8/14 and 1/15 were  negative and her oncologist let her stop warfarin.  3. Carotid bruit: Carotid dopplers due in 9/17.  4. Hyperlipidemia: Check lipids today.  She cannot tolerate Crestor or atorvastatin due to myalgias.  She can take Zocor. If LDL is too high, add Zetia.   5. PAD: Known severe PAD.  Stable claudication.  Sees Dr [name], has followup.  She is not on cilostazol given DAPT.  No rest pain or pedal ulcerations.  I recommended that she try to walk through calf pain for at least a short distance.  6. Suspect OSA: I will arrange for sleep study.  7. HTN: Add lisinopril 5 mg daily, BMET in 2 wks.   Followup in 6 wks.    Loralie Champagne 06/05/2015

## 2015-06-06 ENCOUNTER — Other Ambulatory Visit: Payer: Self-pay | Admitting: Cardiology

## 2015-06-06 ENCOUNTER — Telehealth (HOSPITAL_COMMUNITY): Payer: Self-pay | Admitting: *Deleted

## 2015-06-06 DIAGNOSIS — R079 Chest pain, unspecified: Secondary | ICD-10-CM

## 2015-06-06 NOTE — Telephone Encounter (Signed)
Patient given detailed instructions per Myocardial Perfusion Study Information Sheet for the test on 06/12/15. Patient notified to arrive 15 minutes early and that it is imperative to arrive on time for appointment to keep from having the test rescheduled.  If you need to cancel or reschedule your appointment, please call the office within 24 hours of your appointment. Failure to do so may result in a cancellation of your appointment, and a $50 no show fee. Patient verbalized understanding. Hubbard Robinson, RN

## 2015-06-12 ENCOUNTER — Ambulatory Visit (HOSPITAL_COMMUNITY): Payer: Medicare Other | Attending: Cardiovascular Disease

## 2015-06-12 DIAGNOSIS — I779 Disorder of arteries and arterioles, unspecified: Secondary | ICD-10-CM | POA: Diagnosis not present

## 2015-06-12 DIAGNOSIS — E119 Type 2 diabetes mellitus without complications: Secondary | ICD-10-CM | POA: Insufficient documentation

## 2015-06-12 DIAGNOSIS — I10 Essential (primary) hypertension: Secondary | ICD-10-CM | POA: Diagnosis not present

## 2015-06-12 DIAGNOSIS — G4440 Drug-induced headache, not elsewhere classified, not intractable: Secondary | ICD-10-CM

## 2015-06-12 DIAGNOSIS — R0602 Shortness of breath: Secondary | ICD-10-CM

## 2015-06-12 DIAGNOSIS — R0789 Other chest pain: Secondary | ICD-10-CM

## 2015-06-12 DIAGNOSIS — R9439 Abnormal result of other cardiovascular function study: Secondary | ICD-10-CM | POA: Diagnosis not present

## 2015-06-12 DIAGNOSIS — R5383 Other fatigue: Secondary | ICD-10-CM | POA: Insufficient documentation

## 2015-06-12 DIAGNOSIS — R079 Chest pain, unspecified: Secondary | ICD-10-CM | POA: Insufficient documentation

## 2015-06-12 LAB — MYOCARDIAL PERFUSION IMAGING
CHL CUP NUCLEAR SDS: 1
CHL CUP NUCLEAR SRS: 5
CHL CUP NUCLEAR SSS: 6
LHR: 0.32
LV sys vol: 61 mL
LVDIAVOL: 125 mL (ref 46–106)
NUC STRESS TID: 1.14
Peak HR: 75 {beats}/min
Rest HR: 47 {beats}/min

## 2015-06-12 MED ORDER — AMINOPHYLLINE 25 MG/ML IV SOLN
75.0000 mg | Freq: Once | INTRAVENOUS | Status: AC
  Administered 2015-06-12: 75 mg via INTRAVENOUS

## 2015-06-12 MED ORDER — TECHNETIUM TC 99M TETROFOSMIN IV KIT
10.3000 | PACK | Freq: Once | INTRAVENOUS | Status: AC | PRN
  Administered 2015-06-12: 10 via INTRAVENOUS
  Filled 2015-06-12: qty 10

## 2015-06-12 MED ORDER — REGADENOSON 0.4 MG/5ML IV SOLN
0.4000 mg | Freq: Once | INTRAVENOUS | Status: AC
  Administered 2015-06-12: 0.4 mg via INTRAVENOUS

## 2015-06-12 MED ORDER — TECHNETIUM TC 99M TETROFOSMIN IV KIT
31.0000 | PACK | Freq: Once | INTRAVENOUS | Status: AC | PRN
  Administered 2015-06-12: 31 via INTRAVENOUS
  Filled 2015-06-12: qty 31

## 2015-06-14 ENCOUNTER — Encounter: Payer: Self-pay | Admitting: *Deleted

## 2015-06-14 ENCOUNTER — Other Ambulatory Visit: Payer: Self-pay | Admitting: *Deleted

## 2015-06-14 DIAGNOSIS — I2583 Coronary atherosclerosis due to lipid rich plaque: Secondary | ICD-10-CM

## 2015-06-14 DIAGNOSIS — I2510 Atherosclerotic heart disease of native coronary artery without angina pectoris: Secondary | ICD-10-CM

## 2015-06-14 DIAGNOSIS — R079 Chest pain, unspecified: Secondary | ICD-10-CM

## 2015-06-14 DIAGNOSIS — I10 Essential (primary) hypertension: Secondary | ICD-10-CM

## 2015-06-18 ENCOUNTER — Other Ambulatory Visit: Payer: Medicare Other

## 2015-06-18 DIAGNOSIS — I10 Essential (primary) hypertension: Secondary | ICD-10-CM | POA: Diagnosis not present

## 2015-06-18 DIAGNOSIS — E7800 Pure hypercholesterolemia, unspecified: Secondary | ICD-10-CM | POA: Diagnosis not present

## 2015-06-18 DIAGNOSIS — I6529 Occlusion and stenosis of unspecified carotid artery: Secondary | ICD-10-CM | POA: Diagnosis not present

## 2015-06-18 DIAGNOSIS — I2510 Atherosclerotic heart disease of native coronary artery without angina pectoris: Secondary | ICD-10-CM | POA: Diagnosis not present

## 2015-06-18 DIAGNOSIS — R079 Chest pain, unspecified: Secondary | ICD-10-CM

## 2015-06-18 DIAGNOSIS — I2583 Coronary atherosclerosis due to lipid rich plaque: Secondary | ICD-10-CM

## 2015-06-18 DIAGNOSIS — I6523 Occlusion and stenosis of bilateral carotid arteries: Secondary | ICD-10-CM | POA: Diagnosis not present

## 2015-06-18 LAB — CBC WITH DIFFERENTIAL/PLATELET
BASOS PCT: 1 %
Basophils Absolute: 77 cells/uL (ref 0–200)
EOS ABS: 154 {cells}/uL (ref 15–500)
Eosinophils Relative: 2 %
HEMATOCRIT: 40.4 % (ref 35.0–45.0)
Hemoglobin: 13.4 g/dL (ref 11.7–15.5)
LYMPHS ABS: 1386 {cells}/uL (ref 850–3900)
Lymphocytes Relative: 18 %
MCH: 30.2 pg (ref 27.0–33.0)
MCHC: 33.2 g/dL (ref 32.0–36.0)
MCV: 91.2 fL (ref 80.0–100.0)
MONO ABS: 385 {cells}/uL (ref 200–950)
MPV: 10.1 fL (ref 7.5–12.5)
Monocytes Relative: 5 %
NEUTROS ABS: 5698 {cells}/uL (ref 1500–7800)
Neutrophils Relative %: 74 %
Platelets: 341 10*3/uL (ref 140–400)
RBC: 4.43 MIL/uL (ref 3.80–5.10)
RDW: 13.2 % (ref 11.0–15.0)
WBC: 7.7 10*3/uL (ref 3.8–10.8)

## 2015-06-18 LAB — LIPID PANEL
CHOLESTEROL: 136 mg/dL (ref 125–200)
HDL: 35 mg/dL — ABNORMAL LOW (ref >=46)
LDL Cholesterol: 81 mg/dL (ref <130)
Total CHOL/HDL Ratio: 3.9 Ratio (ref <=5.0)
Triglycerides: 100 mg/dL (ref <150)
VLDL: 20 mg/dL (ref <30)

## 2015-06-18 LAB — BASIC METABOLIC PANEL
BUN: 16 mg/dL (ref 7–25)
CALCIUM: 9.4 mg/dL (ref 8.6–10.4)
CO2: 21 mmol/L (ref 20–31)
CREATININE: 1.05 mg/dL — AB (ref 0.50–0.99)
Chloride: 106 mmol/L (ref 98–110)
GLUCOSE: 99 mg/dL (ref 65–99)
Potassium: 4.8 mmol/L (ref 3.5–5.3)
SODIUM: 137 mmol/L (ref 135–146)

## 2015-06-18 LAB — PROTIME-INR
INR: 0.96 (ref <1.50)
Prothrombin Time: 12.9 seconds (ref 11.6–15.2)

## 2015-06-20 ENCOUNTER — Encounter (HOSPITAL_COMMUNITY): Payer: Self-pay | Admitting: Cardiology

## 2015-06-20 ENCOUNTER — Encounter (HOSPITAL_COMMUNITY)
Admission: RE | Disposition: A | Discharge status: Home / Self Care | Payer: Self-pay | Source: Ambulatory Visit | Attending: Cardiology

## 2015-06-20 ENCOUNTER — Ambulatory Visit (HOSPITAL_COMMUNITY)
Admission: RE | Admit: 2015-06-20 | Discharge: 2015-06-20 | Disposition: A | Discharge status: Home / Self Care | Payer: Medicare Other | Source: Ambulatory Visit | Attending: Cardiology | Admitting: Cardiology

## 2015-06-20 DIAGNOSIS — Z951 Presence of aortocoronary bypass graft: Secondary | ICD-10-CM | POA: Insufficient documentation

## 2015-06-20 DIAGNOSIS — I739 Peripheral vascular disease, unspecified: Secondary | ICD-10-CM | POA: Diagnosis not present

## 2015-06-20 DIAGNOSIS — Z86711 Personal history of pulmonary embolism: Secondary | ICD-10-CM | POA: Insufficient documentation

## 2015-06-20 DIAGNOSIS — K219 Gastro-esophageal reflux disease without esophagitis: Secondary | ICD-10-CM | POA: Diagnosis not present

## 2015-06-20 DIAGNOSIS — Z853 Personal history of malignant neoplasm of breast: Secondary | ICD-10-CM | POA: Insufficient documentation

## 2015-06-20 DIAGNOSIS — F329 Major depressive disorder, single episode, unspecified: Secondary | ICD-10-CM | POA: Diagnosis not present

## 2015-06-20 DIAGNOSIS — Z7982 Long term (current) use of aspirin: Secondary | ICD-10-CM | POA: Insufficient documentation

## 2015-06-20 DIAGNOSIS — I6523 Occlusion and stenosis of bilateral carotid arteries: Secondary | ICD-10-CM | POA: Diagnosis not present

## 2015-06-20 DIAGNOSIS — I2510 Atherosclerotic heart disease of native coronary artery without angina pectoris: Secondary | ICD-10-CM | POA: Insufficient documentation

## 2015-06-20 DIAGNOSIS — Z87891 Personal history of nicotine dependence: Secondary | ICD-10-CM | POA: Diagnosis not present

## 2015-06-20 DIAGNOSIS — H409 Unspecified glaucoma: Secondary | ICD-10-CM | POA: Diagnosis not present

## 2015-06-20 DIAGNOSIS — E119 Type 2 diabetes mellitus without complications: Secondary | ICD-10-CM | POA: Diagnosis not present

## 2015-06-20 DIAGNOSIS — Z7984 Long term (current) use of oral hypoglycemic drugs: Secondary | ICD-10-CM | POA: Insufficient documentation

## 2015-06-20 DIAGNOSIS — E785 Hyperlipidemia, unspecified: Secondary | ICD-10-CM | POA: Insufficient documentation

## 2015-06-20 DIAGNOSIS — I10 Essential (primary) hypertension: Secondary | ICD-10-CM | POA: Insufficient documentation

## 2015-06-20 DIAGNOSIS — I2582 Chronic total occlusion of coronary artery: Secondary | ICD-10-CM | POA: Diagnosis not present

## 2015-06-20 HISTORY — PX: CARDIAC CATHETERIZATION: SHX172

## 2015-06-20 LAB — GLUCOSE, CAPILLARY: GLUCOSE-CAPILLARY: 107 mg/dL — AB (ref 65–99)

## 2015-06-20 SURGERY — LEFT HEART CATH AND CORS/GRAFTS ANGIOGRAPHY

## 2015-06-20 MED ORDER — SODIUM CHLORIDE 0.9% FLUSH: 3.0000 mL | INTRAVENOUS | Status: DC | PRN

## 2015-06-20 MED ORDER — IOPAMIDOL (ISOVUE-370) INJECTION 76%
INTRAVENOUS | Status: AC
  Filled 2015-06-20: qty 125

## 2015-06-20 MED ORDER — NITROGLYCERIN 1 MG/10 ML FOR IR/CATH LAB
INTRA_ARTERIAL | Status: AC
  Filled 2015-06-20: qty 10

## 2015-06-20 MED ORDER — NITROGLYCERIN 1 MG/10 ML FOR IR/CATH LAB
INTRA_ARTERIAL | Status: DC | PRN
  Administered 2015-06-20: 200 ug via INTRA_ARTERIAL

## 2015-06-20 MED ORDER — LIDOCAINE HCL (PF) 1 % IJ SOLN
INTRAMUSCULAR | Status: AC
  Filled 2015-06-20: qty 30

## 2015-06-20 MED ORDER — IOPAMIDOL (ISOVUE-370) INJECTION 76%
INTRAVENOUS | Status: DC | PRN
  Administered 2015-06-20: 65 mL via INTRA_ARTERIAL

## 2015-06-20 MED ORDER — HEPARIN SODIUM (PORCINE) 1000 UNIT/ML IJ SOLN
INTRAMUSCULAR | Status: DC | PRN
  Administered 2015-06-20: 4000 [IU] via INTRAVENOUS

## 2015-06-20 MED ORDER — SODIUM CHLORIDE 0.9 % WEIGHT BASED INFUSION
3.0000 mL/kg/h | INTRAVENOUS | Status: AC
  Administered 2015-06-20: 3 mL/kg/h via INTRAVENOUS

## 2015-06-20 MED ORDER — ONDANSETRON HCL 4 MG/2ML IJ SOLN: 4.0000 mg | Freq: Four times a day (QID) | INTRAMUSCULAR | Status: DC | PRN

## 2015-06-20 MED ORDER — MIDAZOLAM HCL 2 MG/2ML IJ SOLN
INTRAMUSCULAR | Status: DC | PRN
  Administered 2015-06-20 (×2): 1 mg via INTRAVENOUS

## 2015-06-20 MED ORDER — LIDOCAINE HCL (PF) 1 % IJ SOLN
INTRAMUSCULAR | Status: DC | PRN
  Administered 2015-06-20: 3 mL

## 2015-06-20 MED ORDER — ACETAMINOPHEN 325 MG PO TABS
ORAL_TABLET | ORAL | Status: AC
  Administered 2015-06-20: 650 mg via ORAL
  Filled 2015-06-20: qty 2

## 2015-06-20 MED ORDER — HEPARIN (PORCINE) IN NACL 2-0.9 UNIT/ML-% IJ SOLN
INTRAMUSCULAR | Status: AC
  Filled 2015-06-20: qty 1000

## 2015-06-20 MED ORDER — HEPARIN (PORCINE) IN NACL 2-0.9 UNIT/ML-% IJ SOLN
INTRAMUSCULAR | Status: DC | PRN
  Administered 2015-06-20: 1000 mL

## 2015-06-20 MED ORDER — SODIUM CHLORIDE 0.9% FLUSH: 3.0000 mL | Freq: Two times a day (BID) | INTRAVENOUS | Status: DC

## 2015-06-20 MED ORDER — ACETAMINOPHEN 325 MG PO TABS
650.0000 mg | ORAL_TABLET | ORAL | Status: DC | PRN
  Administered 2015-06-20: 650 mg via ORAL

## 2015-06-20 MED ORDER — VERAPAMIL HCL 2.5 MG/ML IV SOLN
INTRAVENOUS | Status: AC
  Filled 2015-06-20: qty 2

## 2015-06-20 MED ORDER — MIDAZOLAM HCL 2 MG/2ML IJ SOLN
INTRAMUSCULAR | Status: AC
  Filled 2015-06-20: qty 2

## 2015-06-20 MED ORDER — HEPARIN SODIUM (PORCINE) 1000 UNIT/ML IJ SOLN
INTRAMUSCULAR | Status: AC
  Filled 2015-06-20: qty 1

## 2015-06-20 MED ORDER — SODIUM CHLORIDE 0.9 % IV SOLN: 250.0000 mL | INTRAVENOUS | Status: DC | PRN

## 2015-06-20 MED ORDER — ASPIRIN 81 MG PO CHEW: 81.0000 mg | CHEWABLE_TABLET | ORAL | Status: DC

## 2015-06-20 MED ORDER — SODIUM CHLORIDE 0.9 % WEIGHT BASED INFUSION: 1.0000 mL/kg/h | INTRAVENOUS | Status: DC

## 2015-06-20 MED ORDER — VERAPAMIL HCL 2.5 MG/ML IV SOLN
INTRAVENOUS | Status: DC | PRN
  Administered 2015-06-20: 10 mL via INTRA_ARTERIAL

## 2015-06-20 MED ORDER — FENTANYL CITRATE (PF) 100 MCG/2ML IJ SOLN
INTRAMUSCULAR | Status: AC
  Filled 2015-06-20: qty 2

## 2015-06-20 MED ORDER — SODIUM CHLORIDE 0.9 % WEIGHT BASED INFUSION: 3.0000 mL/kg/h | INTRAVENOUS | Status: DC

## 2015-06-20 MED ORDER — FENTANYL CITRATE (PF) 100 MCG/2ML IJ SOLN
INTRAMUSCULAR | Status: DC | PRN
  Administered 2015-06-20 (×2): 25 ug via INTRAVENOUS

## 2015-06-20 SURGICAL SUPPLY — 12 items
CATH INFINITI 5 FR JL3.5 (CATHETERS) ×2 IMPLANT
CATH INFINITI 5FR ANG PIGTAIL (CATHETERS) ×2 IMPLANT
CATH INFINITI JR4 5F (CATHETERS) ×2 IMPLANT
DEVICE RAD COMP TR BAND LRG (VASCULAR PRODUCTS) ×2 IMPLANT
GLIDESHEATH SLEND SS 6F .021 (SHEATH) ×2 IMPLANT
KIT HEART LEFT (KITS) ×3 IMPLANT
PACK CARDIAC CATHETERIZATION (CUSTOM PROCEDURE TRAY) ×3 IMPLANT
SYR MEDRAD MARK V 150ML (SYRINGE) ×3 IMPLANT
TRANSDUCER W/STOPCOCK (MISCELLANEOUS) ×3 IMPLANT
TUBING CIL FLEX 10 FLL-RA (TUBING) ×3 IMPLANT
WIRE HI TORQ VERSACORE-J 145CM (WIRE) ×2 IMPLANT
WIRE SAFE-T 1.5MM-J .035X260CM (WIRE) ×2 IMPLANT

## 2015-06-20 NOTE — Interval H&P Note (Signed)
Cath Lab Visit (complete for each Cath Lab visit)  Clinical Evaluation Leading to the Procedure:   ACS: No.  Non-ACS:    Anginal Classification: CCS III  Anti-ischemic medical therapy: Minimal Therapy (1 class of medications)  Non-Invasive Test Results: Intermediate-risk stress test findings: cardiac mortality 1-3%/year  Prior CABG: Previous CABG      History and Physical Interval Note:  06/20/2015 7:50 AM  [name]  has presented today for surgery, with the diagnosis of abnormal myview  The various methods of treatment have been discussed with the patient and family. After consideration of risks, benefits and other options for treatment, the patient has consented to  Procedure(s): Left Heart Cath and Cors/Grafts Angiography (N/A) as a surgical intervention .  The patient's history has been reviewed, patient examined, no change in status, stable for surgery.  I have reviewed the patient's chart and labs.  Questions were answered to the patient's satisfaction.     [name]

## 2015-06-20 NOTE — H&P (View-Only) (Signed)
Patient ID: [name], female   DOB: Apr 06, 1945, 69 y.o.   MRN: [id number] PCP: Dr. Harrington Challenger  70 yo with history of CAD s/p CABG, PAD, and PE in 2/14 presents for cardiology followup.  She had CABG in 2006.  She was found to have PE in 2/14 without known trigger.  She saw her oncologist after that with no sign of recurrent cancer.  She had a negative hypercoagulable workup and was eventually told by her oncologist that she could stop warfarin.    She developed claudication and had peripheral angiography in 11/16.  She had bilateral popliteal occlusions that were not optimal for PCI.  Initially started on cilostazol, but this was stopped when she went on ticagrelor.   She was admitted with unstable angina in 2/17.  SVG-RCA and SVG-D/OM were occluded.  She had DES to 99% pRCA.  She had 50-75% proximal LCx stenosis and had FFR => negative, so no intervention.   She returns for followup.  Very anxious.  Feels a "rumbling" sensation in her chest, like a "heaviness."  No definite trigger.  She has profound fatigue in general.  She has bilateral calf pain and mild dyspnea after walking < 100 feet.  She snores and has daytime sleepiness.   Labs (8/14): K 4.2, creatinine 1.0 Labs (4/15): K 4.6, creatinine 0.8 Labs (4/16): K 3.7, creatinine 1.1 Labs (8/16): LDL 49, HDL 28, LFTs normal Labs (2/17): K 4.3, creatinine 0.88  PMH: 1. Depression 2. Breast cancer: s/p left mastectomy in 3/11.  3. PE A999333: Uncertain etiology, no trigger.  Factor V Leiden negative, prothrombin gene mutation negative, but lupus anticoagulant positive.  Venous dopplers (8/14) with left greater saphenous vein superficial thrombosis.  Repeat antiphospholipid antibody workup negative in 8/14 and again in 1/15.  4. Hyperlipidemia: Myalgias with Crestor and atorvastatin.  5. Type II diabetes 6. HTN 7. Cholescystectomy 8. GERD 9. CAD: s/p CABG in 5/06 with LIMA-LAD, sequential SVG-D and OM, SVG-PDA.  Echo (8/14) with EF 60%, mild LVH,  normal RV.  Cardiolite (7/16) with small fixed lateral defect, no ischemia, EF 51% => low risk.  - Unstable angina (2/17): LHC with totally occluded SVG-dRCA and sequential SVG-D/OM, LIMA-LAD patent, 99% mRCA, 75% PDA, totally occluded LAD, 50-75% mLCx => FFR LCx negative, PCI/DES to mRCA.  10. Carotid stenosis: carotids (8/14) with 40-59% bilateral ICA stenosis. Carotid dopplers (9/15) with 1-39% BICA stenosis.  11. Glaucoma 12. PAD: ABIs (10/16) 0.63 R, 0.59 L.  Peripheral angiography [name]) 11/16 with bilateral popliteal occlusion => lesion not optimal for PCI, so cilostazol started (later stopped with need for DAPT).   SH: Married, quit smoking in 2006, lives in [place].  FH: Mother and father with MIs, sister with CABG in 75.   ROS: All systems reviewed and negative except as per HPI.   Current Outpatient Prescriptions  Medication Sig Dispense Refill  . acetaminophen (TYLENOL) 500 MG tablet Take 1,000 mg by mouth every 6 (six) hours as needed for pain.    [name] ALPRAZolam (XANAX) 0.25 MG tablet Take 0.25 mg by mouth 3 (three) times daily as needed for anxiety. For anxiety    . aspirin EC 81 MG tablet Take 1 tablet (81 mg total) by mouth daily. 90 tablet 0  . Brimonidine Tartrate-Timolol (COMBIGAN OP) Place 1 drop into both eyes 2 (two) times daily.     . carvedilol (COREG) 6.25 MG tablet Take 6.25 mg by mouth 2 (two) times daily with a meal.    . dorzolamide (  TRUSOPT) 2 % ophthalmic solution Place 1 drop into the right eye 2 (two) times daily.    . hydroxypropyl methylcellulose (ISOPTO TEARS) 2.5 % ophthalmic solution Place 1 drop into both eyes as needed for dry eyes.     . metFORMIN (GLUCOPHAGE) 500 MG tablet Take 500 mg by mouth 2 (two) times daily with a meal.    . nitroGLYCERIN (NITROSTAT) 0.4 MG SL tablet Place 0.4 mg under the tongue every 5 (five) minutes as needed for chest pain (x 3 doses).    . sertraline (ZOLOFT) 50 MG tablet Take 100 mg by mouth daily.     . simvastatin  (ZOCOR) 20 MG tablet Take 1 tablet (20 mg total) by mouth daily. 30 tablet 0  . ticagrelor (BRILINTA) 90 MG TABS tablet Take 1 tablet (90 mg total) by mouth 2 (two) times daily. 180 tablet 6  . glimepiride (AMARYL) 2 MG tablet Take 1 tablet (2 mg total) by mouth daily with breakfast.    . lisinopril (PRINIVIL,ZESTRIL) 5 MG tablet Take 1 tablet (5 mg total) by mouth daily. 90 tablet 0   No current facility-administered medications for this visit.   Facility-Administered Medications Ordered in Other Visits  Medication Dose Route Frequency Provider Last Rate Last Dose  . aminophylline injection 75 mg  75 mg Intravenous BID PRN Larey Dresser, MD   75 mg at 08/08/14 1335    BP 140/60 mmHg  Pulse 59  Ht 5\' 7"  (1.702 m)  Wt 161 lb 12.8 oz (73.392 kg)  BMI 25.34 kg/m2  SpO2 96% General: NAD Neck: No JVD, no thyromegaly or thyroid nodule.  Lungs: Clear to auscultation bilaterally with normal respiratory effort. CV: Nondisplaced PMI.  Heart regular S1/S2, no S3/S4, no murmur.  No peripheral edema.  B carotid bruits.  Absent pedal pulses.  Abdomen: Soft, nontender, no hepatosplenomegaly, no distention.  Skin: Intact without lesions or rashes.  Neurologic: Alert and oriented x 3.  Psych: Normal affect. Extremities: No clubbing or cyanosis.   Assessment/Plan: 1. CAD: Stable s/p CABG.  Recent DES to mRCA (2/17).  She had a residual moderate LCx stenosis that was not hemodynamically significant by FFR.  She has been having some chest heaviness, no clear trigger, and profound fatigue.  - I will arrange for Lexiscan Cardiolite.  Will look specifically for ischemia in the LCx territory.  - Continue ASA 81, ticagrelor, statin.  - I would like her to do cardiac rehab if Cardiolite is ok.  2. PE: PE in 2/14.  On initial hypercoagulable workup, lupus anticoagulant was positive, raising concern for antiphospholipid antibody syndrome.  However, repeat antiphospholipid antibody panels in 8/14 and 1/15 were  negative and her oncologist let her stop warfarin.  3. Carotid bruit: Carotid dopplers due in 9/17.  4. Hyperlipidemia: Check lipids today.  She cannot tolerate Crestor or atorvastatin due to myalgias.  She can take Zocor. If LDL is too high, add Zetia.   5. PAD: Known severe PAD.  Stable claudication.  Sees Dr [name], has followup.  She is not on cilostazol given DAPT.  No rest pain or pedal ulcerations.  I recommended that she try to walk through calf pain for at least a short distance.  6. Suspect OSA: I will arrange for sleep study.  7. HTN: Add lisinopril 5 mg daily, BMET in 2 wks.   Followup in 6 wks.    [name] 06/05/2015

## 2015-06-20 NOTE — Discharge Instructions (Signed)
Radial Site Care °Refer to this sheet in the next few weeks. These instructions provide you with information about caring for yourself after your procedure. Your health care provider may also give you more specific instructions. Your treatment has been planned according to current medical practices, but problems sometimes occur. Call your health care provider if you have any problems or questions after your procedure. °WHAT TO EXPECT AFTER THE PROCEDURE °After your procedure, it is typical to have the following: °· Bruising at the radial site that usually fades within 1-2 weeks. °· Blood collecting in the tissue (hematoma) that may be painful to the touch. It should usually decrease in size and tenderness within 1-2 weeks. °HOME CARE INSTRUCTIONS °· Take medicines only as directed by your health care provider. °· You may shower 24-48 hours after the procedure or as directed by your health care provider. Remove the bandage (dressing) and gently wash the site with plain soap and water. Pat the area dry with a clean towel. Do not rub the site, because this may cause bleeding. °· Do not take baths, swim, or use a hot tub until your health care provider approves. °· Check your insertion site every day for redness, swelling, or drainage. °· Do not apply powder or lotion to the site. °· Do not flex or bend the affected arm for 24 hours or as directed by your health care provider. °· Do not push or pull heavy objects with the affected arm for 24 hours or as directed by your health care provider. °· Do not lift over 10 lb (4.5 kg) for 5 days after your procedure or as directed by your health care provider. °· Ask your health care provider when it is okay to: °¨ Return to work or school. °¨ Resume usual physical activities or sports. °¨ Resume sexual activity. °· Do not drive home if you are discharged the same day as the procedure. Have someone else drive you. °· You may drive 24 hours after the procedure unless otherwise  instructed by your health care provider. °· Do not operate machinery or power tools for 24 hours after the procedure. °· If your procedure was done as an outpatient procedure, which means that you went home the same day as your procedure, a responsible adult should be with you for the first 24 hours after you arrive home. °· Keep all follow-up visits as directed by your health care provider. This is important. °SEEK MEDICAL CARE IF: °· You have a fever. °· You have chills. °· You have increased bleeding from the radial site. Hold pressure on the site. °SEEK IMMEDIATE MEDICAL CARE IF: °· You have unusual pain at the radial site. °· You have redness, warmth, or swelling at the radial site. °· You have drainage (other than a small amount of blood on the dressing) from the radial site. °· The radial site is bleeding, and the bleeding does not stop after 30 minutes of holding steady pressure on the site. °· Your arm or hand becomes pale, cool, tingly, or numb. °  °This information is not intended to replace advice given to you by your health care provider. Make sure you discuss any questions you have with your health care provider. °  °Document Released: 01/31/2010 Document Revised: 01/19/2014 Document Reviewed: 07/17/2013 °Elsevier Interactive Patient Education ©2016 [employer] Inc[employer] ° °

## 2015-06-21 ENCOUNTER — Telehealth: Payer: Self-pay | Admitting: Cardiology

## 2015-06-21 NOTE — Telephone Encounter (Signed)
Spoke with patient about recent lab results 

## 2015-06-21 NOTE — Telephone Encounter (Signed)
New Message   Pt returning call. Please call back to discuss

## 2015-06-24 ENCOUNTER — Telehealth: Payer: Self-pay | Admitting: Cardiology

## 2015-06-24 NOTE — Telephone Encounter (Signed)
Pt states Dr Aundra Dubin prescribed Imdur 30mg  daily after cardiac cath 06/20/15. Pt states Imdur/isosorbide is not covered under her prescription plan.  Pt states  pharmacy told her that Imdur 30mg  daily requires prior authorization. Pt states [employer] Member ID [id number], number to call [phone].   Pt advised I will forward to [place] R to check on this for her.

## 2015-06-24 NOTE — Telephone Encounter (Signed)
New Message  Pt requestd to speak w/ RN concerning her Imdur. Please call back and discuss.

## 2015-06-25 ENCOUNTER — Telehealth: Payer: Self-pay | Admitting: Cardiology

## 2015-06-25 ENCOUNTER — Ambulatory Visit (INDEPENDENT_AMBULATORY_CARE_PROVIDER_SITE_OTHER): Payer: Medicare Other | Admitting: Cardiovascular Disease

## 2015-06-25 ENCOUNTER — Telehealth: Payer: Self-pay | Admitting: Cardiovascular Disease

## 2015-06-25 ENCOUNTER — Encounter: Payer: Self-pay | Admitting: Cardiovascular Disease

## 2015-06-25 ENCOUNTER — Telehealth: Payer: Self-pay

## 2015-06-25 VITALS — BP 125/64 | HR 56 | Ht 67.5 in | Wt 162.6 lb

## 2015-06-25 DIAGNOSIS — I739 Peripheral vascular disease, unspecified: Secondary | ICD-10-CM | POA: Diagnosis not present

## 2015-06-25 DIAGNOSIS — I6523 Occlusion and stenosis of bilateral carotid arteries: Secondary | ICD-10-CM

## 2015-06-25 NOTE — Telephone Encounter (Signed)
Follow-up ° ° ° °The pt is returning the nurses phone call °

## 2015-06-25 NOTE — Telephone Encounter (Signed)
Left message for patient to call me back. 

## 2015-06-25 NOTE — Telephone Encounter (Signed)
This was given during [place] on 08/08/14 and 06/12/15 but still appears on after visit summary. I spoke with pt and gave her this information.  I told her she would not be charged for this as part of today's office visit.

## 2015-06-25 NOTE — Progress Notes (Signed)
Cardiology Office Note   Date:  06/25/2015   ID:  Taylor [name], DOB Jul 23, 1945, MRN [id number]  PCP:   Melinda Crutch, MD  Cardiologist:  Dr. Aundra Dubin  Chief Complaint  Patient presents with  . Hospitalization Follow-up    pt have cath done, no stents      History of Present Illness: Taylor [name] is a 70 y.o. female who presents for a follow-up visit regarding peripheral arterial disease. She has known history of CAD s/p CABG in 2006, pulmonary embolism in 2014, breast cancer status post left mastectomy in 2011, hyperlipidemia, type 2 diabetes and hypertension. She is not a smoker. She is followed by me for bilateral calf claudication. Lower extremity angiography in November 2016 showed bilateral popliteal artery occlusion. She was treated medically with cilostazol. No revascularization was performed. She presented with unstable angina in February 2017. She was found to have critical mid RCA stenosis. This was treated successfully with angioplasty and drug-eluting stent placement. There was moderate left circumflex disease not significant by FFR. Due to requirement of dual antiplatelet therapy, cilostazol was discontinued. She reports no worsening claudication. Her calf claudication continues to be mild and not lifestyle limiting. She seems to be mostly limited by increased anginal symptoms. She underwent repeat cardiac catheterization which showed patent RCA stent but there was new disease proximal to the previously placed stent estimated to be around 60%. She was placed on Imdur but has not started the medication yet.  Past Medical History  Diagnosis Date  . Coronary artery disease     a. 2006 s/p CABG x 4 (LIMA->LAD, VG->Diag->OM, VG->RPDA;  b. 07/2014 MV: EF 51%, medium defect of mild severity, likely artifact, low risk.   c. 02/2015: [place] s/p DES to [facility]  . Hypercholesteremia   . Hypertensive heart disease   . Reflux esophagitis   . Depression   . Pulmonary embolism ([place]) a. 2014.   [name] Hx of tobacco use, presenting hazards to health     a. quit 2006.  [name] PAD (peripheral artery disease) ([place])     a. 11/2014 ABI: R: 0.63, L 0.59;  b. 11/2014 Periph Angio: bilat pop occlusions. L - short w/ reconstituion via collats in dist pop w/ 2 vessel runoff, R long w/ reconstitution in prox tib/peroneal arteries-->med Rx w/ pletal.  . Left carotid bruit     a. 09/2014 Carotid U/S: 1-39% bilat ICA stenosis.  . Complication of anesthesia   . Hypertension   . Type II diabetes mellitus ([place])   . Cancer of left breast [facility])     Past Surgical History  Procedure Laterality Date  . Bunionectomy Left 1970s  . Cataract extraction w/ intraocular lens  implant, bilateral Bilateral 1980s-1990s  . Retinal detachment surgery Right 1990s  . Eye surgery    . Peripheral vascular catheterization N/A 12/12/2014    Procedure: Abdominal Aortogram;  Surgeon: Wellington Hampshire, MD;  Location: [facility];  Service: Cardiovascular;  Laterality: N/A;  . Cardiac catheterization  04/2004  . Coronary angioplasty with stent placement  02/21/2015    "1 stent"  . Coronary artery bypass graft  05/2004    "CABG X4"  . Laparoscopic cholecystectomy  1990s  . Breast biopsy Left ~ 2012  . Mastectomy Left ~ 2012  . Cardiac catheterization N/A 02/21/2015    Procedure: Left Heart Cath and Cors/Grafts Angiography;  Surgeon: Belva Crome, MD;  Location: [facility];  Service: Cardiovascular;  Laterality: N/A;  .  Cardiac catheterization N/A 02/21/2015    Procedure: Coronary Stent Intervention;  Surgeon: Belva Crome, MD;  Location: [facility];  Service: Cardiovascular;  Laterality: N/A;  . Cardiac catheterization N/A 02/21/2015    Procedure: Intravascular Pressure Wire/FFR Study;  Surgeon: Belva Crome, MD;  Location: [facility];  Service: Cardiovascular;  Laterality: N/A;  . Cardiac catheterization N/A 06/20/2015    Procedure: Left Heart Cath and Cors/Grafts Angiography;  Surgeon: Larey Dresser,  MD;  Location: [facility];  Service: Cardiovascular;  Laterality: N/A;     Current Outpatient Prescriptions  Medication Sig Dispense Refill  . acetaminophen (TYLENOL) 500 MG tablet Take 1,000 mg by mouth every 6 (six) hours as needed for pain.    [name] ALPRAZolam (XANAX) 0.25 MG tablet Take 0.25 mg by mouth 3 (three) times daily as needed for anxiety. For anxiety    . aspirin EC 81 MG tablet Take 1 tablet (81 mg total) by mouth daily. 90 tablet 0  . Brimonidine Tartrate-Timolol (COMBIGAN OP) Place 1 drop into both eyes 2 (two) times daily.     . carvedilol (COREG) 6.25 MG tablet Take 6.25 mg by mouth 2 (two) times daily with a meal.    . dorzolamide (TRUSOPT) 2 % ophthalmic solution Place 1 drop into both eyes 2 (two) times daily.     [name] glimepiride (AMARYL) 2 MG tablet Take 1 tablet (2 mg total) by mouth daily with breakfast.    . hydroxypropyl methylcellulose (ISOPTO TEARS) 2.5 % ophthalmic solution Place 1 drop into both eyes as needed for dry eyes.     . isosorbide mononitrate (IMDUR) 30 MG 24 hr tablet Take 30 mg by mouth daily.    [name] lisinopril (PRINIVIL,ZESTRIL) 5 MG tablet Take 1 tablet (5 mg total) by mouth daily. 90 tablet 0  . metFORMIN (GLUCOPHAGE) 500 MG tablet Take 1,000 mg by mouth 2 (two) times daily with a meal.     . nitroGLYCERIN (NITROSTAT) 0.4 MG SL tablet Place 0.4 mg under the tongue every 5 (five) minutes as needed for chest pain (x 3 doses).    . sertraline (ZOLOFT) 100 MG tablet Take 100 mg by mouth daily.    . simvastatin (ZOCOR) 20 MG tablet Take 1 tablet (20 mg total) by mouth daily. 30 tablet 0  . ticagrelor (BRILINTA) 90 MG TABS tablet Take 1 tablet (90 mg total) by mouth 2 (two) times daily. 180 tablet 6   No current facility-administered medications for this visit.   Facility-Administered Medications Ordered in Other Visits  Medication Dose Route Frequency Provider Last Rate Last Dose  . aminophylline injection 75 mg  75 mg Intravenous BID PRN Larey Dresser, MD   75 mg at 08/08/14 1335    Allergies:   Lipitor and Niacin    Social History:  The patient  reports that she quit smoking about 11 years ago. Her smoking use included Cigarettes. She has a 7.5 pack-year smoking history. She has never used smokeless tobacco. She reports that she does not drink alcohol or use illicit drugs.   Family History:  The patient's family history includes Cancer in her sister; Coronary artery disease (age of onset: 53) in her sister; Coronary artery disease (age of onset: 70) in her mother; Deep vein thrombosis in her cousin; Heart attack (age of onset: 57) in her father.    ROS:  Please see the history of present illness.   Otherwise, review of systems are positive for  none.   All other systems are reviewed and negative.    PHYSICAL EXAM: VS:  BP 125/64 mmHg  Pulse 56  Ht 5' 7.5" (1.715 m)  Wt 162 lb 9.6 oz (73.755 kg)  BMI 25.08 kg/m2 , BMI Body mass index is 25.08 kg/(m^2). GEN: Well nourished, well developed, in no acute distress HEENT: normal Neck: no JVD, or masses. Positive carotid bruits Cardiac: RRR; no murmurs, rubs, or gallops,no edema  Respiratory:  clear to auscultation bilaterally, normal work of breathing GI: soft, nontender, nondistended, + BS MS: no deformity or atrophy Skin: warm and dry, no rash Neuro:  Strength and sensation are intact Psych: euthymic mood, full affect   EKG:  EKG is not ordered today.    Recent Labs: 12/20/2014: ALT <9 06/18/2015: BUN 16; Creat 1.05*; Hemoglobin 13.4; Platelets 341; Potassium 4.8; Sodium 137    Lipid Panel    Component Value Date/Time   CHOL 136 06/18/2015 0946   TRIG 100 06/18/2015 0946   HDL 35* 06/18/2015 0946   CHOLHDL 3.9 06/18/2015 0946   VLDL 20 06/18/2015 0946   LDLCALC 81 06/18/2015 0946      Wt Readings from Last 3 Encounters:  06/25/15 162 lb 9.6 oz (73.755 kg)  06/20/15 160 lb (72.576 kg)  06/04/15 161 lb 12.8 oz (73.392 kg)       ASSESSMENT AND PLAN:  1.   Peripheral arterial disease: She has known bilateral popliteal artery occlusion. Her claudication was initially moderate to severe but has improved gradually. I decided to take her off cilostazol given that she is on dual antiplatelet therapy. I recommend continuing medical therapy. I encouraged her to do more walking.  2. Coronary artery disease status post CABG with other forms of angina: I reviewed her angiogram. She hasn't started treatment with Imdur. If her angina persists, FFR of the right coronary artery can be considered given the presence of a new stenosis proximal to the previously placed stent.  3. Mild bilateral carotid disease:  she is scheduled for a follow-up carotid Doppler this year.  4. Hyperlipidemia: Most recent LDL was 81. She is currently on simvastatin.    Disposition:   FU with me in 6 months  Signed,  Kathlyn Sacramento, MD  06/25/2015 10:21 AM    [place]

## 2015-06-25 NOTE — Patient Instructions (Signed)

## 2015-06-25 NOTE — Telephone Encounter (Signed)
Patient has already picked up generic Imdur. Apparently no PA was needed.

## 2015-06-25 NOTE — Telephone Encounter (Signed)
Patient has already picked up her Imdur 30 mg from [facility]. Added it to her med list, as it was not previously on it.

## 2015-06-25 NOTE — Telephone Encounter (Signed)
No PA needed for Imdur. Patient has already picked it up.

## 2015-06-25 NOTE — Telephone Encounter (Signed)
New Message:   Pt called in wanting to speak with a nurse about something listed on her AVS. She says there is an injection(aminophylline) listed on there and she did not have it done. Please f/u with her.

## 2015-07-09 ENCOUNTER — Ambulatory Visit (INDEPENDENT_AMBULATORY_CARE_PROVIDER_SITE_OTHER): Payer: Medicare Other | Admitting: Cardiology

## 2015-07-09 ENCOUNTER — Encounter: Payer: Self-pay | Admitting: Cardiology

## 2015-07-09 VITALS — BP 112/42 | HR 60 | Ht 67.5 in | Wt 162.8 lb

## 2015-07-09 DIAGNOSIS — I25708 Atherosclerosis of coronary artery bypass graft(s), unspecified, with other forms of angina pectoris: Secondary | ICD-10-CM

## 2015-07-09 DIAGNOSIS — I6523 Occlusion and stenosis of bilateral carotid arteries: Secondary | ICD-10-CM

## 2015-07-09 DIAGNOSIS — I2510 Atherosclerotic heart disease of native coronary artery without angina pectoris: Secondary | ICD-10-CM | POA: Diagnosis not present

## 2015-07-09 DIAGNOSIS — I10 Essential (primary) hypertension: Secondary | ICD-10-CM

## 2015-07-09 MED ORDER — SIMVASTATIN 40 MG PO TABS: 40.0000 mg | ORAL_TABLET | Freq: Every day | ORAL | Status: DC

## 2015-07-09 NOTE — Patient Instructions (Addendum)
Medication Instructions:  Your physician has recommended you make the following change in your medication:  1) Increase Zocor to 40 mg daily   Labwork: Your physician recommends that you return for lab work in: 2 months:fasting lipid/liver   Testing/Procedures: Your physician has requested that you have a carotid duplex. This test is an ultrasound of the carotid arteries in your neck. It looks at blood flow through these arteries that supply the brain with blood. Allow one hour for this exam. There are no restrictions or special instructions.    Follow-Up: Your physician recommends that you schedule a follow-up appointment in: 4 months with Dr Aundra Dubin    Any Other Special Instructions Will Be Listed Below (If Applicable).     If you need a refill on your cardiac medications before your next appointment, please call your pharmacy.

## 2015-07-10 NOTE — Progress Notes (Signed)
Patient ID: Taylor [name], female   DOB: May 25, 1945, 70 y.o.   MRN: [id number] PCP: Dr. Harrington Challenger  70 yo with history of CAD s/p CABG, PAD, and PE in 2/14 presents for cardiology followup.  She had CABG in 2006.  She was found to have PE in 2/14 without known trigger.  She saw her oncologist after that with no sign of recurrent cancer.  She had a negative hypercoagulable workup and was eventually told by her oncologist that she could stop warfarin.    She developed claudication and had peripheral angiography in 11/16.  She had bilateral popliteal occlusions that were not optimal for PCI.  Initially started on cilostazol, but this was stopped when she went on ticagrelor.   She was admitted with unstable angina in 2/17.  SVG-RCA and SVG-D/OM were occluded.  She had DES to 99% pRCA.  She had 50-75% proximal LCx stenosis and had FFR => negative, so no intervention.   At last appointment, she reported prominent fatigue and ongoing episodes of chest discomfort both with and without exertion.  Cardiolite was intermediate risk, so I repeated LHC in 6/17.  On this study, the RCA stent was patent with a new 50% stenosis proximal to the stent.  The LCx stenosis was 50-60% and appeared less significant than on the prior cath.  She was managed medically.  She is now taking Imdur.  No further chest pain episodes.  She is short of breath walking fast or with heavy housework like vacuuming.  She has bilateral calf pain and mild dyspnea after walking < 100 feet.  She saw Dr [name] and will have medical management for now. She snores and has daytime sleepiness, sleep study pending.   Labs (8/14): K 4.2, creatinine 1.0 Labs (4/15): K 4.6, creatinine 0.8 Labs (4/16): K 3.7, creatinine 1.1 Labs (8/16): LDL 49, HDL 28, LFTs normal Labs (2/17): K 4.3, creatinine 0.88 Labs (6/17): LDL 81, HDL 35, K 4.8, creatinine 1.05  PMH: 1. Depression 2. Breast cancer: s/p left mastectomy in 3/11.  3. PE [id number]: Uncertain etiology, no  trigger.  Factor V Leiden negative, prothrombin gene mutation negative, but lupus anticoagulant positive.  Venous dopplers (8/14) with left greater saphenous vein superficial thrombosis.  Repeat antiphospholipid antibody workup negative in 8/14 and again in 1/15.  4. Hyperlipidemia: Myalgias with Crestor and atorvastatin.  5. Type II diabetes 6. HTN 7. Cholescystectomy 8. GERD 9. CAD: s/p CABG in 5/06 with LIMA-LAD, sequential SVG-D and OM, SVG-PDA.  Echo (8/14) with EF 60%, mild LVH, normal RV.  Cardiolite (7/16) with small fixed lateral defect, no ischemia, EF 51% => low risk.  - Unstable angina (2/17): LHC with totally occluded SVG-dRCA and sequential SVG-D/OM, LIMA-LAD patent, 99% mRCA, 75% PDA, totally occluded LAD, 50-75% mLCx => FFR LCx negative, PCI/DES to mRCA.  - LHC (6/17) with totally occluded ostial LAD, free LIMA-LAD patent, 50-60% mLCX (appeared better than prior), 90% ostial/proximal PLOM (small vessel, not amenable to PCI), 50% mRCA, 50% mPLV, SVG-RCA totally occluded.  Medical management.  10. Carotid stenosis: carotids (8/14) with 40-59% bilateral ICA stenosis. Carotid dopplers (9/15) with 1-39% BICA stenosis.  11. Glaucoma 12. PAD: ABIs (10/16) 0.63 R, 0.59 L.  Peripheral angiography [name]) 11/16 with bilateral popliteal occlusion => lesion not optimal for PCI, so cilostazol started (later stopped with need for DAPT).   SH: Married, quit smoking in 2006, lives in [place].  FH: Mother and father with MIs, sister with CABG in 15.   ROS: All systems  reviewed and negative except as per HPI.   Current Outpatient Prescriptions  Medication Sig Dispense Refill  . acetaminophen (TYLENOL) 500 MG tablet Take 1,000 mg by mouth every 6 (six) hours as needed for pain.    [name] ALPRAZolam (XANAX) 0.25 MG tablet Take 0.25 mg by mouth 3 (three) times daily as needed for anxiety. For anxiety    . aspirin EC 81 MG tablet Take 1 tablet (81 mg total) by mouth daily. 90 tablet 0  . Brimonidine  Tartrate-Timolol (COMBIGAN OP) Place 1 drop into both eyes 2 (two) times daily.     . carvedilol (COREG) 6.25 MG tablet Take 6.25 mg by mouth 2 (two) times daily with a meal.    . dorzolamide (TRUSOPT) 2 % ophthalmic solution Place 1 drop into both eyes 2 (two) times daily.     [name] glimepiride (AMARYL) 2 MG tablet Take 1 tablet (2 mg total) by mouth daily with breakfast.    . hydroxypropyl methylcellulose (ISOPTO TEARS) 2.5 % ophthalmic solution Place 1 drop into both eyes daily as needed for dry eyes.     . isosorbide mononitrate (IMDUR) 30 MG 24 hr tablet Take 30 mg by mouth daily.    [name] lisinopril (PRINIVIL,ZESTRIL) 5 MG tablet Take 1 tablet (5 mg total) by mouth daily. 90 tablet 0  . metFORMIN (GLUCOPHAGE) 500 MG tablet Take 1,000 mg by mouth 2 (two) times daily with a meal.     . nitroGLYCERIN (NITROSTAT) 0.4 MG SL tablet Place 0.4 mg under the tongue every 5 (five) minutes as needed for chest pain (MAX 3 doses in 15 minutes).     . sertraline (ZOLOFT) 100 MG tablet Take 100 mg by mouth daily.    . simvastatin (ZOCOR) 40 MG tablet Take 1 tablet (40 mg total) by mouth daily. 90 tablet 3  . ticagrelor (BRILINTA) 90 MG TABS tablet Take 1 tablet (90 mg total) by mouth 2 (two) times daily. 180 tablet 6   No current facility-administered medications for this visit.   Facility-Administered Medications Ordered in Other Visits  Medication Dose Route Frequency Provider Last Rate Last Dose  . aminophylline injection 75 mg  75 mg Intravenous BID PRN Larey Dresser, MD   75 mg at 08/08/14 1335    BP 112/42 mmHg  Pulse 60  Ht 5' 7.5" (1.715 m)  Wt 162 lb 12.8 oz (73.846 kg)  BMI 25.11 kg/m2  SpO2 97% General: NAD Neck: No JVD, no thyromegaly or thyroid nodule.  Lungs: Clear to auscultation bilaterally with normal respiratory effort. CV: Nondisplaced PMI.  Heart regular S1/S2, no S3/S4, no murmur.  No peripheral edema.  B carotid bruits.  Absent pedal pulses.  Abdomen: Soft, nontender, no  hepatosplenomegaly, no distention.  Skin: Intact without lesions or rashes.  Neurologic: Alert and oriented x 3.  Psych: Normal affect. Extremities: No clubbing or cyanosis.   Assessment/Plan: 1. CAD: Stable s/p CABG.  Recent DES to mRCA (2/17).  She had a residual moderate LCx stenosis that was not hemodynamically significant by FFR.  She re-developed chest discomfort and had intermediate risk Cardiolite in 6/17.  Repeat cardiac cath showed patent RCA stent with 50% stenosis prior to stent (this did not appear significant).  The LCx stenosis appeared less severe than prior, 50-60% stenosis.  She was managed medically.  Now on Imdur, no further chest pain. - Continue Imdur 30 mg daily.  - Continue ASA 81, ticagrelor, statin.  - Start cardiac rehab.  2. PE: PE in 2/14.  On initial hypercoagulable workup, lupus anticoagulant was positive, raising concern for antiphospholipid antibody syndrome.  However, repeat antiphospholipid antibody panels in 8/14 and 1/15 were negative and her oncologist let her stop warfarin.  3. Carotid bruit: Carotid dopplers due in 9/17.  4. Hyperlipidemia: LDL goal < 70.  Increase simvastatin to 40 mg daily with lipids/LFTs in 2 months.    5. PAD: Known severe PAD.  Stable claudication.  Follows with Dr [name].  She is not on cilostazol given DAPT.  No rest pain or pedal ulcerations.  I recommended that she try to walk through calf pain for at least a short distance.  6. Suspect OSA: Sleep study has been scheduled.  7. HTN: BP better on lisinopril.   Followup in 4 months.    Loralie Champagne 07/10/2015

## 2015-07-24 ENCOUNTER — Other Ambulatory Visit: Payer: Self-pay

## 2015-07-25 ENCOUNTER — Other Ambulatory Visit: Payer: Self-pay | Admitting: *Deleted

## 2015-07-25 ENCOUNTER — Telehealth: Payer: Self-pay | Admitting: Cardiology

## 2015-07-25 MED ORDER — SIMVASTATIN 40 MG PO TABS: 40.0000 mg | ORAL_TABLET | Freq: Every day | ORAL | Status: DC

## 2015-07-25 NOTE — Telephone Encounter (Signed)
New message    *STAT* If patient is at the pharmacy, call can be transferred to refill team.   1. Which medications need to be refilled? (please list name of each medication and dose if known) Simobastin  40mg  2. Which pharmacy/location (including street and city if local pharmacy) is medication to be sent to? [facility] on [address]  3. Do they need a 30 day or 90 day supply? [place]

## 2015-08-07 ENCOUNTER — Ambulatory Visit (HOSPITAL_BASED_OUTPATIENT_CLINIC_OR_DEPARTMENT_OTHER): Payer: Medicare Other | Attending: Cardiology | Admitting: Cardiology

## 2015-08-07 VITALS — Ht 67.0 in | Wt 162.0 lb

## 2015-08-07 DIAGNOSIS — R51 Headache: Secondary | ICD-10-CM | POA: Insufficient documentation

## 2015-08-07 DIAGNOSIS — E7800 Pure hypercholesterolemia, unspecified: Secondary | ICD-10-CM | POA: Diagnosis not present

## 2015-08-07 DIAGNOSIS — I10 Essential (primary) hypertension: Secondary | ICD-10-CM | POA: Insufficient documentation

## 2015-08-07 DIAGNOSIS — R079 Chest pain, unspecified: Secondary | ICD-10-CM | POA: Diagnosis not present

## 2015-08-07 DIAGNOSIS — E119 Type 2 diabetes mellitus without complications: Secondary | ICD-10-CM | POA: Diagnosis not present

## 2015-08-07 DIAGNOSIS — G4733 Obstructive sleep apnea (adult) (pediatric): Secondary | ICD-10-CM | POA: Diagnosis not present

## 2015-08-07 DIAGNOSIS — I2510 Atherosclerotic heart disease of native coronary artery without angina pectoris: Secondary | ICD-10-CM | POA: Insufficient documentation

## 2015-08-07 DIAGNOSIS — R5383 Other fatigue: Secondary | ICD-10-CM | POA: Diagnosis not present

## 2015-08-07 DIAGNOSIS — G4719 Other hypersomnia: Secondary | ICD-10-CM

## 2015-08-07 DIAGNOSIS — R0683 Snoring: Secondary | ICD-10-CM | POA: Diagnosis not present

## 2015-08-07 DIAGNOSIS — I493 Ventricular premature depolarization: Secondary | ICD-10-CM | POA: Diagnosis not present

## 2015-08-07 DIAGNOSIS — I2583 Coronary atherosclerosis due to lipid rich plaque: Secondary | ICD-10-CM

## 2015-08-07 DIAGNOSIS — I6523 Occlusion and stenosis of bilateral carotid arteries: Secondary | ICD-10-CM | POA: Diagnosis not present

## 2015-08-09 DIAGNOSIS — Z7984 Long term (current) use of oral hypoglycemic drugs: Secondary | ICD-10-CM | POA: Diagnosis not present

## 2015-08-09 DIAGNOSIS — F419 Anxiety disorder, unspecified: Secondary | ICD-10-CM | POA: Diagnosis not present

## 2015-08-09 DIAGNOSIS — I10 Essential (primary) hypertension: Secondary | ICD-10-CM | POA: Diagnosis not present

## 2015-08-09 DIAGNOSIS — E1165 Type 2 diabetes mellitus with hyperglycemia: Secondary | ICD-10-CM | POA: Diagnosis not present

## 2015-08-09 DIAGNOSIS — I739 Peripheral vascular disease, unspecified: Secondary | ICD-10-CM | POA: Diagnosis not present

## 2015-08-09 DIAGNOSIS — Z0000 Encounter for general adult medical examination without abnormal findings: Secondary | ICD-10-CM | POA: Diagnosis not present

## 2015-08-18 NOTE — Procedures (Signed)
   Patient Name: Taylor, [name] Date: 08/07/2015 Gender: Female D.O.B: 10-26-1945 Age (years): 70 Referring Provider: Loralie Champagne Height (inches): 67 Interpreting Physician: Fransico Him MD, ABSM Weight (lbs): 162 RPSGT: Carolin Coy BMI: 25 MRN: [id number] Neck Size: 15.00  CLINICAL INFORMATION Sleep Study Type: NPSG Indication for sleep study: Diabetes, Fatigue, Hypertension, Morning Headaches, Snoring Epworth Sleepiness Score: 10  SLEEP STUDY TECHNIQUE As per the AASM Manual for the Scoring of Sleep and Associated Events v2.3 (April 2016) with a hypopnea requiring 4% desaturations. The channels recorded and monitored were frontal, central and occipital EEG, electrooculogram (EOG), submentalis EMG (chin), nasal and oral airflow, thoracic and abdominal wall motion, anterior tibialis EMG, snore microphone, electrocardiogram, and pulse oximetry.  MEDICATIONS Patient's medications include: Reviewed in the chart Medications self-administered by patient during sleep study : No sleep medicine administered.  SLEEP ARCHITECTURE The study was initiated at 10:07:46 PM and ended at 4:52:24 AM. Sleep onset time was 20.7 minutes and the sleep efficiency was 83.8%. The total sleep time was 339.0 minutes. Stage REM latency was reduced at 256.0 minutes. The patient spent 16.81% of the night in stage N1 sleep, 76.25% in stage N2 sleep, 0.00% in stage N3 and 6.93% in REM. Alpha intrusion was absent. Supine sleep was 17.85%.  RESPIRATORY PARAMETERS The overall apnea/hypopnea index (AHI) was 16.3 per hour. There were 21 total apneas, including 17 obstructive, 0 central and 4 mixed apneas. There were 71 hypopneas and 51 RERAs. The AHI during Stage REM sleep was 30.6 per hour. AHI while supine was 58.5 per hour. The mean oxygen saturation was 94.17%. The minimum SpO2 during sleep was 83.00%. Moderate snoring was noted during this study.  CARDIAC DATA The 2 lead EKG demonstrated  sinus rhythm. The mean heart rate was 46.91 beats per minute. Other EKG findings include: PVCs.  LEG MOVEMENT DATA The total PLMS were 0 with a resulting PLMS index of 0.00. Associated arousal with leg movement index was 0.0 .  IMPRESSIONS - Moderate obstructive sleep apnea occurred during this study (AHI = 16.3/h). - No significant central sleep apnea occurred during this study (CAI = 0.0/h). - Mocerate oxygen desaturation was noted during this study (Min O2 = 83.00%). - The patient snored with Moderate snoring volume. - EKG findings include PVCs. - Clinically significant periodic limb movements did not occur during sleep. No significant associated arousals.  DIAGNOSIS - Obstructive Sleep Apnea (327.23 [G47.33 ICD-10])  RECOMMENDATIONS - Therapeutic CPAP titration to determine optimal pressure required to alleviate sleep disordered breathing. - Positional therapy avoiding supine position during sleep. - Avoid alcohol, sedatives and other CNS depressants that may worsen sleep apnea and disrupt normal sleep architecture. - Sleep hygiene should be reviewed to assess factors that may improve sleep quality. - Weight management and regular exercise should be initiated or continued if appropriate.   [place], American Board of Sleep Medicine  ELECTRONICALLY SIGNED ON:  08/18/2015, 6:28 PM [place] PH: [phone]   FX: [phone] [place]

## 2015-08-20 ENCOUNTER — Other Ambulatory Visit: Payer: Self-pay

## 2015-08-20 DIAGNOSIS — G4733 Obstructive sleep apnea (adult) (pediatric): Secondary | ICD-10-CM

## 2015-08-20 NOTE — Progress Notes (Signed)
Spoke to patient and gave sleep study results and Dr.Turner's recommendations. All of patients questions were answered to her satisfaction. Patient verbalized understanding of the information given to her. Will schedule appt for titration in lab and mail letter.

## 2015-08-20 NOTE — Progress Notes (Signed)
Left message to call back  

## 2015-09-05 ENCOUNTER — Other Ambulatory Visit: Payer: Self-pay | Admitting: Cardiology

## 2015-09-05 DIAGNOSIS — I2510 Atherosclerotic heart disease of native coronary artery without angina pectoris: Secondary | ICD-10-CM

## 2015-09-05 DIAGNOSIS — E7800 Pure hypercholesterolemia, unspecified: Secondary | ICD-10-CM

## 2015-09-05 DIAGNOSIS — I6523 Occlusion and stenosis of bilateral carotid arteries: Secondary | ICD-10-CM

## 2015-09-05 DIAGNOSIS — I2583 Coronary atherosclerosis due to lipid rich plaque: Secondary | ICD-10-CM

## 2015-09-05 DIAGNOSIS — I10 Essential (primary) hypertension: Secondary | ICD-10-CM

## 2015-09-05 DIAGNOSIS — R079 Chest pain, unspecified: Secondary | ICD-10-CM

## 2015-09-06 ENCOUNTER — Other Ambulatory Visit: Payer: Medicare Other | Admitting: *Deleted

## 2015-09-06 DIAGNOSIS — I10 Essential (primary) hypertension: Secondary | ICD-10-CM

## 2015-09-06 DIAGNOSIS — I2510 Atherosclerotic heart disease of native coronary artery without angina pectoris: Secondary | ICD-10-CM | POA: Diagnosis not present

## 2015-09-06 LAB — HEPATIC FUNCTION PANEL
ALK PHOS: 54 U/L (ref 33–130)
ALT: 10 U/L (ref 6–29)
AST: 13 U/L (ref 10–35)
Albumin: 4.5 g/dL (ref 3.6–5.1)
BILIRUBIN DIRECT: 0.1 mg/dL (ref <=0.2)
BILIRUBIN INDIRECT: 0.3 mg/dL (ref 0.2–1.2)
BILIRUBIN TOTAL: 0.4 mg/dL (ref 0.2–1.2)
Total Protein: 7.1 g/dL (ref 6.1–8.1)

## 2015-09-06 LAB — LIPID PANEL
Cholesterol: 112 mg/dL — ABNORMAL LOW (ref 125–200)
HDL: 31 mg/dL — AB (ref >=46)
LDL CALC: 61 mg/dL (ref <130)
Total CHOL/HDL Ratio: 3.6 Ratio (ref <=5.0)
Triglycerides: 99 mg/dL (ref <150)
VLDL: 20 mg/dL (ref <30)

## 2015-09-10 ENCOUNTER — Telehealth: Payer: Self-pay | Admitting: Cardiology

## 2015-09-10 NOTE — Telephone Encounter (Signed)
F/u Message ° °Pt returning RN call. Please call back to discuss  °

## 2015-09-10 NOTE — Telephone Encounter (Signed)
Spoke with patient about recent lab results 

## 2015-09-10 NOTE — Telephone Encounter (Signed)
LMTCB for pt 

## 2015-09-10 NOTE — Telephone Encounter (Signed)
New message ° ° ° ° °Pt returning nurse call. Please call.  °

## 2015-09-10 NOTE — Telephone Encounter (Signed)
Follow up ° ° °Pt returning rn call °

## 2015-09-11 ENCOUNTER — Ambulatory Visit
Admission: RE | Admit: 2015-09-11 | Discharge: 2015-09-11 | Disposition: A | Discharge status: Home / Self Care | Payer: Medicare Other | Source: Ambulatory Visit | Attending: Oncology | Admitting: Oncology

## 2015-09-11 ENCOUNTER — Other Ambulatory Visit: Payer: Self-pay | Admitting: Cardiology

## 2015-09-11 DIAGNOSIS — Z1231 Encounter for screening mammogram for malignant neoplasm of breast: Secondary | ICD-10-CM | POA: Diagnosis not present

## 2015-09-11 DIAGNOSIS — I6523 Occlusion and stenosis of bilateral carotid arteries: Secondary | ICD-10-CM

## 2015-09-11 DIAGNOSIS — I10 Essential (primary) hypertension: Secondary | ICD-10-CM

## 2015-09-17 ENCOUNTER — Ambulatory Visit (HOSPITAL_COMMUNITY)
Admission: RE | Admit: 2015-09-17 | Discharge: 2015-09-17 | Disposition: A | Discharge status: Home / Self Care | Payer: Medicare Other | Source: Ambulatory Visit | Attending: Internal Medicine | Admitting: Internal Medicine

## 2015-09-17 ENCOUNTER — Encounter (HOSPITAL_COMMUNITY): Payer: Medicare Other

## 2015-09-17 DIAGNOSIS — E1151 Type 2 diabetes mellitus with diabetic peripheral angiopathy without gangrene: Secondary | ICD-10-CM | POA: Insufficient documentation

## 2015-09-17 DIAGNOSIS — R0989 Other specified symptoms and signs involving the circulatory and respiratory systems: Secondary | ICD-10-CM | POA: Diagnosis not present

## 2015-09-17 DIAGNOSIS — E785 Hyperlipidemia, unspecified: Secondary | ICD-10-CM | POA: Insufficient documentation

## 2015-09-17 DIAGNOSIS — I6523 Occlusion and stenosis of bilateral carotid arteries: Secondary | ICD-10-CM | POA: Diagnosis not present

## 2015-09-17 DIAGNOSIS — Z87891 Personal history of nicotine dependence: Secondary | ICD-10-CM | POA: Insufficient documentation

## 2015-09-17 DIAGNOSIS — I2510 Atherosclerotic heart disease of native coronary artery without angina pectoris: Secondary | ICD-10-CM | POA: Insufficient documentation

## 2015-09-17 DIAGNOSIS — Z951 Presence of aortocoronary bypass graft: Secondary | ICD-10-CM | POA: Insufficient documentation

## 2015-09-17 DIAGNOSIS — I10 Essential (primary) hypertension: Secondary | ICD-10-CM | POA: Insufficient documentation

## 2015-09-17 IMAGING — CR DG HIP (WITH OR WITHOUT PELVIS) 2-3V*L*
3 series · 3 of 3 positions shown · non-contrast
Comparison: None.

CLINICAL DATA: Pain along the anterior aspect of the left hip for 4
days. No known injury

EXAM:
LEFT HIP (WITH PELVIS) 2-3 VIEWS

[pelvis ap]
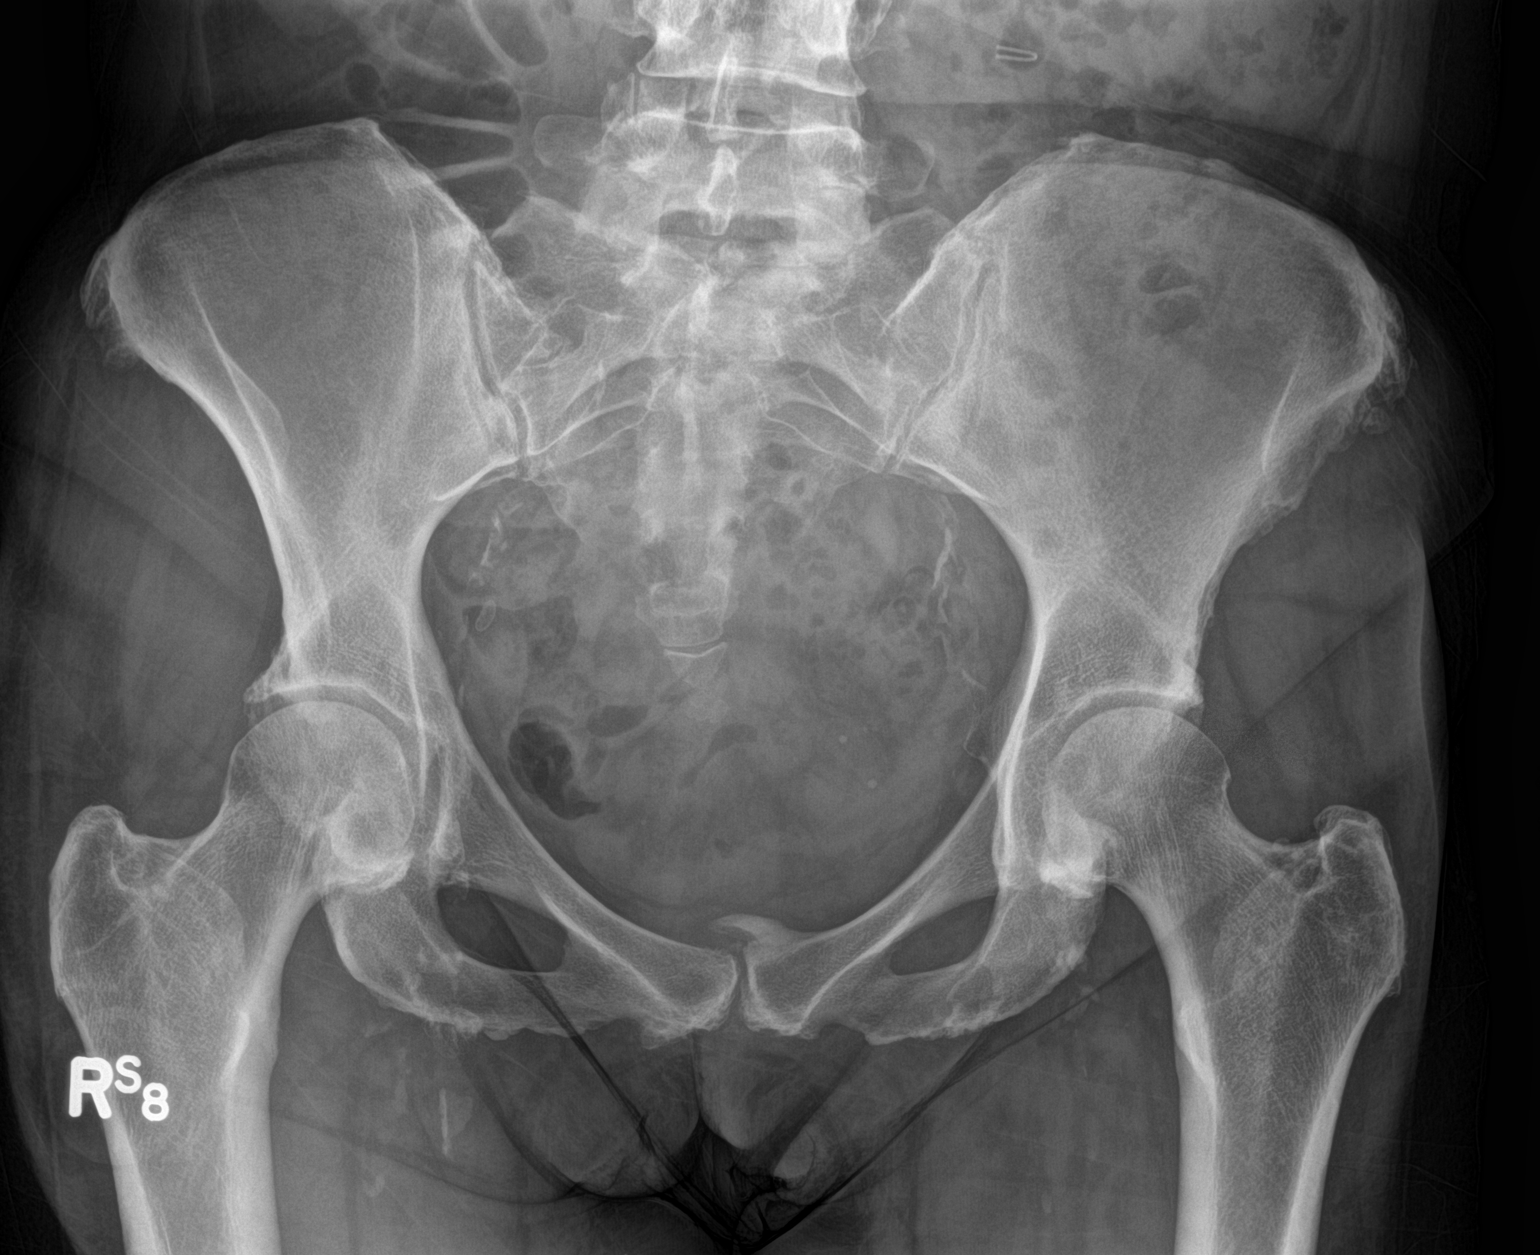

[hip ap]
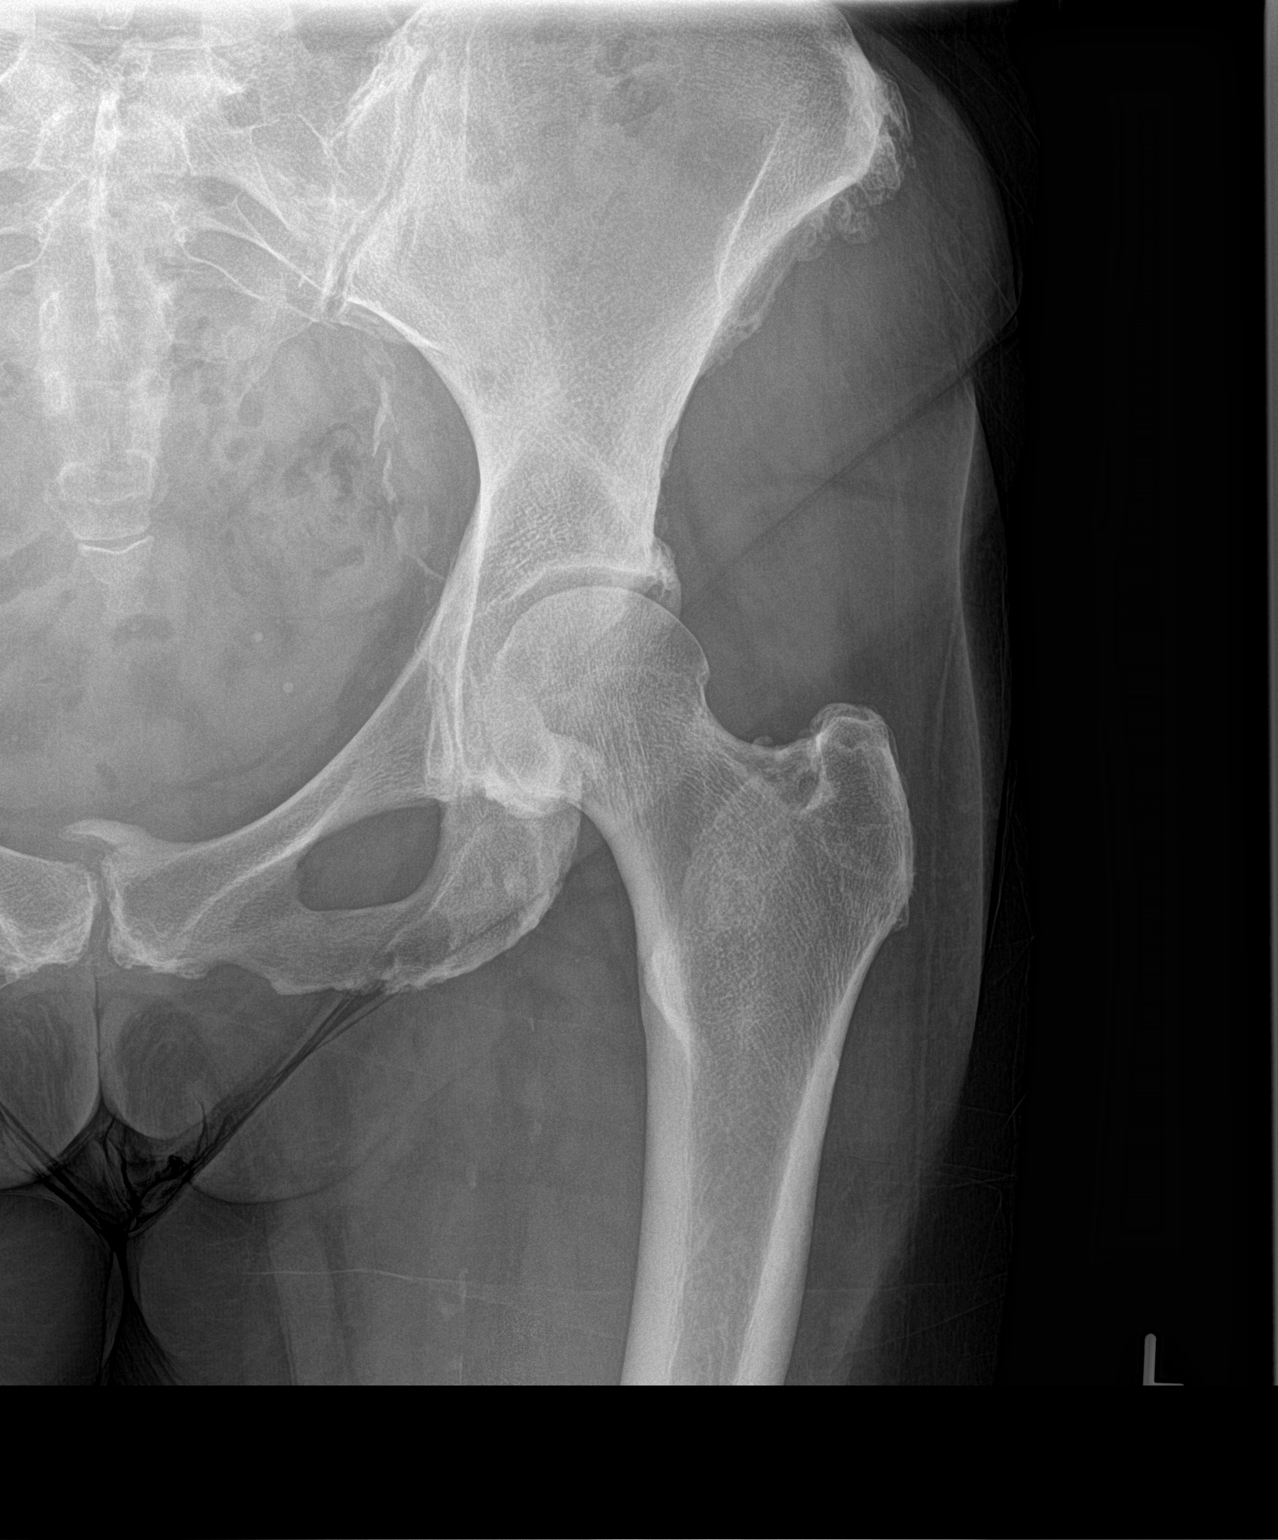

[hip lat]
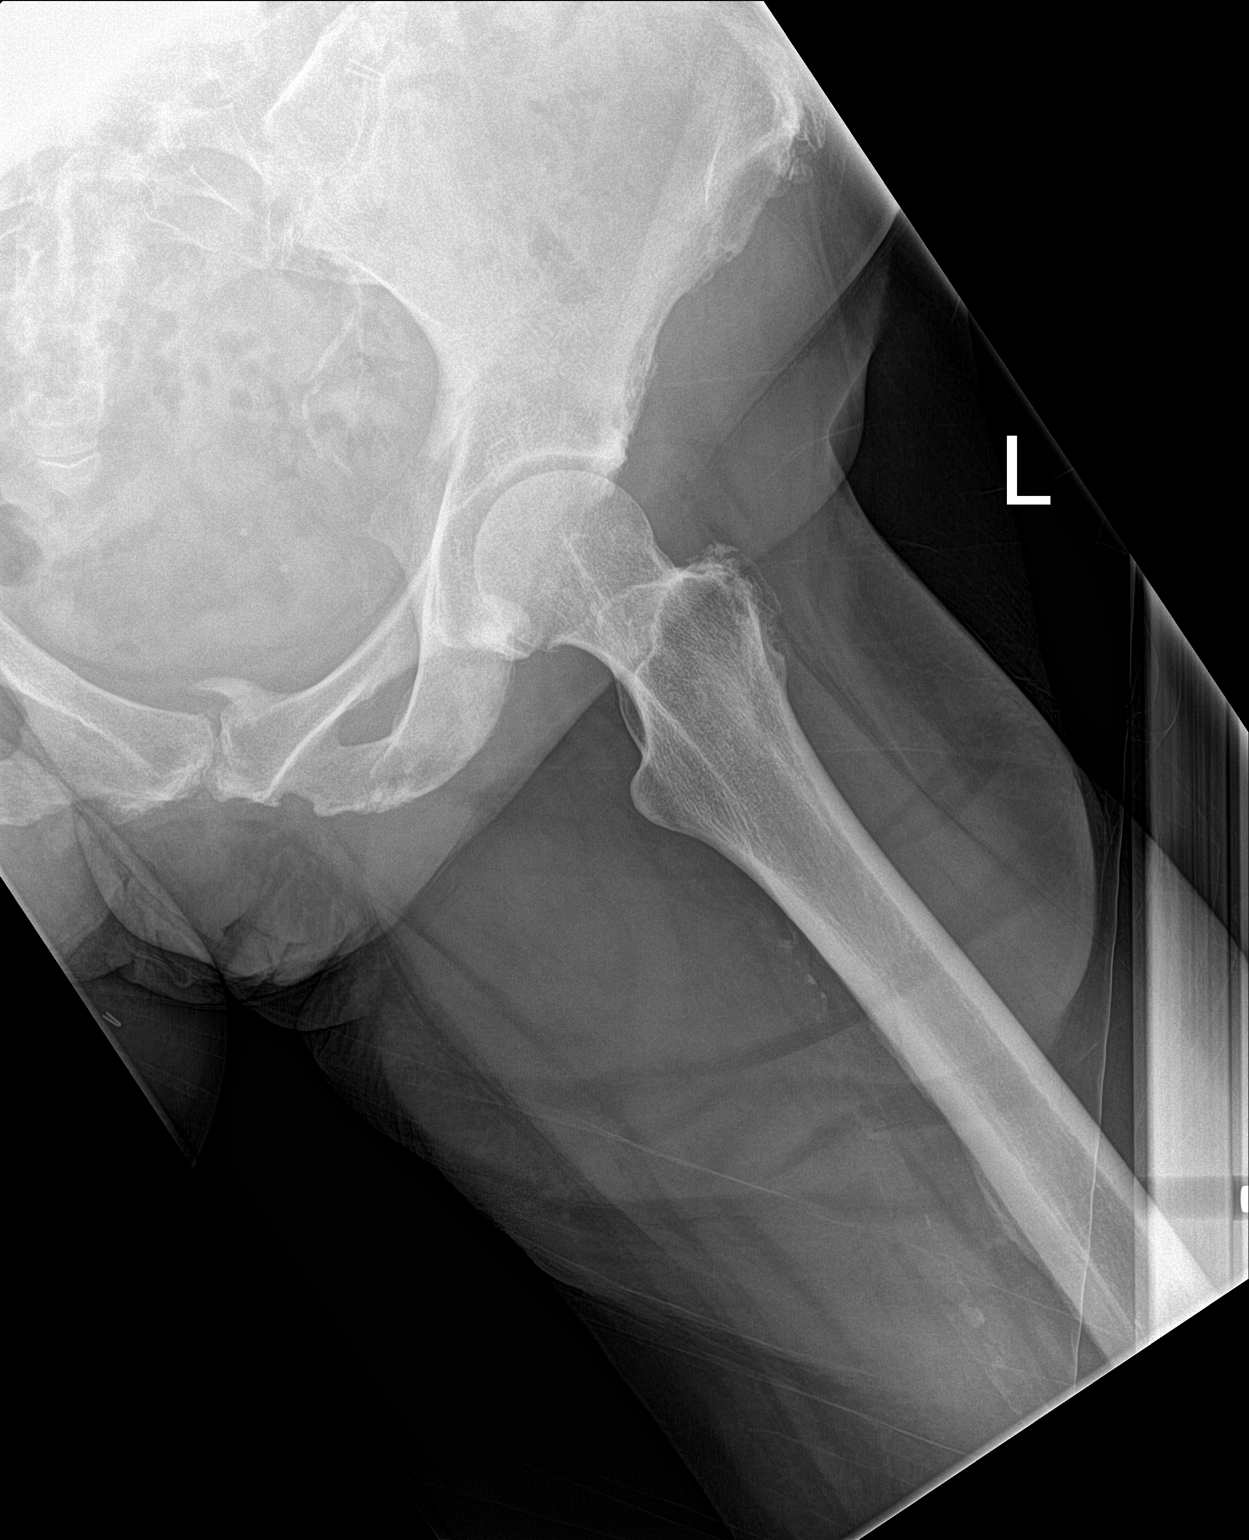

[3 of 3 positions shown; findings below may reference images not displayed]

FINDINGS: There is no fracture, erosion, or focal bone lesion. Enthesopathic
changes, especially to the iliac wings and symphysis pubis.
Atherosclerotic calcifications in the pelvis and thighs.
IMPRESSION: No explanation for acute hip pain.

## 2015-09-18 ENCOUNTER — Ambulatory Visit (INDEPENDENT_AMBULATORY_CARE_PROVIDER_SITE_OTHER): Payer: Medicare Other | Admitting: Ophthalmology

## 2015-09-19 ENCOUNTER — Ambulatory Visit (HOSPITAL_BASED_OUTPATIENT_CLINIC_OR_DEPARTMENT_OTHER): Payer: Medicare Other | Attending: Cardiology | Admitting: Cardiology

## 2015-09-19 DIAGNOSIS — G4733 Obstructive sleep apnea (adult) (pediatric): Secondary | ICD-10-CM | POA: Insufficient documentation

## 2015-09-19 DIAGNOSIS — I493 Ventricular premature depolarization: Secondary | ICD-10-CM | POA: Diagnosis not present

## 2015-09-22 NOTE — Procedures (Signed)
   Patient Name: Taylor [name], Taylor [name] Date: 09/19/2015 Gender: Female D.O.B: [birth year] Age (years): 70 Referring Provider: Loralie Champagne Height (inches): 64 Interpreting Physician: Fransico Him MD, ABSM Weight (lbs): 162 RPSGT: Laren Everts BMI: 25 MRN: [id number] Neck Size: 15.00  CLINICAL INFORMATION The patient is referred for a CPAP titration to treat sleep apnea.   SLEEP STUDY TECHNIQUE As per the AASM Manual for the Scoring of Sleep and Associated Events v2.3 (April 2016) with a hypopnea requiring 4% desaturations. The channels recorded and monitored were frontal, central and occipital EEG, electrooculogram (EOG), submentalis EMG (chin), nasal and oral airflow, thoracic and abdominal wall motion, anterior tibialis EMG, snore microphone, electrocardiogram, and pulse oximetry. Continuous positive airway pressure (CPAP) was initiated at the beginning of the study and titrated to treat sleep-disordered breathing.  MEDICATIONS Medications taken by the patient :Reviewed in the chart Medications administered by patient during sleep study : No sleep medicine administered.  TECHNICIAN COMMENTS Comments added by technician: Patient was restless all through the night.  Comments added by scorer: N/A  RESPIRATORY PARAMETERS Optimal PAP Pressure (cm): N/A  AHI at Optimal Pressure (/hr):N/A Overall Minimal O2 (%):91.00 Supine % at Optimal Pressure (%):N/A Minimal O2 at Optimal Pressure (%): N/A      SLEEP ARCHITECTURE The study was initiated at 10:03:36 PM and ended at 5:11:34 AM. Sleep onset time was 29.7 minutes and the sleep efficiency was 44.9%. The total sleep time was 192.0 minutes. The patient spent 29.17% of the night in stage N1 sleep, 55.73% in stage N2 sleep, 0.00% in stage N3 and 15.10% in REM.Stage REM latency was 212.5 minutes Wake after sleep onset was 206.3. Alpha intrusion was absent. Supine sleep was 14.84%.  CARDIAC DATA The 2 lead EKG demonstrated sinus  rhythm. The mean heart rate was 53.85 beats per minute. Other EKG findings include: PVCs.  LEG MOVEMENT DATA The total Periodic Limb Movements of Sleep (PLMS) were 13. The PLMS index was 4.06. A PLMS index of <15 is considered normal in adults.  IMPRESSIONS - An optimal PAP pressure could not be achieved due to onging respiratory events. - Central sleep apnea was not noted during this titration (CAI = 0.9/h). - Significant oxygen desaturations were not observed during this titration (min O2 = 91.00%). - No snoring was audible during this study. - 2-lead EKG demonstrated: PVCs - Clinically significant periodic limb movements were not noted during this study. Arousals associated with PLMs were rare.  DIAGNOSIS - Obstructive Sleep Apnea (327.23 [G47.33 ICD-10])  RECOMMENDATIONS - BiPAP titration in lab due to patient not being adequately titrated on CPAP due to ongoing respiratory events. - Avoid alcohol, sedatives and other CNS depressants that may worsen sleep apnea and disrupt normal sleep architecture. - Sleep hygiene should be reviewed to assess factors that may improve sleep quality. - Weight management and regular exercise should be initiated or continued. - Return to Sleep Center for re-evaluation after 10 weeks of therapy   [place], [place] [facility] [employer] [facility]  ELECTRONICALLY SIGNED ON:  09/22/2015, 9:18 PM [place] PH: [phone]   FX: [phone] [place]

## 2015-09-23 ENCOUNTER — Telehealth: Payer: Self-pay | Admitting: Cardiology

## 2015-09-23 NOTE — Telephone Encounter (Signed)
New Message  Pt voiced she is returning [place] call.  Pt voiced to contact her via mobile.  Please follow up with pt.

## 2015-09-23 NOTE — Progress Notes (Signed)
09/23/15 Left message to call back for recent titration results.

## 2015-09-24 NOTE — Progress Notes (Signed)
Per Jonni Sanger with [facility], we can order an Auto BiPAP with Min/Max and PS settings - this will avoid having to send the patient back to the lab for a third time.     Routing to Dr. Radford Pax for approval/what to order.

## 2015-09-24 NOTE — Progress Notes (Signed)
09/24/15 2:54 PM - patient informed of results from Titration.   She understands the benefits from using BiPAP machine, but she really does not want to go back to the sleep lab a 3rd night if she does not have to.  Since she failed CPAP titration in the lab, I have reached out to [place] to see if insurance will approve an auto BiPAP and we can titrate from home.   Patient is aware that once I have an answer from [facility] AND Dr. Radford Pax approves,  I will call to let her know and I will place the order.   She understands that if this is not an option, she will have to return to the sleep lap for BiPAP Titration.   I will call her before anything is scheduled to let her know what can be done.   Patient appreciated me trying to do something to keep her from going back to the lab.

## 2015-09-27 NOTE — Progress Notes (Signed)
Patient is aware that machine has been ordered and [facility] will be notified.   She is aware that they will call her to schedule set-up.

## 2015-09-27 NOTE — Addendum Note (Signed)
Addended by: Katina Dung on: 09/27/2015 09:45 AM   Modules accepted: Orders

## 2015-10-02 ENCOUNTER — Telehealth: Payer: Self-pay | Admitting: Cardiology

## 2015-10-02 NOTE — Telephone Encounter (Signed)
New message    Home health calling regarding BiPAP machine. Has some questions.

## 2015-10-03 NOTE — Telephone Encounter (Signed)
Left message for patient to call back  

## 2015-10-03 NOTE — Telephone Encounter (Signed)
Spoke with patient.  She was just wanting a better understanding of the auto-titration process. I have explained everything to her and she was appreciative of the call.

## 2015-10-25 DIAGNOSIS — H04123 Dry eye syndrome of bilateral lacrimal glands: Secondary | ICD-10-CM | POA: Diagnosis not present

## 2015-10-25 DIAGNOSIS — Z961 Presence of intraocular lens: Secondary | ICD-10-CM | POA: Diagnosis not present

## 2015-10-25 DIAGNOSIS — D3132 Benign neoplasm of left choroid: Secondary | ICD-10-CM | POA: Diagnosis not present

## 2015-10-25 DIAGNOSIS — H40013 Open angle with borderline findings, low risk, bilateral: Secondary | ICD-10-CM | POA: Diagnosis not present

## 2015-10-25 DIAGNOSIS — H10413 Chronic giant papillary conjunctivitis, bilateral: Secondary | ICD-10-CM | POA: Diagnosis not present

## 2015-10-30 ENCOUNTER — Ambulatory Visit (INDEPENDENT_AMBULATORY_CARE_PROVIDER_SITE_OTHER): Payer: Medicare Other | Admitting: Ophthalmology

## 2015-10-30 DIAGNOSIS — H43813 Vitreous degeneration, bilateral: Secondary | ICD-10-CM | POA: Diagnosis not present

## 2015-10-30 DIAGNOSIS — E11319 Type 2 diabetes mellitus with unspecified diabetic retinopathy without macular edema: Secondary | ICD-10-CM | POA: Diagnosis not present

## 2015-10-30 DIAGNOSIS — I10 Essential (primary) hypertension: Secondary | ICD-10-CM | POA: Diagnosis not present

## 2015-10-30 DIAGNOSIS — D3132 Benign neoplasm of left choroid: Secondary | ICD-10-CM | POA: Diagnosis not present

## 2015-10-30 DIAGNOSIS — H338 Other retinal detachments: Secondary | ICD-10-CM | POA: Diagnosis not present

## 2015-10-30 DIAGNOSIS — H35033 Hypertensive retinopathy, bilateral: Secondary | ICD-10-CM

## 2015-10-30 DIAGNOSIS — E113591 Type 2 diabetes mellitus with proliferative diabetic retinopathy without macular edema, right eye: Secondary | ICD-10-CM | POA: Diagnosis not present

## 2015-10-30 DIAGNOSIS — E113292 Type 2 diabetes mellitus with mild nonproliferative diabetic retinopathy without macular edema, left eye: Secondary | ICD-10-CM

## 2015-11-07 DIAGNOSIS — Z23 Encounter for immunization: Secondary | ICD-10-CM | POA: Diagnosis not present

## 2015-11-17 ENCOUNTER — Encounter (HOSPITAL_COMMUNITY): Payer: Self-pay | Admitting: *Deleted

## 2015-11-17 ENCOUNTER — Observation Stay (HOSPITAL_COMMUNITY): Payer: Medicare Other

## 2015-11-17 ENCOUNTER — Inpatient Hospital Stay (HOSPITAL_COMMUNITY)
Admission: EM | Admit: 2015-11-17 | Discharge: 2015-11-20 | DRG: 247 | Disposition: A | Discharge status: Home / Self Care | Payer: Medicare Other | Attending: Internal Medicine | Admitting: Internal Medicine

## 2015-11-17 ENCOUNTER — Emergency Department (HOSPITAL_COMMUNITY): Payer: Medicare Other

## 2015-11-17 DIAGNOSIS — M7989 Other specified soft tissue disorders: Secondary | ICD-10-CM | POA: Diagnosis not present

## 2015-11-17 DIAGNOSIS — E1065 Type 1 diabetes mellitus with hyperglycemia: Secondary | ICD-10-CM | POA: Diagnosis not present

## 2015-11-17 DIAGNOSIS — Z7984 Long term (current) use of oral hypoglycemic drugs: Secondary | ICD-10-CM

## 2015-11-17 DIAGNOSIS — I110 Hypertensive heart disease with heart failure: Secondary | ICD-10-CM | POA: Diagnosis not present

## 2015-11-17 DIAGNOSIS — I25810 Atherosclerosis of coronary artery bypass graft(s) without angina pectoris: Secondary | ICD-10-CM | POA: Diagnosis present

## 2015-11-17 DIAGNOSIS — E1051 Type 1 diabetes mellitus with diabetic peripheral angiopathy without gangrene: Secondary | ICD-10-CM | POA: Diagnosis not present

## 2015-11-17 DIAGNOSIS — Z853 Personal history of malignant neoplasm of breast: Secondary | ICD-10-CM

## 2015-11-17 DIAGNOSIS — I5032 Chronic diastolic (congestive) heart failure: Secondary | ICD-10-CM | POA: Diagnosis not present

## 2015-11-17 DIAGNOSIS — I25110 Atherosclerotic heart disease of native coronary artery with unstable angina pectoris: Secondary | ICD-10-CM | POA: Diagnosis not present

## 2015-11-17 DIAGNOSIS — I119 Hypertensive heart disease without heart failure: Secondary | ICD-10-CM | POA: Diagnosis present

## 2015-11-17 DIAGNOSIS — F329 Major depressive disorder, single episode, unspecified: Secondary | ICD-10-CM | POA: Diagnosis present

## 2015-11-17 DIAGNOSIS — G4733 Obstructive sleep apnea (adult) (pediatric): Secondary | ICD-10-CM | POA: Diagnosis present

## 2015-11-17 DIAGNOSIS — Z8249 Family history of ischemic heart disease and other diseases of the circulatory system: Secondary | ICD-10-CM

## 2015-11-17 DIAGNOSIS — I25709 Atherosclerosis of coronary artery bypass graft(s), unspecified, with unspecified angina pectoris: Secondary | ICD-10-CM

## 2015-11-17 DIAGNOSIS — Z955 Presence of coronary angioplasty implant and graft: Secondary | ICD-10-CM

## 2015-11-17 DIAGNOSIS — Z9842 Cataract extraction status, left eye: Secondary | ICD-10-CM

## 2015-11-17 DIAGNOSIS — Z951 Presence of aortocoronary bypass graft: Secondary | ICD-10-CM

## 2015-11-17 DIAGNOSIS — R079 Chest pain, unspecified: Secondary | ICD-10-CM | POA: Diagnosis not present

## 2015-11-17 DIAGNOSIS — Z87891 Personal history of nicotine dependence: Secondary | ICD-10-CM

## 2015-11-17 DIAGNOSIS — I10 Essential (primary) hypertension: Secondary | ICD-10-CM | POA: Diagnosis not present

## 2015-11-17 DIAGNOSIS — E7800 Pure hypercholesterolemia, unspecified: Secondary | ICD-10-CM | POA: Diagnosis present

## 2015-11-17 DIAGNOSIS — Z79899 Other long term (current) drug therapy: Secondary | ICD-10-CM

## 2015-11-17 DIAGNOSIS — R0789 Other chest pain: Secondary | ICD-10-CM | POA: Diagnosis not present

## 2015-11-17 DIAGNOSIS — Z888 Allergy status to other drugs, medicaments and biological substances status: Secondary | ICD-10-CM

## 2015-11-17 DIAGNOSIS — I872 Venous insufficiency (chronic) (peripheral): Secondary | ICD-10-CM | POA: Diagnosis present

## 2015-11-17 DIAGNOSIS — M79662 Pain in left lower leg: Secondary | ICD-10-CM | POA: Diagnosis not present

## 2015-11-17 DIAGNOSIS — Z9989 Dependence on other enabling machines and devices: Secondary | ICD-10-CM

## 2015-11-17 DIAGNOSIS — Z7982 Long term (current) use of aspirin: Secondary | ICD-10-CM

## 2015-11-17 DIAGNOSIS — K210 Gastro-esophageal reflux disease with esophagitis: Secondary | ICD-10-CM | POA: Diagnosis present

## 2015-11-17 DIAGNOSIS — I200 Unstable angina: Secondary | ICD-10-CM | POA: Diagnosis present

## 2015-11-17 DIAGNOSIS — IMO0002 Reserved for concepts with insufficient information to code with codable children: Secondary | ICD-10-CM | POA: Diagnosis present

## 2015-11-17 DIAGNOSIS — R0602 Shortness of breath: Secondary | ICD-10-CM | POA: Diagnosis not present

## 2015-11-17 DIAGNOSIS — Z961 Presence of intraocular lens: Secondary | ICD-10-CM

## 2015-11-17 DIAGNOSIS — F419 Anxiety disorder, unspecified: Secondary | ICD-10-CM | POA: Diagnosis present

## 2015-11-17 DIAGNOSIS — Z86711 Personal history of pulmonary embolism: Secondary | ICD-10-CM

## 2015-11-17 DIAGNOSIS — R001 Bradycardia, unspecified: Secondary | ICD-10-CM | POA: Diagnosis present

## 2015-11-17 DIAGNOSIS — Z9841 Cataract extraction status, right eye: Secondary | ICD-10-CM

## 2015-11-17 DIAGNOSIS — Z7902 Long term (current) use of antithrombotics/antiplatelets: Secondary | ICD-10-CM

## 2015-11-17 DIAGNOSIS — Z9012 Acquired absence of left breast and nipple: Secondary | ICD-10-CM

## 2015-11-17 LAB — BASIC METABOLIC PANEL
ANION GAP: 9 (ref 5–15)
BUN: 14 mg/dL (ref 6–20)
CHLORIDE: 105 mmol/L (ref 101–111)
CO2: 25 mmol/L (ref 22–32)
Calcium: 10.1 mg/dL (ref 8.9–10.3)
Creatinine, Ser: 1.05 mg/dL — ABNORMAL HIGH (ref 0.44–1.00)
GFR, EST NON AFRICAN AMERICAN: 53 mL/min — AB (ref >60)
Glucose, Bld: 133 mg/dL — ABNORMAL HIGH (ref 65–99)
POTASSIUM: 3.9 mmol/L (ref 3.5–5.1)
SODIUM: 139 mmol/L (ref 135–145)

## 2015-11-17 LAB — CBC
HEMATOCRIT: 37.7 % (ref 36.0–46.0)
HEMOGLOBIN: 12.6 g/dL (ref 12.0–15.0)
MCH: 30.6 pg (ref 26.0–34.0)
MCHC: 33.4 g/dL (ref 30.0–36.0)
MCV: 91.5 fL (ref 78.0–100.0)
Platelets: 310 10*3/uL (ref 150–400)
RBC: 4.12 MIL/uL (ref 3.87–5.11)
RDW: 12.8 % (ref 11.5–15.5)
WBC: 8.4 10*3/uL (ref 4.0–10.5)

## 2015-11-17 LAB — TSH: TSH: 2.162 u[IU]/mL (ref 0.350–4.500)

## 2015-11-17 LAB — TROPONIN I

## 2015-11-17 LAB — I-STAT TROPONIN, ED: Troponin i, poc: 0.01 ng/mL (ref 0.00–0.08)

## 2015-11-17 LAB — GLUCOSE, CAPILLARY: GLUCOSE-CAPILLARY: 98 mg/dL (ref 65–99)

## 2015-11-17 MED ORDER — MORPHINE SULFATE (PF) 2 MG/ML IV SOLN: 2.0000 mg | INTRAVENOUS | Status: DC | PRN

## 2015-11-17 MED ORDER — ACETAMINOPHEN 325 MG PO TABS: 650.0000 mg | ORAL_TABLET | ORAL | Status: DC | PRN

## 2015-11-17 MED ORDER — BRIMONIDINE TARTRATE 0.2 % OP SOLN
1.0000 [drp] | Freq: Two times a day (BID) | OPHTHALMIC | Status: DC
  Administered 2015-11-17 – 2015-11-20 (×6): 1 [drp] via OPHTHALMIC
  Filled 2015-11-17: qty 5

## 2015-11-17 MED ORDER — SIMVASTATIN 40 MG PO TABS
40.0000 mg | ORAL_TABLET | Freq: Every day | ORAL | Status: DC
  Administered 2015-11-17: 40 mg via ORAL
  Filled 2015-11-17: qty 1

## 2015-11-17 MED ORDER — NITROGLYCERIN 0.4 MG SL SUBL: 0.4000 mg | SUBLINGUAL_TABLET | SUBLINGUAL | Status: DC | PRN

## 2015-11-17 MED ORDER — ENOXAPARIN SODIUM 40 MG/0.4ML ~~LOC~~ SOLN
40.0000 mg | SUBCUTANEOUS | Status: DC
  Administered 2015-11-17: 40 mg via SUBCUTANEOUS
  Filled 2015-11-17 (×2): qty 0.4

## 2015-11-17 MED ORDER — INSULIN ASPART 100 UNIT/ML ~~LOC~~ SOLN
0.0000 [IU] | Freq: Three times a day (TID) | SUBCUTANEOUS | Status: DC
  Administered 2015-11-19 – 2015-11-20 (×3): 2 [IU] via SUBCUTANEOUS

## 2015-11-17 MED ORDER — SERTRALINE HCL 25 MG PO TABS
75.0000 mg | ORAL_TABLET | Freq: Every day | ORAL | Status: DC
  Administered 2015-11-18 – 2015-11-20 (×3): 75 mg via ORAL
  Filled 2015-11-17 (×3): qty 1

## 2015-11-17 MED ORDER — TIMOLOL MALEATE 0.5 % OP SOLN
1.0000 [drp] | Freq: Two times a day (BID) | OPHTHALMIC | Status: DC
  Administered 2015-11-17 – 2015-11-20 (×6): 1 [drp] via OPHTHALMIC
  Filled 2015-11-17: qty 5

## 2015-11-17 MED ORDER — ISOSORBIDE MONONITRATE ER 30 MG PO TB24
30.0000 mg | ORAL_TABLET | Freq: Every day | ORAL | Status: DC
  Administered 2015-11-17: 30 mg via ORAL
  Filled 2015-11-17: qty 1

## 2015-11-17 MED ORDER — TICAGRELOR 90 MG PO TABS
90.0000 mg | ORAL_TABLET | Freq: Two times a day (BID) | ORAL | Status: DC
  Administered 2015-11-17 – 2015-11-20 (×6): 90 mg via ORAL
  Filled 2015-11-17 (×6): qty 1

## 2015-11-17 MED ORDER — ONDANSETRON HCL 4 MG/2ML IJ SOLN: 4.0000 mg | Freq: Four times a day (QID) | INTRAMUSCULAR | Status: DC | PRN

## 2015-11-17 MED ORDER — ASPIRIN 81 MG PO CHEW
324.0000 mg | CHEWABLE_TABLET | Freq: Once | ORAL | Status: AC
  Administered 2015-11-17: 324 mg via ORAL
  Filled 2015-11-17: qty 4

## 2015-11-17 MED ORDER — LISINOPRIL 5 MG PO TABS
5.0000 mg | ORAL_TABLET | Freq: Every day | ORAL | Status: DC
  Administered 2015-11-18 – 2015-11-20 (×3): 5 mg via ORAL
  Filled 2015-11-17 (×3): qty 1

## 2015-11-17 MED ORDER — DORZOLAMIDE HCL 2 % OP SOLN
1.0000 [drp] | Freq: Two times a day (BID) | OPHTHALMIC | Status: DC
  Administered 2015-11-17 – 2015-11-20 (×6): 1 [drp] via OPHTHALMIC
  Filled 2015-11-17: qty 10

## 2015-11-17 MED ORDER — IOPAMIDOL (ISOVUE-370) INJECTION 76%
100.0000 mL | Freq: Once | INTRAVENOUS | Status: AC | PRN
  Administered 2015-11-17: 100 mL via INTRAVENOUS

## 2015-11-17 MED ORDER — BRIMONIDINE TARTRATE-TIMOLOL 0.2-0.5 % OP SOLN: 1.0000 [drp] | Freq: Two times a day (BID) | OPHTHALMIC | Status: DC

## 2015-11-17 MED ORDER — POLYETHYL GLYCOL-PROPYL GLYCOL 0.4-0.3 % OP GEL: Freq: Every day | OPHTHALMIC | Status: DC | PRN

## 2015-11-17 MED ORDER — ALPRAZOLAM 0.25 MG PO TABS: 0.2500 mg | ORAL_TABLET | Freq: Every day | ORAL | Status: DC | PRN

## 2015-11-17 MED ORDER — ASPIRIN EC 81 MG PO TBEC
81.0000 mg | DELAYED_RELEASE_TABLET | Freq: Every day | ORAL | Status: DC
  Administered 2015-11-18 – 2015-11-20 (×3): 81 mg via ORAL
  Filled 2015-11-17 (×3): qty 1

## 2015-11-17 NOTE — Progress Notes (Signed)
Pt arrived to unit from ER.  Telemetry applied and CCMD notified x 2.  Pt oriented to room including call light and telephone.  Pt plan of care discussed with Pt.  Pt education provided on lab work that would be drawn over night.  Pt indicates understanding.  Pt denies chest pain.  Pt stable.  Will cont to monitor.

## 2015-11-17 NOTE — ED Notes (Signed)
Attempted to call report

## 2015-11-17 NOTE — ED Triage Notes (Signed)
Pt reports having cardiac history. Having intermittent mid chest pains for one month but now more severe. Had onset of mid "grinding" chest pain today when getting up to sweep. No resp distress noted. Reports recent left leg swelling.

## 2015-11-17 NOTE — ED Provider Notes (Signed)
[place] DEPT Provider Note   CSN: [id number] Arrival date & time: 11/17/15  1754     History   Chief Complaint Chief Complaint  Patient presents with  . Chest Pain    HPI Taylor [name] is a 70 y.o. female.  HPI   Chest pain, like "angina pains" off and on for a little while. Was hurting back and jaw similar to 78yrs ago.  Any time try to exert self even a little having pain.  1 month, pressure like chest pain, grinding.  Middle of chest to upper back and jaw. Today just chest.  No nausea, sometimes mild dyspnea.    Left leg with swelling starting yesterday, it has always swelled more than the other but today was more significant swelling.  Pain feels more like heart pain than prior PE.  Taking brilinta, isosorbide  No discomfort right now.  There with exertion, has to lay down for it to go away.  Cath 02/2015 with DES RCA, 6/17 repeat cath patent rca stent medical management 50-60%stenosis LCx    Past Medical History:  Diagnosis Date  . Cancer of left breast ([place])   . Complication of anesthesia   . Coronary artery disease    a. 2006 s/p CABG x 4 (LIMA->LAD, VG->Diag->OM, VG->RPDA;  b. 07/2014 MV: EF 51%, medium defect of mild severity, likely artifact, low risk.   c. 02/2015: [place] s/p DES to [facility]  . Depression   . Hx of tobacco use, presenting hazards to health    a. quit 2006.  [name] Hypercholesteremia   . Hypertension   . Hypertensive heart disease   . Left carotid bruit    a. 09/2014 Carotid U/S: 1-39% bilat ICA stenosis.  [name] PAD (peripheral artery disease) ([place])    a. 11/2014 ABI: R: 0.63, L 0.59;  b. 11/2014 Periph Angio: bilat pop occlusions. L - short w/ reconstituion via collats in dist pop w/ 2 vessel runoff, R long w/ reconstitution in prox tib/peroneal arteries-->med Rx w/ pletal.  . Pulmonary embolism ([place]) a. 2014.  [name] Reflux esophagitis   . Type II diabetes mellitus [facility])     Patient Active Problem List   Diagnosis Date Noted  . Pain and swelling  of left lower leg 11/17/2015  . Coronary artery disease   . Unstable angina ([place]) 02/21/2015  . Hypertensive heart disease   . Type II diabetes mellitus ([place])   . PAD (peripheral artery disease) ([place]) 11/03/2014  . Chest pain 08/06/2014  . Nausea with vomiting 05/08/2014  . Allergic reaction 05/08/2014  . Hypercholesteremia   . Hypertension   . Diabetes mellitus ([place])   . Hx of tobacco use, presenting hazards to health 12/21/2013  . Varicose veins of left lower extremity 12/18/2013  . Cardiac enzymes elevated 03/01/2012  . Chronic diastolic heart failure, NYHA class 1 ([place]) 03/01/2012  . Bilateral pulmonary embolism ([place]) 02/29/2012  . 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 03/06/2011  . Chest pain 11/30/2010  . Left carotid bruit 02/26/2009  . CHRONIC LEFT LEG VENOUS INSUFFICIENCY 07/02/2008  . PVD 05/14/2008  . EDEMA 05/14/2008  . HYPERCHOLESTEROLEMIA 05/10/2008  . CAD, ARTERY BYPASS GRAFT 05/10/2008  . ESOPHAGITIS, REFLUX 05/10/2008  . DM (diabetes mellitus) type I uncontrolled, periph vascular disorder ([place]) 10/18/2006  . DEPRESSION 10/18/2006  . Essential hypertension 10/18/2006    Past Surgical History:  Procedure Laterality Date  . BREAST BIOPSY Left ~ 2012  . BUNIONECTOMY Left 1970s  . CARDIAC CATHETERIZATION  04/2004  .  CARDIAC CATHETERIZATION N/A 02/21/2015   Procedure: Left Heart Cath and Cors/Grafts Angiography;  Surgeon: Belva Crome, MD;  Location: [place] CV LAB;  Service: Cardiovascular;  Laterality: N/A;  . CARDIAC CATHETERIZATION N/A 02/21/2015   Procedure: Coronary Stent Intervention;  Surgeon: Belva Crome, MD;  Location: [place] CV LAB;  Service: Cardiovascular;  Laterality: N/A;  . CARDIAC CATHETERIZATION N/A 02/21/2015   Procedure: Intravascular Pressure Wire/FFR Study;  Surgeon: Belva Crome, MD;  Location: [place] [facility];  Service: Cardiovascular;  Laterality: N/A;  . CARDIAC CATHETERIZATION N/A 06/20/2015   Procedure: Left  Heart Cath and Cors/Grafts Angiography;  Surgeon: Larey Dresser, MD;  Location: [place] [facility];  Service: Cardiovascular;  Laterality: N/A;  . CATARACT EXTRACTION W/ INTRAOCULAR LENS  IMPLANT, BILATERAL Bilateral 1980s-1990s  . CORONARY ANGIOPLASTY WITH STENT PLACEMENT  02/21/2015   "1 stent"  . CORONARY ARTERY BYPASS GRAFT  05/2004   "CABG X4"  . EYE SURGERY    . LAPAROSCOPIC CHOLECYSTECTOMY  1990s  . MASTECTOMY Left ~ 2012  . PERIPHERAL VASCULAR CATHETERIZATION N/A 12/12/2014   Procedure: Abdominal Aortogram;  Surgeon: Wellington Hampshire, MD;  Location: [place] [facility];  Service: Cardiovascular;  Laterality: N/A;  . RETINAL DETACHMENT SURGERY Right 1990s    OB History    No data available       Home Medications    Prior to Admission medications   Medication Sig Start Date End Date Taking? Authorizing Provider  acetaminophen (TYLENOL) 500 MG tablet Take 1,000 mg by mouth every 6 (six) hours as needed for headache (pain).    Yes Historical Provider, MD  ALPRAZolam Duanne Moron) 0.25 MG tablet Take 0.25 mg by mouth daily as needed for anxiety.    Yes Historical Provider, MD  aspirin EC 81 MG tablet Take 1 tablet (81 mg total) by mouth daily. 05/04/13  Yes Larey Dresser, MD  brimonidine-timolol (COMBIGAN) 0.2-0.5 % ophthalmic solution Place 1 drop into both eyes 2 (two) times daily.   Yes Historical Provider, MD  carvedilol (COREG) 6.25 MG tablet Take 6.25 mg by mouth 2 (two) times daily with a meal.   Yes Historical Provider, MD  dorzolamide (TRUSOPT) 2 % ophthalmic solution Place 1 drop into both eyes 2 (two) times daily.    Yes Historical Provider, MD  isosorbide mononitrate (IMDUR) 30 MG 24 hr tablet Take 30 mg by mouth at bedtime.    Yes Historical Provider, MD  lisinopril (PRINIVIL,ZESTRIL) 5 MG tablet TAKE ONE TABLET BY MOUTH ONCE DAILY 09/05/15  Yes Larey Dresser, MD  metFORMIN (GLUCOPHAGE) 500 MG tablet Take 1,000 mg by mouth 2 (two) times daily with a meal.    Yes Historical  Provider, MD  nitroGLYCERIN (NITROSTAT) 0.4 MG SL tablet Place 0.4 mg under the tongue every 5 (five) minutes as needed for chest pain (MAX 3 doses in 15 minutes).    Yes Historical Provider, MD  Polyethyl Glycol-Propyl Glycol (SYSTANE OP) Place 1 drop into both eyes daily as needed (dry eyes).   Yes Historical Provider, MD  sertraline (ZOLOFT) 50 MG tablet Take 75 mg by mouth daily.   Yes Historical Provider, MD  simvastatin (ZOCOR) 40 MG tablet Take 1 tablet (40 mg total) by mouth daily. Patient taking differently: Take 40 mg by mouth at bedtime.  07/25/15  Yes Larey Dresser, MD  ticagrelor (BRILINTA) 90 MG TABS tablet Take 1 tablet (90 mg total) by mouth 2 (two) times daily. 02/22/15  Yes Woodfin Ganja  Grandville Silos, PA-C    Family History Family History  Problem Relation Age of Onset  . Coronary artery disease Mother 35    status post CABG   . Heart attack Father 45    deceased secondary to fatal  first myocardial infarction  . Coronary artery disease Sister 85    alive had bypass surgery  . Cancer Sister   . Deep vein thrombosis Cousin     Neice had after knee surgery    Social History Social History  Substance Use Topics  . Smoking status: Former Smoker    Packs/day: 0.50    Years: 15.00    Types: Cigarettes    Quit date: 05/11/2004  . Smokeless tobacco: Never Used  . Alcohol use No     Allergies   Lipitor [atorvastatin] and Niacin   Review of Systems Review of Systems  Constitutional: Negative for fever.  HENT: Negative for sore throat.   Eyes: Negative for visual disturbance.  Respiratory: Positive for shortness of breath. Negative for cough.   Cardiovascular: Positive for chest pain and leg swelling.  Gastrointestinal: Negative for abdominal pain, nausea and vomiting.  Genitourinary: Negative for difficulty urinating.  Musculoskeletal: Positive for neck pain. Negative for back pain.  Skin: Negative for rash.  Neurological: Negative for syncope and headaches.      Physical Exam Updated Vital Signs BP (!) 142/56 (BP Location: Left Arm)   Pulse (!) 46   Temp 97.6 F (36.4 C) (Oral)   Resp 16   Ht 5\' 7"  (1.702 m)   Wt 163 lb 2.3 oz (74 kg)   SpO2 99%   BMI 25.55 kg/m   Physical Exam  Constitutional: She is oriented to person, place, and time. She appears well-developed and well-nourished. No distress.  HENT:  Head: Normocephalic and atraumatic.  Eyes: Conjunctivae and EOM are normal.  Neck: Normal range of motion.  Cardiovascular: Normal rate, regular rhythm, normal heart sounds and intact distal pulses.  Exam reveals no gallop and no friction rub.   No murmur heard. Pulmonary/Chest: Effort normal and breath sounds normal. No respiratory distress. She has no wheezes. She has no rales.  Abdominal: Soft. She exhibits no distension. There is no tenderness. There is no guarding.  Musculoskeletal: She exhibits edema (LLE).       Left lower leg: She exhibits tenderness.  Neurological: She is alert and oriented to person, place, and time.  Skin: Skin is warm and dry. No rash noted. She is not diaphoretic. No erythema.  Nursing note and vitals reviewed.    ED Treatments / Results  Labs (all labs ordered are listed, but only abnormal results are displayed) Labs Reviewed  BASIC METABOLIC PANEL - Abnormal; Notable for the following:       Result Value   Glucose, Bld 133 (*)    Creatinine, Ser 1.05 (*)    GFR calc non Af Amer 53 (*)    All other components within normal limits  GLUCOSE, CAPILLARY - Abnormal; Notable for the following:    Glucose-Capillary 115 (*)    All other components within normal limits  GLUCOSE, CAPILLARY - Abnormal; Notable for the following:    Glucose-Capillary 127 (*)    All other components within normal limits  CBC  TSH  TROPONIN I  TROPONIN I  TROPONIN I  GLUCOSE, CAPILLARY  I-STAT TROPOININ, ED    EKG  EKG Interpretation  Date/Time:  Sunday November 17 2015 17:59:21 EST Ventricular Rate:   58 PR Interval:  150  QRS Duration: 92 QT Interval:  422 QTC Calculation: 414 R Axis:   -50 Text Interpretation:  Sinus bradycardia Left axis deviation Incomplete right bundle branch block Possible Anterior infarct , age undetermined Abnormal ECG No significant change since last tracing Confirmed by [facility] MD, [place] ([id number]) on 11/17/2015 8:02:13 PM       Radiology Dg Chest 2 View  Result Date: 11/17/2015 CLINICAL DATA:  Chest pain.  CABG 11 years ago. EXAM: CHEST  2 VIEW COMPARISON:  02/21/2015 FINDINGS: Prior median sternotomy. Minimal pectus excavatum deformity. Moderate thoracic spondylosis. Midline trachea. Borderline cardiomegaly. Atherosclerosis in the transverse aorta. No pleural effusion or pneumothorax. Minimal volume loss in the medial right lung base. No congestive failure. IMPRESSION: No acute cardiopulmonary disease. Borderline cardiomegaly, without congestive failure. Electronically Signed   By: Abigail Miyamoto M.D.   On: 11/17/2015 18:53   Ct Angio Chest Pe W And/or Wo Contrast  Result Date: 11/17/2015 CLINICAL DATA:  Chest pain, shortness of breath, and bilateral leg swelling. Previous history of pulmonary embolus. Diabetes. EXAM: CT ANGIOGRAPHY CHEST WITH CONTRAST TECHNIQUE: Multidetector CT imaging of the chest was performed using the standard protocol during bolus administration of intravenous contrast. Multiplanar CT image reconstructions and MIPs were obtained to evaluate the vascular anatomy. CONTRAST:  100 mL Isovue 370 COMPARISON:  02/29/2012 FINDINGS: Cardiovascular: Technically adequate study with good opacification of the central and segmental pulmonary arteries. No focal filling defects. No evidence of significant pulmonary embolus. Mild cardiac enlargement. Thoracic aorta is not dilated. Mediastinum/Nodes: Postoperative changes consistent with sternotomy and coronary artery bypass. No significant lymphadenopathy. Esophagus is decompressed. Lungs/Pleura: Evaluation is  limited due to respiratory motion artifact. Emphysematous changes in the lungs. Atelectasis in the lung bases. Mild mosaic attenuation pattern may be due to air trapping or edema. Vague focal nodule demonstrated in the superior segment right lower lobe measuring 5 mm diameter. This was present on the previous study without significant change. No pleural effusion. No pneumothorax. Upper Abdomen: No acute abnormality. Musculoskeletal: Degenerative changes in the spine. No destructive bone lesions. Review of the MIP images confirms the above findings. IMPRESSION: No evidence of significant pulmonary embolus. Cardiac enlargement with possible edema. No focal consolidation. Mild emphysematous changes. Electronically Signed   By: Lucienne Capers M.D.   On: 11/17/2015 21:37    Procedures Procedures (including critical care time)  Medications Ordered in ED Medications  sertraline (ZOLOFT) tablet 75 mg (75 mg Oral Given 11/18/15 1048)  lisinopril (PRINIVIL,ZESTRIL) tablet 5 mg (5 mg Oral Given 11/18/15 1049)  simvastatin (ZOCOR) tablet 40 mg (40 mg Oral Given 11/17/15 2232)  nitroGLYCERIN (NITROSTAT) SL tablet 0.4 mg (not administered)  ticagrelor (BRILINTA) tablet 90 mg (90 mg Oral Given 11/18/15 1049)  aspirin EC tablet 81 mg (81 mg Oral Given 11/18/15 1049)  dorzolamide (TRUSOPT) 2 % ophthalmic solution 1 drop (1 drop Both Eyes Given 11/18/15 1051)  ALPRAZolam (XANAX) tablet 0.25 mg (not administered)  acetaminophen (TYLENOL) tablet 650 mg (not administered)  ondansetron (ZOFRAN) injection 4 mg (not administered)  insulin aspart (novoLOG) injection 0-15 Units (0 Units Subcutaneous Not Given 11/18/15 0700)  enoxaparin (LOVENOX) injection 40 mg (40 mg Subcutaneous Given 11/17/15 2230)  morphine 2 MG/ML injection 2 mg (not administered)  brimonidine (ALPHAGAN) 0.2 % ophthalmic solution 1 drop (1 drop Both Eyes Given 11/18/15 1051)    And  timolol (TIMOPTIC) 0.5 % ophthalmic solution 1 drop (1 drop Both Eyes  Given 11/18/15 1049)  isosorbide mononitrate (IMDUR) 24 hr tablet 60 mg (not administered)  aspirin chewable tablet 324 mg (324 mg Oral Given 11/17/15 2009)  iopamidol (ISOVUE-370) 76 % injection 100 mL (100 mLs Intravenous Contrast Given 11/17/15 2104)     Initial Impression / Assessment and Plan / ED Course  I have reviewed the triage vital signs and the nursing notes.  Pertinent labs & imaging results that were available during my care of the patient were reviewed by me and considered in my medical decision making (see chart for details).  Clinical Course    70yo female with history of CAD, DVT/PE, PAD, htn, hlpd, presents with concern for chest pain and left leg swelling.  EKG without significant changes from prior. Initial troponin negative.  Hx concerning for esclating angina with exertional chest pressure, however has LLE swelling and hx of DVT/PE in past and will order CTA PE study. Given ASA. No CP in ED. Discussed with Cardiology. Will admit to medicine for further CP evaluation. CT study pending at time of admission.  Final Clinical Impressions(s) / ED Diagnoses   Final diagnoses:  Pain and swelling of left lower leg  Chest pain, unspecified type    New Prescriptions Current Discharge Medication List       Gareth Morgan, MD 11/18/15 1218

## 2015-11-17 NOTE — H&P (Signed)
History and Physical    Taylor [name] [id number] DOB: 1945-12-07 DOA: 11/17/2015  PCP: Melinda Crutch, MD  Cardiology: Dr. Aundra Dubin  Patient coming from: Home  Chief Complaint: Chest pain  HPI: Taylor [name] is a 70 y.o. woman with a history of CAD S/P CABG, DES to RCA earlier this year with repeat cath in June for recurrent chest pain, PAD, HTN, Hypercholesterolemia, Breast Cancer, and prior PE (has been off of warfarin for two years now) who presents to the ED for evaluation of chest pain.  The patient complains of a left sided chest pain described as a "pulling sensation" or pressure that radiates to her left shoulder and jaw.  It has been associated with nausea and light-headedness.  It occurs at rest and with exertion.  It is worse with exertion.  No significant shortness of breath.  No LOC.  No cough.  No fever.  She has developed LLE pain and swelling in the past 24 hours.  No prolonged trips/recent travel.  She is not on OCPs/estrogen.  She feels that her pain feels more like cardiac chest pain than the pain that she recalls with PE.  ED Course: The patient received full strength aspirin in the ED.  EKG appears unchanged compared to prior.  First troponin normal.  CBC, BMP unremarkable.  CTA chest pending.  Sinus bradycardia noted in the ED.  Cardiology called by the ED attending; deferred further recommendations colleagues in the AM.  Hospitalist asked to admit.  Review of Systems: As per HPI otherwise 10 point review of systems negative.    Past Medical History:  Diagnosis Date  . Cancer of left breast ([place])   . Complication of anesthesia   . Coronary artery disease    a. 2006 s/p CABG x 4 (LIMA->LAD, VG->Diag->OM, VG->RPDA;  b. 07/2014 MV: EF 51%, medium defect of mild severity, likely artifact, low risk.   c. 02/2015: [place] s/p DES to [facility]  . Depression   . Hx of tobacco use, presenting hazards to health    a. quit 2006.  [name] Hypercholesteremia   . Hypertension   . Hypertensive  heart disease   . Left carotid bruit    a. 09/2014 Carotid U/S: 1-39% bilat ICA stenosis.  [name] PAD (peripheral artery disease) ([place])    a. 11/2014 ABI: R: 0.63, L 0.59;  b. 11/2014 Periph Angio: bilat pop occlusions. L - short w/ reconstituion via collats in dist pop w/ 2 vessel runoff, R long w/ reconstitution in prox tib/peroneal arteries-->med Rx w/ pletal.  . Pulmonary embolism ([place]) a. 2014.  [name] Reflux esophagitis   . Type II diabetes mellitus ([place])     Past Surgical History:  Procedure Laterality Date  . BREAST BIOPSY Left ~ 2012  . BUNIONECTOMY Left 1970s  . CARDIAC CATHETERIZATION  04/2004  . CARDIAC CATHETERIZATION N/A 02/21/2015   Procedure: Left Heart Cath and Cors/Grafts Angiography;  Surgeon: Belva Crome, MD;  Location: [place] [facility];  Service: Cardiovascular;  Laterality: N/A;  . CARDIAC CATHETERIZATION N/A 02/21/2015   Procedure: Coronary Stent Intervention;  Surgeon: Belva Crome, MD;  Location: [place] [facility];  Service: Cardiovascular;  Laterality: N/A;  . CARDIAC CATHETERIZATION N/A 02/21/2015   Procedure: Intravascular Pressure Wire/FFR Study;  Surgeon: Belva Crome, MD;  Location: [place] [facility];  Service: Cardiovascular;  Laterality: N/A;  . CARDIAC CATHETERIZATION N/A 06/20/2015   Procedure: Left Heart Cath and Cors/Grafts Angiography;  Surgeon: Larey Dresser, MD;  Location:  [place] [facility];  Service: Cardiovascular;  Laterality: N/A;  . CATARACT EXTRACTION W/ INTRAOCULAR LENS  IMPLANT, BILATERAL Bilateral 1980s-1990s  . CORONARY ANGIOPLASTY WITH STENT PLACEMENT  02/21/2015   "1 stent"  . CORONARY ARTERY BYPASS GRAFT  05/2004   "CABG X4"  . EYE SURGERY    . LAPAROSCOPIC CHOLECYSTECTOMY  1990s  . MASTECTOMY Left ~ 2012  . PERIPHERAL VASCULAR CATHETERIZATION N/A 12/12/2014   Procedure: Abdominal Aortogram;  Surgeon: Wellington Hampshire, MD;  Location: [place] [facility];  Service: Cardiovascular;  Laterality: N/A;  . RETINAL DETACHMENT SURGERY Right 1990s      reports that she quit smoking about 11 years ago. Her smoking use included Cigarettes. She has a 7.50 pack-year smoking history. She has never used smokeless tobacco. She reports that she does not drink alcohol or use drugs.  She is married.  Allergies  Allergen Reactions  . Lipitor [Atorvastatin] Other (See Comments)    Muscle pain  . Niacin Other (See Comments)    burning    Family History  Problem Relation Age of Onset  . Coronary artery disease Mother 42    status post CABG   . Heart attack Father 71    deceased secondary to fatal  first myocardial infarction  . Coronary artery disease Sister 67    alive had bypass surgery  . Cancer Sister   . Deep vein thrombosis Cousin     Neice had after knee surgery     Prior to Admission medications   Medication Sig Start Date End Date Taking? Authorizing Provider  acetaminophen (TYLENOL) 500 MG tablet Take 1,000 mg by mouth every 6 (six) hours as needed for headache (pain).    Yes Historical Provider, MD  ALPRAZolam Duanne Moron) 0.25 MG tablet Take 0.25 mg by mouth daily as needed for anxiety.    Yes Historical Provider, MD  aspirin EC 81 MG tablet Take 1 tablet (81 mg total) by mouth daily. 05/04/13  Yes Larey Dresser, MD  brimonidine-timolol (COMBIGAN) 0.2-0.5 % ophthalmic solution Place 1 drop into both eyes 2 (two) times daily.   Yes Historical Provider, MD  carvedilol (COREG) 6.25 MG tablet Take 6.25 mg by mouth 2 (two) times daily with a meal.   Yes Historical Provider, MD  dorzolamide (TRUSOPT) 2 % ophthalmic solution Place 1 drop into both eyes 2 (two) times daily.    Yes Historical Provider, MD  isosorbide mononitrate (IMDUR) 30 MG 24 hr tablet Take 30 mg by mouth at bedtime.    Yes Historical Provider, MD  lisinopril (PRINIVIL,ZESTRIL) 5 MG tablet TAKE ONE TABLET BY MOUTH ONCE DAILY 09/05/15  Yes Larey Dresser, MD  metFORMIN (GLUCOPHAGE) 500 MG tablet Take 1,000 mg by mouth 2 (two) times daily with a meal.    Yes Historical  Provider, MD  nitroGLYCERIN (NITROSTAT) 0.4 MG SL tablet Place 0.4 mg under the tongue every 5 (five) minutes as needed for chest pain (MAX 3 doses in 15 minutes).    Yes Historical Provider, MD  Polyethyl Glycol-Propyl Glycol (SYSTANE OP) Place 1 drop into both eyes daily as needed (dry eyes).   Yes Historical Provider, MD  sertraline (ZOLOFT) 50 MG tablet Take 75 mg by mouth daily.   Yes Historical Provider, MD  simvastatin (ZOCOR) 40 MG tablet Take 1 tablet (40 mg total) by mouth daily. Patient taking differently: Take 40 mg by mouth at bedtime.  07/25/15  Yes Larey Dresser, MD  ticagrelor (BRILINTA) 90 MG TABS tablet Take  1 tablet (90 mg total) by mouth 2 (two) times daily. 02/22/15  Yes Eileen Stanford, PA-C    Physical Exam: Vitals:   11/17/15 1900 11/17/15 1930 11/17/15 2000 11/17/15 2045  BP: 124/64 115/60 (!) 126/52 (!) 124/52  Pulse: (!) 51 (!) 51 (!) 46 (!) 45  Resp: 21 16 20 13   Temp:      TempSrc:      SpO2: 98% 97% 97% 99%  Weight:      Height:          Constitutional: NAD, calm, comfortable Vitals:   11/17/15 1900 11/17/15 1930 11/17/15 2000 11/17/15 2045  BP: 124/64 115/60 (!) 126/52 (!) 124/52  Pulse: (!) 51 (!) 51 (!) 46 (!) 45  Resp: 21 16 20 13   Temp:      TempSrc:      SpO2: 98% 97% 97% 99%  Weight:      Height:       Eyes: PERRL, lids and conjunctivae normal ENMT: Mucous membranes are moist. Posterior pharynx clear of any exudate or lesions. Normal dentition.  Neck: normal appearance, supple, Respiratory: clear to auscultation bilaterally, no wheezing, no crackles. Normal respiratory effort. No accessory muscle use.  Cardiovascular: Bradycardic but regular.  No murmurs / rubs / gallops. Left leg swollen compared to right but no significant pitting.  Diminished pulses in her feet. GI: abdomen is soft and compressible.  No distention.  No tenderness.  Bowel sounds are present. Musculoskeletal:  No joint deformity in upper and lower extremities. Good  ROM, no contractures. Normal muscle tone. + calf tenderness with palpable nodule on the left.  No reproducible chest wall tenderness. Skin: no rashes, warm and dry Neurologic: No focal deficits. Psychiatric: Normal judgment and insight. Alert and oriented x 3. Normal mood.     Labs on Admission: I have personally reviewed following labs and imaging studies  CBC:  Recent Labs Lab 11/17/15 1802  WBC 8.4  HGB 12.6  HCT 37.7  MCV 91.5  PLT 99991111   Basic Metabolic Panel:  Recent Labs Lab 11/17/15 1802  NA 139  K 3.9  CL 105  CO2 25  GLUCOSE 133*  BUN 14  CREATININE 1.05*  CALCIUM 10.1   GFR: Estimated Creatinine Clearance: 54.1 mL/min (by C-G formula based on SCr of 1.05 mg/dL (H)).  Radiological Exams on Admission: Dg Chest 2 View  Result Date: 11/17/2015 CLINICAL DATA:  Chest pain.  CABG 11 years ago. EXAM: CHEST  2 VIEW COMPARISON:  02/21/2015 FINDINGS: Prior median sternotomy. Minimal pectus excavatum deformity. Moderate thoracic spondylosis. Midline trachea. Borderline cardiomegaly. Atherosclerosis in the transverse aorta. No pleural effusion or pneumothorax. Minimal volume loss in the medial right lung base. No congestive failure. IMPRESSION: No acute cardiopulmonary disease. Borderline cardiomegaly, without congestive failure. Electronically Signed   By: Abigail Miyamoto M.D.   On: 11/17/2015 18:53    EKG: Independently reviewed. Sinus bradycardia.  RBBB (not new).  Assessment/Plan Principal Problem:   Chest pain Active Problems:   DM (diabetes mellitus) type I uncontrolled, periph vascular disorder (HCC)   Essential hypertension   CAD, ARTERY BYPASS GRAFT   Pain and swelling of left lower leg      Chest pain, known history of CAD, prior CABG, recent stent --Place in observation with telemetry monitoring --Serial troponin --Echo in the AM --Cardiology consult in the AM --CTA chest pending to rule out PE --Continue baby aspirin and brilinta for now --Also on  statin, ACE-I, isosorbide.  Holding beta blocker for  bradycardia.  Bradycardia --Hold BB --Check TSH --Telemetry monitoring  Left leg pain and swelling --Venous ultrasound to rule out DVT  DM --HOLD metformin --SSI average scale AC  Anxiety/depression --Continue Zoloft --Xanax prn  DVT prophylaxis: Lovenox Code Status: FULL Family Communication: Patient alone in the ED at time of admission. Disposition Plan: To be determined. Consults called: Cardiology Admission status: Observation, telemetry   TIME SPENT: 60 minutes   Eber Jones MD [facility] Pager [phone]  If 7PM-7AM, please contact night-coverage [email or web address] Password [email or web address]  11/17/2015, 9:17 PM

## 2015-11-18 ENCOUNTER — Observation Stay (HOSPITAL_BASED_OUTPATIENT_CLINIC_OR_DEPARTMENT_OTHER): Payer: Medicare Other

## 2015-11-18 DIAGNOSIS — M7989 Other specified soft tissue disorders: Secondary | ICD-10-CM

## 2015-11-18 DIAGNOSIS — I25708 Atherosclerosis of coronary artery bypass graft(s), unspecified, with other forms of angina pectoris: Secondary | ICD-10-CM | POA: Diagnosis not present

## 2015-11-18 DIAGNOSIS — M79662 Pain in left lower leg: Secondary | ICD-10-CM | POA: Diagnosis not present

## 2015-11-18 DIAGNOSIS — R001 Bradycardia, unspecified: Secondary | ICD-10-CM | POA: Diagnosis not present

## 2015-11-18 DIAGNOSIS — E1065 Type 1 diabetes mellitus with hyperglycemia: Secondary | ICD-10-CM

## 2015-11-18 DIAGNOSIS — E1051 Type 1 diabetes mellitus with diabetic peripheral angiopathy without gangrene: Secondary | ICD-10-CM

## 2015-11-18 DIAGNOSIS — I25110 Atherosclerotic heart disease of native coronary artery with unstable angina pectoris: Secondary | ICD-10-CM | POA: Diagnosis not present

## 2015-11-18 DIAGNOSIS — F419 Anxiety disorder, unspecified: Secondary | ICD-10-CM | POA: Diagnosis not present

## 2015-11-18 DIAGNOSIS — K210 Gastro-esophageal reflux disease with esophagitis: Secondary | ICD-10-CM | POA: Diagnosis not present

## 2015-11-18 DIAGNOSIS — R0789 Other chest pain: Secondary | ICD-10-CM | POA: Diagnosis not present

## 2015-11-18 DIAGNOSIS — E7800 Pure hypercholesterolemia, unspecified: Secondary | ICD-10-CM | POA: Diagnosis not present

## 2015-11-18 DIAGNOSIS — I5032 Chronic diastolic (congestive) heart failure: Secondary | ICD-10-CM | POA: Diagnosis not present

## 2015-11-18 DIAGNOSIS — I10 Essential (primary) hypertension: Secondary | ICD-10-CM

## 2015-11-18 DIAGNOSIS — I110 Hypertensive heart disease with heart failure: Secondary | ICD-10-CM | POA: Diagnosis not present

## 2015-11-18 DIAGNOSIS — I872 Venous insufficiency (chronic) (peripheral): Secondary | ICD-10-CM | POA: Diagnosis not present

## 2015-11-18 DIAGNOSIS — G4733 Obstructive sleep apnea (adult) (pediatric): Secondary | ICD-10-CM | POA: Diagnosis not present

## 2015-11-18 LAB — GLUCOSE, CAPILLARY
GLUCOSE-CAPILLARY: 115 mg/dL — AB (ref 65–99)
GLUCOSE-CAPILLARY: 127 mg/dL — AB (ref 65–99)
GLUCOSE-CAPILLARY: 131 mg/dL — AB (ref 65–99)
Glucose-Capillary: 174 mg/dL — ABNORMAL HIGH (ref 65–99)

## 2015-11-18 LAB — TROPONIN I: Troponin I: <0.03 ng/mL (ref <0.03)

## 2015-11-18 LAB — PROTIME-INR
INR: 0.96
Prothrombin Time: 12.8 seconds (ref 11.4–15.2)

## 2015-11-18 MED ORDER — ROSUVASTATIN CALCIUM 20 MG PO TABS
20.0000 mg | ORAL_TABLET | Freq: Every day | ORAL | Status: DC
  Administered 2015-11-18: 20 mg via ORAL
  Filled 2015-11-18: qty 2

## 2015-11-18 MED ORDER — ISOSORBIDE MONONITRATE ER 60 MG PO TB24
60.0000 mg | ORAL_TABLET | Freq: Every day | ORAL | Status: DC
  Administered 2015-11-18 – 2015-11-19 (×2): 60 mg via ORAL
  Filled 2015-11-18 (×2): qty 1

## 2015-11-18 NOTE — Consult Note (Signed)
Cardiology Consult    Patient ID: Taylor [name] MRN: [id number], DOB/AGE: 17-Dec-1945   Admit date: 11/17/2015 Date of Consult: 11/18/2015  Primary Physician: Melinda Crutch, MD Reason for Consult: Chest Pain Primary Cardiologist: Dr. Aundra Dubin Requesting Provider: Dr. Eliseo Squires  Patient Profile    Taylor [name] is a 70 year old female with a past medical history significant for CAD s/p CABG in 05/2004, PAD, PE 02/2012 with unclear etiology (not on any anticoagulation), History of Breast Cancer s/p L mastectomy in 2011, HTN, DM II, and former smoker. Mother and father with MIs, sister with CABG at age 48.  History of Present Illness    Taylor [name] reports that for the past several months Taylor [name] has had chest pain with exertion. Taylor [name] says that the chest pain has progressively gotten worse and occurring with less exertion. It had gotten to where Taylor [name] was having chest pain 4 times a week. Over the past several days, Taylor [name] has had recurrent chest pain. Taylor [name] was trying to rake leaves and had a left sided chest pain that Taylor [name] describes as a pulling sensation. This radiated to her left shoulder and jaw. Taylor [name] had associated nausea and lightheadedness. Taylor [name] reports occasionally the chest pain is associated with shortness of breath but not on this occasion. The pain is improved after 1-2 hours of rest. Taylor [name] reports that Taylor [name] has no chest pain at rest, however, upon further questioning Taylor [name] does say Taylor [name] has some mild chest tightness even while at rest. Taylor [name] tells me that her chest pain feels like it did prior to having her CABG in 2006.   Past Medical History   Past Medical History:  Diagnosis Date  . Cancer of left breast ([place])   . Complication of anesthesia   . Coronary artery disease    a. 2006 s/p CABG x 4 (LIMA->LAD, VG->Diag->OM, VG->RPDA;  b. 07/2014 MV: EF 51%, medium defect of mild severity, likely artifact, low risk.   c. 02/2015: [place] s/p DES to [facility]  . Depression   . Hx of tobacco use, presenting hazards to health    a. quit  2006.  [name] Hypercholesteremia   . Hypertension   . Hypertensive heart disease   . Left carotid bruit    a. 09/2014 Carotid U/S: 1-39% bilat ICA stenosis.  [name] PAD (peripheral artery disease) ([place])    a. 11/2014 ABI: R: 0.63, L 0.59;  b. 11/2014 Periph Angio: bilat pop occlusions. L - short w/ reconstituion via collats in dist pop w/ 2 vessel runoff, R long w/ reconstitution in prox tib/peroneal arteries-->med Rx w/ pletal.  . Pulmonary embolism ([place]) a. 2014.  [name] Reflux esophagitis   . Type II diabetes mellitus ([place])     Past Surgical History:  Procedure Laterality Date  . BREAST BIOPSY Left ~ 2012  . BUNIONECTOMY Left 1970s  . CARDIAC CATHETERIZATION  04/2004  . CARDIAC CATHETERIZATION N/A 02/21/2015   Procedure: Left Heart Cath and Cors/Grafts Angiography;  Surgeon: Belva Crome, MD;  Location: [place] [facility];  Service: Cardiovascular;  Laterality: N/A;  . CARDIAC CATHETERIZATION N/A 02/21/2015   Procedure: Coronary Stent Intervention;  Surgeon: Belva Crome, MD;  Location: [place] [facility];  Service: Cardiovascular;  Laterality: N/A;  . CARDIAC CATHETERIZATION N/A 02/21/2015   Procedure: Intravascular Pressure Wire/FFR Study;  Surgeon: Belva Crome, MD;  Location: [place] [facility];  Service: Cardiovascular;  Laterality: N/A;  . CARDIAC CATHETERIZATION N/A 06/20/2015   Procedure: Left Heart Cath and Cors/Grafts Angiography;  Surgeon: Larey Dresser, MD;  Location: [place] [facility];  Service: Cardiovascular;  Laterality: N/A;  . CATARACT EXTRACTION W/ INTRAOCULAR LENS  IMPLANT, BILATERAL Bilateral 1980s-1990s  . CORONARY ANGIOPLASTY WITH STENT PLACEMENT  02/21/2015   "1 stent"  . CORONARY ARTERY BYPASS GRAFT  05/2004   "CABG X4"  . EYE SURGERY    . LAPAROSCOPIC CHOLECYSTECTOMY  1990s  . MASTECTOMY Left ~ 2012  . PERIPHERAL VASCULAR CATHETERIZATION N/A 12/12/2014   Procedure: Abdominal Aortogram;  Surgeon: Wellington Hampshire, MD;  Location: [place] [facility];  Service: Cardiovascular;   Laterality: N/A;  . RETINAL DETACHMENT SURGERY Right 1990s     Allergies  Allergies  Allergen Reactions  . Lipitor [Atorvastatin] Other (See Comments)    Muscle pain  . Niacin Other (See Comments)    burning    Inpatient Medications    . aspirin EC  81 mg Oral Daily  . brimonidine  1 drop Both Eyes BID   And  . timolol  1 drop Both Eyes BID  . dorzolamide  1 drop Both Eyes BID  . enoxaparin (LOVENOX) injection  40 mg Subcutaneous Q24H  . insulin aspart  0-15 Units Subcutaneous TID WC  . isosorbide mononitrate  60 mg Oral QHS  . lisinopril  5 mg Oral Daily  . sertraline  75 mg Oral Daily  . simvastatin  40 mg Oral QHS  . ticagrelor  90 mg Oral BID    Family History    Family History  Problem Relation Age of Onset  . Coronary artery disease Mother 17    status post CABG   . Heart attack Father 90    deceased secondary to fatal  first myocardial infarction  . Coronary artery disease Sister 21    alive had bypass surgery  . Cancer Sister   . Deep vein thrombosis Cousin     Neice had after knee surgery    Social History    Social History   Social History  . Marital status: Married    Spouse name: N/A  . Number of children: N/A  . Years of education: N/A   Occupational History  . Not on file.   Social History Main Topics  . Smoking status: Former Smoker    Packs/day: 0.50    Years: 15.00    Types: Cigarettes    Quit date: 05/11/2004  . Smokeless tobacco: Never Used  . Alcohol use No  . Drug use: No  . Sexual activity: Not Currently   Other Topics Concern  . Not on file   Social History Narrative   Lives in [place] with her husband.     Review of Systems    General:  No chills, fever, night sweats or weight changes.  Cardiovascular:  No chest pain, dyspnea on exertion, edema, orthopnea, palpitations, paroxysmal nocturnal dyspnea. Dermatological: No rash, lesions/masses Respiratory: No cough, dyspnea Urologic: No hematuria,  dysuria Abdominal:   No nausea, vomiting, diarrhea, bright red blood per rectum, melena, or hematemesis Neurologic:  No visual changes, wkns, changes in mental status. All other systems reviewed and are otherwise negative except as noted above.  Physical Exam    Blood pressure (!) 142/56, pulse (!) 46, temperature 97.6 F (36.4 C), temperature source Oral, resp. rate 16, height 5\' 7"  (1.702 m), weight 163 lb 2.3 oz (74 kg), SpO2 99 %.  General: Pleasant, NAD Psych: Normal affect. Neuro: Alert and oriented X 3. Moves all extremities spontaneously. HEENT: Normal  Neck: Supple  without bruits or JVD. Lungs:  Resp regular and unlabored, CTA. Heart: RRR no s3, s4, or murmurs. Abdomen: Soft, non-tender, non-distended, BS + x 4.  Extremities: No clubbing, cyanosis or edema. DP/PT/Radials 2+ and equal bilaterally.  Labs    Troponin [facility] of Care Test)  Recent Labs  11/17/15 1845  TROPIPOC 0.01    Recent Labs  11/17/15 2135 11/18/15 0025 11/18/15 0327  TROPONINI <0.03 <0.03 <0.03   Lab Results  Component Value Date   WBC 8.4 11/17/2015   HGB 12.6 11/17/2015   HCT 37.7 11/17/2015   MCV 91.5 11/17/2015   PLT 310 11/17/2015    Recent Labs Lab 11/17/15 1802  NA 139  K 3.9  CL 105  CO2 25  BUN 14  CREATININE 1.05*  CALCIUM 10.1  GLUCOSE 133*   Lab Results  Component Value Date   CHOL 112 (L) 09/06/2015   HDL 31 (L) 09/06/2015   LDLCALC 61 09/06/2015   TRIG 99 09/06/2015   Lab Results  Component Value Date   DDIMER 12.54 (H) 02/29/2012     Radiology Studies    Dg Chest 2 View  Result Date: 11/17/2015 CLINICAL DATA:  Chest pain.  CABG 11 years ago. EXAM: CHEST  2 VIEW COMPARISON:  02/21/2015 FINDINGS: Prior median sternotomy. Minimal pectus excavatum deformity. Moderate thoracic spondylosis. Midline trachea. Borderline cardiomegaly. Atherosclerosis in the transverse aorta. No pleural effusion or pneumothorax. Minimal volume loss in the medial right lung base.  No congestive failure. IMPRESSION: No acute cardiopulmonary disease. Borderline cardiomegaly, without congestive failure. Electronically Signed   By: Abigail Miyamoto M.D.   On: 11/17/2015 18:53   Ct Angio Chest Pe W And/or Wo Contrast  Result Date: 11/17/2015 CLINICAL DATA:  Chest pain, shortness of breath, and bilateral leg swelling. Previous history of pulmonary embolus. Diabetes. EXAM: CT ANGIOGRAPHY CHEST WITH CONTRAST TECHNIQUE: Multidetector CT imaging of the chest was performed using the standard protocol during bolus administration of intravenous contrast. Multiplanar CT image reconstructions and MIPs were obtained to evaluate the vascular anatomy. CONTRAST:  100 mL Isovue 370 COMPARISON:  02/29/2012 FINDINGS: Cardiovascular: Technically adequate study with good opacification of the central and segmental pulmonary arteries. No focal filling defects. No evidence of significant pulmonary embolus. Mild cardiac enlargement. Thoracic aorta is not dilated. Mediastinum/Nodes: Postoperative changes consistent with sternotomy and coronary artery bypass. No significant lymphadenopathy. Esophagus is decompressed. Lungs/Pleura: Evaluation is limited due to respiratory motion artifact. Emphysematous changes in the lungs. Atelectasis in the lung bases. Mild mosaic attenuation pattern may be due to air trapping or edema. Vague focal nodule demonstrated in the superior segment right lower lobe measuring 5 mm diameter. This was present on the previous study without significant change. No pleural effusion. No pneumothorax. Upper Abdomen: No acute abnormality. Musculoskeletal: Degenerative changes in the spine. No destructive bone lesions. Review of the MIP images confirms the above findings. IMPRESSION: No evidence of significant pulmonary embolus. Cardiac enlargement with possible edema. No focal consolidation. Mild emphysematous changes. Electronically Signed   By: Lucienne Capers M.D.   On: 11/17/2015 21:37    EKG &  Cardiac Imaging    EKG:   Echocardiogram:   Assessment & Plan    Unstable Angina: Troponin negative x3 overnight. EKG with no new changes. ECHO pending. CTA chest was negative in the ED. Taylor [name] describes chest tightness even while at rest. Worse with exertion. Symptoms are concerning and progressively worsening. FFR of the right coronary artery can be considered given the presence of a new  stenosis proximal to the previously placed stent.  CAD s/p CABG: Underwent CABG in 05/2014. LIMA-LAD, sequential SVG-D and OM, SVG-PDA. Recent DES to mRCA (2/17).  Taylor [name] had a residual moderate LCx stenosis that was not hemodynamically significant by FFR.  Taylor [name] re-developed chest discomfort and had intermediate risk Cardiolite in 6/17. Repeat cardiac cath showed patent RCA stent with 50% stenosis prior to stent (this did not appear significant).  The LCx stenosis appeared less severe than prior, 50-60% stenosis.  Taylor [name] was managed medically and started on Imdur. Currently on ASA 81, Ticagrelor, Simvastatin 40 mg, Imdur 30 mg daily, lisinopril 5 mg, Coreg 6.25 mg bid. Referred to cardiac rehab after 07/09/15 visit. Only on Simvastatin 40 mg qhs, was increased from 20 mg on 6.2017 visit. Last lipid panel 08/2015 with total cholesterol 112, LDL 61, HDL 31.  Bradycardia: HR in the 40-50s. Holding her BB. TSH wnl.   PAD: Known severe PAD.  Stable claudication.  Follows with Dr [name].  Taylor [name] is not on cilostazol given DAPT. No rest pain or pedal ulcerations.    Hx of PE in 2/14: CTA negative on admission. Dopplers pending.  On initial hypercoagulable workup, lupus anticoagulant was positive, raising concern for antiphospholipid antibody syndrome.  However, repeat antiphospholipid antibody panels in 8/14 and 1/15 were negative and her oncologist let her stop warfarin.   LLE pain and swelling: reports it is chronic. Doppler pending.   HTN: BP well controlled.  DM: per primary  Signed, Maryellen Pile PGY2 IM  Resident [phone] 11/18/2015, 1:33 PM   Attending Note:   The patient was seen and examined.  Agree with assessment and plan as noted above.  Changes made to the above note as needed.  Patient seen and independently examined with Maryellen Pile, Resident MD.   We discussed all aspects of the encounter. I agree with the assessment and plan as stated above.  1. CAD :  Complex hx .   I have personally reviewed the angiograms from her last 2 caths.    Taylor [name] has several small vessels that have significant stenosis  - these certainly could be causing her angina but these have been present for several years.  He symptoms have worsened over the past several months which suggests that Taylor [name] has new stenosis present. A myoview would be very difficult to intrepret - and I dont think it will be helpful I have suggested that we proceed with cardiac cath. We discussed the risks, benefits, options. Taylor [name] understands and agrees to precede.   2. Hyperlipidemia. :   Tolerates low dose simvastatin - intolerent to atorvastatin.  The low dose simva does not seem to be  working for her.  Will try crestor 20 mg a day Taylor [name] will need addition of zetia or perhaps Repatha if Taylor [name] continues to have progression of her CAD    I have spent a total of 40 minutes with patient reviewing hospital  notes , telemetry, EKGs, labs and examining patient as well as establishing an assessment and plan that was discussed with the patient. > 50% of time was spent in direct patient care.     Thayer Headings, Brooke Bonito., MD, [facility] 11/18/2015, 3:20 PM [address],  [place] Pager [phone]

## 2015-11-18 NOTE — Progress Notes (Signed)
**  Preliminary report by tech**  Left lower extremity venous duplex completed. There is no evidence of deep or superficial vein thrombosis involving the left lower extremity. All visualized vessels appear patent and compressible. There is no evidence of a Baker's cyst on the left.  11/18/15 5:01 PM Taylor [name] RVT

## 2015-11-18 NOTE — Progress Notes (Signed)
PROGRESS NOTE    Taylor [name]  [id number] DOB: [birth year] DOA: 11/17/2015 PCP: Melinda Crutch, MD   Outpatient Specialists:    Brief Narrative:  Taylor [name] is a 70 y.o. woman with a history of CAD S/P CABG, DES to RCA earlier this year with repeat cath in June for recurrent chest pain, PAD, HTN, Hypercholesterolemia, Breast Cancer, and prior PE (has been off of warfarin for two years now) who presents to the ED for evaluation of chest pain.  The patient complains of a left sided chest pain described as a "pulling sensation" or pressure that radiates to her left shoulder and jaw.  It has been associated with nausea and light-headedness.  It occurs at rest and with exertion.  It is worse with exertion.  No significant shortness of breath.    Assessment & Plan:   Principal Problem:   Chest pain Active Problems:   DM (diabetes mellitus) type I uncontrolled, periph vascular disorder (HCC)   Essential hypertension   CAD, ARTERY BYPASS GRAFT   Pain and swelling of left lower leg   Chest pain, known history of CAD, prior CABG, recent stent -- telemetry --troponin negative --Echo --Cardiology consult-- recent cath June 2017 --CTA chest negative for PE --Continue baby aspirin and brilinta for now --Holding beta blocker for bradycardia. -increase imdur-- watch HR  Bradycardia --Hold BB -- TSH ok --Telemetry monitoring  Left leg pain and swelling --Venous ultrasound to rule out DVT  DM --HOLD metformin --SSI average scale AC  Anxiety/depression --Continue Zoloft --Xanax prn   DVT prophylaxis:  Lovenox   Code Status: Full Code   Family Communication:   Disposition Plan:     Consultants:   cardiology     Subjective: Continues to have pain with exertion  Objective: Vitals:   11/17/15 1930 11/17/15 2000 11/17/15 2045 11/17/15 2133  BP: 115/60 (!) 126/52 (!) 124/52 (!) 142/56  Pulse: (!) 51 (!) 46 (!) 45 (!) 46  Resp: 16 20 13 16   Temp:     97.6 F (36.4 C)  TempSrc:    Oral  SpO2: 97% 97% 99% 99%  Weight:    74 kg (163 lb 2.3 oz)  Height:       No intake or output data in the 24 hours ending 11/18/15 1223 Filed Weights   11/17/15 1803 11/17/15 2133  Weight: 74.7 kg (164 lb 11.2 oz) 74 kg (163 lb 2.3 oz)    Examination:  General exam: Appears calm and comfortable  Respiratory system: Clear to auscultation. Respiratory effort normal. Cardiovascular system: S1 & S2 heard, RRR. No JVD, murmurs, rubs, gallops or clicks. No pedal edema. Gastrointestinal system: Abdomen is nondistended, soft and nontender. No organomegaly or masses felt. Normal bowel sounds heard. Central nervous system: Alert and oriented. No focal neurological deficits.     Data Reviewed: I have personally reviewed following labs and imaging studies  CBC:  Recent Labs Lab 11/17/15 1802  WBC 8.4  HGB 12.6  HCT 37.7  MCV 91.5  PLT 99991111   Basic Metabolic Panel:  Recent Labs Lab 11/17/15 1802  NA 139  K 3.9  CL 105  CO2 25  GLUCOSE 133*  BUN 14  CREATININE 1.05*  CALCIUM 10.1   GFR: Estimated Creatinine Clearance: 53.9 mL/min (by C-G formula based on SCr of 1.05 mg/dL (H)). Liver Function Tests: No results for input(s): AST, ALT, ALKPHOS, BILITOT, PROT, ALBUMIN in the last 168 hours. No results for input(s): LIPASE, AMYLASE in the last 168  hours. No results for input(s): AMMONIA in the last 168 hours. Coagulation Profile: No results for input(s): INR, PROTIME in the last 168 hours. Cardiac Enzymes:  Recent Labs Lab 11/17/15 2135 11/18/15 0025 11/18/15 0327  TROPONINI <0.03 <0.03 <0.03   BNP (last 3 results) No results for input(s): PROBNP in the last 8760 hours. HbA1C: No results for input(s): HGBA1C in the last 72 hours. CBG:  Recent Labs Lab 11/17/15 2206 11/18/15 0619 11/18/15 1117  GLUCAP 98 115* 127*   Lipid Profile: No results for input(s): CHOL, HDL, LDLCALC, TRIG, CHOLHDL, LDLDIRECT in the last 72  hours. Thyroid Function Tests:  Recent Labs  11/17/15 2135  TSH 2.162   Anemia Panel: No results for input(s): VITAMINB12, FOLATE, FERRITIN, TIBC, IRON, RETICCTPCT in the last 72 hours. Urine analysis:    Component Value Date/Time   COLORURINE AMBER (A) 04/12/2014 1624   APPEARANCEUR CLEAR 04/12/2014 1624   LABSPEC 1.025 04/12/2014 1624   PHURINE 5.0 04/12/2014 1624   GLUCOSEU NEGATIVE 04/12/2014 1624   HGBUR NEGATIVE 04/12/2014 1624   BILIRUBINUR SMALL (A) 04/12/2014 1624   KETONESUR 15 (A) 04/12/2014 1624   PROTEINUR NEGATIVE 04/12/2014 1624   UROBILINOGEN 1.0 04/12/2014 1624   NITRITE NEGATIVE 04/12/2014 1624   LEUKOCYTESUR NEGATIVE 04/12/2014 1624     )No results found for this or any previous visit (from the past 240 hour(s)).    Anti-infectives    None       Radiology Studies: Dg Chest 2 View  Result Date: 11/17/2015 CLINICAL DATA:  Chest pain.  CABG 11 years ago. EXAM: CHEST  2 VIEW COMPARISON:  02/21/2015 FINDINGS: Prior median sternotomy. Minimal pectus excavatum deformity. Moderate thoracic spondylosis. Midline trachea. Borderline cardiomegaly. Atherosclerosis in the transverse aorta. No pleural effusion or pneumothorax. Minimal volume loss in the medial right lung base. No congestive failure. IMPRESSION: No acute cardiopulmonary disease. Borderline cardiomegaly, without congestive failure. Electronically Signed   By: Abigail Miyamoto M.D.   On: 11/17/2015 18:53   Ct Angio Chest Pe W And/or Wo Contrast  Result Date: 11/17/2015 CLINICAL DATA:  Chest pain, shortness of breath, and bilateral leg swelling. Previous history of pulmonary embolus. Diabetes. EXAM: CT ANGIOGRAPHY CHEST WITH CONTRAST TECHNIQUE: Multidetector CT imaging of the chest was performed using the standard protocol during bolus administration of intravenous contrast. Multiplanar CT image reconstructions and MIPs were obtained to evaluate the vascular anatomy. CONTRAST:  100 mL Isovue 370 COMPARISON:   02/29/2012 FINDINGS: Cardiovascular: Technically adequate study with good opacification of the central and segmental pulmonary arteries. No focal filling defects. No evidence of significant pulmonary embolus. Mild cardiac enlargement. Thoracic aorta is not dilated. Mediastinum/Nodes: Postoperative changes consistent with sternotomy and coronary artery bypass. No significant lymphadenopathy. Esophagus is decompressed. Lungs/Pleura: Evaluation is limited due to respiratory motion artifact. Emphysematous changes in the lungs. Atelectasis in the lung bases. Mild mosaic attenuation pattern may be due to air trapping or edema. Vague focal nodule demonstrated in the superior segment right lower lobe measuring 5 mm diameter. This was present on the previous study without significant change. No pleural effusion. No pneumothorax. Upper Abdomen: No acute abnormality. Musculoskeletal: Degenerative changes in the spine. No destructive bone lesions. Review of the MIP images confirms the above findings. IMPRESSION: No evidence of significant pulmonary embolus. Cardiac enlargement with possible edema. No focal consolidation. Mild emphysematous changes. Electronically Signed   By: Lucienne Capers M.D.   On: 11/17/2015 21:37        Scheduled Meds: . aspirin EC  81 mg  Oral Daily  . brimonidine  1 drop Both Eyes BID   And  . timolol  1 drop Both Eyes BID  . dorzolamide  1 drop Both Eyes BID  . enoxaparin (LOVENOX) injection  40 mg Subcutaneous Q24H  . insulin aspart  0-15 Units Subcutaneous TID WC  . isosorbide mononitrate  60 mg Oral QHS  . lisinopril  5 mg Oral Daily  . sertraline  75 mg Oral Daily  . simvastatin  40 mg Oral QHS  . ticagrelor  90 mg Oral BID   Continuous Infusions:   LOS: 0 days    Time spent: 25 min    [place], DO [facility] Pager [phone]  If 7PM-7AM, please contact night-coverage [email or web address] Password [email or web address] 11/18/2015, 12:23 PM

## 2015-11-19 ENCOUNTER — Observation Stay (HOSPITAL_COMMUNITY): Payer: Medicare Other

## 2015-11-19 ENCOUNTER — Encounter (HOSPITAL_COMMUNITY)
Admission: EM | Disposition: A | Discharge status: Home / Self Care | Payer: Self-pay | Source: Home / Self Care | Attending: Internal Medicine

## 2015-11-19 DIAGNOSIS — R079 Chest pain, unspecified: Secondary | ICD-10-CM | POA: Diagnosis not present

## 2015-11-19 DIAGNOSIS — F419 Anxiety disorder, unspecified: Secondary | ICD-10-CM | POA: Diagnosis present

## 2015-11-19 DIAGNOSIS — Z9012 Acquired absence of left breast and nipple: Secondary | ICD-10-CM | POA: Diagnosis not present

## 2015-11-19 DIAGNOSIS — I872 Venous insufficiency (chronic) (peripheral): Secondary | ICD-10-CM | POA: Diagnosis present

## 2015-11-19 DIAGNOSIS — Z87891 Personal history of nicotine dependence: Secondary | ICD-10-CM | POA: Diagnosis not present

## 2015-11-19 DIAGNOSIS — E1051 Type 1 diabetes mellitus with diabetic peripheral angiopathy without gangrene: Secondary | ICD-10-CM | POA: Diagnosis not present

## 2015-11-19 DIAGNOSIS — I110 Hypertensive heart disease with heart failure: Secondary | ICD-10-CM | POA: Diagnosis present

## 2015-11-19 DIAGNOSIS — Z7984 Long term (current) use of oral hypoglycemic drugs: Secondary | ICD-10-CM | POA: Diagnosis not present

## 2015-11-19 DIAGNOSIS — E7800 Pure hypercholesterolemia, unspecified: Secondary | ICD-10-CM | POA: Diagnosis not present

## 2015-11-19 DIAGNOSIS — M79662 Pain in left lower leg: Secondary | ICD-10-CM | POA: Diagnosis not present

## 2015-11-19 DIAGNOSIS — I200 Unstable angina: Secondary | ICD-10-CM | POA: Diagnosis present

## 2015-11-19 DIAGNOSIS — E785 Hyperlipidemia, unspecified: Secondary | ICD-10-CM | POA: Diagnosis not present

## 2015-11-19 DIAGNOSIS — G4733 Obstructive sleep apnea (adult) (pediatric): Secondary | ICD-10-CM | POA: Diagnosis not present

## 2015-11-19 DIAGNOSIS — I10 Essential (primary) hypertension: Secondary | ICD-10-CM | POA: Diagnosis not present

## 2015-11-19 DIAGNOSIS — I25110 Atherosclerotic heart disease of native coronary artery with unstable angina pectoris: Secondary | ICD-10-CM | POA: Diagnosis not present

## 2015-11-19 DIAGNOSIS — I25708 Atherosclerosis of coronary artery bypass graft(s), unspecified, with other forms of angina pectoris: Secondary | ICD-10-CM | POA: Diagnosis not present

## 2015-11-19 DIAGNOSIS — Z955 Presence of coronary angioplasty implant and graft: Secondary | ICD-10-CM | POA: Diagnosis not present

## 2015-11-19 DIAGNOSIS — R0789 Other chest pain: Secondary | ICD-10-CM | POA: Diagnosis not present

## 2015-11-19 DIAGNOSIS — Z9989 Dependence on other enabling machines and devices: Secondary | ICD-10-CM | POA: Diagnosis not present

## 2015-11-19 DIAGNOSIS — Z86711 Personal history of pulmonary embolism: Secondary | ICD-10-CM | POA: Diagnosis not present

## 2015-11-19 DIAGNOSIS — R001 Bradycardia, unspecified: Secondary | ICD-10-CM | POA: Diagnosis present

## 2015-11-19 DIAGNOSIS — M7989 Other specified soft tissue disorders: Secondary | ICD-10-CM | POA: Diagnosis present

## 2015-11-19 DIAGNOSIS — Z888 Allergy status to other drugs, medicaments and biological substances status: Secondary | ICD-10-CM | POA: Diagnosis not present

## 2015-11-19 DIAGNOSIS — K210 Gastro-esophageal reflux disease with esophagitis: Secondary | ICD-10-CM | POA: Diagnosis present

## 2015-11-19 DIAGNOSIS — I5032 Chronic diastolic (congestive) heart failure: Secondary | ICD-10-CM | POA: Diagnosis present

## 2015-11-19 DIAGNOSIS — Z951 Presence of aortocoronary bypass graft: Secondary | ICD-10-CM | POA: Diagnosis not present

## 2015-11-19 DIAGNOSIS — Z79899 Other long term (current) drug therapy: Secondary | ICD-10-CM | POA: Diagnosis not present

## 2015-11-19 DIAGNOSIS — E1065 Type 1 diabetes mellitus with hyperglycemia: Secondary | ICD-10-CM | POA: Diagnosis not present

## 2015-11-19 DIAGNOSIS — F329 Major depressive disorder, single episode, unspecified: Secondary | ICD-10-CM | POA: Diagnosis present

## 2015-11-19 DIAGNOSIS — Z7982 Long term (current) use of aspirin: Secondary | ICD-10-CM | POA: Diagnosis not present

## 2015-11-19 HISTORY — PX: CARDIAC CATHETERIZATION: SHX172

## 2015-11-19 LAB — GLUCOSE, CAPILLARY
GLUCOSE-CAPILLARY: 139 mg/dL — AB (ref 65–99)
GLUCOSE-CAPILLARY: 152 mg/dL — AB (ref 65–99)
Glucose-Capillary: 121 mg/dL — ABNORMAL HIGH (ref 65–99)
Glucose-Capillary: 82 mg/dL (ref 65–99)
Glucose-Capillary: 84 mg/dL (ref 65–99)

## 2015-11-19 LAB — CBC
HEMATOCRIT: 36 % (ref 36.0–46.0)
Hemoglobin: 11.9 g/dL — ABNORMAL LOW (ref 12.0–15.0)
MCH: 30.3 pg (ref 26.0–34.0)
MCHC: 33.1 g/dL (ref 30.0–36.0)
MCV: 91.6 fL (ref 78.0–100.0)
PLATELETS: 263 10*3/uL (ref 150–400)
RBC: 3.93 MIL/uL (ref 3.87–5.11)
RDW: 12.4 % (ref 11.5–15.5)
WBC: 7 10*3/uL (ref 4.0–10.5)

## 2015-11-19 LAB — BASIC METABOLIC PANEL
ANION GAP: 10 (ref 5–15)
BUN: 18 mg/dL (ref 6–20)
CALCIUM: 9.3 mg/dL (ref 8.9–10.3)
CO2: 25 mmol/L (ref 22–32)
CREATININE: 1.06 mg/dL — AB (ref 0.44–1.00)
Chloride: 107 mmol/L (ref 101–111)
GFR, EST NON AFRICAN AMERICAN: 53 mL/min — AB (ref >60)
GLUCOSE: 137 mg/dL — AB (ref 65–99)
Potassium: 4.3 mmol/L (ref 3.5–5.1)
Sodium: 142 mmol/L (ref 135–145)

## 2015-11-19 LAB — POCT ACTIVATED CLOTTING TIME
ACTIVATED CLOTTING TIME: 279 s
Activated Clotting Time: 252 seconds

## 2015-11-19 SURGERY — LEFT HEART CATH AND CORS/GRAFTS ANGIOGRAPHY
Anesthesia: LOCAL

## 2015-11-19 MED ORDER — HEPARIN (PORCINE) IN NACL 2-0.9 UNIT/ML-% IJ SOLN
INTRAMUSCULAR | Status: AC
  Filled 2015-11-19: qty 1000

## 2015-11-19 MED ORDER — SODIUM CHLORIDE 0.9% FLUSH: 3.0000 mL | INTRAVENOUS | Status: DC | PRN

## 2015-11-19 MED ORDER — SODIUM CHLORIDE 0.9 % WEIGHT BASED INFUSION
1.0000 mL/kg/h | INTRAVENOUS | Status: AC
  Administered 2015-11-19: 20:00:00 73.9 mL/h via INTRAVENOUS

## 2015-11-19 MED ORDER — LIDOCAINE HCL (PF) 1 % IJ SOLN
INTRAMUSCULAR | Status: DC | PRN
  Administered 2015-11-19: 2 mL via INTRADERMAL

## 2015-11-19 MED ORDER — SODIUM CHLORIDE 0.9% FLUSH
3.0000 mL | Freq: Two times a day (BID) | INTRAVENOUS | Status: DC
  Administered 2015-11-20: 10:00:00 3 mL via INTRAVENOUS

## 2015-11-19 MED ORDER — HEPARIN SODIUM (PORCINE) 1000 UNIT/ML IJ SOLN
INTRAMUSCULAR | Status: AC
  Filled 2015-11-19: qty 1

## 2015-11-19 MED ORDER — SODIUM CHLORIDE 0.9 % IV SOLN: 250.0000 mL | INTRAVENOUS | Status: DC | PRN

## 2015-11-19 MED ORDER — HEPARIN (PORCINE) IN NACL 2-0.9 UNIT/ML-% IJ SOLN
INTRAMUSCULAR | Status: DC | PRN
  Administered 2015-11-19 (×2): 10 mL via INTRA_ARTERIAL

## 2015-11-19 MED ORDER — VERAPAMIL HCL 2.5 MG/ML IV SOLN
INTRAVENOUS | Status: AC
  Filled 2015-11-19: qty 2

## 2015-11-19 MED ORDER — ASPIRIN 81 MG PO CHEW: 81.0000 mg | CHEWABLE_TABLET | ORAL | Status: DC

## 2015-11-19 MED ORDER — MIDAZOLAM HCL 2 MG/2ML IJ SOLN
INTRAMUSCULAR | Status: AC
  Filled 2015-11-19: qty 2

## 2015-11-19 MED ORDER — FENTANYL CITRATE (PF) 100 MCG/2ML IJ SOLN
INTRAMUSCULAR | Status: DC | PRN
  Administered 2015-11-19 (×2): 25 ug via INTRAVENOUS

## 2015-11-19 MED ORDER — NITROGLYCERIN 1 MG/10 ML FOR IR/CATH LAB
INTRA_ARTERIAL | Status: DC | PRN
  Administered 2015-11-19: 200 ug via INTRACORONARY
  Administered 2015-11-19: 200 ug via INTRA_ARTERIAL

## 2015-11-19 MED ORDER — HEPARIN SODIUM (PORCINE) 1000 UNIT/ML IJ SOLN
INTRAMUSCULAR | Status: DC | PRN
  Administered 2015-11-19: 7000 [IU] via INTRAVENOUS
  Administered 2015-11-19: 2000 [IU] via INTRAVENOUS

## 2015-11-19 MED ORDER — SODIUM CHLORIDE 0.9% FLUSH
3.0000 mL | Freq: Two times a day (BID) | INTRAVENOUS | Status: DC
  Administered 2015-11-19 (×2): 3 mL via INTRAVENOUS

## 2015-11-19 MED ORDER — SODIUM CHLORIDE 0.9 % WEIGHT BASED INFUSION: 3.0000 mL/kg/h | INTRAVENOUS | Status: DC

## 2015-11-19 MED ORDER — IOPAMIDOL (ISOVUE-370) INJECTION 76%
INTRAVENOUS | Status: AC
  Filled 2015-11-19: qty 100

## 2015-11-19 MED ORDER — HEPARIN (PORCINE) IN NACL 2-0.9 UNIT/ML-% IJ SOLN
INTRAMUSCULAR | Status: DC | PRN
  Administered 2015-11-19: 1000 mL

## 2015-11-19 MED ORDER — MIDAZOLAM HCL 2 MG/2ML IJ SOLN
INTRAMUSCULAR | Status: DC | PRN
  Administered 2015-11-19: 1 mg via INTRAVENOUS
  Administered 2015-11-19: 2 mg via INTRAVENOUS
  Administered 2015-11-19: 1 mg via INTRAVENOUS

## 2015-11-19 MED ORDER — SODIUM CHLORIDE 0.9 % WEIGHT BASED INFUSION: 1.0000 mL/kg/h | INTRAVENOUS | Status: DC

## 2015-11-19 MED ORDER — OXYCODONE HCL 5 MG PO TABS
5.0000 mg | ORAL_TABLET | Freq: Once | ORAL | Status: AC
Start: 2015-11-19 — End: 2015-11-19
  Administered 2015-11-19: 22:00:00 5 mg via ORAL
  Filled 2015-11-19: qty 1

## 2015-11-19 MED ORDER — FENTANYL CITRATE (PF) 100 MCG/2ML IJ SOLN
INTRAMUSCULAR | Status: AC
  Filled 2015-11-19: qty 2

## 2015-11-19 MED ORDER — LIDOCAINE HCL (PF) 1 % IJ SOLN
INTRAMUSCULAR | Status: AC
  Filled 2015-11-19: qty 30

## 2015-11-19 MED ORDER — IOPAMIDOL (ISOVUE-370) INJECTION 76%
INTRAVENOUS | Status: DC | PRN
  Administered 2015-11-19: 150 mL via INTRA_ARTERIAL

## 2015-11-19 SURGICAL SUPPLY — 20 items
BALLN EUPHORA RX 2.5X12 (BALLOONS) ×2
BALLN ~~LOC~~ MOZEC 3.0X13 (BALLOONS) ×2
BALLOON EUPHORA RX 2.5X12 (BALLOONS) IMPLANT
BALLOON ~~LOC~~ MOZEC 3.0X13 (BALLOONS) IMPLANT
CATH 5FR JL3.5 JR4 ANG PIG MP (CATHETERS) ×1 IMPLANT
CATH INFINITI 4FR 145 PIGTAIL (CATHETERS) ×1 IMPLANT
CATH INFINITI 4FR JL3.5 (CATHETERS) ×1 IMPLANT
CATH LAUNCHER 5F JR4 (CATHETERS) ×1 IMPLANT
DEVICE RAD COMP TR BAND LRG (VASCULAR PRODUCTS) ×1 IMPLANT
GLIDESHEATH SLEND SS 6F .021 (SHEATH) ×1 IMPLANT
KIT ENCORE 26 ADVANTAGE (KITS) ×1 IMPLANT
KIT HEART LEFT (KITS) ×2 IMPLANT
PACK CARDIAC CATHETERIZATION (CUSTOM PROCEDURE TRAY) ×2 IMPLANT
STENT SYNERGY DES 2.75X16 (Permanent Stent) ×1 IMPLANT
SYR MEDRAD MARK V 150ML (SYRINGE) ×2 IMPLANT
TRANSDUCER W/STOPCOCK (MISCELLANEOUS) ×2 IMPLANT
TUBING CIL FLEX 10 FLL-RA (TUBING) ×2 IMPLANT
WIRE ASAHI PROWATER 180CM (WIRE) ×1 IMPLANT
WIRE EMERALD 3MM-J .035X260CM (WIRE) ×1 IMPLANT
WIRE HI TORQ VERSACORE-J 145CM (WIRE) ×1 IMPLANT

## 2015-11-19 NOTE — Care Management Obs Status (Signed)
[facility] NOTIFICATION   Patient Details  Name: ANEYA CARNERO MRN: [id number] Date of Birth: Dec 01, 1945   Medicare Observation Status Notification Given:  Yes    Erenest Rasher, RN 11/19/2015, 4:01 PM

## 2015-11-19 NOTE — Progress Notes (Signed)
Hospital Problem List     Principal Problem:   Chest pain Active Problems:   DM (diabetes mellitus) type I uncontrolled, periph vascular disorder (HCC)   Essential hypertension   CAD, ARTERY BYPASS GRAFT   Pain and swelling of left lower leg     Patient Profile:   Taylor [name] is a 70 year old female with a past medical history significant for CAD s/p CABG in 05/2004, PAD, PE 02/2012 with unclear etiology (not on any anticoagulation), History of Breast Cancer s/p L mastectomy in 2011, HTN, DM II, and former smoker. Mother and father with MIs, sister with CABG at age 81.  Subjective   Continues to have chest tightness at rest. No shortness of breath. No other complaints.   Inpatient Medications    . [START ON 11/20/2015] aspirin  81 mg Oral Pre-Cath  . aspirin EC  81 mg Oral Daily  . brimonidine  1 drop Both Eyes BID   And  . timolol  1 drop Both Eyes BID  . dorzolamide  1 drop Both Eyes BID  . insulin aspart  0-15 Units Subcutaneous TID WC  . isosorbide mononitrate  60 mg Oral QHS  . lisinopril  5 mg Oral Daily  . rosuvastatin  20 mg Oral q1800  . sertraline  75 mg Oral Daily  . sodium chloride flush  3 mL Intravenous Q12H  . ticagrelor  90 mg Oral BID    Vital Signs    Vitals:   11/17/15 2133 11/18/15 1351 11/18/15 2040 11/19/15 0640  BP: (!) 142/56 (!) 137/47 (!) 142/56 (!) 134/53  Pulse: (!) 46 (!) 52 97 (!) 53  Resp: 16 18 16 18   Temp: 97.6 F (36.4 C) 98 F (36.7 C) 97.7 F (36.5 C) 97.9 F (36.6 C)  TempSrc: Oral Oral Oral Oral  SpO2: 99% 98% 98% 99%  Weight: 163 lb 2.3 oz (74 kg)   162 lb 14.7 oz (73.9 kg)  Height:        Intake/Output Summary (Last 24 hours) at 11/19/15 1031 Last data filed at 11/18/15 2100  Gross per 24 hour  Intake              900 ml  Output                0 ml  Net              900 ml   Filed Weights   11/17/15 1803 11/17/15 2133 11/19/15 0640  Weight: 164 lb 11.2 oz (74.7 kg) 163 lb 2.3 oz (74 kg) 162 lb 14.7 oz (73.9 kg)     Physical Exam    General: Pleasant, NAD Psych: Normal affect. Neuro: Alert and oriented X 3. Moves all extremities spontaneously. HEENT: Normal           Neck: Supple without bruits or JVD. Lungs:  Resp regular and unlabored, CTA. Heart: RRR no s3, s4, or murmurs. Abdomen: Soft, non-tender, non-distended, BS + x 4.  Extremities: No clubbing, cyanosis or L>R LE edema. DP/PT/Radials 2+ and equal bilaterally.  Labs    CBC  Recent Labs  11/17/15 1802 11/19/15 0347  WBC 8.4 7.0  HGB 12.6 11.9*  HCT 37.7 36.0  MCV 91.5 91.6  PLT 310 99991111   Basic Metabolic Panel  Recent Labs  11/17/15 1802 11/19/15 0347  NA 139 142  K 3.9 4.3  CL 105 107  CO2 25 25  GLUCOSE 133* 137*  BUN 14 18  CREATININE 1.05* 1.06*  CALCIUM 10.1 9.3   Liver Function Tests No results for input(s): AST, ALT, ALKPHOS, BILITOT, PROT, ALBUMIN in the last 72 hours. No results for input(s): LIPASE, AMYLASE in the last 72 hours. Cardiac Enzymes  Recent Labs  11/17/15 2135 11/18/15 0025 11/18/15 0327  TROPONINI <0.03 <0.03 <0.03   BNP Invalid input(s): POCBNP D-Dimer No results for input(s): DDIMER in the last 72 hours. Hemoglobin A1C No results for input(s): HGBA1C in the last 72 hours. Fasting Lipid Panel No results for input(s): CHOL, HDL, LDLCALC, TRIG, CHOLHDL, LDLDIRECT in the last 72 hours. Thyroid Function Tests  Recent Labs  11/17/15 2135  TSH 2.162    Telemetry    Sinus bradycarida  ECG    11/18/15 - Sinus Bradycardia, incomplete RBBB   Cardiac Studies and Radiology    Dg Chest 2 View  Result Date: 11/17/2015 CLINICAL DATA:  Chest pain.  CABG 11 years ago. EXAM: CHEST  2 VIEW COMPARISON:  02/21/2015 FINDINGS: Prior median sternotomy. Minimal pectus excavatum deformity. Moderate thoracic spondylosis. Midline trachea. Borderline cardiomegaly. Atherosclerosis in the transverse aorta. No pleural effusion or pneumothorax. Minimal volume loss in the medial right lung base.  No congestive failure. IMPRESSION: No acute cardiopulmonary disease. Borderline cardiomegaly, without congestive failure. Electronically Signed   By: Abigail Miyamoto M.D.   On: 11/17/2015 18:53   Ct Angio Chest Pe W And/or Wo Contrast  Result Date: 11/17/2015 CLINICAL DATA:  Chest pain, shortness of breath, and bilateral leg swelling. Previous history of pulmonary embolus. Diabetes. EXAM: CT ANGIOGRAPHY CHEST WITH CONTRAST TECHNIQUE: Multidetector CT imaging of the chest was performed using the standard protocol during bolus administration of intravenous contrast. Multiplanar CT image reconstructions and MIPs were obtained to evaluate the vascular anatomy. CONTRAST:  100 mL Isovue 370 COMPARISON:  02/29/2012 FINDINGS: Cardiovascular: Technically adequate study with good opacification of the central and segmental pulmonary arteries. No focal filling defects. No evidence of significant pulmonary embolus. Mild cardiac enlargement. Thoracic aorta is not dilated. Mediastinum/Nodes: Postoperative changes consistent with sternotomy and coronary artery bypass. No significant lymphadenopathy. Esophagus is decompressed. Lungs/Pleura: Evaluation is limited due to respiratory motion artifact. Emphysematous changes in the lungs. Atelectasis in the lung bases. Mild mosaic attenuation pattern may be due to air trapping or edema. Vague focal nodule demonstrated in the superior segment right lower lobe measuring 5 mm diameter. This was present on the previous study without significant change. No pleural effusion. No pneumothorax. Upper Abdomen: No acute abnormality. Musculoskeletal: Degenerative changes in the spine. No destructive bone lesions. Review of the MIP images confirms the above findings. IMPRESSION: No evidence of significant pulmonary embolus. Cardiac enlargement with possible edema. No focal consolidation. Mild emphysematous changes. Electronically Signed   By: Lucienne Capers M.D.   On: 11/17/2015 21:37    Assessment & Plan    Unstable Angina with CAD s/p CABG: Troponin negative x3. No acute EKG changes. Continues to have angina at rest, worse with exertion. Will precede with LHC this afternoon.   HLD: Changed her simvastatin to Crestor 20 mg daily yesterday. Intolerant to atorvastatin. Will likely need addition of zetia or repatha if she continues to have progression of her CAD.   Bradycardia: HR in the 40-50s. Holding her BB. TSH wnl.   PAD: Known severe PAD. Stable claudication. Follows with Dr [name]. She is not on cilostazol given DAPT.No rest pain or pedal ulcerations.   Hx of PE in 2/14: CTA negative on admission. Dopplers pending. On initial hypercoagulable workup, lupus anticoagulant  was positive, raising concern for antiphospholipid antibody syndrome. However, repeat antiphospholipid antibody panels in 8/14 and 1/15 were negative and her oncologist let her stop warfarin.   LLE pain and swelling: reports it is chronic. Doppler prelim shows no evidence of DVT.   HTN: BP well controlled.  DM: per primary   Signed, Maryellen Pile PGY2 IM Resident [phone] 10:31 AM 11/19/2015  Attending Note:   The patient was seen and examined.  Agree with assessment and plan as noted above.  Changes made to the above note as needed.  Patient seen and independently examined with Maryellen Pile, PGY2 .   We discussed all aspects of the encounter. I agree with the assessment and plan as stated above.  Pt has significant CAD. Presented with ongoing chest pressure  myoview would be difficult to interpret. Will cath today    I have spent a total of 40 minutes with patient reviewing hospital  notes , telemetry, EKGs, labs and examining patient as well as establishing an assessment and plan that was discussed with the patient. > 50% of time was spent in direct patient care.   Thayer Headings, Brooke Bonito., MD, [facility] 11/19/2015, 11:04 AM [address],  [place] Pager [phone]

## 2015-11-19 NOTE — Progress Notes (Signed)
PROGRESS NOTE    Taylor [name]  [id number] DOB: Jun 07, 1945 DOA: 11/17/2015 PCP: Melinda Crutch, MD   Outpatient Specialists:    Brief Narrative:  Taylor [name] is a 70 y.o. woman with a history of CAD S/P CABG, DES to RCA earlier this year with repeat cath in June for recurrent chest pain, PAD, HTN, Hypercholesterolemia, Breast Cancer, and prior PE (has been off of warfarin for two years now) who presents to the ED for evaluation of chest pain.  The patient complains of a left sided chest pain described as a "pulling sensation" or pressure that radiates to her left shoulder and jaw.  It has been associated with nausea and light-headedness.  It occurs at rest and with exertion.  It is worse with exertion.  No significant shortness of breath.    Assessment & Plan:   Principal Problem:   Chest pain Active Problems:   DM (diabetes mellitus) type I uncontrolled, periph vascular disorder (HCC)   Essential hypertension   CAD, ARTERY BYPASS GRAFT   Pain and swelling of left lower leg   Chest pain, known history of CAD, prior CABG, recent stent --troponin negative --Echo still not done -- cath planned for 4:30 PM 11/7 --Cardiology consulted --CTA chest negative for PE --Continue baby aspirin and brilinta for now --Holding beta blocker for bradycardia. -increased imdur  Bradycardia --Hold BB -- TSH ok --Telemetry monitoring  Left leg pain and swelling --Venous ultrasound negative  DM --HOLD metformin --SSI average scale AC  Anxiety/depression --Continue Zoloft --Xanax prn  OSA --CPAP at home   DVT prophylaxis:  Lovenox   Code Status: Full Code   Family Communication: patient  Disposition Plan:  Home after cath?   Consultants:   cardiology     Subjective: Woke up with headache Not wearing CPAP in hospital  Objective: Vitals:   11/17/15 2133 11/18/15 1351 11/18/15 2040 11/19/15 0640  BP: (!) 142/56 (!) 137/47 (!) 142/56 (!) 134/53  Pulse:  (!) 46 (!) 52 97 (!) 53  Resp: 16 18 16 18   Temp: 97.6 F (36.4 C) 98 F (36.7 C) 97.7 F (36.5 C) 97.9 F (36.6 C)  TempSrc: Oral Oral Oral Oral  SpO2: 99% 98% 98% 99%  Weight: 74 kg (163 lb 2.3 oz)   73.9 kg (162 lb 14.7 oz)  Height:        Intake/Output Summary (Last 24 hours) at 11/19/15 1045 Last data filed at 11/19/15 0800  Gross per 24 hour  Intake             1380 ml  Output                0 ml  Net             1380 ml   Filed Weights   11/17/15 1803 11/17/15 2133 11/19/15 0640  Weight: 74.7 kg (164 lb 11.2 oz) 74 kg (163 lb 2.3 oz) 73.9 kg (162 lb 14.7 oz)    Examination:  General exam: Appears calm and comfortable  Respiratory system: Clear to auscultation. Respiratory effort normal. Cardiovascular system: S1 & S2 heard, RRR. No JVD, murmurs, rubs, gallops or clicks. No pedal edema. Gastrointestinal system: Abdomen is nondistended, soft and nontender. No organomegaly or masses felt. Normal bowel sounds heard. Central nervous system: Alert and oriented. No focal neurological deficits.     Data Reviewed: I have personally reviewed following labs and imaging studies  CBC:  Recent Labs Lab 11/17/15 1802 11/19/15 0347  WBC  8.4 7.0  HGB 12.6 11.9*  HCT 37.7 36.0  MCV 91.5 91.6  PLT 310 99991111   Basic Metabolic Panel:  Recent Labs Lab 11/17/15 1802 11/19/15 0347  NA 139 142  K 3.9 4.3  CL 105 107  CO2 25 25  GLUCOSE 133* 137*  BUN 14 18  CREATININE 1.05* 1.06*  CALCIUM 10.1 9.3   GFR: Estimated Creatinine Clearance: 49.4 mL/min (by C-G formula based on SCr of 1.06 mg/dL (H)). Liver Function Tests: No results for input(s): AST, ALT, ALKPHOS, BILITOT, PROT, ALBUMIN in the last 168 hours. No results for input(s): LIPASE, AMYLASE in the last 168 hours. No results for input(s): AMMONIA in the last 168 hours. Coagulation Profile:  Recent Labs Lab 11/18/15 2040  INR 0.96   Cardiac Enzymes:  Recent Labs Lab 11/17/15 2135 11/18/15 0025  11/18/15 0327  TROPONINI <0.03 <0.03 <0.03   BNP (last 3 results) No results for input(s): PROBNP in the last 8760 hours. HbA1C: No results for input(s): HGBA1C in the last 72 hours. CBG:  Recent Labs Lab 11/18/15 0619 11/18/15 1117 11/18/15 1710 11/18/15 2136 11/19/15 0636  GLUCAP 115* 127* 174* 131* 139*   Lipid Profile: No results for input(s): CHOL, HDL, LDLCALC, TRIG, CHOLHDL, LDLDIRECT in the last 72 hours. Thyroid Function Tests:  Recent Labs  11/17/15 2135  TSH 2.162   Anemia Panel: No results for input(s): VITAMINB12, FOLATE, FERRITIN, TIBC, IRON, RETICCTPCT in the last 72 hours. Urine analysis:    Component Value Date/Time   COLORURINE AMBER (A) 04/12/2014 1624   APPEARANCEUR CLEAR 04/12/2014 1624   LABSPEC 1.025 04/12/2014 1624   PHURINE 5.0 04/12/2014 1624   GLUCOSEU NEGATIVE 04/12/2014 1624   HGBUR NEGATIVE 04/12/2014 1624   BILIRUBINUR SMALL (A) 04/12/2014 1624   KETONESUR 15 (A) 04/12/2014 1624   PROTEINUR NEGATIVE 04/12/2014 1624   UROBILINOGEN 1.0 04/12/2014 1624   NITRITE NEGATIVE 04/12/2014 1624   LEUKOCYTESUR NEGATIVE 04/12/2014 1624     )No results found for this or any previous visit (from the past 240 hour(s)).    Anti-infectives    None       Radiology Studies: Dg Chest 2 View  Result Date: 11/17/2015 CLINICAL DATA:  Chest pain.  CABG 11 years ago. EXAM: CHEST  2 VIEW COMPARISON:  02/21/2015 FINDINGS: Prior median sternotomy. Minimal pectus excavatum deformity. Moderate thoracic spondylosis. Midline trachea. Borderline cardiomegaly. Atherosclerosis in the transverse aorta. No pleural effusion or pneumothorax. Minimal volume loss in the medial right lung base. No congestive failure. IMPRESSION: No acute cardiopulmonary disease. Borderline cardiomegaly, without congestive failure. Electronically Signed   By: Abigail Miyamoto M.D.   On: 11/17/2015 18:53   Ct Angio Chest Pe W And/or Wo Contrast  Result Date: 11/17/2015 CLINICAL DATA:   Chest pain, shortness of breath, and bilateral leg swelling. Previous history of pulmonary embolus. Diabetes. EXAM: CT ANGIOGRAPHY CHEST WITH CONTRAST TECHNIQUE: Multidetector CT imaging of the chest was performed using the standard protocol during bolus administration of intravenous contrast. Multiplanar CT image reconstructions and MIPs were obtained to evaluate the vascular anatomy. CONTRAST:  100 mL Isovue 370 COMPARISON:  02/29/2012 FINDINGS: Cardiovascular: Technically adequate study with good opacification of the central and segmental pulmonary arteries. No focal filling defects. No evidence of significant pulmonary embolus. Mild cardiac enlargement. Thoracic aorta is not dilated. Mediastinum/Nodes: Postoperative changes consistent with sternotomy and coronary artery bypass. No significant lymphadenopathy. Esophagus is decompressed. Lungs/Pleura: Evaluation is limited due to respiratory motion artifact. Emphysematous changes in the lungs. Atelectasis in the  lung bases. Mild mosaic attenuation pattern may be due to air trapping or edema. Vague focal nodule demonstrated in the superior segment right lower lobe measuring 5 mm diameter. This was present on the previous study without significant change. No pleural effusion. No pneumothorax. Upper Abdomen: No acute abnormality. Musculoskeletal: Degenerative changes in the spine. No destructive bone lesions. Review of the MIP images confirms the above findings. IMPRESSION: No evidence of significant pulmonary embolus. Cardiac enlargement with possible edema. No focal consolidation. Mild emphysematous changes. Electronically Signed   By: Lucienne Capers M.D.   On: 11/17/2015 21:37        Scheduled Meds: . [START ON 11/20/2015] aspirin  81 mg Oral Pre-Cath  . aspirin EC  81 mg Oral Daily  . brimonidine  1 drop Both Eyes BID   And  . timolol  1 drop Both Eyes BID  . dorzolamide  1 drop Both Eyes BID  . insulin aspart  0-15 Units Subcutaneous TID WC  .  isosorbide mononitrate  60 mg Oral QHS  . lisinopril  5 mg Oral Daily  . rosuvastatin  20 mg Oral q1800  . sertraline  75 mg Oral Daily  . sodium chloride flush  3 mL Intravenous Q12H  . ticagrelor  90 mg Oral BID   Continuous Infusions: . [START ON 11/20/2015] sodium chloride     Followed by  . [START ON 11/20/2015] sodium chloride       LOS: 0 days    Time spent: 25 min    [place], DO [facility] Pager [phone]  If 7PM-7AM, please contact night-coverage [email or web address] Password [email or web address] 11/19/2015, 10:45 AM

## 2015-11-19 NOTE — Interval H&P Note (Signed)
History and Physical Interval Note:  11/19/2015 4:25 PM  Morine D Kubiak  has presented today for surgery, with the diagnosis of CAD  The various methods of treatment have been discussed with the patient and family. After consideration of risks, benefits and other options for treatment, the patient has consented to  Procedure(s): Left Heart Cath and Cors/Grafts Angiography (N/A) as a surgical intervention .  The patient's history has been reviewed, patient examined, no change in status, stable for surgery.  I have reviewed the patient's chart and labs.  Questions were answered to the patient's satisfaction.   Cath Lab Visit (complete for each Cath Lab visit)  Clinical Evaluation Leading to the Procedure:   ACS: Yes.    Non-ACS:    Anginal Classification: CCS III  Anti-ischemic medical therapy: Maximal Therapy (2 or more classes of medications)  Non-Invasive Test Results: No non-invasive testing performed  Prior CABG: Previous CABG        Collier Salina [facility] 11/19/2015 4:25 PM

## 2015-11-19 NOTE — Care Management Note (Signed)
Case Management Note  Patient Details  Name: Taylor [name] MRN: [id number] Date of Birth: November 26, 1945  Subjective/Objective:     Chest pain, DM               Action/Plan: Discharge Planning: NCm spoke to pt and husband at bedside. Pt states she was independent prior to admission. She was taking Brilinta prior to admission with copay $66.00 per month  PCP-ROSS, Dwyane Luo MD  Expected Discharge Date:                Expected Discharge Plan:  Home/Self Care  In-House Referral:  NA  Discharge planning Services  NA  Post Acute Care Choice:  NA Choice offered to:  NA  DME Arranged:  N/A DME Agency:  NA  HH Arranged:  NA HH Agency:  NA  Status of Service:  Completed, signed off  If discussed at [place] of Stay Meetings, dates discussed:    Additional Comments:  Erenest Rasher, RN 11/19/2015, 4:01 PM

## 2015-11-19 NOTE — H&P (View-Only) (Signed)
Hospital Problem List     Principal Problem:   Chest pain Active Problems:   DM (diabetes mellitus) type I uncontrolled, periph vascular disorder (HCC)   Essential hypertension   CAD, ARTERY BYPASS GRAFT   Pain and swelling of left lower leg     Patient Profile:   Taylor [name] is a 70 year old female with a past medical history significant for CAD s/p CABG in 05/2004, PAD, PE 02/2012 with unclear etiology (not on any anticoagulation), History of Breast Cancer s/p L mastectomy in 2011, HTN, DM II, and former smoker. Mother and father with MIs, sister with CABG at age 64.  Subjective   Continues to have chest tightness at rest. No shortness of breath. No other complaints.   Inpatient Medications    . [START ON 11/20/2015] aspirin  81 mg Oral Pre-Cath  . aspirin EC  81 mg Oral Daily  . brimonidine  1 drop Both Eyes BID   And  . timolol  1 drop Both Eyes BID  . dorzolamide  1 drop Both Eyes BID  . insulin aspart  0-15 Units Subcutaneous TID WC  . isosorbide mononitrate  60 mg Oral QHS  . lisinopril  5 mg Oral Daily  . rosuvastatin  20 mg Oral q1800  . sertraline  75 mg Oral Daily  . sodium chloride flush  3 mL Intravenous Q12H  . ticagrelor  90 mg Oral BID    Vital Signs    Vitals:   11/17/15 2133 11/18/15 1351 11/18/15 2040 11/19/15 0640  BP: (!) 142/56 (!) 137/47 (!) 142/56 (!) 134/53  Pulse: (!) 46 (!) 52 97 (!) 53  Resp: 16 18 16 18   Temp: 97.6 F (36.4 C) 98 F (36.7 C) 97.7 F (36.5 C) 97.9 F (36.6 C)  TempSrc: Oral Oral Oral Oral  SpO2: 99% 98% 98% 99%  Weight: 163 lb 2.3 oz (74 kg)   162 lb 14.7 oz (73.9 kg)  Height:        Intake/Output Summary (Last 24 hours) at 11/19/15 1031 Last data filed at 11/18/15 2100  Gross per 24 hour  Intake              900 ml  Output                0 ml  Net              900 ml   Filed Weights   11/17/15 1803 11/17/15 2133 11/19/15 0640  Weight: 164 lb 11.2 oz (74.7 kg) 163 lb 2.3 oz (74 kg) 162 lb 14.7 oz (73.9 kg)     Physical Exam    General: Pleasant, NAD Psych: Normal affect. Neuro: Alert and oriented X 3. Moves all extremities spontaneously. HEENT: Normal           Neck: Supple without bruits or JVD. Lungs:  Resp regular and unlabored, CTA. Heart: RRR no s3, s4, or murmurs. Abdomen: Soft, non-tender, non-distended, BS + x 4.  Extremities: No clubbing, cyanosis or L>R LE edema. DP/PT/Radials 2+ and equal bilaterally.  Labs    CBC  Recent Labs  11/17/15 1802 11/19/15 0347  WBC 8.4 7.0  HGB 12.6 11.9*  HCT 37.7 36.0  MCV 91.5 91.6  PLT 310 [id number]   Basic Metabolic Panel  Recent Labs  11/17/15 1802 11/19/15 0347  NA 139 142  K 3.9 4.3  CL 105 107  CO2 25 25  GLUCOSE 133* 137*  BUN 14 18  CREATININE 1.05* 1.06*  CALCIUM 10.1 9.3   Liver Function Tests No results for input(s): AST, ALT, ALKPHOS, BILITOT, PROT, ALBUMIN in the last 72 hours. No results for input(s): LIPASE, AMYLASE in the last 72 hours. Cardiac Enzymes  Recent Labs  11/17/15 2135 11/18/15 0025 11/18/15 0327  TROPONINI <0.03 <0.03 <0.03   BNP Invalid input(s): POCBNP D-Dimer No results for input(s): DDIMER in the last 72 hours. Hemoglobin A1C No results for input(s): HGBA1C in the last 72 hours. Fasting Lipid Panel No results for input(s): CHOL, HDL, LDLCALC, TRIG, CHOLHDL, LDLDIRECT in the last 72 hours. Thyroid Function Tests  Recent Labs  11/17/15 2135  TSH 2.162    Telemetry    Sinus bradycarida  ECG    11/18/15 - Sinus Bradycardia, incomplete RBBB   Cardiac Studies and Radiology    Dg Chest 2 View  Result Date: 11/17/2015 CLINICAL DATA:  Chest pain.  CABG 11 years ago. EXAM: CHEST  2 VIEW COMPARISON:  02/21/2015 FINDINGS: Prior median sternotomy. Minimal pectus excavatum deformity. Moderate thoracic spondylosis. Midline trachea. Borderline cardiomegaly. Atherosclerosis in the transverse aorta. No pleural effusion or pneumothorax. Minimal volume loss in the medial right lung base.  No congestive failure. IMPRESSION: No acute cardiopulmonary disease. Borderline cardiomegaly, without congestive failure. Electronically Signed   By: Abigail Miyamoto M.D.   On: 11/17/2015 18:53   Ct Angio Chest Pe W And/or Wo Contrast  Result Date: 11/17/2015 CLINICAL DATA:  Chest pain, shortness of breath, and bilateral leg swelling. Previous history of pulmonary embolus. Diabetes. EXAM: CT ANGIOGRAPHY CHEST WITH CONTRAST TECHNIQUE: Multidetector CT imaging of the chest was performed using the standard protocol during bolus administration of intravenous contrast. Multiplanar CT image reconstructions and MIPs were obtained to evaluate the vascular anatomy. CONTRAST:  100 mL Isovue 370 COMPARISON:  02/29/2012 FINDINGS: Cardiovascular: Technically adequate study with good opacification of the central and segmental pulmonary arteries. No focal filling defects. No evidence of significant pulmonary embolus. Mild cardiac enlargement. Thoracic aorta is not dilated. Mediastinum/Nodes: Postoperative changes consistent with sternotomy and coronary artery bypass. No significant lymphadenopathy. Esophagus is decompressed. Lungs/Pleura: Evaluation is limited due to respiratory motion artifact. Emphysematous changes in the lungs. Atelectasis in the lung bases. Mild mosaic attenuation pattern may be due to air trapping or edema. Vague focal nodule demonstrated in the superior segment right lower lobe measuring 5 mm diameter. This was present on the previous study without significant change. No pleural effusion. No pneumothorax. Upper Abdomen: No acute abnormality. Musculoskeletal: Degenerative changes in the spine. No destructive bone lesions. Review of the MIP images confirms the above findings. IMPRESSION: No evidence of significant pulmonary embolus. Cardiac enlargement with possible edema. No focal consolidation. Mild emphysematous changes. Electronically Signed   By: Lucienne Capers M.D.   On: 11/17/2015 21:37    Assessment & Plan    Unstable Angina with CAD s/p CABG: Troponin negative x3. No acute EKG changes. Continues to have angina at rest, worse with exertion. Will precede with LHC this afternoon.   HLD: Changed her simvastatin to Crestor 20 mg daily yesterday. Intolerant to atorvastatin. Will likely need addition of zetia or repatha if she continues to have progression of her CAD.   Bradycardia: HR in the 40-50s. Holding her BB. TSH wnl.   PAD: Known severe PAD. Stable claudication. Follows with Dr [name]. She is not on cilostazol given DAPT.No rest pain or pedal ulcerations.   Hx of PE in 2/14: CTA negative on admission. Dopplers pending. On initial hypercoagulable workup, lupus anticoagulant  was positive, raising concern for antiphospholipid antibody syndrome. However, repeat antiphospholipid antibody panels in 8/14 and 1/15 were negative and her oncologist let her stop warfarin.   LLE pain and swelling: reports it is chronic. Doppler prelim shows no evidence of DVT.   HTN: BP well controlled.  DM: per primary   Signed, Maryellen Pile PGY2 IM Resident [phone] 10:31 AM 11/19/2015  Attending Note:   The patient was seen and examined.  Agree with assessment and plan as noted above.  Changes made to the above note as needed.  Patient seen and independently examined with Maryellen Pile, PGY2 .   We discussed all aspects of the encounter. I agree with the assessment and plan as stated above.  Pt has significant CAD. Presented with ongoing chest pressure  myoview would be difficult to interpret. Will cath today    I have spent a total of 40 minutes with patient reviewing hospital  notes , telemetry, EKGs, labs and examining patient as well as establishing an assessment and plan that was discussed with the patient. > 50% of time was spent in direct patient care.   Thayer Headings, Brooke Bonito., MD, [facility] 11/19/2015, 11:04 AM [address]  [place] Pager [phone]

## 2015-11-20 ENCOUNTER — Inpatient Hospital Stay (HOSPITAL_COMMUNITY): Payer: Medicare Other

## 2015-11-20 ENCOUNTER — Encounter (HOSPITAL_COMMUNITY): Payer: Self-pay | Admitting: Cardiology

## 2015-11-20 DIAGNOSIS — I200 Unstable angina: Secondary | ICD-10-CM

## 2015-11-20 LAB — BASIC METABOLIC PANEL
Anion gap: 8 (ref 5–15)
BUN: 18 mg/dL (ref 6–20)
CALCIUM: 9.1 mg/dL (ref 8.9–10.3)
CHLORIDE: 107 mmol/L (ref 101–111)
CO2: 22 mmol/L (ref 22–32)
CREATININE: 1.06 mg/dL — AB (ref 0.44–1.00)
GFR calc Af Amer: >60 mL/min (ref >60)
GFR calc non Af Amer: 53 mL/min — ABNORMAL LOW (ref >60)
Glucose, Bld: 124 mg/dL — ABNORMAL HIGH (ref 65–99)
Potassium: 4.5 mmol/L (ref 3.5–5.1)
SODIUM: 137 mmol/L (ref 135–145)

## 2015-11-20 LAB — CBC
HCT: 36.9 % (ref 36.0–46.0)
Hemoglobin: 11.9 g/dL — ABNORMAL LOW (ref 12.0–15.0)
MCH: 29.8 pg (ref 26.0–34.0)
MCHC: 32.2 g/dL (ref 30.0–36.0)
MCV: 92.5 fL (ref 78.0–100.0)
PLATELETS: 258 10*3/uL (ref 150–400)
RBC: 3.99 MIL/uL (ref 3.87–5.11)
RDW: 12.6 % (ref 11.5–15.5)
WBC: 7.9 10*3/uL (ref 4.0–10.5)

## 2015-11-20 LAB — GLUCOSE, CAPILLARY
Glucose-Capillary: 142 mg/dL — ABNORMAL HIGH (ref 65–99)
Glucose-Capillary: 137 mg/dL — ABNORMAL HIGH (ref 65–99)

## 2015-11-20 MED ORDER — ANGIOPLASTY BOOK
Freq: Once | Status: AC
  Administered 2015-11-20: 1
  Filled 2015-11-20: qty 1

## 2015-11-20 MED ORDER — NITROGLYCERIN 0.4 MG SL SUBL: 0.4000 mg | SUBLINGUAL_TABLET | SUBLINGUAL | 0 refills | Status: DC | PRN

## 2015-11-20 MED ORDER — MORPHINE SULFATE (PF) 4 MG/ML IV SOLN: 2.0000 mg | INTRAVENOUS | Status: DC | PRN

## 2015-11-20 MED ORDER — ROSUVASTATIN CALCIUM 20 MG PO TABS: 20.0000 mg | ORAL_TABLET | Freq: Every day | ORAL | 0 refills | Status: DC

## 2015-11-20 MED ORDER — ISOSORBIDE MONONITRATE ER 60 MG PO TB24: 60.0000 mg | ORAL_TABLET | Freq: Every day | ORAL | 0 refills | Status: DC

## 2015-11-20 NOTE — Discharge Summary (Signed)
Physician Discharge Summary  Taylor [name] [id number] DOB: [birth year] DOA: 11/17/2015  PCP: Melinda Crutch, MD  Admit date: 11/17/2015 Discharge date: 11/20/2015   Recommendations for Outpatient Follow-Up:   1. Continue to wear CPAP 2. Cardiac rehab   Discharge Diagnosis:   Principal Problem:   Unstable angina pectoris (HCC) Active Problems:   DM (diabetes mellitus) type I uncontrolled, periph vascular disorder (HCC)   CAD, ARTERY BYPASS GRAFT   Hypercholesteremia   Hypertensive heart disease   Pain and swelling of left lower leg   Discharge disposition:  Home  Discharge Condition: Improved.  Diet recommendation: Low sodium, heart healthy.  Carbohydrate-modified  Wound care: None.   History of Present Illness:   Taylor [name] is a 70 y.o. woman with a history of CAD S/P CABG, DES to RCA earlier this year with repeat cath in June for recurrent chest pain, PAD, HTN, Hypercholesterolemia, Breast Cancer, and prior PE (has been off of warfarin for two years now) who presents to the ED for evaluation of chest pain.  The patient complains of a left sided chest pain described as a "pulling sensation" or pressure that radiates to her left shoulder and jaw.  It has been associated with nausea and light-headedness.  It occurs at rest and with exertion.  It is worse with exertion.  No significant shortness of breath.  No LOC.  No cough.  No fever.  She has developed LLE pain and swelling in the past 24 hours.  No prolonged trips/recent travel.  She is not on OCPs/estrogen.  She feels that her pain feels more like cardiac chest pain than the pain that she recalls with PE.   Hospital Course by Problem:   Chest pain, known history of CAD, prior CABG, recent stent --troponin negative -- cath-S/p PCI/DES of the RCA  --Cardiology consult appreciated --CTA chest negative for PE --Continue baby aspirin and brilinta for now --stopped beta blocker for bradycardia. -increased  imdur  Bradycardia --Hold BB -- TSH ok --Telemetry monitoring  Left leg pain and swelling --Venous ultrasound negative  DM --HOLD metformin --SSI average scale AC  Anxiety/depression --Continue Zoloft --Xanax prn  OSA --CPAP at home  HLD -changed to crestor   Medical Consultants:    cards   Discharge Exam:   Vitals:   11/20/15 0600 11/20/15 0743  BP: (!) 159/62 (!) 100/53  Pulse: (!) 56 (!) 56  Resp: (!) 26 14  Temp:  98 F (36.7 C)   Vitals:   11/20/15 0200 11/20/15 0400 11/20/15 0600 11/20/15 0743  BP: (!) 122/40 (!) 120/46 (!) 159/62 (!) 100/53  Pulse: (!) 44 66 (!) 56 (!) 56  Resp: 17 19 (!) 26 14  Temp:  97.5 F (36.4 C)  98 F (36.7 C)  TempSrc:  Oral  Oral  SpO2: 96% 97% 96% 96%  Weight:  74.2 kg (163 lb 9.3 oz)    Height:        Gen:  NAD    The results of significant diagnostics from this hospitalization (including imaging, microbiology, ancillary and laboratory) are listed below for reference.     Procedures and Diagnostic Studies:   Dg Chest 2 View  Result Date: 11/17/2015 CLINICAL DATA:  Chest pain.  CABG 11 years ago. EXAM: CHEST  2 VIEW COMPARISON:  02/21/2015 FINDINGS: Prior median sternotomy. Minimal pectus excavatum deformity. Moderate thoracic spondylosis. Midline trachea. Borderline cardiomegaly. Atherosclerosis in the transverse aorta. No pleural effusion or pneumothorax. Minimal volume loss in the medial right  lung base. No congestive failure. IMPRESSION: No acute cardiopulmonary disease. Borderline cardiomegaly, without congestive failure. Electronically Signed   By: Abigail Miyamoto M.D.   On: 11/17/2015 18:53   Ct Angio Chest Pe W And/or Wo Contrast  Result Date: 11/17/2015 CLINICAL DATA:  Chest pain, shortness of breath, and bilateral leg swelling. Previous history of pulmonary embolus. Diabetes. EXAM: CT ANGIOGRAPHY CHEST WITH CONTRAST TECHNIQUE: Multidetector CT imaging of the chest was performed using the standard  protocol during bolus administration of intravenous contrast. Multiplanar CT image reconstructions and MIPs were obtained to evaluate the vascular anatomy. CONTRAST:  100 mL Isovue 370 COMPARISON:  02/29/2012 FINDINGS: Cardiovascular: Technically adequate study with good opacification of the central and segmental pulmonary arteries. No focal filling defects. No evidence of significant pulmonary embolus. Mild cardiac enlargement. Thoracic aorta is not dilated. Mediastinum/Nodes: Postoperative changes consistent with sternotomy and coronary artery bypass. No significant lymphadenopathy. Esophagus is decompressed. Lungs/Pleura: Evaluation is limited due to respiratory motion artifact. Emphysematous changes in the lungs. Atelectasis in the lung bases. Mild mosaic attenuation pattern may be due to air trapping or edema. Vague focal nodule demonstrated in the superior segment right lower lobe measuring 5 mm diameter. This was present on the previous study without significant change. No pleural effusion. No pneumothorax. Upper Abdomen: No acute abnormality. Musculoskeletal: Degenerative changes in the spine. No destructive bone lesions. Review of the MIP images confirms the above findings. IMPRESSION: No evidence of significant pulmonary embolus. Cardiac enlargement with possible edema. No focal consolidation. Mild emphysematous changes. Electronically Signed   By: Lucienne Capers M.D.   On: 11/17/2015 21:37     Labs:   Basic Metabolic Panel:  Recent Labs Lab 11/17/15 1802 11/19/15 0347 11/20/15 0426  NA 139 142 137  K 3.9 4.3 4.5  CL 105 107 107  CO2 25 25 22   GLUCOSE 133* 137* 124*  BUN 14 18 18   CREATININE 1.05* 1.06* 1.06*  CALCIUM 10.1 9.3 9.1   GFR Estimated Creatinine Clearance: 53.4 mL/min (by C-G formula based on SCr of 1.06 mg/dL (H)). Liver Function Tests: No results for input(s): AST, ALT, ALKPHOS, BILITOT, PROT, ALBUMIN in the last 168 hours. No results for input(s): LIPASE, AMYLASE  in the last 168 hours. No results for input(s): AMMONIA in the last 168 hours. Coagulation profile  Recent Labs Lab 11/18/15 2040  INR 0.96    CBC:  Recent Labs Lab 11/17/15 1802 11/19/15 0347 11/20/15 0426  WBC 8.4 7.0 7.9  HGB 12.6 11.9* 11.9*  HCT 37.7 36.0 36.9  MCV 91.5 91.6 92.5  PLT 310 263 258   Cardiac Enzymes:  Recent Labs Lab 11/17/15 2135 11/18/15 0025 11/18/15 0327  TROPONINI <0.03 <0.03 <0.03   BNP: Invalid input(s): POCBNP CBG:  Recent Labs Lab 11/19/15 1113 11/19/15 1613 11/19/15 1836 11/19/15 2104 11/20/15 0613  GLUCAP 121* 82 84 152* 142*   D-Dimer No results for input(s): DDIMER in the last 72 hours. Hgb A1c No results for input(s): HGBA1C in the last 72 hours. Lipid Profile No results for input(s): CHOL, HDL, LDLCALC, TRIG, CHOLHDL, LDLDIRECT in the last 72 hours. Thyroid function studies  Recent Labs  11/17/15 2135  TSH 2.162   Anemia work up No results for input(s): VITAMINB12, FOLATE, FERRITIN, TIBC, IRON, RETICCTPCT in the last 72 hours. Microbiology No results found for this or any previous visit (from the past 240 hour(s)).   Discharge Instructions:   Discharge Instructions    AMB Referral to Cardiac Rehabilitation - Phase II  Complete by:  As directed    Diagnosis:  Coronary Stents   Amb Referral to Cardiac Rehabilitation    Complete by:  As directed    Diagnosis:   PTCA Coronary Stents     Diet - low sodium heart healthy    Complete by:  As directed    Diet Carb Modified    Complete by:  As directed    Discharge instructions    Complete by:  As directed    Resume CPAP at home   Increase activity slowly    Complete by:  As directed        Medication List    STOP taking these medications   carvedilol 6.25 MG tablet Commonly known as:  COREG   simvastatin 40 MG tablet Commonly known as:  ZOCOR     TAKE these medications   acetaminophen 500 MG tablet Commonly known as:  TYLENOL Take 1,000 mg  by mouth every 6 (six) hours as needed for headache (pain).   ALPRAZolam 0.25 MG tablet Commonly known as:  XANAX Take 0.25 mg by mouth daily as needed for anxiety.   aspirin EC 81 MG tablet Take 1 tablet (81 mg total) by mouth daily.   COMBIGAN 0.2-0.5 % ophthalmic solution Generic drug:  brimonidine-timolol Place 1 drop into both eyes 2 (two) times daily.   dorzolamide 2 % ophthalmic solution Commonly known as:  TRUSOPT Place 1 drop into both eyes 2 (two) times daily.   isosorbide mononitrate 60 MG 24 hr tablet Commonly known as:  IMDUR Take 1 tablet (60 mg total) by mouth at bedtime. What changed:  medication strength  how much to take   lisinopril 5 MG tablet Commonly known as:  PRINIVIL,ZESTRIL TAKE ONE TABLET BY MOUTH ONCE DAILY   metFORMIN 500 MG tablet Commonly known as:  GLUCOPHAGE Take 1,000 mg by mouth 2 (two) times daily with a meal.   nitroGLYCERIN 0.4 MG SL tablet Commonly known as:  NITROSTAT Place 0.4 mg under the tongue every 5 (five) minutes as needed for chest pain (MAX 3 doses in 15 minutes).   rosuvastatin 20 MG tablet Commonly known as:  CRESTOR Take 1 tablet (20 mg total) by mouth daily at 6 PM.   sertraline 50 MG tablet Commonly known as:  ZOLOFT Take 75 mg by mouth daily.   SYSTANE OP Place 1 drop into both eyes daily as needed (dry eyes).   ticagrelor 90 MG Tabs tablet Commonly known as:  BRILINTA Take 1 tablet (90 mg total) by mouth 2 (two) times daily.      Follow-up Information    Richardson Dopp, PA-C Follow up on 12/03/2015.   Specialties:  Cardiology, Physician Assistant Why:  8:15 AM Contact information: [id number] [address] [phone]        Melinda Crutch, MD Follow up in 1 week(s).   Specialty:  Family Medicine Contact information: [place] [id number] [phone]            Time coordinating discharge: 35 min  Signed:  Eryk Beavers Alison Stalling   [facility] 11/20/2015, 11:23 AM

## 2015-11-20 NOTE — Progress Notes (Signed)
Patient Name: Taylor [name] Date of Encounter: 11/20/2015  Primary Cardiologist: Einar Crow, MD (pt likely to f/u with Joaquim Nam, MD going forward).  Hospital Problem List     Principal Problem:   Unstable angina pectoris [facility]) Active Problems:   CAD, ARTERY BYPASS GRAFT   Hypertensive heart disease   Hypercholesteremia   DM (diabetes mellitus) type I uncontrolled, periph vascular disorder (HCC)   Pain and swelling of left lower leg     Subjective   S/p PCI/DES of the RCA 11/7.  No c/p or dyspnea overnight.   Inpatient Medications    . aspirin EC  81 mg Oral Daily  . brimonidine  1 drop Both Eyes BID   And  . timolol  1 drop Both Eyes BID  . dorzolamide  1 drop Both Eyes BID  . insulin aspart  0-15 Units Subcutaneous TID WC  . isosorbide mononitrate  60 mg Oral QHS  . lisinopril  5 mg Oral Daily  . rosuvastatin  20 mg Oral q1800  . sertraline  75 mg Oral Daily  . sodium chloride flush  3 mL Intravenous Q12H  . ticagrelor  90 mg Oral BID    Vital Signs    Vitals:   11/20/15 0200 11/20/15 0400 11/20/15 0600 11/20/15 0743  BP: (!) 122/40 (!) 120/46 (!) 159/62 (!) 100/53  Pulse: (!) 44 66 (!) 56 (!) 56  Resp: 17 19 (!) 26 14  Temp:  97.5 F (36.4 C)  98 F (36.7 C)  TempSrc:  Oral  Oral  SpO2: 96% 97% 96% 96%  Weight:  163 lb 9.3 oz (74.2 kg)    Height:        Intake/Output Summary (Last 24 hours) at 11/20/15 1059 Last data filed at 11/19/15 1914  Gross per 24 hour  Intake                0 ml  Output              300 ml  Net             -300 ml   Filed Weights   11/17/15 2133 11/19/15 0640 11/20/15 0400  Weight: 163 lb 2.3 oz (74 kg) 162 lb 14.7 oz (73.9 kg) 163 lb 9.3 oz (74.2 kg)    Physical Exam   GEN: Well nourished, well developed, in no acute distress.  HEENT: Grossly normal.  Neck: Supple, no JVD, carotid bruits, or masses. Cardiac: RRR, no murmurs, rubs, or gallops. No clubbing, cyanosis, edema.  Radials/DP/PT 2+ and equal bilaterally. R  wrist cath site w/o bleeding/bruit/hematoma. Respiratory:  Respirations regular and unlabored, clear to auscultation bilaterally. GI: Soft, nontender, nondistended, BS + x 4. MS: no deformity or atrophy. Skin: warm and dry, no rash. Neuro:  Strength and sensation are intact. Psych: AAOx3.  Normal affect.  Labs    CBC  Recent Labs  11/19/15 0347 11/20/15 0426  WBC 7.0 7.9  HGB 11.9* 11.9*  HCT 36.0 36.9  MCV 91.6 92.5  PLT 263 0000000   Basic Metabolic Panel  Recent Labs  11/19/15 0347 11/20/15 0426  NA 142 137  K 4.3 4.5  CL 107 107  CO2 25 22  GLUCOSE 137* 124*  BUN 18 18  CREATININE 1.06* 1.06*  CALCIUM 9.3 9.1   Cardiac Enzymes  Recent Labs  11/17/15 2135 11/18/15 0025 11/18/15 0327  TROPONINI <0.03 <0.03 <0.03   Thyroid Function Tests  Recent Labs  11/17/15 2135  TSH 2.162    Telemetry    RSR/sinus brady.  ECG    Sb, 44, LAD, inc rbbb.  Radiology    No results found.  Assessment & Plan    1.  USA/CAD:  S/p cath 11/7 revealing, new prox RCA dzs with patent mid RCA stent and patent LIMA  LAD.  S/p PCI/DES to the prox RCA.  Ambulated this AM w/o chest pain.  Cont asa, brilinta, high potency statin, nitrate, and acei. No  blocker 2/2 periodic bradycardia.  Cardiac rehab.  Will arrange for f/u within next 2 wks.  2.  Hypertensive Heart Dzs:  Stable on acei.  3.  HL:  Switched from simva 40 to crestor 40 in setting of ACS w/ new RCA dzs.  Discussed indications for more potent therapy with patient.  She will try crestor.  Prev intolerant to lipitor.  4. DM II:  Insulin per IM.  Signed, Murray Hodgkins NP 11/20/2015, 10:59 AM   Attending Note:   The patient was seen and examined.  Agree with assessment and plan as noted above.  Changes made to the above note as needed.  Patient seen and independently examined with Ignacia Bayley, NP .   We discussed all aspects of the encounter. I agree with the assessment and plan as stated above.  1. CAD  :  Patient has done well. Agree with changing simva to Crestor if tolerated.  She is s/p new DES to her mid RCA   She has seen Dr. Aundra Dubin in the [address] office in the past. I would be happy to see her in follow up     I have spent a total of 40 minutes with patient reviewing hospital  notes , telemetry, EKGs, labs and examining patient as well as establishing an assessment and plan that was discussed with the patient. > 50% of time was spent in direct patient care.    Thayer Headings, Brooke Bonito., MD, [facility] 11/20/2015, 11:07 AM [address],  [place] Pager [phone]

## 2015-11-20 NOTE — Progress Notes (Signed)
CARDIAC REHAB PHASE I   PRE:  Rate/Rhythm: 57 SB    BP: sitting 100/53 leg    SaO2:   MODE:  Ambulation: 500 ft   POST:  Rate/Rhythm: 65 SR    BP: sitting 94/59 leg, 169/47 right arm     SaO2:   Pt with slow pace. Asked her to speed up toward end. Denied chest pressure. Did st she felt palpitations after walking, none on monitor. She sts maybe its anxiety. Ed completed/reviewed. Pt is somewhat complacent regarding exercise. Highly encouraged more, helping her to identify obstacles. Will refer to [place]. She understands importance of Brilinta and ASA.  Nunez, ACSM 11/20/2015 8:50 AM

## 2015-11-21 MED FILL — Verapamil HCl IV Soln 2.5 MG/ML: INTRAVENOUS | Qty: 2 | Status: AC

## 2015-11-21 NOTE — Consult Note (Signed)
            [facility] CM Primary Care Navigator  11/21/2015  CHARNIECE KWIAT 1945-01-22 [id number]    Wentto see patient at the bedside to identify possible discharge needs butpatient was already discharged.  Primary care provider's office called (Bonnie)and notified of patient's discharge and need for post hospital follow-up and transition of care.  For additional questions please contact:  Edwena Felty A. Sahaj Bona, BSN, RN-BC [facility] PRIMARY CARE Navigator Cell: [phone]

## 2015-12-02 DIAGNOSIS — I209 Angina pectoris, unspecified: Secondary | ICD-10-CM | POA: Diagnosis not present

## 2015-12-02 DIAGNOSIS — F419 Anxiety disorder, unspecified: Secondary | ICD-10-CM | POA: Diagnosis not present

## 2015-12-03 ENCOUNTER — Ambulatory Visit (INDEPENDENT_AMBULATORY_CARE_PROVIDER_SITE_OTHER): Payer: Medicare Other | Admitting: Physician Assistant

## 2015-12-03 ENCOUNTER — Encounter: Payer: Self-pay | Admitting: Physician Assistant

## 2015-12-03 VITALS — BP 130/72 | HR 53 | Ht 67.5 in | Wt 167.0 lb

## 2015-12-03 DIAGNOSIS — I779 Disorder of arteries and arterioles, unspecified: Secondary | ICD-10-CM | POA: Diagnosis not present

## 2015-12-03 DIAGNOSIS — E7800 Pure hypercholesterolemia, unspecified: Secondary | ICD-10-CM

## 2015-12-03 DIAGNOSIS — I10 Essential (primary) hypertension: Secondary | ICD-10-CM | POA: Diagnosis not present

## 2015-12-03 DIAGNOSIS — I739 Peripheral vascular disease, unspecified: Secondary | ICD-10-CM

## 2015-12-03 DIAGNOSIS — G4733 Obstructive sleep apnea (adult) (pediatric): Secondary | ICD-10-CM | POA: Insufficient documentation

## 2015-12-03 DIAGNOSIS — I6523 Occlusion and stenosis of bilateral carotid arteries: Secondary | ICD-10-CM

## 2015-12-03 DIAGNOSIS — I2510 Atherosclerotic heart disease of native coronary artery without angina pectoris: Secondary | ICD-10-CM | POA: Diagnosis not present

## 2015-12-03 NOTE — Progress Notes (Signed)
Cardiology Office Note:    Date:  12/03/2015   ID:  Taylor [name], DOB 1945-11-09, MRN [id number]  PCP:  Melinda Crutch, MD  Cardiologist:  Dr. Loralie Champagne >> Dr. Liam Rogers   Electrophysiologist:  n/a  Referring MD: Lawerance Cruel, MD   Chief Complaint  Patient presents with  . Hospitalization Follow-up    [place] >> s/p PCI    History of Present Illness:    Taylor [name] is a 70 y.o. female with a hx of CAD s/p CABG in 2006, PAD, prior pulmonary embolism in 2/14, breast CA.  She has known bilateral popliteal artery occlusions that are not ideal for intervention.  She was admitted with [place] in 2/17 and the [facility] and S-Dx/OM were occluded.  She was tx with DES to proximal RCA (99%).  There was pLCx 50-75 that was neg by FFR.  Repeat LHC in 6/17 (CLite was intermediate risk), the RCA stent was patent with new 50 prox to stent.  LCx was 50-60.  Med rx was continued.  Last seen by Dr. Loralie Champagne in 6/17.    She was admitted 11/5-11/8 with unstable angina. Chest CTA was negative for pulmonary embolism. Cardiac enzymes remained negative for myocardial infarction. She was evaluated by cardiology. Cardiac catheterization demonstrated patent stent in the mid RCA. There was stable disease in the LCx. LIMA-LAD was patent. There was high-grade stenosis in the proximal RCA 95% which was treated with a Synergy DES. She was not placed on beta blocker secondary to bradycardia. Statin therapy was adjusted to Crestor. She returns for post hosp follow up.  She is here today with her husband. She has been doing well since DC. She denies chest discomfort or significant dyspnea. She does get short of breath with more extreme activities. She denies orthopnea, PND. She has chronic LE edema especially on the L without significant change. She denies syncope. She denies bleeding issues. She has noted some emotional lability since her recent PCI.  Prior CV studies that were reviewed today include:    LHC  11/19/15 LM normal LAD ostial 100, distal 95, D1 ostial 90 LCx ostial 35, proximal-mid 65, OM2 40, lateral OM2 90 RCA proximal 95, mid stent patent RPDA filled by collaterals from second septal perforator RPLB1 70 LIMA-LAD normal EF 55-65 PCI: 2.75 x 16 mm Synergy DES to the proximal RCA 1. 3 vessel obstructive CAD- culprit lesion is in the mid RCA proximal to prior stent. Otherwise there is no change from prior angiogram in June. The moderate disease in the mid LCx is unchanged and prior FFR of this lesion in February was 0.84.  2. Patent LIMA to the LAD 3. Known occlusion of the SVG to RCA 4. Normal LV function.  5. Mildly elevated LVEDP 6. Successful stenting of the mid RCA with a DES (overlapping with prior stent)  Plan: Continue DAPT for one year. Aggressive risk factor modification. Given severe radial artery spasm I would recommend future coronary angiograms be performed by a femoral route.  Carotid US 9/17 RICA 40-59 >> FU 1 year  Echo 8/14 Mild LVH, EF 60, normal wall motion, trivial MR, mild LAE, normal RVSF  Past Medical History:  Diagnosis Date  . Cancer of left breast ([place])   . Complication of anesthesia   . Coronary artery disease    a. 2006 s/p CABG x 4 (LIMA->LAD, VG->Diag->OM, VG->RPDA);  b. 02/2015: [place] s/p DES to [facility], VG->PDA & VG->D1->OM 100; c. 06/2015 Cath: diffuse dzs->Med rx;  d. 11/2015 Cath/PCI: LM nl, LAD 100ost, 95d, D1 90ost, LCX 35ost, 65p, OM2 40, lat OM2 90, RCA 95p (2.75x16 Synergy DES), patent mid stent, RPLB 70, LIMA->LAD ok.EF55-65%.  . Depression   . Hx of tobacco use, presenting hazards to health    a. quit 2006.  [name] Hypercholesteremia   . Hypertension   . Hypertensive heart disease   . Left carotid bruit    a. 09/2014 Carotid U/S: 1-39% bilat ICA stenosis.  [name] PAD (peripheral artery disease) ([place])    a. 11/2014 ABI: R: 0.63, L 0.59;  b. 11/2014 Periph Angio: bilat pop occlusions. L - short w/ reconstituion via collats in dist pop w/ 2 vessel  runoff, R long w/ reconstitution in prox tib/peroneal arteries-->med Rx w/ pletal.  . Pulmonary embolism ([place]) a. 2014.  [name] Reflux esophagitis   . Type II diabetes mellitus ([place])   1. Depression 2. Breast cancer: s/p left mastectomy in 3/11.  3. PE [id number]: Uncertain etiology, no trigger.  Factor V Leiden negative, prothrombin gene mutation negative, but lupus anticoagulant positive.  Venous dopplers (8/14) with left greater saphenous vein superficial thrombosis.  Repeat antiphospholipid antibody workup negative in 8/14 and again in 1/15.  4. Hyperlipidemia: Myalgias with Crestor and atorvastatin.  5. Type II diabetes 6. HTN 7. Cholescystectomy 8. GERD 9. CAD: s/p CABG in 5/06 with LIMA-LAD, sequential SVG-D and OM, SVG-PDA.  Echo (8/14) with EF 60%, mild LVH, normal RV.  Cardiolite (7/16) with small fixed lateral defect, no ischemia, EF 51% => low risk.  - Unstable angina (2/17): LHC with totally occluded SVG-dRCA and sequential SVG-D/OM, LIMA-LAD patent, 99% mRCA, 75% PDA, totally occluded LAD, 50-75% mLCx => FFR LCx negative, PCI/DES to mRCA.  - LHC (6/17) with totally occluded ostial LAD, free LIMA-LAD patent, 50-60% mLCX (appeared better than prior), 90% ostial/proximal PLOM (small vessel, not amenable to PCI), 50% mRCA, 50% mPLV, SVG-RCA totally occluded.  Medical management.  10. Carotid stenosis: carotids (8/14) with 40-59% bilateral ICA stenosis. Carotid dopplers (9/15) with 1-39% BICA stenosis.  11. Glaucoma 12. PAD: ABIs (10/16) 0.63 R, 0.59 L.  Peripheral angiography [name]) 11/16 with bilateral popliteal occlusion => lesion not optimal for PCI, so cilostazol started (later stopped with need for DAPT).   Past Surgical History:  Procedure Laterality Date  . BREAST BIOPSY Left ~ 2012  . BUNIONECTOMY Left 1970s  . CARDIAC CATHETERIZATION  04/2004  . CARDIAC CATHETERIZATION N/A 02/21/2015   Procedure: Left Heart Cath and Cors/Grafts Angiography;  Surgeon: Belva Crome, MD;  Location: [place] CV [facility];  Service: Cardiovascular;  Laterality: N/A;  . CARDIAC CATHETERIZATION N/A 02/21/2015   Procedure: Coronary Stent Intervention;  Surgeon: Belva Crome, MD;  Location: [place] [facility];  Service: Cardiovascular;  Laterality: N/A;  . CARDIAC CATHETERIZATION N/A 02/21/2015   Procedure: Intravascular Pressure Wire/FFR Study;  Surgeon: Belva Crome, MD;  Location: [place] [facility];  Service: Cardiovascular;  Laterality: N/A;  . CARDIAC CATHETERIZATION N/A 06/20/2015   Procedure: Left Heart Cath and Cors/Grafts Angiography;  Surgeon: Larey Dresser, MD;  Location: [place] [facility];  Service: Cardiovascular;  Laterality: N/A;  . CARDIAC CATHETERIZATION N/A 11/19/2015   Procedure: Left Heart Cath and Cors/Grafts Angiography;  Surgeon: Peter M Martinique, MD;  Location: [place] CV [facility];  Service: Cardiovascular;  Laterality: N/A;  . CARDIAC CATHETERIZATION N/A 11/19/2015   Procedure: Coronary Stent Intervention;  Surgeon: Peter M Martinique, MD;  Location: [place] [facility];  Service: Cardiovascular;  Laterality: N/A;  . CATARACT EXTRACTION W/ INTRAOCULAR LENS  IMPLANT, BILATERAL Bilateral 1980s-1990s  . CORONARY ANGIOPLASTY WITH STENT PLACEMENT  02/21/2015   "1 stent"  . CORONARY ARTERY BYPASS GRAFT  05/2004   "CABG X4"  . EYE SURGERY    . LAPAROSCOPIC CHOLECYSTECTOMY  1990s  . MASTECTOMY Left ~ 2012  . PERIPHERAL VASCULAR CATHETERIZATION N/A 12/12/2014   Procedure: Abdominal Aortogram;  Surgeon: Wellington Hampshire, MD;  Location: [place] [facility];  Service: Cardiovascular;  Laterality: N/A;  . RETINAL DETACHMENT SURGERY Right 1990s    Current Medications: Current Meds  Medication Sig  . acetaminophen (TYLENOL) 500 MG tablet Take 1,000 mg by mouth every 6 (six) hours as needed for headache (pain).   [name] ALPRAZolam (XANAX) 0.25 MG tablet Take 0.25 mg by mouth daily as needed for anxiety.   [name] aspirin EC 81 MG tablet Take 1 tablet (81 mg total) by mouth daily.  . brimonidine-timolol  (COMBIGAN) 0.2-0.5 % ophthalmic solution Place 1 drop into both eyes 2 (two) times daily.  . dorzolamide (TRUSOPT) 2 % ophthalmic solution Place 1 drop into both eyes 2 (two) times daily.   . isosorbide mononitrate (IMDUR) 60 MG 24 hr tablet Take 1 tablet (60 mg total) by mouth at bedtime.  [name] lisinopril (PRINIVIL,ZESTRIL) 5 MG tablet TAKE ONE TABLET BY MOUTH ONCE DAILY  . metFORMIN (GLUCOPHAGE) 500 MG tablet Take 1,000 mg by mouth 2 (two) times daily with a meal.   . nitroGLYCERIN (NITROSTAT) 0.4 MG SL tablet Place 1 tablet (0.4 mg total) under the tongue every 5 (five) minutes as needed for chest pain (MAX 3 doses in 15 minutes).  Vladimir Faster Glycol-Propyl Glycol (SYSTANE OP) Place 1 drop into both eyes daily as needed (dry eyes).  . rosuvastatin (CRESTOR) 20 MG tablet Take 1 tablet (20 mg total) by mouth daily at 6 PM.  . sertraline (ZOLOFT) 100 MG tablet Take 100 mg by mouth.   . ticagrelor (BRILINTA) 90 MG TABS tablet Take 1 tablet (90 mg total) by mouth 2 (two) times daily.     Allergies:   Lipitor [atorvastatin] and Niacin   Social History   Social History  . Marital status: Married    Spouse name: N/A  . Number of children: N/A  . Years of education: N/A   Social History Main Topics  . Smoking status: Former Smoker    Packs/day: 0.50    Years: 15.00    Types: Cigarettes    Quit date: 05/11/2004  . Smokeless tobacco: Never Used  . Alcohol use No  . Drug use: No  . Sexual activity: Not Currently   Other Topics Concern  . Not on file   Social History Narrative   Lives in [place] with her husband.     Family History:  The patient's family history includes Cancer in her sister; Coronary artery disease (age of onset: 22) in her sister; Coronary artery disease (age of onset: 75) in her mother; Deep vein thrombosis in her cousin; Heart attack (age of onset: 47) in her father.   ROS:   Please see the history of present illness.    ROS All other systems reviewed and are  negative.   EKGs/Labs/Other Test Reviewed:    EKG:  EKG is  ordered today.  The ekg ordered today demonstrates sinus brady, HR 53, inc RBBB, QTc 412 ms, no changes since 11/20/15  Recent Labs: 09/06/2015: ALT 10 11/17/2015: TSH 2.162 11/20/2015: BUN 18; Creatinine, Ser 1.06; Hemoglobin 11.9;  Platelets 258; Potassium 4.5; Sodium 137   Recent Lipid Panel    Component Value Date/Time   CHOL 112 (L) 09/06/2015 0957   TRIG 99 09/06/2015 0957   HDL 31 (L) 09/06/2015 0957   CHOLHDL 3.6 09/06/2015 0957   VLDL 20 09/06/2015 0957   LDLCALC 61 09/06/2015 0957     Physical Exam:    VS:  BP 130/72   Pulse (!) 53   Ht 5' 7.5" (1.715 m)   Wt 167 lb (75.8 kg)   BMI 25.77 kg/m     Wt Readings from Last 3 Encounters:  12/03/15 167 lb (75.8 kg)  11/20/15 163 lb 9.3 oz (74.2 kg)  09/19/15 162 lb (73.5 kg)     Physical Exam  Constitutional: She is oriented to person, place, and time. She appears well-developed and well-nourished. No distress.  HENT:  Head: Normocephalic and atraumatic.  Eyes: No scleral icterus.  Neck: Normal range of motion. No JVD present.  Cardiovascular: Normal rate, regular rhythm, S1 normal, S2 normal and normal heart sounds.   No murmur heard. Pulmonary/Chest: Breath sounds normal. She has no wheezes. She has no rhonchi. She has no rales.  Abdominal: Soft. There is no tenderness.  Musculoskeletal: She exhibits edema.  R wrist without hematoma; trace LLE edema  Neurological: She is alert and oriented to person, place, and time.  Skin: Skin is warm and dry.  Psychiatric: She has a normal mood and affect.    ASSESSMENT:    1. Coronary artery disease involving native coronary artery of native heart without angina pectoris   2. Hypercholesteremia   3. Essential hypertension   4. Right-sided carotid artery disease ([place])   5. PAD (peripheral artery disease) ([place])   6. OSA (obstructive sleep apnea)    PLAN:    In order of problems listed above:  1. CAD -  s/p CABG with known occlusion of SVGs.  She is s/p PCI with DES to the RCA.  She has stable mod LCx disease.  Recent admit with [place] tx with DES to [facility]. She is doing well without angina. She is not on beta-blocker 2/2 bradycardia.  She has been emotional since DC.  She can FU with her PCP and continue Zoloft, Xanax prn.  -  Continue ASA, Brilinta, statin, nitrates.  -  She will call cardiac rehabilitation to set up an appt.  2. HL - Statin Rx changed to Crestor.  Check Lipids and LFTs in 6 weeks.  With her aggressive disease, question if we should consider PCSK-9 inhibitor Rx and aim for more aggressive lowering of LDL.  3. HTN - BP controlled.   4. Carotid artery disease - Repeat US due in 9/18.  5. PAD - Med management.  She is followed by Dr. Kathlyn Sacramento.  . 6. OSA - Continue CPAP.     Medication Adjustments/Labs and Tests Ordered: Current medicines are reviewed at length with the patient today.  Concerns regarding medicines are outlined above.  Medication changes, Labs and Tests ordered today are outlined in the Patient Instructions noted below. Patient Instructions  Medication Instructions:  1. Your physician recommends that you continue on your current medications as directed. Please refer to the Current Medication list given to you today.  Labwork: 01/27/16 FOR FASTING LIPID AND LIVER PANEL  Testing/Procedures: NONE  Follow-Up: DR. Acie Fredrickson 01/29/16 @ 11 AM   Any Other Special Instructions Will Be Listed Below (If Applicable).  If you need a refill on your cardiac medications before your  next appointment, please call your pharmacy.  Signed, Richardson Dopp, PA-C  12/03/2015 8:51 AM    [facility] [place], [place], [place]  [address] Phone: [phone]; Fax: [phone]

## 2015-12-03 NOTE — Patient Instructions (Addendum)
Medication Instructions:  1. Your physician recommends that you continue on your current medications as directed. Please refer to the Current Medication list given to you today.  Labwork: 01/27/16 FOR FASTING LIPID AND LIVER PANEL  Testing/Procedures: NONE  Follow-Up: DR. Acie Fredrickson 01/29/16 @ 11 AM   Any Other Special Instructions Will Be Listed Below (If Applicable).  If you need a refill on your cardiac medications before your next appointment, please call your pharmacy.

## 2015-12-03 NOTE — Progress Notes (Signed)
That will be fine. 

## 2015-12-06 ENCOUNTER — Telehealth: Payer: Self-pay

## 2015-12-06 ENCOUNTER — Telehealth: Payer: Self-pay | Admitting: Oncology

## 2015-12-06 NOTE — Telephone Encounter (Signed)
-----   Message from Gordy Levan, MD sent at 12/06/2015 12:26 PM EST ----- Regarding: moving from MD clinic to survivorship Due to active chemo patients, I have needed to review MD schedule for Dec.  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 Please let her know that we would like to set her up for this clinic instead of seeing medical oncologist as previously scheduled for 12-7. Please give her some information on survivorship clinic. She can then be followed in that clinic on ~ yearly schedule as previously (or can get to one of the breast medical oncologists in addition if needed - but start with survivorship clinic)  I have already sent scheduling request for this change. I wrote on the request that I would ask RN to speak with patient also.   I also see that she has recently been hospitalized, so this rescheduling may be helpful for her also. Thanks

## 2015-12-06 NOTE — Telephone Encounter (Signed)
LM for Ms Nicosia to call back next week to discuss information noted below by Dr. Marko Plume for scheduling follow up with survivorship program.   Appointment is scheduled for 01-23-16.

## 2015-12-06 NOTE — Telephone Encounter (Signed)
lvm to inform pt of r/s 12/7 appt to 1/11 LTS date/time per LL 11/24 LOS due to active chemo pt

## 2015-12-09 ENCOUNTER — Encounter: Payer: Self-pay | Admitting: Cardiology

## 2015-12-10 NOTE — Telephone Encounter (Signed)
Spoke with Ms Taylor [name] about her appointment with Mike Craze on 01-23-15 in the Survivorship Clinic as noted below by Dr. Marko Plume. [name] is agreeable to this plan of care.

## 2015-12-15 ENCOUNTER — Encounter: Payer: Self-pay | Admitting: Cardiology

## 2015-12-16 ENCOUNTER — Ambulatory Visit: Payer: Medicare Other | Admitting: Cardiology

## 2015-12-16 ENCOUNTER — Ambulatory Visit (INDEPENDENT_AMBULATORY_CARE_PROVIDER_SITE_OTHER): Payer: Medicare Other | Admitting: Cardiology

## 2015-12-16 ENCOUNTER — Encounter: Payer: Self-pay | Admitting: Cardiology

## 2015-12-16 VITALS — BP 144/72 | HR 68 | Ht 67.5 in | Wt 166.4 lb

## 2015-12-16 DIAGNOSIS — G4733 Obstructive sleep apnea (adult) (pediatric): Secondary | ICD-10-CM

## 2015-12-16 DIAGNOSIS — I6523 Occlusion and stenosis of bilateral carotid arteries: Secondary | ICD-10-CM | POA: Diagnosis not present

## 2015-12-16 DIAGNOSIS — I10 Essential (primary) hypertension: Secondary | ICD-10-CM | POA: Diagnosis not present

## 2015-12-16 NOTE — Progress Notes (Signed)
Cardiology Office Note    Date:  12/16/2015   ID:  Taylor [name], DOB 1945-08-05, MRN [id number]  PCP:  Melinda Crutch, MD  Cardiologist:  Fransico Him, MD   Chief Complaint  Patient presents with  . Sleep Apnea  . Hypertension    History of Present Illness:  Taylor [name] is a 70 y.o. female with a history of CAD, hyperlipidemia and HTN who was referred for sleep study. She complained to Dr. Aundra Dubin that she was having severe daytime fatigue and sleepiness and felt terrible.   She underwent PSG showing moderate OSA with an AHI of 16.3/hr, loud snoring and oxygen desaturations as low as 83%.  She underwent CPAP titration but could not be adequately titrated on BiPAP.  She was set on home auto BiPAP titration.  She presents today for followup.  She has remained on auto BiPAP since starting BiPAP.  She is doing well with her device.  She tolerates her full face mask and feels the pressure is adequate.  Since going on BiPAP she feels more rested in the am and has less daytime sleepiness but still has some at the end of the day.  She does not think she is snoring but is not sure.  She denies any significant mouth dryness or nasal congestion.    Past Medical History:  Diagnosis Date  . Cancer of left breast ([place])   . Complication of anesthesia   . Coronary artery disease    a. 2006 s/p CABG x 4 (LIMA->LAD, VG->Diag->OM, VG->RPDA);  b. 02/2015: [place] s/p DES to [facility], VG->PDA & VG->D1->OM 100; c. 06/2015 Cath: diffuse dzs->Med rx; d. 11/2015 Cath/PCI: LM nl, LAD 100ost, 95d, D1 90ost, LCX 35ost, 65p, OM2 40, lat OM2 90, RCA 95p (2.75x16 Synergy DES), patent mid stent, RPLB 70, LIMA->LAD ok.EF55-65%.  . Depression   . Hx of tobacco use, presenting hazards to health    a. quit 2006.  [name] Hypercholesteremia   . Hypertension   . Hypertensive heart disease   . Left carotid bruit    a. 09/2014 Carotid U/S: 1-39% bilat ICA stenosis.  [name] PAD (peripheral artery disease) ([place])    a. 11/2014 ABI: R: 0.63, L  0.59;  b. 11/2014 Periph Angio: bilat pop occlusions. L - short w/ reconstituion via collats in dist pop w/ 2 vessel runoff, R long w/ reconstitution in prox tib/peroneal arteries-->med Rx w/ pletal.  . Pulmonary embolism ([place]) a. 2014.  [name] Reflux esophagitis   . Type II diabetes mellitus ([place])     Past Surgical History:  Procedure Laterality Date  . BREAST BIOPSY Left ~ 2012  . BUNIONECTOMY Left 1970s  . CARDIAC CATHETERIZATION  04/2004  . CARDIAC CATHETERIZATION N/A 02/21/2015   Procedure: Left Heart Cath and Cors/Grafts Angiography;  Surgeon: Belva Crome, MD;  Location: [place] [facility];  Service: Cardiovascular;  Laterality: N/A;  . CARDIAC CATHETERIZATION N/A 02/21/2015   Procedure: Coronary Stent Intervention;  Surgeon: Belva Crome, MD;  Location: [place] [facility];  Service: Cardiovascular;  Laterality: N/A;  . CARDIAC CATHETERIZATION N/A 02/21/2015   Procedure: Intravascular Pressure Wire/FFR Study;  Surgeon: Belva Crome, MD;  Location: [place] [facility];  Service: Cardiovascular;  Laterality: N/A;  . CARDIAC CATHETERIZATION N/A 06/20/2015   Procedure: Left Heart Cath and Cors/Grafts Angiography;  Surgeon: Larey Dresser, MD;  Location: [place] [facility];  Service: Cardiovascular;  Laterality: N/A;  . CARDIAC CATHETERIZATION N/A 11/19/2015   Procedure: Left Heart  Cath and Cors/Grafts Angiography;  Surgeon: Peter M Martinique, MD;  Location: [place] [facility];  Service: Cardiovascular;  Laterality: N/A;  . CARDIAC CATHETERIZATION N/A 11/19/2015   Procedure: Coronary Stent Intervention;  Surgeon: Peter M Martinique, MD;  Location: [place] [facility];  Service: Cardiovascular;  Laterality: N/A;  . CATARACT EXTRACTION W/ INTRAOCULAR LENS  IMPLANT, BILATERAL Bilateral 1980s-1990s  . CORONARY ANGIOPLASTY WITH STENT PLACEMENT  02/21/2015   "1 stent"  . CORONARY ARTERY BYPASS GRAFT  05/2004   "CABG X4"  . EYE SURGERY    . LAPAROSCOPIC CHOLECYSTECTOMY  1990s  . MASTECTOMY Left ~ 2012  .  PERIPHERAL VASCULAR CATHETERIZATION N/A 12/12/2014   Procedure: Abdominal Aortogram;  Surgeon: Wellington Hampshire, MD;  Location: [place] [facility];  Service: Cardiovascular;  Laterality: N/A;  . RETINAL DETACHMENT SURGERY Right 1990s    Current Medications: Outpatient Medications Prior to Visit  Medication Sig Dispense Refill  . acetaminophen (TYLENOL) 500 MG tablet Take 1,000 mg by mouth every 6 (six) hours as needed for headache (pain).     [name] ALPRAZolam (XANAX) 0.25 MG tablet Take 0.25 mg by mouth daily as needed for anxiety.     [name] aspirin EC 81 MG tablet Take 1 tablet (81 mg total) by mouth daily. 90 tablet 0  . brimonidine-timolol (COMBIGAN) 0.2-0.5 % ophthalmic solution Place 1 drop into both eyes 2 (two) times daily.    . dorzolamide (TRUSOPT) 2 % ophthalmic solution Place 1 drop into both eyes 2 (two) times daily.     . isosorbide mononitrate (IMDUR) 60 MG 24 hr tablet Take 1 tablet (60 mg total) by mouth at bedtime. 30 tablet 0  . lisinopril (PRINIVIL,ZESTRIL) 5 MG tablet TAKE ONE TABLET BY MOUTH ONCE DAILY 90 tablet 2  . metFORMIN (GLUCOPHAGE) 500 MG tablet Take 1,000 mg by mouth 2 (two) times daily with a meal.     . nitroGLYCERIN (NITROSTAT) 0.4 MG SL tablet Place 1 tablet (0.4 mg total) under the tongue every 5 (five) minutes as needed for chest pain (MAX 3 doses in 15 minutes). 15 tablet 0  . Polyethyl Glycol-Propyl Glycol (SYSTANE OP) Place 1 drop into both eyes daily as needed (dry eyes).    . rosuvastatin (CRESTOR) 20 MG tablet Take 1 tablet (20 mg total) by mouth daily at 6 PM. 30 tablet 0  . sertraline (ZOLOFT) 100 MG tablet Take 100 mg by mouth.     . ticagrelor (BRILINTA) 90 MG TABS tablet Take 1 tablet (90 mg total) by mouth 2 (two) times daily. 180 tablet 6   Facility-Administered Medications Prior to Visit  Medication Dose Route Frequency Provider Last Rate Last Dose  . aminophylline injection 75 mg  75 mg Intravenous BID PRN Larey Dresser, MD   75 mg at 08/08/14 1335      Allergies:   Lipitor [atorvastatin] and Niacin   Social History   Social History  . Marital status: Married    Spouse name: N/A  . Number of children: N/A  . Years of education: N/A   Social History Main Topics  . Smoking status: Former Smoker    Packs/day: 0.50    Years: 15.00    Types: Cigarettes    Quit date: 05/11/2004  . Smokeless tobacco: Never Used  . Alcohol use No  . Drug use: No  . Sexual activity: Not Currently   Other Topics Concern  . None   Social History Narrative   Lives in [place] with her husband.  Family History:  The patient's family history includes Cancer in her sister; Coronary artery disease (age of onset: 18) in her sister; Coronary artery disease (age of onset: 16) in her mother; Deep vein thrombosis in her cousin; Heart attack (age of onset: 68) in her father.   ROS:   Please see the history of present illness.    ROS All other systems reviewed and are negative.  No flowsheet data found.     PHYSICAL EXAM:   VS:  BP (!) 144/72   Pulse 68   Ht 5' 7.5" (1.715 m)   Wt 166 lb 6.4 oz (75.5 kg)   SpO2 99%   BMI 25.68 kg/m    GEN: Well nourished, well developed, in no acute distress  HEENT: normal  Neck: no JVD, carotid bruits, or masses Cardiac: RRR; no murmurs, rubs, or gallops,no edema.  Intact distal pulses bilaterally.  Respiratory:  clear to auscultation bilaterally, normal work of breathing GI: soft, nontender, nondistended, + BS MS: no deformity or atrophy  Skin: warm and dry, no rash Neuro:  Alert and Oriented x 3, Strength and sensation are intact Psych: euthymic mood, full affect  Wt Readings from Last 3 Encounters:  12/16/15 166 lb 6.4 oz (75.5 kg)  12/03/15 167 lb (75.8 kg)  11/20/15 163 lb 9.3 oz (74.2 kg)      Studies/Labs Reviewed:   EKG:  EKG is not ordered today.    Recent Labs: 09/06/2015: ALT 10 11/17/2015: TSH 2.162 11/20/2015: BUN 18; Creatinine, Ser 1.06; Hemoglobin 11.9; Platelets 258;  Potassium 4.5; Sodium 137   Lipid Panel    Component Value Date/Time   CHOL 112 (L) 09/06/2015 0957   TRIG 99 09/06/2015 0957   HDL 31 (L) 09/06/2015 0957   CHOLHDL 3.6 09/06/2015 0957   VLDL 20 09/06/2015 0957   LDLCALC 61 09/06/2015 0957    Additional studies/ records that were reviewed today include:  BiPAP download    ASSESSMENT:    1. OSA (obstructive sleep apnea)   2. Essential hypertension      PLAN:  In order of problems listed above:  1.  OSA - the patient is tolerating PAP therapy well without any problems. The PAP download was reviewed today and showed an AHI of 2.2/hr on autoBiPAP with 77% compliance in using more than 4 hours nightly.  The patient has been using and benefiting from CPAP use and will continue to benefit from therapy. She is tolerating auto BIPAP which we will continue.   2.   HTN - BP controlled on current meds.  Continue ACE I.    Medication Adjustments/Labs and Tests Ordered: Current medicines are reviewed at length with the patient today.  Concerns regarding medicines are outlined above.  Medication changes, Labs and Tests ordered today are listed in the Patient Instructions below.  There are no Patient Instructions on file for this visit.   Signed, Fransico Him, MD  12/16/2015 2:39 PM    [address], [place], [place]  [address] Phone: [phone]; Fax: [phone]

## 2015-12-16 NOTE — Patient Instructions (Signed)

## 2015-12-19 ENCOUNTER — Ambulatory Visit: Payer: Medicare Other | Admitting: Oncology

## 2015-12-19 ENCOUNTER — Other Ambulatory Visit: Payer: Medicare Other

## 2015-12-19 ENCOUNTER — Encounter: Payer: Medicare Other | Admitting: Adult Health

## 2015-12-19 ENCOUNTER — Other Ambulatory Visit: Payer: Self-pay

## 2015-12-19 MED ORDER — ROSUVASTATIN CALCIUM 20 MG PO TABS: 20.0000 mg | ORAL_TABLET | Freq: Every day | ORAL | 10 refills | Status: DC

## 2015-12-24 ENCOUNTER — Other Ambulatory Visit: Payer: Self-pay | Admitting: Physician Assistant

## 2015-12-24 MED ORDER — ISOSORBIDE MONONITRATE ER 60 MG PO TB24: 60.0000 mg | ORAL_TABLET | Freq: Every day | ORAL | 11 refills | Status: DC

## 2016-01-22 ENCOUNTER — Other Ambulatory Visit: Payer: Self-pay | Admitting: Adult Health

## 2016-01-22 DIAGNOSIS — Z170 Estrogen receptor positive status [ER+]: Principal | ICD-10-CM

## 2016-01-22 DIAGNOSIS — C50912 Malignant neoplasm of unspecified site of left female breast: Secondary | ICD-10-CM

## 2016-01-23 ENCOUNTER — Other Ambulatory Visit (HOSPITAL_BASED_OUTPATIENT_CLINIC_OR_DEPARTMENT_OTHER): Payer: Medicare Other

## 2016-01-23 ENCOUNTER — Ambulatory Visit (HOSPITAL_BASED_OUTPATIENT_CLINIC_OR_DEPARTMENT_OTHER): Payer: Medicare Other | Admitting: Adult Health

## 2016-01-23 ENCOUNTER — Encounter: Payer: Self-pay | Admitting: Adult Health

## 2016-01-23 VITALS — BP 119/54 | HR 59 | Temp 97.8°F | Resp 18 | Ht 67.5 in | Wt 166.7 lb

## 2016-01-23 DIAGNOSIS — C50912 Malignant neoplasm of unspecified site of left female breast: Secondary | ICD-10-CM

## 2016-01-23 DIAGNOSIS — Z86000 Personal history of in-situ neoplasm of breast: Secondary | ICD-10-CM | POA: Diagnosis not present

## 2016-01-23 DIAGNOSIS — F418 Other specified anxiety disorders: Secondary | ICD-10-CM

## 2016-01-23 DIAGNOSIS — Z853 Personal history of malignant neoplasm of breast: Secondary | ICD-10-CM

## 2016-01-23 DIAGNOSIS — Z170 Estrogen receptor positive status [ER+]: Principal | ICD-10-CM

## 2016-01-23 DIAGNOSIS — Z1231 Encounter for screening mammogram for malignant neoplasm of breast: Secondary | ICD-10-CM

## 2016-01-23 LAB — CBC WITH DIFFERENTIAL/PLATELET
BASO%: 0.3 % (ref 0.0–2.0)
BASOS ABS: 0 10*3/uL (ref 0.0–0.1)
EOS%: 1.7 % (ref 0.0–7.0)
Eosinophils Absolute: 0.2 10*3/uL (ref 0.0–0.5)
HCT: 39.3 % (ref 34.8–46.6)
HEMOGLOBIN: 13 g/dL (ref 11.6–15.9)
LYMPH#: 2 10*3/uL (ref 0.9–3.3)
LYMPH%: 22 % (ref 14.0–49.7)
MCH: 30.4 pg (ref 25.1–34.0)
MCHC: 33.1 g/dL (ref 31.5–36.0)
MCV: 91.8 fL (ref 79.5–101.0)
MONO#: 0.5 10*3/uL (ref 0.1–0.9)
MONO%: 5.5 % (ref 0.0–14.0)
NEUT#: 6.3 10*3/uL (ref 1.5–6.5)
NEUT%: 70.5 % (ref 38.4–76.8)
Platelets: 273 10*3/uL (ref 145–400)
RBC: 4.28 10*6/uL (ref 3.70–5.45)
RDW: 12.4 % (ref 11.2–14.5)
WBC: 8.9 10*3/uL (ref 3.9–10.3)

## 2016-01-23 LAB — COMPREHENSIVE METABOLIC PANEL
ALBUMIN: 4.2 g/dL (ref 3.5–5.0)
ALK PHOS: 57 U/L (ref 40–150)
ALT: 15 U/L (ref 0–55)
AST: 21 U/L (ref 5–34)
Anion Gap: 8 mEq/L (ref 3–11)
BUN: 19.4 mg/dL (ref 7.0–26.0)
CHLORIDE: 106 meq/L (ref 98–109)
CO2: 26 mEq/L (ref 22–29)
CREATININE: 1.1 mg/dL (ref 0.6–1.1)
Calcium: 10 mg/dL (ref 8.4–10.4)
EGFR: 52 mL/min/{1.73_m2} — ABNORMAL LOW (ref >90)
GLUCOSE: 113 mg/dL (ref 70–140)
POTASSIUM: 4.2 meq/L (ref 3.5–5.1)
SODIUM: 140 meq/L (ref 136–145)
Total Bilirubin: 0.6 mg/dL (ref 0.20–1.20)
Total Protein: 7.5 g/dL (ref 6.4–8.3)

## 2016-01-23 NOTE — Progress Notes (Signed)
CLINIC:  Survivorship   REASON FOR VISIT:  Routine follow-up for history of breast cancer.   BRIEF ONCOLOGIC HISTORY:  (From Dr. Mariana Kaufman last note on 12/20/14)    INTERVAL HISTORY:  Taylor [name] presents to the [facility] today for routine follow-up for her history of breast cancer.  Overall, she reports feeling quite well.  She has chronic sinus problems.  She is working with her PCP to optimize her anxiety and depression management with medications. She works with her cardiologist regarding her history of blood clots and coronary artery disease.  She denies any breast or chest wall problems. She is requesting a refill for her left breast prosthesis and mastectomy bra today.  Otherwise, she is largely without complaints.     REVIEW OF SYSTEMS:  Review of Systems  Constitutional: Negative.   HENT:       Chronic sinus problems   Eyes: Negative.   Respiratory: Negative.   Cardiovascular: Negative.   Gastrointestinal: Negative.   Genitourinary: Negative.   Musculoskeletal: Negative.   Skin: Negative.   Neurological: Negative.   Endo/Heme/Allergies: Negative.   Psychiatric/Behavioral: Positive for depression. The patient is nervous/anxious.   Breast: Denies any new nodularity, masses, tenderness, nipple changes, or nipple discharge.    A 14-point review of systems was completed and was negative, except as noted above.    PAST MEDICAL/SURGICAL HISTORY:  Past Medical History:  Diagnosis Date  . Cancer of left breast ([place])   . Complication of anesthesia   . Coronary artery disease    a. 2006 s/p CABG x 4 (LIMA->LAD, VG->Diag->OM, VG->RPDA);  b. 02/2015: [place] s/p DES to [facility], VG->PDA & VG->D1->OM 100; c. 06/2015 Cath: diffuse dzs->Med rx; d. 11/2015 Cath/PCI: LM nl, LAD 100ost, 95d, D1 90ost, LCX 35ost, 65p, OM2 40, lat OM2 90, RCA 95p (2.75x16 Synergy DES), patent mid stent, RPLB 70, LIMA->LAD ok.EF55-65%.  . Depression   . Hx of tobacco use, presenting hazards to  health    a. quit 2006.  [name] Hypercholesteremia   . Hypertension   . Hypertensive heart disease   . Left carotid bruit    a. 09/2014 Carotid U/S: 1-39% bilat ICA stenosis.  [name] PAD (peripheral artery disease) ([place])    a. 11/2014 ABI: R: 0.63, L 0.59;  b. 11/2014 Periph Angio: bilat pop occlusions. L - short w/ reconstituion via collats in dist pop w/ 2 vessel runoff, R long w/ reconstitution in prox tib/peroneal arteries-->med Rx w/ pletal.  . Pulmonary embolism ([place]) a. 2014.  [name] Reflux esophagitis   . Type II diabetes mellitus ([place])    Past Surgical History:  Procedure Laterality Date  . BREAST BIOPSY Left ~ 2012  . BUNIONECTOMY Left 1970s  . CARDIAC CATHETERIZATION  04/2004  . CARDIAC CATHETERIZATION N/A 02/21/2015   Procedure: Left Heart Cath and Cors/Grafts Angiography;  Surgeon: Belva Crome, MD;  Location: [place] [facility];  Service: Cardiovascular;  Laterality: N/A;  . CARDIAC CATHETERIZATION N/A 02/21/2015   Procedure: Coronary Stent Intervention;  Surgeon: Belva Crome, MD;  Location: [place] [facility];  Service: Cardiovascular;  Laterality: N/A;  . CARDIAC CATHETERIZATION N/A 02/21/2015   Procedure: Intravascular Pressure Wire/FFR Study;  Surgeon: Belva Crome, MD;  Location: [place] [facility];  Service: Cardiovascular;  Laterality: N/A;  . CARDIAC CATHETERIZATION N/A 06/20/2015   Procedure: Left Heart Cath and Cors/Grafts Angiography;  Surgeon: Larey Dresser, MD;  Location: [place] [facility];  Service: Cardiovascular;  Laterality: N/A;  . CARDIAC  CATHETERIZATION N/A 11/19/2015   Procedure: Left Heart Cath and Cors/Grafts Angiography;  Surgeon: Peter M Martinique, MD;  Location: [place] [facility];  Service: Cardiovascular;  Laterality: N/A;  . CARDIAC CATHETERIZATION N/A 11/19/2015   Procedure: Coronary Stent Intervention;  Surgeon: Peter M Martinique, MD;  Location: [place] [facility];  Service: Cardiovascular;  Laterality: N/A;  . CATARACT EXTRACTION W/ INTRAOCULAR LENS  IMPLANT,  BILATERAL Bilateral 1980s-1990s  . CORONARY ANGIOPLASTY WITH STENT PLACEMENT  02/21/2015   "1 stent"  . CORONARY ARTERY BYPASS GRAFT  05/2004   "CABG X4"  . EYE SURGERY    . LAPAROSCOPIC CHOLECYSTECTOMY  1990s  . MASTECTOMY Left ~ 2012  . PERIPHERAL VASCULAR CATHETERIZATION N/A 12/12/2014   Procedure: Abdominal Aortogram;  Surgeon: Wellington Hampshire, MD;  Location: [place] [facility];  Service: Cardiovascular;  Laterality: N/A;  . RETINAL DETACHMENT SURGERY Right 1990s     ALLERGIES:  Allergies  Allergen Reactions  . Lipitor [Atorvastatin] Other (See Comments)    Muscle pain  . Niacin Other (See Comments)    burning     CURRENT MEDICATIONS:  Outpatient Encounter Prescriptions as of 01/23/2016  Medication Sig Note  . acetaminophen (TYLENOL) 500 MG tablet Take 1,000 mg by mouth every 6 (six) hours as needed for headache (pain).    [name] ALPRAZolam (XANAX) 0.25 MG tablet Take 0.25 mg by mouth daily as needed for anxiety.    [name] aspirin EC 81 MG tablet Take 1 tablet (81 mg total) by mouth daily.   [name] b complex vitamins tablet Take 1 tablet by mouth daily.   . brimonidine-timolol (COMBIGAN) 0.2-0.5 % ophthalmic solution Place 1 drop into both eyes 2 (two) times daily. 11/17/2015: One dose today  . dorzolamide (TRUSOPT) 2 % ophthalmic solution Place 1 drop into both eyes 2 (two) times daily.    . isosorbide mononitrate (IMDUR) 60 MG 24 hr tablet Take 1 tablet (60 mg total) by mouth at bedtime.   [name] lisinopril (PRINIVIL,ZESTRIL) 5 MG tablet TAKE ONE TABLET BY MOUTH ONCE DAILY   . metFORMIN (GLUCOPHAGE) 500 MG tablet Take 1,000 mg by mouth 2 (two) times daily with a meal.    . nitroGLYCERIN (NITROSTAT) 0.4 MG SL tablet Place 1 tablet (0.4 mg total) under the tongue every 5 (five) minutes as needed for chest pain (MAX 3 doses in 15 minutes).   Vladimir Faster Glycol-Propyl Glycol (SYSTANE OP) Place 1 drop into both eyes daily as needed (dry eyes).   . rosuvastatin (CRESTOR) 20 MG tablet Take 1 tablet (20  mg total) by mouth daily at 6 PM.   . sertraline (ZOLOFT) 100 MG tablet Take 100 mg by mouth.    . ticagrelor (BRILINTA) 90 MG TABS tablet Take 1 tablet (90 mg total) by mouth 2 (two) times daily.    Facility-Administered Encounter Medications as of 01/23/2016  Medication  . aminophylline injection 75 mg     ONCOLOGIC FAMILY HISTORY:  Family History  Problem Relation Age of Onset  . Coronary artery disease Mother 53    status post CABG   . Heart attack Father 65    deceased secondary to fatal  first myocardial infarction  . Coronary artery disease Sister 83    alive had bypass surgery  . Cancer Sister   . Deep vein thrombosis Cousin     Neice had after knee surgery    GENETIC COUNSELING/TESTING: No results available for review.   SOCIAL HISTORY:  Taylor [name] is married and lives  with her husband in [place], [place]. They have 1 son and 1 grandson.  She is retired. Denies any current tobacco, alcohol, or illicit drug use.     PHYSICAL EXAMINATION:  Vital Signs: Vitals:   01/23/16 1410  BP: (!) 119/54  Pulse: (!) 59  Resp: 18  Temp: 97.8 F (36.6 C)   Filed Weights   01/23/16 1410  Weight: 166 lb 11.2 oz (75.6 kg)   General: Well-nourished, well-appearing female in no acute distress.  Unaccompanied today.   HEENT: Head is normocephalic.  Pupils equal and reactive to light. Conjunctivae clear without exudate.  Sclerae anicteric. Oral mucosa is pink, moist.  Oropharynx is pink without lesions or erythema.  Lymph: No cervical, supraclavicular, or infraclavicular lymphadenopathy noted on palpation.  Cardiovascular: Regular rate and rhythm.[name] Respiratory: Clear to auscultation bilaterally. Chest expansion symmetric; breathing non-labored.  Breast Exam:  -Left breast: s/p mastectomy. No appreciable masses on palpation. No skin redness, thickening, or peau d'orange appearance; healed scar without erythema or nodularity.  -Right breast: No appreciable masses on palpation.  No skin redness, thickening, or peau d'orange appearance; no nipple retraction or nipple discharge. -Axilla: No axillary adenopathy bilaterally.  GI: Abdomen soft and round; non-tender, non-distended. Bowel sounds normoactive. No hepatosplenomegaly.   GU: Deferred.  Neuro: No focal deficits. Steady gait.  Psych: Mood and affect normal and appropriate for situation.  Extremities: No edema. Skin: Warm and dry.  LABORATORY DATA:  CBC Latest Ref Rng & Units 01/23/2016 11/20/2015 11/19/2015  WBC 3.9 - 10.3 10e3/uL 8.9 7.9 7.0  Hemoglobin 11.6 - 15.9 g/dL 13.0 11.9(L) 11.9(L)  Hematocrit 34.8 - 46.6 % 39.3 36.9 36.0  Platelets 145 - 400 10e3/uL 273 258 263   CMP Latest Ref Rng & Units 01/23/2016 11/20/2015 11/19/2015  Glucose 70 - 140 mg/dl 113 124(H) 137(H)  BUN 7.0 - 26.0 mg/dL 19.4 18 18   Creatinine 0.6 - 1.1 mg/dL 1.1 1.06(H) 1.06(H)  Sodium 136 - 145 mEq/L 140 137 142  Potassium 3.5 - 5.1 mEq/L 4.2 4.5 4.3  Chloride 101 - 111 mmol/L - 107 107  CO2 22 - 29 mEq/L 26 22 25   Calcium 8.4 - 10.4 mg/dL 10.0 9.1 9.3  Total Protein 6.4 - 8.3 g/dL 7.5 - -  Total Bilirubin 0.20 - 1.20 mg/dL 0.60 - -  Alkaline Phos 40 - 150 U/L 57 - -  AST 5 - 34 U/L 21 - -  ALT 0 - 55 U/L 15 - -   *Labs reviewed and are largely stable/within normal limits.     DIAGNOSTIC IMAGING:  Most recent mammogram: 09/11/15     ASSESSMENT AND PLAN:  Taylor [name] is a pleasant 71 y.o. female with history of Stage 0 left breast DCIS, ER+/PR+, diagnosed in 2011, treated with left mastectomy.  She [facility] to the [facility] for surveillance and routine follow-up.   1. History of Stage 0 left breast cancer:  Taylor [name] is currently clinically and radiographically without evidence of disease or recurrence of breast cancer. She will be due for unilateral right breast screening mammogram in 08/2016; orders placed today.  Given that she is now nearly 7 years out from her original breast cancer diagnosis without evidence  of recurrence, she has the option to "graduate" from follow-up.  She is enthusiastic with this plan, as she has several other specialists and sees her PCP regularly.  She was given a pink "graduation tassel" and "Survivorship Award" today. Of course, we would be happy to see her anytime in  the future, if needed.  She understands that her PCP should perform annual breast/chest wall exams and she will need mammogram annually. Her prognosis going forward with regards to her breast cancer is great. She is welcome to call us or return to the cancer center anytime, however we will not place any future follow-up appointments at this time.  I have faxed refill orders for her mastectomy bra and breast prosthesis to the [facility] in [place] at the patient's request.   2. Bone health:  Given Taylor [name]'s age & history of breast cancer, she is at risk for bone demineralization. Her last DEXA scan was on 08/19/06 and was normal.  Given that she did not require anti-estrogen therapy, I will defer any future bone mineral density testing to her PCP, as clinically indicated.   3. Health maintenance and wellness promotion: Taylor [name] was encouraged to consume 5-7 servings of fruits and vegetables per day. She was also encouraged to engage in moderate to vigorous exercise for 30 minutes per day most days of the week. She was instructed to limit her alcohol consumption and continue to abstain from tobacco use.    Dispo:  -Annual mammogram due in 08/2016; orders placed today.  -"Graduate" from follow-up here at the cancer center today. She is welcome to return anytime in the future or contact us with questions or concerns as it relates to her history of breast cancer. I wished her well.    A total of 25 minutes of face-to-face time was spent with this patient with greater than 50% of that time in counseling and care-coordination.   Mike Craze, NP Survivorship Program [place] [phone]   Note: PRIMARY CARE PROVIDER Melinda Crutch, [place] [phone]

## 2016-01-27 ENCOUNTER — Telehealth: Payer: Self-pay | Admitting: Cardiology

## 2016-01-27 ENCOUNTER — Other Ambulatory Visit (INDEPENDENT_AMBULATORY_CARE_PROVIDER_SITE_OTHER): Payer: Medicare Other

## 2016-01-27 DIAGNOSIS — I2510 Atherosclerotic heart disease of native coronary artery without angina pectoris: Secondary | ICD-10-CM | POA: Diagnosis not present

## 2016-01-27 DIAGNOSIS — E7800 Pure hypercholesterolemia, unspecified: Secondary | ICD-10-CM

## 2016-01-27 NOTE — Telephone Encounter (Signed)
Patient dropped off [employer] Verification form needs to be completed-will give to [employer].

## 2016-01-27 NOTE — Addendum Note (Signed)
Addended by: Velna Ochs on: 01/27/2016 10:34 AM   Modules accepted: Orders

## 2016-01-28 DIAGNOSIS — H9319 Tinnitus, unspecified ear: Secondary | ICD-10-CM | POA: Diagnosis not present

## 2016-01-28 DIAGNOSIS — F419 Anxiety disorder, unspecified: Secondary | ICD-10-CM | POA: Diagnosis not present

## 2016-01-28 DIAGNOSIS — E1165 Type 2 diabetes mellitus with hyperglycemia: Secondary | ICD-10-CM | POA: Diagnosis not present

## 2016-01-28 DIAGNOSIS — R42 Dizziness and giddiness: Secondary | ICD-10-CM | POA: Diagnosis not present

## 2016-01-28 LAB — LIPID PANEL
CHOL/HDL RATIO: 3 ratio (ref 0.0–4.4)
CHOLESTEROL TOTAL: 86 mg/dL — AB (ref 100–199)
HDL: 29 mg/dL — ABNORMAL LOW (ref >39)
LDL CALC: 30 mg/dL (ref 0–99)
TRIGLYCERIDES: 134 mg/dL (ref 0–149)
VLDL CHOLESTEROL CAL: 27 mg/dL (ref 5–40)

## 2016-01-28 LAB — HEPATIC FUNCTION PANEL
ALT: 13 IU/L (ref 0–32)
AST: 20 IU/L (ref 0–40)
Albumin: 4.6 g/dL (ref 3.6–4.8)
Alkaline Phosphatase: 47 IU/L (ref 39–117)
Bilirubin Total: 0.4 mg/dL (ref 0.0–1.2)
Bilirubin, Direct: 0.14 mg/dL (ref 0.00–0.40)
Total Protein: 7.3 g/dL (ref 6.0–8.5)

## 2016-01-29 ENCOUNTER — Ambulatory Visit: Payer: Medicare Other | Admitting: Cardiovascular Disease

## 2016-01-30 ENCOUNTER — Ambulatory Visit: Payer: Medicare Other | Admitting: Cardiovascular Disease

## 2016-02-04 ENCOUNTER — Ambulatory Visit (INDEPENDENT_AMBULATORY_CARE_PROVIDER_SITE_OTHER): Payer: Medicare Other | Admitting: Cardiovascular Disease

## 2016-02-04 ENCOUNTER — Encounter: Payer: Self-pay | Admitting: Cardiovascular Disease

## 2016-02-04 VITALS — BP 130/60 | HR 75 | Ht 67.0 in | Wt 165.1 lb

## 2016-02-04 DIAGNOSIS — I2510 Atherosclerotic heart disease of native coronary artery without angina pectoris: Secondary | ICD-10-CM | POA: Diagnosis not present

## 2016-02-04 NOTE — Patient Instructions (Signed)

## 2016-02-04 NOTE — Progress Notes (Signed)
Cardiology Office Note:    Date:  02/04/2016   ID:  [name], DOB [birth year], MRN [id number]  PCP:  Melinda Crutch, MD  Cardiologist:  Dr. Loralie Champagne , Berneice Gandy >> Dr. Liam Rogers   Electrophysiologist:  n/a  Referring MD: Taylor Cruel, MD   Chief Complaint  Patient presents with  . Coronary Artery Disease    History of Present Illness:    Taylor [name] is a 71 y.o. female with a hx of CAD s/p CABG in 2006, PAD, prior pulmonary embolism in 2/14, breast CA.  She has known bilateral popliteal artery occlusions that are not ideal for intervention.  She was admitted with [place] in 2/17 and the [facility] and S-Dx/OM were occluded.  She was tx with DES to proximal RCA (99%).  There was pLCx 50-75 that was neg by FFR.  Repeat LHC in 6/17 (CLite was intermediate risk), the RCA stent was patent with new 50 prox to stent.  LCx was 50-60.  Med rx was continued.  Last seen by Dr. Loralie Champagne in 6/17.    She was admitted 11/5-11/8 with unstable angina. Chest CTA was negative for pulmonary embolism. Cardiac enzymes remained negative for myocardial infarction. She was evaluated by cardiology. Cardiac catheterization demonstrated patent stent in the mid RCA. There was stable disease in the LCx. LIMA-LAD was patent. There was high-grade stenosis in the proximal RCA 95% which was treated with a Synergy DES. She was not placed on beta blocker secondary to bradycardia. Statin therapy was adjusted to Crestor. She returns for post hosp follow up.  She is here today with her husband. She has been doing well since DC. She denies chest discomfort or significant dyspnea. She does get short of breath with more extreme activities. She denies orthopnea, PND. She has chronic LE edema especially on the L without significant change. She denies syncope. She denies bleeding issues. She has noted some emotional lability since her recent PCI.  Prior CV studies that were reviewed today include:    LHC 11/19/15 LM  normal LAD ostial 100, distal 95, D1 ostial 90 LCx ostial 35, proximal-mid 65, OM2 40, lateral OM2 90 RCA proximal 95, mid stent patent RPDA filled by collaterals from second septal perforator RPLB1 70 LIMA-LAD normal EF 55-65 PCI: 2.75 x 16 mm Synergy DES to the proximal RCA 1. 3 vessel obstructive CAD- culprit lesion is in the mid RCA proximal to prior stent. Otherwise there is no change from prior angiogram in June. The moderate disease in the mid LCx is unchanged and prior FFR of this lesion in February was 0.84.  2. Patent LIMA to the LAD 3. Known occlusion of the SVG to RCA 4. Normal LV function.  5. Mildly elevated LVEDP 6. Successful stenting of the mid RCA with a DES (overlapping with prior stent)  Plan: Continue DAPT for one year. Aggressive risk factor modification. Given severe radial artery spasm I would recommend future coronary angiograms be performed by a femoral route.  Carotid US 9/17 RICA 40-59 >> FU 1 year  Echo 8/14 Mild LVH, EF 60, normal wall motion, trivial MR, mild LAE, normal RVSF  Past Medical History:  Diagnosis Date  . Cancer of left breast ([place])   . Complication of anesthesia   . Coronary artery disease    a. 2006 s/p CABG x 4 (LIMA->LAD, VG->Diag->OM, VG->RPDA);  b. 02/2015: [place] s/p DES to [facility], VG->PDA & VG->D1->OM 100; c. 06/2015 Cath: diffuse dzs->Med rx; d. 11/2015 Cath/PCI:  LM nl, LAD 100ost, 95d, D1 90ost, LCX 35ost, 65p, OM2 40, lat OM2 90, RCA 95p (2.75x16 Synergy DES), patent mid stent, RPLB 70, LIMA->LAD ok.EF55-65%.  . Depression   . Hx of tobacco use, presenting hazards to health    a. quit 2006.  [name] Hypercholesteremia   . Hypertension   . Hypertensive heart disease   . Left carotid bruit    a. 09/2014 Carotid U/S: 1-39% bilat ICA stenosis.  [name] PAD (peripheral artery disease) ([place])    a. 11/2014 ABI: R: 0.63, L 0.59;  b. 11/2014 Periph Angio: bilat pop occlusions. L - short w/ reconstituion via collats in dist pop w/ 2 vessel runoff, R long  w/ reconstitution in prox tib/peroneal arteries-->med Rx w/ pletal.  . Pulmonary embolism ([place]) a. 2014.  [name] Reflux esophagitis   . Type II diabetes mellitus ([place])   1. Depression 2. Breast cancer: s/p left mastectomy in 3/11.  3. PE 3/23: Uncertain etiology, no trigger.  Factor V Leiden negative, prothrombin gene mutation negative, but lupus anticoagulant positive.  Venous dopplers (8/14) with left greater saphenous vein superficial thrombosis.  Repeat antiphospholipid antibody workup negative in 8/14 and again in 1/15.  4. Hyperlipidemia: Myalgias with Crestor and atorvastatin.  5. Type II diabetes 6. HTN 7. Cholescystectomy 8. GERD 9. CAD: s/p CABG in 5/06 with LIMA-LAD, sequential SVG-D and OM, SVG-PDA.  Echo (8/14) with EF 60%, mild LVH, normal RV.  Cardiolite (7/16) with small fixed lateral defect, no ischemia, EF 51% => low risk.  - Unstable angina (2/17): LHC with totally occluded SVG-dRCA and sequential SVG-D/OM, LIMA-LAD patent, 99% mRCA, 75% PDA, totally occluded LAD, 50-75% mLCx => FFR LCx negative, PCI/DES to mRCA.  - LHC (6/17) with totally occluded ostial LAD, free LIMA-LAD patent, 50-60% mLCX (appeared better than prior), 90% ostial/proximal PLOM (small vessel, not amenable to PCI), 50% mRCA, 50% mPLV, SVG-RCA totally occluded.  Medical management.  10. Carotid stenosis: carotids (8/14) with 40-59% bilateral ICA stenosis. Carotid dopplers (9/15) with 1-39% BICA stenosis.  11. Glaucoma 12. PAD: ABIs (10/16) 0.63 R, 0.59 L.  Peripheral angiography [name]) 11/16 with bilateral popliteal occlusion => lesion not optimal for PCI, so cilostazol started (later stopped with need for DAPT).   Jan. 23, 2018:  Seen for the first time in the office.   I met her during a hospitalization in Nov. 2017. She has not felt well.   Was having headaches - likely due to Imdur.  Cath in Nov. Shows small vessel disease in addition to an RCA culprit vessel that was stented.  Lipids are optimal  Non  smoker  Does not exercise.   Gets out of breath . Has PVD also  Was on Pletal but this was stopped when she was started on Brilinta.     Has a remote history of pulmonary embolus  In 2014. She was previously on Coumadin but this was stopped by her hematologist. She's not had any recurrent pulmonary emboli since 2014.  Past Surgical History:  Procedure Laterality Date  . BREAST BIOPSY Left ~ 2012  . BUNIONECTOMY Left 1970s  . CARDIAC CATHETERIZATION  04/2004  . CARDIAC CATHETERIZATION N/A 02/21/2015   Procedure: Left Heart Cath and Cors/Grafts Angiography;  Surgeon: Belva Crome, MD;  Location: [place] [facility];  Service: Cardiovascular;  Laterality: N/A;  . CARDIAC CATHETERIZATION N/A 02/21/2015   Procedure: Coronary Stent Intervention;  Surgeon: Belva Crome, MD;  Location: [place] [facility];  Service: Cardiovascular;  Laterality: N/A;  .  CARDIAC CATHETERIZATION N/A 02/21/2015   Procedure: Intravascular Pressure Wire/FFR Study;  Surgeon: Belva Crome, MD;  Location: [place] [facility];  Service: Cardiovascular;  Laterality: N/A;  . CARDIAC CATHETERIZATION N/A 06/20/2015   Procedure: Left Heart Cath and Cors/Grafts Angiography;  Surgeon: Larey Dresser, MD;  Location: [place] [facility];  Service: Cardiovascular;  Laterality: N/A;  . CARDIAC CATHETERIZATION N/A 11/19/2015   Procedure: Left Heart Cath and Cors/Grafts Angiography;  Surgeon: Peter M Martinique, MD;  Location: [place] [facility];  Service: Cardiovascular;  Laterality: N/A;  . CARDIAC CATHETERIZATION N/A 11/19/2015   Procedure: Coronary Stent Intervention;  Surgeon: Peter M Martinique, MD;  Location: [place] [facility];  Service: Cardiovascular;  Laterality: N/A;  . CATARACT EXTRACTION W/ INTRAOCULAR LENS  IMPLANT, BILATERAL Bilateral 1980s-1990s  . CORONARY ANGIOPLASTY WITH STENT PLACEMENT  02/21/2015   "1 stent"  . CORONARY ARTERY BYPASS GRAFT  05/2004   "CABG X4"  . EYE SURGERY    . LAPAROSCOPIC CHOLECYSTECTOMY  1990s  . MASTECTOMY  Left ~ 2012  . PERIPHERAL VASCULAR CATHETERIZATION N/A 12/12/2014   Procedure: Abdominal Aortogram;  Surgeon: Wellington Hampshire, MD;  Location: [place] [facility];  Service: Cardiovascular;  Laterality: N/A;  . RETINAL DETACHMENT SURGERY Right 1990s    Current Medications: Current Meds  Medication Sig  . acetaminophen (TYLENOL) 500 MG tablet Take 1,000 mg by mouth every 6 (six) hours as needed for headache (pain).   [name] ALPRAZolam (XANAX) 0.25 MG tablet Take 0.25 mg by mouth daily as needed for anxiety.   [name] aspirin EC 81 MG tablet Take 1 tablet (81 mg total) by mouth daily.  [name] b complex vitamins tablet Take 1 tablet by mouth daily.  . brimonidine-timolol (COMBIGAN) 0.2-0.5 % ophthalmic solution Place 1 drop into both eyes 2 (two) times daily.  . dorzolamide (TRUSOPT) 2 % ophthalmic solution Place 1 drop into both eyes 2 (two) times daily.   . isosorbide mononitrate (IMDUR) 60 MG 24 hr tablet Take 1 tablet (60 mg total) by mouth at bedtime.  [name] lisinopril (PRINIVIL,ZESTRIL) 5 MG tablet TAKE ONE TABLET BY MOUTH ONCE DAILY  . metFORMIN (GLUCOPHAGE) 500 MG tablet Take 1,000 mg by mouth 2 (two) times daily with a meal.   . nitroGLYCERIN (NITROSTAT) 0.4 MG SL tablet Place 1 tablet (0.4 mg total) under the tongue every 5 (five) minutes as needed for chest pain (MAX 3 doses in 15 minutes).  Vladimir Faster Glycol-Propyl Glycol (SYSTANE OP) Place 1 drop into both eyes daily as needed (dry eyes).  . rosuvastatin (CRESTOR) 20 MG tablet Take 1 tablet (20 mg total) by mouth daily at 6 PM.  . sertraline (ZOLOFT) 100 MG tablet Take 100 mg by mouth.   . ticagrelor (BRILINTA) 90 MG TABS tablet Take 1 tablet (90 mg total) by mouth 2 (two) times daily.     Allergies:   Lipitor [atorvastatin] and Niacin   Social History   Social History  . Marital status: Married    Spouse name: N/A  . Number of children: N/A  . Years of education: N/A   Social History Main Topics  . Smoking status: Former Smoker     Packs/day: 0.50    Years: 15.00    Types: Cigarettes    Quit date: 05/11/2004  . Smokeless tobacco: Never Used  . Alcohol use No  . Drug use: No  . Sexual activity: Not Currently   Other Topics Concern  . None   Social History Narrative  Lives in [place] with her husband.     Family History:  The patient's family history includes Cancer in her sister; Coronary artery disease (age of onset: 74) in her sister; Coronary artery disease (age of onset: 40) in her mother; Deep vein thrombosis in her cousin; Heart attack (age of onset: 35) in her father.   ROS:   Please see the history of present illness.    ROS All other systems reviewed and are negative.   EKGs/Labs/Other Test Reviewed:    EKG:  EKG is  ordered today.  The ekg ordered today demonstrates sinus brady, HR 53, inc RBBB, QTc 412 ms, no changes since 11/20/15  Recent Labs: 11/17/2015: TSH 2.162 01/23/2016: BUN 19.4; Creatinine 1.1; HGB 13.0; Platelets 273; Potassium 4.2; Sodium 140 01/27/2016: ALT 13   Recent Lipid Panel    Component Value Date/Time   CHOL 86 (L) 01/27/2016 1034   TRIG 134 01/27/2016 1034   HDL 29 (L) 01/27/2016 1034   CHOLHDL 3.0 01/27/2016 1034   CHOLHDL 3.6 09/06/2015 0957   VLDL 20 09/06/2015 0957   LDLCALC 30 01/27/2016 1034     Physical Exam:    VS:  BP 130/60   Pulse 75   Ht _0  (1.702 m)   Wt 165 lb 1.9 oz (74.9 kg)   SpO2 97%   BMI 25.86 kg/m     Wt Readings from Last 3 Encounters:  02/04/16 165 lb 1.9 oz (74.9 kg)  01/23/16 166 lb 11.2 oz (75.6 kg)  12/16/15 166 lb 6.4 oz (75.5 kg)     Physical Exam  Constitutional: She is oriented to person, place, and time. She appears well-developed and well-nourished. No distress.  HENT:  Head: Normocephalic and atraumatic.  Eyes: No scleral icterus.  Neck: Normal range of motion. No JVD present.  Cardiovascular: Normal rate, regular rhythm, S1 normal, S2 normal and normal heart sounds.   No murmur heard. Pulmonary/Chest:  Breath sounds normal. She has no wheezes. She has no rhonchi. She has no rales.  Abdominal: Soft. There is no tenderness.  Musculoskeletal: She exhibits edema.  R wrist without hematoma; trace LLE edema  Neurological: She is alert and oriented to person, place, and time.  Skin: Skin is warm and dry.  Psychiatric: She has a normal mood and affect.    ASSESSMENT:    1. Coronary artery disease involving native coronary artery of native heart without angina pectoris    PLAN:    In order of problems listed above:  1. CAD - s/p CABG with known occlusion of SVGs.  She is s/p PCI with DES to the RCA.  She has stable mod LCx disease.  Recent admit with [place] tx with DES to [facility]. She is doing well without angina. She is not on beta-blocker 2/2 bradycardia.    -  Continue ASA, Brilinta, statin, nitrates.  -  She will call cardiac rehabilitation to set up an appt.  2. HL - Statin Rx changed to Crestor.  Check Lipids and LFTs in 6 weeks.   Her LDL levels were very low. Continue Crestor for now.  3. HTN - BP controlled.   4. Carotid artery disease - Repeat US due in 9/18.  5. PAD - Med management.  She is followed by Dr. Kathlyn Sacramento.  . 6. OSA - Continue CPAP.     Medication Adjustments/Labs and Tests Ordered: Current medicines are reviewed at length with the patient today.  Concerns regarding medicines are outlined above.  Medication changes,  Labs and Tests ordered today are outlined in the Patient Instructions noted below. Patient Instructions  Medication Instructions:  Your physician recommends that you continue on your current medications as directed. Please refer to the Current Medication list given to you today.   Labwork: Your physician recommends that you return for lab work in: 6 months on the day of or a few days before your office visit with Dr. Acie Fredrickson.  You will need to FAST for this appointment - nothing to eat or drink after midnight the night before except  water.    Testing/Procedures: None Ordered   Follow-Up: Your physician wants you to follow-up in: 6 months with Dr. Acie Fredrickson.  You will receive a reminder letter in the mail two months in advance. If you don't receive a letter, please call our office to schedule the follow-up appointment.   If you need a refill on your cardiac medications before your next appointment, please call your pharmacy.   Thank you for choosing [facility]! Christen Bame, RN [phone]          Mertie Moores, MD  02/04/2016 12:59 PM    [address], [place], [place]  [id number] Phone: [phone]; Fax: [phone]

## 2016-03-01 ENCOUNTER — Other Ambulatory Visit: Payer: Self-pay | Admitting: Physician Assistant

## 2016-03-03 ENCOUNTER — Other Ambulatory Visit: Payer: Self-pay

## 2016-03-03 ENCOUNTER — Encounter (HOSPITAL_COMMUNITY): Payer: Self-pay | Admitting: Family Medicine

## 2016-03-03 DIAGNOSIS — I10 Essential (primary) hypertension: Secondary | ICD-10-CM

## 2016-03-03 DIAGNOSIS — E7800 Pure hypercholesterolemia, unspecified: Secondary | ICD-10-CM

## 2016-03-03 DIAGNOSIS — I2510 Atherosclerotic heart disease of native coronary artery without angina pectoris: Secondary | ICD-10-CM

## 2016-03-03 DIAGNOSIS — I2583 Coronary atherosclerosis due to lipid rich plaque: Secondary | ICD-10-CM

## 2016-03-03 DIAGNOSIS — I6523 Occlusion and stenosis of bilateral carotid arteries: Secondary | ICD-10-CM

## 2016-03-03 MED ORDER — LISINOPRIL 5 MG PO TABS: 5.0000 mg | ORAL_TABLET | Freq: Every day | ORAL | 3 refills | Status: DC

## 2016-03-03 NOTE — Progress Notes (Signed)
Mailed letter with Cardiac Rehab Program to pt ... KJ  °

## 2016-04-07 ENCOUNTER — Ambulatory Visit (INDEPENDENT_AMBULATORY_CARE_PROVIDER_SITE_OTHER): Payer: Medicare Other | Admitting: Cardiovascular Disease

## 2016-04-07 ENCOUNTER — Encounter: Payer: Self-pay | Admitting: Cardiovascular Disease

## 2016-04-07 VITALS — BP 116/68 | HR 64 | Ht 67.5 in | Wt 169.6 lb

## 2016-04-07 DIAGNOSIS — E785 Hyperlipidemia, unspecified: Secondary | ICD-10-CM | POA: Diagnosis not present

## 2016-04-07 DIAGNOSIS — I779 Disorder of arteries and arterioles, unspecified: Secondary | ICD-10-CM

## 2016-04-07 DIAGNOSIS — I739 Peripheral vascular disease, unspecified: Secondary | ICD-10-CM | POA: Diagnosis not present

## 2016-04-07 DIAGNOSIS — I25118 Atherosclerotic heart disease of native coronary artery with other forms of angina pectoris: Secondary | ICD-10-CM

## 2016-04-07 DIAGNOSIS — I209 Angina pectoris, unspecified: Secondary | ICD-10-CM

## 2016-04-07 DIAGNOSIS — I6523 Occlusion and stenosis of bilateral carotid arteries: Secondary | ICD-10-CM

## 2016-04-07 NOTE — Patient Instructions (Signed)
Medication Instructions:  Your physician recommends that you continue on your current medications as directed. Please refer to the Current Medication list given to you today.   Follow-Up: Your physician wants you to follow-up in: 1 YEAR with Dr. [name]. You will receive a reminder letter in the mail two months in advance. If you don't receive a letter, please call our office to schedule the follow-up appointment.   Any Other Special Instructions Will Be Listed Below (If Applicable).     If you need a refill on your cardiac medications before your next appointment, please call your pharmacy.

## 2016-04-07 NOTE — Progress Notes (Signed)
Cardiology Office Note   Date:  04/07/2016   ID:  Taylor [name], DOB 1945/08/13, MRN [id number]  PCP:  Melinda Crutch, MD  Cardiologist:  Dr. Aundra Dubin  Chief Complaint  Patient presents with  . Follow-up    PAD      History of Present Illness: Taylor [name] is a 71 y.o. female who presents for a follow-up visit regarding peripheral arterial disease. She has known history of CAD s/p CABG in 2006, pulmonary embolism in 2014, breast cancer status post left mastectomy in 2011, hyperlipidemia, type 2 diabetes and hypertension. She is not a smoker. She is followed by me for bilateral calf claudication. Lower extremity angiography in November 2016 showed bilateral popliteal artery occlusion. She was treated medically without revascularization. She presented with unstable angina in February 2017. She was found to have critical mid RCA stenosis.  Since her last visit, she underwent stenting of the right coronary artery in November. She has been doing reasonably well with stable anginal symptoms. She also reports stable bilateral calf claudication which is equal in both legs.   Past Medical History:  Diagnosis Date  . Cancer of left breast ([place])   . Complication of anesthesia   . Coronary artery disease    a. 2006 s/p CABG x 4 (LIMA->LAD, VG->Diag->OM, VG->RPDA);  b. 02/2015: [place] s/p DES to [facility], VG->PDA & VG->D1->OM 100; c. 06/2015 Cath: diffuse dzs->Med rx; d. 11/2015 Cath/PCI: LM nl, LAD 100ost, 95d, D1 90ost, LCX 35ost, 65p, OM2 40, lat OM2 90, RCA 95p (2.75x16 Synergy DES), patent mid stent, RPLB 70, LIMA->LAD ok.EF55-65%.  . Depression   . Hx of tobacco use, presenting hazards to health    a. quit 2006.  [name] Hypercholesteremia   . Hypertension   . Hypertensive heart disease   . Left carotid bruit    a. 09/2014 Carotid U/S: 1-39% bilat ICA stenosis.  [name] PAD (peripheral artery disease) ([place])    a. 11/2014 ABI: R: 0.63, L 0.59;  b. 11/2014 Periph Angio: bilat pop occlusions. L - short w/  reconstituion via collats in dist pop w/ 2 vessel runoff, R long w/ reconstitution in prox tib/peroneal arteries-->med Rx w/ pletal.  . Pulmonary embolism ([place]) a. 2014.  [name] Reflux esophagitis   . Type II diabetes mellitus ([place])     Past Surgical History:  Procedure Laterality Date  . BREAST BIOPSY Left ~ 2012  . BUNIONECTOMY Left 1970s  . CARDIAC CATHETERIZATION  04/2004  . CARDIAC CATHETERIZATION N/A 02/21/2015   Procedure: Left Heart Cath and Cors/Grafts Angiography;  Surgeon: Belva Crome, MD;  Location: [place] [facility];  Service: Cardiovascular;  Laterality: N/A;  . CARDIAC CATHETERIZATION N/A 02/21/2015   Procedure: Coronary Stent Intervention;  Surgeon: Belva Crome, MD;  Location: [place] [facility];  Service: Cardiovascular;  Laterality: N/A;  . CARDIAC CATHETERIZATION N/A 02/21/2015   Procedure: Intravascular Pressure Wire/FFR Study;  Surgeon: Belva Crome, MD;  Location: [place] [facility];  Service: Cardiovascular;  Laterality: N/A;  . CARDIAC CATHETERIZATION N/A 06/20/2015   Procedure: Left Heart Cath and Cors/Grafts Angiography;  Surgeon: Larey Dresser, MD;  Location: [place] [facility];  Service: Cardiovascular;  Laterality: N/A;  . CARDIAC CATHETERIZATION N/A 11/19/2015   Procedure: Left Heart Cath and Cors/Grafts Angiography;  Surgeon: Peter M Martinique, MD;  Location: [place] [facility];  Service: Cardiovascular;  Laterality: N/A;  . CARDIAC CATHETERIZATION N/A 11/19/2015   Procedure: Coronary Stent Intervention;  Surgeon: Peter M Martinique,  MD;  Location: [place] [facility];  Service: Cardiovascular;  Laterality: N/A;  . CATARACT EXTRACTION W/ INTRAOCULAR LENS  IMPLANT, BILATERAL Bilateral 1980s-1990s  . CORONARY ANGIOPLASTY WITH STENT PLACEMENT  02/21/2015   "1 stent"  . CORONARY ARTERY BYPASS GRAFT  05/2004   "CABG X4"  . EYE SURGERY    . LAPAROSCOPIC CHOLECYSTECTOMY  1990s  . MASTECTOMY Left ~ 2012  . PERIPHERAL VASCULAR CATHETERIZATION N/A 12/12/2014   Procedure: Abdominal  Aortogram;  Surgeon: Wellington Hampshire, MD;  Location: [place] [facility];  Service: Cardiovascular;  Laterality: N/A;  . RETINAL DETACHMENT SURGERY Right 1990s     Current Outpatient Prescriptions  Medication Sig Dispense Refill  . acetaminophen (TYLENOL) 500 MG tablet Take 1,000 mg by mouth every 6 (six) hours as needed for headache (pain).     [name] ALPRAZolam (XANAX) 0.25 MG tablet Take 0.25 mg by mouth daily as needed for anxiety.     [name] aspirin 81 MG tablet Take 81 mg by mouth daily.    [name] BRILINTA 90 MG TABS tablet TAKE ONE TABLET BY MOUTH TWICE DAILY 180 tablet 3  . brimonidine-timolol (COMBIGAN) 0.2-0.5 % ophthalmic solution Place 1 drop into both eyes 2 (two) times daily.    . dorzolamide (TRUSOPT) 2 % ophthalmic solution Place 1 drop into both eyes 2 (two) times daily.     . isosorbide mononitrate (IMDUR) 60 MG 24 hr tablet Take 1 tablet (60 mg total) by mouth at bedtime. 30 tablet 11  . lisinopril (PRINIVIL,ZESTRIL) 5 MG tablet Take 1 tablet (5 mg total) by mouth daily. 90 tablet 3  . metFORMIN (GLUCOPHAGE) 500 MG tablet Take 1,000 mg by mouth 2 (two) times daily with a meal.     . nitroGLYCERIN (NITROSTAT) 0.4 MG SL tablet Place 1 tablet (0.4 mg total) under the tongue every 5 (five) minutes as needed for chest pain (MAX 3 doses in 15 minutes). 15 tablet 0  . Polyethyl Glycol-Propyl Glycol (SYSTANE OP) Place 1 drop into both eyes daily as needed (dry eyes).    . rosuvastatin (CRESTOR) 20 MG tablet Take 1 tablet (20 mg total) by mouth daily at 6 PM. 30 tablet 10  . sertraline (ZOLOFT) 100 MG tablet Take 100 mg by mouth.      No current facility-administered medications for this visit.    Facility-Administered Medications Ordered in Other Visits  Medication Dose Route Frequency Provider Last Rate Last Dose  . aminophylline injection 75 mg  75 mg Intravenous BID PRN Larey Dresser, MD   75 mg at 08/08/14 1335    Allergies:   Lipitor [atorvastatin] and Niacin    Social History:  The  patient  reports that she quit smoking about 11 years ago. Her smoking use included Cigarettes. She has a 7.50 pack-year smoking history. She has never used smokeless tobacco. She reports that she does not drink alcohol or use drugs.   Family History:  The patient's family history includes Cancer in her sister; Coronary artery disease (age of onset: 17) in her sister; Coronary artery disease (age of onset: 24) in her mother; Deep vein thrombosis in her cousin; Heart attack (age of onset: 60) in her father.    ROS:  Please see the history of present illness.   Otherwise, review of systems are positive for none.   All other systems are reviewed and negative.    PHYSICAL EXAM: VS:  BP 116/68   Pulse 64   Ht 5' 7.5" (1.715 m)  Wt 169 lb 9.6 oz (76.9 kg)   BMI 26.17 kg/m  , BMI Body mass index is 26.17 kg/m. GEN: Well nourished, well developed, in no acute distress  HEENT: normal  Neck: no JVD, or masses. Positive carotid bruits Cardiac: RRR; no murmurs, rubs, or gallops,no edema  Respiratory:  clear to auscultation bilaterally, normal work of breathing GI: soft, nontender, nondistended, + BS MS: no deformity or atrophy  Skin: warm and dry, no rash Neuro:  Strength and sensation are intact Psych: euthymic mood, full affect   EKG:  EKG is not ordered today.    Recent Labs: 11/17/2015: TSH 2.162 01/23/2016: BUN 19.4; Creatinine 1.1; HGB 13.0; Platelets 273; Potassium 4.2; Sodium 140 01/27/2016: ALT 13    Lipid Panel    Component Value Date/Time   CHOL 86 (L) 01/27/2016 1034   TRIG 134 01/27/2016 1034   HDL 29 (L) 01/27/2016 1034   CHOLHDL 3.0 01/27/2016 1034   CHOLHDL 3.6 09/06/2015 0957   VLDL 20 09/06/2015 0957   LDLCALC 30 01/27/2016 1034      Wt Readings from Last 3 Encounters:  04/07/16 169 lb 9.6 oz (76.9 kg)  02/04/16 165 lb 1.9 oz (74.9 kg)  01/23/16 166 lb 11.2 oz (75.6 kg)       ASSESSMENT AND PLAN:  1.  Peripheral arterial disease: She has known  bilateral popliteal artery occlusion.  She has stable moderate bilateral calf claudication. Continue medical therapy. I discussed with her the importance of a walking program. We can consider resuming cilostazol once she is off dual antiplatelet therapy.  2. Coronary artery disease status post CABG with other forms of angina:  She reports stable symptoms and some improvement after RCA PCI.  3. Right carotid stenosis: Most recent Doppler in September 2017 showed 40-59% right ICA stenosis. Repeat study in one year.  4. Hyperlipidemia: Most recent LDL was 30. Continue treatment with rosuvastatin.    Disposition:   FU with me in 12 months  Signed,  Taylor Sacramento, MD  04/07/2016 12:53 PM    [place]

## 2016-04-24 DIAGNOSIS — H40013 Open angle with borderline findings, low risk, bilateral: Secondary | ICD-10-CM | POA: Diagnosis not present

## 2016-05-06 DIAGNOSIS — H9313 Tinnitus, bilateral: Secondary | ICD-10-CM | POA: Diagnosis not present

## 2016-05-06 DIAGNOSIS — H903 Sensorineural hearing loss, bilateral: Secondary | ICD-10-CM | POA: Diagnosis not present

## 2016-05-06 DIAGNOSIS — J301 Allergic rhinitis due to pollen: Secondary | ICD-10-CM | POA: Diagnosis not present

## 2016-06-10 ENCOUNTER — Telehealth: Payer: Self-pay | Admitting: Cardiovascular Disease

## 2016-06-10 NOTE — Telephone Encounter (Signed)
Spoke with patient who c/o SOB with exertion. She denies chest pain or n/v. States she feels fatigued but this is different from sleep apnea.  She has not been monitoring BP but states she will go home and begin to monitor HR and BP. States she has periodic episodes of feeling light-headed and/or dizzy, sometimes when she is sitting. She states she was scheduled to see Dr. Acie Fredrickson in September. I advised her to call back with BP and HR readings and that I can make a sooner appointment if needed. She verbalized understanding and agreement and thanked me for the call.

## 2016-06-10 NOTE — Telephone Encounter (Signed)
New message    Pt is calling asking for a call back from RN about her problems. She said she is feeling lightheaded and when she sits down it goes to her head like she is going to pass out.

## 2016-06-11 NOTE — Telephone Encounter (Signed)
Agree with getting BP and HR information before we make changes

## 2016-06-24 DIAGNOSIS — H40013 Open angle with borderline findings, low risk, bilateral: Secondary | ICD-10-CM | POA: Diagnosis not present

## 2016-07-06 ENCOUNTER — Telehealth: Payer: Self-pay | Admitting: Cardiovascular Disease

## 2016-07-06 DIAGNOSIS — I2510 Atherosclerotic heart disease of native coronary artery without angina pectoris: Secondary | ICD-10-CM

## 2016-07-06 DIAGNOSIS — I2583 Coronary atherosclerosis due to lipid rich plaque: Secondary | ICD-10-CM

## 2016-07-06 DIAGNOSIS — I10 Essential (primary) hypertension: Secondary | ICD-10-CM

## 2016-07-06 DIAGNOSIS — Z79899 Other long term (current) drug therapy: Secondary | ICD-10-CM

## 2016-07-06 DIAGNOSIS — E7800 Pure hypercholesterolemia, unspecified: Secondary | ICD-10-CM

## 2016-07-06 DIAGNOSIS — I6523 Occlusion and stenosis of bilateral carotid arteries: Secondary | ICD-10-CM

## 2016-07-06 MED ORDER — LISINOPRIL 10 MG PO TABS: 10.0000 mg | ORAL_TABLET | Freq: Every day | ORAL | 3 refills | Status: DC

## 2016-07-06 NOTE — Telephone Encounter (Signed)
Patient c/o being tired, SOB with activity, and elevated BP.  Patients BP this morning before she took her meds was 180/80 with HR of 74. Patient stated her BP yesterday was 141/78 and HR 70's. Patient stated her BP has been high the last couple of days. Patient stated she hasn't made any changes to her diet and she is keeping to a low salt diet. Informed patient she should take her BP an hour after taking her lisinopril to see if it is helping her BP. Patient stated she was taken off her Coreg due to her low HR back in November and only takes lisinopril 5 mg in the morning, and imdur 60 mg at night. Patient has a follow-up appt with APP on 07/22/16, next available appt. Will forward to Dr. Acie Fredrickson for advisement.

## 2016-07-06 NOTE — Telephone Encounter (Signed)
New message    Pt is calling.    Pt c/o BP issue: STAT if pt c/o blurred vision, one-sided weakness or slurred speech  1. What are your last 5 BP readings? 180/80  2. Are you having any other symptoms (ex. Dizziness, headache, blurred vision, passed out)? Tired, not feeling well,   3. What is your BP issue? Pt states her bp has been running high.  Pt c/o Shortness Of Breath: STAT if SOB developed within the last 24 hours or pt is noticeably SOB on the phone  1. Are you currently SOB (can you hear that pt is SOB on the phone)? Pt states a little winded, can not hear on the phone   2. How long have you been experiencing SOB? Since last saw Dr. Acie Fredrickson   3. Are you SOB when sitting or when up moving around? Moving around  4. Are you currently experiencing any other symptoms? tired

## 2016-07-06 NOTE — Telephone Encounter (Signed)
Increase Lisinopril up to 10 mg a day  Check BMP in 3 weeks. Have her continue to record BP and HR

## 2016-07-06 NOTE — Telephone Encounter (Signed)
Called patient about Dr. Elmarie Shiley message. Patient verbalized understanding. Will send prescription to patient's pharmacy of choice. Patient will come in on 07/27/16 for BMET, and she will continue to record her BP and HR.

## 2016-07-21 NOTE — Progress Notes (Signed)
Cardiology Office Note    Date:  07/22/2016   ID:  Taylor [name], DOB Jul 20, 1945, MRN [id number]  PCP:  Lawerance Cruel, MD  Cardiologist:  Dr. Acie Fredrickson PV: Dr. [name]   Chief Complaint: Elevated BP  History of Present Illness:   Taylor [name] is a 71 y.o. female hx of CAD s/p CABG in 2006, PAD, prior pulmonary embolism in 2/14, breast CA, HTN, HLD, carotid artery disease and DM presents for BP follow up.   Lower extremity angiography in November 2016 showed bilateral popliteal artery occlusion. She was treated medically without revascularization.  She was admitted with [place] in 2/17 and the [facility] and S-Dx/OM were occluded.  She was tx with DES to proximal RCA (99%).  There was pLCx 50-75 that was neg by FFR.  Repeat LHC in 6/17 (CLite was intermediate risk), the RCA stent was patent with new 50 prox to stent. LCx was 50-60.  Med rx was continued.   Last Cardiac catheterization 11/19/15 for [place] demonstrated patent stent in the mid RCA. There was stable disease in the LCx. LIMA-LAD was patent. There was high-grade stenosis in the proximal RCA 95% which was treated with a Synergy DES. She was not placed on beta blocker secondary to bradycardia.  -Recently noted elevated BP. Increased lisinopril to 10mg  qd. Here today for follow up. Home blood pressure readings as 130-150/70-80s. BP here 106/60. Patient does have intermittent dizziness even before increasing lisinopril. She endorsed to having intermittent palpitation for the past few months. It occurs few times/weeks. Last for few hours. Questionable associated with stress/anxiety. Sometimes Xanax helps. Drink 3 cups of caffeinated caffeine every morning. Her palpitation mostly occurs afterwards. She has a stable dyspnea on exertion without angina. No lower extremity edema, orthopnea, PND or syncope.  Past Medical History:  Diagnosis Date  . Cancer of left breast ([place])   . Complication of anesthesia   . Coronary artery disease    a. 2006  s/p CABG x 4 (LIMA->LAD, VG->Diag->OM, VG->RPDA);  b. 02/2015: [place] s/p DES to [facility], VG->PDA & VG->D1->OM 100; c. 06/2015 Cath: diffuse dzs->Med rx; d. 11/2015 Cath/PCI: LM nl, LAD 100ost, 95d, D1 90ost, LCX 35ost, 65p, OM2 40, lat OM2 90, RCA 95p (2.75x16 Synergy DES), patent mid stent, RPLB 70, LIMA->LAD ok.EF55-65%.  . Depression   . Hx of tobacco use, presenting hazards to health    a. quit 2006.  [name] Hypercholesteremia   . Hypertension   . Hypertensive heart disease   . Left carotid bruit    a. 09/2014 Carotid U/S: 1-39% bilat ICA stenosis.  [name] PAD (peripheral artery disease) ([place])    a. 11/2014 ABI: R: 0.63, L 0.59;  b. 11/2014 Periph Angio: bilat pop occlusions. L - short w/ reconstituion via collats in dist pop w/ 2 vessel runoff, R long w/ reconstitution in prox tib/peroneal arteries-->med Rx w/ pletal.  . Pulmonary embolism ([place]) a. 2014.  [name] Reflux esophagitis   . Type II diabetes mellitus ([place])     Past Surgical History:  Procedure Laterality Date  . BREAST BIOPSY Left ~ 2012  . BUNIONECTOMY Left 1970s  . CARDIAC CATHETERIZATION  04/2004  . CARDIAC CATHETERIZATION N/A 02/21/2015   Procedure: Left Heart Cath and Cors/Grafts Angiography;  Surgeon: Belva Crome, MD;  Location: [facility];  Service: Cardiovascular;  Laterality: N/A;  . CARDIAC CATHETERIZATION N/A 02/21/2015   Procedure: Coronary Stent Intervention;  Surgeon: Belva Crome, MD;  Location: [place] [facility];  Service:  Cardiovascular;  Laterality: N/A;  . CARDIAC CATHETERIZATION N/A 02/21/2015   Procedure: Intravascular Pressure Wire/FFR Study;  Surgeon: Belva Crome, MD;  Location: [place] [facility];  Service: Cardiovascular;  Laterality: N/A;  . CARDIAC CATHETERIZATION N/A 06/20/2015   Procedure: Left Heart Cath and Cors/Grafts Angiography;  Surgeon: Larey Dresser, MD;  Location: [place] [facility];  Service: Cardiovascular;  Laterality: N/A;  . CARDIAC CATHETERIZATION N/A 11/19/2015   Procedure: Left Heart Cath and  Cors/Grafts Angiography;  Surgeon: Peter M Martinique, MD;  Location: [place] [facility];  Service: Cardiovascular;  Laterality: N/A;  . CARDIAC CATHETERIZATION N/A 11/19/2015   Procedure: Coronary Stent Intervention;  Surgeon: Peter M Martinique, MD;  Location: [place] [facility];  Service: Cardiovascular;  Laterality: N/A;  . CATARACT EXTRACTION W/ INTRAOCULAR LENS  IMPLANT, BILATERAL Bilateral 1980s-1990s  . CORONARY ANGIOPLASTY WITH STENT PLACEMENT  02/21/2015   "1 stent"  . CORONARY ARTERY BYPASS GRAFT  05/2004   "CABG X4"  . EYE SURGERY    . LAPAROSCOPIC CHOLECYSTECTOMY  1990s  . MASTECTOMY Left ~ 2012  . PERIPHERAL VASCULAR CATHETERIZATION N/A 12/12/2014   Procedure: Abdominal Aortogram;  Surgeon: Wellington Hampshire, MD;  Location: [place] [facility];  Service: Cardiovascular;  Laterality: N/A;  . RETINAL DETACHMENT SURGERY Right 1990s    Current Medications: Prior to Admission medications   Medication Sig Start Date End Date Taking? Authorizing Provider  acetaminophen (TYLENOL) 500 MG tablet Take 1,000 mg by mouth every 6 (six) hours as needed for headache (pain).     [provider]  ALPRAZolam Duanne Moron) 0.25 MG tablet Take 0.25 mg by mouth daily as needed for anxiety.     [provider]  aspirin 81 MG tablet Take 81 mg by mouth daily.    [provider]  BRILINTA 90 MG TABS tablet TAKE ONE TABLET BY MOUTH TWICE DAILY 03/02/16   Eileen Stanford, PA-C  brimonidine-timolol (COMBIGAN) 0.2-0.5 % ophthalmic solution Place 1 drop into both eyes 2 (two) times daily.    [provider]  dorzolamide (TRUSOPT) 2 % ophthalmic solution Place 1 drop into both eyes 2 (two) times daily.     [provider]  isosorbide mononitrate (IMDUR) 60 MG 24 hr tablet Take 1 tablet (60 mg total) by mouth at bedtime. 12/24/15   Richardson Dopp T, PA-C  lisinopril (PRINIVIL,ZESTRIL) 10 MG tablet Take 1 tablet (10 mg total) by mouth daily. 07/06/16   Nahser, Wonda Cheng, MD    metFORMIN (GLUCOPHAGE) 500 MG tablet Take 1,000 mg by mouth 2 (two) times daily with a meal.     [provider]  nitroGLYCERIN (NITROSTAT) 0.4 MG SL tablet Place 1 tablet (0.4 mg total) under the tongue every 5 (five) minutes as needed for chest pain (MAX 3 doses in 15 minutes). 11/20/15   Geradine Girt, DO  Polyethyl Glycol-Propyl Glycol (SYSTANE OP) Place 1 drop into both eyes daily as needed (dry eyes).    [provider]  rosuvastatin (CRESTOR) 20 MG tablet Take 1 tablet (20 mg total) by mouth daily at 6 PM. 12/19/15   Nahser, Wonda Cheng, MD  sertraline (ZOLOFT) 100 MG tablet Take 100 mg by mouth.     [provider]    Allergies:   Lipitor [atorvastatin] and Niacin   Social History   Social History  . Marital status: Married    Spouse name: N/A  . Number of children: N/A  . Years of education: N/A  Social History Main Topics  . Smoking status: Former Smoker    Packs/day: 0.50    Years: 15.00    Types: Cigarettes    Quit date: 05/11/2004  . Smokeless tobacco: Never Used  . Alcohol use No  . Drug use: No  . Sexual activity: Not Currently   Other Topics Concern  . None   Social History Narrative   Lives in [place] with her husband.     Family History:  The patient's family history includes Cancer in her sister; Coronary artery disease (age of onset: 44) in her sister; Coronary artery disease (age of onset: 74) in her mother; Deep vein thrombosis in her cousin; Heart attack (age of onset: 23) in her father.   ROS:   Please see the history of present illness.    ROS All other systems reviewed and are negative.   PHYSICAL EXAM:   VS:  BP 126/75 (BP Location: Right Arm, Cuff Size: Large)   Pulse 70   Ht 5\' 7"  (1.702 m)   Wt 166 lb (75.3 kg)   SpO2 97%   BMI 26.00 kg/m    GEN: Well nourished, well developed, in no acute distress  HEENT: normal  Neck: no JVD, carotid bruits, or masses Cardiac: RRR; no murmurs, rubs, or gallops Trace L  LE  edema with significant varicose pain Respiratory:  clear to auscultation bilaterally, normal work of breathing GI: soft, nontender, nondistended, + BS MS: no deformity or atrophy  Skin: warm and dry, no rash Neuro:  Alert and Oriented x 3, Strength and sensation are intact Psych: euthymic mood, full affect  Wt Readings from Last 3 Encounters:  07/22/16 166 lb (75.3 kg)  04/07/16 169 lb 9.6 oz (76.9 kg)  02/04/16 165 lb 1.9 oz (74.9 kg)      Studies/Labs Reviewed:   EKG:  EKG is not ordered today.   Recent Labs: 11/17/2015: TSH 2.162 01/23/2016: BUN 19.4; Creatinine 1.1; HGB 13.0; Platelets 273; Potassium 4.2; Sodium 140 01/27/2016: ALT 13   Lipid Panel    Component Value Date/Time   CHOL 86 (L) 01/27/2016 1034   TRIG 134 01/27/2016 1034   HDL 29 (L) 01/27/2016 1034   CHOLHDL 3.0 01/27/2016 1034   CHOLHDL 3.6 09/06/2015 0957   VLDL 20 09/06/2015 0957   LDLCALC 30 01/27/2016 1034    Additional studies/ records that were reviewed today include:   LHC 11/19/15 LM normal LAD ostial 100, distal 95, D1 ostial 90 LCx ostial 35, proximal-mid 65, OM2 40, lateral OM2 90 RCA proximal 95, mid stent patent RPDA filled by collaterals from second septal perforator RPLB1 70 LIMA-LAD normal EF 55-65 PCI: 2.75 x 16 mm Synergy DES to the proximal RCA 1. 3 vessel obstructive CAD- culprit lesion is in the mid RCA proximal to prior stent. Otherwise there is no change from prior angiogram in June. The moderate disease in the mid LCx is unchanged and prior FFR of this lesion in February was 0.84.  2. Patent LIMA to the LAD 3. Known occlusion of the SVG to RCA 4. Normal LV function.  5. Mildly elevated LVEDP 6. Successful stenting of the mid RCA with a DES (overlapping with prior stent)  Plan: Continue DAPT for one year. Aggressive risk factor modification. Given severe radial artery spasm I would recommend future coronary angiograms be performed by a femoral route.  Diagnostic Diagram        Post-Intervention Diagram         Carotid US 9/17 RICA  40-59 >> FU 1 year  Echo 8/14 Mild LVH, EF 60, normal wall motion, trivial MR, mild LAE, normal RVSF    ASSESSMENT & PLAN:    1. CAD s/p CABG and DES to RCA - No angina with stable dyspnea on exertion. No signs of heart failure. Continue aspirin, Brillinta, statin, Imdur and lisinopril.   2. HLD - continue statin   3. Carotid artery disease - follow as schedule  4. OSA on CPAP  5. PAD - followed by Dr. [name]  6. HTN - Elevated at home. Soft flow here. See below. Continue lisinopril 10 mg and Imdur 60 mg. Check BMET given recent increase in lisinopril.   7. DM - per PCP  8. Palpitations - Questionable related to anxiety and stress. Occasionally Xanax helps. Unable to add a beta blocker due to prior history of bradycardia. Complete cessation of caffeinated products. Check TSH. f ongoing palpitation will consider monitored at follow up.   9. Dizziness - Intermittent. She is not orthostatic on vital here. Advised to get balance.    Medication Adjustments/Labs and Tests Ordered: Current medicines are reviewed at length with the patient today.  Concerns regarding medicines are outlined above.  Medication changes, Labs and Tests ordered today are listed in the Patient Instructions below. Patient Instructions  Your physician recommends that you continue on your current medications as directed. Please refer to the Current Medication list given to you today.   Your physician recommends that you return for lab work in:  ADD  TSH   TO BMET  TODAY   Your physician recommends that you schedule a follow-up appointment in:  AUGUST  1 OR 2  WITH  VIN     Signed, [place], [place]  07/22/2016 10:14 AM    [address], [place], [place]  [address] Phone: [phone]; Fax: [phone]

## 2016-07-22 ENCOUNTER — Encounter: Payer: Self-pay | Admitting: Physician Assistant

## 2016-07-22 ENCOUNTER — Ambulatory Visit (INDEPENDENT_AMBULATORY_CARE_PROVIDER_SITE_OTHER): Payer: Medicare Other | Admitting: Physician Assistant

## 2016-07-22 VITALS — BP 126/75 | HR 70 | Ht 67.0 in | Wt 166.0 lb

## 2016-07-22 DIAGNOSIS — I2510 Atherosclerotic heart disease of native coronary artery without angina pectoris: Secondary | ICD-10-CM | POA: Diagnosis not present

## 2016-07-22 DIAGNOSIS — G4733 Obstructive sleep apnea (adult) (pediatric): Secondary | ICD-10-CM

## 2016-07-22 DIAGNOSIS — E785 Hyperlipidemia, unspecified: Secondary | ICD-10-CM | POA: Diagnosis not present

## 2016-07-22 DIAGNOSIS — I10 Essential (primary) hypertension: Secondary | ICD-10-CM

## 2016-07-22 DIAGNOSIS — Z79899 Other long term (current) drug therapy: Secondary | ICD-10-CM | POA: Diagnosis not present

## 2016-07-22 DIAGNOSIS — I6523 Occlusion and stenosis of bilateral carotid arteries: Secondary | ICD-10-CM | POA: Diagnosis not present

## 2016-07-22 DIAGNOSIS — R42 Dizziness and giddiness: Secondary | ICD-10-CM | POA: Diagnosis not present

## 2016-07-22 DIAGNOSIS — I739 Peripheral vascular disease, unspecified: Secondary | ICD-10-CM | POA: Diagnosis not present

## 2016-07-22 DIAGNOSIS — R002 Palpitations: Secondary | ICD-10-CM | POA: Diagnosis not present

## 2016-07-22 LAB — BASIC METABOLIC PANEL
BUN / CREAT RATIO: 18 (ref 12–28)
BUN: 18 mg/dL (ref 8–27)
CHLORIDE: 102 mmol/L (ref 96–106)
CO2: 19 mmol/L — ABNORMAL LOW (ref 20–29)
Calcium: 10 mg/dL (ref 8.7–10.3)
Creatinine, Ser: 1.01 mg/dL — ABNORMAL HIGH (ref 0.57–1.00)
GFR calc Af Amer: 66 mL/min/{1.73_m2} (ref >59)
GFR calc non Af Amer: 57 mL/min/{1.73_m2} — ABNORMAL LOW (ref >59)
Glucose: 119 mg/dL — ABNORMAL HIGH (ref 65–99)
Potassium: 4.7 mmol/L (ref 3.5–5.2)
SODIUM: 137 mmol/L (ref 134–144)

## 2016-07-22 LAB — TSH: TSH: 1.85 u[IU]/mL (ref 0.450–4.500)

## 2016-07-22 NOTE — Addendum Note (Signed)
Addended by: Eulis Foster on: 07/22/2016 10:18 AM   Modules accepted: Orders

## 2016-07-22 NOTE — Patient Instructions (Signed)
Your physician recommends that you continue on your current medications as directed. Please refer to the Current Medication list given to you today.   Your physician recommends that you return for lab work in:  ADD  TSH   TO BMET  TODAY   Your physician recommends that you schedule a follow-up appointment in:  AUGUST  1 OR 2  WITH  VIN

## 2016-07-27 ENCOUNTER — Other Ambulatory Visit: Payer: Medicare Other

## 2016-07-28 IMAGING — DX DG CHEST 2V
2 series · 2 of 2 positions shown · non-contrast
Comparison: 03/19/2009, correlation CT chest 02/29/2012

CLINICAL DATA: Central chest pressure and pain, LEFT arm pain,
symptoms off and on over past month, history of breast cancer post
LEFT mastectomy, coronary artery disease post MI and CABG,
hypertension, diabetes mellitus

EXAM:
CHEST  2 VIEW

[chest pa]
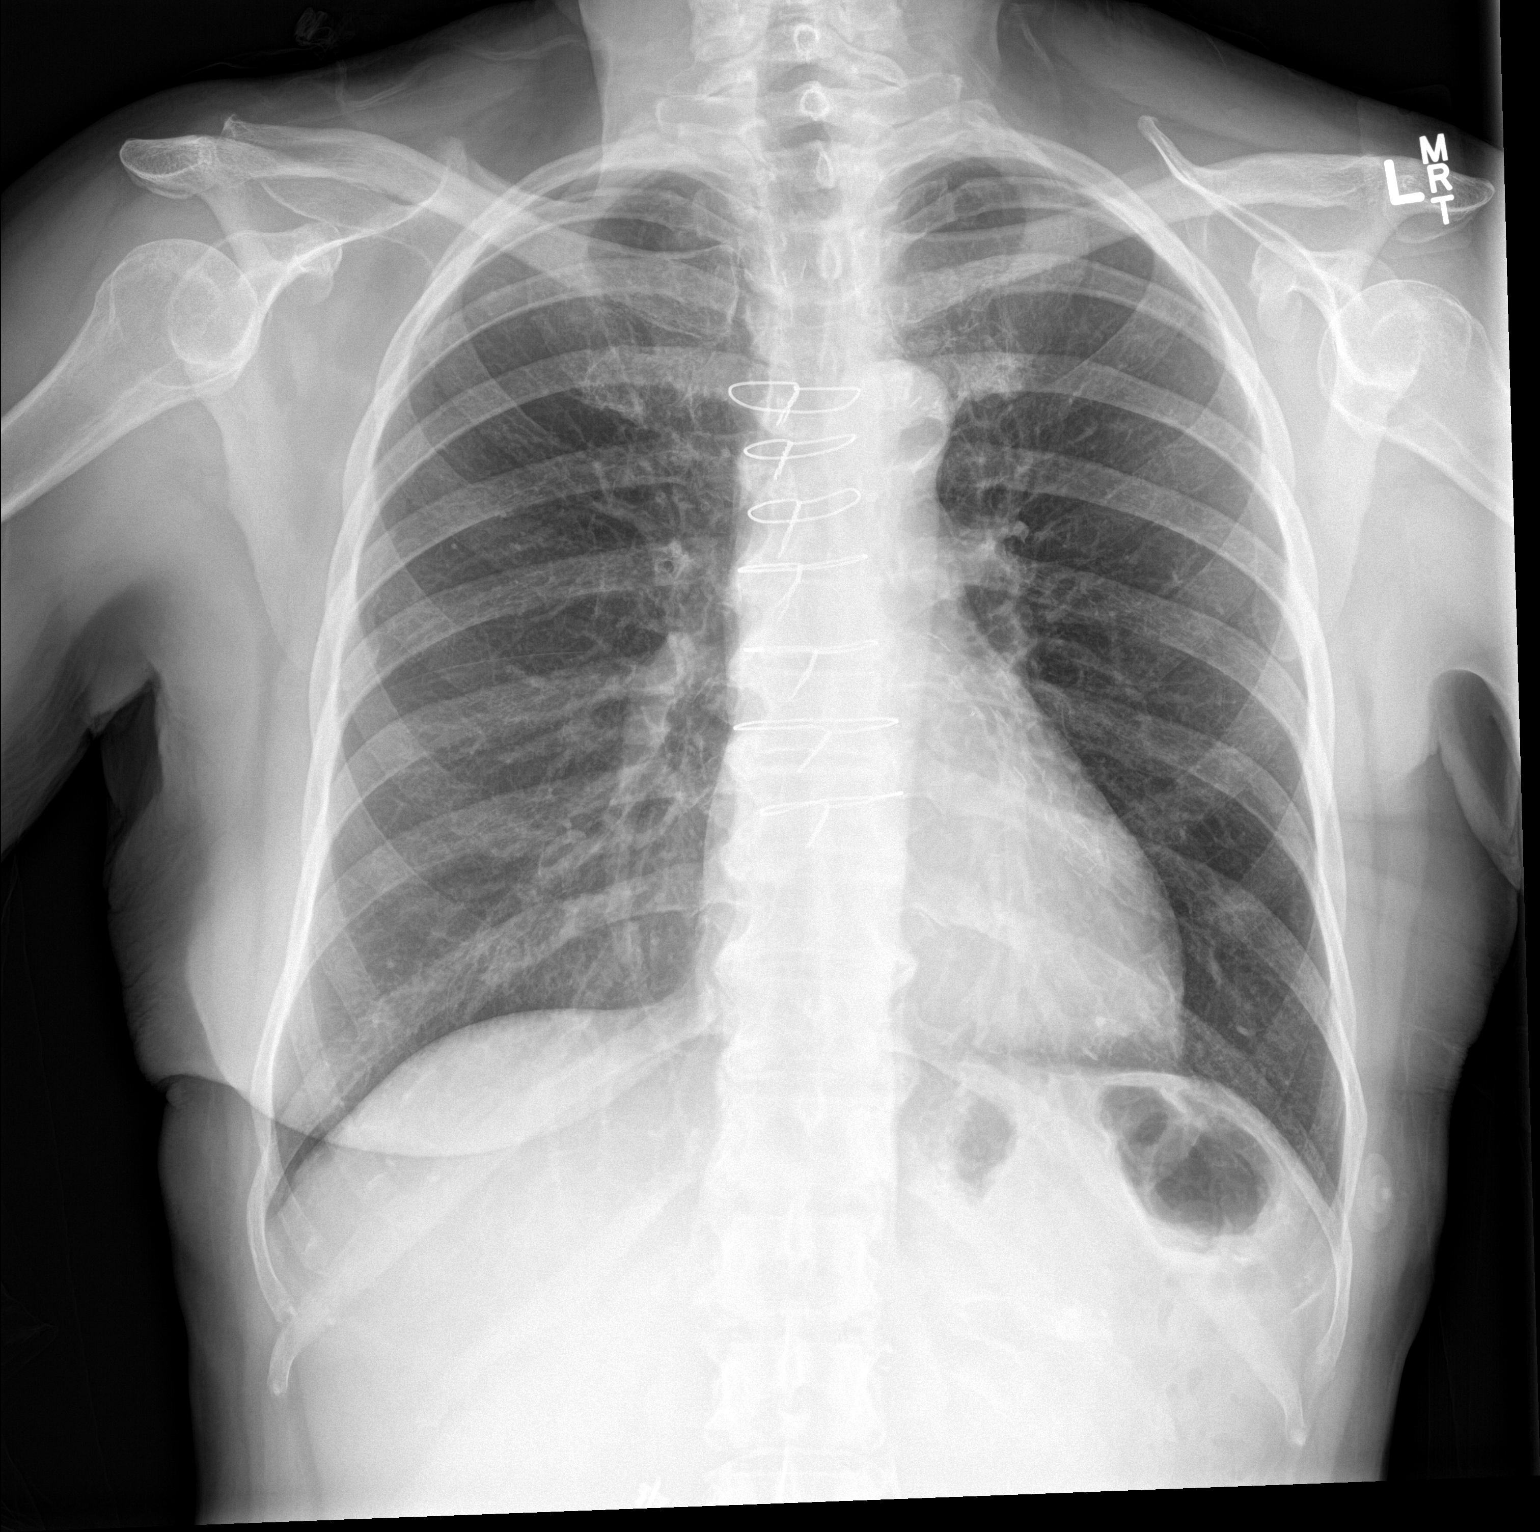

[chest lat]
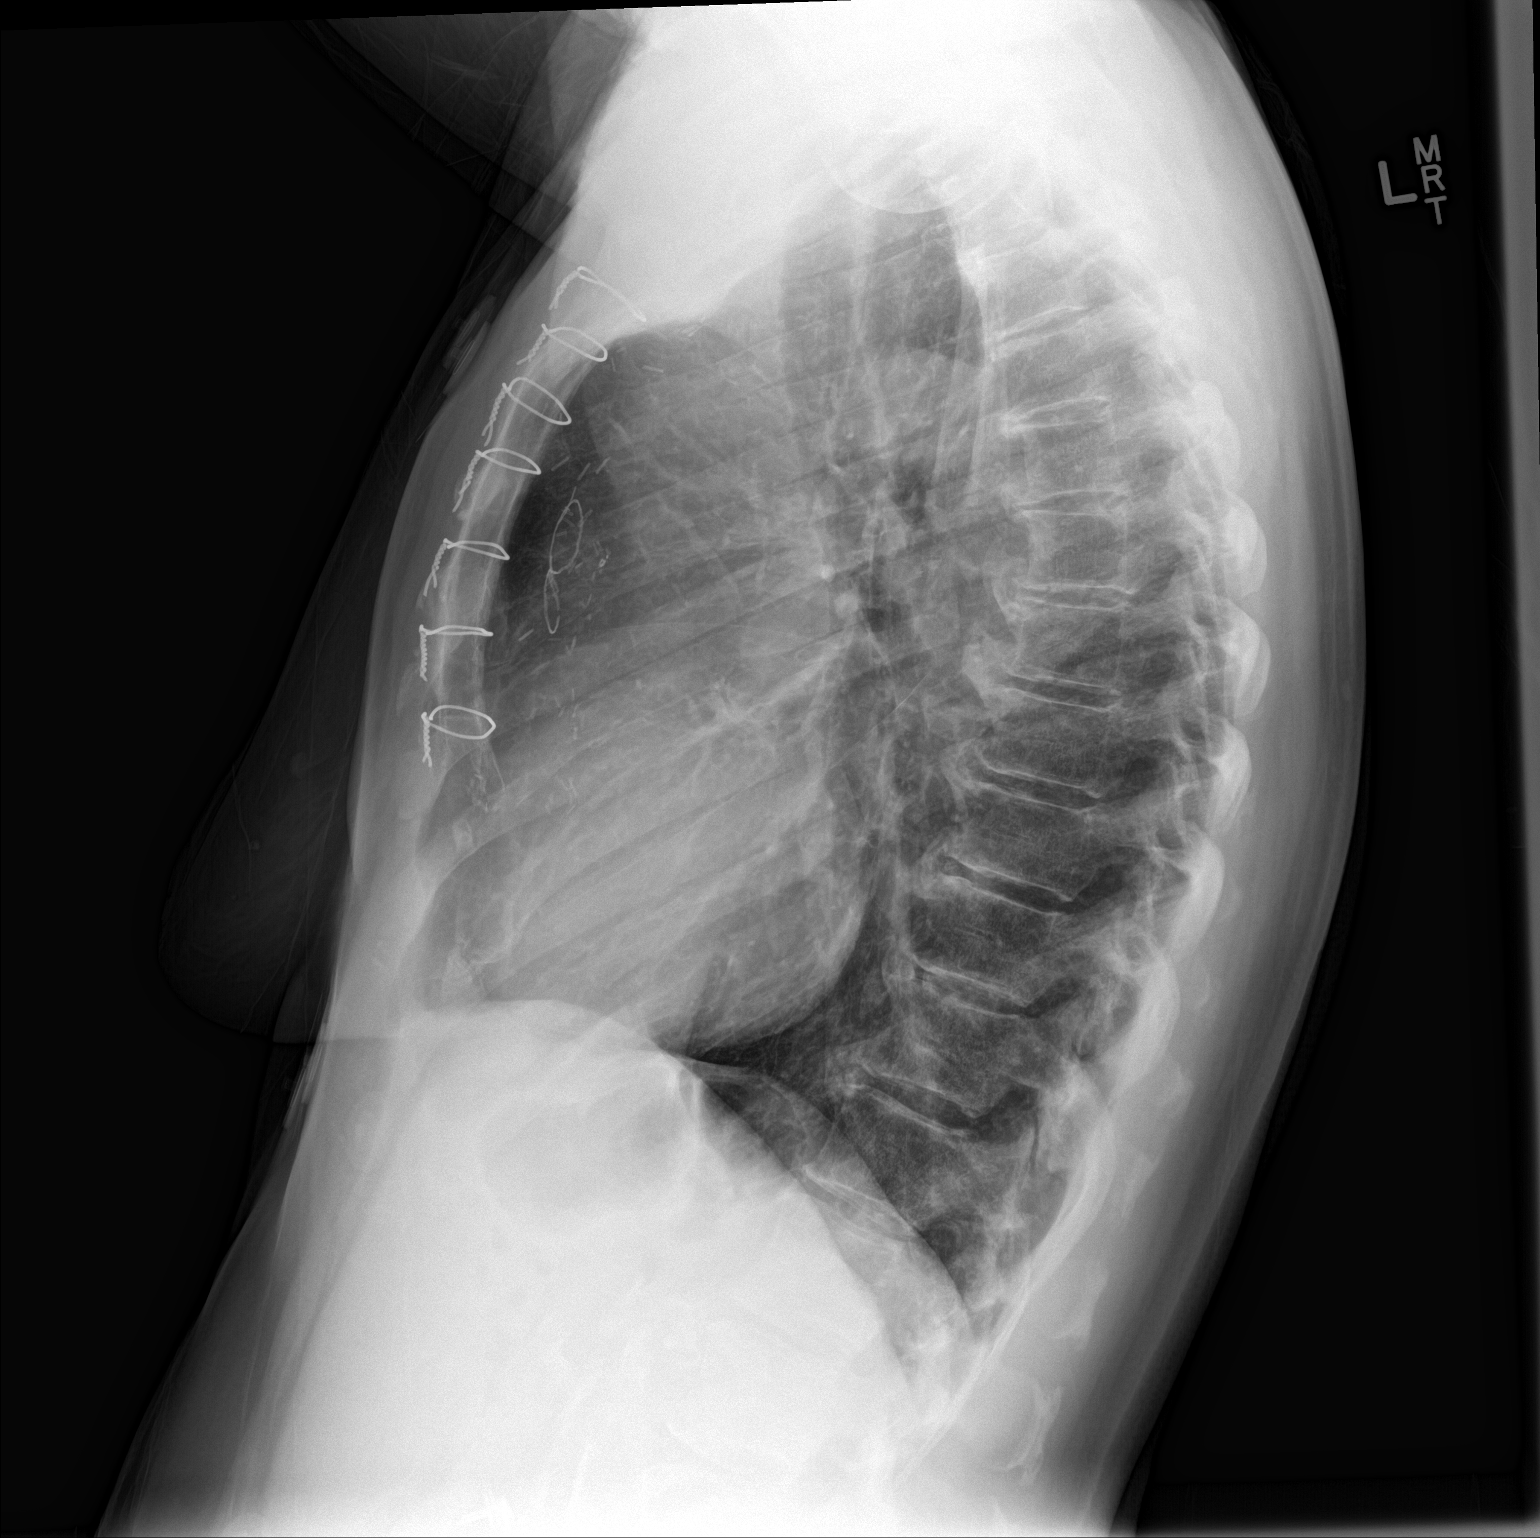

[2 of 2 positions shown; findings below may reference images not displayed]

FINDINGS: Normal heart size post median sternotomy.

Atherosclerotic calcification aorta.

Mediastinal contours and pulmonary vascularity normal.

Lungs hyperinflated but clear.

No pleural effusion or pneumothorax.

Scattered endplate spur formation thoracic spine.
IMPRESSION: Post CABG.

Hyperinflated lungs question COPD without acute infiltrate.

## 2016-07-29 ENCOUNTER — Ambulatory Visit (INDEPENDENT_AMBULATORY_CARE_PROVIDER_SITE_OTHER): Payer: Medicare Other | Admitting: Ophthalmology

## 2016-07-29 DIAGNOSIS — Z08 Encounter for follow-up examination after completed treatment for malignant neoplasm: Secondary | ICD-10-CM | POA: Diagnosis not present

## 2016-07-29 DIAGNOSIS — E11319 Type 2 diabetes mellitus with unspecified diabetic retinopathy without macular edema: Secondary | ICD-10-CM | POA: Diagnosis not present

## 2016-07-29 DIAGNOSIS — H35033 Hypertensive retinopathy, bilateral: Secondary | ICD-10-CM | POA: Diagnosis not present

## 2016-07-29 DIAGNOSIS — D3132 Benign neoplasm of left choroid: Secondary | ICD-10-CM

## 2016-07-29 DIAGNOSIS — I10 Essential (primary) hypertension: Secondary | ICD-10-CM | POA: Diagnosis not present

## 2016-07-29 DIAGNOSIS — H338 Other retinal detachments: Secondary | ICD-10-CM | POA: Diagnosis not present

## 2016-07-29 DIAGNOSIS — E113591 Type 2 diabetes mellitus with proliferative diabetic retinopathy without macular edema, right eye: Secondary | ICD-10-CM

## 2016-07-29 DIAGNOSIS — E113292 Type 2 diabetes mellitus with mild nonproliferative diabetic retinopathy without macular edema, left eye: Secondary | ICD-10-CM | POA: Diagnosis not present

## 2016-07-29 DIAGNOSIS — H43813 Vitreous degeneration, bilateral: Secondary | ICD-10-CM | POA: Diagnosis not present

## 2016-07-29 DIAGNOSIS — L820 Inflamed seborrheic keratosis: Secondary | ICD-10-CM | POA: Diagnosis not present

## 2016-07-29 DIAGNOSIS — Z85828 Personal history of other malignant neoplasm of skin: Secondary | ICD-10-CM | POA: Diagnosis not present

## 2016-08-05 DIAGNOSIS — H903 Sensorineural hearing loss, bilateral: Secondary | ICD-10-CM | POA: Diagnosis not present

## 2016-08-05 DIAGNOSIS — H9313 Tinnitus, bilateral: Secondary | ICD-10-CM | POA: Diagnosis not present

## 2016-08-12 ENCOUNTER — Ambulatory Visit (INDEPENDENT_AMBULATORY_CARE_PROVIDER_SITE_OTHER): Payer: Medicare Other | Admitting: Physician Assistant

## 2016-08-12 ENCOUNTER — Telehealth: Payer: Self-pay | Admitting: Physician Assistant

## 2016-08-12 ENCOUNTER — Encounter: Payer: Self-pay | Admitting: Physician Assistant

## 2016-08-12 VITALS — BP 118/60 | HR 60 | Ht 67.5 in | Wt 169.0 lb

## 2016-08-12 DIAGNOSIS — E785 Hyperlipidemia, unspecified: Secondary | ICD-10-CM | POA: Diagnosis not present

## 2016-08-12 DIAGNOSIS — I2510 Atherosclerotic heart disease of native coronary artery without angina pectoris: Secondary | ICD-10-CM | POA: Diagnosis not present

## 2016-08-12 DIAGNOSIS — I10 Essential (primary) hypertension: Secondary | ICD-10-CM | POA: Diagnosis not present

## 2016-08-12 DIAGNOSIS — R002 Palpitations: Secondary | ICD-10-CM

## 2016-08-12 DIAGNOSIS — T148XXA Other injury of unspecified body region, initial encounter: Secondary | ICD-10-CM

## 2016-08-12 DIAGNOSIS — I739 Peripheral vascular disease, unspecified: Secondary | ICD-10-CM

## 2016-08-12 DIAGNOSIS — R600 Localized edema: Secondary | ICD-10-CM

## 2016-08-12 DIAGNOSIS — I6523 Occlusion and stenosis of bilateral carotid arteries: Secondary | ICD-10-CM

## 2016-08-12 LAB — CBC
HEMATOCRIT: 36.7 % (ref 34.0–46.6)
Hemoglobin: 11.9 g/dL (ref 11.1–15.9)
MCH: 29.2 pg (ref 26.6–33.0)
MCHC: 32.4 g/dL (ref 31.5–35.7)
MCV: 90 fL (ref 79–97)
Platelets: 346 10*3/uL (ref 150–379)
RBC: 4.07 x10E6/uL (ref 3.77–5.28)
RDW: 13.2 % (ref 12.3–15.4)
WBC: 9.2 10*3/uL (ref 3.4–10.8)

## 2016-08-12 NOTE — Progress Notes (Signed)
Cardiology Office Note    Date:  08/12/2016   ID:  Taylor [name], DOB [birth year], MRN [id number]  PCP:  Taylor Cruel, MD  Cardiologist:  Dr. Acie [name] PV: Dr. Fletcher [name]    Chief Complaint: BP follow up   History of Present Illness:   Taylor [name] is a 71 y.o. female hx of CAD s/p CABG in 2006, PAD, prior pulmonary embolism in 2/14, breast CA, HTN, HLD, carotid artery disease and DM presents for BP follow up.   Lower extremity angiography in November 2016 showed bilateral popliteal artery occlusion. She was treated medically withoutrevascularization. She was admitted with [place] in 2/17 and the [facility] and S-Dx/OM were occluded. She was tx with DES to proximal RCA (99%). There was pLCx 50-75 that was neg by FFR. Repeat LHC in 6/17 (CLite was intermediate risk), the RCA stent was patent with new 50 prox to stent. LCx was 50-60. Med rx was continued.   Last Cardiac catheterization 11/19/15 for [place] demonstrated patent stent in the mid RCA. There was stable disease in the LCx. LIMA-LAD was patent. There was high-grade stenosis in the proximal RCA 95% which was treated with a Synergy DES. She was not placed on beta blocker secondary to bradycardia.  Patient to increase lisinopril due to elevated blood pressure. Evaluated by me 07/22/16. He had intermittent dizziness & palpitations. Not orthostatic on vitals. Felt his symptoms were likely related to stress/anxiety. Advise complete cessation of caffeine as palpitation mostly occurs after drinking caffeinated products.  She is here for follow-up. Palpitation has improved somewhat with cessation of caffeine products. However, still has intermittent heart racing whenever she is anxious. This resolved with taking Xanax. She had a mechanical fall last week and injured her left leg. She had a significant bruising and swelling, now improved. She denies chest pain, shortness of breath, orthopnea, PND, syncope, melena or dizziness.    Past Medical  History:  Diagnosis Date  . Cancer of left breast ([place])   . Complication of anesthesia   . Coronary artery disease    a. 2006 s/p CABG x 4 (LIMA->LAD, VG->Diag->OM, VG->RPDA);  b. 02/2015: [place] s/p DES to [facility], VG->PDA & VG->D1->OM 100; c. 06/2015 Cath: diffuse dzs->Med rx; d. 11/2015 Cath/PCI: LM nl, LAD 100ost, 95d, D1 90ost, LCX 35ost, 65p, OM2 40, lat OM2 90, RCA 95p (2.75x16 Synergy DES), patent mid stent, RPLB 70, LIMA->LAD ok.EF55-65%.  . Depression   . Hx of tobacco use, presenting hazards to health    a. quit 2006.  [name] Hypercholesteremia   . Hypertension   . Hypertensive heart disease   . Left carotid bruit    a. 09/2014 Carotid U/S: 1-39% bilat ICA stenosis.  [name] PAD (peripheral artery disease) ([place])    a. 11/2014 ABI: R: 0.63, L 0.59;  b. 11/2014 Periph Angio: bilat pop occlusions. L - short w/ reconstituion via collats in dist pop w/ 2 vessel runoff, R long w/ reconstitution in prox tib/peroneal arteries-->med Rx w/ pletal.  . Pulmonary embolism ([place]) a. 2014.  [name] Reflux esophagitis   . Type II diabetes mellitus ([place])     Past Surgical History:  Procedure Laterality Date  . BREAST BIOPSY Left ~ 2012  . BUNIONECTOMY Left 1970s  . CARDIAC CATHETERIZATION  04/2004  . CARDIAC CATHETERIZATION N/A 02/21/2015   Procedure: Left Heart Cath and Cors/Grafts Angiography;  Surgeon: Taylor Crome, MD;  Location: [place] [facility];  Service: Cardiovascular;  Laterality: N/A;  . CARDIAC CATHETERIZATION N/A 02/21/2015  Procedure: Coronary Stent Intervention;  Surgeon: Taylor Crome, MD;  Location: [place] [facility];  Service: Cardiovascular;  Laterality: N/A;  . CARDIAC CATHETERIZATION N/A 02/21/2015   Procedure: Intravascular Pressure Wire/FFR Study;  Surgeon: Taylor Crome, MD;  Location: [place] [facility];  Service: Cardiovascular;  Laterality: N/A;  . CARDIAC CATHETERIZATION N/A 06/20/2015   Procedure: Left Heart Cath and Cors/Grafts Angiography;  Surgeon: Taylor Dresser, MD;  Location: [place] [facility];  Service: Cardiovascular;  Laterality: N/A;  . CARDIAC CATHETERIZATION N/A 11/19/2015   Procedure: Left Heart Cath and Cors/Grafts Angiography;  Surgeon: Taylor M Martinique, MD;  Location: [place] [facility];  Service: Cardiovascular;  Laterality: N/A;  . CARDIAC CATHETERIZATION N/A 11/19/2015   Procedure: Coronary Stent Intervention;  Surgeon: Taylor M [place]ique, MD;  Location: [place] [facility];  Service: Cardiovascular;  Laterality: N/A;  . CATARACT EXTRACTION W/ INTRAOCULAR LENS  IMPLANT, BILATERAL Bilateral 1980s-1990s  . CORONARY ANGIOPLASTY WITH STENT PLACEMENT  02/21/2015   "1 stent"  . CORONARY ARTERY BYPASS GRAFT  05/2004   "CABG X4"  . EYE SURGERY    . LAPAROSCOPIC CHOLECYSTECTOMY  1990s  . MASTECTOMY Left ~ 2012  . PERIPHERAL VASCULAR CATHETERIZATION N/A 12/12/2014   Procedure: Abdominal Aortogram;  Surgeon: Taylor Hampshire, MD;  Location: [place] [facility];  Service: Cardiovascular;  Laterality: N/A;  . RETINAL DETACHMENT SURGERY Right 1990s    Current Medications: Prior to Admission medications   Medication Sig Start Date End Date Taking? Authorizing Provider  acetaminophen (TYLENOL) 500 MG tablet Take 1,000 mg by mouth every 6 (six) hours as needed for headache (pain).     [provider]  ALPRAZolam Duanne Moron) 0.25 MG tablet Take 0.25 mg by mouth daily as needed for anxiety.     [provider]  aspirin 81 MG tablet Take 81 mg by mouth daily.    [provider]  BRILINTA 90 MG TABS tablet TAKE ONE TABLET BY MOUTH TWICE DAILY 03/02/16   Taylor Stanford, PA-C  dorzolamide (TRUSOPT) 2 % ophthalmic solution Place 1 drop into both eyes 2 (two) times daily.     [provider]  isosorbide mononitrate (IMDUR) 60 MG 24 hr tablet Take 1 tablet (60 mg total) by mouth at bedtime. 12/24/15   Taylor Dopp T, PA-C  lisinopril (PRINIVIL,ZESTRIL) 10 MG tablet Take 1 tablet (10 mg total) by mouth daily. 07/06/16   Nahser, Wonda Cheng, MD  metFORMIN  (GLUCOPHAGE) 500 MG tablet Take 1,000 mg by mouth 2 (two) times daily with a meal.     [provider]  nitroGLYCERIN (NITROSTAT) 0.4 MG SL tablet Place 1 tablet (0.4 mg total) under the tongue every 5 (five) minutes as needed for chest pain (MAX 3 doses in 15 minutes). 11/20/15   Geradine Girt, DO  Polyethyl Glycol-Propyl Glycol (SYSTANE OP) Place 1 drop into both eyes daily as needed (dry eyes).    [provider]  rosuvastatin (CRESTOR) 20 MG tablet Take 1 tablet (20 mg total) by mouth daily at 6 PM. 12/19/15   Nahser, Wonda Cheng, MD  sertraline (ZOLOFT) 100 MG tablet Take 100 mg by mouth.     [provider]    Allergies:   Niacin and Lipitor [atorvastatin]   Social History   Social History  . Marital status: Married    Spouse name: N/A  . Number of children: N/A  . Years of education: N/A   Social History Main Topics  .  Smoking status: Former Smoker    Packs/day: 0.50    Years: 15.00    Types: Cigarettes    Quit date: 05/11/2004  . Smokeless tobacco: Never Used  . Alcohol use No  . Drug use: No  . Sexual activity: Not Currently   Other Topics Concern  . None   Social History Narrative   Lives in [place] with her husband.     Family History:  The patient's family history includes Cancer in her sister; Coronary artery disease (age of onset: 59) in her sister; Coronary artery disease (age of onset: 72) in her mother; Deep vein thrombosis in her cousin; Heart attack (age of onset: 87) in her father.   ROS:   Please see the history of present illness.    ROS All other systems reviewed and are negative.   PHYSICAL EXAM:   VS:  BP 118/60   Pulse 60   Ht 5' 7.5" (1.715 m)   Wt 169 lb (76.7 kg)   BMI 26.08 kg/m    GEN: Well nourished, well developed, in no acute distress  HEENT: normal  Neck: no JVD, carotid bruits, or masses Cardiac:RRR; no murmurs, rubs, or gallops, Respiratory:  clear to auscultation bilaterally, normal work of  breathing GI: soft, nontender, nondistended, + BS MS: no deformity or atrophy  Skin: warm and dry. LLE has significant bruising with edema  Neuro:  Alert and Oriented x 3, Strength and sensation are intact Psych: euthymic mood, full affect  Wt Readings from Last 3 Encounters:  08/12/16 169 lb (76.7 kg)  07/22/16 166 lb (75.3 kg)  04/07/16 169 lb 9.6 oz (76.9 kg)      Studies/Labs Reviewed:   EKG:  EKG is not ordered today.    Recent Labs: 01/23/2016: HGB 13.0; Platelets 273 01/27/2016: ALT 13 07/22/2016: BUN 18; Creatinine, Ser 1.01; Potassium 4.7; Sodium 137; TSH 1.850   Lipid Panel    Component Value Date/Time   CHOL 86 (L) 01/27/2016 1034   TRIG 134 01/27/2016 1034   HDL 29 (L) 01/27/2016 1034   CHOLHDL 3.0 01/27/2016 1034   CHOLHDL 3.6 09/06/2015 0957   VLDL 20 09/06/2015 0957   LDLCALC 30 01/27/2016 1034    Additional studies/ records that were reviewed today include:   LHC 11/19/15 LM normal LAD ostial 100, distal 95, D1 ostial 90 LCx ostial 35, proximal-mid 65, OM2 40, lateral OM2 90 RCA proximal 95, mid stent patent RPDA filled by collaterals from second septal perforator RPLB1 70 LIMA-LAD normal EF 55-65 PCI: 2.75 x 16 mm Synergy DES to the proximal RCA 1. 3 vessel obstructive CAD- culprit lesion is in the mid RCA proximal to prior stent. Otherwise there is no change from prior angiogram in June. The moderate disease in the mid LCx is unchanged and prior FFR of this lesion in February was 0.84.  2. Patent LIMA to the LAD 3. Known occlusion of the SVG to RCA 4. Normal LV function.  5. Mildly elevated LVEDP 6. Successful stenting of the mid RCA with a DES (overlapping with prior stent)  Plan: Continue DAPT for one year. Aggressive risk factor modification. Given severe radial artery spasm I would recommend future coronary angiograms be performed by a femoral route.  Diagnostic Diagram       Post-Intervention Diagram         Carotid US  9/17 RICA 40-59 >> FU 1 year  Echo 8/14 Mild LVH, EF 60, normal wall motion, trivial MR, mild LAE, normal RVSF  ASSESSMENT & PLAN:    1. CAD s/p CABG and DES to RCA - No angina with stable dyspnea on exertion. No signs of heart failure. Continue aspirin, Brillinta, statin, Imdur and lisinopril.   2. HLD - continue statin   3. Carotid artery disease - follow as schedule  4.  PAD - followed by Dr. Fletcher [name]  5. Palpitation - Likely related to//anxiety. No further evaluation at needed this time.  6. HTN - Stable and well controlled on current regimen.  7. Left lower extremity bruising and edema - After mechanical fall. Improving. No pain while walking. Check CBC. She will follow-up with PCP if no improvement or worsening symptoms.     Medication Adjustments/Labs and Tests Ordered: Current medicines are reviewed at length with the patient today.  Concerns regarding medicines are outlined above.  Medication changes, Labs and Tests ordered today are listed in the Patient Instructions below. Patient Instructions  Your physician recommends that you continue on your current medications as directed. Please refer to the Current Medication list given to you today.  Your physician recommends that you return for lab work today (CBC)  Your physician recommends that you keep your scheduled follow up with Dr. Acie [name].     Taylor [name], [place]  08/12/2016 10:43 AM    [facility] [place], [place], [place]  [address] Phone: [phone]; Fax: [phone]

## 2016-08-12 NOTE — Telephone Encounter (Signed)
Notes recorded by Leanor Kail, PA on 08/12/2016 at 3:31 PM EDT Normal labs.  Patient notified.

## 2016-08-12 NOTE — Patient Instructions (Signed)
Your physician recommends that you continue on your current medications as directed. Please refer to the Current Medication list given to you today.  Your physician recommends that you return for lab work today (CBC)  Your physician recommends that you keep your scheduled follow up with Dr. Acie Fredrickson.

## 2016-08-12 NOTE — Telephone Encounter (Signed)
Pt returned call for her lab results please give her a call back. °

## 2016-09-09 DIAGNOSIS — E1142 Type 2 diabetes mellitus with diabetic polyneuropathy: Secondary | ICD-10-CM | POA: Diagnosis not present

## 2016-09-09 DIAGNOSIS — I739 Peripheral vascular disease, unspecified: Secondary | ICD-10-CM | POA: Diagnosis not present

## 2016-09-09 DIAGNOSIS — F419 Anxiety disorder, unspecified: Secondary | ICD-10-CM | POA: Diagnosis not present

## 2016-09-09 DIAGNOSIS — Z7984 Long term (current) use of oral hypoglycemic drugs: Secondary | ICD-10-CM | POA: Diagnosis not present

## 2016-09-09 DIAGNOSIS — I10 Essential (primary) hypertension: Secondary | ICD-10-CM | POA: Diagnosis not present

## 2016-09-09 DIAGNOSIS — R5383 Other fatigue: Secondary | ICD-10-CM | POA: Diagnosis not present

## 2016-09-09 DIAGNOSIS — Z79899 Other long term (current) drug therapy: Secondary | ICD-10-CM | POA: Diagnosis not present

## 2016-09-09 DIAGNOSIS — E1165 Type 2 diabetes mellitus with hyperglycemia: Secondary | ICD-10-CM | POA: Diagnosis not present

## 2016-09-09 DIAGNOSIS — E7800 Pure hypercholesterolemia, unspecified: Secondary | ICD-10-CM | POA: Diagnosis not present

## 2016-09-09 DIAGNOSIS — Z0000 Encounter for general adult medical examination without abnormal findings: Secondary | ICD-10-CM | POA: Diagnosis not present

## 2016-09-09 DIAGNOSIS — M25569 Pain in unspecified knee: Secondary | ICD-10-CM | POA: Diagnosis not present

## 2016-09-15 ENCOUNTER — Telehealth: Payer: Self-pay | Admitting: Physician Assistant

## 2016-09-15 NOTE — Telephone Encounter (Signed)
Patient calling the office for samples of medication:   1.  What medication and dosage are you requesting samples for?  berlita 90mg    2.  Are you currently out of this medication? Two more left

## 2016-09-15 NOTE — Telephone Encounter (Signed)
Patient returned my call and she stated that she is requesting samples as she is almost out of brilinta and she thinks that she may be coming off of this medication soon. She is aware that I will place samples at the front desk.

## 2016-09-15 NOTE — Telephone Encounter (Signed)
Follow up    Pt missed your call

## 2016-09-15 NOTE — Telephone Encounter (Signed)
Returned patients call. Again, no answer on either number listed.

## 2016-09-16 ENCOUNTER — Ambulatory Visit
Admission: RE | Admit: 2016-09-16 | Discharge: 2016-09-16 | Disposition: A | Discharge status: Home / Self Care | Payer: Medicare Other | Source: Ambulatory Visit | Attending: Adult Health | Admitting: Adult Health

## 2016-09-16 DIAGNOSIS — Z1231 Encounter for screening mammogram for malignant neoplasm of breast: Secondary | ICD-10-CM | POA: Diagnosis not present

## 2016-09-21 ENCOUNTER — Ambulatory Visit (INDEPENDENT_AMBULATORY_CARE_PROVIDER_SITE_OTHER): Payer: Medicare Other | Admitting: Cardiovascular Disease

## 2016-09-21 ENCOUNTER — Encounter: Payer: Self-pay | Admitting: Cardiovascular Disease

## 2016-09-21 VITALS — BP 142/60 | HR 56 | Ht 67.5 in | Wt 170.4 lb

## 2016-09-21 DIAGNOSIS — R0989 Other specified symptoms and signs involving the circulatory and respiratory systems: Secondary | ICD-10-CM | POA: Diagnosis not present

## 2016-09-21 DIAGNOSIS — I2510 Atherosclerotic heart disease of native coronary artery without angina pectoris: Secondary | ICD-10-CM

## 2016-09-21 DIAGNOSIS — I739 Peripheral vascular disease, unspecified: Secondary | ICD-10-CM

## 2016-09-21 DIAGNOSIS — I6523 Occlusion and stenosis of bilateral carotid arteries: Secondary | ICD-10-CM

## 2016-09-21 DIAGNOSIS — I10 Essential (primary) hypertension: Secondary | ICD-10-CM | POA: Diagnosis not present

## 2016-09-21 DIAGNOSIS — E782 Mixed hyperlipidemia: Secondary | ICD-10-CM | POA: Diagnosis not present

## 2016-09-21 MED ORDER — CLOPIDOGREL BISULFATE 75 MG PO TABS: 75.0000 mg | ORAL_TABLET | Freq: Every day | ORAL | 3 refills | Status: DC

## 2016-09-21 NOTE — Patient Instructions (Addendum)
Medication Instructions:  STOP Brilinta when you finish with your current Rx THEN START Plavix 75 mg once daily   Labwork: None Ordered   Testing/Procedures: Your physician has requested that you have a carotid duplex. This test is an ultrasound of the carotid arteries in your neck. It looks at blood flow through these arteries that supply the brain with blood. Allow one hour for this exam. There are no restrictions or special instructions.    Follow-Up: Your physician wants you to follow-up in: 6 months with Dr. Acie Fredrickson.  You will receive a reminder letter in the mail two months in advance. If you don't receive a letter, please call our office to schedule the follow-up appointment.   If you need a refill on your cardiac medications before your next appointment, please call your pharmacy.   Thank you for choosing [facility]! Christen Bame, RN [phone]

## 2016-09-21 NOTE — Progress Notes (Signed)
Cardiology Office Note:    Date:  09/21/2016   ID:  Taylor [name], DOB 03/10/1945, MRN [id number]  PCP:  Lawerance Cruel, MD  Cardiologist:  Dr. Loralie Champagne , Berneice Gandy >> Dr. Liam Rogers   Electrophysiologist:  n/a  Referring MD: Lawerance Cruel, MD   Chief Complaint  Patient presents with  . Follow-up    CAD    History of Present Illness:    Taylor [name] is a 71 y.o. female with a hx of CAD s/p CABG in 2006, PAD, prior pulmonary embolism in 2/14, breast CA.  She has known bilateral popliteal artery occlusions that are not ideal for intervention.  She was admitted with [place] in 2/17 and the [facility] and S-Dx/OM were occluded.  She was tx with DES to proximal RCA (99%).  There was pLCx 50-75 that was neg by FFR.  Repeat LHC in 6/17 (CLite was intermediate risk), the RCA stent was patent with new 50 prox to stent.  LCx was 50-60.  Med rx was continued.  Last seen by Dr. Loralie Champagne in 6/17.    She was admitted 11/5-11/8 with unstable angina. Chest CTA was negative for pulmonary embolism. Cardiac enzymes remained negative for myocardial infarction. She was evaluated by cardiology. Cardiac catheterization demonstrated patent stent in the mid RCA. There was stable disease in the LCx. LIMA-LAD was patent. There was high-grade stenosis in the proximal RCA 95% which was treated with a Synergy DES. She was not placed on beta blocker secondary to bradycardia. Statin therapy was adjusted to Crestor. She returns for post hosp follow up.  She is here today with her husband. She has been doing well since DC. She denies chest discomfort or significant dyspnea. She does get short of breath with more extreme activities. She denies orthopnea, PND. She has chronic LE edema especially on the L without significant change. She denies syncope. She denies bleeding issues. She has noted some emotional lability since her recent PCI.  Prior CV studies that were reviewed today include:    LHC 11/19/15 LM  normal LAD ostial 100, distal 95, D1 ostial 90 LCx ostial 35, proximal-mid 65, OM2 40, lateral OM2 90 RCA proximal 95, mid stent patent RPDA filled by collaterals from second septal perforator RPLB1 70 LIMA-LAD normal EF 55-65 PCI: 2.75 x 16 mm Synergy DES to the proximal RCA 1. 3 vessel obstructive CAD- culprit lesion is in the mid RCA proximal to prior stent. Otherwise there is no change from prior angiogram in June. The moderate disease in the mid LCx is unchanged and prior FFR of this lesion in February was 0.84.  2. Patent LIMA to the LAD 3. Known occlusion of the SVG to RCA 4. Normal LV function.  5. Mildly elevated LVEDP 6. Successful stenting of the mid RCA with a DES (overlapping with prior stent)  Plan: Continue DAPT for one year. Aggressive risk factor modification. Given severe radial artery spasm I would recommend future coronary angiograms be performed by a femoral route.  Carotid US 9/17 RICA 40-59 >> FU 1 year  Echo 8/14 Mild LVH, EF 60, normal wall motion, trivial MR, mild LAE, normal RVSF  Jan. 23, 2018:  Seen for the first time in the office.   I met her during a hospitalization in Nov. 2017. She has not felt well.   Was having headaches - likely due to Imdur.  Cath in Nov. Shows small vessel disease in addition to an RCA culprit vessel that was  stented.  Lipids are optimal  Non smoker  Does not exercise.   Gets out of breath . Has PVD also  Was on Pletal but this was stopped when she was started on Brilinta.     Has a remote history of pulmonary embolus  In 2014. She was previously on Coumadin but this was stopped by her hematologist. She's not had any recurrent pulmonary emboli since 2014.  Sept. 10, 2018:  Taylor [name] is seen today  - hx of CABG, hx of PE , HTN, HLD, breast cancer  Has had some orthopedic issues.  Has had right knee / behind the knee pain .   No CP or dyspnea   Past Medical History:  Diagnosis Date  . Cancer of left breast ([place])   .  Complication of anesthesia   . Coronary artery disease    a. 2006 s/p CABG x 4 (LIMA->LAD, VG->Diag->OM, VG->RPDA);  b. 02/2015: [place] s/p DES to [facility], VG->PDA & VG->D1->OM 100; c. 06/2015 Cath: diffuse dzs->Med rx; d. 11/2015 Cath/PCI: LM nl, LAD 100ost, 95d, D1 90ost, LCX 35ost, 65p, OM2 40, lat OM2 90, RCA 95p (2.75x16 Synergy DES), patent mid stent, RPLB 70, LIMA->LAD ok.EF55-65%.  . Depression   . Hx of tobacco use, presenting hazards to health    a. quit 2006.  [name] Hypercholesteremia   . Hypertension   . Hypertensive heart disease   . Left carotid bruit    a. 09/2014 Carotid U/S: 1-39% bilat ICA stenosis.  [name] PAD (peripheral artery disease) ([place])    a. 11/2014 ABI: R: 0.63, L 0.59;  b. 11/2014 Periph Angio: bilat pop occlusions. L - short w/ reconstituion via collats in dist pop w/ 2 vessel runoff, R long w/ reconstitution in prox tib/peroneal arteries-->med Rx w/ pletal.  . Pulmonary embolism ([place]) a. 2014.  [name] Reflux esophagitis   . Type II diabetes mellitus ([place])   1. Depression 2. Breast cancer: s/p left mastectomy in 3/11.  3. PE 6/59: Uncertain etiology, no trigger.  Factor V Leiden negative, prothrombin gene mutation negative, but lupus anticoagulant positive.  Venous dopplers (8/14) with left greater saphenous vein superficial thrombosis.  Repeat antiphospholipid antibody workup negative in 8/14 and again in 1/15.  4. Hyperlipidemia: Myalgias with Crestor and atorvastatin.  5. Type II diabetes 6. HTN 7. Cholescystectomy 8. GERD 9. CAD: s/p CABG in 5/06 with LIMA-LAD, sequential   10. Carotid stenosis: carotids (8/14) with 40-59% bilateral ICA stenosis. Carotid dopplers (9/15) with 1-39% BICA stenosis.  11. Glaucoma 12. PAD: ABIs (10/16) 0.63 R, 0.59 L.  Peripheral angiography [name]) 11/16 with bilateral popliteal occlusion => lesion not optimal for PCI, so cilostazol started (later stopped with need for DAPT).    Past Surgical History:  Procedure Laterality Date  . BREAST BIOPSY  Left ~ 2012  . BUNIONECTOMY Left 1970s  . CARDIAC CATHETERIZATION  04/2004  . CARDIAC CATHETERIZATION N/A 02/21/2015   Procedure: Left Heart Cath and Cors/Grafts Angiography;  Surgeon: Belva Crome, MD;  Location: [place] [facility];  Service: Cardiovascular;  Laterality: N/A;  . CARDIAC CATHETERIZATION N/A 02/21/2015   Procedure: Coronary Stent Intervention;  Surgeon: Belva Crome, MD;  Location: [place] [facility];  Service: Cardiovascular;  Laterality: N/A;  . CARDIAC CATHETERIZATION N/A 02/21/2015   Procedure: Intravascular Pressure Wire/FFR Study;  Surgeon: Belva Crome, MD;  Location: [place] [facility];  Service: Cardiovascular;  Laterality: N/A;  . CARDIAC CATHETERIZATION N/A 06/20/2015   Procedure: Left Heart Cath and Cors/Grafts Angiography;  Surgeon: Larey Dresser, MD;  Location: [place] [facility];  Service: Cardiovascular;  Laterality: N/A;  . CARDIAC CATHETERIZATION N/A 11/19/2015   Procedure: Left Heart Cath and Cors/Grafts Angiography;  Surgeon: Peter M Martinique, MD;  Location: [place] [facility];  Service: Cardiovascular;  Laterality: N/A;  . CARDIAC CATHETERIZATION N/A 11/19/2015   Procedure: Coronary Stent Intervention;  Surgeon: Peter M Martinique, MD;  Location: [place] [facility];  Service: Cardiovascular;  Laterality: N/A;  . CATARACT EXTRACTION W/ INTRAOCULAR LENS  IMPLANT, BILATERAL Bilateral 1980s-1990s  . CORONARY ANGIOPLASTY WITH STENT PLACEMENT  02/21/2015   "1 stent"  . CORONARY ARTERY BYPASS GRAFT  05/2004   "CABG X4"  . EYE SURGERY    . LAPAROSCOPIC CHOLECYSTECTOMY  1990s  . MASTECTOMY Left ~ 2012  . PERIPHERAL VASCULAR CATHETERIZATION N/A 12/12/2014   Procedure: Abdominal Aortogram;  Surgeon: Wellington Hampshire, MD;  Location: [place] [facility];  Service: Cardiovascular;  Laterality: N/A;  . RETINAL DETACHMENT SURGERY Right 1990s    Current Medications: Current Meds  Medication Sig  . acetaminophen (TYLENOL) 500 MG tablet Take 1,000 mg by mouth every 6 (six) hours as  needed for headache (pain).   [name] ALPRAZolam (XANAX) 0.25 MG tablet Take 0.25 mg by mouth daily as needed for anxiety.   [name] aspirin 81 MG tablet Take 81 mg by mouth daily.  [name] BRILINTA 90 MG TABS tablet TAKE ONE TABLET BY MOUTH TWICE DAILY  . Brimonidine Tartrate-Timolol (COMBIGAN OP) Place 1 drop into both eyes 2 (two) times daily.  . isosorbide mononitrate (IMDUR) 60 MG 24 hr tablet Take 1 tablet (60 mg total) by mouth at bedtime.  [name] lisinopril (PRINIVIL,ZESTRIL) 10 MG tablet Take 1 tablet (10 mg total) by mouth daily.  . metFORMIN (GLUCOPHAGE) 500 MG tablet Take 1,000 mg by mouth 2 (two) times daily with a meal.   . nitroGLYCERIN (NITROSTAT) 0.4 MG SL tablet Place 1 tablet (0.4 mg total) under the tongue every 5 (five) minutes as needed for chest pain (MAX 3 doses in 15 minutes).  Vladimir Faster Glycol-Propyl Glycol (SYSTANE OP) Place 1 drop into both eyes daily as needed (dry eyes).  . rosuvastatin (CRESTOR) 20 MG tablet Take 1 tablet (20 mg total) by mouth daily at 6 PM.  . sertraline (ZOLOFT) 100 MG tablet Take 150 mg by mouth daily.      Allergies:   Niacin and Lipitor [atorvastatin]   Social History   Social History  . Marital status: Married    Spouse name: N/A  . Number of children: N/A  . Years of education: N/A   Social History Main Topics  . Smoking status: Former Smoker    Packs/day: 0.50    Years: 15.00    Types: Cigarettes    Quit date: 05/11/2004  . Smokeless tobacco: Never Used  . Alcohol use No  . Drug use: No  . Sexual activity: Not Currently   Other Topics Concern  . None   Social History Narrative   Lives in [place] with her husband.     Family History:  The patient's family history includes Cancer in her sister; Coronary artery disease (age of onset: 46) in her sister; Coronary artery disease (age of onset: 75) in her mother; Deep vein thrombosis in her cousin; Heart attack (age of onset: 35) in her father.   ROS:   Please see the history of present  illness.    ROS All other systems reviewed and are negative.   EKGs/Labs/Other  Test Reviewed:    EKG:  EKG is  ordered today.  The ekg ordered today demonstrates sinus brady, HR 53, inc RBBB, QTc 412 ms, no changes since 11/20/15  Recent Labs: 01/27/2016: ALT 13 07/22/2016: BUN 18; Creatinine, Ser 1.01; Potassium 4.7; Sodium 137; TSH 1.850 08/12/2016: Hemoglobin 11.9; Platelets 346   Recent Lipid Panel    Component Value Date/Time   CHOL 86 (L) 01/27/2016 1034   TRIG 134 01/27/2016 1034   HDL 29 (L) 01/27/2016 1034   CHOLHDL 3.0 01/27/2016 1034   CHOLHDL 3.6 09/06/2015 0957   VLDL 20 09/06/2015 0957   LDLCALC 30 01/27/2016 1034     Physical Exam:    VS:  BP (!) 142/60   Pulse (!) 56   Ht 5' 7.5" (1.715 m)   Wt 170 lb 6.4 oz (77.3 kg)   BMI 26.29 kg/m     Wt Readings from Last 3 Encounters:  09/21/16 170 lb 6.4 oz (77.3 kg)  08/12/16 169 lb (76.7 kg)  07/22/16 166 lb (75.3 kg)     Physical Exam  Constitutional: She is oriented to person, place, and time. She appears well-developed and well-nourished. No distress.  HENT:  Head: Normocephalic and atraumatic.  Eyes: No scleral icterus.  Neck: Normal range of motion. No JVD present. Carotid bruit is present.  Cardiovascular: Normal rate, regular rhythm, S1 normal, S2 normal and normal heart sounds.   No murmur heard. Pulmonary/Chest: Breath sounds normal. She has no wheezes. She has no rhonchi. She has no rales.  Abdominal: Soft. There is no tenderness.  Musculoskeletal: She exhibits edema.  R wrist without hematoma; trace LLE edema  Neurological: She is alert and oriented to person, place, and time.  Skin: Skin is warm and dry.  Psychiatric: She has a normal mood and affect.    ASSESSMENT:    No diagnosis found. PLAN:    In order of problems listed above:  1. CAD - s/p CABG with known occlusion of SVGs.  She is s/p PCI with DES to the RCA.   She's not had any episodes of angina. She does have diffuse small  vessel disease in addition to her coronary artery bypass grafting. Her year of aspirin plus Brilinta will be up in November. We will start her on Plavix.  2. HL - her lipids were recently checked at her medical doctor's office. Total cholesterol = 4 Triglyceride level=114 HL =34 LDL = 37. Continue current medications.  3. HTN -  BP is well controlled   4. Carotid artery disease -  Has moderate L carotid disease .    Needs recheck Carotid duplex scan   5. PAD - Med management.  She is followed by Dr. Kathlyn Sacramento.  .    Medication Adjustments/Labs and Tests Ordered: Current medicines are reviewed at length with the patient today.  Concerns regarding medicines are outlined above.  Medication changes, Labs and Tests ordered today are outlined in the Patient Instructions noted below. There are no Patient Instructions on file for this visit.      Mertie Moores, MD  09/21/2016 10:00 AM    [address], [place], [place]  [address] Phone: [phone]; Fax: [phone]

## 2016-09-21 NOTE — Progress Notes (Signed)
Cardiology Office Note:    Date:  09/21/2016   ID:  Taylor [name], DOB [birth year], MRN [id number]  PCP:  Lawerance Cruel, MD  Cardiologist:  Mertie Moores, MD    Referring MD: Lawerance Cruel, MD   Problem list 1. Coronary artery disease-status post coronary artery bypass grafting 2006 2. Peripheral vascular disease 3. History pulmonary embolus 4. History of breast cancer 5. Essential hypertension 6. Hyperlipidemia 7. Carotid artery disease 8. Because mellitus   Chief Complaint  Patient presents with  . Follow-up    CAD    History of Present Illness:    Taylor [name] is a 71 y.o. female with a hx of Coronary artery disease, hypertension, hyperlipidemia and peripheral vascular disease. She denies having any episodes of chest pain or shortness of breath. She complains of having lots of leg aches and pains. Symptoms do not sound like claudication. She has known occlusion of her popliteal arteries.    Past Medical History:  Diagnosis Date  . Cancer of left breast ([place])   . Complication of anesthesia   . Coronary artery disease    a. 2006 s/p CABG x 4 (LIMA->LAD, VG->Diag->OM, VG->RPDA);  b. 02/2015: [place] s/p DES to [facility], VG->PDA & VG->D1->OM 100; c. 06/2015 Cath: diffuse dzs->Med rx; d. 11/2015 Cath/PCI: LM nl, LAD 100ost, 95d, D1 90ost, LCX 35ost, 65p, OM2 40, lat OM2 90, RCA 95p (2.75x16 Synergy DES), patent mid stent, RPLB 70, LIMA->LAD ok.EF55-65%.  . Depression   . Hx of tobacco use, presenting hazards to health    a. quit 2006.  [name] Hypercholesteremia   . Hypertension   . Hypertensive heart disease   . Left carotid bruit    a. 09/2014 Carotid U/S: 1-39% bilat ICA stenosis.  [name] PAD (peripheral artery disease) ([place])    a. 11/2014 ABI: R: 0.63, L 0.59;  b. 11/2014 Periph Angio: bilat pop occlusions. L - short w/ reconstituion via collats in dist pop w/ 2 vessel runoff, R long w/ reconstitution in prox tib/peroneal arteries-->med Rx w/ pletal.  . Pulmonary embolism  ([place]) a. 2014.  [name] Reflux esophagitis   . Type II diabetes mellitus ([place])     Past Surgical History:  Procedure Laterality Date  . BREAST BIOPSY Left ~ 2012  . BUNIONECTOMY Left 1970s  . CARDIAC CATHETERIZATION  04/2004  . CARDIAC CATHETERIZATION N/A 02/21/2015   Procedure: Left Heart Cath and Cors/Grafts Angiography;  Surgeon: Belva Crome, MD;  Location: [facility];  Service: Cardiovascular;  Laterality: N/A;  . CARDIAC CATHETERIZATION N/A 02/21/2015   Procedure: Coronary Stent Intervention;  Surgeon: Belva Crome, MD;  Location: [facility];  Service: Cardiovascular;  Laterality: N/A;  . CARDIAC CATHETERIZATION N/A 02/21/2015   Procedure: Intravascular Pressure Wire/FFR Study;  Surgeon: Belva Crome, MD;  Location: [facility];  Service: Cardiovascular;  Laterality: N/A;  . CARDIAC CATHETERIZATION N/A 06/20/2015   Procedure: Left Heart Cath and Cors/Grafts Angiography;  Surgeon: Larey Dresser, MD;  Location: [facility];  Service: Cardiovascular;  Laterality: N/A;  . CARDIAC CATHETERIZATION N/A 11/19/2015   Procedure: Left Heart Cath and Cors/Grafts Angiography;  Surgeon: Peter M Martinique, MD;  Location: [facility];  Service: Cardiovascular;  Laterality: N/A;  . CARDIAC CATHETERIZATION N/A 11/19/2015   Procedure: Coronary Stent Intervention;  Surgeon: Peter M Martinique, MD;  Location: [facility];  Service: Cardiovascular;  Laterality: N/A;  . CATARACT EXTRACTION W/ INTRAOCULAR LENS  IMPLANT, BILATERAL  Bilateral 1980s-1990s  . CORONARY ANGIOPLASTY WITH STENT PLACEMENT  02/21/2015   "1 stent"  . CORONARY ARTERY BYPASS GRAFT  05/2004   "CABG X4"  . EYE SURGERY    . LAPAROSCOPIC CHOLECYSTECTOMY  1990s  . MASTECTOMY Left ~ 2012  . PERIPHERAL VASCULAR CATHETERIZATION N/A 12/12/2014   Procedure: Abdominal Aortogram;  Surgeon: Wellington Hampshire, MD;  Location: [facility];  Service: Cardiovascular;  Laterality: N/A;  . RETINAL DETACHMENT SURGERY Right  1990s    Current Medications: Current Meds  Medication Sig  . acetaminophen (TYLENOL) 500 MG tablet Take 1,000 mg by mouth every 6 (six) hours as needed for headache (pain).   [name] ALPRAZolam (XANAX) 0.25 MG tablet Take 0.25 mg by mouth daily as needed for anxiety.   [name] aspirin 81 MG tablet Take 81 mg by mouth daily.  . Brimonidine Tartrate-Timolol (COMBIGAN OP) Place 1 drop into both eyes 2 (two) times daily.  . isosorbide mononitrate (IMDUR) 60 MG 24 hr tablet Take 1 tablet (60 mg total) by mouth at bedtime.  [name] lisinopril (PRINIVIL,ZESTRIL) 10 MG tablet Take 1 tablet (10 mg total) by mouth daily.  . metFORMIN (GLUCOPHAGE) 500 MG tablet Take 1,000 mg by mouth 2 (two) times daily with a meal.   . nitroGLYCERIN (NITROSTAT) 0.4 MG SL tablet Place 1 tablet (0.4 mg total) under the tongue every 5 (five) minutes as needed for chest pain (MAX 3 doses in 15 minutes).  Vladimir Faster Glycol-Propyl Glycol (SYSTANE OP) Place 1 drop into both eyes daily as needed (dry eyes).  . rosuvastatin (CRESTOR) 20 MG tablet Take 1 tablet (20 mg total) by mouth daily at 6 PM.  . sertraline (ZOLOFT) 100 MG tablet Take 150 mg by mouth daily.   . [DISCONTINUED] BRILINTA 90 MG TABS tablet TAKE ONE TABLET BY MOUTH TWICE DAILY     Allergies:   Niacin and Lipitor [atorvastatin]   Social History   Social History  . Marital status: Married    Spouse name: N/A  . Number of children: N/A  . Years of education: N/A   Social History Main Topics  . Smoking status: Former Smoker    Packs/day: 0.50    Years: 15.00    Types: Cigarettes    Quit date: 05/11/2004  . Smokeless tobacco: Never Used  . Alcohol use No  . Drug use: No  . Sexual activity: Not Currently   Other Topics Concern  . None   Social History Narrative   Lives in [place] with her husband.     Family History: The patient's family history includes Cancer in her sister; Coronary artery disease (age of onset: 35) in her sister; Coronary artery disease  (age of onset: 32) in her mother; Deep vein thrombosis in her cousin; Heart attack (age of onset: 82) in her father. ROS:   Please see the history of present illness.     All other systems reviewed and are negative.  EKGs/Labs/Other Studies Reviewed:    The following studies were reviewed today:   EKG:  EKG is not  ordered today.     Recent Labs: 01/27/2016: ALT 13 07/22/2016: BUN 18; Creatinine, Ser 1.01; Potassium 4.7; Sodium 137; TSH 1.850 08/12/2016: Hemoglobin 11.9; Platelets 346  Recent Lipid Panel    Component Value Date/Time   CHOL 86 (L) 01/27/2016 1034   TRIG 134 01/27/2016 1034   HDL 29 (L) 01/27/2016 1034   CHOLHDL 3.0 01/27/2016 1034   CHOLHDL 3.6 09/06/2015 0957   VLDL 20  09/06/2015 0957   LDLCALC 30 01/27/2016 1034    Physical Exam:    VS:  BP (!) 142/60   Pulse (!) 56   Ht 5' 7.5" (1.715 m)   Wt 170 lb 6.4 oz (77.3 kg)   BMI 26.29 kg/m     Wt Readings from Last 3 Encounters:  09/21/16 170 lb 6.4 oz (77.3 kg)  08/12/16 169 lb (76.7 kg)  07/22/16 166 lb (75.3 kg)     GEN:  Well nourished, well developed in no acute distress HEENT: Normal NECK: No JVD; No carotid bruits LYMPHATICS: No lymphadenopathy CARDIAC: RR, no murmurs, rubs, gallops RESPIRATORY:  Clear to auscultation without rales, wheezing or rhonchi  ABDOMEN: Soft, non-tender, non-distended MUSCULOSKELETAL:  No edema; No deformity , diminshed distal foot pulses. SKIN: Warm and dry NEUROLOGIC:  Alert and oriented x 3 PSYCHIATRIC:  Normal affect   ASSESSMENT:    1. Mixed hyperlipidemia   2. PVD (peripheral vascular disease) ([place])   3. Essential hypertension   4. Coronary artery disease involving native coronary artery of native heart without angina pectoris   5. Carotid bruit, unspecified laterality    PLAN:    In order of problems listed above:  1. 1. Coronary artery disease: She appears to be fairly stable.  She has been on Brilinta for one year. We'll start her on Plavix.   2.  HL - her lipids were recently checked at her medical doctor's office. Total cholesterol = 4 Triglyceride level=114 HL =34 LDL = 37. Continue current medications.   3. HTN -  BP is well controlled   4. Carotid artery disease -  Has moderate L carotid disease .    Needs recheck Carotid duplex scan   5. PAD - Med management.  She is followed by Dr. Kathlyn Sacramento.  .      Medication Adjustments/Labs and Tests Ordered: Current medicines are reviewed at length with the patient today.  Concerns regarding medicines are outlined above.  Orders Placed This Encounter  Procedures  . EKG 12-Lead   Meds ordered this encounter  Medications  . clopidogrel (PLAVIX) 75 MG tablet    Sig: Take 1 tablet (75 mg total) by mouth daily.    Dispense:  90 tablet    Refill:  3    Signed, Mertie Moores, MD  09/21/2016 5:38 PM    [place]

## 2016-09-22 NOTE — Telephone Encounter (Signed)
Urgent request received from [facility] stating that the pt has been pre-qualified for Brilinta and that they need a RX for [place] [facility].  RX for Brilinta signed by Dr Acie Fredrickson has been faxed to [facility].

## 2016-09-23 DIAGNOSIS — M1712 Unilateral primary osteoarthritis, left knee: Secondary | ICD-10-CM | POA: Diagnosis not present

## 2016-09-23 DIAGNOSIS — M1711 Unilateral primary osteoarthritis, right knee: Secondary | ICD-10-CM | POA: Diagnosis not present

## 2016-09-28 ENCOUNTER — Ambulatory Visit (HOSPITAL_COMMUNITY)
Admission: RE | Admit: 2016-09-28 | Discharge: 2016-09-28 | Disposition: A | Discharge status: Home / Self Care | Payer: Medicare Other | Source: Ambulatory Visit | Attending: Cardiovascular Disease | Admitting: Cardiovascular Disease

## 2016-09-28 DIAGNOSIS — I2510 Atherosclerotic heart disease of native coronary artery without angina pectoris: Secondary | ICD-10-CM | POA: Diagnosis not present

## 2016-09-28 DIAGNOSIS — R0989 Other specified symptoms and signs involving the circulatory and respiratory systems: Secondary | ICD-10-CM | POA: Insufficient documentation

## 2016-10-02 ENCOUNTER — Telehealth: Payer: Self-pay | Admitting: *Deleted

## 2016-10-02 NOTE — Telephone Encounter (Signed)
-----   Message from Thayer Headings, MD sent at 09/29/2016  4:42 PM EDT ----- Minimal carotid disease  Will repeat as needed

## 2016-10-14 NOTE — Telephone Encounter (Signed)
Patient informed. 

## 2016-10-30 DIAGNOSIS — Z23 Encounter for immunization: Secondary | ICD-10-CM | POA: Diagnosis not present

## 2016-11-10 ENCOUNTER — Emergency Department (HOSPITAL_COMMUNITY): Payer: Medicare Other

## 2016-11-10 ENCOUNTER — Encounter (HOSPITAL_COMMUNITY): Payer: Self-pay

## 2016-11-10 ENCOUNTER — Emergency Department (HOSPITAL_COMMUNITY)
Admission: EM | Admit: 2016-11-10 | Discharge: 2016-11-10 | Disposition: A | Discharge status: Home / Self Care | Payer: Medicare Other | Attending: Emergency Medicine | Admitting: Emergency Medicine

## 2016-11-10 DIAGNOSIS — Y939 Activity, unspecified: Secondary | ICD-10-CM | POA: Diagnosis not present

## 2016-11-10 DIAGNOSIS — Z853 Personal history of malignant neoplasm of breast: Secondary | ICD-10-CM | POA: Diagnosis not present

## 2016-11-10 DIAGNOSIS — Y92018 Other place in single-family (private) house as the place of occurrence of the external cause: Secondary | ICD-10-CM | POA: Insufficient documentation

## 2016-11-10 DIAGNOSIS — E1051 Type 1 diabetes mellitus with diabetic peripheral angiopathy without gangrene: Secondary | ICD-10-CM | POA: Diagnosis not present

## 2016-11-10 DIAGNOSIS — S8002XA Contusion of left knee, initial encounter: Secondary | ICD-10-CM | POA: Diagnosis not present

## 2016-11-10 DIAGNOSIS — Z7902 Long term (current) use of antithrombotics/antiplatelets: Secondary | ICD-10-CM | POA: Insufficient documentation

## 2016-11-10 DIAGNOSIS — I2510 Atherosclerotic heart disease of native coronary artery without angina pectoris: Secondary | ICD-10-CM | POA: Insufficient documentation

## 2016-11-10 DIAGNOSIS — W19XXXA Unspecified fall, initial encounter: Secondary | ICD-10-CM | POA: Diagnosis not present

## 2016-11-10 DIAGNOSIS — Y92017 Garden or yard in single-family (private) house as the place of occurrence of the external cause: Secondary | ICD-10-CM | POA: Diagnosis not present

## 2016-11-10 DIAGNOSIS — S8012XA Contusion of left lower leg, initial encounter: Secondary | ICD-10-CM | POA: Diagnosis not present

## 2016-11-10 DIAGNOSIS — S0990XA Unspecified injury of head, initial encounter: Secondary | ICD-10-CM | POA: Diagnosis not present

## 2016-11-10 DIAGNOSIS — Z87891 Personal history of nicotine dependence: Secondary | ICD-10-CM | POA: Insufficient documentation

## 2016-11-10 DIAGNOSIS — M25562 Pain in left knee: Secondary | ICD-10-CM | POA: Diagnosis not present

## 2016-11-10 DIAGNOSIS — S8992XA Unspecified injury of left lower leg, initial encounter: Secondary | ICD-10-CM | POA: Diagnosis not present

## 2016-11-10 DIAGNOSIS — Y998 Other external cause status: Secondary | ICD-10-CM | POA: Diagnosis not present

## 2016-11-10 DIAGNOSIS — R9431 Abnormal electrocardiogram [ECG] [EKG]: Secondary | ICD-10-CM | POA: Diagnosis not present

## 2016-11-10 DIAGNOSIS — Z955 Presence of coronary angioplasty implant and graft: Secondary | ICD-10-CM | POA: Diagnosis not present

## 2016-11-10 DIAGNOSIS — Z951 Presence of aortocoronary bypass graft: Secondary | ICD-10-CM | POA: Insufficient documentation

## 2016-11-10 DIAGNOSIS — I5032 Chronic diastolic (congestive) heart failure: Secondary | ICD-10-CM | POA: Diagnosis not present

## 2016-11-10 DIAGNOSIS — R55 Syncope and collapse: Secondary | ICD-10-CM | POA: Insufficient documentation

## 2016-11-10 DIAGNOSIS — R110 Nausea: Secondary | ICD-10-CM | POA: Diagnosis not present

## 2016-11-10 DIAGNOSIS — Z79899 Other long term (current) drug therapy: Secondary | ICD-10-CM | POA: Diagnosis not present

## 2016-11-10 DIAGNOSIS — I110 Hypertensive heart disease with heart failure: Secondary | ICD-10-CM | POA: Diagnosis not present

## 2016-11-10 DIAGNOSIS — Z7982 Long term (current) use of aspirin: Secondary | ICD-10-CM | POA: Diagnosis not present

## 2016-11-10 DIAGNOSIS — Z7984 Long term (current) use of oral hypoglycemic drugs: Secondary | ICD-10-CM | POA: Diagnosis not present

## 2016-11-10 LAB — BASIC METABOLIC PANEL
ANION GAP: 9 (ref 5–15)
BUN: 20 mg/dL (ref 6–20)
CHLORIDE: 105 mmol/L (ref 101–111)
CO2: 23 mmol/L (ref 22–32)
Calcium: 8.9 mg/dL (ref 8.9–10.3)
Creatinine, Ser: 1.21 mg/dL — ABNORMAL HIGH (ref 0.44–1.00)
GFR calc non Af Amer: 45 mL/min — ABNORMAL LOW (ref >60)
GFR, EST AFRICAN AMERICAN: 52 mL/min — AB (ref >60)
Glucose, Bld: 157 mg/dL — ABNORMAL HIGH (ref 65–99)
POTASSIUM: 4.1 mmol/L (ref 3.5–5.1)
Sodium: 137 mmol/L (ref 135–145)

## 2016-11-10 LAB — URINALYSIS, ROUTINE W REFLEX MICROSCOPIC
Bilirubin Urine: NEGATIVE
GLUCOSE, UA: NEGATIVE mg/dL
KETONES UR: NEGATIVE mg/dL
Leukocytes, UA: NEGATIVE
Nitrite: NEGATIVE
PROTEIN: 30 mg/dL — AB
Specific Gravity, Urine: 1.009 (ref 1.005–1.030)
WBC UA: NONE SEEN WBC/hpf (ref 0–5)
pH: 5 (ref 5.0–8.0)

## 2016-11-10 LAB — CBC
HEMATOCRIT: 36.5 % (ref 36.0–46.0)
HEMOGLOBIN: 12 g/dL (ref 12.0–15.0)
MCH: 30.2 pg (ref 26.0–34.0)
MCHC: 32.9 g/dL (ref 30.0–36.0)
MCV: 91.9 fL (ref 78.0–100.0)
Platelets: 253 10*3/uL (ref 150–400)
RBC: 3.97 MIL/uL (ref 3.87–5.11)
RDW: 13 % (ref 11.5–15.5)
WBC: 17.4 10*3/uL — ABNORMAL HIGH (ref 4.0–10.5)

## 2016-11-10 LAB — I-STAT TROPONIN, ED: Troponin i, poc: 0.08 ng/mL (ref 0.00–0.08)

## 2016-11-10 LAB — CBG MONITORING, ED: Glucose-Capillary: 156 mg/dL — ABNORMAL HIGH (ref 65–99)

## 2016-11-10 MED ORDER — SODIUM CHLORIDE 0.9 % IV BOLUS (SEPSIS)
1000.0000 mL | Freq: Once | INTRAVENOUS | Status: AC
  Administered 2016-11-10: 1000 mL via INTRAVENOUS

## 2016-11-10 MED ORDER — MORPHINE SULFATE (PF) 4 MG/ML IV SOLN
4.0000 mg | Freq: Once | INTRAVENOUS | Status: AC
  Administered 2016-11-10: 4 mg via INTRAVENOUS
  Filled 2016-11-10: qty 1

## 2016-11-10 MED ORDER — HYDROCODONE-ACETAMINOPHEN 5-325 MG PO TABS: 1.0000 | ORAL_TABLET | Freq: Four times a day (QID) | ORAL | 0 refills | Status: DC | PRN

## 2016-11-10 MED ORDER — IOPAMIDOL (ISOVUE-370) INJECTION 76%
INTRAVENOUS | Status: AC
  Administered 2016-11-10: 100 mL
  Filled 2016-11-10: qty 100

## 2016-11-10 NOTE — Progress Notes (Signed)
Orthopedic Tech Progress Note Patient Details:  Taylor [name] Feb 21, 1945 [id number]  Ortho Devices Type of Ortho Device: Knee Immobilizer Ortho Device/Splint Interventions: Application   [name] 11/10/2016, 3:58 PM

## 2016-11-10 NOTE — Discharge Instructions (Signed)
Stay hydrated.   Use knee immobilizer for comfort. Use your walker for several days.   See Dr. Acie Fredrickson and your orthopedic doctor next week.   Return to ER if you have worse shortness of breath, chest pain, passing out.

## 2016-11-10 NOTE — ED Notes (Signed)
Returned to CT Scan

## 2016-11-10 NOTE — ED Notes (Signed)
CBG 156

## 2016-11-10 NOTE — ED Notes (Signed)
Pt brought to [id number] from CT Scan where pt was in too much pain (knee) and too weak to sit in chair. Pt is alert and oriented. States she fell multiple times yesterday and once today. Unknown if these falls are due to syncope or left knee pain. With small hematoma to left eyebrow from fall yesterday. C/o dry heaving this am. No cp. No sob. Skin w/d, resp e/u

## 2016-11-10 NOTE — ED Triage Notes (Signed)
Patient reports that she had possible syncopal event yesterday with fall and reports second fall today. Arrived with repetitive statements but alert and oriented, complains of left knee pain, swelling noted and hematoma above left eye. No bleeding

## 2016-11-10 NOTE — ED Provider Notes (Signed)
[facility]ERGENCY DEPARTMENT Provider Note   CSN: [id number] Arrival date & time: 11/10/16  1058     History   Chief Complaint Chief Complaint  Patient presents with  . Fall/possible syncope    HPI Taylor [name] is a 71 y.o. female history of CAD status post CABG, hypertension, PAD here presenting with syncope.  Patient states that she was in the yard yesterday and had a episode of syncope.  Patient denies any preceding dizziness or lightheadedness or chest pain.  She fell and hit her knee yesterday but did not hit her head.  Today she was in the shower and she had another episode of syncope.  Finally, when she was in the hallway she had another episode of syncope and fell and hit her head.  Patient denies being on blood thinners but is on Plavix.  Denies any shortness of breath but just feels nauseated.  Patient does have a history of CAD with stents and CABG. She also has a history of pulmonary embolus but is not currently on blood thinners.  The history is provided by the patient.    Past Medical History:  Diagnosis Date  . Cancer of left breast ([place])   . Complication of anesthesia   . Coronary artery disease    a. 2006 s/p CABG x 4 (LIMA->LAD, VG->Diag->OM, VG->RPDA);  b. 02/2015: [place] s/p DES to [facility], VG->PDA & VG->D1->OM 100; c. 06/2015 Cath: diffuse dzs->Med rx; d. 11/2015 Cath/PCI: LM nl, LAD 100ost, 95d, D1 90ost, LCX 35ost, 65p, OM2 40, lat OM2 90, RCA 95p (2.75x16 Synergy DES), patent mid stent, RPLB 70, LIMA->LAD ok.EF55-65%.  . Depression   . Hx of tobacco use, presenting hazards to health    a. quit 2006.  [name] Hypercholesteremia   . Hypertension   . Hypertensive heart disease   . Left carotid bruit    a. 09/2014 Carotid U/S: 1-39% bilat ICA stenosis.  [name] PAD (peripheral artery disease) ([place])    a. 11/2014 ABI: R: 0.63, L 0.59;  b. 11/2014 Periph Angio: bilat pop occlusions. L - short w/ reconstituion via collats in dist pop w/ 2 vessel runoff, R  long w/ reconstitution in prox tib/peroneal arteries-->med Rx w/ pletal.  . Pulmonary embolism ([place]) a. 2014.  [name] Reflux esophagitis   . Type II diabetes mellitus [facility])     Patient Active Problem List   Diagnosis Date Noted  . Mixed hyperlipidemia 09/21/2016  . Carotid artery disease ([place]) 12/03/2015  . OSA (obstructive sleep apnea) 12/03/2015  . Pain and swelling of left lower leg 11/17/2015  . Coronary artery disease   . Hypertensive heart disease   . Type II diabetes mellitus ([place])   . PAD (peripheral artery disease) ([place]) 11/03/2014  . Chest pain 08/06/2014  . Nausea with vomiting 05/08/2014  . Allergic reaction 05/08/2014  . Hypercholesteremia   . Hypertension   . Diabetes mellitus ([place])   . Hx of tobacco use, presenting hazards to health 12/21/2013  . Varicose veins of left lower extremity 12/18/2013  . Cardiac enzymes elevated 03/01/2012  . Chronic diastolic heart failure, NYHA class 1 ([place]) 03/01/2012  . Bilateral pulmonary embolism ([place]) 02/29/2012  . 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 03/06/2011  . Chest pain 11/30/2010  . Left carotid bruit 02/26/2009  . CHRONIC LEFT LEG VENOUS INSUFFICIENCY 07/02/2008  . EDEMA 05/14/2008  . HYPERCHOLESTEROLEMIA 05/10/2008  . CAD, ARTERY BYPASS GRAFT 05/10/2008  . ESOPHAGITIS, REFLUX 05/10/2008  . DM (diabetes mellitus)  type I uncontrolled, periph vascular disorder ([place]) 10/18/2006  . DEPRESSION 10/18/2006  . Essential hypertension 10/18/2006    Past Surgical History:  Procedure Laterality Date  . BREAST BIOPSY Left ~ 2012  . BUNIONECTOMY Left 1970s  . CARDIAC CATHETERIZATION  04/2004  . CARDIAC CATHETERIZATION N/A 02/21/2015   Procedure: Left Heart Cath and Cors/Grafts Angiography;  Surgeon: Belva Crome, MD;  Location: [facility];  Service: Cardiovascular;  Laterality: N/A;  . CARDIAC CATHETERIZATION N/A 02/21/2015   Procedure: Coronary Stent Intervention;  Surgeon: Belva Crome, MD;  Location:  [facility];  Service: Cardiovascular;  Laterality: N/A;  . CARDIAC CATHETERIZATION N/A 02/21/2015   Procedure: Intravascular Pressure Wire/FFR Study;  Surgeon: Belva Crome, MD;  Location: [facility];  Service: Cardiovascular;  Laterality: N/A;  . CARDIAC CATHETERIZATION N/A 06/20/2015   Procedure: Left Heart Cath and Cors/Grafts Angiography;  Surgeon: Larey Dresser, MD;  Location: [facility];  Service: Cardiovascular;  Laterality: N/A;  . CARDIAC CATHETERIZATION N/A 11/19/2015   Procedure: Left Heart Cath and Cors/Grafts Angiography;  Surgeon: Peter M Martinique, MD;  Location: [facility];  Service: Cardiovascular;  Laterality: N/A;  . CARDIAC CATHETERIZATION N/A 11/19/2015   Procedure: Coronary Stent Intervention;  Surgeon: Peter M Martinique, MD;  Location: [facility];  Service: Cardiovascular;  Laterality: N/A;  . CATARACT EXTRACTION W/ INTRAOCULAR LENS  IMPLANT, BILATERAL Bilateral 1980s-1990s  . CORONARY ANGIOPLASTY WITH STENT PLACEMENT  02/21/2015   "1 stent"  . CORONARY ARTERY BYPASS GRAFT  05/2004   "CABG X4"  . EYE SURGERY    . LAPAROSCOPIC CHOLECYSTECTOMY  1990s  . MASTECTOMY Left ~ 2012  . PERIPHERAL VASCULAR CATHETERIZATION N/A 12/12/2014   Procedure: Abdominal Aortogram;  Surgeon: Wellington Hampshire, MD;  Location: [facility];  Service: Cardiovascular;  Laterality: N/A;  . RETINAL DETACHMENT SURGERY Right 1990s    OB History    No data available       Home Medications    Prior to Admission medications   Medication Sig Start Date End Date Taking? Authorizing Provider  acetaminophen (TYLENOL) 500 MG tablet Take 1,000 mg by mouth every 6 (six) hours as needed for headache (pain).     [provider]  ALPRAZolam Duanne Moron) 0.25 MG tablet Take 0.25 mg by mouth daily as needed for anxiety.     [provider]  aspirin 81 MG tablet Take 81 mg by mouth daily.    [provider]  Brimonidine Tartrate-Timolol (COMBIGAN OP)  Place 1 drop into both eyes 2 (two) times daily.    [provider]  clopidogrel (PLAVIX) 75 MG tablet Take 1 tablet (75 mg total) by mouth daily. 09/21/16   Nahser, Wonda Cheng, MD  isosorbide mononitrate (IMDUR) 60 MG 24 hr tablet Take 1 tablet (60 mg total) by mouth at bedtime. 12/24/15   Richardson Dopp T, PA-C  lisinopril (PRINIVIL,ZESTRIL) 10 MG tablet Take 1 tablet (10 mg total) by mouth daily. 07/06/16   Nahser, Wonda Cheng, MD  metFORMIN (GLUCOPHAGE) 500 MG tablet Take 1,000 mg by mouth 2 (two) times daily with a meal.     [provider]  nitroGLYCERIN (NITROSTAT) 0.4 MG SL tablet Place 1 tablet (0.4 mg total) under the tongue every 5 (five) minutes as needed for chest pain (MAX 3 doses in 15 minutes). 11/20/15   Geradine Girt, DO  Polyethyl Glycol-Propyl Glycol (SYSTANE OP) Place 1 drop into both eyes daily  as needed (dry eyes).    [provider]  rosuvastatin (CRESTOR) 20 MG tablet Take 1 tablet (20 mg total) by mouth daily at 6 PM. 12/19/15   Nahser, Wonda Cheng, MD  sertraline (ZOLOFT) 100 MG tablet Take 150 mg by mouth daily.     [provider]    Family History Family History  Problem Relation Age of Onset  . Coronary artery disease Mother 20       status post CABG   . Heart attack Father 17       deceased secondary to fatal  first myocardial infarction  . Coronary artery disease Sister 20       alive had bypass surgery  . Cancer Sister   . Deep vein thrombosis Cousin        Neice had after knee surgery    Social History Social History  Substance Use Topics  . Smoking status: Former Smoker    Packs/day: 0.50    Years: 15.00    Types: Cigarettes    Quit date: 05/11/2004  . Smokeless tobacco: Never Used  . Alcohol use No     Allergies   Niacin and Lipitor [atorvastatin]   Review of Systems Review of Systems  Neurological: Positive for syncope.  All other systems reviewed and are negative.    Physical Exam Updated Vital Signs BP  (!) 126/53   Pulse (!) 49   Temp 98.7 F (37.1 C) (Oral)   Resp 13   SpO2 100%   Physical Exam  Constitutional: She appears well-developed.  HENT:  Head: Normocephalic.  Abrasion L eyebrow   Eyes: Pupils are equal, round, and reactive to light. Conjunctivae and EOM are normal.  Neck: Normal range of motion. Neck supple.  Cardiovascular: Normal rate, regular rhythm and normal heart sounds.   Pulmonary/Chest: Effort normal and breath sounds normal. No respiratory distress. She has no wheezes. She has no rales.  Abdominal: Soft. Bowel sounds are normal. She exhibits no distension. There is no tenderness. There is no guarding.  Musculoskeletal: Normal range of motion.  L knee slightly swollen and tender, dec ROM but no obvious deformity   Neurological: She is alert.  Skin: Skin is warm.  Psychiatric: She has a normal mood and affect.  Nursing note and vitals reviewed.    ED Treatments / Results  Labs (all labs ordered are listed, but only abnormal results are displayed) Labs Reviewed  BASIC METABOLIC PANEL - Abnormal; Notable for the following:       Result Value   Glucose, Bld 157 (*)    Creatinine, Ser 1.21 (*)    GFR calc non Af Amer 45 (*)    GFR calc Af Amer 52 (*)    All other components within normal limits  CBC - Abnormal; Notable for the following:    WBC 17.4 (*)    All other components within normal limits  URINALYSIS, ROUTINE W REFLEX MICROSCOPIC - Abnormal; Notable for the following:    Hgb urine dipstick SMALL (*)    Protein, ur 30 (*)    Bacteria, UA RARE (*)    Squamous Epithelial / LPF 0-5 (*)    All other components within normal limits  CBG MONITORING, ED - Abnormal; Notable for the following:    Glucose-Capillary 156 (*)    All other components within normal limits  I-STAT TROPONIN, ED    EKG  EKG Interpretation  Date/Time:  Tuesday November 10 2016 11:09:00 EDT Ventricular Rate:  62 PR  Interval:  144 QRS Duration: 106 QT Interval:  456 QTC  Calculation: 462 R Axis:   47 Text Interpretation:  Sinus rhythm with marked sinus arrhythmia with occasional Premature ventricular complexes Incomplete right bundle branch block Borderline ECG No significant change since last tracing Confirmed by Wandra Arthurs [phone]) on 11/10/2016 11:57:40 AM       Radiology Ct Head Wo Contrast  Result Date: 11/10/2016 CLINICAL DATA:  Syncope.  Fall yesterday and today. EXAM: CT HEAD WITHOUT CONTRAST TECHNIQUE: Contiguous axial images were obtained from the base of the skull through the vertex without intravenous contrast. COMPARISON:  05/08/2014 FINDINGS: Brain: Mild chronic small vessel disease throughout the deep white matter, stable. No acute intracranial abnormality. Specifically, no hemorrhage, hydrocephalus, mass lesion, acute infarction, or significant intracranial injury. Vascular: No hyperdense vessel. Extensive calcifications in the vessels of the skullbase. Skull: No acute calvarial abnormality. Sinuses/Orbits: Visualized paranasal sinuses and mastoids clear. Orbital soft tissues unremarkable. Bilateral scleral bands noted. Other: None IMPRESSION: Mild chronic small vessel disease throughout the deep white matter. No acute intracranial abnormality. Electronically Signed   By: Rolm Baptise M.D.   On: 11/10/2016 11:58   Ct Angio Chest Pe W And/or Wo Contrast  Result Date: 11/10/2016 CLINICAL DATA:  71 year old female with multiple syncopal episodes over the past week. EXAM: CT ANGIOGRAPHY CHEST WITH CONTRAST TECHNIQUE: Multidetector CT imaging of the chest was performed using the standard protocol during bolus administration of intravenous contrast. Multiplanar CT image reconstructions and MIPs were obtained to evaluate the vascular anatomy. CONTRAST:  56 mL Isovue 370 COMPARISON:  CTA chest 11/17/2015 FINDINGS: Cardiovascular: Good contrast bolus timing in the pulmonary arterial tree. No focal filling defect identified in the pulmonary arteries to suggest  acute pulmonary embolism. Prior CABG.  Stable cardiomegaly.  No pericardial effusion. Mediastinum/Nodes: Stable small mediastinal lymph nodes. Lungs/Pleura: Major airways are patent. Stable lung parenchyma with dependent ground-glass opacity and mosaic attenuation. No pleural effusion or more confluent pulmonary opacity. Upper Abdomen: Stable. Musculoskeletal: Prior sternotomy. Degenerative endplate changes in the thoracic spine. No acute osseous abnormality identified. Other findings: Left mastectomy.  Stable right breast parenchyma. Review of the MIP images confirms the above findings. IMPRESSION: 1.  Negative for acute pulmonary embolus. 2. Stable cardiomegaly and prior CABG. 3. Stable lung parenchyma appearance since 2017 with nonspecific lower lobe and dependent ground-glass opacity and mosaic attenuation. This may reflect a combination of atelectasis and gas trapping. No pleural effusion. Electronically Signed   By: Genevie Ann M.D.   On: 11/10/2016 15:33   Dg Knee Complete 4 Views Left  Result Date: 11/10/2016 CLINICAL DATA:  Fall.  Pain. EXAM: LEFT KNEE - COMPLETE 4+ VIEW COMPARISON:  None FINDINGS: No joint effusion. Extensive vascular calcifications noted. Sharpening the tibial spines, medial compartment narrowing and marginal spur formation identified compatible with osteoarthritis. No fractures or dislocations identified. IMPRESSION: 1. No acute findings. 2. Moderate osteoarthritis. Electronically Signed   By: Kerby Moors M.D.   On: 11/10/2016 11:48    Procedures Procedures (including critical care time)  Medications Ordered in ED Medications  sodium chloride 0.9 % bolus 1,000 mL (1,000 mLs Intravenous New Bag/Given 11/10/16 1242)  morphine 4 MG/ML injection 4 mg (4 mg Intravenous Given 11/10/16 1340)  iopamidol (ISOVUE-370) 76 % injection (100 mLs  Contrast Given 11/10/16 1453)     Initial Impression / Assessment and Plan / ED Course  I have reviewed the triage vital signs and the  nursing notes.  Pertinent labs & imaging results that  were available during my care of the patient were reviewed by me and considered in my medical decision making (see chart for details).    Taylor [name] is a 71 y.o. female here with recurrent syncope with no preceding dizziness or vertigo. Concerned for possible arrhythmia. Has CAD hx. EKG unchanged. Trop 0.08, no chest pain, I consulted cardiology to see patient.   3:48 PM WBC 17. UA nl. CT angio showed no PE. I initially wanted to admit patient for syncope workup. But Dr. Acie Fredrickson saw patient. He knows her well and saw her multiple times this year and thought likely orthostasis and doesn't want repeat trop or further cardiac workup and will have patient follow up with him next week. Xray of the knee showed no fracture, placed knee immobilizer. Patient has walker at home and ortho follow up.    Final Clinical Impressions(s) / ED Diagnoses   Final diagnoses:  None    New Prescriptions New Prescriptions   No medications on file     Drenda Freeze, MD 11/10/16 1549

## 2016-11-10 NOTE — Consult Note (Signed)
Cardiology Consult    Patient ID: Taylor [name] MRN: [id number], DOB/AGE: Feb 24, 1945   Admit date: 11/10/2016 Date of Consult: 11/10/2016  Primary Physician: Lawerance Cruel, MD Primary Cardiologist: Chanae Gemma Requesting Provider: Darl Householder Reason for Consultation: Syncope, + troponin  Taylor [name] is a 71 y.o. female who is being seen today for the evaluation of syncope/+ troponin at the request of Dr. Darl Householder.   Patient Profile    71 yo female with PMH of CAD s/p CABG ('06), PAD, PE ('14), breast CA who presented with syncope.   Past Medical History   Past Medical History:  Diagnosis Date  . Cancer of left breast ([place])   . Complication of anesthesia   . Coronary artery disease    a. 2006 s/p CABG x 4 (LIMA->LAD, VG->Diag->OM, VG->RPDA);  b. 02/2015: [place] s/p DES to [facility], VG->PDA & VG->D1->OM 100; c. 06/2015 Cath: diffuse dzs->Med rx; d. 11/2015 Cath/PCI: LM nl, LAD 100ost, 95d, D1 90ost, LCX 35ost, 65p, OM2 40, lat OM2 90, RCA 95p (2.75x16 Synergy DES), patent mid stent, RPLB 70, LIMA->LAD ok.EF55-65%.  . Depression   . Hx of tobacco use, presenting hazards to health    a. quit 2006.  [name] Hypercholesteremia   . Hypertension   . Hypertensive heart disease   . Left carotid bruit    a. 09/2014 Carotid U/S: 1-39% bilat ICA stenosis.  [name] PAD (peripheral artery disease) ([place])    a. 11/2014 ABI: R: 0.63, L 0.59;  b. 11/2014 Periph Angio: bilat pop occlusions. L - short w/ reconstituion via collats in dist pop w/ 2 vessel runoff, R long w/ reconstitution in prox tib/peroneal arteries-->med Rx w/ pletal.  . Pulmonary embolism ([place]) a. 2014.  [name] Reflux esophagitis   . Type II diabetes mellitus ([place])     Past Surgical History:  Procedure Laterality Date  . BREAST BIOPSY Left ~ 2012  . BUNIONECTOMY Left 1970s  . CARDIAC CATHETERIZATION  04/2004  . CARDIAC CATHETERIZATION N/A 02/21/2015   Procedure: Left Heart Cath and Cors/Grafts Angiography;  Surgeon: Belva Crome, MD;  Location: [place]  CV LAB;  Service: Cardiovascular;  Laterality: N/A;  . CARDIAC CATHETERIZATION N/A 02/21/2015   Procedure: Coronary Stent Intervention;  Surgeon: Belva Crome, MD;  Location: [place] CV LAB;  Service: Cardiovascular;  Laterality: N/A;  . CARDIAC CATHETERIZATION N/A 02/21/2015   Procedure: Intravascular Pressure Wire/FFR Study;  Surgeon: Belva Crome, MD;  Location: [place] CV LAB;  Service: Cardiovascular;  Laterality: N/A;  . CARDIAC CATHETERIZATION N/A 06/20/2015   Procedure: Left Heart Cath and Cors/Grafts Angiography;  Surgeon: Larey Dresser, MD;  Location: [place] CV LAB;  Service: Cardiovascular;  Laterality: N/A;  . CARDIAC CATHETERIZATION N/A 11/19/2015   Procedure: Left Heart Cath and Cors/Grafts Angiography;  Surgeon: Peter M Martinique, MD;  Location: [place] CV LAB;  Service: Cardiovascular;  Laterality: N/A;  . CARDIAC CATHETERIZATION N/A 11/19/2015   Procedure: Coronary Stent Intervention;  Surgeon: Peter M Martinique, MD;  Location: [place] CV LAB;  Service: Cardiovascular;  Laterality: N/A;  . CATARACT EXTRACTION W/ INTRAOCULAR LENS  IMPLANT, BILATERAL Bilateral 1980s-1990s  . CORONARY ANGIOPLASTY WITH STENT PLACEMENT  02/21/2015   "1 stent"  . CORONARY ARTERY BYPASS GRAFT  05/2004   "CABG X4"  . EYE SURGERY    . LAPAROSCOPIC CHOLECYSTECTOMY  1990s  . MASTECTOMY Left ~ 2012  . PERIPHERAL VASCULAR CATHETERIZATION N/A 12/12/2014   Procedure: Abdominal Aortogram;  Surgeon: Wellington Hampshire, MD;  Location: [place] CV LAB;  Service: Cardiovascular;  Laterality: N/A;  . RETINAL DETACHMENT SURGERY Right 1990s     Allergies  Allergies  Allergen Reactions  . Niacin Other (See Comments)    burning  . Lipitor [Atorvastatin] Other (See Comments)    Muscle pain    History of Present Illness    Taylor [name] is a 71 yo female with PMH of CAD s/p CABG ('06), PAD, PE ('14), breast CA. Also has bilateral popliteal artery occlusions. Was admitted 2/17 underwent cath with RCA  and Diag/Om occluded, had DES to [facility]. pLCx was negative by FFR. Had repeat cath in 6/17, noting patent RCA stent with new 50% lesion proximal to RCA stent. Medical therapy was recommended. She was again 11/5-11/8 with unstable angina. CTA was negative for PE. Cardiac enzymes were negative, but underwent cath showing patent mRCA stent, stable disease in the LCx. PCI/DES to pRCA lesion. Post cath was not placed on BB therapy, and statin was increased.   She was last seen in the office on 9/18 and reported having some orthopedic issues with her knees. No chest pain, or dyspnea. Noted to have small vessel disease and was on Imdur for this. Was still on DAPT with ASA/Brilinta.   She presents now to the ED with a syncopal episode. States she has not felt "right" for the past couple of days. Yesterday was walking the dog and fell landing on her left knee. Lots of pain afterwards. This morning was in the shower and fell again. Then this afternoon she was at her son's house walking down the hallway with a crutch and had a syncopal episode. Did not States this was only for a couple of seconds. Denies any chest pain, dyspnea, palpitations, dizziness or light-headedness. Labs showed stable electrolytes, Hgb 12.0, Trop 0.08. EKG showed SR with no acute ST/T wave abnormalities. Review of telemetry shows SR with PACs/PVCs.   Inpatient Medications    . iopamidol        Family History    Family History  Problem Relation Age of Onset  . Coronary artery disease Mother 32       status post CABG   . Heart attack Father 81       deceased secondary to fatal  first myocardial infarction  . Coronary artery disease Sister 75       alive had bypass surgery  . Cancer Sister   . Deep vein thrombosis Cousin        Neice had after knee surgery    Social History    Social History   Social History  . Marital status: Married    Spouse name: N/A  . Number of children: N/A  . Years of education: N/A   Occupational  History  . Not on file.   Social History Main Topics  . Smoking status: Former Smoker    Packs/day: 0.50    Years: 15.00    Types: Cigarettes    Quit date: 05/11/2004  . Smokeless tobacco: Never Used  . Alcohol use No  . Drug use: No  . Sexual activity: Not Currently   Other Topics Concern  . Not on file   Social History Narrative   Lives in [place] with her husband.     Review of Systems    See HPI  All other systems reviewed and are otherwise negative except as noted above.  Physical Exam    Blood pressure (!) 139/46, pulse 61, temperature 98.7 F (37.1 C),  temperature source Oral, resp. rate 17, SpO2 99 %.  General: Pleasant, NAD Psych: Normal affect. Neuro: Alert and oriented X 3. Moves all extremities spontaneously. HEENT: Normal  Neck: Supple without bruits or JVD. Lungs:  Resp regular and unlabored, CTA. Heart: RRR no s3, s4, or murmurs. Abdomen: Soft, non-tender, non-distended, BS + x 4.  Extremities: No clubbing, cyanosis or edema. DP/PT/Radials 2+ and equal bilaterally.  Labs    Troponin [facility] of Care Test)  Recent Labs  11/10/16 1216  TROPIPOC 0.08   No results for input(s): CKTOTAL, CKMB, TROPONINI in the last 72 hours. Lab Results  Component Value Date   WBC 17.4 (H) 11/10/2016   HGB 12.0 11/10/2016   HCT 36.5 11/10/2016   MCV 91.9 11/10/2016   PLT 253 11/10/2016    Recent Labs Lab 11/10/16 1111  NA 137  K 4.1  CL 105  CO2 23  BUN 20  CREATININE 1.21*  CALCIUM 8.9  GLUCOSE 157*   Lab Results  Component Value Date   CHOL 86 (L) 01/27/2016   HDL 29 (L) 01/27/2016   LDLCALC 30 01/27/2016   TRIG 134 01/27/2016   Lab Results  Component Value Date   DDIMER 12.54 (H) 02/29/2012     Radiology Studies    Ct Head Wo Contrast  Result Date: 11/10/2016 CLINICAL DATA:  Syncope.  Fall yesterday and today. EXAM: CT HEAD WITHOUT CONTRAST TECHNIQUE: Contiguous axial images were obtained from the base of the skull through the  vertex without intravenous contrast. COMPARISON:  05/08/2014 FINDINGS: Brain: Mild chronic small vessel disease throughout the deep white matter, stable. No acute intracranial abnormality. Specifically, no hemorrhage, hydrocephalus, mass lesion, acute infarction, or significant intracranial injury. Vascular: No hyperdense vessel. Extensive calcifications in the vessels of the skullbase. Skull: No acute calvarial abnormality. Sinuses/Orbits: Visualized paranasal sinuses and mastoids clear. Orbital soft tissues unremarkable. Bilateral scleral bands noted. Other: None IMPRESSION: Mild chronic small vessel disease throughout the deep white matter. No acute intracranial abnormality. Electronically Signed   By: Rolm Baptise M.D.   On: 11/10/2016 11:58   Dg Knee Complete 4 Views Left  Result Date: 11/10/2016 CLINICAL DATA:  Fall.  Pain. EXAM: LEFT KNEE - COMPLETE 4+ VIEW COMPARISON:  None FINDINGS: No joint effusion. Extensive vascular calcifications noted. Sharpening the tibial spines, medial compartment narrowing and marginal spur formation identified compatible with osteoarthritis. No fractures or dislocations identified. IMPRESSION: 1. No acute findings. 2. Moderate osteoarthritis. Electronically Signed   By: Kerby Moors M.D.   On: 11/10/2016 11:48    ECG & Cardiac Imaging    EKG: SR with no acute ST/T wave changes.  Assessment & Plan    71 yo female with PMH of CAD s/p CABG ('06), PAD, PE ('14), breast CA who presented with syncope.   1. Syncope: Reports she fell twice over the past 2 days. Then had a possible syncopal episode this morning while at her son's house. States this only lasted a couple of seconds. No chest pain, dyspnea, lightheadedness or dizziness. CT head was negative. Reports she did not eat breakfast this morning. EKG non acute, telemetry shows SR with PACs/PVCs. Trop 0.08. This is not her anginal equivalent. Suspect this not cardiac in nature.   2. CAD s/p CABG with multiple  stents: No chest pain. Remains on DAPT.  3. HTN: stable   Signed, Reino Bellis, NP-C Pager [phone] 11/10/2016, 2:09 PM   Attending Note:   The patient was seen and examined.  Agree with assessment and  plan as noted above.  Changes made to the above note as needed.  Patient seen and independently examined with Reino Bellis, NP .   We discussed all aspects of the encounter. I agree with the assessment and plan as stated above.  1.  Syncopal episodes: The patient presented with 2 falls today.  Her episodes sound more like orthostatic hypotension accounted by a knee injury.  Her's symptoms do not sound like an arrhythmia.  She has no bradycardia on exam or EKG.  She had skipped breakfast this morning and skips breakfast quite frequently.  I have encouraged her to eat on a more regular basis.  She needs to stay hydrated.  I have encouraged her to also eat protein to help keep her fluids intravascular.  There is no evidence of acute coronary syndrome.  We will see her in the office in several weeks for follow-up visit.  If she continues to have these episodes of syncope/presyncope, will consider placing a 30-day event monitor.   I have spent a total of 40 minutes with patient reviewing hospital  notes , telemetry, EKGs, labs and examining patient as well as establishing an assessment and plan that was discussed with the patient. > 50% of time was spent in direct patient care.    Thayer Headings, Brooke Bonito., MD, [facility] 11/10/2016, 3:05 PM [address]  [place] Pager [phone]

## 2016-11-12 DIAGNOSIS — M1712 Unilateral primary osteoarthritis, left knee: Secondary | ICD-10-CM | POA: Diagnosis not present

## 2016-11-30 ENCOUNTER — Other Ambulatory Visit: Payer: Self-pay | Admitting: Cardiovascular Disease

## 2016-12-23 ENCOUNTER — Other Ambulatory Visit: Payer: Self-pay | Admitting: Physician Assistant

## 2016-12-23 NOTE — Telephone Encounter (Signed)
°*  STAT* If patient is at the pharmacy, call can be transferred to refill team.   1. Which medications need to be refilled? (please list name of each medication and dose if known) isosorbide mononitrate   2. Which pharmacy/location (including street and city if local pharmacy) is medication to be sent to? [facility] on [address]   3. Do they need a 30 day or 90 day supply? [place]

## 2016-12-24 DIAGNOSIS — E113551 Type 2 diabetes mellitus with stable proliferative diabetic retinopathy, right eye: Secondary | ICD-10-CM | POA: Diagnosis not present

## 2016-12-24 DIAGNOSIS — H10413 Chronic giant papillary conjunctivitis, bilateral: Secondary | ICD-10-CM | POA: Diagnosis not present

## 2016-12-24 DIAGNOSIS — H33053 Total retinal detachment, bilateral: Secondary | ICD-10-CM | POA: Diagnosis not present

## 2016-12-24 DIAGNOSIS — E119 Type 2 diabetes mellitus without complications: Secondary | ICD-10-CM | POA: Diagnosis not present

## 2016-12-24 DIAGNOSIS — H04123 Dry eye syndrome of bilateral lacrimal glands: Secondary | ICD-10-CM | POA: Diagnosis not present

## 2016-12-24 DIAGNOSIS — H31093 Other chorioretinal scars, bilateral: Secondary | ICD-10-CM | POA: Diagnosis not present

## 2016-12-24 DIAGNOSIS — D3132 Benign neoplasm of left choroid: Secondary | ICD-10-CM | POA: Diagnosis not present

## 2016-12-24 DIAGNOSIS — H40013 Open angle with borderline findings, low risk, bilateral: Secondary | ICD-10-CM | POA: Diagnosis not present

## 2016-12-24 DIAGNOSIS — Z961 Presence of intraocular lens: Secondary | ICD-10-CM | POA: Diagnosis not present

## 2016-12-25 ENCOUNTER — Telehealth: Payer: Self-pay | Admitting: Cardiovascular Disease

## 2016-12-25 NOTE — Telephone Encounter (Signed)
F/u Message  Pt call to f/u on surgical clearance.

## 2016-12-25 NOTE — Telephone Encounter (Signed)
Returned pts call.  She has been advised that we haven't received any kind of clearance from anyone on her behalf. She was going to put a call into her dentist office. Pt thanked me for the call.

## 2016-12-31 ENCOUNTER — Encounter: Payer: Self-pay | Admitting: Cardiology

## 2017-01-03 NOTE — Progress Notes (Signed)
Cardiology Office Note:    Date:  01/04/2017   ID:  ULYSSA WALTHOUR, DOB January 10, 1946, MRN [id number]  PCP:  Lawerance Cruel, MD  Cardiologist:  Fransico Him, MD   Referring MD: Lawerance Cruel, MD   Chief Complaint  Patient presents with  . Sleep Apnea  . Hypertension    History of Present Illness:    Taylor [name] is a 71 y.o. female with a hx of moderate OSA with an AHI of 16.3/hr, loud snoring and oxygen desaturations as low as 83%.  She underwent CPAP titration but could not be adequately titrated on CPAP and is now on auto BIPAP and tolerating well.    She is doing well with her PAP device and thinks that she has gotten used to it.  She tolerates the full face mask and feels the pressure is adequate but she still has problems with tolerating the full face mask.  She is a mouth breather but would like to try a nasal mask with chin strap.  She still does not feel rested in the am.  She denies any significant mouth or nasal dryness or nasal congestion.  She does not think that he snores.     Past Medical History:  Diagnosis Date  . Cancer of left breast ([place])   . Complication of anesthesia   . Coronary artery disease    a. 2006 s/p CABG x 4 (LIMA->LAD, VG->Diag->OM, VG->RPDA);  b. 02/2015: [place] s/p DES to [facility], VG->PDA & VG->D1->OM 100; c. 06/2015 Cath: diffuse dzs->Med rx; d. 11/2015 Cath/PCI: LM nl, LAD 100ost, 95d, D1 90ost, LCX 35ost, 65p, OM2 40, lat OM2 90, RCA 95p (2.75x16 Synergy DES), patent mid stent, RPLB 70, LIMA->LAD ok.EF55-65%.  . Depression   . Hx of tobacco use, presenting hazards to health    a. quit 2006.  [name] Hypercholesteremia   . Hypertension   . Hypertensive heart disease   . Left carotid bruit    a. 09/2014 Carotid U/S: 1-39% bilat ICA stenosis.  [name] PAD (peripheral artery disease) ([place])    a. 11/2014 ABI: R: 0.63, L 0.59;  b. 11/2014 Periph Angio: bilat pop occlusions. L - short w/ reconstituion via collats in dist pop w/ 2 vessel runoff, R long w/  reconstitution in prox tib/peroneal arteries-->med Rx w/ pletal.  . Pulmonary embolism ([place]) a. 2014.  [name] Reflux esophagitis   . Type II diabetes mellitus ([place])     Past Surgical History:  Procedure Laterality Date  . BREAST BIOPSY Left ~ 2012  . BUNIONECTOMY Left 1970s  . CARDIAC CATHETERIZATION  04/2004  . CARDIAC CATHETERIZATION N/A 02/21/2015   Procedure: Left Heart Cath and Cors/Grafts Angiography;  Surgeon: Belva Crome, MD;  Location: [place] CV LAB;  Service: Cardiovascular;  Laterality: N/A;  . CARDIAC CATHETERIZATION N/A 02/21/2015   Procedure: Coronary Stent Intervention;  Surgeon: Belva Crome, MD;  Location: [place] CV LAB;  Service: Cardiovascular;  Laterality: N/A;  . CARDIAC CATHETERIZATION N/A 02/21/2015   Procedure: Intravascular Pressure Wire/FFR Study;  Surgeon: Belva Crome, MD;  Location: [place] CV LAB;  Service: Cardiovascular;  Laterality: N/A;  . CARDIAC CATHETERIZATION N/A 06/20/2015   Procedure: Left Heart Cath and Cors/Grafts Angiography;  Surgeon: Larey Dresser, MD;  Location: [place] CV LAB;  Service: Cardiovascular;  Laterality: N/A;  . CARDIAC CATHETERIZATION N/A 11/19/2015   Procedure: Left Heart Cath and Cors/Grafts Angiography;  Surgeon: Peter M Martinique, MD;  Location: [place] CV LAB;  Service: Cardiovascular;  Laterality: N/A;  . CARDIAC CATHETERIZATION N/A 11/19/2015   Procedure: Coronary Stent Intervention;  Surgeon: Peter M Martinique, MD;  Location: [place] CV LAB;  Service: Cardiovascular;  Laterality: N/A;  . CATARACT EXTRACTION W/ INTRAOCULAR LENS  IMPLANT, BILATERAL Bilateral 1980s-1990s  . CORONARY ANGIOPLASTY WITH STENT PLACEMENT  02/21/2015   "1 stent"  . CORONARY ARTERY BYPASS GRAFT  05/2004   "CABG X4"  . EYE SURGERY    . LAPAROSCOPIC CHOLECYSTECTOMY  1990s  . MASTECTOMY Left ~ 2012  . PERIPHERAL VASCULAR CATHETERIZATION N/A 12/12/2014   Procedure: Abdominal Aortogram;  Surgeon: Wellington Hampshire, MD;  Location: [place] CV  LAB;  Service: Cardiovascular;  Laterality: N/A;  . RETINAL DETACHMENT SURGERY Right 1990s    Current Medications: Current Meds  Medication Sig  . acetaminophen (TYLENOL) 500 MG tablet Take 1,000 mg by mouth every 6 (six) hours as needed for headache (pain).   [name] ALPRAZolam (XANAX) 0.25 MG tablet Take 0.25 mg by mouth daily as needed for anxiety.   [name] aspirin 81 MG tablet Take 81 mg by mouth daily.  . Brimonidine Tartrate-Timolol (COMBIGAN OP) Place 1 drop into both eyes 2 (two) times daily.  . clopidogrel (PLAVIX) 75 MG tablet Take 1 tablet (75 mg total) by mouth daily.  . isosorbide mononitrate (IMDUR) 60 MG 24 hr tablet TAKE ONE TABLET BY MOUTH AT BEDTIME.  [name] lisinopril (PRINIVIL,ZESTRIL) 10 MG tablet Take 1 tablet (10 mg total) by mouth daily.  . metFORMIN (GLUCOPHAGE) 500 MG tablet Take 1,000 mg by mouth 2 (two) times daily with a meal.   . nitroGLYCERIN (NITROSTAT) 0.4 MG SL tablet Place 1 tablet (0.4 mg total) under the tongue every 5 (five) minutes as needed for chest pain (MAX 3 doses in 15 minutes).  Vladimir Faster Glycol-Propyl Glycol (SYSTANE OP) Place 1 drop into both eyes daily as needed (dry eyes).  . rosuvastatin (CRESTOR) 20 MG tablet TAKE ONE TABLET BY MOUTH ONCE DAILY AT  6PM  . sertraline (ZOLOFT) 100 MG tablet Take 150 mg by mouth daily.      Allergies:   Niacin and Lipitor [atorvastatin]   Social History   Socioeconomic History  . Marital status: Married    Spouse name: None  . Number of children: None  . Years of education: None  . Highest education level: None  Social Needs  . Financial resource strain: None  . Food insecurity - worry: None  . Food insecurity - inability: None  . Transportation needs - medical: None  . Transportation needs - non-medical: None  Occupational History  . None  Tobacco Use  . Smoking status: Former Smoker    Packs/day: 0.50    Years: 15.00    Pack years: 7.50    Types: Cigarettes    Last attempt to quit: 05/11/2004    Years  since quitting: 12.6  . Smokeless tobacco: Never Used  Substance and Sexual Activity  . Alcohol use: No  . Drug use: No  . Sexual activity: Not Currently  Other Topics Concern  . None  Social History Narrative   Lives in [place] with her husband.     Family History: The patient's family history includes Cancer in her sister; Coronary artery disease (age of onset: 30) in her sister; Coronary artery disease (age of onset: 39) in her mother; Deep vein thrombosis in her cousin; Heart attack (age of onset: 72) in her father.  ROS:   Please see the history of present illness.  ROS  All other systems reviewed and negative.   EKGs/Labs/Other Studies Reviewed:    The following studies were reviewed today: CPAP download  EKG:  EKG is not ordered today.    Recent Labs: 01/27/2016: ALT 13 07/22/2016: TSH 1.850 11/10/2016: BUN 20; Creatinine, Ser 1.21; Hemoglobin 12.0; Platelets 253; Potassium 4.1; Sodium 137   Recent Lipid Panel    Component Value Date/Time   CHOL 86 (L) 01/27/2016 1034   TRIG 134 01/27/2016 1034   HDL 29 (L) 01/27/2016 1034   CHOLHDL 3.0 01/27/2016 1034   CHOLHDL 3.6 09/06/2015 0957   VLDL 20 09/06/2015 0957   LDLCALC 30 01/27/2016 1034    Physical Exam:    VS:  BP 130/64   Pulse 61   Ht 5\' 7"  (1.702 m)   Wt 174 lb 6.4 oz (79.1 kg)   SpO2 98%   BMI 27.31 kg/m     Wt Readings from Last 3 Encounters:  01/04/17 174 lb 6.4 oz (79.1 kg)  09/21/16 170 lb 6.4 oz (77.3 kg)  08/12/16 169 lb (76.7 kg)     GEN:  Well nourished, well developed in no acute distress HEENT: Normal NECK: No JVD; No carotid bruits LYMPHATICS: No lymphadenopathy CARDIAC: RRR, no murmurs, rubs, gallops RESPIRATORY:  Clear to auscultation without rales, wheezing or rhonchi  ABDOMEN: Soft, non-tender, non-distended MUSCULOSKELETAL:  No edema; No deformity  SKIN: Warm and dry NEUROLOGIC:  Alert and oriented x 3 PSYCHIATRIC:  Normal affect   ASSESSMENT:    1. OSA  (obstructive sleep apnea)   2. Essential hypertension    PLAN:    In order of problems listed above:  1.  OSA - the patient is tolerating PAP therapy well without any problems. The PAP download was reviewed today and showed an AHI of 4.4/hr on autoBiPAP with 40% compliance in using more than 4 hours nightly.  The patient has been using and benefiting from CPAP use and will continue to benefit from therapy. I will order her a nasal mask with chin strap and get a download in 4 weeks to see if she tolerates this mask better.  2.  HTN - BP is well controlled on exam today.  She will continue on Lisinopril 10mg  daily.    Medication Adjustments/Labs and Tests Ordered: Current medicines are reviewed at length with the patient today.  Concerns regarding medicines are outlined above.  No orders of the defined types were placed in this encounter.  No orders of the defined types were placed in this encounter.   Signed, Fransico Him, MD  01/04/2017 8:43 AM    [place]

## 2017-01-04 ENCOUNTER — Ambulatory Visit (INDEPENDENT_AMBULATORY_CARE_PROVIDER_SITE_OTHER): Payer: Medicare Other | Admitting: Cardiology

## 2017-01-04 ENCOUNTER — Encounter: Payer: Self-pay | Admitting: Cardiology

## 2017-01-04 VITALS — BP 130/64 | HR 61 | Ht 67.0 in | Wt 174.4 lb

## 2017-01-04 DIAGNOSIS — G4733 Obstructive sleep apnea (adult) (pediatric): Secondary | ICD-10-CM | POA: Diagnosis not present

## 2017-01-04 DIAGNOSIS — I10 Essential (primary) hypertension: Secondary | ICD-10-CM | POA: Diagnosis not present

## 2017-01-04 DIAGNOSIS — I6523 Occlusion and stenosis of bilateral carotid arteries: Secondary | ICD-10-CM

## 2017-01-04 NOTE — Patient Instructions (Signed)

## 2017-01-07 ENCOUNTER — Telehealth: Payer: Self-pay | Admitting: Nurse Practitioner

## 2017-01-07 ENCOUNTER — Telehealth: Payer: Self-pay | Admitting: *Deleted

## 2017-01-07 DIAGNOSIS — G4733 Obstructive sleep apnea (adult) (pediatric): Secondary | ICD-10-CM

## 2017-01-07 NOTE — Telephone Encounter (Signed)
Spoke with patient in regards to a request from her given to Gershon Cull, [place] who called to discuss her sleep therapy. Patient states she is having a tooth extraction next Friday and she wants to know if she should hold her ASA and Plavix. She had PCI with DES to RCA 11/17. I advised her of the protocol that the doctor doing the extraction fax our office requesting clearance to hold medications for the procedure. I advised that we have not received any documentation from her dentist. She states their office is closed until Wednesday 1/2 and her procedure is scheduled for 1/4. I advised that she leave them a message requesting they fax their request to our office. I advised that I will route message to Dr. Acie Fredrickson today for clearance so that we can advise her accordingly once we are clear on the dentists' request. She verbalized understanding and agreement with plan and thanked me for the call.

## 2017-01-07 NOTE — Telephone Encounter (Signed)
-----   Message from [place], [place] sent at 01/04/2017  8:54 AM EST ----- Regarding: DME order Resmed Airfit N20 mask, nasal mask with chin strap. D/L in 4 weeks.

## 2017-01-07 NOTE — Telephone Encounter (Signed)
Order sent to [facility] °

## 2017-01-07 NOTE — Telephone Encounter (Signed)
Agree with note from Michelle Swinyer, RN  

## 2017-01-08 NOTE — Telephone Encounter (Signed)
Called patient to review Dr. Elmarie Shiley clearance regarding holding Plavix for 5 days prior to tooth extraction. I advised her to continue ASA per Dr. Acie Fredrickson. Patient verbalized understanding and agreement and thanked me for the call.

## 2017-01-08 NOTE — Telephone Encounter (Signed)
Pt is at low risk for dental extraction. She may hold her Plavix for 5 days prior to extraction. I would like for her to continue aspirin.

## 2017-01-09 ENCOUNTER — Encounter (HOSPITAL_COMMUNITY): Payer: Self-pay | Admitting: *Deleted

## 2017-01-09 ENCOUNTER — Emergency Department (HOSPITAL_COMMUNITY)
Admission: EM | Admit: 2017-01-09 | Discharge: 2017-01-09 | Disposition: A | Discharge status: Home / Self Care | Payer: Medicare Other | Attending: Emergency Medicine | Admitting: Emergency Medicine

## 2017-01-09 ENCOUNTER — Other Ambulatory Visit: Payer: Self-pay

## 2017-01-09 ENCOUNTER — Emergency Department (HOSPITAL_COMMUNITY): Payer: Medicare Other

## 2017-01-09 DIAGNOSIS — I2510 Atherosclerotic heart disease of native coronary artery without angina pectoris: Secondary | ICD-10-CM | POA: Diagnosis not present

## 2017-01-09 DIAGNOSIS — I110 Hypertensive heart disease with heart failure: Secondary | ICD-10-CM | POA: Diagnosis not present

## 2017-01-09 DIAGNOSIS — Z79899 Other long term (current) drug therapy: Secondary | ICD-10-CM | POA: Insufficient documentation

## 2017-01-09 DIAGNOSIS — E119 Type 2 diabetes mellitus without complications: Secondary | ICD-10-CM | POA: Diagnosis not present

## 2017-01-09 DIAGNOSIS — F329 Major depressive disorder, single episode, unspecified: Secondary | ICD-10-CM | POA: Diagnosis not present

## 2017-01-09 DIAGNOSIS — Z9049 Acquired absence of other specified parts of digestive tract: Secondary | ICD-10-CM | POA: Insufficient documentation

## 2017-01-09 DIAGNOSIS — R1031 Right lower quadrant pain: Secondary | ICD-10-CM | POA: Diagnosis present

## 2017-01-09 DIAGNOSIS — K5732 Diverticulitis of large intestine without perforation or abscess without bleeding: Secondary | ICD-10-CM | POA: Insufficient documentation

## 2017-01-09 DIAGNOSIS — Z87891 Personal history of nicotine dependence: Secondary | ICD-10-CM | POA: Diagnosis not present

## 2017-01-09 DIAGNOSIS — Z7982 Long term (current) use of aspirin: Secondary | ICD-10-CM | POA: Insufficient documentation

## 2017-01-09 DIAGNOSIS — I5032 Chronic diastolic (congestive) heart failure: Secondary | ICD-10-CM | POA: Diagnosis not present

## 2017-01-09 DIAGNOSIS — Z853 Personal history of malignant neoplasm of breast: Secondary | ICD-10-CM | POA: Diagnosis not present

## 2017-01-09 DIAGNOSIS — Z951 Presence of aortocoronary bypass graft: Secondary | ICD-10-CM | POA: Insufficient documentation

## 2017-01-09 DIAGNOSIS — R109 Unspecified abdominal pain: Secondary | ICD-10-CM | POA: Diagnosis not present

## 2017-01-09 DIAGNOSIS — Z7984 Long term (current) use of oral hypoglycemic drugs: Secondary | ICD-10-CM | POA: Insufficient documentation

## 2017-01-09 DIAGNOSIS — K5730 Diverticulosis of large intestine without perforation or abscess without bleeding: Secondary | ICD-10-CM | POA: Diagnosis not present

## 2017-01-09 DIAGNOSIS — R102 Pelvic and perineal pain: Secondary | ICD-10-CM | POA: Diagnosis not present

## 2017-01-09 LAB — COMPREHENSIVE METABOLIC PANEL
ALBUMIN: 4.3 g/dL (ref 3.5–5.0)
ALK PHOS: 51 U/L (ref 38–126)
ALT: 12 U/L — ABNORMAL LOW (ref 14–54)
ANION GAP: 11 (ref 5–15)
AST: 17 U/L (ref 15–41)
BUN: 20 mg/dL (ref 6–20)
CALCIUM: 9.9 mg/dL (ref 8.9–10.3)
CHLORIDE: 101 mmol/L (ref 101–111)
CO2: 26 mmol/L (ref 22–32)
Creatinine, Ser: 1.18 mg/dL — ABNORMAL HIGH (ref 0.44–1.00)
GFR calc non Af Amer: 46 mL/min — ABNORMAL LOW (ref >60)
GFR, EST AFRICAN AMERICAN: 53 mL/min — AB (ref >60)
GLUCOSE: 120 mg/dL — AB (ref 65–99)
Potassium: 3.9 mmol/L (ref 3.5–5.1)
SODIUM: 138 mmol/L (ref 135–145)
Total Bilirubin: 0.8 mg/dL (ref 0.3–1.2)
Total Protein: 8.5 g/dL — ABNORMAL HIGH (ref 6.5–8.1)

## 2017-01-09 LAB — URINALYSIS, ROUTINE W REFLEX MICROSCOPIC
BACTERIA UA: NONE SEEN
BILIRUBIN URINE: NEGATIVE
Glucose, UA: NEGATIVE mg/dL
KETONES UR: 5 mg/dL — AB
Nitrite: NEGATIVE
PH: 5 (ref 5.0–8.0)
PROTEIN: 100 mg/dL — AB
Specific Gravity, Urine: 1.026 (ref 1.005–1.030)

## 2017-01-09 LAB — CBC
HCT: 39.7 % (ref 36.0–46.0)
HEMOGLOBIN: 13.1 g/dL (ref 12.0–15.0)
MCH: 29.9 pg (ref 26.0–34.0)
MCHC: 33 g/dL (ref 30.0–36.0)
MCV: 90.6 fL (ref 78.0–100.0)
Platelets: 294 10*3/uL (ref 150–400)
RBC: 4.38 MIL/uL (ref 3.87–5.11)
RDW: 12.4 % (ref 11.5–15.5)
WBC: 15.3 10*3/uL — ABNORMAL HIGH (ref 4.0–10.5)

## 2017-01-09 LAB — LIPASE, BLOOD: LIPASE: 33 U/L (ref 11–51)

## 2017-01-09 MED ORDER — CIPROFLOXACIN HCL 500 MG PO TABS: 500.0000 mg | ORAL_TABLET | Freq: Two times a day (BID) | ORAL | 0 refills | Status: AC

## 2017-01-09 MED ORDER — MORPHINE SULFATE (PF) 4 MG/ML IV SOLN
4.0000 mg | Freq: Once | INTRAVENOUS | Status: AC
  Administered 2017-01-09: 4 mg via INTRAVENOUS
  Filled 2017-01-09: qty 1

## 2017-01-09 MED ORDER — METRONIDAZOLE 500 MG PO TABS
500.0000 mg | ORAL_TABLET | Freq: Once | ORAL | Status: AC
  Administered 2017-01-09: 500 mg via ORAL
  Filled 2017-01-09: qty 1

## 2017-01-09 MED ORDER — SODIUM CHLORIDE 0.9 % IV BOLUS (SEPSIS)
1000.0000 mL | Freq: Once | INTRAVENOUS | Status: AC
Start: 2017-01-09 — End: 2017-01-09
  Administered 2017-01-09: 1000 mL via INTRAVENOUS

## 2017-01-09 MED ORDER — METRONIDAZOLE 500 MG PO TABS: 500.0000 mg | ORAL_TABLET | Freq: Three times a day (TID) | ORAL | 0 refills | Status: AC

## 2017-01-09 MED ORDER — SODIUM CHLORIDE 0.9 % IJ SOLN
INTRAMUSCULAR | Status: AC
  Filled 2017-01-09: qty 50

## 2017-01-09 MED ORDER — HYDROCODONE-ACETAMINOPHEN 5-325 MG PO TABS: 1.0000 | ORAL_TABLET | Freq: Four times a day (QID) | ORAL | 0 refills | Status: DC | PRN

## 2017-01-09 MED ORDER — IOPAMIDOL (ISOVUE-300) INJECTION 61%
75.0000 mL | Freq: Once | INTRAVENOUS | Status: AC | PRN
  Administered 2017-01-09: 75 mL via INTRAVENOUS

## 2017-01-09 MED ORDER — SODIUM CHLORIDE 0.9 % IV SOLN
INTRAVENOUS | Status: DC
Start: 2017-01-09 — End: 2017-01-10
  Administered 2017-01-09: 21:00:00 via INTRAVENOUS

## 2017-01-09 MED ORDER — CIPROFLOXACIN IN D5W 400 MG/200ML IV SOLN
400.0000 mg | Freq: Two times a day (BID) | INTRAVENOUS | Status: DC
  Administered 2017-01-09: 400 mg via INTRAVENOUS
  Filled 2017-01-09: qty 200

## 2017-01-09 MED ORDER — IOPAMIDOL (ISOVUE-300) INJECTION 61%
INTRAVENOUS | Status: AC
  Filled 2017-01-09: qty 75

## 2017-01-09 NOTE — ED Notes (Signed)
Complication with Cipro drip, will discharge pt as soon as Cipro is finished.

## 2017-01-09 NOTE — ED Notes (Signed)
Urine culture collected and sent to lab as a save tube if needed.

## 2017-01-09 NOTE — ED Triage Notes (Signed)
Pt presents with severe lower abd pain that began mid morning on yesterday (01/08/17).  Pt reports running a fever last night and one episode of emesis.  Pt reports last BM was two days ago.  Pt went to see her PCP [facility]), and was advised to come to the ER for a CT scan.  Pt a/o x 4 and ambulatory.  Hx DM.

## 2017-01-09 NOTE — ED Provider Notes (Signed)
[place] DEPT Provider Note   CSN: [id number] Arrival date & time: 01/09/17  1341     History   Chief Complaint Chief Complaint  Patient presents with  . Abdominal Pain    HPI Taylor [name] is a 71 y.o. female.  The history is provided by the patient.  Abdominal Pain   This is a new problem. The current episode started yesterday. The problem occurs constantly. The problem has not changed since onset.The pain is associated with an unknown factor. The pain is located in the RLQ, suprapubic region and LLQ. The quality of the pain is dull. The pain is moderate. Associated symptoms include fever, nausea and vomiting. Pertinent negatives include diarrhea and constipation. The symptoms are aggravated by activity. Nothing relieves the symptoms. Her past medical history does not include ulcerative colitis.    Past Medical History:  Diagnosis Date  . Cancer of left breast ([place])   . Complication of anesthesia   . Coronary artery disease    a. 2006 s/p CABG x 4 (LIMA->LAD, VG->Diag->OM, VG->RPDA);  b. 02/2015: [place] s/p DES to [facility], VG->PDA & VG->D1->OM 100; c. 06/2015 Cath: diffuse dzs->Med rx; d. 11/2015 Cath/PCI: LM nl, LAD 100ost, 95d, D1 90ost, LCX 35ost, 65p, OM2 40, lat OM2 90, RCA 95p (2.75x16 Synergy DES), patent mid stent, RPLB 70, LIMA->LAD ok.EF55-65%.  . Depression   . Hx of tobacco use, presenting hazards to health    a. quit 2006.  [name] Hypercholesteremia   . Hypertension   . Hypertensive heart disease   . Left carotid bruit    a. 09/2014 Carotid U/S: 1-39% bilat ICA stenosis.  [name] PAD (peripheral artery disease) ([place])    a. 11/2014 ABI: R: 0.63, L 0.59;  b. 11/2014 Periph Angio: bilat pop occlusions. L - short w/ reconstituion via collats in dist pop w/ 2 vessel runoff, R long w/ reconstitution in prox tib/peroneal arteries-->med Rx w/ pletal.  . Pulmonary embolism ([place]) a. 2014.  [name] Reflux esophagitis   . Type II diabetes mellitus [facility])      Patient Active Problem List   Diagnosis Date Noted  . Mixed hyperlipidemia 09/21/2016  . Carotid artery disease ([place]) 12/03/2015  . OSA (obstructive sleep apnea) 12/03/2015  . Pain and swelling of left lower leg 11/17/2015  . Coronary artery disease   . Hypertensive heart disease   . Type II diabetes mellitus ([place])   . PAD (peripheral artery disease) ([place]) 11/03/2014  . Chest pain 08/06/2014  . Nausea with vomiting 05/08/2014  . Allergic reaction 05/08/2014  . Hypercholesteremia   . Hypertension   . Diabetes mellitus ([place])   . Hx of tobacco use, presenting hazards to health 12/21/2013  . Varicose veins of left lower extremity 12/18/2013  . Cardiac enzymes elevated 03/01/2012  . Chronic diastolic heart failure, NYHA class 1 ([place]) 03/01/2012  . Bilateral pulmonary embolism ([place]) 02/29/2012  . 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 03/06/2011  . Chest pain 11/30/2010  . Left carotid bruit 02/26/2009  . CHRONIC LEFT LEG VENOUS INSUFFICIENCY 07/02/2008  . EDEMA 05/14/2008  . HYPERCHOLESTEROLEMIA 05/10/2008  . CAD, ARTERY BYPASS GRAFT 05/10/2008  . ESOPHAGITIS, REFLUX 05/10/2008  . DM (diabetes mellitus) type I uncontrolled, periph vascular disorder ([place]) 10/18/2006  . DEPRESSION 10/18/2006  . Essential hypertension 10/18/2006    Past Surgical History:  Procedure Laterality Date  . BREAST BIOPSY Left ~ 2012  . BUNIONECTOMY Left 1970s  . CARDIAC CATHETERIZATION  04/2004  . CARDIAC CATHETERIZATION  N/A 02/21/2015   Procedure: Left Heart Cath and Cors/Grafts Angiography;  Surgeon: Belva Crome, MD;  Location: [place] CV LAB;  Service: Cardiovascular;  Laterality: N/A;  . CARDIAC CATHETERIZATION N/A 02/21/2015   Procedure: Coronary Stent Intervention;  Surgeon: Belva Crome, MD;  Location: [place] CV LAB;  Service: Cardiovascular;  Laterality: N/A;  . CARDIAC CATHETERIZATION N/A 02/21/2015   Procedure: Intravascular Pressure Wire/FFR Study;  Surgeon:  Belva Crome, MD;  Location: [place] CV LAB;  Service: Cardiovascular;  Laterality: N/A;  . CARDIAC CATHETERIZATION N/A 06/20/2015   Procedure: Left Heart Cath and Cors/Grafts Angiography;  Surgeon: Larey Dresser, MD;  Location: [place] CV LAB;  Service: Cardiovascular;  Laterality: N/A;  . CARDIAC CATHETERIZATION N/A 11/19/2015   Procedure: Left Heart Cath and Cors/Grafts Angiography;  Surgeon: Peter M Martinique, MD;  Location: [place] CV LAB;  Service: Cardiovascular;  Laterality: N/A;  . CARDIAC CATHETERIZATION N/A 11/19/2015   Procedure: Coronary Stent Intervention;  Surgeon: Peter M Martinique, MD;  Location: [place] CV LAB;  Service: Cardiovascular;  Laterality: N/A;  . CATARACT EXTRACTION W/ INTRAOCULAR LENS  IMPLANT, BILATERAL Bilateral 1980s-1990s  . CORONARY ANGIOPLASTY WITH STENT PLACEMENT  02/21/2015   "1 stent"  . CORONARY ARTERY BYPASS GRAFT  05/2004   "CABG X4"  . EYE SURGERY    . LAPAROSCOPIC CHOLECYSTECTOMY  1990s  . MASTECTOMY Left ~ 2012  . PERIPHERAL VASCULAR CATHETERIZATION N/A 12/12/2014   Procedure: Abdominal Aortogram;  Surgeon: Wellington Hampshire, MD;  Location: [place] [facility] LAB;  Service: Cardiovascular;  Laterality: N/A;  . RETINAL DETACHMENT SURGERY Right 1990s    OB History    No data available       Home Medications    Prior to Admission medications   Medication Sig Start Date End Date Taking? Authorizing Provider  acetaminophen (TYLENOL) 500 MG tablet Take 1,000 mg by mouth every 6 (six) hours as needed for headache (pain).    Yes [provider]  ALPRAZolam (XANAX) 0.25 MG tablet Take 0.25 mg by mouth daily as needed for anxiety.    Yes [provider]  aspirin 81 MG tablet Take 81 mg by mouth daily.   Yes [provider]  Brimonidine Tartrate-Timolol (COMBIGAN OP) Place 1 drop into both eyes 2 (two) times daily.   Yes [provider]  clopidogrel (PLAVIX) 75 MG tablet Take 1 tablet (75 mg total) by mouth  daily. 09/21/16  Yes Nahser, Wonda Cheng, MD  isosorbide mononitrate (IMDUR) 60 MG 24 hr tablet TAKE ONE TABLET BY MOUTH AT BEDTIME. 12/24/16  Yes Weaver, Scott T, PA-C  lisinopril (PRINIVIL,ZESTRIL) 10 MG tablet Take 1 tablet (10 mg total) by mouth daily. 07/06/16  Yes Nahser, Wonda Cheng, MD  metFORMIN (GLUCOPHAGE) 500 MG tablet Take 1,000 mg by mouth 2 (two) times daily with a meal.    Yes [provider]  nitroGLYCERIN (NITROSTAT) 0.4 MG SL tablet Place 1 tablet (0.4 mg total) under the tongue every 5 (five) minutes as needed for chest pain (MAX 3 doses in 15 minutes). 11/20/15  Yes Vann, Tomi Bamberger, DO  Polyethyl Glycol-Propyl Glycol (SYSTANE OP) Place 1 drop into both eyes daily as needed (dry eyes).   Yes [provider]  rosuvastatin (CRESTOR) 20 MG tablet TAKE ONE TABLET BY MOUTH ONCE DAILY AT  6PM 12/01/16  Yes Nahser, Wonda Cheng, MD  sertraline (ZOLOFT) 100 MG tablet Take 100 mg by mouth daily.    Yes [provider]  ciprofloxacin (CIPRO) 500 MG tablet Take 1 tablet (500 mg total) by mouth 2 (two) times daily for 10 days. 01/09/17 01/19/17  Dorie Rank, MD  HYDROcodone-acetaminophen (NORCO/VICODIN) 5-325 MG tablet Take 1 tablet by mouth every 6 (six) hours as needed. 01/09/17   Dorie Rank, MD  metroNIDAZOLE (FLAGYL) 500 MG tablet Take 1 tablet (500 mg total) by mouth 3 (three) times daily for 10 days. 01/09/17 01/19/17  Dorie Rank, MD    Family History Family History  Problem Relation Age of Onset  . Coronary artery disease Mother 16       status post CABG   . Heart attack Father 15       deceased secondary to fatal  first myocardial infarction  . Coronary artery disease Sister 61       alive had bypass surgery  . Cancer Sister   . Deep vein thrombosis Cousin        Neice had after knee surgery    Social History Social History   Tobacco Use  . Smoking status: Former Smoker    Packs/day: 0.50    Years: 15.00    Pack years: 7.50    Types: Cigarettes    Last  attempt to quit: 05/11/2004    Years since quitting: 12.6  . Smokeless tobacco: Never Used  Substance Use Topics  . Alcohol use: No  . Drug use: No     Allergies   Niacin and Lipitor [atorvastatin]   Review of Systems Review of Systems  Constitutional: Positive for fever.  Gastrointestinal: Positive for abdominal pain, nausea and vomiting. Negative for constipation and diarrhea.  All other systems reviewed and are negative.    Physical Exam Updated Vital Signs BP (!) 166/64 (BP Location: Right Arm)   Pulse (!) 58   Temp 97.6 F (36.4 C) (Oral)   Resp 14   Ht 1.702 m (5\' 7" )   Wt 79.4 kg (175 lb)   SpO2 97%   BMI 27.41 kg/m   Physical Exam  Constitutional: She appears well-developed and well-nourished. No distress.  HENT:  Head: Normocephalic and atraumatic.  Right Ear: External ear normal.  Left Ear: External ear normal.  Eyes: Conjunctivae are normal. Right eye exhibits no discharge. Left eye exhibits no discharge. No scleral icterus.  Neck: Neck supple. No tracheal deviation present.  Cardiovascular: Normal rate, regular rhythm and intact distal pulses.  Pulmonary/Chest: Effort normal and breath sounds normal. No stridor. No respiratory distress. She has no wheezes. She has no rales.  Abdominal: Soft. Bowel sounds are normal. She exhibits no distension. There is tenderness in the suprapubic area. There is no rebound and no guarding.  Musculoskeletal: She exhibits no edema or tenderness.  Neurological: She is alert. She has normal strength. No cranial nerve deficit (no facial droop, extraocular movements intact, no slurred speech) or sensory deficit. She exhibits normal muscle tone. She displays no seizure activity. Coordination normal.  Skin: Skin is warm and dry. No rash noted.  Psychiatric: She has a normal mood and affect.  Nursing note and vitals reviewed.    ED Treatments / Results  Labs (all labs ordered are listed, but only abnormal results are  displayed) Labs Reviewed  COMPREHENSIVE METABOLIC PANEL - Abnormal; Notable for the following components:      Result Value   Glucose, Bld 120 (*)    Creatinine, Ser 1.18 (*)    Total Protein 8.5 (*)    ALT 12 (*)    GFR calc non Af Wyvonnia Lora  46 (*)    GFR calc Af Amer 53 (*)    All other components within normal limits  CBC - Abnormal; Notable for the following components:   WBC 15.3 (*)    All other components within normal limits  URINALYSIS, ROUTINE W REFLEX MICROSCOPIC - Abnormal; Notable for the following components:   Color, Urine AMBER (*)    Hgb urine dipstick SMALL (*)    Ketones, ur 5 (*)    Protein, ur 100 (*)    Leukocytes, UA TRACE (*)    Squamous Epithelial / LPF 0-5 (*)    All other components within normal limits  LIPASE, BLOOD    EKG  EKG Interpretation None       Radiology Ct Abdomen Pelvis W Contrast  Result Date: 01/09/2017 CLINICAL DATA:  Lower abdominal pain for 2 days EXAM: CT ABDOMEN AND PELVIS WITH CONTRAST TECHNIQUE: Multidetector CT imaging of the abdomen and pelvis was performed using the standard protocol following bolus administration of intravenous contrast. CONTRAST:  72mL ISOVUE-300 IOPAMIDOL (ISOVUE-300) INJECTION 61% COMPARISON:  None. FINDINGS: Lower chest: No acute abnormality.  Small hiatal hernia is noted. Hepatobiliary: No focal liver abnormality is seen. Status post cholecystectomy. No biliary dilatation. Pancreas: Unremarkable. No pancreatic ductal dilatation or surrounding inflammatory changes. Spleen: Normal in size without focal abnormality. Adrenals/Urinary Tract: Adrenal glands are within normal limits. The kidneys are well visualize without renal calculi or obstructive changes. A small cortical cyst is noted in the upper pole of the left kidney. The bladder is decompressed. Stomach/Bowel: Diverticular changes noted with evidence of diverticulitis within the sigmoid colon. No evidence of perforation or abscess formation is noted.  Considerable pericolonic inflammatory changes are noted. The appendix is within normal limits. No obstructive changes are seen. Vascular/Lymphatic: Aortic atherosclerosis. No enlarged abdominal or pelvic lymph nodes. Reproductive: Uterus and bilateral adnexa are unremarkable. Other: No abdominal wall hernia or abnormality. No abdominopelvic ascites. Musculoskeletal: Degenerative changes of lumbar spine are noted. IMPRESSION: Diverticulitis within the sigmoid colon. No perforation or abscess formation is noted at this time. Electronically Signed   By: Inez Catalina M.D.   On: 01/09/2017 20:25    Procedures Procedures (including critical care time)  Medications Ordered in ED Medications  0.9 %  sodium chloride infusion ( Intravenous New Bag/Given 01/09/17 2042)  iopamidol (ISOVUE-300) 61 % injection (not administered)  sodium chloride 0.9 % injection (not administered)  ciprofloxacin (CIPRO) IVPB 400 mg (400 mg Intravenous New Bag/Given 01/09/17 2041)  sodium chloride 0.9 % bolus 1,000 mL (0 mLs Intravenous Stopped 01/09/17 2037)  iopamidol (ISOVUE-300) 61 % injection 75 mL (75 mLs Intravenous Contrast Given 01/09/17 1948)  metroNIDAZOLE (FLAGYL) tablet 500 mg (500 mg Oral Given 01/09/17 2041)  morphine 4 MG/ML injection 4 mg (4 mg Intravenous Given 01/09/17 2047)     Initial Impression / Assessment and Plan / ED Course  I have reviewed the triage vital signs and the nursing notes.  Pertinent labs & imaging results that were available during my care of the patient were reviewed by me and considered in my medical decision making (see chart for details).   Patient presented to the emergency room for evaluation of lower abdominal pain.  Patient's laboratory tests were notable for an elevated white blood cell count.  CT scan was performed.  It shows acute diverticulitis without any complicating features of abscess or perforation.  Patient is not having any trouble with nausea and vomiting.  She is  afebrile.  I think it is reasonable  to try outpatient management.  She was given a dose of Cipro and Flagyl in the emergency room.  Plan on discharge home with oral antibiotics and medications for pain.  Warning signs and precautions discussed.  Final Clinical Impressions(s) / ED Diagnoses   Final diagnoses:  Diverticulitis large intestine w/o perforation or abscess w/o bleeding    ED Discharge Orders        Ordered    ciprofloxacin (CIPRO) 500 MG tablet  2 times daily     01/09/17 2135    metroNIDAZOLE (FLAGYL) 500 MG tablet  3 times daily     01/09/17 2135    HYDROcodone-acetaminophen (NORCO/VICODIN) 5-325 MG tablet  Every 6 hours PRN     01/09/17 2135       Dorie Rank, MD 01/09/17 2138

## 2017-01-09 NOTE — Discharge Instructions (Signed)
Take the antibiotics as prescribed.  Follow-up with your doctor next week to make sure you are improving.  Return for fever, vomiting, worsening symptoms

## 2017-01-18 DIAGNOSIS — K5792 Diverticulitis of intestine, part unspecified, without perforation or abscess without bleeding: Secondary | ICD-10-CM | POA: Diagnosis not present

## 2017-01-18 DIAGNOSIS — D72829 Elevated white blood cell count, unspecified: Secondary | ICD-10-CM | POA: Diagnosis not present

## 2017-01-18 DIAGNOSIS — Z09 Encounter for follow-up examination after completed treatment for conditions other than malignant neoplasm: Secondary | ICD-10-CM | POA: Diagnosis not present

## 2017-02-18 DIAGNOSIS — M1990 Unspecified osteoarthritis, unspecified site: Secondary | ICD-10-CM | POA: Diagnosis not present

## 2017-02-22 ENCOUNTER — Emergency Department (HOSPITAL_COMMUNITY)
Admission: EM | Admit: 2017-02-22 | Discharge: 2017-02-23 | Disposition: A | Discharge status: Home / Self Care | Payer: Medicare Other | Attending: Emergency Medicine | Admitting: Emergency Medicine

## 2017-02-22 ENCOUNTER — Emergency Department (HOSPITAL_COMMUNITY): Payer: Medicare Other

## 2017-02-22 ENCOUNTER — Encounter (HOSPITAL_COMMUNITY): Payer: Self-pay | Admitting: Emergency Medicine

## 2017-02-22 DIAGNOSIS — K5730 Diverticulosis of large intestine without perforation or abscess without bleeding: Secondary | ICD-10-CM | POA: Diagnosis not present

## 2017-02-22 DIAGNOSIS — Z7984 Long term (current) use of oral hypoglycemic drugs: Secondary | ICD-10-CM | POA: Diagnosis not present

## 2017-02-22 DIAGNOSIS — Z79899 Other long term (current) drug therapy: Secondary | ICD-10-CM | POA: Insufficient documentation

## 2017-02-22 DIAGNOSIS — E1051 Type 1 diabetes mellitus with diabetic peripheral angiopathy without gangrene: Secondary | ICD-10-CM | POA: Insufficient documentation

## 2017-02-22 DIAGNOSIS — Z7902 Long term (current) use of antithrombotics/antiplatelets: Secondary | ICD-10-CM | POA: Insufficient documentation

## 2017-02-22 DIAGNOSIS — Z951 Presence of aortocoronary bypass graft: Secondary | ICD-10-CM | POA: Diagnosis not present

## 2017-02-22 DIAGNOSIS — T450X5A Adverse effect of antiallergic and antiemetic drugs, initial encounter: Secondary | ICD-10-CM | POA: Diagnosis not present

## 2017-02-22 DIAGNOSIS — R112 Nausea with vomiting, unspecified: Secondary | ICD-10-CM | POA: Insufficient documentation

## 2017-02-22 DIAGNOSIS — Z853 Personal history of malignant neoplasm of breast: Secondary | ICD-10-CM | POA: Diagnosis not present

## 2017-02-22 DIAGNOSIS — Z7982 Long term (current) use of aspirin: Secondary | ICD-10-CM | POA: Insufficient documentation

## 2017-02-22 DIAGNOSIS — I2510 Atherosclerotic heart disease of native coronary artery without angina pectoris: Secondary | ICD-10-CM | POA: Insufficient documentation

## 2017-02-22 DIAGNOSIS — Z87891 Personal history of nicotine dependence: Secondary | ICD-10-CM | POA: Insufficient documentation

## 2017-02-22 DIAGNOSIS — T7840XA Allergy, unspecified, initial encounter: Secondary | ICD-10-CM

## 2017-02-22 DIAGNOSIS — R1084 Generalized abdominal pain: Secondary | ICD-10-CM | POA: Diagnosis not present

## 2017-02-22 DIAGNOSIS — I10 Essential (primary) hypertension: Secondary | ICD-10-CM | POA: Insufficient documentation

## 2017-02-22 DIAGNOSIS — R109 Unspecified abdominal pain: Secondary | ICD-10-CM | POA: Diagnosis not present

## 2017-02-22 DIAGNOSIS — R1013 Epigastric pain: Secondary | ICD-10-CM | POA: Diagnosis not present

## 2017-02-22 LAB — I-STAT TROPONIN, ED: TROPONIN I, POC: 0 ng/mL (ref 0.00–0.08)

## 2017-02-22 LAB — CBC
HCT: 40.5 % (ref 36.0–46.0)
HEMOGLOBIN: 13.3 g/dL (ref 12.0–15.0)
MCH: 30 pg (ref 26.0–34.0)
MCHC: 32.8 g/dL (ref 30.0–36.0)
MCV: 91.2 fL (ref 78.0–100.0)
Platelets: 240 10*3/uL (ref 150–400)
RBC: 4.44 MIL/uL (ref 3.87–5.11)
RDW: 12.7 % (ref 11.5–15.5)
WBC: 5.1 10*3/uL (ref 4.0–10.5)

## 2017-02-22 LAB — BASIC METABOLIC PANEL
Anion gap: 12 (ref 5–15)
BUN: 21 mg/dL — ABNORMAL HIGH (ref 6–20)
CALCIUM: 9.7 mg/dL (ref 8.9–10.3)
CO2: 25 mmol/L (ref 22–32)
CREATININE: 1.33 mg/dL — AB (ref 0.44–1.00)
Chloride: 104 mmol/L (ref 101–111)
GFR calc non Af Amer: 39 mL/min — ABNORMAL LOW (ref >60)
GFR, EST AFRICAN AMERICAN: 46 mL/min — AB (ref >60)
Glucose, Bld: 89 mg/dL (ref 65–99)
Potassium: 4.2 mmol/L (ref 3.5–5.1)
Sodium: 141 mmol/L (ref 135–145)

## 2017-02-22 LAB — LIPASE, BLOOD: Lipase: 61 U/L — ABNORMAL HIGH (ref 11–51)

## 2017-02-22 MED ORDER — GI COCKTAIL ~~LOC~~
30.0000 mL | Freq: Once | ORAL | Status: DC
  Filled 2017-02-22: qty 30

## 2017-02-22 NOTE — ED Triage Notes (Signed)
BIB EMS from home, called out for epigastric pain with radiation to back X 2 hrs. Also had episode of N/V en route. Pt had tooth pulled this AM, started on abx and motrin. Pain has improved en route, given 4mg  Zofran for N/V. VSS. EKG WNL

## 2017-02-23 ENCOUNTER — Emergency Department (HOSPITAL_COMMUNITY): Payer: Medicare Other

## 2017-02-23 ENCOUNTER — Other Ambulatory Visit: Payer: Self-pay

## 2017-02-23 DIAGNOSIS — R1084 Generalized abdominal pain: Secondary | ICD-10-CM | POA: Diagnosis not present

## 2017-02-23 DIAGNOSIS — K5730 Diverticulosis of large intestine without perforation or abscess without bleeding: Secondary | ICD-10-CM | POA: Diagnosis not present

## 2017-02-23 LAB — I-STAT TROPONIN, ED: TROPONIN I, POC: 0.01 ng/mL (ref 0.00–0.08)

## 2017-02-23 MED ORDER — CLINDAMYCIN HCL 150 MG PO CAPS: 450.0000 mg | ORAL_CAPSULE | Freq: Three times a day (TID) | ORAL | 0 refills | Status: DC

## 2017-02-23 MED ORDER — FAMOTIDINE 20 MG PO TABS: 20.0000 mg | ORAL_TABLET | Freq: Two times a day (BID) | ORAL | 0 refills | Status: DC

## 2017-02-23 MED ORDER — IOPAMIDOL (ISOVUE-300) INJECTION 61%
INTRAVENOUS | Status: AC
  Administered 2017-02-23: 100 mL
  Filled 2017-02-23: qty 100

## 2017-02-23 MED ORDER — METHYLPREDNISOLONE SODIUM SUCC 125 MG IJ SOLR
125.0000 mg | Freq: Once | INTRAMUSCULAR | Status: AC
Start: 2017-02-23 — End: 2017-02-23
  Administered 2017-02-23: 125 mg via INTRAVENOUS
  Filled 2017-02-23: qty 2

## 2017-02-23 MED ORDER — SODIUM CHLORIDE 0.9 % IV BOLUS (SEPSIS)
500.0000 mL | Freq: Once | INTRAVENOUS | Status: AC
  Administered 2017-02-23: 500 mL via INTRAVENOUS

## 2017-02-23 MED ORDER — FAMOTIDINE IN NACL 20-0.9 MG/50ML-% IV SOLN
20.0000 mg | Freq: Once | INTRAVENOUS | Status: AC
  Administered 2017-02-23: 20 mg via INTRAVENOUS
  Filled 2017-02-23: qty 50

## 2017-02-23 MED ORDER — PREDNISONE 20 MG PO TABS: 40.0000 mg | ORAL_TABLET | Freq: Every day | ORAL | 0 refills | Status: DC

## 2017-02-23 NOTE — ED Provider Notes (Signed)
[facility] EMERGENCY DEPARTMENT Provider Note   CSN: [id number] Arrival date & time: 02/22/17  2028     History   Chief Complaint Chief Complaint  Patient presents with  . Epigastric Pain    HPI Taylor [name] is a 72 y.o. female with a hx of CAD (CABG in 2006 and stents - previously on plavix but off 4 days for today's surgery), HTN, breast cancer (in remission), NIDDM, PE (4 years ago) presents to the Emergency Department complaining of acute, persistent, improving abd pain onset around 6pm.  Pt reports she had a tooth extracted today and was prescribed amoxicillin, ibuprofen and hydrocodone.  Pt reports approx 2 hours later, pt began to have itching and redness of the bilateral arms. Pt took 2 tabs of generic benadryl.  Pt reports after that she began to feel poorly and developed generalized abd pain that radiated to her back.  Pt reports 10/10 pain with nausea and vomiting x2.  Pt reports emesis was NBNB.  EMS was contact and gave zofran with some improvement.  Pt denied known known alleviating factors. Pt reports significant improvement in abd and back pain.  Pt denies fever, chills, yesterday during the day headache, neck pain, chest pain, shortness of breath, wheezing, weakness, dizziness, syncope, dysuria.     The history is provided by the patient, the spouse and medical records. No language interpreter was used.    Past Medical History:  Diagnosis Date  . Cancer of left breast ([place])   . Complication of anesthesia   . Coronary artery disease    a. 2006 s/p CABG x 4 (LIMA->LAD, VG->Diag->OM, VG->RPDA);  b. 02/2015: [place] s/p DES to [facility], VG->PDA & VG->D1->OM 100; c. 06/2015 Cath: diffuse dzs->Med rx; d. 11/2015 Cath/PCI: LM nl, LAD 100ost, 95d, D1 90ost, LCX 35ost, 65p, OM2 40, lat OM2 90, RCA 95p (2.75x16 Synergy DES), patent mid stent, RPLB 70, LIMA->LAD ok.EF55-65%.  . Depression   . Hx of tobacco use, presenting hazards to health    a. quit 2006.  [name]  Hypercholesteremia   . Hypertension   . Hypertensive heart disease   . Left carotid bruit    a. 09/2014 Carotid U/S: 1-39% bilat ICA stenosis.  [name] PAD (peripheral artery disease) ([place])    a. 11/2014 ABI: R: 0.63, L 0.59;  b. 11/2014 Periph Angio: bilat pop occlusions. L - short w/ reconstituion via collats in dist pop w/ 2 vessel runoff, R long w/ reconstitution in prox tib/peroneal arteries-->med Rx w/ pletal.  . Pulmonary embolism ([place]) a. 2014.  [name] Reflux esophagitis   . Type II diabetes mellitus [facility])     Patient Active Problem List   Diagnosis Date Noted  . Mixed hyperlipidemia 09/21/2016  . Carotid artery disease ([place]) 12/03/2015  . OSA (obstructive sleep apnea) 12/03/2015  . Pain and swelling of left lower leg 11/17/2015  . Coronary artery disease   . Hypertensive heart disease   . Type II diabetes mellitus ([place])   . PAD (peripheral artery disease) ([place]) 11/03/2014  . Chest pain 08/06/2014  . Nausea with vomiting 05/08/2014  . Allergic reaction 05/08/2014  . Hypercholesteremia   . Hypertension   . Diabetes mellitus ([place])   . Hx of tobacco use, presenting hazards to health 12/21/2013  . Varicose veins of left lower extremity 12/18/2013  . Cardiac enzymes elevated 03/01/2012  . Chronic diastolic heart failure, NYHA class 1 ([place]) 03/01/2012  . Bilateral pulmonary embolism ([place]) 02/29/2012  . 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 03/06/2011  . Chest pain 11/30/2010  . Left carotid bruit 02/26/2009  . CHRONIC LEFT LEG VENOUS INSUFFICIENCY 07/02/2008  . EDEMA 05/14/2008  . HYPERCHOLESTEROLEMIA 05/10/2008  . CAD, ARTERY BYPASS GRAFT 05/10/2008  . ESOPHAGITIS, REFLUX 05/10/2008  . DM (diabetes mellitus) type I uncontrolled, periph vascular disorder ([place]) 10/18/2006  . DEPRESSION 10/18/2006  . Essential hypertension 10/18/2006    Past Surgical History:  Procedure Laterality Date  . BREAST BIOPSY Left ~ 2012  . BUNIONECTOMY Left 1970s  . CARDIAC  CATHETERIZATION  04/2004  . CARDIAC CATHETERIZATION N/A 02/21/2015   Procedure: Left Heart Cath and Cors/Grafts Angiography;  Surgeon: Belva Crome, MD;  Location: [place] CV LAB;  Service: Cardiovascular;  Laterality: N/A;  . CARDIAC CATHETERIZATION N/A 02/21/2015   Procedure: Coronary Stent Intervention;  Surgeon: Belva Crome, MD;  Location: [place] CV LAB;  Service: Cardiovascular;  Laterality: N/A;  . CARDIAC CATHETERIZATION N/A 02/21/2015   Procedure: Intravascular Pressure Wire/FFR Study;  Surgeon: Belva Crome, MD;  Location: [place] CV LAB;  Service: Cardiovascular;  Laterality: N/A;  . CARDIAC CATHETERIZATION N/A 06/20/2015   Procedure: Left Heart Cath and Cors/Grafts Angiography;  Surgeon: Larey Dresser, MD;  Location: [place] CV LAB;  Service: Cardiovascular;  Laterality: N/A;  . CARDIAC CATHETERIZATION N/A 11/19/2015   Procedure: Left Heart Cath and Cors/Grafts Angiography;  Surgeon: Peter M Martinique, MD;  Location: [place] CV LAB;  Service: Cardiovascular;  Laterality: N/A;  . CARDIAC CATHETERIZATION N/A 11/19/2015   Procedure: Coronary Stent Intervention;  Surgeon: Peter M Martinique, MD;  Location: [place] CV LAB;  Service: Cardiovascular;  Laterality: N/A;  . CATARACT EXTRACTION W/ INTRAOCULAR LENS  IMPLANT, BILATERAL Bilateral 1980s-1990s  . CORONARY ANGIOPLASTY WITH STENT PLACEMENT  02/21/2015   "1 stent"  . CORONARY ARTERY BYPASS GRAFT  05/2004   "CABG X4"  . EYE SURGERY    . LAPAROSCOPIC CHOLECYSTECTOMY  1990s  . MASTECTOMY Left ~ 2012  . PERIPHERAL VASCULAR CATHETERIZATION N/A 12/12/2014   Procedure: Abdominal Aortogram;  Surgeon: Wellington Hampshire, MD;  Location: [place] CV LAB;  Service: Cardiovascular;  Laterality: N/A;  . RETINAL DETACHMENT SURGERY Right 1990s    OB History    No data available       Home Medications    Prior to Admission medications   Medication Sig Start Date End Date Taking? Authorizing Provider  acetaminophen (TYLENOL) 500  MG tablet Take 1,000 mg by mouth every 6 (six) hours as needed for headache (pain).    Yes [provider]  ALPRAZolam (XANAX) 0.25 MG tablet Take 0.25 mg by mouth daily as needed for anxiety.    Yes [provider]  aspirin 81 MG tablet Take 81 mg by mouth daily.   Yes [provider]  brimonidine-timolol (COMBIGAN) 0.2-0.5 % ophthalmic solution Place 1 drop into both eyes every 12 (twelve) hours.   Yes [provider]  clopidogrel (PLAVIX) 75 MG tablet Take 1 tablet (75 mg total) by mouth daily. 09/21/16  Yes Nahser, Wonda Cheng, MD  isosorbide mononitrate (IMDUR) 60 MG 24 hr tablet TAKE ONE TABLET BY MOUTH AT BEDTIME. 12/24/16  Yes Weaver, Scott T, PA-C  lisinopril (PRINIVIL,ZESTRIL) 10 MG tablet Take 1 tablet (10 mg total) by mouth daily. 07/06/16  Yes Nahser, Wonda Cheng, MD  metFORMIN (GLUCOPHAGE) 500 MG tablet Take 1,000 mg by mouth 2 (two) times daily with a meal.    Yes [provider]  nitroGLYCERIN (NITROSTAT)  0.4 MG SL tablet Place 1 tablet (0.4 mg total) under the tongue every 5 (five) minutes as needed for chest pain (MAX 3 doses in 15 minutes). 11/20/15  Yes Vann, Jessica U, DO  rosuvastatin (CRESTOR) 20 MG tablet TAKE ONE TABLET BY MOUTH ONCE DAILY AT  6PM 12/01/16  Yes Nahser, Wonda Cheng, MD  sertraline (ZOLOFT) 100 MG tablet Take 100 mg by mouth daily.    Yes [provider]  clindamycin (CLEOCIN) 150 MG capsule Take 3 capsules (450 mg total) by mouth 3 (three) times daily. 02/23/17   Suly Vukelich, Jarrett Soho, PA-C  famotidine (PEPCID) 20 MG tablet Take 1 tablet (20 mg total) by mouth 2 (two) times daily. 02/23/17   Brynley Cuddeback, Jarrett Soho, PA-C  predniSONE (DELTASONE) 20 MG tablet Take 2 tablets (40 mg total) by mouth daily. 02/23/17   Adine Heimann, Jarrett Soho, PA-C    Family History Family History  Problem Relation Age of Onset  . Coronary artery disease Mother 35       status post CABG   . Heart attack Father 69       deceased secondary to fatal   first myocardial infarction  . Coronary artery disease Sister 19       alive had bypass surgery  . Cancer Sister   . Deep vein thrombosis Cousin        Neice had after knee surgery    Social History Social History   Tobacco Use  . Smoking status: Former Smoker    Packs/day: 0.50    Years: 15.00    Pack years: 7.50    Types: Cigarettes    Last attempt to quit: 05/11/2004    Years since quitting: 12.7  . Smokeless tobacco: Never Used  Substance Use Topics  . Alcohol use: No  . Drug use: No     Allergies   Niacin and Lipitor [atorvastatin]   Review of Systems Review of Systems  Constitutional: Negative for appetite change, diaphoresis, fatigue, fever and unexpected weight change.  HENT: Negative for mouth sores.   Eyes: Negative for visual disturbance.  Respiratory: Negative for cough, chest tightness, shortness of breath and wheezing.   Cardiovascular: Negative for chest pain.  Gastrointestinal: Positive for abdominal pain, nausea and vomiting. Negative for constipation and diarrhea.  Endocrine: Negative for polydipsia, polyphagia and polyuria.  Genitourinary: Negative for dysuria, frequency, hematuria and urgency.  Musculoskeletal: Negative for back pain and neck stiffness.  Skin: Positive for rash.       itching  Allergic/Immunologic: Negative for immunocompromised state.  Neurological: Negative for syncope, light-headedness and headaches.  Hematological: Does not bruise/bleed easily.  Psychiatric/Behavioral: Negative for sleep disturbance. The patient is not nervous/anxious.      Physical Exam Updated Vital Signs BP (!) 100/46 (BP Location: Right Arm)   Pulse (!) 54   Temp 98 F (36.7 C) (Oral)   Resp 18   Ht 5\' 7"  (1.702 m)   Wt 76.2 kg (168 lb)   SpO2 98%   BMI 26.31 kg/m   Physical Exam  Constitutional: She appears well-developed and well-nourished. No distress.  Awake, alert, nontoxic appearance  HENT:  Head: Normocephalic and atraumatic.    Mouth/Throat: Oropharynx is clear and moist. No oropharyngeal exudate.  Mild edema of the left lower eyelid without erythema, induration, increased warmth or tenderness. Left lower socket at site of tooth removal with stitches intact.  No erythema, swelling or purulent drainage. No induration, tenderness or swelling to the floor of the mouth.  Eyes: Conjunctivae are normal.  No scleral icterus.  Neck: Normal range of motion. Neck supple.  Cardiovascular: Normal rate, regular rhythm and intact distal pulses.  Pulmonary/Chest: Effort normal and breath sounds normal. No respiratory distress. She has no wheezes.  Equal chest expansion  Abdominal: Soft. Bowel sounds are normal. She exhibits no mass. There is generalized tenderness and tenderness in the epigastric area. There is no rigidity, no rebound, no guarding, no CVA tenderness, no tenderness at McBurney's point and negative Murphy's sign.  Generalized abdominal tenderness, worse in the epigastrium without rebound or guarding  Musculoskeletal: Normal range of motion. She exhibits no edema.  Neurological: She is alert.  Speech is clear and goal oriented Moves extremities without ataxia  Skin: Skin is warm and dry. She is not diaphoretic.  Excoriations to the bilateral hands arms and right leg  Psychiatric: She has a normal mood and affect.  Nursing note and vitals reviewed.    ED Treatments / Results  Labs (all labs ordered are listed, but only abnormal results are displayed) Labs Reviewed  LIPASE, BLOOD - Abnormal; Notable for the following components:      Result Value   Lipase 61 (*)    All other components within normal limits  BASIC METABOLIC PANEL - Abnormal; Notable for the following components:   BUN 21 (*)    Creatinine, Ser 1.33 (*)    GFR calc non Af Amer 39 (*)    GFR calc Af Amer 46 (*)    All other components within normal limits  CBC  I-STAT TROPONIN, ED  I-STAT TROPONIN, ED    EKG  EKG  Interpretation  Date/Time:  Monday February 22 2017 20:45:44 EST Ventricular Rate:  86 PR Interval:  138 QRS Duration: 92 QT Interval:  382 QTC Calculation: 457 R Axis:   -41 Text Interpretation:  Sinus rhythm with marked sinus arrhythmia Left axis deviation Incomplete right bundle branch block Nonspecific ST and T wave abnormality Abnormal ECG When compared with ECG of 11/10/2016, Nonspecific ST and T wave abnormality is now present Confirmed by Delora Fuel ([id number]) on 02/22/2017 11:51:00 PM       Radiology Dg Chest 2 View  Result Date: 02/22/2017 CLINICAL DATA:  72 year old female with a history epigastric pain and nausea vomiting EXAM: CHEST  2 VIEW COMPARISON:  11/17/2015, CT chest 11/10/2016 FINDINGS: Cardiomediastinal silhouette unchanged in size and contour. Surgical changes of median sternotomy. No evidence of central vascular congestion. No interlobular septal thickening. No pleural effusion, pneumothorax, or confluent airspace disease. Evidence of prior PTCA. No displaced fracture.  Degenerative changes of the spine. IMPRESSION: No radiographic evidence of acute cardiopulmonary disease. Surgical changes of median sternotomy. Electronically Signed   By: Corrie Mckusick D.O.   On: 02/22/2017 21:18   Ct Abdomen Pelvis W Contrast  Result Date: 02/23/2017 CLINICAL DATA:  Epigastric pain radiating to the back with nausea and vomiting. EXAM: CT ABDOMEN AND PELVIS WITH CONTRAST TECHNIQUE: Multidetector CT imaging of the abdomen and pelvis was performed using the standard protocol following bolus administration of intravenous contrast. CONTRAST:  80 cc ISOVUE-300 IOPAMIDOL (ISOVUE-300) INJECTION 61% COMPARISON:  01/09/2017 FINDINGS: Lower chest: The included heart is top-normal in size. No active pulmonary disease. Small hiatal hernia. Hepatobiliary: No focal liver abnormality is seen. Status post cholecystectomy. No biliary dilatation. Pancreas: Unremarkable. No pancreatic ductal dilatation or  surrounding inflammatory changes. Spleen: Normal in size without focal abnormality. Adrenals/Urinary Tract: Stable small cortical cyst in the upper pole the left kidney. Normal bilateral adrenal glands. No obstructive uropathy.  No hydroureteronephrosis. Unremarkable appearance of the bladder. Stomach/Bowel: There is sigmoid diverticulosis without acute diverticulitis. No bowel inflammation or obstruction. Normal appendix. Vascular/Lymphatic: Moderate aortoiliac and branch vessel atherosclerosis. No enlarged abdominal or pelvic lymph nodes. Reproductive: Uterus and bilateral adnexa are unremarkable. Other: Small periumbilical fat containing hernia. No free air nor free fluid. Musculoskeletal: Mild degenerative change of the included thoracolumbar spine. IMPRESSION: Colonic diverticulosis without acute diverticulitis. No bowel inflammation or obstruction. Electronically Signed   By: Ashley Royalty M.D.   On: 02/23/2017 02:00    Procedures Procedures (including critical care time)  Medications Ordered in ED Medications  gi cocktail (Maalox,Lidocaine,Donnatal) (30 mLs Oral Not Given 02/23/17 0031)  methylPREDNISolone sodium succinate (SOLU-MEDROL) 125 mg/2 mL injection 125 mg (125 mg Intravenous Given 02/23/17 0151)  famotidine (PEPCID) IVPB 20 mg premix (0 mg Intravenous Stopped 02/23/17 0230)  sodium chloride 0.9 % bolus 500 mL (0 mLs Intravenous Stopped 02/23/17 0231)  iopamidol (ISOVUE-300) 61 % injection (100 mLs  Contrast Given 02/23/17 0123)     Initial Impression / Assessment and Plan / ED Course  I have reviewed the triage vital signs and the nursing notes.  Pertinent labs & imaging results that were available during my care of the patient were reviewed by me and considered in my medical decision making (see chart for details).     Patient presents with complaints of abdominal pain and vomiting.  Upon additional questioning, it appears the patient has had an allergic reaction to 1 of her  medications.  Culprits are either ibuprofen or amoxicillin.  Patient's symptoms most consistent with 2 body systems including rash and emesis.  Additionally, upon arrival patient's blood pressure is 90/40.  Upon my assessment of her her blood pressure has increased.  She is not tachycardic.  Her rash has resolved and she is no longer vomiting.  Due to this and her extensive cardiac history she was not given epinephrine.  She has no feeling of throat closing.  Patient given Benadryl at home, prednisone and Pepcid here in the emergency department.  Generalized abdominal tenderness and elevated lipase prompted CT scan.  I personally reviewed these films.  No acute abnormalities noted.  Specifically, no evidence of pancreatitis.  Chest x-ray without evidence of pneumonia, pneumothorax or pulmonary edema.  Throughout her time here in the emergency department, patient's pain improved without pain control.  She has tolerated p.o. without difficulty.  Initial and repeat troponin were negative.  No evidence of cardiac etiology.  Doubt ACS.  Patient will be discharged home with medications for allergic reaction.  She is to stop her ibuprofen and amoxicillin.  Will initiate clindamycin for post dental procedure.   The patient was discussed with and seen by Dr. Wyvonnia Dusky who agrees with the treatment plan.  Final Clinical Impressions(s) / ED Diagnoses   Final diagnoses:  Generalized abdominal pain  Allergic reaction to drug, initial encounter  Non-intractable vomiting with nausea, unspecified vomiting type    ED Discharge Orders        Ordered    clindamycin (CLEOCIN) 150 MG capsule  3 times daily     02/23/17 0417    predniSONE (DELTASONE) 20 MG tablet  Daily     02/23/17 0417    famotidine (PEPCID) 20 MG tablet  2 times daily     02/23/17 0417       Marna Weniger, Jarrett Soho, PA-C 02/23/17 0448    Ezequiel Essex, MD 02/23/17 2226

## 2017-02-23 NOTE — Discharge Instructions (Signed)
1. Medications: Prednisone, Benadryl, Pepcid, clindamycin, usual home medications - STOP ibuprofen and amoxicillin 2. Treatment: rest, drink plenty of fluids, take medications as prescribed 3. Follow Up: Please followup with your primary doctor in 3 days for discussion of your diagnoses and further evaluation after today's visit; if you do not have a primary care doctor use the resource guide provided to find one; followup with dermatology as needed; Return to the ER for difficulty breathing, return of allergic reaction or other concerning symptoms

## 2017-02-23 NOTE — ED Notes (Signed)
Patient transported to CT 

## 2017-03-12 DIAGNOSIS — F419 Anxiety disorder, unspecified: Secondary | ICD-10-CM | POA: Diagnosis not present

## 2017-03-12 DIAGNOSIS — M179 Osteoarthritis of knee, unspecified: Secondary | ICD-10-CM | POA: Diagnosis not present

## 2017-03-12 DIAGNOSIS — Z7984 Long term (current) use of oral hypoglycemic drugs: Secondary | ICD-10-CM | POA: Diagnosis not present

## 2017-03-12 DIAGNOSIS — E1142 Type 2 diabetes mellitus with diabetic polyneuropathy: Secondary | ICD-10-CM | POA: Diagnosis not present

## 2017-03-22 DIAGNOSIS — M1711 Unilateral primary osteoarthritis, right knee: Secondary | ICD-10-CM | POA: Diagnosis not present

## 2017-03-22 DIAGNOSIS — M25561 Pain in right knee: Secondary | ICD-10-CM | POA: Diagnosis not present

## 2017-04-28 ENCOUNTER — Encounter (INDEPENDENT_AMBULATORY_CARE_PROVIDER_SITE_OTHER): Payer: Medicare Other | Admitting: Ophthalmology

## 2017-04-28 DIAGNOSIS — H35033 Hypertensive retinopathy, bilateral: Secondary | ICD-10-CM

## 2017-04-28 DIAGNOSIS — E113591 Type 2 diabetes mellitus with proliferative diabetic retinopathy without macular edema, right eye: Secondary | ICD-10-CM

## 2017-04-28 DIAGNOSIS — E113292 Type 2 diabetes mellitus with mild nonproliferative diabetic retinopathy without macular edema, left eye: Secondary | ICD-10-CM

## 2017-04-28 DIAGNOSIS — H43813 Vitreous degeneration, bilateral: Secondary | ICD-10-CM

## 2017-04-28 DIAGNOSIS — E11319 Type 2 diabetes mellitus with unspecified diabetic retinopathy without macular edema: Secondary | ICD-10-CM | POA: Diagnosis not present

## 2017-04-28 DIAGNOSIS — I10 Essential (primary) hypertension: Secondary | ICD-10-CM

## 2017-05-05 ENCOUNTER — Encounter (INDEPENDENT_AMBULATORY_CARE_PROVIDER_SITE_OTHER): Payer: Medicare Other | Admitting: Ophthalmology

## 2017-05-11 ENCOUNTER — Ambulatory Visit: Payer: Medicare Other | Admitting: Cardiovascular Disease

## 2017-05-18 ENCOUNTER — Encounter: Payer: Self-pay | Admitting: Cardiovascular Disease

## 2017-05-18 ENCOUNTER — Ambulatory Visit (INDEPENDENT_AMBULATORY_CARE_PROVIDER_SITE_OTHER): Payer: Medicare Other | Admitting: Cardiovascular Disease

## 2017-05-18 VITALS — BP 151/64 | HR 58 | Ht 67.5 in | Wt 176.2 lb

## 2017-05-18 DIAGNOSIS — I779 Disorder of arteries and arterioles, unspecified: Secondary | ICD-10-CM

## 2017-05-18 DIAGNOSIS — E785 Hyperlipidemia, unspecified: Secondary | ICD-10-CM

## 2017-05-18 DIAGNOSIS — I25118 Atherosclerotic heart disease of native coronary artery with other forms of angina pectoris: Secondary | ICD-10-CM | POA: Diagnosis not present

## 2017-05-18 DIAGNOSIS — I739 Peripheral vascular disease, unspecified: Secondary | ICD-10-CM | POA: Diagnosis not present

## 2017-05-18 MED ORDER — CILOSTAZOL 100 MG PO TABS: ORAL_TABLET | ORAL | 4 refills | Status: DC

## 2017-05-18 NOTE — Patient Instructions (Signed)
Medication Instructions: STOP the Plavix START the Cilostazol (Pletal) 50 mg (half a tablet) twice daily for two weeks. Then start 100 mg tablet twice daily.  If you need a refill on your cardiac medications before your next appointment, please call your pharmacy.   Procedures/Testing: Your physician has requested that you have a lower extremity arterial duplex. During this test, ultrasound is used to evaluate arterial blood flow in the legs. Allow one hour for this exam. There are no restrictions or special instructions. This will take place at [address], [address].  Your physician has requested that you have a lower extremity segmental doppler. This will take place at [address], [address].   Follow-Up: Your physician wants you to follow-up in one month after the tests with Dr. [name].    Thank you for choosing [facility] at [facility]!!

## 2017-05-18 NOTE — Progress Notes (Signed)
Cardiology Office Note   Date:  05/18/2017   ID:  Taylor [name], DOB 1945/06/21, MRN [id number]  PCP:  Lawerance Cruel, MD  Cardiologist:  Dr. Acie Fredrickson  No chief complaint on file.     History of Present Illness: Taylor [name] is a 72 y.o. female who presents for a follow-up visit regarding peripheral arterial disease. She has known history of CAD s/p CABG in 2006, pulmonary embolism in 2014, breast cancer status post left mastectomy in 2011, hyperlipidemia, type 2 diabetes and hypertension. She is not a smoker. She is followed by me for bilateral calf claudication. Lower extremity angiography in November 2016 showed bilateral popliteal artery occlusion. She was treated medically without revascularization. She presented with unstable angina in February 2017. She was found to have critical mid RCA stenosis which was treated by PCI and drug-eluting stent placement.  She was hospitalized for diverticulitis and had another hospital admission for allergic reaction to an antibiotic.  She was diagnosed with sleep apnea and is using CPAP.  She reports significant worsening of bilateral calf discomfort which is now happening with minimal activities.  No recent chest pain or shortness of breath.   Past Medical History:  Diagnosis Date  . Cancer of left breast ([place])   . Complication of anesthesia   . Coronary artery disease    a. 2006 s/p CABG x 4 (LIMA->LAD, VG->Diag->OM, VG->RPDA);  b. 02/2015: [place] s/p DES to [facility], VG->PDA & VG->D1->OM 100; c. 06/2015 Cath: diffuse dzs->Med rx; d. 11/2015 Cath/PCI: LM nl, LAD 100ost, 95d, D1 90ost, LCX 35ost, 65p, OM2 40, lat OM2 90, RCA 95p (2.75x16 Synergy DES), patent mid stent, RPLB 70, LIMA->LAD ok.EF55-65%.  . Depression   . Hx of tobacco use, presenting hazards to health    a. quit 2006.  [name] Hypercholesteremia   . Hypertension   . Hypertensive heart disease   . Left carotid bruit    a. 09/2014 Carotid U/S: 1-39% bilat ICA stenosis.  [name] PAD  (peripheral artery disease) ([place])    a. 11/2014 ABI: R: 0.63, L 0.59;  b. 11/2014 Periph Angio: bilat pop occlusions. L - short w/ reconstituion via collats in dist pop w/ 2 vessel runoff, R long w/ reconstitution in prox tib/peroneal arteries-->med Rx w/ pletal.  . Pulmonary embolism ([place]) a. 2014.  [name] Reflux esophagitis   . Type II diabetes mellitus ([place])     Past Surgical History:  Procedure Laterality Date  . BREAST BIOPSY Left ~ 2012  . BUNIONECTOMY Left 1970s  . CARDIAC CATHETERIZATION  04/2004  . CARDIAC CATHETERIZATION N/A 02/21/2015   Procedure: Left Heart Cath and Cors/Grafts Angiography;  Surgeon: Belva Crome, MD;  Location: [place] [facility];  Service: Cardiovascular;  Laterality: N/A;  . CARDIAC CATHETERIZATION N/A 02/21/2015   Procedure: Coronary Stent Intervention;  Surgeon: Belva Crome, MD;  Location: [place] [facility];  Service: Cardiovascular;  Laterality: N/A;  . CARDIAC CATHETERIZATION N/A 02/21/2015   Procedure: Intravascular Pressure Wire/FFR Study;  Surgeon: Belva Crome, MD;  Location: [place] [facility];  Service: Cardiovascular;  Laterality: N/A;  . CARDIAC CATHETERIZATION N/A 06/20/2015   Procedure: Left Heart Cath and Cors/Grafts Angiography;  Surgeon: Larey Dresser, MD;  Location: [place] [facility];  Service: Cardiovascular;  Laterality: N/A;  . CARDIAC CATHETERIZATION N/A 11/19/2015   Procedure: Left Heart Cath and Cors/Grafts Angiography;  Surgeon: Peter M Martinique, MD;  Location: [place] [facility];  Service: Cardiovascular;  Laterality: N/A;  .  CARDIAC CATHETERIZATION N/A 11/19/2015   Procedure: Coronary Stent Intervention;  Surgeon: Peter M Martinique, MD;  Location: [place] [facility];  Service: Cardiovascular;  Laterality: N/A;  . CATARACT EXTRACTION W/ INTRAOCULAR LENS  IMPLANT, BILATERAL Bilateral 1980s-1990s  . CORONARY ANGIOPLASTY WITH STENT PLACEMENT  02/21/2015   "1 stent"  . CORONARY ARTERY BYPASS GRAFT  05/2004   "CABG X4"  . EYE SURGERY    .  LAPAROSCOPIC CHOLECYSTECTOMY  1990s  . MASTECTOMY Left ~ 2012  . PERIPHERAL VASCULAR CATHETERIZATION N/A 12/12/2014   Procedure: Abdominal Aortogram;  Surgeon: Wellington Hampshire, MD;  Location: [place] [facility];  Service: Cardiovascular;  Laterality: N/A;  . RETINAL DETACHMENT SURGERY Right 1990s     Current Outpatient Medications  Medication Sig Dispense Refill  . acetaminophen (TYLENOL) 500 MG tablet Take 1,000 mg by mouth every 6 (six) hours as needed for headache (pain).     [name] ALPRAZolam (XANAX) 0.25 MG tablet Take 0.25 mg by mouth daily as needed for anxiety.     [name] aspirin 81 MG tablet Take 81 mg by mouth daily.    . brimonidine-timolol (COMBIGAN) 0.2-0.5 % ophthalmic solution Place 1 drop into both eyes every 12 (twelve) hours.    . clopidogrel (PLAVIX) 75 MG tablet Take 1 tablet (75 mg total) by mouth daily. 90 tablet 3  . isosorbide mononitrate (IMDUR) 60 MG 24 hr tablet TAKE ONE TABLET BY MOUTH AT BEDTIME. 90 tablet 2  . lisinopril (PRINIVIL,ZESTRIL) 10 MG tablet Take 1 tablet (10 mg total) by mouth daily. 90 tablet 3  . metFORMIN (GLUCOPHAGE) 500 MG tablet Take 1,000 mg by mouth 2 (two) times daily with a meal.     . nitroGLYCERIN (NITROSTAT) 0.4 MG SL tablet Place 1 tablet (0.4 mg total) under the tongue every 5 (five) minutes as needed for chest pain (MAX 3 doses in 15 minutes). 15 tablet 0  . rosuvastatin (CRESTOR) 20 MG tablet TAKE ONE TABLET BY MOUTH ONCE DAILY AT  6PM 90 tablet 3  . sertraline (ZOLOFT) 100 MG tablet Take 100 mg by mouth daily.      No current facility-administered medications for this visit.    Facility-Administered Medications Ordered in Other Visits  Medication Dose Route Frequency Provider Last Rate Last Dose  . aminophylline injection 75 mg  75 mg Intravenous BID PRN Larey Dresser, MD   75 mg at 08/08/14 1335    Allergies:   Niacin and Lipitor [atorvastatin]    Social History:  The patient  reports that she quit smoking about 13 years ago. Her  smoking use included cigarettes. She has a 7.50 pack-year smoking history. She has never used smokeless tobacco. She reports that she does not drink alcohol or use drugs.   Family History:  The patient's family history includes Cancer in her sister; Coronary artery disease (age of onset: 58) in her sister; Coronary artery disease (age of onset: 74) in her mother; Deep vein thrombosis in her cousin; Heart attack (age of onset: 5) in her father.    ROS:  Please see the history of present illness.   Otherwise, review of systems are positive for none.   All other systems are reviewed and negative.    PHYSICAL EXAM: VS:  BP (!) 151/64   Pulse (!) 58   Ht 5' 7.5" (1.715 m)   Wt 176 lb 3.2 oz (79.9 kg)   BMI 27.19 kg/m  , BMI Body mass index is 27.19 kg/m. GEN: Well nourished, well  developed, in no acute distress  HEENT: normal  Neck: no JVD, or masses.  Bilateral carotid bruits Cardiac: RRR; no murmurs, rubs, or gallops,no edema  Respiratory:  clear to auscultation bilaterally, normal work of breathing GI: soft, nontender, nondistended, + BS MS: no deformity or atrophy  Skin: warm and dry, no rash Neuro:  Strength and sensation are intact Psych: euthymic mood, full affect Vascular: Femoral pulses normal bilaterally.  Distal pulses are not palpable.   EKG:  EKG is not ordered today.    Recent Labs: 07/22/2016: TSH 1.850 01/09/2017: ALT 12 02/22/2017: BUN 21; Creatinine, Ser 1.33; Hemoglobin 13.3; Platelets 240; Potassium 4.2; Sodium 141    Lipid Panel    Component Value Date/Time   CHOL 86 (L) 01/27/2016 1034   TRIG 134 01/27/2016 1034   HDL 29 (L) 01/27/2016 1034   CHOLHDL 3.0 01/27/2016 1034   CHOLHDL 3.6 09/06/2015 0957   VLDL 20 09/06/2015 0957   LDLCALC 30 01/27/2016 1034      Wt Readings from Last 3 Encounters:  05/18/17 176 lb 3.2 oz (79.9 kg)  02/22/17 168 lb (76.2 kg)  01/09/17 175 lb (79.4 kg)       ASSESSMENT AND PLAN:  1.  Peripheral arterial disease:  She has known bilateral popliteal artery occlusion.  She now reports significant worsening of bilateral calf claudication.   I requested lower extremity arterial Doppler and duplex. I elected to resume this Pletal at 50 mg twice daily for 2 weeks then 100 mg twice daily after.  2. Coronary artery disease status post CABG with other forms of angina:   Her symptoms are overall stable and most recent stenting was more than 2 years ago.  I elected to discontinue Plavix given the addition of Pletal.  3. Right carotid stenosis: Most recent Doppler in September showed only mild disease.  Continue treatment of risk factors.  4. Hyperlipidemia: Most recent LDL was 30. Continue treatment with rosuvastatin.    Disposition:   FU with me in 1 months  Signed,  Kathlyn Sacramento, MD  05/18/2017 8:52 AM    [place]

## 2017-05-27 ENCOUNTER — Ambulatory Visit (HOSPITAL_COMMUNITY)
Admission: RE | Admit: 2017-05-27 | Discharge: 2017-05-27 | Disposition: A | Discharge status: Home / Self Care | Payer: Medicare Other | Source: Ambulatory Visit | Attending: Cardiology | Admitting: Cardiology

## 2017-05-27 DIAGNOSIS — I739 Peripheral vascular disease, unspecified: Secondary | ICD-10-CM | POA: Diagnosis not present

## 2017-06-05 DIAGNOSIS — S30861A Insect bite (nonvenomous) of abdominal wall, initial encounter: Secondary | ICD-10-CM | POA: Diagnosis not present

## 2017-06-22 ENCOUNTER — Ambulatory Visit (INDEPENDENT_AMBULATORY_CARE_PROVIDER_SITE_OTHER): Payer: Medicare Other | Admitting: Cardiovascular Disease

## 2017-06-22 VITALS — BP 149/85 | HR 72 | Ht 67.0 in | Wt 175.6 lb

## 2017-06-22 DIAGNOSIS — E785 Hyperlipidemia, unspecified: Secondary | ICD-10-CM

## 2017-06-22 DIAGNOSIS — I779 Disorder of arteries and arterioles, unspecified: Secondary | ICD-10-CM | POA: Diagnosis not present

## 2017-06-22 DIAGNOSIS — I25118 Atherosclerotic heart disease of native coronary artery with other forms of angina pectoris: Secondary | ICD-10-CM

## 2017-06-22 DIAGNOSIS — I739 Peripheral vascular disease, unspecified: Secondary | ICD-10-CM | POA: Diagnosis not present

## 2017-06-22 MED ORDER — CLOPIDOGREL BISULFATE 75 MG PO TABS: 75.0000 mg | ORAL_TABLET | Freq: Every day | ORAL | 3 refills | Status: DC

## 2017-06-22 NOTE — Progress Notes (Signed)
Cardiology Office Note   Date:  06/22/2017   ID:  Taylor [name], DOB [birth year], MRN [id number]  PCP:  Lawerance Cruel, MD  Cardiologist:  Dr. Acie Fredrickson  No chief complaint on file.     History of Present Illness: Taylor [name] is a 72 y.o. female who presents for a follow-up visit regarding peripheral arterial disease. She has known history of CAD s/p CABG in 2006, pulmonary embolism in 2014, breast cancer status post left mastectomy in 2011, hyperlipidemia, type 2 diabetes and hypertension. She is not a smoker. She is followed by me for bilateral calf claudication. Lower extremity angiography in November 2016 showed bilateral popliteal artery occlusion. She was treated medically without revascularization. She presented with unstable angina in February 2017. She was found to have critical mid RCA stenosis which was treated by PCI and drug-eluting stent placement.   She was seen recently for worsening bilateral calf claudication.  I repeated ABI which was stable and if anything was actually better than before.  She now reports that she is mostly bothered by knee arthritis more than calf discomfort. The plan was to start her back on cilostazol and stop Plavix.  However, she remembered that she had no significant improvement on cilostazol and thus she did not make the change.  Past Medical History:  Diagnosis Date  . Cancer of left breast ([place])   . Complication of anesthesia   . Coronary artery disease    a. 2006 s/p CABG x 4 (LIMA->LAD, VG->Diag->OM, VG->RPDA);  b. 02/2015: [place] s/p DES to [facility], VG->PDA & VG->D1->OM 100; c. 06/2015 Cath: diffuse dzs->Med rx; d. 11/2015 Cath/PCI: LM nl, LAD 100ost, 95d, D1 90ost, LCX 35ost, 65p, OM2 40, lat OM2 90, RCA 95p (2.75x16 Synergy DES), patent mid stent, RPLB 70, LIMA->LAD ok.EF55-65%.  . Depression   . Hx of tobacco use, presenting hazards to health    a. quit 2006.  [name] Hypercholesteremia   . Hypertension   . Hypertensive heart disease     . Left carotid bruit    a. 09/2014 Carotid U/S: 1-39% bilat ICA stenosis.  [name] PAD (peripheral artery disease) ([place])    a. 11/2014 ABI: R: 0.63, L 0.59;  b. 11/2014 Periph Angio: bilat pop occlusions. L - short w/ reconstituion via collats in dist pop w/ 2 vessel runoff, R long w/ reconstitution in prox tib/peroneal arteries-->med Rx w/ pletal.  . Pulmonary embolism ([place]) a. 2014.  [name] Reflux esophagitis   . Type II diabetes mellitus ([place])     Past Surgical History:  Procedure Laterality Date  . BREAST BIOPSY Left ~ 2012  . BUNIONECTOMY Left 1970s  . CARDIAC CATHETERIZATION  04/2004  . CARDIAC CATHETERIZATION N/A 02/21/2015   Procedure: Left Heart Cath and Cors/Grafts Angiography;  Surgeon: Belva Crome, MD;  Location: [place] [facility];  Service: Cardiovascular;  Laterality: N/A;  . CARDIAC CATHETERIZATION N/A 02/21/2015   Procedure: Coronary Stent Intervention;  Surgeon: Belva Crome, MD;  Location: [place] [facility];  Service: Cardiovascular;  Laterality: N/A;  . CARDIAC CATHETERIZATION N/A 02/21/2015   Procedure: Intravascular Pressure Wire/FFR Study;  Surgeon: Belva Crome, MD;  Location: [place] [facility];  Service: Cardiovascular;  Laterality: N/A;  . CARDIAC CATHETERIZATION N/A 06/20/2015   Procedure: Left Heart Cath and Cors/Grafts Angiography;  Surgeon: Larey Dresser, MD;  Location: [place] [facility];  Service: Cardiovascular;  Laterality: N/A;  . CARDIAC CATHETERIZATION N/A 11/19/2015   Procedure: Left Heart Cath and  Cors/Grafts Angiography;  Surgeon: Peter M Martinique, MD;  Location: [place] [facility];  Service: Cardiovascular;  Laterality: N/A;  . CARDIAC CATHETERIZATION N/A 11/19/2015   Procedure: Coronary Stent Intervention;  Surgeon: Peter M Martinique, MD;  Location: [place] [facility];  Service: Cardiovascular;  Laterality: N/A;  . CATARACT EXTRACTION W/ INTRAOCULAR LENS  IMPLANT, BILATERAL Bilateral 1980s-1990s  . CORONARY ANGIOPLASTY WITH STENT PLACEMENT  02/21/2015   "1 stent"  .  CORONARY ARTERY BYPASS GRAFT  05/2004   "CABG X4"  . EYE SURGERY    . LAPAROSCOPIC CHOLECYSTECTOMY  1990s  . MASTECTOMY Left ~ 2012  . PERIPHERAL VASCULAR CATHETERIZATION N/A 12/12/2014   Procedure: Abdominal Aortogram;  Surgeon: Wellington Hampshire, MD;  Location: [place] [facility];  Service: Cardiovascular;  Laterality: N/A;  . RETINAL DETACHMENT SURGERY Right 1990s     Current Outpatient Medications  Medication Sig Dispense Refill  . acetaminophen (TYLENOL) 500 MG tablet Take 1,000 mg by mouth every 6 (six) hours as needed for headache (pain).     [name] ALPRAZolam (XANAX) 0.25 MG tablet Take 0.25 mg by mouth daily as needed for anxiety.     [name] aspirin 81 MG tablet Take 81 mg by mouth daily.    . brimonidine-timolol (COMBIGAN) 0.2-0.5 % ophthalmic solution Place 1 drop into both eyes every 12 (twelve) hours.    . cilostazol (PLETAL) 100 MG tablet Take 50 mg (half a tablet) twice daily for two weeks then increase to 100 mg twice daily (Patient not taking: Reported on 06/22/2017) 30 tablet 4  . isosorbide mononitrate (IMDUR) 60 MG 24 hr tablet TAKE ONE TABLET BY MOUTH AT BEDTIME. 90 tablet 2  . lisinopril (PRINIVIL,ZESTRIL) 10 MG tablet Take 1 tablet (10 mg total) by mouth daily. 90 tablet 3  . metFORMIN (GLUCOPHAGE) 500 MG tablet Take 1,000 mg by mouth 2 (two) times daily with a meal.     . nitroGLYCERIN (NITROSTAT) 0.4 MG SL tablet Place 1 tablet (0.4 mg total) under the tongue every 5 (five) minutes as needed for chest pain (MAX 3 doses in 15 minutes). 15 tablet 0  . rosuvastatin (CRESTOR) 20 MG tablet TAKE ONE TABLET BY MOUTH ONCE DAILY AT  6PM 90 tablet 3  . sertraline (ZOLOFT) 100 MG tablet Take 100 mg by mouth daily.      No current facility-administered medications for this visit.    Facility-Administered Medications Ordered in Other Visits  Medication Dose Route Frequency Provider Last Rate Last Dose  . aminophylline injection 75 mg  75 mg Intravenous BID PRN Larey Dresser, MD   75 mg  at 08/08/14 1335    Allergies:   Niacin and Lipitor [atorvastatin]    Social History:  The patient  reports that she quit smoking about 13 years ago. Her smoking use included cigarettes. She has a 7.50 pack-year smoking history. She has never used smokeless tobacco. She reports that she does not drink alcohol or use drugs.   Family History:  The patient's family history includes Cancer in her sister; Coronary artery disease (age of onset: 29) in her sister; Coronary artery disease (age of onset: 29) in her mother; Deep vein thrombosis in her cousin; Heart attack (age of onset: 53) in her father.    ROS:  Please see the history of present illness.   Otherwise, review of systems are positive for none.   All other systems are reviewed and negative.    PHYSICAL EXAM: VS:  BP (!) 149/85 (BP Location:  Right Arm)   Pulse 72   Ht 5\' 7"  (1.702 m)   Wt 175 lb 9.6 oz (79.7 kg)   BMI 27.50 kg/m  , BMI Body mass index is 27.5 kg/m. GEN: Well nourished, well developed, in no acute distress  HEENT: normal  Neck: no JVD, or masses.  Bilateral carotid bruits Cardiac: RRR; no murmurs, rubs, or gallops,no edema  Respiratory:  clear to auscultation bilaterally, normal work of breathing GI: soft, nontender, nondistended, + BS MS: no deformity or atrophy  Skin: warm and dry, no rash Neuro:  Strength and sensation are intact Psych: euthymic mood, full affect Vascular: Femoral pulses normal bilaterally.  Distal pulses are not palpable.   EKG:  EKG is not ordered today.    Recent Labs: 07/22/2016: TSH 1.850 01/09/2017: ALT 12 02/22/2017: BUN 21; Creatinine, Ser 1.33; Hemoglobin 13.3; Platelets 240; Potassium 4.2; Sodium 141    Lipid Panel    Component Value Date/Time   CHOL 86 (L) 01/27/2016 1034   TRIG 134 01/27/2016 1034   HDL 29 (L) 01/27/2016 1034   CHOLHDL 3.0 01/27/2016 1034   CHOLHDL 3.6 09/06/2015 0957   VLDL 20 09/06/2015 0957   LDLCALC 30 01/27/2016 1034      Wt Readings from  Last 3 Encounters:  06/22/17 175 lb 9.6 oz (79.7 kg)  05/18/17 176 lb 3.2 oz (79.9 kg)  02/22/17 168 lb (76.2 kg)       ASSESSMENT AND PLAN:  1.  Peripheral arterial disease: She has known bilateral popliteal artery occlusion.  recent ABI was stable bilaterally and continues to be in the mild to moderate range. Recent worsening of pain seems to be due to arthritis in her knees. I recommend continuing medical therapy. She reports no improvement in the past on cilostazol and thus we will keep her on aspirin and Plavix for now.  2. Coronary artery disease status post CABG with other forms of angina:    No significant change in symptoms.  3. Right carotid stenosis: Most recent Doppler in September showed only mild disease.  Continue treatment of risk factors.  4. Hyperlipidemia: Most recent LDL was 30. Continue treatment with rosuvastatin.    Disposition:   FU with me in 1 year  Signed,  Kathlyn Sacramento, MD  06/22/2017 10:54 AM    [place]

## 2017-06-22 NOTE — Patient Instructions (Signed)
Medication Instructions: RESTART the Plavix 75 mg STOP the Pletal  If you need a refill on your cardiac medications before your next appointment, please call your pharmacy.    Follow-Up: Your physician wants you to follow-up in 12 months with Dr. [name]. You will receive a reminder letter in the mail two months in advance. If you don't receive a letter, please call our office at [phone] to schedule this follow-up appointment.   Thank you for choosing Heartcare at [facility]!!

## 2017-06-24 DIAGNOSIS — H10413 Chronic giant papillary conjunctivitis, bilateral: Secondary | ICD-10-CM | POA: Diagnosis not present

## 2017-06-24 DIAGNOSIS — H33053 Total retinal detachment, bilateral: Secondary | ICD-10-CM | POA: Diagnosis not present

## 2017-06-24 DIAGNOSIS — H40013 Open angle with borderline findings, low risk, bilateral: Secondary | ICD-10-CM | POA: Diagnosis not present

## 2017-06-24 DIAGNOSIS — Z961 Presence of intraocular lens: Secondary | ICD-10-CM | POA: Diagnosis not present

## 2017-06-24 DIAGNOSIS — H04123 Dry eye syndrome of bilateral lacrimal glands: Secondary | ICD-10-CM | POA: Diagnosis not present

## 2017-07-31 ENCOUNTER — Other Ambulatory Visit: Payer: Self-pay | Admitting: Cardiovascular Disease

## 2017-07-31 DIAGNOSIS — I10 Essential (primary) hypertension: Secondary | ICD-10-CM

## 2017-07-31 DIAGNOSIS — E7800 Pure hypercholesterolemia, unspecified: Secondary | ICD-10-CM

## 2017-07-31 DIAGNOSIS — I2583 Coronary atherosclerosis due to lipid rich plaque: Secondary | ICD-10-CM

## 2017-07-31 DIAGNOSIS — I6523 Occlusion and stenosis of bilateral carotid arteries: Secondary | ICD-10-CM

## 2017-07-31 DIAGNOSIS — I2510 Atherosclerotic heart disease of native coronary artery without angina pectoris: Secondary | ICD-10-CM

## 2017-09-28 DIAGNOSIS — D72829 Elevated white blood cell count, unspecified: Secondary | ICD-10-CM | POA: Diagnosis not present

## 2017-09-28 DIAGNOSIS — I10 Essential (primary) hypertension: Secondary | ICD-10-CM | POA: Diagnosis not present

## 2017-09-28 DIAGNOSIS — E1142 Type 2 diabetes mellitus with diabetic polyneuropathy: Secondary | ICD-10-CM | POA: Diagnosis not present

## 2017-09-28 DIAGNOSIS — E7800 Pure hypercholesterolemia, unspecified: Secondary | ICD-10-CM | POA: Diagnosis not present

## 2017-09-29 ENCOUNTER — Other Ambulatory Visit: Payer: Self-pay | Admitting: Physician Assistant

## 2017-10-01 DIAGNOSIS — E7800 Pure hypercholesterolemia, unspecified: Secondary | ICD-10-CM | POA: Diagnosis not present

## 2017-10-01 DIAGNOSIS — E1142 Type 2 diabetes mellitus with diabetic polyneuropathy: Secondary | ICD-10-CM | POA: Diagnosis not present

## 2017-10-01 DIAGNOSIS — I10 Essential (primary) hypertension: Secondary | ICD-10-CM | POA: Diagnosis not present

## 2017-10-01 DIAGNOSIS — F419 Anxiety disorder, unspecified: Secondary | ICD-10-CM | POA: Diagnosis not present

## 2017-10-01 DIAGNOSIS — Z1389 Encounter for screening for other disorder: Secondary | ICD-10-CM | POA: Diagnosis not present

## 2017-10-01 DIAGNOSIS — Z0000 Encounter for general adult medical examination without abnormal findings: Secondary | ICD-10-CM | POA: Diagnosis not present

## 2017-10-01 DIAGNOSIS — Z7984 Long term (current) use of oral hypoglycemic drugs: Secondary | ICD-10-CM | POA: Diagnosis not present

## 2017-10-08 DIAGNOSIS — E1142 Type 2 diabetes mellitus with diabetic polyneuropathy: Secondary | ICD-10-CM | POA: Diagnosis not present

## 2017-10-27 DIAGNOSIS — D225 Melanocytic nevi of trunk: Secondary | ICD-10-CM | POA: Diagnosis not present

## 2017-10-27 DIAGNOSIS — C44622 Squamous cell carcinoma of skin of right upper limb, including shoulder: Secondary | ICD-10-CM | POA: Diagnosis not present

## 2017-10-27 DIAGNOSIS — L821 Other seborrheic keratosis: Secondary | ICD-10-CM | POA: Diagnosis not present

## 2017-10-29 ENCOUNTER — Other Ambulatory Visit: Payer: Self-pay | Admitting: Cardiovascular Disease

## 2017-11-04 DIAGNOSIS — Z23 Encounter for immunization: Secondary | ICD-10-CM | POA: Diagnosis not present

## 2017-11-05 ENCOUNTER — Other Ambulatory Visit: Payer: Self-pay | Admitting: Family Medicine

## 2017-11-05 DIAGNOSIS — Z1231 Encounter for screening mammogram for malignant neoplasm of breast: Secondary | ICD-10-CM

## 2017-11-19 ENCOUNTER — Ambulatory Visit (INDEPENDENT_AMBULATORY_CARE_PROVIDER_SITE_OTHER): Payer: Medicare Other | Admitting: Cardiovascular Disease

## 2017-11-19 ENCOUNTER — Encounter: Payer: Self-pay | Admitting: Cardiovascular Disease

## 2017-11-19 ENCOUNTER — Encounter (INDEPENDENT_AMBULATORY_CARE_PROVIDER_SITE_OTHER): Payer: Self-pay

## 2017-11-19 DIAGNOSIS — I2510 Atherosclerotic heart disease of native coronary artery without angina pectoris: Secondary | ICD-10-CM | POA: Diagnosis not present

## 2017-11-19 DIAGNOSIS — E7800 Pure hypercholesterolemia, unspecified: Secondary | ICD-10-CM | POA: Diagnosis not present

## 2017-11-19 DIAGNOSIS — I6523 Occlusion and stenosis of bilateral carotid arteries: Secondary | ICD-10-CM

## 2017-11-19 DIAGNOSIS — I2583 Coronary atherosclerosis due to lipid rich plaque: Secondary | ICD-10-CM

## 2017-11-19 DIAGNOSIS — I10 Essential (primary) hypertension: Secondary | ICD-10-CM

## 2017-11-19 MED ORDER — LISINOPRIL 10 MG PO TABS: 10.0000 mg | ORAL_TABLET | Freq: Every day | ORAL | 3 refills | Status: DC

## 2017-11-19 MED ORDER — ISOSORBIDE MONONITRATE ER 60 MG PO TB24: 60.0000 mg | ORAL_TABLET | Freq: Every day | ORAL | 3 refills | Status: DC

## 2017-11-19 NOTE — Patient Instructions (Signed)
Medication Instructions:  Your physician recommends that you continue on your current medications as directed. Please refer to the Current Medication list given to you today.  If you need a refill on your cardiac medications before your next appointment, please call your pharmacy.   Lab work: Your physician recommends that you return for a FASTING lipid profile: in 1 year  If you have labs (blood work) drawn today and your tests are completely normal, you will receive your results only by: [name] MyChart Message (if you have MyChart) OR . A paper copy in the mail If you have any lab test that is abnormal or we need to change your treatment, we will call you to review the results.  Testing/Procedures: None ordered  Follow-Up: At [facility], you and your health needs are our priority.  As part of our continuing mission to provide you with exceptional heart care, we have created designated Provider Care Teams.  These Care Teams include your primary Cardiologist (physician) and Advanced Practice Providers (APPs -  Physician Assistants and Nurse Practitioners) who all work together to provide you with the care you need, when you need it. You will need a follow up appointment in:  12 months.  Please call our office 2 months in advance to schedule this appointment.  You may see Mertie Moores, MD or one of the following Advanced Practice Providers on your designated Care Team: Richardson Dopp, PA-C [place], [place] . Daune Perch, NP  Any Other Special Instructions Will Be Listed Below (If Applicable).

## 2017-11-19 NOTE — Progress Notes (Signed)
Cardiology Office Note:    Date:  11/19/2017   ID:  Taylor [name], DOB Oct 18, 1945, MRN [id number]  PCP:  Lawerance Cruel, MD  Cardiologist:  Mertie Moores, MD    Referring MD: Lawerance Cruel, MD   Problem list 1. Coronary artery disease-status post coronary artery bypass grafting 2006 2. Peripheral vascular disease 3. History pulmonary embolus 4. History of breast cancer 5. Essential hypertension 6. Hyperlipidemia 7. Carotid artery disease 8. Diabetes  mellitus   Chief Complaint  Patient presents with  . Coronary Artery Disease        Taylor [name] is a 72 y.o. female with a hx of Coronary artery disease, hypertension, hyperlipidemia and peripheral vascular disease. She denies having any episodes of chest pain or shortness of breath. She complains of having lots of leg aches and pains. Symptoms do not sound like claudication. She has known occlusion of her popliteal arteries.  Nov. 8, 2019: Taylor [name] has a history of coronary artery disease and coronary artery bypass grafting in 2008.  She also has a history of hypertension, hyperlipidemia and peripheral vascular disease.  Is having DOE  Walking is limited by her claudication  Sees Dr. [name] for her PVC  On plavix after a stento placement in Nov. 2017   Past Medical History:  Diagnosis Date  . Cancer of left breast ([place])   . Complication of anesthesia   . Coronary artery disease    a. 2006 s/p CABG x 4 (LIMA->LAD, VG->Diag->OM, VG->RPDA);  b. 02/2015: [place] s/p DES to [facility], VG->PDA & VG->D1->OM 100; c. 06/2015 Cath: diffuse dzs->Med rx; d. 11/2015 Cath/PCI: LM nl, LAD 100ost, 95d, D1 90ost, LCX 35ost, 65p, OM2 40, lat OM2 90, RCA 95p (2.75x16 Synergy DES), patent mid stent, RPLB 70, LIMA->LAD ok.EF55-65%.  . Depression   . Hx of tobacco use, presenting hazards to health    a. quit 2006.  [name] Hypercholesteremia   . Hypertension   . Hypertensive heart disease   . Left carotid bruit    a. 09/2014 Carotid U/S: 1-39%  bilat ICA stenosis.  [name] PAD (peripheral artery disease) ([place])    a. 11/2014 ABI: R: 0.63, L 0.59;  b. 11/2014 Periph Angio: bilat pop occlusions. L - short w/ reconstituion via collats in dist pop w/ 2 vessel runoff, R long w/ reconstitution in prox tib/peroneal arteries-->med Rx w/ pletal.  . Pulmonary embolism ([place]) a. 2014.  [name] Reflux esophagitis   . Type II diabetes mellitus ([place])     Past Surgical History:  Procedure Laterality Date  . BREAST BIOPSY Left ~ 2012  . BUNIONECTOMY Left 1970s  . CARDIAC CATHETERIZATION  04/2004  . CARDIAC CATHETERIZATION N/A 02/21/2015   Procedure: Left Heart Cath and Cors/Grafts Angiography;  Surgeon: Belva Crome, MD;  Location: [place] [facility];  Service: Cardiovascular;  Laterality: N/A;  . CARDIAC CATHETERIZATION N/A 02/21/2015   Procedure: Coronary Stent Intervention;  Surgeon: Belva Crome, MD;  Location: [place] [facility];  Service: Cardiovascular;  Laterality: N/A;  . CARDIAC CATHETERIZATION N/A 02/21/2015   Procedure: Intravascular Pressure Wire/FFR Study;  Surgeon: Belva Crome, MD;  Location: [place] [facility];  Service: Cardiovascular;  Laterality: N/A;  . CARDIAC CATHETERIZATION N/A 06/20/2015   Procedure: Left Heart Cath and Cors/Grafts Angiography;  Surgeon: Larey Dresser, MD;  Location: [place] [facility];  Service: Cardiovascular;  Laterality: N/A;  . CARDIAC CATHETERIZATION N/A 11/19/2015   Procedure: Left Heart Cath and Cors/Grafts Angiography;  Surgeon: Collier Salina  M Martinique, MD;  Location: [place] [facility];  Service: Cardiovascular;  Laterality: N/A;  . CARDIAC CATHETERIZATION N/A 11/19/2015   Procedure: Coronary Stent Intervention;  Surgeon: Peter M Martinique, MD;  Location: [place] [facility];  Service: Cardiovascular;  Laterality: N/A;  . CATARACT EXTRACTION W/ INTRAOCULAR LENS  IMPLANT, BILATERAL Bilateral 1980s-1990s  . CORONARY ANGIOPLASTY WITH STENT PLACEMENT  02/21/2015   "1 stent"  . CORONARY ARTERY BYPASS GRAFT  05/2004   "CABG X4"  .  EYE SURGERY    . LAPAROSCOPIC CHOLECYSTECTOMY  1990s  . MASTECTOMY Left ~ 2012  . PERIPHERAL VASCULAR CATHETERIZATION N/A 12/12/2014   Procedure: Abdominal Aortogram;  Surgeon: Wellington Hampshire, MD;  Location: [place] [facility];  Service: Cardiovascular;  Laterality: N/A;  . RETINAL DETACHMENT SURGERY Right 1990s    Current Medications: Current Meds  Medication Sig  . acetaminophen (TYLENOL) 500 MG tablet Take 1,000 mg by mouth every 6 (six) hours as needed for headache (pain).   [name] ALPRAZolam (XANAX) 0.25 MG tablet Take 0.25 mg by mouth daily as needed for anxiety.   [name] aspirin 81 MG tablet Take 81 mg by mouth daily.  . brimonidine-timolol (COMBIGAN) 0.2-0.5 % ophthalmic solution Place 1 drop into both eyes every 12 (twelve) hours.  . clopidogrel (PLAVIX) 75 MG tablet TAKE 1 TABLET BY MOUTH DAILY  . isosorbide mononitrate (IMDUR) 60 MG 24 hr tablet Take 1 tablet (60 mg total) by mouth daily.  [name] lisinopril (PRINIVIL,ZESTRIL) 10 MG tablet Take 1 tablet (10 mg total) by mouth daily.  . metFORMIN (GLUCOPHAGE) 500 MG tablet Take 1,000 mg by mouth 2 (two) times daily with a meal.   . nitroGLYCERIN (NITROSTAT) 0.4 MG SL tablet Place 0.4 mg under the tongue every 5 (five) minutes as needed for chest pain.  . rosuvastatin (CRESTOR) 20 MG tablet TAKE ONE TABLET BY MOUTH ONCE DAILY AT  6PM  . sertraline (ZOLOFT) 100 MG tablet Take 100 mg by mouth daily.   . [DISCONTINUED] isosorbide mononitrate (IMDUR) 60 MG 24 hr tablet Take 60 mg by mouth daily.  . [DISCONTINUED] lisinopril (PRINIVIL,ZESTRIL) 10 MG tablet TAKE 1 TABLET BY MOUTH ONCE DAILY     Allergies:   Niacin and Lipitor [atorvastatin]   Social History   Socioeconomic History  . Marital status: Married    Spouse name: Not on file  . Number of children: Not on file  . Years of education: Not on file  . Highest education level: Not on file  Occupational History  . Not on file  Social Needs  . Financial resource strain: Not on file  .  Food insecurity:    Worry: Not on file    Inability: Not on file  . Transportation needs:    Medical: Not on file    Non-medical: Not on file  Tobacco Use  . Smoking status: Former Smoker    Packs/day: 0.50    Years: 15.00    Pack years: 7.50    Types: Cigarettes    Last attempt to quit: 05/11/2004    Years since quitting: 13.5  . Smokeless tobacco: Never Used  Substance and Sexual Activity  . Alcohol use: No  . Drug use: No  . Sexual activity: Not Currently  Lifestyle  . Physical activity:    Days per week: Not on file    Minutes per session: Not on file  . Stress: Not on file  Relationships  . Social connections:    Talks on phone: Not on  file    Gets together: Not on file    Attends religious service: Not on file    Active member of club or organization: Not on file    Attends meetings of clubs or organizations: Not on file    Relationship status: Not on file  Other Topics Concern  . Not on file  Social History Narrative   Lives in [place] with her husband.     Family History: The patient's family history includes Cancer in her sister; Coronary artery disease (age of onset: 41) in her sister; Coronary artery disease (age of onset: 37) in her mother; Deep vein thrombosis in her cousin; Heart attack (age of onset: 19) in her father. ROS:   Please see the history of present illness.     All other systems reviewed and are negative.  EKGs/Labs/Other Studies Reviewed:    The following studies were reviewed today:   EKG:   .     Recent Labs: 01/09/2017: ALT 12 02/22/2017: BUN 21; Creatinine, Ser 1.33; Hemoglobin 13.3; Platelets 240; Potassium 4.2; Sodium 141  Recent Lipid Panel    Component Value Date/Time   CHOL 86 (L) 01/27/2016 1034   TRIG 134 01/27/2016 1034   HDL 29 (L) 01/27/2016 1034   CHOLHDL 3.0 01/27/2016 1034   CHOLHDL 3.6 09/06/2015 0957   VLDL 20 09/06/2015 0957   LDLCALC 30 01/27/2016 1034    Physical Exam: Blood pressure (!) 124/56,  pulse 62, weight 172 lb (78 kg).  GEN:  Well nourished, well developed in no acute distress HEENT: Normal NECK: No JVD; No carotid bruits LYMPHATICS: No lymphadenopathy CARDIAC: RRR, no murmurs, rubs, gallops RESPIRATORY:  Clear to auscultation without rales, wheezing or rhonchi  ABDOMEN: Soft, non-tender, non-distended MUSCULOSKELETAL:  No edema; No deformity  SKIN: Warm and dry NEUROLOGIC:  Alert and oriented x 3   ASSESSMENT:    1. Essential hypertension   2. Coronary artery disease due to lipid rich plaque   3. Hypercholesteremia   4. Carotid stenosis, bilateral    PLAN:    In order of problems listed above:  1. 1. Coronary artery disease: . Taylor [name] is not having any episodes of chest discomfort.  She does have some shortness of breath with exertion.  She is limited in how much she can walk but her claudication symptoms.  Urged her to walk as much as possible.  2. HL -checked by her primary doctor.  Recent labs look fine.   3. HTN -  is well controlled.  4. Carotid artery disease -    5. PAD -followed by Dr. [name] .      Medication Adjustments/Labs and Tests Ordered: Current medicines are reviewed at length with the patient today.  Concerns regarding medicines are outlined above.  Orders Placed This Encounter  Procedures  . Lipid panel   Meds ordered this encounter  Medications  . isosorbide mononitrate (IMDUR) 60 MG 24 hr tablet    Sig: Take 1 tablet (60 mg total) by mouth daily.    Dispense:  90 tablet    Refill:  3  . lisinopril (PRINIVIL,ZESTRIL) 10 MG tablet    Sig: Take 1 tablet (10 mg total) by mouth daily.    Dispense:  90 tablet    Refill:  3    Signed, Mertie Moores, MD  11/19/2017 9:26 AM     [facility]

## 2017-11-29 DIAGNOSIS — Z08 Encounter for follow-up examination after completed treatment for malignant neoplasm: Secondary | ICD-10-CM | POA: Diagnosis not present

## 2017-11-29 DIAGNOSIS — Z85828 Personal history of other malignant neoplasm of skin: Secondary | ICD-10-CM | POA: Diagnosis not present

## 2017-12-20 ENCOUNTER — Ambulatory Visit
Admission: RE | Admit: 2017-12-20 | Discharge: 2017-12-20 | Disposition: A | Discharge status: Home / Self Care | Payer: Medicare Other | Source: Ambulatory Visit | Attending: Family Medicine | Admitting: Family Medicine

## 2017-12-20 DIAGNOSIS — Z1231 Encounter for screening mammogram for malignant neoplasm of breast: Secondary | ICD-10-CM

## 2017-12-30 DIAGNOSIS — Z961 Presence of intraocular lens: Secondary | ICD-10-CM | POA: Diagnosis not present

## 2017-12-30 DIAGNOSIS — H10413 Chronic giant papillary conjunctivitis, bilateral: Secondary | ICD-10-CM | POA: Diagnosis not present

## 2017-12-30 DIAGNOSIS — H40013 Open angle with borderline findings, low risk, bilateral: Secondary | ICD-10-CM | POA: Diagnosis not present

## 2017-12-30 DIAGNOSIS — H33053 Total retinal detachment, bilateral: Secondary | ICD-10-CM | POA: Diagnosis not present

## 2017-12-30 DIAGNOSIS — H04123 Dry eye syndrome of bilateral lacrimal glands: Secondary | ICD-10-CM | POA: Diagnosis not present

## 2018-01-16 DIAGNOSIS — E669 Obesity, unspecified: Secondary | ICD-10-CM | POA: Insufficient documentation

## 2018-01-16 NOTE — Progress Notes (Signed)
Cardiology Office Note:    Date:  01/17/2018   ID:  Taylor [name], DOB 14-Jul-1945, MRN [id number]  PCP:  Lawerance Cruel, MD  Cardiologist:  No primary care provider on file.    Referring MD: Lawerance Cruel, MD   Chief Complaint  Patient presents with  . Sleep Apnea    History of Present Illness:    Taylor [name] is a 73 y.o. female with a hx of moderate OSA with an AHI of 16.3/hr, loud snoring and oxygen desaturations as low as 83%. She underwent CPAP titration but could not be adequately titrated on CPAP and is now on auto BIPAP.  She is doing well with her PAP device.  She tolerates the mask and feels the pressure is adequate.  Since going on PAP she feels rested in the am and has no significant daytime sleepiness.  she denies any significant mouth or nasal dryness or nasal congestion.  She does not think that he snores.     Past Medical History:  Diagnosis Date  . Cancer of left breast ([place])   . Complication of anesthesia   . Coronary artery disease    a. 2006 s/p CABG x 4 (LIMA->LAD, VG->Diag->OM, VG->RPDA);  b. 02/2015: [place] s/p DES to [facility], VG->PDA & VG->D1->OM 100; c. 06/2015 Cath: diffuse dzs->Med rx; d. 11/2015 Cath/PCI: LM nl, LAD 100ost, 95d, D1 90ost, LCX 35ost, 65p, OM2 40, lat OM2 90, RCA 95p (2.75x16 Synergy DES), patent mid stent, RPLB 70, LIMA->LAD ok.EF55-65%.  . Depression   . Hx of tobacco use, presenting hazards to health    a. quit 2006.  [name] Hypercholesteremia   . Hypertension   . Hypertensive heart disease   . Left carotid bruit    a. 09/2014 Carotid U/S: 1-39% bilat ICA stenosis.  [name] PAD (peripheral artery disease) ([place])    a. 11/2014 ABI: R: 0.63, L 0.59;  b. 11/2014 Periph Angio: bilat pop occlusions. L - short w/ reconstituion via collats in dist pop w/ 2 vessel runoff, R long w/ reconstitution in prox tib/peroneal arteries-->med Rx w/ pletal.  . Pulmonary embolism ([place]) a. 2014.  [name] Reflux esophagitis   . Type II diabetes mellitus ([place])      Past Surgical History:  Procedure Laterality Date  . BREAST BIOPSY Left ~ 2012  . BUNIONECTOMY Left 1970s  . CARDIAC CATHETERIZATION  04/2004  . CARDIAC CATHETERIZATION N/A 02/21/2015   Procedure: Left Heart Cath and Cors/Grafts Angiography;  Surgeon: Belva Crome, MD;  Location: [place] [facility];  Service: Cardiovascular;  Laterality: N/A;  . CARDIAC CATHETERIZATION N/A 02/21/2015   Procedure: Coronary Stent Intervention;  Surgeon: Belva Crome, MD;  Location: [place] [facility];  Service: Cardiovascular;  Laterality: N/A;  . CARDIAC CATHETERIZATION N/A 02/21/2015   Procedure: Intravascular Pressure Wire/FFR Study;  Surgeon: Belva Crome, MD;  Location: [place] [facility];  Service: Cardiovascular;  Laterality: N/A;  . CARDIAC CATHETERIZATION N/A 06/20/2015   Procedure: Left Heart Cath and Cors/Grafts Angiography;  Surgeon: Larey Dresser, MD;  Location: [place] [facility];  Service: Cardiovascular;  Laterality: N/A;  . CARDIAC CATHETERIZATION N/A 11/19/2015   Procedure: Left Heart Cath and Cors/Grafts Angiography;  Surgeon: Peter M Martinique, MD;  Location: [place] [facility];  Service: Cardiovascular;  Laterality: N/A;  . CARDIAC CATHETERIZATION N/A 11/19/2015   Procedure: Coronary Stent Intervention;  Surgeon: Peter M Martinique, MD;  Location: [place] [facility];  Service: Cardiovascular;  Laterality: N/A;  .  CATARACT EXTRACTION W/ INTRAOCULAR LENS  IMPLANT, BILATERAL Bilateral 1980s-1990s  . CORONARY ANGIOPLASTY WITH STENT PLACEMENT  02/21/2015   "1 stent"  . CORONARY ARTERY BYPASS GRAFT  05/2004   "CABG X4"  . EYE SURGERY    . LAPAROSCOPIC CHOLECYSTECTOMY  1990s  . MASTECTOMY Left ~ 2012  . PERIPHERAL VASCULAR CATHETERIZATION N/A 12/12/2014   Procedure: Abdominal Aortogram;  Surgeon: Wellington Hampshire, MD;  Location: [place] [facility];  Service: Cardiovascular;  Laterality: N/A;  . RETINAL DETACHMENT SURGERY Right 1990s    Current Medications: Current Meds  Medication Sig  .  acetaminophen (TYLENOL) 500 MG tablet Take 1,000 mg by mouth every 6 (six) hours as needed for headache (pain).   [name] ALPRAZolam (XANAX) 0.25 MG tablet Take 0.25 mg by mouth daily as needed for anxiety.   [name] aspirin 81 MG tablet Take 81 mg by mouth daily.  . brimonidine-timolol (COMBIGAN) 0.2-0.5 % ophthalmic solution Place 1 drop into both eyes every 12 (twelve) hours.  . clopidogrel (PLAVIX) 75 MG tablet TAKE 1 TABLET BY MOUTH DAILY  . isosorbide mononitrate (IMDUR) 60 MG 24 hr tablet Take 1 tablet (60 mg total) by mouth daily.  [name] lisinopril (PRINIVIL,ZESTRIL) 10 MG tablet Take 1 tablet (10 mg total) by mouth daily.  . metFORMIN (GLUCOPHAGE) 500 MG tablet Take 1,000 mg by mouth 2 (two) times daily with a meal.   . nitroGLYCERIN (NITROSTAT) 0.4 MG SL tablet Place 0.4 mg under the tongue every 5 (five) minutes as needed for chest pain.  . rosuvastatin (CRESTOR) 20 MG tablet TAKE ONE TABLET BY MOUTH ONCE DAILY AT  6PM  . sertraline (ZOLOFT) 100 MG tablet Take 100 mg by mouth daily.      Allergies:   Niacin and Lipitor [atorvastatin]   Social History   Socioeconomic History  . Marital status: Married    Spouse name: Not on file  . Number of children: Not on file  . Years of education: Not on file  . Highest education level: Not on file  Occupational History  . Not on file  Social Needs  . Financial resource strain: Not on file  . Food insecurity:    Worry: Not on file    Inability: Not on file  . Transportation needs:    Medical: Not on file    Non-medical: Not on file  Tobacco Use  . Smoking status: Former Smoker    Packs/day: 0.50    Years: 15.00    Pack years: 7.50    Types: Cigarettes    Last attempt to quit: 05/11/2004    Years since quitting: 13.6  . Smokeless tobacco: Never Used  Substance and Sexual Activity  . Alcohol use: No  . Drug use: No  . Sexual activity: Not Currently  Lifestyle  . Physical activity:    Days per week: Not on file    Minutes per session: Not  on file  . Stress: Not on file  Relationships  . Social connections:    Talks on phone: Not on file    Gets together: Not on file    Attends religious service: Not on file    Active member of club or organization: Not on file    Attends meetings of clubs or organizations: Not on file    Relationship status: Not on file  Other Topics Concern  . Not on file  Social History Narrative   Lives in [place] with her husband.     Family History: The patient's  family history includes Cancer in her sister; Coronary artery disease (age of onset: 42) in her sister; Coronary artery disease (age of onset: 27) in her mother; Deep vein thrombosis in her cousin; Heart attack (age of onset: 8) in her father.  ROS:   Please see the history of present illness.    ROS  All other systems reviewed and negative.   EKGs/Labs/Other Studies Reviewed:    The following studies were reviewed today: PAP download  EKG:  EKG is not ordered today.   Recent Labs: 02/22/2017: BUN 21; Creatinine, Ser 1.33; Hemoglobin 13.3; Platelets 240; Potassium 4.2; Sodium 141   Recent Lipid Panel    Component Value Date/Time   CHOL 86 (L) 01/27/2016 1034   TRIG 134 01/27/2016 1034   HDL 29 (L) 01/27/2016 1034   CHOLHDL 3.0 01/27/2016 1034   CHOLHDL 3.6 09/06/2015 0957   VLDL 20 09/06/2015 0957   LDLCALC 30 01/27/2016 1034    Physical Exam:    VS:  BP (!) 120/56   Pulse 63   Ht 5\' 7"  (1.702 m)   Wt 171 lb 9.6 oz (77.8 kg)   SpO2 97%   BMI 26.88 kg/m     Wt Readings from Last 3 Encounters:  01/17/18 171 lb 9.6 oz (77.8 kg)  11/19/17 172 lb (78 kg)  06/22/17 175 lb 9.6 oz (79.7 kg)     GEN:  Well nourished, well developed in no acute distress HEENT: Normal NECK: No JVD; No carotid bruits LYMPHATICS: No lymphadenopathy CARDIAC: RRR, no murmurs, rubs, gallops RESPIRATORY:  Clear to auscultation without rales, wheezing or rhonchi  ABDOMEN: Soft, non-tender, non-distended MUSCULOSKELETAL:  No edema; No  deformity  SKIN: Warm and dry NEUROLOGIC:  Alert and oriented x 3 PSYCHIATRIC:  Normal affect   ASSESSMENT:    1. OSA (obstructive sleep apnea)   2. Essential hypertension   3. Obesity (BMI 30-39.9)    PLAN:    In order of problems listed above:  1.   OSA - the patient is tolerating PAP therapy well without any problems. The PAP download was reviewed today and showed an AHI of 2.4/hr on auto BIAPAP with 40% compliance in using more than 4 hours nightly.  The patient has been using and benefiting from PAP use and will continue to benefit from therapy. I have encouraged her to try to be more compliant with her device.   2.  HTN - BP Is controlled on exam today.  She will continue on Lisinopril 10mg  daily.  3.  Obesity - I have encouraged her to get into a routine exercise program and cut back on carbs and portions.    Medication Adjustments/Labs and Tests Ordered: Current medicines are reviewed at length with the patient today.  Concerns regarding medicines are outlined above.  No orders of the defined types were placed in this encounter.  No orders of the defined types were placed in this encounter.   Signed, Fransico Him, MD  01/17/2018 10:05 AM    [place]

## 2018-01-17 ENCOUNTER — Ambulatory Visit (INDEPENDENT_AMBULATORY_CARE_PROVIDER_SITE_OTHER): Payer: Medicare Other | Admitting: Cardiology

## 2018-01-17 ENCOUNTER — Encounter: Payer: Self-pay | Admitting: Cardiology

## 2018-01-17 VITALS — BP 120/56 | HR 63 | Ht 67.0 in | Wt 171.6 lb

## 2018-01-17 DIAGNOSIS — E669 Obesity, unspecified: Secondary | ICD-10-CM | POA: Diagnosis not present

## 2018-01-17 DIAGNOSIS — G4733 Obstructive sleep apnea (adult) (pediatric): Secondary | ICD-10-CM | POA: Diagnosis not present

## 2018-01-17 DIAGNOSIS — I10 Essential (primary) hypertension: Secondary | ICD-10-CM

## 2018-01-17 NOTE — Patient Instructions (Signed)

## 2018-01-28 ENCOUNTER — Encounter (INDEPENDENT_AMBULATORY_CARE_PROVIDER_SITE_OTHER): Payer: Medicare Other | Admitting: Ophthalmology

## 2018-01-28 DIAGNOSIS — H338 Other retinal detachments: Secondary | ICD-10-CM | POA: Diagnosis not present

## 2018-01-28 DIAGNOSIS — E113591 Type 2 diabetes mellitus with proliferative diabetic retinopathy without macular edema, right eye: Secondary | ICD-10-CM | POA: Diagnosis not present

## 2018-01-28 DIAGNOSIS — D3132 Benign neoplasm of left choroid: Secondary | ICD-10-CM

## 2018-01-28 DIAGNOSIS — E113392 Type 2 diabetes mellitus with moderate nonproliferative diabetic retinopathy without macular edema, left eye: Secondary | ICD-10-CM

## 2018-01-28 DIAGNOSIS — H35033 Hypertensive retinopathy, bilateral: Secondary | ICD-10-CM | POA: Diagnosis not present

## 2018-01-28 DIAGNOSIS — E11319 Type 2 diabetes mellitus with unspecified diabetic retinopathy without macular edema: Secondary | ICD-10-CM | POA: Diagnosis not present

## 2018-01-28 DIAGNOSIS — I10 Essential (primary) hypertension: Secondary | ICD-10-CM

## 2018-01-28 DIAGNOSIS — H43813 Vitreous degeneration, bilateral: Secondary | ICD-10-CM

## 2018-02-03 DIAGNOSIS — H40013 Open angle with borderline findings, low risk, bilateral: Secondary | ICD-10-CM | POA: Diagnosis not present

## 2018-06-13 ENCOUNTER — Telehealth: Payer: Self-pay | Admitting: *Deleted

## 2018-06-13 NOTE — Telephone Encounter (Signed)
Unable to leave a message,mailbox is full. 

## 2018-06-20 NOTE — Telephone Encounter (Signed)
Message left,re: follow up visit. 

## 2018-07-27 DIAGNOSIS — L570 Actinic keratosis: Secondary | ICD-10-CM | POA: Diagnosis not present

## 2018-07-27 DIAGNOSIS — X32XXXD Exposure to sunlight, subsequent encounter: Secondary | ICD-10-CM | POA: Diagnosis not present

## 2018-07-27 DIAGNOSIS — L820 Inflamed seborrheic keratosis: Secondary | ICD-10-CM | POA: Diagnosis not present

## 2018-09-27 ENCOUNTER — Ambulatory Visit (INDEPENDENT_AMBULATORY_CARE_PROVIDER_SITE_OTHER): Payer: Medicare Other | Admitting: Cardiovascular Disease

## 2018-09-27 ENCOUNTER — Encounter: Payer: Self-pay | Admitting: Cardiovascular Disease

## 2018-09-27 ENCOUNTER — Other Ambulatory Visit: Payer: Self-pay

## 2018-09-27 VITALS — BP 177/80 | HR 67 | Temp 98.1°F | Ht 67.5 in | Wt 176.0 lb

## 2018-09-27 DIAGNOSIS — I739 Peripheral vascular disease, unspecified: Secondary | ICD-10-CM

## 2018-09-27 DIAGNOSIS — I25118 Atherosclerotic heart disease of native coronary artery with other forms of angina pectoris: Secondary | ICD-10-CM

## 2018-09-27 DIAGNOSIS — R0989 Other specified symptoms and signs involving the circulatory and respiratory systems: Secondary | ICD-10-CM

## 2018-09-27 DIAGNOSIS — E785 Hyperlipidemia, unspecified: Secondary | ICD-10-CM

## 2018-09-27 DIAGNOSIS — I10 Essential (primary) hypertension: Secondary | ICD-10-CM | POA: Diagnosis not present

## 2018-09-27 NOTE — Patient Instructions (Signed)
Medication Instructions:  Your physician recommends that you continue on your current medications as directed. Please refer to the Current Medication list given to you today.  If you need a refill on your cardiac medications before your next appointment, please call your pharmacy.   Lab work: None ordered If you have labs (blood work) drawn today and your tests are completely normal, you will receive your results only by: [place] (if you have MyChart) OR A paper copy in the mail If you have any lab test that is abnormal or we need to change your treatment, we will call you to review the results.  Testing/Procedures: Your physician has requested that you have a carotid duplex. This test is an ultrasound of the carotid arteries in your neck. It looks at blood flow through these arteries that supply the brain with blood. Allow one hour for this exam. There are no restrictions or special instructions. This will take place at [place], [address].    Follow-Up: At [facility], you and your health needs are our priority.  As part of our continuing mission to provide you with exceptional heart care, we have created designated Provider Care Teams.  These Care Teams include your primary Cardiologist (physician) and Advanced Practice Providers (APPs -  Physician Assistants and Nurse Practitioners) who all work together to provide you with the care you need, when you need it. You will need a follow up appointment in 12 months.  Please call our office 2 months in advance to schedule this appointment.  You may see Kathlyn Sacramento, MD or one of the following Advanced Practice Providers on your designated Care Team:   Kerin Ransom, PA-C [address] PA-C [place], [place]

## 2018-09-27 NOTE — Progress Notes (Signed)
Cardiology Office Note   Date:  09/27/2018   ID:  Taylor [name], DOB 1945-07-11, MRN [id number]  PCP:  Lawerance Cruel, MD  Cardiologist:  Dr. Acie Fredrickson  No chief complaint on file.     History of Present Illness: Taylor [name] is a 73 y.o. female who presents for a follow-up visit regarding peripheral arterial disease. She has known history of CAD s/p CABG in 2006, pulmonary embolism in 2014, breast cancer status post left mastectomy in 2011, hyperlipidemia, type 2 diabetes and hypertension. She is not a smoker. She is followed by me for bilateral calf claudication. Lower extremity angiography in November 2016 showed bilateral popliteal artery occlusion. She was treated medically without revascularization. She presented with unstable angina in February 2017. She was found to have critical mid RCA stenosis which was treated by PCI and drug-eluting stent placement.   She had no significant improvement in symptoms with cilostazol.  Most recent vascular studies in May of last year showed an ABI of 0.7 on the right and 0.68 on the left. She has been doing reasonably well with no recent chest pain.  Dyspnea is stable.  She has mild bilateral calf claudication.  No lower extremity ulceration.  She has sleep apnea but has not been using CPAP.  Past Medical History:  Diagnosis Date  . Cancer of left breast ([place])   . Complication of anesthesia   . Coronary artery disease    a. 2006 s/p CABG x 4 (LIMA->LAD, VG->Diag->OM, VG->RPDA);  b. 02/2015: [place] s/p DES to [facility], VG->PDA & VG->D1->OM 100; c. 06/2015 Cath: diffuse dzs->Med rx; d. 11/2015 Cath/PCI: LM nl, LAD 100ost, 95d, D1 90ost, LCX 35ost, 65p, OM2 40, lat OM2 90, RCA 95p (2.75x16 Synergy DES), patent mid stent, RPLB 70, LIMA->LAD ok.EF55-65%.  . Depression   . Hx of tobacco use, presenting hazards to health    a. quit 2006.  [name] Hypercholesteremia   . Hypertension   . Hypertensive heart disease   . Left carotid bruit    a. 09/2014  Carotid U/S: 1-39% bilat ICA stenosis.  [name] PAD (peripheral artery disease) ([place])    a. 11/2014 ABI: R: 0.63, L 0.59;  b. 11/2014 Periph Angio: bilat pop occlusions. L - short w/ reconstituion via collats in dist pop w/ 2 vessel runoff, R long w/ reconstitution in prox tib/peroneal arteries-->med Rx w/ pletal.  . Pulmonary embolism ([place]) a. 2014.  [name] Reflux esophagitis   . Type II diabetes mellitus ([place])     Past Surgical History:  Procedure Laterality Date  . BREAST BIOPSY Left ~ 2012  . BUNIONECTOMY Left 1970s  . CARDIAC CATHETERIZATION  04/2004  . CARDIAC CATHETERIZATION N/A 02/21/2015   Procedure: Left Heart Cath and Cors/Grafts Angiography;  Surgeon: Belva Crome, MD;  Location: [place] [facility];  Service: Cardiovascular;  Laterality: N/A;  . CARDIAC CATHETERIZATION N/A 02/21/2015   Procedure: Coronary Stent Intervention;  Surgeon: Belva Crome, MD;  Location: [place] [facility];  Service: Cardiovascular;  Laterality: N/A;  . CARDIAC CATHETERIZATION N/A 02/21/2015   Procedure: Intravascular Pressure Wire/FFR Study;  Surgeon: Belva Crome, MD;  Location: [place] [facility];  Service: Cardiovascular;  Laterality: N/A;  . CARDIAC CATHETERIZATION N/A 06/20/2015   Procedure: Left Heart Cath and Cors/Grafts Angiography;  Surgeon: Larey Dresser, MD;  Location: [place] [facility];  Service: Cardiovascular;  Laterality: N/A;  . CARDIAC CATHETERIZATION N/A 11/19/2015   Procedure: Left Heart Cath and Cors/Grafts Angiography;  Surgeon: Peter M Martinique, MD;  Location: [place] [facility];  Service: Cardiovascular;  Laterality: N/A;  . CARDIAC CATHETERIZATION N/A 11/19/2015   Procedure: Coronary Stent Intervention;  Surgeon: Peter M Martinique, MD;  Location: [place] [facility];  Service: Cardiovascular;  Laterality: N/A;  . CATARACT EXTRACTION W/ INTRAOCULAR LENS  IMPLANT, BILATERAL Bilateral 1980s-1990s  . CORONARY ANGIOPLASTY WITH STENT PLACEMENT  02/21/2015   "1 stent"  . CORONARY ARTERY BYPASS GRAFT  05/2004    "CABG X4"  . EYE SURGERY    . LAPAROSCOPIC CHOLECYSTECTOMY  1990s  . MASTECTOMY Left ~ 2012  . PERIPHERAL VASCULAR CATHETERIZATION N/A 12/12/2014   Procedure: Abdominal Aortogram;  Surgeon: Wellington Hampshire, MD;  Location: [place] [facility];  Service: Cardiovascular;  Laterality: N/A;  . RETINAL DETACHMENT SURGERY Right 1990s     Current Outpatient Medications  Medication Sig Dispense Refill  . acetaminophen (TYLENOL) 500 MG tablet Take 1,000 mg by mouth every 6 (six) hours as needed for headache (pain).     [name] ALPRAZolam (XANAX) 0.25 MG tablet Take 0.25 mg by mouth daily as needed for anxiety.     [name] aspirin 81 MG tablet Take 81 mg by mouth daily.    . brimonidine-timolol (COMBIGAN) 0.2-0.5 % ophthalmic solution Place 1 drop into both eyes every 12 (twelve) hours.    . clopidogrel (PLAVIX) 75 MG tablet TAKE 1 TABLET BY MOUTH DAILY 90 tablet 2  . isosorbide mononitrate (IMDUR) 60 MG 24 hr tablet Take 1 tablet (60 mg total) by mouth daily. 90 tablet 3  . lisinopril (PRINIVIL,ZESTRIL) 10 MG tablet Take 1 tablet (10 mg total) by mouth daily. 90 tablet 3  . metFORMIN (GLUCOPHAGE) 500 MG tablet Take 1,000 mg by mouth 2 (two) times daily with a meal.     . nitroGLYCERIN (NITROSTAT) 0.4 MG SL tablet Place 0.4 mg under the tongue every 5 (five) minutes as needed for chest pain.    . rosuvastatin (CRESTOR) 20 MG tablet TAKE ONE TABLET BY MOUTH ONCE DAILY AT  6PM 90 tablet 3  . sertraline (ZOLOFT) 100 MG tablet Take 100 mg by mouth daily.      No current facility-administered medications for this visit.    Facility-Administered Medications Ordered in Other Visits  Medication Dose Route Frequency Provider Last Rate Last Dose  . aminophylline injection 75 mg  75 mg Intravenous BID PRN Larey Dresser, MD   75 mg at 08/08/14 1335    Allergies:   Niacin and Lipitor [atorvastatin]    Social History:  The patient  reports that she quit smoking about 14 years ago. Her smoking use included  cigarettes. She has a 7.50 pack-year smoking history. She has never used smokeless tobacco. She reports that she does not drink alcohol or use drugs.   Family History:  The patient's family history includes Cancer in her sister; Coronary artery disease (age of onset: 41) in her sister; Coronary artery disease (age of onset: 66) in her mother; Deep vein thrombosis in her cousin; Heart attack (age of onset: 6) in her father.    ROS:  Please see the history of present illness.   Otherwise, review of systems are positive for none.   All other systems are reviewed and negative.    PHYSICAL EXAM: VS:  BP (!) 177/80   Pulse 67   Temp 98.1 F (36.7 C)   Ht 5' 7.5" (1.715 m)   Wt 176 lb (79.8 kg)   SpO2 97%  BMI 27.16 kg/m  , BMI Body mass index is 27.16 kg/m. GEN: Well nourished, well developed, in no acute distress  HEENT: normal  Neck: no JVD, or masses.  Bilateral carotid bruits Cardiac: RRR; no murmurs, rubs, or gallops,no edema  Respiratory:  clear to auscultation bilaterally, normal work of breathing GI: soft, nontender, nondistended, + BS MS: no deformity or atrophy  Skin: warm and dry, no rash Neuro:  Strength and sensation are intact Psych: euthymic mood, full affect Vascular: Femoral pulses normal bilaterally.  Distal pulses are not palpable.   EKG:  EKG is ordered today. EKG showed sinus bradycardia with no significant ST or T wave changes.   Recent Labs: No results found for requested labs within last 8760 hours.    Lipid Panel    Component Value Date/Time   CHOL 86 (L) 01/27/2016 1034   TRIG 134 01/27/2016 1034   HDL 29 (L) 01/27/2016 1034   CHOLHDL 3.0 01/27/2016 1034   CHOLHDL 3.6 09/06/2015 0957   VLDL 20 09/06/2015 0957   LDLCALC 30 01/27/2016 1034      Wt Readings from Last 3 Encounters:  09/27/18 176 lb (79.8 kg)  01/17/18 171 lb 9.6 oz (77.8 kg)  11/19/17 172 lb (78 kg)       ASSESSMENT AND PLAN:  1.  Peripheral arterial disease: She has  known bilateral popliteal artery occlusion.  She has mild bilateral calf claudication with no evidence of critical limb ischemia.  Continue medical therapy.   2. Coronary artery disease status post CABG with other forms of angina:    No significant change in symptoms.  3.  Bilateral carotid bruits with borderline stenosis on the right side: No evaluation since 2018.  Thus, I requested a follow-up carotid Doppler.    4. Hyperlipidemia: Most recent LDL was 30. Continue treatment with rosuvastatin.  5.  Essential hypertension: Her blood pressure is elevated today but is usually well controlled and thus I made no changes in her medications.   Disposition:   FU with me in 1 year  Signed,  Kathlyn Sacramento, MD  09/27/2018 9:38 AM    [place]

## 2018-10-10 ENCOUNTER — Ambulatory Visit (HOSPITAL_COMMUNITY)
Admission: RE | Admit: 2018-10-10 | Discharge: 2018-10-10 | Disposition: A | Discharge status: Home / Self Care | Payer: Medicare Other | Source: Ambulatory Visit | Attending: Cardiovascular Disease | Admitting: Cardiovascular Disease

## 2018-10-10 ENCOUNTER — Other Ambulatory Visit: Payer: Self-pay

## 2018-10-10 DIAGNOSIS — R0989 Other specified symptoms and signs involving the circulatory and respiratory systems: Secondary | ICD-10-CM | POA: Diagnosis not present

## 2018-10-18 ENCOUNTER — Other Ambulatory Visit: Payer: Self-pay | Admitting: Cardiovascular Disease

## 2018-10-24 DIAGNOSIS — E7800 Pure hypercholesterolemia, unspecified: Secondary | ICD-10-CM | POA: Diagnosis not present

## 2018-10-24 DIAGNOSIS — I10 Essential (primary) hypertension: Secondary | ICD-10-CM | POA: Diagnosis not present

## 2018-10-24 DIAGNOSIS — F419 Anxiety disorder, unspecified: Secondary | ICD-10-CM | POA: Diagnosis not present

## 2018-10-24 DIAGNOSIS — Z0000 Encounter for general adult medical examination without abnormal findings: Secondary | ICD-10-CM | POA: Diagnosis not present

## 2018-10-24 DIAGNOSIS — E1142 Type 2 diabetes mellitus with diabetic polyneuropathy: Secondary | ICD-10-CM | POA: Diagnosis not present

## 2018-10-24 DIAGNOSIS — I739 Peripheral vascular disease, unspecified: Secondary | ICD-10-CM | POA: Diagnosis not present

## 2018-11-01 DIAGNOSIS — E7800 Pure hypercholesterolemia, unspecified: Secondary | ICD-10-CM | POA: Diagnosis not present

## 2018-11-01 DIAGNOSIS — Z0000 Encounter for general adult medical examination without abnormal findings: Secondary | ICD-10-CM | POA: Diagnosis not present

## 2018-11-01 DIAGNOSIS — I739 Peripheral vascular disease, unspecified: Secondary | ICD-10-CM | POA: Diagnosis not present

## 2018-11-01 DIAGNOSIS — F419 Anxiety disorder, unspecified: Secondary | ICD-10-CM | POA: Diagnosis not present

## 2018-11-01 DIAGNOSIS — E1142 Type 2 diabetes mellitus with diabetic polyneuropathy: Secondary | ICD-10-CM | POA: Diagnosis not present

## 2018-11-01 DIAGNOSIS — I10 Essential (primary) hypertension: Secondary | ICD-10-CM | POA: Diagnosis not present

## 2018-11-01 DIAGNOSIS — Z23 Encounter for immunization: Secondary | ICD-10-CM | POA: Diagnosis not present

## 2018-11-08 ENCOUNTER — Other Ambulatory Visit: Payer: Self-pay

## 2018-11-08 ENCOUNTER — Encounter (INDEPENDENT_AMBULATORY_CARE_PROVIDER_SITE_OTHER): Payer: Medicare Other | Admitting: Ophthalmology

## 2018-11-08 DIAGNOSIS — E113392 Type 2 diabetes mellitus with moderate nonproliferative diabetic retinopathy without macular edema, left eye: Secondary | ICD-10-CM | POA: Diagnosis not present

## 2018-11-08 DIAGNOSIS — I10 Essential (primary) hypertension: Secondary | ICD-10-CM | POA: Diagnosis not present

## 2018-11-08 DIAGNOSIS — H338 Other retinal detachments: Secondary | ICD-10-CM | POA: Diagnosis not present

## 2018-11-08 DIAGNOSIS — H35033 Hypertensive retinopathy, bilateral: Secondary | ICD-10-CM

## 2018-11-08 DIAGNOSIS — E113591 Type 2 diabetes mellitus with proliferative diabetic retinopathy without macular edema, right eye: Secondary | ICD-10-CM

## 2018-11-08 DIAGNOSIS — H43813 Vitreous degeneration, bilateral: Secondary | ICD-10-CM | POA: Diagnosis not present

## 2018-11-08 DIAGNOSIS — E11319 Type 2 diabetes mellitus with unspecified diabetic retinopathy without macular edema: Secondary | ICD-10-CM

## 2018-11-08 DIAGNOSIS — D132 Benign neoplasm of duodenum: Secondary | ICD-10-CM | POA: Diagnosis not present

## 2018-11-28 DIAGNOSIS — I739 Peripheral vascular disease, unspecified: Secondary | ICD-10-CM | POA: Diagnosis not present

## 2018-11-28 DIAGNOSIS — L03119 Cellulitis of unspecified part of limb: Secondary | ICD-10-CM | POA: Diagnosis not present

## 2018-11-28 DIAGNOSIS — E1142 Type 2 diabetes mellitus with diabetic polyneuropathy: Secondary | ICD-10-CM | POA: Diagnosis not present

## 2018-11-28 DIAGNOSIS — M545 Low back pain: Secondary | ICD-10-CM | POA: Diagnosis not present

## 2018-11-28 DIAGNOSIS — B353 Tinea pedis: Secondary | ICD-10-CM | POA: Diagnosis not present

## 2018-11-28 DIAGNOSIS — N183 Chronic kidney disease, stage 3 unspecified: Secondary | ICD-10-CM | POA: Diagnosis not present

## 2018-12-01 DIAGNOSIS — E1142 Type 2 diabetes mellitus with diabetic polyneuropathy: Secondary | ICD-10-CM | POA: Diagnosis not present

## 2018-12-01 DIAGNOSIS — L84 Corns and callosities: Secondary | ICD-10-CM | POA: Diagnosis not present

## 2018-12-01 DIAGNOSIS — N183 Chronic kidney disease, stage 3 unspecified: Secondary | ICD-10-CM | POA: Diagnosis not present

## 2018-12-13 ENCOUNTER — Other Ambulatory Visit: Payer: Self-pay

## 2018-12-13 ENCOUNTER — Ambulatory Visit (INDEPENDENT_AMBULATORY_CARE_PROVIDER_SITE_OTHER): Payer: Medicare Other | Admitting: Cardiovascular Disease

## 2018-12-13 ENCOUNTER — Encounter: Payer: Self-pay | Admitting: Cardiovascular Disease

## 2018-12-13 ENCOUNTER — Encounter: Payer: Self-pay | Admitting: *Deleted

## 2018-12-13 VITALS — BP 128/72 | HR 71 | Ht 67.5 in | Wt 175.1 lb

## 2018-12-13 DIAGNOSIS — R5383 Other fatigue: Secondary | ICD-10-CM | POA: Diagnosis not present

## 2018-12-13 DIAGNOSIS — I10 Essential (primary) hypertension: Secondary | ICD-10-CM

## 2018-12-13 DIAGNOSIS — R0600 Dyspnea, unspecified: Secondary | ICD-10-CM | POA: Diagnosis not present

## 2018-12-13 DIAGNOSIS — I25708 Atherosclerosis of coronary artery bypass graft(s), unspecified, with other forms of angina pectoris: Secondary | ICD-10-CM | POA: Diagnosis not present

## 2018-12-13 DIAGNOSIS — E7800 Pure hypercholesterolemia, unspecified: Secondary | ICD-10-CM | POA: Diagnosis not present

## 2018-12-13 DIAGNOSIS — I25118 Atherosclerotic heart disease of native coronary artery with other forms of angina pectoris: Secondary | ICD-10-CM | POA: Diagnosis not present

## 2018-12-13 DIAGNOSIS — R0609 Other forms of dyspnea: Secondary | ICD-10-CM

## 2018-12-13 MED ORDER — NITROGLYCERIN 0.4 MG SL SUBL: 0.4000 mg | SUBLINGUAL_TABLET | SUBLINGUAL | 3 refills | Status: DC | PRN

## 2018-12-13 NOTE — Patient Instructions (Signed)
Medication Instructions:   Your physician recommends that you continue on your current medications as directed. Please refer to the Current Medication list given to you today.  *If you need a refill on your cardiac medications before your next appointment, please call your pharmacy*   Testing/Procedures:  Your physician has requested that you have a lexiscan myoview. For further information please visit [email or web address]. Please follow instruction sheet, as given.   Your physician has requested that you have an echocardiogram. Echocardiography is a painless test that uses sound waves to create images of your heart. It provides your doctor with information about the size and shape of your heart and how well your heart's chambers and valves are working. This procedure takes approximately one hour. There are no restrictions for this procedure.    Follow-Up: At [facility], you and your health needs are our priority.  As part of our continuing mission to provide you with exceptional heart care, we have created designated Provider Care Teams.  These Care Teams include your primary Cardiologist (physician) and Advanced Practice Providers (APPs -  Physician Assistants and Nurse Practitioners) who all work together to provide you with the care you need, when you need it.  Your next appointment:   3 month(s)  The format for your next appointment:   In Person  Provider:   WITH AN EXTENDER IN OUR OFFICE OR ON DR. Elmarie Shiley TEAM

## 2018-12-13 NOTE — Progress Notes (Signed)
Cardiology Office Note:    Date:  12/13/2018   ID:  Taylor [name], DOB 1945/04/04, MRN [id number]  PCP:  Lawerance Cruel, MD  Cardiologist:  Mertie Moores, MD    Referring MD: Lawerance Cruel, MD   Problem list 1. Coronary artery disease-status post coronary artery bypass grafting 2006 2. Peripheral vascular disease 3. History pulmonary embolus 4. History of breast cancer 5. Essential hypertension 6. Hyperlipidemia 7. Carotid artery disease 8. Diabetes  mellitus   Chief Complaint  Patient presents with  . Coronary Artery Disease  . PAD        Taylor [name] is a 73 y.o. female with a hx of Coronary artery disease, hypertension, hyperlipidemia and peripheral vascular disease. She denies having any episodes of chest pain or shortness of breath. She complains of having lots of leg aches and pains. Symptoms do not sound like claudication. She has known occlusion of her popliteal arteries.  Nov. 8, 2019: Taylor [name] has a history of coronary artery disease and coronary artery bypass grafting in 2008.  She also has a history of hypertension, hyperlipidemia and peripheral vascular disease.  Is having DOE  Walking is limited by her claudication  Sees Dr. [name] for her PVC  On plavix after a stento placement in Nov. 2017   December 13, 2018:  Taylor [name] is seen today for follow-up of her coronary artery disease status post coronary artery bypass grafting in 2008.  She has a history of hypertension, hyperlipidemia, PAD.  She has been followed by Dr. [name] for PAD. No angina pain  Has palpitations  Has worsening DOE for the past 5 months  Has not had much cp with her angina episodes  Typically weakness and DOE are her angina equivalent .  Lipids have been managed by her primary medical doctor and have typically looked good.  Past Medical History:  Diagnosis Date  . Cancer of left breast ([place])   . Complication of anesthesia   . Coronary artery disease    a. 2006 s/p CABG x 4  (LIMA->LAD, VG->Diag->OM, VG->RPDA);  b. 02/2015: [place] s/p DES to [facility], VG->PDA & VG->D1->OM 100; c. 06/2015 Cath: diffuse dzs->Med rx; d. 11/2015 Cath/PCI: LM nl, LAD 100ost, 95d, D1 90ost, LCX 35ost, 65p, OM2 40, lat OM2 90, RCA 95p (2.75x16 Synergy DES), patent mid stent, RPLB 70, LIMA->LAD ok.EF55-65%.  . Depression   . Hx of tobacco use, presenting hazards to health    a. quit 2006.  Taylor [name] Hypercholesteremia   . Hypertension   . Hypertensive heart disease   . Left carotid bruit    a. 09/2014 Carotid U/S: 1-39% bilat ICA stenosis.  Taylor [name] PAD (peripheral artery disease) ([place])    a. 11/2014 ABI: R: 0.63, L 0.59;  b. 11/2014 Periph Angio: bilat pop occlusions. L - short w/ reconstituion via collats in dist pop w/ 2 vessel runoff, R long w/ reconstitution in prox tib/peroneal arteries-->med Rx w/ pletal.  . Pulmonary embolism ([place]) a. 2014.  Taylor [name] Reflux esophagitis   . Type II diabetes mellitus ([place])     Past Surgical History:  Procedure Laterality Date  . BREAST BIOPSY Left ~ 2012  . BUNIONECTOMY Left 1970s  . CARDIAC CATHETERIZATION  04/2004  . CARDIAC CATHETERIZATION N/A 02/21/2015   Procedure: Left Heart Cath and Cors/Grafts Angiography;  Surgeon: Belva Crome, MD;  Location: [place] [facility];  Service: Cardiovascular;  Laterality: N/A;  . CARDIAC CATHETERIZATION N/A 02/21/2015   Procedure: Coronary Stent Intervention;  Surgeon: Lynnell Dike  Tamala Julian, MD;  Location: [place] [facility];  Service: Cardiovascular;  Laterality: N/A;  . CARDIAC CATHETERIZATION N/A 02/21/2015   Procedure: Intravascular Pressure Wire/FFR Study;  Surgeon: Belva Crome, MD;  Location: [place] [facility];  Service: Cardiovascular;  Laterality: N/A;  . CARDIAC CATHETERIZATION N/A 06/20/2015   Procedure: Left Heart Cath and Cors/Grafts Angiography;  Surgeon: Taylor Dresser, MD;  Location: [place] [facility];  Service: Cardiovascular;  Laterality: N/A;  . CARDIAC CATHETERIZATION N/A 11/19/2015   Procedure: Left Heart Cath and Cors/Grafts  Angiography;  Surgeon: Taylor M Martinique, MD;  Location: [place] [facility];  Service: Cardiovascular;  Laterality: N/A;  . CARDIAC CATHETERIZATION N/A 11/19/2015   Procedure: Coronary Stent Intervention;  Surgeon: Taylor M Martinique, MD;  Location: [place] [facility];  Service: Cardiovascular;  Laterality: N/A;  . CATARACT EXTRACTION W/ INTRAOCULAR LENS  IMPLANT, BILATERAL Bilateral 1980s-1990s  . CORONARY ANGIOPLASTY WITH STENT PLACEMENT  02/21/2015   "1 stent"  . CORONARY ARTERY BYPASS GRAFT  05/2004   "CABG X4"  . EYE SURGERY    . LAPAROSCOPIC CHOLECYSTECTOMY  1990s  . MASTECTOMY Left ~ 2012  . PERIPHERAL VASCULAR CATHETERIZATION N/A 12/12/2014   Procedure: Abdominal Aortogram;  Surgeon: Taylor Hampshire, MD;  Location: [place] [facility];  Service: Cardiovascular;  Laterality: N/A;  . RETINAL DETACHMENT SURGERY Right 1990s    Current Medications: Current Meds  Medication Sig  . acetaminophen (TYLENOL) 500 MG tablet Take 1,000 mg by mouth every 6 (six) hours as needed for headache (pain).   Taylor [name] ALPRAZolam (XANAX) 0.25 MG tablet Take 0.25 mg by mouth daily as needed for anxiety.   Taylor [name] aspirin 81 MG tablet Take 81 mg by mouth daily.  . brimonidine-timolol (COMBIGAN) 0.2-0.5 % ophthalmic solution Place 1 drop into both eyes every 12 (twelve) hours.  . clopidogrel (PLAVIX) 75 MG tablet Take 1 tablet by mouth once daily  . isosorbide mononitrate (IMDUR) 60 MG 24 hr tablet Take 1 tablet (60 mg total) by mouth daily.  Taylor [name] lisinopril (PRINIVIL,ZESTRIL) 10 MG tablet Take 1 tablet (10 mg total) by mouth daily.  . metFORMIN (GLUCOPHAGE) 500 MG tablet Take 1,000 mg by mouth 2 (two) times daily with a meal.   . nitroGLYCERIN (NITROSTAT) 0.4 MG SL tablet Place 1 tablet (0.4 mg total) under the tongue every 5 (five) minutes as needed for chest pain.  . rosuvastatin (CRESTOR) 20 MG tablet TAKE ONE TABLET BY MOUTH ONCE DAILY AT  6PM  . sertraline (ZOLOFT) 100 MG tablet Take 100 mg by mouth daily.   . [DISCONTINUED]  nitroGLYCERIN (NITROSTAT) 0.4 MG SL tablet Place 0.4 mg under the tongue every 5 (five) minutes as needed for chest pain.     Allergies:   Niacin and Lipitor [atorvastatin]   Social History   Socioeconomic History  . Marital status: Married    Spouse name: Not on file  . Number of children: Not on file  . Years of education: Not on file  . Highest education level: Not on file  Occupational History  . Not on file  Social Needs  . Financial resource strain: Not on file  . Food insecurity    Worry: Not on file    Inability: Not on file  . Transportation needs    Medical: Not on file    Non-medical: Not on file  Tobacco Use  . Smoking status: Former Smoker    Packs/day: 0.50    Years: 15.00    Pack years:  7.50    Types: Cigarettes    Quit date: 05/11/2004    Years since quitting: 14.6  . Smokeless tobacco: Never Used  Substance and Sexual Activity  . Alcohol use: No  . Drug use: No  . Sexual activity: Not Currently  Lifestyle  . Physical activity    Days per week: Not on file    Minutes per session: Not on file  . Stress: Not on file  Relationships  . Social Herbalist on phone: Not on file    Gets together: Not on file    Attends religious service: Not on file    Active member of club or organization: Not on file    Attends meetings of clubs or organizations: Not on file    Relationship status: Not on file  Other Topics Concern  . Not on file  Social History Narrative   Lives in [place] with her husband.     Family History: The patient's family history includes Cancer in her sister; Coronary artery disease (age of onset: 58) in her sister; Coronary artery disease (age of onset: 12) in her mother; Deep vein thrombosis in her cousin; Heart attack (age of onset: 23) in her father. ROS:   Please see the history of present illness.     All other systems reviewed and are negative.  EKGs/Labs/Other Studies Reviewed:    The following studies were  reviewed today:   EKG:   .     Recent Labs: No results found for requested labs within last 8760 hours.  Recent Lipid Panel    Component Value Date/Time   CHOL 86 (L) 01/27/2016 1034   TRIG 134 01/27/2016 1034   HDL 29 (L) 01/27/2016 1034   CHOLHDL 3.0 01/27/2016 1034   CHOLHDL 3.6 09/06/2015 0957   VLDL 20 09/06/2015 0957   LDLCALC 30 01/27/2016 1034    Physical Exam: Blood pressure 128/72, pulse 71, height 5' 7.5" (1.715 m), weight 175 lb 1.9 oz (79.4 kg), SpO2 98 %.  GEN:  Well nourished, well developed in no acute distress HEENT: Normal NECK: No JVD; left left carotid bruit  LYMPHATICS: No lymphadenopathy CARDIAC: RRR , no murmurs, rubs, gallops RESPIRATORY:  Clear to auscultation without rales, wheezing or rhonchi  ABDOMEN: Soft, non-tender, non-distended MUSCULOSKELETAL:  No edema; No deformity  SKIN: Warm and dry NEUROLOGIC:  Alert and oriented x 3    ASSESSMENT:    1. DOE (dyspnea on exertion)   2. Coronary artery disease involving native coronary artery of native heart with other form of angina pectoris ([place])   3. Fatigue, unspecified type   4. Atherosclerosis of coronary artery bypass graft of native heart with other forms of angina pectoris ([place])   5. Essential hypertension   6. HYPERCHOLESTEROLEMIA    PLAN:    In order of problems listed above:   1. 1. Coronary artery disease: . History history of coronary artery disease.  She has been having worsening shortness of breath with exertion and generalized fatigue.  These have been her anginal equivalents in the past.  I have advised her to try taking her nitroglycerin with the symptoms to see if they resolve faster.  We will schedule her for a Lexiscan Myoview study and will also get an echocardiogram.  We will have her return to see an APP in 3 months.  2. HL -lipid levels have been managed by Dr. Harrington Challenger in the past.  I do not have a copy of the most  recent levels but she said that they overall looked well  controlled.   3. HTN - blood pressure is well controlled.  4. Carotid artery disease -  Stable   5. PAD -followed by Dr. [name]   Medication Adjustments/Labs and Tests Ordered: Current medicines are reviewed at length with the patient today.  Concerns regarding medicines are outlined above.  Orders Placed This Encounter  Procedures  . Myocardial Perfusion Imaging  . ECHOCARDIOGRAM COMPLETE   Meds ordered this encounter  Medications  . nitroGLYCERIN (NITROSTAT) 0.4 MG SL tablet    Sig: Place 1 tablet (0.4 mg total) under the tongue every 5 (five) minutes as needed for chest pain.    Dispense:  30 tablet    Refill:  3     Signed, Mertie Moores, MD  12/13/2018 10:38 AM    [place]

## 2018-12-22 ENCOUNTER — Telehealth (HOSPITAL_COMMUNITY): Payer: Self-pay | Admitting: *Deleted

## 2018-12-22 NOTE — Telephone Encounter (Signed)
Left message on voicemail per DPR in reference to upcoming appointment scheduled on 12/28/18 with detailed instructions given per Myocardial Perfusion Study Information Sheet for the test. LM to arrive 15 minutes early, and that it is imperative to arrive on time for appointment to keep from having the test rescheduled. If you need to cancel or reschedule your appointment, please call the office within 24 hours of your appointment. Failure to do so may result in a cancellation of your appointment, and a $50 no show fee. Phone number given for call back for any questions. Kirstie Peri

## 2018-12-28 ENCOUNTER — Other Ambulatory Visit (HOSPITAL_COMMUNITY): Payer: Medicare Other

## 2018-12-28 ENCOUNTER — Encounter (HOSPITAL_COMMUNITY): Payer: Medicare Other

## 2018-12-29 ENCOUNTER — Telehealth (HOSPITAL_COMMUNITY): Payer: Self-pay

## 2018-12-29 NOTE — Telephone Encounter (Signed)
Spoke with the patient, instructions were given. She stated that she would be here for her test. Asked to call back with any questions. S.Kimo Bancroft

## 2019-01-04 ENCOUNTER — Ambulatory Visit (HOSPITAL_COMMUNITY): Payer: Medicare Other | Attending: Cardiology

## 2019-01-04 ENCOUNTER — Ambulatory Visit (HOSPITAL_BASED_OUTPATIENT_CLINIC_OR_DEPARTMENT_OTHER): Payer: Medicare Other

## 2019-01-04 ENCOUNTER — Other Ambulatory Visit: Payer: Self-pay

## 2019-01-04 VITALS — Ht 67.5 in | Wt 175.0 lb

## 2019-01-04 DIAGNOSIS — R5383 Other fatigue: Secondary | ICD-10-CM | POA: Diagnosis not present

## 2019-01-04 DIAGNOSIS — R0600 Dyspnea, unspecified: Secondary | ICD-10-CM

## 2019-01-04 DIAGNOSIS — I25118 Atherosclerotic heart disease of native coronary artery with other forms of angina pectoris: Secondary | ICD-10-CM | POA: Diagnosis not present

## 2019-01-04 DIAGNOSIS — R110 Nausea: Secondary | ICD-10-CM | POA: Diagnosis present

## 2019-01-04 DIAGNOSIS — R0609 Other forms of dyspnea: Secondary | ICD-10-CM

## 2019-01-04 LAB — MYOCARDIAL PERFUSION IMAGING
LV dias vol: 89 mL (ref 46–106)
LV sys vol: 42 mL
Peak HR: 65 {beats}/min
Rest HR: 44 {beats}/min
SDS: 0
SRS: 0
SSS: 0
TID: 1.09

## 2019-01-04 MED ORDER — TECHNETIUM TC 99M TETROFOSMIN IV KIT
10.4000 | PACK | Freq: Once | INTRAVENOUS | Status: AC | PRN
  Administered 2019-01-04: 10.4 via INTRAVENOUS
  Filled 2019-01-04: qty 11

## 2019-01-04 MED ORDER — REGADENOSON 0.4 MG/5ML IV SOLN
0.4000 mg | Freq: Once | INTRAVENOUS | Status: AC
  Administered 2019-01-04: 0.4 mg via INTRAVENOUS

## 2019-01-04 MED ORDER — TECHNETIUM TC 99M TETROFOSMIN IV KIT
32.5000 | PACK | Freq: Once | INTRAVENOUS | Status: AC | PRN
  Administered 2019-01-04: 32.5 via INTRAVENOUS
  Filled 2019-01-04: qty 33

## 2019-01-04 MED ORDER — AMINOPHYLLINE 25 MG/ML IV SOLN
75.0000 mg | Freq: Once | INTRAVENOUS | Status: AC
  Administered 2019-01-04: 75 mg via INTRAVENOUS

## 2019-01-09 ENCOUNTER — Other Ambulatory Visit: Payer: Self-pay

## 2019-01-09 MED ORDER — ISOSORBIDE MONONITRATE ER 60 MG PO TB24: 60.0000 mg | ORAL_TABLET | Freq: Every day | ORAL | 3 refills | Status: DC

## 2019-01-11 ENCOUNTER — Telehealth: Payer: Self-pay | Admitting: *Deleted

## 2019-01-11 NOTE — Telephone Encounter (Signed)

## 2019-01-12 ENCOUNTER — Telehealth: Payer: Self-pay | Admitting: Cardiovascular Disease

## 2019-01-12 NOTE — Telephone Encounter (Signed)
Returned call to Pt.  Advised of her ECHO and stress test results.    Pt will recommit to eating healthier and increasing her activity.

## 2019-01-12 NOTE — Telephone Encounter (Signed)
Patient is requesting test results from her echocardiogram. Please call.

## 2019-01-17 ENCOUNTER — Other Ambulatory Visit: Payer: Self-pay

## 2019-01-17 ENCOUNTER — Telehealth (INDEPENDENT_AMBULATORY_CARE_PROVIDER_SITE_OTHER): Payer: Medicare Other | Admitting: Cardiology

## 2019-01-17 ENCOUNTER — Telehealth: Payer: Self-pay | Admitting: *Deleted

## 2019-01-17 VITALS — Ht 67.5 in | Wt 172.0 lb

## 2019-01-17 DIAGNOSIS — G4733 Obstructive sleep apnea (adult) (pediatric): Secondary | ICD-10-CM | POA: Diagnosis not present

## 2019-01-17 DIAGNOSIS — E119 Type 2 diabetes mellitus without complications: Secondary | ICD-10-CM

## 2019-01-17 DIAGNOSIS — I119 Hypertensive heart disease without heart failure: Secondary | ICD-10-CM

## 2019-01-17 NOTE — Telephone Encounter (Signed)
-----   Message from Sueanne Margarita, MD sent at 01/17/2019  9:49 AM EST ----- Order PAP supplies for the next year.  Add nasal saline spray 2 sprays each nostril twice daily.  Followup with me in  1year

## 2019-01-17 NOTE — Progress Notes (Signed)
Virtual Visit via Telephone Note   This visit type was conducted due to national recommendations for restrictions regarding the COVID-19 Pandemic (e.g. social distancing) in an effort to limit this patient's exposure and mitigate transmission in our community.  Due to her co-morbid illnesses, this patient is at least at moderate risk for complications without adequate follow up.  This format is felt to be most appropriate for this patient at this time.  The patient did not have access to video technology/had technical difficulties with video requiring transitioning to audio format only (telephone).  All issues noted in this document were discussed and addressed.  No physical exam could be performed with this format.  Please refer to the patient's chart for her  consent to telehealth for [facility].   Evaluation Performed:  Follow-up visit  This visit type was conducted due to national recommendations for restrictions regarding the COVID-19 Pandemic (e.g. social distancing).  This format is felt to be most appropriate for this patient at this time.  All issues noted in this document were discussed and addressed.  No physical exam was performed (except for noted visual exam findings with Video Visits).  Please refer to the patient's chart (MyChart message for video visits and phone note for telephone visits) for the patient's consent to telehealth for [facility].  Date:  01/17/2019   ID:  Taylor [name], DOB 1945-12-21, MRN [id number]  Patient Location:  Home  Provider location:   [place]  PCP:  Lawerance Cruel, MD  Cardiologist: Grayland Jack, MD Sleep Medicine:  Fransico Him, MD Electrophysiologist:  None   Chief Complaint:  OSA  History of Present Illness:    Taylor [name] is a 74 y.o. female who presents via audio/video conferencing for a telehealth visit today.    Taylor [name] is a 74y.o. female with a hx of moderate OSA with an AHI of 16.3/hr, loud snoring and  oxygen desaturations as low as 83%. She underwent PAP titration but could not be adequately titrated onPAPand is now on auto BIPAP. She is doing well with her PAP device and thinks that she has gotten used to it.  She tolerates the full face mask and feels the pressure is adequate.  Since going on PAP She feels rested in the am and has no significant daytime sleepiness.  She has had some problems with mouth dryness.  She has not adjusted the humidity on her device.  She does not think that he snores.    The patient does not have symptoms concerning for COVID-19 infection (fever, chills, cough, or new shortness of breath).    Prior CV studies:   The following studies were reviewed today:  PAP compliance download  Past Medical History:  Diagnosis Date  . Cancer of left breast ([place])   . Complication of anesthesia   . Coronary artery disease    a. 2006 s/p CABG x 4 (LIMA->LAD, VG->Diag->OM, VG->RPDA);  b. 02/2015: [place] s/p DES to [facility], VG->PDA & VG->D1->OM 100; c. 06/2015 Cath: diffuse dzs->Med rx; d. 11/2015 Cath/PCI: LM nl, LAD 100ost, 95d, D1 90ost, LCX 35ost, 65p, OM2 40, lat OM2 90, RCA 95p (2.75x16 Synergy DES), patent mid stent, RPLB 70, LIMA->LAD ok.EF55-65%.  . Depression   . Hx of tobacco use, presenting hazards to health    a. quit 2006.  [name] Hypercholesteremia   . Hypertension   . Hypertensive heart disease   . Left carotid bruit    a. 09/2014 Carotid U/S: 1-39% bilat  ICA stenosis.  [name] PAD (peripheral artery disease) ([place])    a. 11/2014 ABI: R: 0.63, L 0.59;  b. 11/2014 Periph Angio: bilat pop occlusions. L - short w/ reconstituion via collats in dist pop w/ 2 vessel runoff, R long w/ reconstitution in prox tib/peroneal arteries-->med Rx w/ pletal.  . Pulmonary embolism ([place]) a. 2014.  [name] Reflux esophagitis   . Type II diabetes mellitus ([place])    Past Surgical History:  Procedure Laterality Date  . BREAST BIOPSY Left ~ 2012  . BUNIONECTOMY Left 1970s  . CARDIAC CATHETERIZATION   04/2004  . CARDIAC CATHETERIZATION N/A 02/21/2015   Procedure: Left Heart Cath and Cors/Grafts Angiography;  Surgeon: Belva Crome, MD;  Location: [place] [facility];  Service: Cardiovascular;  Laterality: N/A;  . CARDIAC CATHETERIZATION N/A 02/21/2015   Procedure: Coronary Stent Intervention;  Surgeon: Belva Crome, MD;  Location: [place] [facility];  Service: Cardiovascular;  Laterality: N/A;  . CARDIAC CATHETERIZATION N/A 02/21/2015   Procedure: Intravascular Pressure Wire/FFR Study;  Surgeon: Belva Crome, MD;  Location: [place] CV [facility];  Service: Cardiovascular;  Laterality: N/A;  . CARDIAC CATHETERIZATION N/A 06/20/2015   Procedure: Left Heart Cath and Cors/Grafts Angiography;  Surgeon: Larey Dresser, MD;  Location: [place] CV [facility];  Service: Cardiovascular;  Laterality: N/A;  . CARDIAC CATHETERIZATION N/A 11/19/2015   Procedure: Left Heart Cath and Cors/Grafts Angiography;  Surgeon: Peter M Martinique, MD;  Location: [place] CV [facility];  Service: Cardiovascular;  Laterality: N/A;  . CARDIAC CATHETERIZATION N/A 11/19/2015   Procedure: Coronary Stent Intervention;  Surgeon: Peter M Martinique, MD;  Location: [place] CV [facility];  Service: Cardiovascular;  Laterality: N/A;  . CATARACT EXTRACTION W/ INTRAOCULAR LENS  IMPLANT, BILATERAL Bilateral 1980s-1990s  . CORONARY ANGIOPLASTY WITH STENT PLACEMENT  02/21/2015   "1 stent"  . CORONARY ARTERY BYPASS GRAFT  05/2004   "CABG X4"  . EYE SURGERY    . LAPAROSCOPIC CHOLECYSTECTOMY  1990s  . MASTECTOMY Left ~ 2012  . PERIPHERAL VASCULAR CATHETERIZATION N/A 12/12/2014   Procedure: Abdominal Aortogram;  Surgeon: Wellington Hampshire, MD;  Location: [place] CV [facility];  Service: Cardiovascular;  Laterality: N/A;  . RETINAL DETACHMENT SURGERY Right 1990s     Current Meds  Medication Sig  . acetaminophen (TYLENOL) 500 MG tablet Take 1,000 mg by mouth every 6 (six) hours as needed for headache (pain).   [name] ALPRAZolam (XANAX) 0.25 MG tablet Take 0.25 mg by mouth  daily as needed for anxiety.   [name] aspirin 81 MG tablet Take 81 mg by mouth daily.  . brimonidine-timolol (COMBIGAN) 0.2-0.5 % ophthalmic solution Place 1 drop into both eyes every 12 (twelve) hours.  . clopidogrel (PLAVIX) 75 MG tablet Take 1 tablet by mouth once daily  . isosorbide mononitrate (IMDUR) 60 MG 24 hr tablet Take 1 tablet (60 mg total) by mouth daily.  [name] lisinopril (PRINIVIL,ZESTRIL) 10 MG tablet Take 1 tablet (10 mg total) by mouth daily.  . metFORMIN (GLUCOPHAGE) 500 MG tablet Take 1,000 mg by mouth 2 (two) times daily with a meal.   . nitroGLYCERIN (NITROSTAT) 0.4 MG SL tablet Place 1 tablet (0.4 mg total) under the tongue every 5 (five) minutes as needed for chest pain.  . rosuvastatin (CRESTOR) 20 MG tablet TAKE ONE TABLET BY MOUTH ONCE DAILY AT  6PM  . sertraline (ZOLOFT) 100 MG tablet Take 100 mg by mouth daily.      Allergies:   Niacin and Lipitor [atorvastatin]  Social History   Tobacco Use  . Smoking status: Former Smoker    Packs/day: 0.50    Years: 15.00    Pack years: 7.50    Types: Cigarettes    Quit date: 05/11/2004    Years since quitting: 14.6  . Smokeless tobacco: Never Used  Substance Use Topics  . Alcohol use: No  . Drug use: No     Family Hx: The patient's family history includes Cancer in her sister; Coronary artery disease (age of onset: 36) in her sister; Coronary artery disease (age of onset: 47) in her mother; Deep vein thrombosis in her cousin; Heart attack (age of onset: 25) in her father.  ROS:   Please see the history of present illness.     All other systems reviewed and are negative.   Labs/Other Tests and Data Reviewed:    Recent Labs: No results found for requested labs within last 8760 hours.   Recent Lipid Panel Lab Results  Component Value Date/Time   CHOL 86 (L) 01/27/2016 10:34 AM   TRIG 134 01/27/2016 10:34 AM   HDL 29 (L) 01/27/2016 10:34 AM   CHOLHDL 3.0 01/27/2016 10:34 AM   CHOLHDL 3.6 09/06/2015 09:57 AM    LDLCALC 30 01/27/2016 10:34 AM    Wt Readings from Last 3 Encounters:  01/17/19 172 lb (78 kg)  01/04/19 175 lb (79.4 kg)  12/13/18 175 lb 1.9 oz (79.4 kg)     Objective:    Vital Signs:  Ht 5' 7.5" (1.715 m)   Wt 172 lb (78 kg)   BMI 26.54 kg/m     ASSESSMENT & PLAN:    1.  OSA - The patient is tolerating PAP therapy well without any problems. The PAP download was reviewed today and showed an AHI of 4.9/hr on auto PAP  with 80% compliance in using more than 4 hours nightly.  The patient has been using and benefiting from PAP use and will continue to benefit from therapy. I encouraged her to use nasal saline spray 2 sprays each nostril twice daily and adjust her humidity in her device up some.   2.  HTN -BP controlled -continue Lisinopril 10mg  daily  3.  Type 2 DM -followed by PCP -goal is < 7% -continue metformin 1000mg  BID   COVID-19 Education: The signs and symptoms of COVID-19 were discussed with the patient and how to seek care for testing (follow up with PCP or arrange E-visit).  The importance of social distancing was discussed today.  Patient Risk:   After full review of this patient's clinical status, I feel that they are at least moderate risk at this time.  Time:   Today, I have spent 20 minutes directly with the patient on telemedicine discussing medical problems including OSA, HTN, DM.  We also reviewed the symptoms of COVID 19 and the ways to protect against contracting the virus with telehealth technology.  I spent an additional 5 minutes reviewing patient's chart including PAP compliance download.  Medication Adjustments/Labs and Tests Ordered: Current medicines are reviewed at length with the patient today.  Concerns regarding medicines are outlined above.  Tests Ordered: No orders of the defined types were placed in this encounter.  Medication Changes: No orders of the defined types were placed in this encounter.   Disposition:  Follow up in 1  year(s)  Signed, Fransico Him, MD  01/17/2019 9:46 AM    [facility]

## 2019-01-17 NOTE — Telephone Encounter (Addendum)
Order placed to Taylor [name] via community message. Pt has been encouraged to use nasal saline twice a day. 1 year appointment has been made.

## 2019-02-13 DIAGNOSIS — H1045 Other chronic allergic conjunctivitis: Secondary | ICD-10-CM | POA: Diagnosis not present

## 2019-02-13 DIAGNOSIS — H40013 Open angle with borderline findings, low risk, bilateral: Secondary | ICD-10-CM | POA: Diagnosis not present

## 2019-02-13 DIAGNOSIS — H04123 Dry eye syndrome of bilateral lacrimal glands: Secondary | ICD-10-CM | POA: Diagnosis not present

## 2019-03-15 ENCOUNTER — Ambulatory Visit: Payer: Medicare Other | Admitting: Physician Assistant

## 2019-04-07 ENCOUNTER — Ambulatory Visit: Payer: Medicare Other | Admitting: Physician Assistant

## 2019-04-20 DIAGNOSIS — H04123 Dry eye syndrome of bilateral lacrimal glands: Secondary | ICD-10-CM | POA: Diagnosis not present

## 2019-04-20 DIAGNOSIS — H40013 Open angle with borderline findings, low risk, bilateral: Secondary | ICD-10-CM | POA: Diagnosis not present

## 2019-04-20 DIAGNOSIS — H1045 Other chronic allergic conjunctivitis: Secondary | ICD-10-CM | POA: Diagnosis not present

## 2019-04-20 DIAGNOSIS — E113551 Type 2 diabetes mellitus with stable proliferative diabetic retinopathy, right eye: Secondary | ICD-10-CM | POA: Diagnosis not present

## 2019-04-25 DIAGNOSIS — R5383 Other fatigue: Secondary | ICD-10-CM | POA: Diagnosis not present

## 2019-04-25 DIAGNOSIS — L84 Corns and callosities: Secondary | ICD-10-CM | POA: Diagnosis not present

## 2019-04-25 DIAGNOSIS — F419 Anxiety disorder, unspecified: Secondary | ICD-10-CM | POA: Diagnosis not present

## 2019-04-25 DIAGNOSIS — I739 Peripheral vascular disease, unspecified: Secondary | ICD-10-CM | POA: Diagnosis not present

## 2019-04-25 DIAGNOSIS — E1142 Type 2 diabetes mellitus with diabetic polyneuropathy: Secondary | ICD-10-CM | POA: Diagnosis not present

## 2019-04-26 ENCOUNTER — Encounter: Payer: Self-pay | Admitting: Physician Assistant

## 2019-04-26 ENCOUNTER — Ambulatory Visit (INDEPENDENT_AMBULATORY_CARE_PROVIDER_SITE_OTHER): Payer: Medicare Other | Admitting: Physician Assistant

## 2019-04-26 ENCOUNTER — Other Ambulatory Visit: Payer: Self-pay

## 2019-04-26 VITALS — BP 118/50 | HR 53 | Ht 67.0 in | Wt 173.0 lb

## 2019-04-26 DIAGNOSIS — R5383 Other fatigue: Secondary | ICD-10-CM

## 2019-04-26 DIAGNOSIS — I10 Essential (primary) hypertension: Secondary | ICD-10-CM

## 2019-04-26 DIAGNOSIS — R0602 Shortness of breath: Secondary | ICD-10-CM | POA: Diagnosis not present

## 2019-04-26 DIAGNOSIS — E782 Mixed hyperlipidemia: Secondary | ICD-10-CM | POA: Diagnosis not present

## 2019-04-26 DIAGNOSIS — I2510 Atherosclerotic heart disease of native coronary artery without angina pectoris: Secondary | ICD-10-CM | POA: Diagnosis not present

## 2019-04-26 MED ORDER — LISINOPRIL 5 MG PO TABS: 5.0000 mg | ORAL_TABLET | Freq: Every day | ORAL | 1 refills | Status: DC

## 2019-04-26 NOTE — Patient Instructions (Signed)
Medication Instructions:   Your physician has recommended you make the following change in your medication:   1) Decrease Lisinopril to 5 mg, 1 tablet by mouth once a day  *If you need a refill on your cardiac medications before your next appointment, please call your pharmacy*  Lab Work:  None ordered today  Testing/Procedures:  Your physician has requested that you have an echocardiogram. Echocardiography is a painless test that uses sound waves to create images of your heart. It provides your doctor with information about the size and shape of your heart and how well your heart's chambers and valves are working. This procedure takes approximately one hour. There are no restrictions for this procedure.  Follow-Up: At [facility], you and your health needs are our priority.  As part of our continuing mission to provide you with exceptional heart care, we have created designated Provider Care Teams.  These Care Teams include your primary Cardiologist (physician) and Advanced Practice Providers (APPs -  Physician Assistants and Nurse Practitioners) who all work together to provide you with the care you need, when you need it.  We recommend signing up for the patient portal called "MyChart".  Sign up information is provided on this After Visit Summary.  MyChart is used to connect with patients for Virtual Visits (Telemedicine).  Patients are able to view lab/test results, encounter notes, upcoming appointments, etc.  Non-urgent messages can be sent to your provider as well.   To learn more about what you can do with MyChart, go to [email or web address].    Your next appointment:   3 month(s)  The format for your next appointment:   Virtual Visit   Provider:   Richardson Dopp, PA-C   Other Instructions  Check blood pressure often, call if your blood pressure is consistently over 130/80. Increase your walking gradually.

## 2019-04-26 NOTE — Progress Notes (Signed)
Cardiology Office Note:    Date:  04/26/2019   ID:  Taylor [name], DOB [birth year], MRN [id number]  PCP:  Lawerance Cruel, MD  Cardiologist:  Mertie Moores, MD  Electrophysiologist:  None  PV: Kathlyn Sacramento, MD Sleep Medicine:  Fransico Him, MD   Referring MD: Lawerance Cruel, MD   Chief Complaint:  Follow-up (CAD, dyspnea)    Patient Profile:    Taylor [name] is a 74 y.o. female with:   Coronary artery disease   S/p CABG in 2006  [place] 2/17 >> S-RCA & S-Dx/OM occluded; PCI: DES to RCA  [place] 11/17 >> mRCA stent, L-LAD ok, stable dz in LCx; pRCA 95 >> DES to pRCA  Myoview 12/2018: Low risk  Carotid stenosis  [place] 09/2018: Bilateral ICA 1-39  Peripheral Arterial Disease    bilat popliteal artery occlusions, not ideal for intervention   Hx of pulmonary embolism in 02/2012  Breast CA, s/p L mastectomy in 2011  Diabetes mellitus   Hypertension   Hyperlipidemia   Myalgias w/ Rosuva, Atorva  Prior CV studies: Myoview 01/04/2019 EF 53, normal perfusion, low risk  Carotid US 10/10/2018 bilat ICA 1-39  ABIs 05/27/17 R 0.70; L 0.68 Mod Bilat LE arterial dz   LHC 11/19/15 LM normal LAD ostial 100, distal 95, D1 ostial 90 LCx ostial 35, proximal-mid 65, OM2 40, lateral OM2 90 RCA proximal 95, mid stent patent RPDA filled by collaterals from second septal perforator RPLB1 70 LIMA-LAD normal EF 55-65 PCI: 2.75 x 16 mm Synergy DES to the proximal RCA  Carotid US 9/17 RICA 40-59 >> FU 1 year  Echo 8/14 Mild LVH, EF 60, normal wall motion, trivial MR, mild LAE, normal RVSF  History of Present Illness:    Taylor [name] was last seen by Dr. Acie Fredrickson in 12/2018.  She noted symptoms of dyspnea on exertion.  She was set up for a Myoview and this demonstrated normal perfusion.  She returns for follow up.  She is here alone.  She continues to feel tired.  She saw primary care and had blood work yesterday.  Her hemoglobin and TSH were normal.  She is not sure of her  vitamin D level is obtained.  She does note dyspnea with exertion.  This has been going on for the past year.  It is not getting any worse.  She describes NYHA IIb symptoms.  She does limit her walking due to calf claudication.  She has not had chest discomfort, orthopnea.  She uses CPAP at night.  She has some lower extremity swelling.  Her right leg is usually larger than her left.  She has not had syncope.  She does get dizzy if she stands abruptly.  Past Medical History:  Diagnosis Date  . Cancer of left breast ([place])   . Complication of anesthesia   . Coronary artery disease    a. 2006 s/p CABG x 4 (LIMA->LAD, VG->Diag->OM, VG->RPDA);  b. 02/2015: [place] s/p DES to [facility], VG->PDA & VG->D1->OM 100; c. 06/2015 Cath: diffuse dzs->Med rx; d. 11/2015 Cath/PCI: LM nl, LAD 100ost, 95d, D1 90ost, LCX 35ost, 65p, OM2 40, lat OM2 90, RCA 95p (2.75x16 Synergy DES), patent mid stent, RPLB 70, LIMA->LAD ok.EF55-65%.  . Depression   . Hx of tobacco use, presenting hazards to health    a. quit 2006.  [name] Hypercholesteremia   . Hypertension   . Hypertensive heart disease   . Left carotid bruit    a. 09/2014 Carotid U/S: 1-39% bilat  ICA stenosis.  [name] PAD (peripheral artery disease) ([place])    a. 11/2014 ABI: R: 0.63, L 0.59;  b. 11/2014 Periph Angio: bilat pop occlusions. L - short w/ reconstituion via collats in dist pop w/ 2 vessel runoff, R long w/ reconstitution in prox tib/peroneal arteries-->med Rx w/ pletal.  . Pulmonary embolism ([place]) a. 2014.  [name] Reflux esophagitis   . Type II diabetes mellitus ([place])   1. Depression 2. Breast cancer: s/p left mastectomy in 3/11.  3. PE [id number]: Uncertain etiology, no trigger. Factor V Leiden negative, prothrombin gene mutation negative, but lupus anticoagulant positive. Venous dopplers (8/14) with left greater saphenous vein superficial thrombosis. Repeat antiphospholipid antibody workup negative in 8/14 and again in 1/15.  4. Hyperlipidemia: Myalgias with Crestor and  atorvastatin.  5. Type II diabetes 6. HTN 7. Cholescystectomy 8. GERD 9. CAD: s/p CABG in 5/06 with LIMA-LAD, sequential SVG-D and OM, SVG-PDA. Echo (8/14) with EF 60%, mild LVH, normal RV. Cardiolite (7/16) with small fixed lateral defect, no ischemia, EF 51% =>low risk.  - Unstable angina (2/17): LHC with totally occluded SVG-dRCA and sequential SVG-D/OM, LIMA-LAD patent, 99% mRCA, 75% PDA, totally occluded LAD, 50-75% mLCx =>FFR LCx negative, PCI/DES to mRCA.  - LHC (6/17) with totally occluded ostial LAD, free LIMA-LAD patent, 50-60% mLCX (appeared better than prior), 90% ostial/proximal PLOM (small vessel, not amenable to PCI), 50% mRCA, 50% mPLV, SVG-RCA totally occluded. Medical management.  10. Carotid stenosis: carotids (8/14) with 40-59% bilateral ICA stenosis. Carotid dopplers (9/15) with 1-39% BICA stenosis.  11. Glaucoma 12. PAD: ABIs (10/16) 0.63 R, 0.59 L. Peripheral angiography [name]) 11/16 with bilateral popliteal occlusion =>lesion not optimal for PCI, so cilostazol started (later stopped with need for DAPT).   Current Medications: Current Meds  Medication Sig  . acetaminophen (TYLENOL) 500 MG tablet Take 1,000 mg by mouth every 6 (six) hours as needed for headache (pain).   [name] ALPRAZolam (XANAX) 0.25 MG tablet Take 0.25 mg by mouth daily as needed for anxiety.   [name] aspirin 81 MG tablet Take 81 mg by mouth daily.  . brimonidine-timolol (COMBIGAN) 0.2-0.5 % ophthalmic solution Place 1 drop into both eyes every 12 (twelve) hours.  . clopidogrel (PLAVIX) 75 MG tablet Take 1 tablet by mouth once daily  . isosorbide mononitrate (IMDUR) 60 MG 24 hr tablet Take 1 tablet (60 mg total) by mouth daily.  [name] latanoprost (XALATAN) 0.005 % ophthalmic solution Place 1 drop into both eyes daily.   . metFORMIN (GLUCOPHAGE) 500 MG tablet Take 1,000 mg by mouth 2 (two) times daily with a meal.   . nitroGLYCERIN (NITROSTAT) 0.4 MG SL tablet Place 1 tablet (0.4 mg total) under the tongue  every 5 (five) minutes as needed for chest pain.  . rosuvastatin (CRESTOR) 20 MG tablet TAKE ONE TABLET BY MOUTH ONCE DAILY AT  6PM  . sertraline (ZOLOFT) 100 MG tablet Take 100 mg by mouth daily.   . [DISCONTINUED] lisinopril (PRINIVIL,ZESTRIL) 10 MG tablet Take 1 tablet (10 mg total) by mouth daily.  [name] lisinopril (ZESTRIL) 5 MG tablet Take 1 tablet (5 mg total) by mouth daily.     Allergies:   Niacin and Lipitor [atorvastatin]   Social History   Tobacco Use  . Smoking status: Former Smoker    Packs/day: 0.50    Years: 15.00    Pack years: 7.50    Types: Cigarettes    Quit date: 05/11/2004    Years since quitting: 14.9  . Smokeless tobacco: Never Used  Substance Use Topics  . Alcohol use: No  . Drug use: No     Family Hx: The patient's family history includes Cancer in her sister; Coronary artery disease (age of onset: 59) in her sister; Coronary artery disease (age of onset: 53) in her mother; Deep vein thrombosis in her cousin; Heart attack (age of onset: 57) in her father.  Review of Systems  Constitution: Negative for chills and fever.  Respiratory: Negative for cough and wheezing.   Gastrointestinal: Negative for hematochezia and melena.  Genitourinary: Negative for hematuria.     EKGs/Labs/Other Test Reviewed:    EKG:  EKG is  ordered today.  The ekg ordered today demonstrates sinus bradycardia, HR 53, left axis deviation, right bundle branch block, QTC 424, no change from prior tracing  Recent Labs:   Labs from PCP (obtained via [employer]) personally reviewed and interpreted: 11/01/2018: TC 109, HDL 30, LDL 31, TG 238, SCr 1.17, K 4.6, ALT 10 04/25/2019: A1c 8, Hgb 12, TSH 2.61   Physical Exam:    VS:  BP (!) 118/50   Pulse (!) 53   Ht 5\' 7"  (1.702 m)   Wt 173 lb (78.5 kg)   SpO2 97%   BMI 27.10 kg/m     Wt Readings from Last 3 Encounters:  04/26/19 173 lb (78.5 kg)  01/17/19 172 lb (78 kg)  01/04/19 175 lb (79.4 kg)     Constitutional:       Appearance: Healthy appearance. Not in distress.  Neck:     Vascular: JVD normal.  Pulmonary:     Effort: Pulmonary effort is normal.     Breath sounds: No wheezing. No rales.  Cardiovascular:     Regular rhythm. Normal S1. Normal S2.     Murmurs: There is no murmur.  Edema:    Pretibial: bilateral trace edema of the pretibial area. Abdominal:     Palpations: Abdomen is soft. There is no hepatomegaly.  Skin:    General: Skin is warm and dry.  Neurological:     General: No focal deficit present.     Mental Status: Alert and oriented to person, place and time.      ASSESSMENT & PLAN:    1. Shortness of breath Recent Myoview was low risk.  She does have a history of pulmonary embolism in 2014.  She has not had an echocardiogram since that time.  She also has a prior history of smoking.  I have recommended that we obtain a 2D echocardiogram mainly to assess her pulmonary pressures.  If she has significantly elevated RVSP or reduced RV function, consider referral to pulmonology/CHF clinic for further evaluation of pulmonary hypertension.  She did have a recent CBC which was normal.  2. Other fatigue Her diastolic blood pressure is somewhat low.  Question if this may be contributing to some of her fatigue.  I have asked her to decrease her lisinopril to 5 mg daily.  If her blood pressure starts to increase over 130/80, we will need to resume 10 mg daily.  As noted, she did have blood work with primary care recently.  Continue follow-up with primary care as well.  3. Coronary artery disease involving native coronary artery of native heart without angina pectoris History of CABG in 2006.  Vein graft to the RCA and vein graft to the diagonal/OM are known to be occluded.  She had a DES to the RCA in 02/2015 and DES to the RCA in November 2017.  She  has had moderate disease in the native LCx which was negative by FFR.  LIMA-LAD has been patent.  Recent Myoview was low risk.  She has not been having  anginal symptoms.  Continue current management which includes aspirin, clopidogrel, isosorbide, rosuvastatin.  She is not on beta-blocker due to bradycardia.  4. Essential hypertension Blood pressure running somewhat low.  As noted, her lisinopril will be reduced to see if this helps her symptoms.  5. Mixed hyperlipidemia LDL optimal on most recent lab work.  Continue current Rx.     Dispo:  Return in about 3 months (around 07/26/2019) for Routine Follow Up, w/ Richardson Dopp, PA-C, via Telemedicine.   Medication Adjustments/Labs and Tests Ordered: Current medicines are reviewed at length with the patient today.  Concerns regarding medicines are outlined above.  Tests Ordered: Orders Placed This Encounter  Procedures  . EKG 12-Lead  . ECHOCARDIOGRAM COMPLETE   Medication Changes: Meds ordered this encounter  Medications  . lisinopril (ZESTRIL) 5 MG tablet    Sig: Take 1 tablet (5 mg total) by mouth daily.    Dispense:  90 tablet    Refill:  1    Signed, Richardson Dopp, PA-C  04/26/2019 9:36 AM    [facility] [place], [place], [place]  [address] Phone: [phone]; Fax: [phone]

## 2019-05-10 ENCOUNTER — Encounter: Payer: Self-pay | Admitting: Physician Assistant

## 2019-05-10 ENCOUNTER — Ambulatory Visit (HOSPITAL_COMMUNITY): Payer: Medicare Other | Attending: Cardiovascular Disease

## 2019-05-10 ENCOUNTER — Other Ambulatory Visit: Payer: Self-pay

## 2019-05-10 DIAGNOSIS — R0602 Shortness of breath: Secondary | ICD-10-CM | POA: Diagnosis not present

## 2019-05-10 DIAGNOSIS — I2510 Atherosclerotic heart disease of native coronary artery without angina pectoris: Secondary | ICD-10-CM | POA: Diagnosis not present

## 2019-05-11 ENCOUNTER — Telehealth: Payer: Self-pay

## 2019-05-11 NOTE — Telephone Encounter (Signed)
-----   Message from Liliane Shi, [place] sent at 05/10/2019  5:26 PM EDT ----- Please call the patient. The echocardiogram shows normal heart function.  Right-sided heart function is also normal.  Pulmonary pressures are normal. PLAN:   - Continue current medications/treatment plan and follow up as scheduled.  - Send copy to PCP Richardson Dopp, PA-C    05/10/2019 5:25 PM

## 2019-05-11 NOTE — Telephone Encounter (Signed)
The patient has been notified of the result and verbalized understanding.  All questions (if any) were answered. Wilma Flavin, RN 05/11/2019 8:31 AM

## 2019-06-08 DIAGNOSIS — Z23 Encounter for immunization: Secondary | ICD-10-CM | POA: Diagnosis not present

## 2019-08-02 DIAGNOSIS — H40013 Open angle with borderline findings, low risk, bilateral: Secondary | ICD-10-CM | POA: Diagnosis not present

## 2019-08-08 ENCOUNTER — Other Ambulatory Visit: Payer: Self-pay

## 2019-08-08 ENCOUNTER — Encounter (INDEPENDENT_AMBULATORY_CARE_PROVIDER_SITE_OTHER): Payer: Medicare Other | Admitting: Ophthalmology

## 2019-08-08 DIAGNOSIS — H338 Other retinal detachments: Secondary | ICD-10-CM | POA: Diagnosis not present

## 2019-08-08 DIAGNOSIS — I10 Essential (primary) hypertension: Secondary | ICD-10-CM | POA: Diagnosis not present

## 2019-08-08 DIAGNOSIS — D3132 Benign neoplasm of left choroid: Secondary | ICD-10-CM | POA: Diagnosis not present

## 2019-08-08 DIAGNOSIS — H43813 Vitreous degeneration, bilateral: Secondary | ICD-10-CM | POA: Diagnosis not present

## 2019-08-08 DIAGNOSIS — E113591 Type 2 diabetes mellitus with proliferative diabetic retinopathy without macular edema, right eye: Secondary | ICD-10-CM | POA: Diagnosis not present

## 2019-08-08 DIAGNOSIS — H35033 Hypertensive retinopathy, bilateral: Secondary | ICD-10-CM

## 2019-08-08 DIAGNOSIS — E113292 Type 2 diabetes mellitus with mild nonproliferative diabetic retinopathy without macular edema, left eye: Secondary | ICD-10-CM | POA: Diagnosis not present

## 2019-08-08 DIAGNOSIS — E11319 Type 2 diabetes mellitus with unspecified diabetic retinopathy without macular edema: Secondary | ICD-10-CM | POA: Diagnosis not present

## 2019-08-16 ENCOUNTER — Other Ambulatory Visit: Payer: Self-pay | Admitting: Family Medicine

## 2019-08-16 DIAGNOSIS — H40013 Open angle with borderline findings, low risk, bilateral: Secondary | ICD-10-CM | POA: Diagnosis not present

## 2019-08-16 DIAGNOSIS — Z1231 Encounter for screening mammogram for malignant neoplasm of breast: Secondary | ICD-10-CM

## 2019-08-23 ENCOUNTER — Other Ambulatory Visit: Payer: Self-pay

## 2019-08-23 ENCOUNTER — Ambulatory Visit (INDEPENDENT_AMBULATORY_CARE_PROVIDER_SITE_OTHER): Payer: Medicare Other | Admitting: Physician Assistant

## 2019-08-23 ENCOUNTER — Encounter: Payer: Self-pay | Admitting: Physician Assistant

## 2019-08-23 VITALS — BP 120/50 | HR 70 | Ht 67.0 in | Wt 174.0 lb

## 2019-08-23 DIAGNOSIS — I10 Essential (primary) hypertension: Secondary | ICD-10-CM | POA: Diagnosis not present

## 2019-08-23 DIAGNOSIS — E782 Mixed hyperlipidemia: Secondary | ICD-10-CM | POA: Diagnosis not present

## 2019-08-23 DIAGNOSIS — E119 Type 2 diabetes mellitus without complications: Secondary | ICD-10-CM

## 2019-08-23 DIAGNOSIS — I739 Peripheral vascular disease, unspecified: Secondary | ICD-10-CM

## 2019-08-23 DIAGNOSIS — I25119 Atherosclerotic heart disease of native coronary artery with unspecified angina pectoris: Secondary | ICD-10-CM

## 2019-08-23 MED ORDER — ISOSORBIDE MONONITRATE ER 60 MG PO TB24: 90.0000 mg | ORAL_TABLET | Freq: Every day | ORAL | 3 refills | Status: DC

## 2019-08-23 MED ORDER — LISINOPRIL 5 MG PO TABS: 5.0000 mg | ORAL_TABLET | Freq: Every day | ORAL | 3 refills | Status: DC

## 2019-08-23 NOTE — Progress Notes (Signed)
Cardiology Office Note:    Date:  08/23/2019   ID:  Taylor [name], DOB 11/16/1945, MRN [id number]  PCP:  Lawerance Cruel, MD  Cardiologist:  Mertie Moores, MD   Electrophysiologist:  None  PV: Kathlyn Sacramento, MD Sleep Medicine:  Fransico Him, MD   Referring MD: Lawerance Cruel, MD   Chief Complaint:  Follow-up (CAD)    Patient Profile:    Taylor [name] is a 74 y.o. female with:   Coronary artery disease  ? S/p CABG in 2006 ? [place] 2/17 >> S-RCA & S-Dx/OM occluded; PCI: DES to RCA ? [place] 11/17 >> mRCA stent, L-LAD ok, stable dz in LCx; pRCA 95 >> DES to pRCA ? Myoview 12/2018: Low risk  Carotid stenosis ? [place] 09/2018: Bilateral ICA 1-39  Peripheral Arterial Disease   ? bilat popliteal artery occlusions, not ideal for intervention   Hx of pulmonary embolism in 02/2012  Breast CA, s/p L mastectomy in 2011  Diabetes mellitus   Hypertension   Hyperlipidemia  ? Myalgias w/ Rosuva, Atorva  Prior CV studies: Echocardiogram 05/10/2019  Septal and basal HK, EF 55, normal diastolic function, normal RV SF, mild BAE, trivial MR, RVSP 29.2 mmHg  Myoview 01/04/2019 EF 53, normal perfusion, low risk  Carotid US 10/10/2018 bilat ICA 1-39  ABIs 05/27/17 R 0.70; L 0.68 Mod Bilat LE arterial dz   LHC 11/19/15 LM normal LAD ostial 100, distal 95, D1 ostial 90 LCx ostial 35, proximal-mid 65, OM2 40, lateral OM2 90 RCA proximal 95, mid stent patent RPDA filled by collaterals from second septal perforator [id number] LIMA-LAD normal EF 55-65 PCI: 2.75 x 16 mm Synergy DES to the proximal RCA  Carotid US 9/17 RICA 40-59>> FU 1 year  Echo 8/14 Mild LVH, EF 60, normal wall motion, trivial MR, mild LAE, normal RVSF  History of Present Illness:    Ms. [name] was last seen in clinic in April 2021.  She continued to have symptoms of shortness of breath.  An echocardiogram demonstrated normal LV function, normal diastolic function and normal RVSP.  She returns for  follow-up.   She is here alone.  She continues to have shortness of breath with some activities.  This is overall stable.  She has an occasional chest pain but no exertional chest discomfort.  She has not taken any nitroglycerin.  She has not had syncope, orthopnea, significant leg swelling.  Past Medical History:  Diagnosis Date  . Cancer of left breast ([place])   . Complication of anesthesia   . Coronary artery disease    a. 2006 s/p CABG x 4 (LIMA->LAD, VG->Diag->OM, VG->RPDA);  b. 02/2015: [place] s/p DES to [facility], VG->PDA & VG->D1->OM 100; c. 06/2015 Cath: diffuse dzs->Med rx; d. 11/2015 Cath/PCI: LM nl, LAD 100ost, 95d, D1 90ost, LCX 35ost, 65p, OM2 40, lat OM2 90, RCA 95p (2.75x16 Synergy DES), patent mid stent, RPLB 70, LIMA->LAD ok.EF55-65%.  . Depression   . Hx of tobacco use, presenting hazards to health    a. quit 2006.  [name] Hypercholesteremia   . Hypertension   . Hypertensive heart disease    Echo 04/2019: Inferior HK, EF 55, normal RV SF, RVSP normal at 29.2, mild BAE, trivial MR, mild aortic valve sclerosis without stenosis  . Left carotid bruit    a. 09/2014 Carotid U/S: 1-39% bilat ICA stenosis.  [name] PAD (peripheral artery disease) ([place])    a. 11/2014 ABI: R: 0.63, L 0.59;  b. 11/2014 Periph Angio: bilat pop occlusions. L -  short w/ reconstituion via collats in dist pop w/ 2 vessel runoff, R long w/ reconstitution in prox tib/peroneal arteries-->med Rx w/ pletal.  . Pulmonary embolism ([place]) a. 2014.  [name] Reflux esophagitis   . Type II diabetes mellitus ([place])   1. Depression 2. Breast cancer: s/p left mastectomy in 3/11.  3. PE 4/09: Uncertain etiology, no trigger. Factor V Leiden negative, prothrombin gene mutation negative, but lupus anticoagulant positive. Venous dopplers (8/14) with left greater saphenous vein superficial thrombosis. Repeat antiphospholipid antibody workup negative in 8/14 and again in 1/15.  4. Hyperlipidemia: Myalgias with Crestor and atorvastatin.  5. Type II  diabetes 6. HTN 7. Cholescystectomy 8. GERD 9. CAD: s/p CABG in 5/06 with LIMA-LAD, sequential SVG-D and OM, SVG-PDA. Echo (8/14) with EF 60%, mild LVH, normal RV. Cardiolite (7/16) with small fixed lateral defect, no ischemia, EF 51% =>low risk.  - Unstable angina (2/17): LHC with totally occluded SVG-dRCA and sequential SVG-D/OM, LIMA-LAD patent, 99% mRCA, 75% PDA, totally occluded LAD, 50-75% mLCx =>FFR LCx negative, PCI/DES to mRCA.  - LHC (6/17) with totally occluded ostial LAD, free LIMA-LAD patent, 50-60% mLCX (appeared better than prior), 90% ostial/proximal PLOM (small vessel, not amenable to PCI), 50% mRCA, 50% mPLV, SVG-RCA totally occluded. Medical management.  10. Carotid stenosis: carotids (8/14) with 40-59% bilateral ICA stenosis. Carotid dopplers (9/15) with 1-39% BICA stenosis.  11. Glaucoma 12. PAD: ABIs (10/16) 0.63 R, 0.59 L. Peripheral angiography [name]) 11/16 with bilateral popliteal occlusion =>lesion not optimal for PCI, so cilostazol started (later stopped with need for DAPT).  Current Medications: Current Meds  Medication Sig  . acetaminophen (TYLENOL) 500 MG tablet Take 1,000 mg by mouth every 6 (six) hours as needed for headache (pain).   [name] ALPRAZolam (XANAX) 0.25 MG tablet Take 0.25 mg by mouth daily as needed for anxiety.   [name] aspirin 81 MG tablet Take 81 mg by mouth daily.  . clopidogrel (PLAVIX) 75 MG tablet Take 1 tablet by mouth once daily  . isosorbide mononitrate (IMDUR) 60 MG 24 hr tablet Take 1.5 tablets (90 mg total) by mouth daily.  [name] lisinopril (ZESTRIL) 5 MG tablet Take 1 tablet (5 mg total) by mouth daily.  . metFORMIN (GLUCOPHAGE) 500 MG tablet Take 1,000 mg by mouth 2 (two) times daily with a meal.   . nitroGLYCERIN (NITROSTAT) 0.4 MG SL tablet Place 1 tablet (0.4 mg total) under the tongue every 5 (five) minutes as needed for chest pain.  . rosuvastatin (CRESTOR) 20 MG tablet TAKE ONE TABLET BY MOUTH ONCE DAILY AT  6PM  . sertraline  (ZOLOFT) 100 MG tablet Take 100 mg by mouth daily.   [name] VITAMIN D, ERGOCALCIFEROL, PO Take 1 tablet by mouth daily. (OTC)  . [DISCONTINUED] isosorbide mononitrate (IMDUR) 60 MG 24 hr tablet Take 1 tablet (60 mg total) by mouth daily.     Allergies:   Niacin and Lipitor [atorvastatin]   Social History   Tobacco Use  . Smoking status: Former Smoker    Packs/day: 0.50    Years: 15.00    Pack years: 7.50    Types: Cigarettes    Quit date: 05/11/2004    Years since quitting: 15.2  . Smokeless tobacco: Never Used  Vaping Use  . Vaping Use: Never used  Substance Use Topics  . Alcohol use: No  . Drug use: No     Family Hx: The patient's family history includes Cancer in her sister; Coronary artery disease (age of onset: 39) in her sister;  Coronary artery disease (age of onset: 71) in her mother; Deep vein thrombosis in her cousin; Heart attack (age of onset: 8) in her father.  Review of Systems  Cardiovascular: Positive for claudication (stable).  Respiratory: Negative for cough and wheezing.      EKGs/Labs/Other Test Reviewed:    EKG:  EKG is   ordered today.  The ekg ordered today demonstrates normal sinus rhythm, heart rate 70, left axis deviation, incomplete right bundle branch block, QTC 449, no change from prior tracing  Recent Labs: No results found for requested labs within last 8760 hours.   Recent Lipid Panel Lab Results  Component Value Date/Time   CHOL 86 (L) 01/27/2016 10:34 AM   TRIG 134 01/27/2016 10:34 AM   HDL 29 (L) 01/27/2016 10:34 AM   CHOLHDL 3.0 01/27/2016 10:34 AM   CHOLHDL 3.6 09/06/2015 09:57 AM   LDLCALC 30 01/27/2016 10:34 AM    Physical Exam:    VS:  BP (!) 120/50   Pulse 70   Ht 5\' 7"  (1.702 m)   Wt 174 lb (78.9 kg)   SpO2 93%   BMI 27.25 kg/m     Wt Readings from Last 3 Encounters:  08/23/19 174 lb (78.9 kg)  04/26/19 173 lb (78.5 kg)  01/17/19 172 lb (78 kg)     Constitutional:      Appearance: Healthy appearance. Not in  distress.  Neck:     Vascular: JVD normal.  Pulmonary:     Effort: Pulmonary effort is normal.     Breath sounds: No wheezing. No rales.  Cardiovascular:     Normal rate. Regular rhythm. Normal S1. Normal S2.     Murmurs: There is no murmur.  Edema:    Peripheral edema present. Abdominal:     Palpations: Abdomen is soft.  Skin:    General: Skin is warm and dry.  Neurological:     Mental Status: Alert and oriented to person, place and time.     Cranial Nerves: Cranial nerves are intact.       ASSESSMENT & PLAN:    1. Coronary artery disease involving native coronary artery of native heart with angina pectoris ([place]) History of CABG in 2006.  Vein graft to the RCA and vein graft to the diagonal/OM are known to be occluded.  She had a DES to the RCA in 02/2015 and DES to the RCA in November 2017.  She has had moderate disease in the native LCx which was negative by FFR.  LIMA-LAD has been patent.   Myoview in December 2020 was low risk.  Question if her shortness of breath with exertion could be an anginal equivalent.  We discussed increasing her long-acting nitrates to see if this would improve her symptoms.  However, overall her symptoms are stable.  Therefore, no further testing is warranted at this time.  She does note significant fatigue.  Question if deconditioning is also playing a role.  -Continue aspirin, clopidogrel, rosuvastatin  -Increase isosorbide mononitrate to 90 mg daily  -Recommend walking exercise program  -Follow-up 6 months  2. Essential hypertension The patient's blood pressure is controlled on her current regimen.  Continue current therapy.   3. Mixed hyperlipidemia Recent LDL optimal.  Triglycerides above goal.  Continue to work on diet and exercise.  If triglycerides remain above 150, consider Vascepa.  4. PAD (peripheral artery disease) ([place]) She has stable bilateral calf claudication.  Continue current therapy.  Recommend exercise program.  5.  Diabetes  mellitus  Recent hemoglobin A1c 8.  She is followed by primary care.  If sugars remain elevated, consideration could be given to starting empagliflozin given CV benefit.    Dispo:  Return in about 6 months (around 02/23/2020) for Routine Follow Up, w/ Dr. Acie Fredrickson, in person.   Medication Adjustments/Labs and Tests Ordered: Current medicines are reviewed at length with the patient today.  Concerns regarding medicines are outlined above.  Tests Ordered: Orders Placed This Encounter  Procedures  . EKG 12-Lead   Medication Changes: Meds ordered this encounter  Medications  . isosorbide mononitrate (IMDUR) 60 MG 24 hr tablet    Sig: Take 1.5 tablets (90 mg total) by mouth daily.    Dispense:  135 tablet    Refill:  3    Signed, Richardson Dopp, PA-C  08/23/2019 3:42 PM    [facility] [place], [place], [place]  [address] Phone: [phone]; Fax: [phone]

## 2019-08-23 NOTE — Patient Instructions (Signed)
Medication Instructions:  Your physician has recommended you make the following change in your medication:   1) Increase Imdur to 90 mg, 1.5 tablets by mouth once a day  Lab Work: None ordered today  Testing/Procedures: None ordered today  Follow-Up: At [facility], you and your health needs are our priority.  As part of our continuing mission to provide you with exceptional heart care, we have created designated Provider Care Teams.  These Care Teams include your primary Cardiologist (physician) and Advanced Practice Providers (APPs -  Physician Assistants and Nurse Practitioners) who all work together to provide you with the care you need, when you need it.  We recommend signing up for the patient portal called "MyChart".  Sign up information is provided on this After Visit Summary.  MyChart is used to connect with patients for Virtual Visits (Telemedicine).  Patients are able to view lab/test results, encounter notes, upcoming appointments, etc.  Non-urgent messages can be sent to your provider as well.   To learn more about what you can do with MyChart, go to [email or web address].    Your next appointment:   6 month(s)  The format for your next appointment:   In Person  Provider:   Mertie Moores, MD

## 2019-09-08 ENCOUNTER — Other Ambulatory Visit: Payer: Self-pay

## 2019-09-08 ENCOUNTER — Ambulatory Visit
Admission: RE | Admit: 2019-09-08 | Discharge: 2019-09-08 | Disposition: A | Discharge status: Home / Self Care | Payer: Medicare Other | Source: Ambulatory Visit | Attending: Family Medicine | Admitting: Family Medicine

## 2019-09-08 DIAGNOSIS — Z1231 Encounter for screening mammogram for malignant neoplasm of breast: Secondary | ICD-10-CM | POA: Diagnosis not present

## 2019-09-28 ENCOUNTER — Other Ambulatory Visit: Payer: Self-pay | Admitting: Physician Assistant

## 2019-09-28 DIAGNOSIS — I10 Essential (primary) hypertension: Secondary | ICD-10-CM

## 2019-10-02 DIAGNOSIS — H40013 Open angle with borderline findings, low risk, bilateral: Secondary | ICD-10-CM | POA: Diagnosis not present

## 2019-10-10 ENCOUNTER — Ambulatory Visit: Payer: Medicare Other | Admitting: Cardiovascular Disease

## 2019-10-26 DIAGNOSIS — Z23 Encounter for immunization: Secondary | ICD-10-CM | POA: Diagnosis not present

## 2019-10-26 DIAGNOSIS — H6980 Other specified disorders of Eustachian tube, unspecified ear: Secondary | ICD-10-CM | POA: Diagnosis not present

## 2019-10-26 DIAGNOSIS — R42 Dizziness and giddiness: Secondary | ICD-10-CM | POA: Diagnosis not present

## 2019-10-31 ENCOUNTER — Ambulatory Visit: Payer: Medicare Other | Admitting: Cardiovascular Disease

## 2019-11-21 ENCOUNTER — Ambulatory Visit: Payer: Medicare Other | Admitting: Cardiovascular Disease

## 2019-12-26 DIAGNOSIS — C4361 Malignant melanoma of right upper limb, including shoulder: Secondary | ICD-10-CM | POA: Diagnosis not present

## 2020-01-01 ENCOUNTER — Other Ambulatory Visit: Payer: Self-pay | Admitting: Cardiovascular Disease

## 2020-01-09 DIAGNOSIS — D485 Neoplasm of uncertain behavior of skin: Secondary | ICD-10-CM | POA: Diagnosis not present

## 2020-01-09 DIAGNOSIS — C4361 Malignant melanoma of right upper limb, including shoulder: Secondary | ICD-10-CM | POA: Diagnosis not present

## 2020-01-18 DIAGNOSIS — C4361 Malignant melanoma of right upper limb, including shoulder: Secondary | ICD-10-CM | POA: Diagnosis not present

## 2020-01-25 ENCOUNTER — Telehealth: Payer: Self-pay | Admitting: Cardiovascular Disease

## 2020-01-25 NOTE — Telephone Encounter (Signed)
Patient called to schedule annual appts with Dr. Radford Pax and Dr. Acie Fredrickson. She wanted to let them know that she has malignant melanoma and will be having it removed on 02/06/20 in [place].

## 2020-02-01 DIAGNOSIS — I10 Essential (primary) hypertension: Secondary | ICD-10-CM | POA: Diagnosis not present

## 2020-02-01 DIAGNOSIS — E119 Type 2 diabetes mellitus without complications: Secondary | ICD-10-CM | POA: Diagnosis not present

## 2020-02-01 DIAGNOSIS — I2510 Atherosclerotic heart disease of native coronary artery without angina pectoris: Secondary | ICD-10-CM | POA: Diagnosis not present

## 2020-02-01 DIAGNOSIS — Z951 Presence of aortocoronary bypass graft: Secondary | ICD-10-CM | POA: Diagnosis not present

## 2020-02-06 ENCOUNTER — Ambulatory Visit: Payer: Medicare Other | Admitting: Cardiovascular Disease

## 2020-02-06 DIAGNOSIS — E119 Type 2 diabetes mellitus without complications: Secondary | ICD-10-CM | POA: Diagnosis not present

## 2020-02-06 DIAGNOSIS — C4371 Malignant melanoma of right lower limb, including hip: Secondary | ICD-10-CM | POA: Diagnosis not present

## 2020-02-06 DIAGNOSIS — I10 Essential (primary) hypertension: Secondary | ICD-10-CM | POA: Diagnosis not present

## 2020-02-06 DIAGNOSIS — D225 Melanocytic nevi of trunk: Secondary | ICD-10-CM | POA: Diagnosis not present

## 2020-02-06 DIAGNOSIS — L821 Other seborrheic keratosis: Secondary | ICD-10-CM | POA: Diagnosis not present

## 2020-02-06 DIAGNOSIS — Z85828 Personal history of other malignant neoplasm of skin: Secondary | ICD-10-CM | POA: Diagnosis not present

## 2020-02-06 DIAGNOSIS — Z951 Presence of aortocoronary bypass graft: Secondary | ICD-10-CM | POA: Diagnosis not present

## 2020-02-06 DIAGNOSIS — C4361 Malignant melanoma of right upper limb, including shoulder: Secondary | ICD-10-CM | POA: Diagnosis not present

## 2020-02-06 DIAGNOSIS — Z955 Presence of coronary angioplasty implant and graft: Secondary | ICD-10-CM | POA: Diagnosis not present

## 2020-02-06 DIAGNOSIS — Z7982 Long term (current) use of aspirin: Secondary | ICD-10-CM | POA: Diagnosis not present

## 2020-02-06 DIAGNOSIS — Z7984 Long term (current) use of oral hypoglycemic drugs: Secondary | ICD-10-CM | POA: Diagnosis not present

## 2020-02-06 DIAGNOSIS — Z9049 Acquired absence of other specified parts of digestive tract: Secondary | ICD-10-CM | POA: Diagnosis not present

## 2020-02-06 DIAGNOSIS — D0461 Carcinoma in situ of skin of right upper limb, including shoulder: Secondary | ICD-10-CM | POA: Diagnosis not present

## 2020-02-06 DIAGNOSIS — D0361 Melanoma in situ of right upper limb, including shoulder: Secondary | ICD-10-CM | POA: Diagnosis not present

## 2020-02-06 DIAGNOSIS — Z9012 Acquired absence of left breast and nipple: Secondary | ICD-10-CM | POA: Diagnosis not present

## 2020-02-06 DIAGNOSIS — I2510 Atherosclerotic heart disease of native coronary artery without angina pectoris: Secondary | ICD-10-CM | POA: Diagnosis not present

## 2020-02-06 DIAGNOSIS — Z79899 Other long term (current) drug therapy: Secondary | ICD-10-CM | POA: Diagnosis not present

## 2020-02-06 DIAGNOSIS — Z7902 Long term (current) use of antithrombotics/antiplatelets: Secondary | ICD-10-CM | POA: Diagnosis not present

## 2020-02-16 DIAGNOSIS — C4361 Malignant melanoma of right upper limb, including shoulder: Secondary | ICD-10-CM | POA: Diagnosis not present

## 2020-02-16 DIAGNOSIS — Z09 Encounter for follow-up examination after completed treatment for conditions other than malignant neoplasm: Secondary | ICD-10-CM | POA: Diagnosis not present

## 2020-02-19 ENCOUNTER — Encounter: Payer: Self-pay | Admitting: Cardiology

## 2020-02-19 ENCOUNTER — Other Ambulatory Visit: Payer: Self-pay

## 2020-02-19 ENCOUNTER — Telehealth (INDEPENDENT_AMBULATORY_CARE_PROVIDER_SITE_OTHER): Payer: Medicare Other | Admitting: Cardiology

## 2020-02-19 VITALS — Ht 67.0 in | Wt 173.0 lb

## 2020-02-19 DIAGNOSIS — E119 Type 2 diabetes mellitus without complications: Secondary | ICD-10-CM

## 2020-02-19 DIAGNOSIS — G4733 Obstructive sleep apnea (adult) (pediatric): Secondary | ICD-10-CM | POA: Diagnosis not present

## 2020-02-19 DIAGNOSIS — I10 Essential (primary) hypertension: Secondary | ICD-10-CM | POA: Diagnosis not present

## 2020-02-19 NOTE — Progress Notes (Signed)
Virtual Visit via Video Note   This visit type was conducted due to national recommendations for restrictions regarding the COVID-19 Pandemic (e.g. social distancing) in an effort to limit this patient's exposure and mitigate transmission in our community.  Due to her co-morbid illnesses, this patient is at least at moderate risk for complications without adequate follow up.  This format is felt to be most appropriate for this patient at this time.  The patient did not have access to video technology/had technical difficulties with video requiring transitioning to audio format only (telephone).  All issues noted in this document were discussed and addressed.  No physical exam could be performed with this format.  Please refer to the patient's chart for her  consent to telehealth for [facility].   Evaluation Performed:  Follow-up visit  This visit type was conducted due to national recommendations for restrictions regarding the COVID-19 Pandemic (e.g. social distancing).  This format is felt to be most appropriate for this patient at this time.  All issues noted in this document were discussed and addressed.  No physical exam was performed (except for noted visual exam findings with Video Visits).  Please refer to the patient's chart (MyChart message for video visits and phone note for telephone visits) for the patient's consent to telehealth for [facility].  Date:  02/19/2020   ID:  Taylor [name], DOB 1945-11-28, MRN [id number]  Patient Location:  Home  Provider location:   [place]  PCP:  Lawerance Cruel, MD  Cardiologist: Grayland Jack, MD Sleep Medicine:  Fransico Him, MD Electrophysiologist:  None   Chief Complaint:  OSA  History of Present Illness:    Taylor [name] is a 75 y.o. female who presents via audio/video conferencing for a telehealth visit today.    Taylor [name] is a 75y.o. female with a hx of moderate OSA with an AHI of 16.3/hr, loud snoring and oxygen  desaturations as low as 83%. She underwent PAP titration but could not be adequately titrated onPAPand is now on auto BIPAP. She is doing well with her PAP device and thinks that she has gotten used to it.  She tolerates the mask and feels the pressure is adequate.  She does not really feel rested when she gets up in the am. She goes to bed at 8pm and falls asleep fairly quickly and then gets up around 7am.   She has a lot of mouth dryness which has improved with adjustments in her humidity. She does not think that he snores.    The patient does not have symptoms concerning for COVID-19 infection (fever, chills, cough, or new shortness of breath).   Prior CV studies:   The following studies were reviewed today:  PAP compliance download  Past Medical History:  Diagnosis Date  . Cancer of left breast ([place])   . Complication of anesthesia   . Coronary artery disease    a. 2006 s/p CABG x 4 (LIMA->LAD, VG->Diag->OM, VG->RPDA);  b. 02/2015: [place] s/p DES to [facility], VG->PDA & VG->D1->OM 100; c. 06/2015 Cath: diffuse dzs->Med rx; d. 11/2015 Cath/PCI: LM nl, LAD 100ost, 95d, D1 90ost, LCX 35ost, 65p, OM2 40, lat OM2 90, RCA 95p (2.75x16 Synergy DES), patent mid stent, RPLB 70, LIMA->LAD ok.EF55-65%.  . Depression   . Hx of tobacco use, presenting hazards to health    a. quit 2006.  [name] Hypercholesteremia   . Hypertension   . Hypertensive heart disease    Echo 04/2019: Inferior HK,  EF 55, normal RV SF, RVSP normal at 29.2, mild BAE, trivial MR, mild aortic valve sclerosis without stenosis  . Left carotid bruit    a. 09/2014 Carotid U/S: 1-39% bilat ICA stenosis.  [name] PAD (peripheral artery disease) ([place])    a. 11/2014 ABI: R: 0.63, L 0.59;  b. 11/2014 Periph Angio: bilat pop occlusions. L - short w/ reconstituion via collats in dist pop w/ 2 vessel runoff, R long w/ reconstitution in prox tib/peroneal arteries-->med Rx w/ pletal.  . Pulmonary embolism ([place]) a. 2014.  [name] Reflux esophagitis   . Type II  diabetes mellitus ([place])    Past Surgical History:  Procedure Laterality Date  . BREAST BIOPSY Left ~ 2012  . BUNIONECTOMY Left 1970s  . CARDIAC CATHETERIZATION  04/2004  . CARDIAC CATHETERIZATION N/A 02/21/2015   Procedure: Left Heart Cath and Cors/Grafts Angiography;  Surgeon: Belva Crome, MD;  Location: [facility];  Service: Cardiovascular;  Laterality: N/A;  . CARDIAC CATHETERIZATION N/A 02/21/2015   Procedure: Coronary Stent Intervention;  Surgeon: Belva Crome, MD;  Location: [facility];  Service: Cardiovascular;  Laterality: N/A;  . CARDIAC CATHETERIZATION N/A 02/21/2015   Procedure: Intravascular Pressure Wire/FFR Study;  Surgeon: Belva Crome, MD;  Location: [facility];  Service: Cardiovascular;  Laterality: N/A;  . CARDIAC CATHETERIZATION N/A 06/20/2015   Procedure: Left Heart Cath and Cors/Grafts Angiography;  Surgeon: Larey Dresser, MD;  Location: [facility];  Service: Cardiovascular;  Laterality: N/A;  . CARDIAC CATHETERIZATION N/A 11/19/2015   Procedure: Left Heart Cath and Cors/Grafts Angiography;  Surgeon: Peter M Martinique, MD;  Location: [facility];  Service: Cardiovascular;  Laterality: N/A;  . CARDIAC CATHETERIZATION N/A 11/19/2015   Procedure: Coronary Stent Intervention;  Surgeon: Peter M Martinique, MD;  Location: [facility];  Service: Cardiovascular;  Laterality: N/A;  . CATARACT EXTRACTION W/ INTRAOCULAR LENS  IMPLANT, BILATERAL Bilateral 1980s-1990s  . CORONARY ANGIOPLASTY WITH STENT PLACEMENT  02/21/2015   "1 stent"  . CORONARY ARTERY BYPASS GRAFT  05/2004   "CABG X4"  . EYE SURGERY    . LAPAROSCOPIC CHOLECYSTECTOMY  1990s  . MASTECTOMY Left ~ 2012  . PERIPHERAL VASCULAR CATHETERIZATION N/A 12/12/2014   Procedure: Abdominal Aortogram;  Surgeon: Wellington Hampshire, MD;  Location: [facility];  Service: Cardiovascular;  Laterality: N/A;  . RETINAL DETACHMENT SURGERY Right 1990s     Current Meds  Medication Sig  .  acetaminophen (TYLENOL) 500 MG tablet Take 1,000 mg by mouth every 6 (six) hours as needed for headache (pain).   [name] ALPRAZolam (XANAX) 0.25 MG tablet Take 0.25 mg by mouth daily as needed for anxiety.   [name] aspirin 81 MG tablet Take 81 mg by mouth daily.  . clopidogrel (PLAVIX) 75 MG tablet Take 1 tablet by mouth once daily  . isosorbide mononitrate (IMDUR) 60 MG 24 hr tablet Take 1.5 tablets (90 mg total) by mouth daily.  [name] lisinopril (ZESTRIL) 5 MG tablet Take 1 tablet by mouth once daily  . metFORMIN (GLUCOPHAGE) 500 MG tablet Take 1,000 mg by mouth 2 (two) times daily with a meal.   . nitroGLYCERIN (NITROSTAT) 0.4 MG SL tablet Place 1 tablet (0.4 mg total) under the tongue every 5 (five) minutes as needed for chest pain.  . rosuvastatin (CRESTOR) 20 MG tablet TAKE ONE TABLET BY MOUTH ONCE DAILY AT  6PM  . sertraline (ZOLOFT) 100 MG tablet Take 100 mg by mouth daily.   [name]  timolol (BETIMOL) 0.25 % ophthalmic solution 1-2 drops 2 (two) times daily.     Allergies:   Niacin and Lipitor [atorvastatin]   Social History   Tobacco Use  . Smoking status: Former Smoker    Packs/day: 0.50    Years: 15.00    Pack years: 7.50    Types: Cigarettes    Quit date: 05/11/2004    Years since quitting: 15.7  . Smokeless tobacco: Never Used  Vaping Use  . Vaping Use: Never used  Substance Use Topics  . Alcohol use: No  . Drug use: No     Family Hx: The patient's family history includes Cancer in her sister; Coronary artery disease (age of onset: 48) in her sister; Coronary artery disease (age of onset: 59) in her mother; Deep vein thrombosis in her cousin; Heart attack (age of onset: 49) in her father.  ROS:   Please see the history of present illness.     All other systems reviewed and are negative.   Labs/Other Tests and Data Reviewed:    Recent Labs: No results found for requested labs within last 8760 hours.   Recent Lipid Panel Lab Results  Component Value Date/Time   CHOL 86 (L)  01/27/2016 10:34 AM   TRIG 134 01/27/2016 10:34 AM   HDL 29 (L) 01/27/2016 10:34 AM   CHOLHDL 3.0 01/27/2016 10:34 AM   CHOLHDL 3.6 09/06/2015 09:57 AM   LDLCALC 30 01/27/2016 10:34 AM    Wt Readings from Last 3 Encounters:  02/19/20 173 lb (78.5 kg)  08/23/19 174 lb (78.9 kg)  04/26/19 173 lb (78.5 kg)     Objective:    Vital Signs:  Ht 5\' 7"  (1.702 m)   Wt 173 lb (78.5 kg)   BMI 27.10 kg/m    Well nourished, well developed female in no acute distress. Well appearing, alert and conversant, regular work of breathing,  good skin color  Eyes- anicteric mouth- oral mucosa is pink  neuro- grossly intact skin- no apparent rash or lesions or cyanosis   ASSESSMENT & PLAN:    1.  OSA -  The patient is tolerating PAP therapy well without any problems. The PAP download was reviewed today and showed an AHI of 6.7/hr on auto BiPAP cm H2O with 83% compliance in using more than 4 hours nightly.  The patient has been using and benefiting from PAP use and will continue to benefit from therapy.  -she recently was on oxycodone after melanoma surgery and noticed that her apneas went up>>she is off of the meds now and her AHI has improved on her nightly record -she has just changed out all her equipment so I will get another download in 2 weeks to see what her AHI is  2.  HTN -continue Lisinopril 5mg  daily and Imdur 90mg  daily  3.  Type 2 DM -followed by PCP -goal is < 7% -continue metformin 1000mg  BID   COVID-19 Education: The signs and symptoms of COVID-19 were discussed with the patient and how to seek care for testing (follow up with PCP or arrange E-visit).  The importance of social distancing was discussed today.  Patient Risk:   After full review of this patient's clinical status, I feel that they are at least moderate risk at this time.  Time:   Today, I have spent 20 minutes  on telemedicine discussing medical problems including OSA, HTN, DM and reviewing  PAP compliance  download.  Medication Adjustments/Labs and Tests Ordered: Current medicines are  reviewed at length with the patient today.  Concerns regarding medicines are outlined above.  Tests Ordered: No orders of the defined types were placed in this encounter.  Medication Changes: No orders of the defined types were placed in this encounter.   Disposition:  Follow up in 1 year(s)  Signed, Fransico Him, MD  02/19/2020 9:58 AM    [facility]

## 2020-02-19 NOTE — Patient Instructions (Signed)

## 2020-02-22 DIAGNOSIS — E1142 Type 2 diabetes mellitus with diabetic polyneuropathy: Secondary | ICD-10-CM | POA: Diagnosis not present

## 2020-02-22 DIAGNOSIS — R5383 Other fatigue: Secondary | ICD-10-CM | POA: Diagnosis not present

## 2020-02-22 DIAGNOSIS — F419 Anxiety disorder, unspecified: Secondary | ICD-10-CM | POA: Diagnosis not present

## 2020-02-22 DIAGNOSIS — E7800 Pure hypercholesterolemia, unspecified: Secondary | ICD-10-CM | POA: Diagnosis not present

## 2020-02-22 DIAGNOSIS — I10 Essential (primary) hypertension: Secondary | ICD-10-CM | POA: Diagnosis not present

## 2020-02-22 DIAGNOSIS — Z0000 Encounter for general adult medical examination without abnormal findings: Secondary | ICD-10-CM | POA: Diagnosis not present

## 2020-02-22 DIAGNOSIS — I739 Peripheral vascular disease, unspecified: Secondary | ICD-10-CM | POA: Diagnosis not present

## 2020-02-27 ENCOUNTER — Encounter: Payer: Self-pay | Admitting: Cardiovascular Disease

## 2020-02-27 NOTE — Progress Notes (Signed)
Cardiology Office Note:    Date:  02/28/2020   ID:  Taylor [name], DOB 1945/11/25, MRN [id number]  PCP:  Lawerance Cruel, MD  Cardiologist:  Mertie Moores, MD    Referring MD: Lawerance Cruel, MD   Problem list 1. Coronary artery disease-status post coronary artery bypass grafting 2006 2. Peripheral vascular disease 3. History pulmonary embolus 4. History of breast cancer 5. Essential hypertension 6. Hyperlipidemia 7. Carotid artery disease 8. Diabetes  mellitus   Chief Complaint  Patient presents with  . Shortness of Breath  . Coronary Artery Disease        Taylor [name] is a 75 y.o. female with a hx of Coronary artery disease, hypertension, hyperlipidemia and peripheral vascular disease. She denies having any episodes of chest pain or shortness of breath. She complains of having lots of leg aches and pains. Symptoms do not sound like claudication. She has known occlusion of her popliteal arteries.  Nov. 8, 2019: Taylor [name] has a history of coronary artery disease and coronary artery bypass grafting in 2008.  She also has a history of hypertension, hyperlipidemia and peripheral vascular disease.  Is having DOE  Walking is limited by her claudication  Sees Dr. [name] for her PVC  On plavix after a stento placement in Nov. 2017   December 13, 2018:  [name] is seen today for follow-up of her coronary artery disease status post coronary artery bypass grafting in 2008.  She has a history of hypertension, hyperlipidemia, PAD.  She has been followed by Dr. [name] for PAD. No angina pain  Has palpitations  Has worsening DOE for the past 5 months  Has not had much cp with her angina episodes  Typically weakness and DOE are her angina equivalent .  Lipids have been managed by her primary medical doctor and have typically looked good.  Feb. 16, 2022: Taylor [name] is seen today for follow up of her CAD, CABG, HTN, HLD No CP , no dyspnea.  Has been seen by scott since I last saw her   Labs from Dr. Harrington Challenger were reviewed Trigs are elevated 215 - LDL is 42  HbA1c is 8.1  She eats lots of chocolate   Past Medical History:  Diagnosis Date  . Cancer of left breast ([place])   . Complication of anesthesia   . Coronary artery disease    a. 2006 s/p CABG x 4 (LIMA->LAD, VG->Diag->OM, VG->RPDA);  b. 02/2015: [place] s/p DES to [facility], VG->PDA & VG->D1->OM 100; c. 06/2015 Cath: diffuse dzs->Med rx; d. 11/2015 Cath/PCI: LM nl, LAD 100ost, 95d, D1 90ost, LCX 35ost, 65p, OM2 40, lat OM2 90, RCA 95p (2.75x16 Synergy DES), patent mid stent, RPLB 70, LIMA->LAD ok.EF55-65%.  . Depression   . Hx of tobacco use, presenting hazards to health    a. quit 2006.  [name] Hypercholesteremia   . Hypertension   . Hypertensive heart disease    Echo 04/2019: Inferior HK, EF 55, normal RV SF, RVSP normal at 29.2, mild BAE, trivial MR, mild aortic valve sclerosis without stenosis  . Left carotid bruit    a. 09/2014 Carotid U/S: 1-39% bilat ICA stenosis.  [name] PAD (peripheral artery disease) ([place])    a. 11/2014 ABI: R: 0.63, L 0.59;  b. 11/2014 Periph Angio: bilat pop occlusions. L - short w/ reconstituion via collats in dist pop w/ 2 vessel runoff, R long w/ reconstitution in prox tib/peroneal arteries-->med Rx w/ pletal.  . Pulmonary embolism ([place]) a. 2014.  [name] Reflux esophagitis   .  Type II diabetes mellitus ([place])     Past Surgical History:  Procedure Laterality Date  . BREAST BIOPSY Left ~ 2012  . BUNIONECTOMY Left 1970s  . CARDIAC CATHETERIZATION  04/2004  . CARDIAC CATHETERIZATION N/A 02/21/2015   Procedure: Left Heart Cath and Cors/Grafts Angiography;  Surgeon: Belva Crome, MD;  Location: [place] [facility];  Service: Cardiovascular;  Laterality: N/A;  . CARDIAC CATHETERIZATION N/A 02/21/2015   Procedure: Coronary Stent Intervention;  Surgeon: Belva Crome, MD;  Location: [place] [facility];  Service: Cardiovascular;  Laterality: N/A;  . CARDIAC CATHETERIZATION N/A 02/21/2015   Procedure: Intravascular Pressure  Wire/FFR Study;  Surgeon: Belva Crome, MD;  Location: [place] [facility];  Service: Cardiovascular;  Laterality: N/A;  . CARDIAC CATHETERIZATION N/A 06/20/2015   Procedure: Left Heart Cath and Cors/Grafts Angiography;  Surgeon: Larey Dresser, MD;  Location: [place] [facility];  Service: Cardiovascular;  Laterality: N/A;  . CARDIAC CATHETERIZATION N/A 11/19/2015   Procedure: Left Heart Cath and Cors/Grafts Angiography;  Surgeon: Peter M Martinique, MD;  Location: [place] [facility];  Service: Cardiovascular;  Laterality: N/A;  . CARDIAC CATHETERIZATION N/A 11/19/2015   Procedure: Coronary Stent Intervention;  Surgeon: Peter M Martinique, MD;  Location: [place] [facility];  Service: Cardiovascular;  Laterality: N/A;  . CATARACT EXTRACTION W/ INTRAOCULAR LENS  IMPLANT, BILATERAL Bilateral 1980s-1990s  . CORONARY ANGIOPLASTY WITH STENT PLACEMENT  02/21/2015   "1 stent"  . CORONARY ARTERY BYPASS GRAFT  05/2004   "CABG X4"  . EYE SURGERY    . LAPAROSCOPIC CHOLECYSTECTOMY  1990s  . MASTECTOMY Left ~ 2012  . PERIPHERAL VASCULAR CATHETERIZATION N/A 12/12/2014   Procedure: Abdominal Aortogram;  Surgeon: Wellington Hampshire, MD;  Location: [place] [facility];  Service: Cardiovascular;  Laterality: N/A;  . RETINAL DETACHMENT SURGERY Right 1990s    Current Medications: Current Meds  Medication Sig  . acetaminophen (TYLENOL) 500 MG tablet Take 1,000 mg by mouth every 6 (six) hours as needed for headache (pain).   [name] ALPRAZolam (XANAX) 0.25 MG tablet Take 0.25 mg by mouth daily as needed for anxiety.   [name] aspirin 81 MG tablet Take 81 mg by mouth daily.  . clopidogrel (PLAVIX) 75 MG tablet Take 1 tablet by mouth once daily  . isosorbide mononitrate (IMDUR) 60 MG 24 hr tablet Take 1 tablet (60 mg total) by mouth daily.  [name] lisinopril (ZESTRIL) 5 MG tablet Take 1 tablet by mouth once daily  . metFORMIN (GLUCOPHAGE) 500 MG tablet Take 1,000 mg by mouth 2 (two) times daily with a meal.   . nitroGLYCERIN (NITROSTAT) 0.4 MG  SL tablet Place 1 tablet (0.4 mg total) under the tongue every 5 (five) minutes as needed for chest pain.  . pioglitazone (ACTOS) 15 MG tablet Take 1 tablet by mouth daily.  . rosuvastatin (CRESTOR) 20 MG tablet TAKE ONE TABLET BY MOUTH ONCE DAILY AT  6PM  . sertraline (ZOLOFT) 100 MG tablet Take 100 mg by mouth daily.   . timolol (BETIMOL) 0.25 % ophthalmic solution 1-2 drops 2 (two) times daily.  . [DISCONTINUED] isosorbide mononitrate (IMDUR) 60 MG 24 hr tablet Take 1.5 tablets (90 mg total) by mouth daily.     Allergies:   Niacin and Lipitor [atorvastatin]   Social History   Socioeconomic History  . Marital status: Married    Spouse name: Not on file  . Number of children: Not on file  . Years of education: Not on file  .  Highest education level: Not on file  Occupational History  . Not on file  Tobacco Use  . Smoking status: Former Smoker    Packs/day: 0.50    Years: 15.00    Pack years: 7.50    Types: Cigarettes    Quit date: 05/11/2004    Years since quitting: 15.8  . Smokeless tobacco: Never Used  Vaping Use  . Vaping Use: Never used  Substance and Sexual Activity  . Alcohol use: No  . Drug use: No  . Sexual activity: Not Currently  Other Topics Concern  . Not on file  Social History Narrative   Lives in [place] with her husband.   Social Determinants of Health   Financial Resource Strain: Not on file  Food Insecurity: Not on file  Transportation Needs: Not on file  Physical Activity: Not on file  Stress: Not on file  Social Connections: Not on file     Family History: The patient's family history includes Cancer in her sister; Coronary artery disease (age of onset: 42) in her sister; Coronary artery disease (age of onset: 68) in her mother; Deep vein thrombosis in her cousin; Heart attack (age of onset: 49) in her father. ROS:   Please see the history of present illness.     All other systems reviewed and are negative.  EKGs/Labs/Other Studies  Reviewed:    The following studies were reviewed today:   EKG:   .     Recent Labs: No results found for requested labs within last 8760 hours.  Recent Lipid Panel    Component Value Date/Time   CHOL 86 (L) 01/27/2016 1034   TRIG 134 01/27/2016 1034   HDL 29 (L) 01/27/2016 1034   CHOLHDL 3.0 01/27/2016 1034   CHOLHDL 3.6 09/06/2015 0957   VLDL 20 09/06/2015 0957   LDLCALC 30 01/27/2016 1034    Physical Exam: Blood pressure 110/66, pulse (!) 58, height 5\' 7"  (1.702 m), weight 179 lb 12.8 oz (81.6 kg), SpO2 98 %.  GEN:  Well nourished, well developed in no acute distress HEENT: Normal NECK: No JVD; No carotid bruits LYMPHATICS: No lymphadenopathy CARDIAC: RRR , no murmurs, rubs, gallops RESPIRATORY:  Clear to auscultation without rales, wheezing or rhonchi  ABDOMEN: Soft, non-tender, non-distended MUSCULOSKELETAL:  No edema; No deformity  SKIN: Warm and dry NEUROLOGIC:  Alert and oriented x 3    ASSESSMENT:    No diagnosis found. PLAN:       1. Coronary artery disease: .  No angina .   She did not notice any difference with the increase of Imdur to 90.  Will reduce the dose back to 60 mg a day .  She will call if she experiences any noticeable change in her symptoms    2. HL - Chol looks good.   Con rosuvastatin .  Trigs are elevated - she admits to eating too much chocolate . HbA1c is 8.1   3. HTN - blood pressure is well controlled.  BP is well controlled.   4. Carotid artery disease -  Stable   5. PAD -followed by Dr. [name]   Medication Adjustments/Labs and Tests Ordered: Current medicines are reviewed at length with the patient today.  Concerns regarding medicines are outlined above.  No orders of the defined types were placed in this encounter.  Meds ordered this encounter  Medications  . isosorbide mononitrate (IMDUR) 60 MG 24 hr tablet    Sig: Take 1 tablet (60 mg total) by mouth daily.  Dispense:  90 tablet    Refill:  3    Decrease in  dose.   Will have her see Richardson Dopp, PA in a year   Signed, Mertie Moores, MD  02/28/2020 9:52 AM    [place]

## 2020-02-28 ENCOUNTER — Encounter: Payer: Self-pay | Admitting: Cardiovascular Disease

## 2020-02-28 ENCOUNTER — Ambulatory Visit (INDEPENDENT_AMBULATORY_CARE_PROVIDER_SITE_OTHER): Payer: Medicare Other | Admitting: Cardiovascular Disease

## 2020-02-28 ENCOUNTER — Other Ambulatory Visit: Payer: Self-pay

## 2020-02-28 VITALS — BP 110/66 | HR 58 | Ht 67.0 in | Wt 179.8 lb

## 2020-02-28 DIAGNOSIS — E7800 Pure hypercholesterolemia, unspecified: Secondary | ICD-10-CM | POA: Diagnosis not present

## 2020-02-28 DIAGNOSIS — I25708 Atherosclerosis of coronary artery bypass graft(s), unspecified, with other forms of angina pectoris: Secondary | ICD-10-CM | POA: Diagnosis not present

## 2020-02-28 MED ORDER — ISOSORBIDE MONONITRATE ER 60 MG PO TB24: 60.0000 mg | ORAL_TABLET | Freq: Every day | ORAL | 3 refills | Status: DC

## 2020-02-28 NOTE — Patient Instructions (Addendum)
Medication Instructions:  Your physician has recommended you make the following change in your medication:  Decrease Imdur to 60mg  daily  *If you need a refill on your cardiac medications before your next appointment, please call your pharmacy*   Lab Work: none If you have labs (blood work) drawn today and your tests are completely normal, you will receive your results only by: [name] MyChart Message (if you have MyChart) OR . A paper copy in the mail If you have any lab test that is abnormal or we need to change your treatment, we will call you to review the results.   Testing/Procedures: none   Follow-Up: At [facility], you and your health needs are our priority.  As part of our continuing mission to provide you with exceptional heart care, we have created designated Provider Care Teams.  These Care Teams include your primary Cardiologist (physician) and Advanced Practice Providers (APPs -  Physician Assistants and Nurse Practitioners) who all work together to provide you with the care you need, when you need it.  We recommend signing up for the patient portal called "MyChart".  Sign up information is provided on this After Visit Summary.  MyChart is used to connect with patients for Virtual Visits (Telemedicine).  Patients are able to view lab/test results, encounter notes, upcoming appointments, etc.  Non-urgent messages can be sent to your provider as well.   To learn more about what you can do with MyChart, go to [email or web address].    Your next appointment:   1Year  The format for your next appointment:   In Person  Provider:   Richardson Dopp, PA-C

## 2020-03-06 ENCOUNTER — Telehealth: Payer: Self-pay | Admitting: *Deleted

## 2020-03-06 NOTE — Telephone Encounter (Signed)
-----   Message from Sueanne Margarita, MD sent at 02/19/2020  9:58 AM EST ----- Please repeat download in 2 weeks

## 2020-03-06 NOTE — Telephone Encounter (Signed)
Download printed and scanned for TT review.

## 2020-03-29 ENCOUNTER — Telehealth: Payer: Self-pay | Admitting: *Deleted

## 2020-03-29 NOTE — Telephone Encounter (Signed)
Patient understands her AHI showed mildly elevated at 7.3. Gave patient recommended pressure changes to made by Dr. Radford Pax to avoid sleeping on her back and change her cushion every 6 weeks and understanding was verbalized.

## 2020-03-29 NOTE — Telephone Encounter (Signed)
Patient understands her AHI showed mildly elevated at 7.3. Gave patient recommended pressure changes to made by Dr. Radford Pax to avoid sleeping on her back and change her cushion every 6 weeks.

## 2020-03-29 NOTE — Telephone Encounter (Signed)
-----   Message from Sueanne Margarita, MD sent at 03/08/2020 10:18 AM EST ----- AHI is mildly elevated - please encouraged her to try to avoid sleeping supine and change out cushions every 6 weeks

## 2020-04-02 DIAGNOSIS — H31093 Other chorioretinal scars, bilateral: Secondary | ICD-10-CM | POA: Diagnosis not present

## 2020-04-02 DIAGNOSIS — H1045 Other chronic allergic conjunctivitis: Secondary | ICD-10-CM | POA: Diagnosis not present

## 2020-04-02 DIAGNOSIS — H04123 Dry eye syndrome of bilateral lacrimal glands: Secondary | ICD-10-CM | POA: Diagnosis not present

## 2020-04-02 DIAGNOSIS — D3132 Benign neoplasm of left choroid: Secondary | ICD-10-CM | POA: Diagnosis not present

## 2020-04-02 DIAGNOSIS — H40013 Open angle with borderline findings, low risk, bilateral: Secondary | ICD-10-CM | POA: Diagnosis not present

## 2020-04-02 DIAGNOSIS — Z961 Presence of intraocular lens: Secondary | ICD-10-CM | POA: Diagnosis not present

## 2020-04-02 DIAGNOSIS — E113551 Type 2 diabetes mellitus with stable proliferative diabetic retinopathy, right eye: Secondary | ICD-10-CM | POA: Diagnosis not present

## 2020-06-26 DIAGNOSIS — L700 Acne vulgaris: Secondary | ICD-10-CM | POA: Diagnosis not present

## 2020-08-02 ENCOUNTER — Other Ambulatory Visit: Payer: Self-pay

## 2020-08-02 ENCOUNTER — Encounter (INDEPENDENT_AMBULATORY_CARE_PROVIDER_SITE_OTHER): Payer: Medicare Other | Admitting: Ophthalmology

## 2020-08-02 DIAGNOSIS — E113591 Type 2 diabetes mellitus with proliferative diabetic retinopathy without macular edema, right eye: Secondary | ICD-10-CM | POA: Diagnosis not present

## 2020-08-02 DIAGNOSIS — H43813 Vitreous degeneration, bilateral: Secondary | ICD-10-CM | POA: Diagnosis not present

## 2020-08-02 DIAGNOSIS — E113392 Type 2 diabetes mellitus with moderate nonproliferative diabetic retinopathy without macular edema, left eye: Secondary | ICD-10-CM | POA: Diagnosis not present

## 2020-08-02 DIAGNOSIS — D3132 Benign neoplasm of left choroid: Secondary | ICD-10-CM

## 2020-08-02 DIAGNOSIS — I10 Essential (primary) hypertension: Secondary | ICD-10-CM

## 2020-08-02 DIAGNOSIS — H35033 Hypertensive retinopathy, bilateral: Secondary | ICD-10-CM

## 2020-08-16 DIAGNOSIS — C4361 Malignant melanoma of right upper limb, including shoulder: Secondary | ICD-10-CM | POA: Diagnosis not present

## 2020-08-21 DIAGNOSIS — N183 Chronic kidney disease, stage 3 unspecified: Secondary | ICD-10-CM | POA: Diagnosis not present

## 2020-08-21 DIAGNOSIS — E1142 Type 2 diabetes mellitus with diabetic polyneuropathy: Secondary | ICD-10-CM | POA: Diagnosis not present

## 2020-08-21 DIAGNOSIS — F419 Anxiety disorder, unspecified: Secondary | ICD-10-CM | POA: Diagnosis not present

## 2020-08-21 DIAGNOSIS — I10 Essential (primary) hypertension: Secondary | ICD-10-CM | POA: Diagnosis not present

## 2020-08-23 DIAGNOSIS — R42 Dizziness and giddiness: Secondary | ICD-10-CM | POA: Diagnosis not present

## 2020-08-23 DIAGNOSIS — Z6827 Body mass index (BMI) 27.0-27.9, adult: Secondary | ICD-10-CM | POA: Diagnosis not present

## 2020-08-23 DIAGNOSIS — H9203 Otalgia, bilateral: Secondary | ICD-10-CM | POA: Diagnosis not present

## 2020-08-23 DIAGNOSIS — R059 Cough, unspecified: Secondary | ICD-10-CM | POA: Diagnosis not present

## 2020-08-23 DIAGNOSIS — U071 COVID-19: Secondary | ICD-10-CM | POA: Diagnosis not present

## 2020-11-17 ENCOUNTER — Other Ambulatory Visit: Payer: Self-pay | Admitting: Cardiovascular Disease

## 2020-11-26 DIAGNOSIS — D225 Melanocytic nevi of trunk: Secondary | ICD-10-CM | POA: Diagnosis not present

## 2020-11-26 DIAGNOSIS — C4361 Malignant melanoma of right upper limb, including shoulder: Secondary | ICD-10-CM | POA: Diagnosis not present

## 2020-11-26 DIAGNOSIS — Z85820 Personal history of malignant melanoma of skin: Secondary | ICD-10-CM | POA: Diagnosis not present

## 2020-11-26 DIAGNOSIS — Z08 Encounter for follow-up examination after completed treatment for malignant neoplasm: Secondary | ICD-10-CM | POA: Diagnosis not present

## 2020-11-26 DIAGNOSIS — L821 Other seborrheic keratosis: Secondary | ICD-10-CM | POA: Diagnosis not present

## 2020-11-28 DIAGNOSIS — H5713 Ocular pain, bilateral: Secondary | ICD-10-CM | POA: Diagnosis not present

## 2020-11-28 DIAGNOSIS — H40013 Open angle with borderline findings, low risk, bilateral: Secondary | ICD-10-CM | POA: Diagnosis not present

## 2020-12-10 DIAGNOSIS — C4361 Malignant melanoma of right upper limb, including shoulder: Secondary | ICD-10-CM | POA: Diagnosis not present

## 2020-12-10 DIAGNOSIS — D485 Neoplasm of uncertain behavior of skin: Secondary | ICD-10-CM | POA: Diagnosis not present

## 2020-12-11 DIAGNOSIS — L308 Other specified dermatitis: Secondary | ICD-10-CM | POA: Diagnosis not present

## 2020-12-11 DIAGNOSIS — L309 Dermatitis, unspecified: Secondary | ICD-10-CM | POA: Diagnosis not present

## 2020-12-25 DIAGNOSIS — C4361 Malignant melanoma of right upper limb, including shoulder: Secondary | ICD-10-CM | POA: Diagnosis not present

## 2021-01-08 DIAGNOSIS — C4361 Malignant melanoma of right upper limb, including shoulder: Secondary | ICD-10-CM | POA: Diagnosis not present

## 2021-01-28 DIAGNOSIS — Z880 Allergy status to penicillin: Secondary | ICD-10-CM | POA: Diagnosis not present

## 2021-01-28 DIAGNOSIS — Z7901 Long term (current) use of anticoagulants: Secondary | ICD-10-CM | POA: Diagnosis not present

## 2021-01-28 DIAGNOSIS — Z01818 Encounter for other preprocedural examination: Secondary | ICD-10-CM | POA: Diagnosis not present

## 2021-01-28 DIAGNOSIS — C4361 Malignant melanoma of right upper limb, including shoulder: Secondary | ICD-10-CM | POA: Diagnosis not present

## 2021-01-28 DIAGNOSIS — Z7982 Long term (current) use of aspirin: Secondary | ICD-10-CM | POA: Diagnosis not present

## 2021-01-28 DIAGNOSIS — Z7902 Long term (current) use of antithrombotics/antiplatelets: Secondary | ICD-10-CM | POA: Diagnosis not present

## 2021-01-28 DIAGNOSIS — Z539 Procedure and treatment not carried out, unspecified reason: Secondary | ICD-10-CM | POA: Diagnosis not present

## 2021-01-28 DIAGNOSIS — I2510 Atherosclerotic heart disease of native coronary artery without angina pectoris: Secondary | ICD-10-CM | POA: Diagnosis not present

## 2021-02-14 DIAGNOSIS — C4361 Malignant melanoma of right upper limb, including shoulder: Secondary | ICD-10-CM | POA: Diagnosis not present

## 2021-02-14 DIAGNOSIS — Z09 Encounter for follow-up examination after completed treatment for conditions other than malignant neoplasm: Secondary | ICD-10-CM | POA: Diagnosis not present

## 2021-03-03 ENCOUNTER — Other Ambulatory Visit: Payer: Self-pay | Admitting: Cardiovascular Disease

## 2021-04-02 DIAGNOSIS — Z0000 Encounter for general adult medical examination without abnormal findings: Secondary | ICD-10-CM | POA: Diagnosis not present

## 2021-04-02 DIAGNOSIS — E11319 Type 2 diabetes mellitus with unspecified diabetic retinopathy without macular edema: Secondary | ICD-10-CM | POA: Diagnosis not present

## 2021-04-02 DIAGNOSIS — I209 Angina pectoris, unspecified: Secondary | ICD-10-CM | POA: Diagnosis not present

## 2021-04-02 DIAGNOSIS — I10 Essential (primary) hypertension: Secondary | ICD-10-CM | POA: Diagnosis not present

## 2021-04-02 DIAGNOSIS — E7800 Pure hypercholesterolemia, unspecified: Secondary | ICD-10-CM | POA: Diagnosis not present

## 2021-04-02 DIAGNOSIS — E1142 Type 2 diabetes mellitus with diabetic polyneuropathy: Secondary | ICD-10-CM | POA: Diagnosis not present

## 2021-04-02 DIAGNOSIS — F419 Anxiety disorder, unspecified: Secondary | ICD-10-CM | POA: Diagnosis not present

## 2021-04-02 DIAGNOSIS — N183 Chronic kidney disease, stage 3 unspecified: Secondary | ICD-10-CM | POA: Diagnosis not present

## 2021-04-02 DIAGNOSIS — I739 Peripheral vascular disease, unspecified: Secondary | ICD-10-CM | POA: Diagnosis not present

## 2021-04-09 ENCOUNTER — Ambulatory Visit (INDEPENDENT_AMBULATORY_CARE_PROVIDER_SITE_OTHER): Payer: Medicare Other | Admitting: Podiatry

## 2021-04-09 ENCOUNTER — Other Ambulatory Visit: Payer: Self-pay | Admitting: Cardiovascular Disease

## 2021-04-09 ENCOUNTER — Other Ambulatory Visit: Payer: Self-pay

## 2021-04-09 ENCOUNTER — Encounter: Payer: Self-pay | Admitting: Podiatry

## 2021-04-09 DIAGNOSIS — M79674 Pain in right toe(s): Secondary | ICD-10-CM | POA: Insufficient documentation

## 2021-04-09 DIAGNOSIS — B351 Tinea unguium: Secondary | ICD-10-CM | POA: Diagnosis not present

## 2021-04-09 DIAGNOSIS — E1159 Type 2 diabetes mellitus with other circulatory complications: Secondary | ICD-10-CM | POA: Diagnosis not present

## 2021-04-09 DIAGNOSIS — M79675 Pain in left toe(s): Secondary | ICD-10-CM | POA: Diagnosis not present

## 2021-04-09 DIAGNOSIS — E1140 Type 2 diabetes mellitus with diabetic neuropathy, unspecified: Secondary | ICD-10-CM | POA: Insufficient documentation

## 2021-04-09 NOTE — Progress Notes (Signed)
This patient presents to the office with chief complaint of long thick  big nails and diabetic feet.  This patient  says there  is  no pain and discomfort in her  feet.  This patient says there are long thick painful nails.  These nails are painful walking and wearing shoes.  Patient has no history of infection or drainage from both feet.  Patient is unable to  self treat his own nails . This patient presents  to the office today for treatment of the  long nails and a foot evaluation due to history of  diabetes.She has history of type 2 diabetes,and coagulation defect due to plavix. ? ?General Appearance  Alert, conversant and in no acute stress. ? ?Vascular  Dorsalis pedis and posterior tibial  pulses are  weakly palpable  bilaterally.  Capillary return is within normal limits  bilaterally. Temperature is within normal limits  bilaterally. ? ?Neurologic  Senn-Weinstein monofilament wire test diminished/absent  bilaterally. Muscle power within normal limits bilaterally. ? ?Nails Thick disfigured discolored nails with subungual debris  hallux nails bilaterally. No evidence of bacterial infection or drainage bilaterally. ? ?Orthopedic  No limitations of motion of motion feet .  No crepitus or effusions noted.  No bony pathology or digital deformities noted. ? ?Skin  normotropic skin with no porokeratosis noted bilaterally.  No signs of infections or ulcers noted.    ? ?Onychomycosis  Diabetes with no foot complications ? ?IE  Debride nails x 10.  A diabetic foot exam was performed and there is evidence of any vascular or neurologic pathology.   RTC 1 year.  Patient will discuss diabetic shoes with her husband. ? ? ?Gardiner Barefoot DPM   ?

## 2021-04-12 ENCOUNTER — Other Ambulatory Visit: Payer: Self-pay | Admitting: Cardiovascular Disease

## 2021-04-14 ENCOUNTER — Other Ambulatory Visit: Payer: Self-pay | Admitting: *Deleted

## 2021-04-14 MED ORDER — CLOPIDOGREL BISULFATE 75 MG PO TABS: 75.0000 mg | ORAL_TABLET | Freq: Every day | ORAL | 0 refills | Status: DC

## 2021-05-12 ENCOUNTER — Ambulatory Visit (INDEPENDENT_AMBULATORY_CARE_PROVIDER_SITE_OTHER): Payer: Medicare Other | Admitting: Cardiovascular Disease

## 2021-05-12 ENCOUNTER — Encounter: Payer: Self-pay | Admitting: Cardiovascular Disease

## 2021-05-12 VITALS — BP 132/70 | HR 57 | Ht 67.0 in | Wt 178.8 lb

## 2021-05-12 DIAGNOSIS — I10 Essential (primary) hypertension: Secondary | ICD-10-CM | POA: Diagnosis not present

## 2021-05-12 DIAGNOSIS — I25119 Atherosclerotic heart disease of native coronary artery with unspecified angina pectoris: Secondary | ICD-10-CM

## 2021-05-12 NOTE — Progress Notes (Signed)
?Cardiology Office Note:   ? ?Date:  05/12/2021  ? ?ID:  Taylor [name], DOB [birth year], MRN [id number] ? ?PCP:  Lawerance Cruel, MD  ?Cardiologist:  Mertie Moores, MD   ? ?Referring MD: Lawerance Cruel, MD  ? ?Problem list ?1. Coronary artery disease-status post coronary artery bypass grafting 2006 ?2. Peripheral vascular disease ?3. History pulmonary embolus ?4. History of breast cancer ?5. Essential hypertension ?6. Hyperlipidemia ?7. Carotid artery disease ?8. Diabetes  mellitus ? ? ?Chief Complaint  ?Patient presents with  ? Coronary Artery Disease  ?   ?  ? Hypertension  ?   ?  ? Hyperlipidemia  ? ? ?   ? ?Taylor [name] is a 76 y.o. female with a hx of Coronary artery disease, hypertension, hyperlipidemia and peripheral vascular disease. She denies having any episodes of chest pain or shortness of breath. She complains of having lots of leg aches and pains. Symptoms do not sound like claudication. She has known occlusion of her popliteal arteries. ? ?Nov. 8, 2019: ?Taylor [name] has a history of coronary artery disease and coronary artery bypass grafting in 2008.  She also has a history of hypertension, hyperlipidemia and peripheral vascular disease. ? ?Is having DOE  ?Walking is limited by her claudication  ?Sees Dr. [name] for her PVC  ?On plavix after a stento placement in Nov. 2017  ? ?December 13, 2018: ? Taylor [name] is seen today for follow-up of her coronary artery disease status post coronary artery bypass grafting in 2008.  She has a history of hypertension, hyperlipidemia, PAD.  She has been followed by Dr. [name] for PAD. ?No angina pain  ?Has palpitations  ?Has worsening DOE for the past 5 months  ?Has not had much cp with her angina episodes  ?Typically weakness and DOE are her angina equivalent .  ?Lipids have been managed by her primary medical doctor and have typically looked good. ? ?Feb. 16, 2022: ?Taylor [name] is seen today for follow up of her CAD, CABG, HTN, HLD ?No CP , no dyspnea.  ?Has been seen by scott  since I last saw her  ?Labs from Dr. Harrington Challenger were reviewed ?Trigs are elevated 215 - LDL is 42  ?HbA1c is 8.1  ?She eats lots of chocolate  ? ?May 12, 2021 ?Taylor [name] is seen today  ?No cp,   has some DOE  when she climbs stairs too quickly  ? Has  ? ?Trigs are still very elevated.   Trying to reduce her intake of carbs and chocolate  ? ? ?Past Medical History:  ?Diagnosis Date  ? Cancer of left breast ([place])   ? Complication of anesthesia   ? Coronary artery disease   ? a. 2006 s/p CABG x 4 (LIMA->LAD, VG->Diag->OM, VG->RPDA);  b. 02/2015: [place] s/p DES to [facility], VG->PDA & VG->D1->OM 100; c. 06/2015 Cath: diffuse dzs->Med rx; d. 11/2015 Cath/PCI: LM nl, LAD 100ost, 95d, D1 90ost, LCX 35ost, 65p, OM2 40, lat OM2 90, RCA 95p (2.75x16 Synergy DES), patent mid stent, RPLB 70, LIMA->LAD ok.EF55-65%.  ? Depression   ? Hx of tobacco use, presenting hazards to health   ? a. quit 2006.  ? Hypercholesteremia   ? Hypertension   ? Hypertensive heart disease   ? Echo 04/2019: Inferior HK, EF 55, normal RV SF, RVSP normal at 29.2, mild BAE, trivial MR, mild aortic valve sclerosis without stenosis  ? Left carotid bruit   ? a. 09/2014 Carotid U/S: 1-39% bilat ICA stenosis.  ? PAD (peripheral  artery disease) ([place])   ? a. 11/2014 ABI: R: 0.63, L 0.59;  b. 11/2014 Periph Angio: bilat pop occlusions. L - short w/ reconstituion via collats in dist pop w/ 2 vessel runoff, R long w/ reconstitution in prox tib/peroneal arteries-->med Rx w/ pletal.  ? Pulmonary embolism ([place]) a. 2014.  ? Reflux esophagitis   ? Type II diabetes mellitus ([place])   ? ? ?Past Surgical History:  ?Procedure Laterality Date  ? BREAST BIOPSY Left ~ 2012  ? BUNIONECTOMY Left 1970s  ? CARDIAC CATHETERIZATION  04/2004  ? CARDIAC CATHETERIZATION N/A 02/21/2015  ? Procedure: Left Heart Cath and Cors/Grafts Angiography;  Surgeon: Belva Crome, MD;  Location: [place] [facility];  Service: Cardiovascular;  Laterality: N/A;  ? CARDIAC CATHETERIZATION N/A 02/21/2015  ? Procedure: Coronary Stent  Intervention;  Surgeon: Belva Crome, MD;  Location: [place] [facility];  Service: Cardiovascular;  Laterality: N/A;  ? CARDIAC CATHETERIZATION N/A 02/21/2015  ? Procedure: Intravascular Pressure Wire/FFR Study;  Surgeon: Belva Crome, MD;  Location: [place] [facility];  Service: Cardiovascular;  Laterality: N/A;  ? CARDIAC CATHETERIZATION N/A 06/20/2015  ? Procedure: Left Heart Cath and Cors/Grafts Angiography;  Surgeon: Larey Dresser, MD;  Location: [place] [facility];  Service: Cardiovascular;  Laterality: N/A;  ? CARDIAC CATHETERIZATION N/A 11/19/2015  ? Procedure: Left Heart Cath and Cors/Grafts Angiography;  Surgeon: Peter M Martinique, MD;  Location: [place] [facility];  Service: Cardiovascular;  Laterality: N/A;  ? CARDIAC CATHETERIZATION N/A 11/19/2015  ? Procedure: Coronary Stent Intervention;  Surgeon: Peter M Martinique, MD;  Location: [place] [facility];  Service: Cardiovascular;  Laterality: N/A;  ? CATARACT EXTRACTION W/ INTRAOCULAR LENS  IMPLANT, BILATERAL Bilateral 1980s-1990s  ? CORONARY ANGIOPLASTY WITH STENT PLACEMENT  02/21/2015  ? "1 stent"  ? CORONARY ARTERY BYPASS GRAFT  05/2004  ? "CABG X4"  ? EYE SURGERY    ? LAPAROSCOPIC CHOLECYSTECTOMY  1990s  ? MASTECTOMY Left ~ 2012  ? PERIPHERAL VASCULAR CATHETERIZATION N/A 12/12/2014  ? Procedure: Abdominal Aortogram;  Surgeon: Wellington Hampshire, MD;  Location: [place] [facility];  Service: Cardiovascular;  Laterality: N/A;  ? RETINAL DETACHMENT SURGERY Right 1990s  ? ? ?Current Medications: ?Current Meds  ?Medication Sig  ? acetaminophen (TYLENOL) 500 MG tablet Take 1,000 mg by mouth every 6 (six) hours as needed for headache (pain).   ? ALPRAZolam (XANAX) 0.25 MG tablet Take 0.25 mg by mouth daily as needed for anxiety.   ? aspirin 81 MG tablet Take 81 mg by mouth daily.  ? clopidogrel (PLAVIX) 75 MG tablet Take 1 tablet (75 mg total) by mouth daily. Please make overdue appt for future refills Thank you 1st attempt  ? glipiZIDE (GLUCOTROL) 5 MG tablet Take 10 mg  by mouth daily.  ? isosorbide mononitrate (IMDUR) 60 MG 24 hr tablet Take 1 tablet by mouth once daily  ? lisinopril (ZESTRIL) 10 MG tablet Take 10 mg by mouth daily.  ? metFORMIN (GLUCOPHAGE) 500 MG tablet Take 1,000 mg by mouth 2 (two) times daily with a meal.   ? nitroGLYCERIN (NITROSTAT) 0.4 MG SL tablet Place 1 tablet (0.4 mg total) under the tongue every 5 (five) minutes as needed for chest pain.  ? rosuvastatin (CRESTOR) 20 MG tablet TAKE ONE TABLET BY MOUTH ONCE DAILY AT  6PM  ? sertraline (ZOLOFT) 100 MG tablet Take 100 mg by mouth daily.   ? timolol (BETIMOL) 0.25 % ophthalmic solution 1-2 drops 2 (two) times  daily.  ?  ? ?Allergies:   Niacin, Duloxetine, Amoxicillin, Ibuprofen, and Lipitor [atorvastatin]  ? ?Social History  ? ?Socioeconomic History  ? Marital status: Married  ?  Spouse name: Not on file  ? Number of children: Not on file  ? Years of education: Not on file  ? Highest education level: Not on file  ?Occupational History  ? Not on file  ?Tobacco Use  ? Smoking status: Former  ?  Packs/day: 0.50  ?  Years: 15.00  ?  Pack years: 7.50  ?  Types: Cigarettes  ?  Quit date: 05/11/2004  ?  Years since quitting: 17.0  ? Smokeless tobacco: Never  ?Vaping Use  ? Vaping Use: Never used  ?Substance and Sexual Activity  ? Alcohol use: No  ? Drug use: No  ? Sexual activity: Not Currently  ?Other Topics Concern  ? Not on file  ?Social History Narrative  ? Lives in [place] with her husband.  ? ?Social Determinants of Health  ? ?Financial Resource Strain: Not on file  ?Food Insecurity: Not on file  ?Transportation Needs: Not on file  ?Physical Activity: Not on file  ?Stress: Not on file  ?Social Connections: Not on file  ?  ? ?Family History: ?The patient's family history includes Cancer in her sister; Coronary artery disease (age of onset: 32) in her sister; Coronary artery disease (age of onset: 34) in her mother; Deep vein thrombosis in her cousin; Heart attack (age of onset: 68) in her father. ?ROS:    ?Please see the history of present illness.    ? All other systems reviewed and are negative. ? ?EKGs/Labs/Other Studies Reviewed:   ? ?The following studies were reviewed today: ? ? ?EKG:     May 1,

## 2021-05-12 NOTE — Patient Instructions (Signed)
Medication Instructions:  ?Your physician recommends that you continue on your current medications as directed. Please refer to the Current Medication list given to you today. ? ?*If you need a refill on your cardiac medications before your next appointment, please call your pharmacy* ? ? ?Lab Work: ?None ?If you have labs (blood work) drawn today and your tests are completely normal, you will receive your results only by: ?MyChart Message (if you have MyChart) OR ?A paper copy in the mail ?If you have any lab test that is abnormal or we need to change your treatment, we will call you to review the results. ? ? ?Follow-Up: ?At [facility], you and your health needs are our priority.  As part of our continuing mission to provide you with exceptional heart care, we have created designated Provider Care Teams.  These Care Teams include your primary Cardiologist (physician) and Advanced Practice Providers (APPs -  Physician Assistants and Nurse Practitioners) who all work together to provide you with the care you need, when you need it. ? ?Your next appointment:   ?1 year(s) ? ?The format for your next appointment:   ?In Person ? ?Provider:   ?Mertie Moores, MD   ? ?Important Information About Sugar ? ? ? ? ?  ?

## 2021-05-25 ENCOUNTER — Other Ambulatory Visit: Payer: Self-pay | Admitting: Cardiovascular Disease

## 2021-05-29 DIAGNOSIS — H40013 Open angle with borderline findings, low risk, bilateral: Secondary | ICD-10-CM | POA: Diagnosis not present

## 2021-05-29 DIAGNOSIS — H1045 Other chronic allergic conjunctivitis: Secondary | ICD-10-CM | POA: Diagnosis not present

## 2021-05-29 DIAGNOSIS — Z961 Presence of intraocular lens: Secondary | ICD-10-CM | POA: Diagnosis not present

## 2021-05-29 DIAGNOSIS — H31093 Other chorioretinal scars, bilateral: Secondary | ICD-10-CM | POA: Diagnosis not present

## 2021-05-29 DIAGNOSIS — D3132 Benign neoplasm of left choroid: Secondary | ICD-10-CM | POA: Diagnosis not present

## 2021-05-29 DIAGNOSIS — E113551 Type 2 diabetes mellitus with stable proliferative diabetic retinopathy, right eye: Secondary | ICD-10-CM | POA: Diagnosis not present

## 2021-05-29 DIAGNOSIS — H04123 Dry eye syndrome of bilateral lacrimal glands: Secondary | ICD-10-CM | POA: Diagnosis not present

## 2021-06-04 DIAGNOSIS — L821 Other seborrheic keratosis: Secondary | ICD-10-CM | POA: Diagnosis not present

## 2021-06-04 DIAGNOSIS — Z08 Encounter for follow-up examination after completed treatment for malignant neoplasm: Secondary | ICD-10-CM | POA: Diagnosis not present

## 2021-06-04 DIAGNOSIS — Z85820 Personal history of malignant melanoma of skin: Secondary | ICD-10-CM | POA: Diagnosis not present

## 2021-07-23 ENCOUNTER — Other Ambulatory Visit: Payer: Self-pay | Admitting: Cardiovascular Disease

## 2021-08-05 ENCOUNTER — Encounter (INDEPENDENT_AMBULATORY_CARE_PROVIDER_SITE_OTHER): Payer: Medicare Other | Admitting: Ophthalmology

## 2021-08-05 DIAGNOSIS — H338 Other retinal detachments: Secondary | ICD-10-CM | POA: Diagnosis not present

## 2021-08-05 DIAGNOSIS — H35033 Hypertensive retinopathy, bilateral: Secondary | ICD-10-CM | POA: Diagnosis not present

## 2021-08-05 DIAGNOSIS — E113591 Type 2 diabetes mellitus with proliferative diabetic retinopathy without macular edema, right eye: Secondary | ICD-10-CM | POA: Diagnosis not present

## 2021-08-05 DIAGNOSIS — I10 Essential (primary) hypertension: Secondary | ICD-10-CM | POA: Diagnosis not present

## 2021-08-05 DIAGNOSIS — E113292 Type 2 diabetes mellitus with mild nonproliferative diabetic retinopathy without macular edema, left eye: Secondary | ICD-10-CM | POA: Diagnosis not present

## 2021-08-05 DIAGNOSIS — H43813 Vitreous degeneration, bilateral: Secondary | ICD-10-CM

## 2021-08-19 DIAGNOSIS — C4361 Malignant melanoma of right upper limb, including shoulder: Secondary | ICD-10-CM | POA: Diagnosis not present

## 2021-08-19 DIAGNOSIS — R590 Localized enlarged lymph nodes: Secondary | ICD-10-CM | POA: Diagnosis not present

## 2021-08-21 ENCOUNTER — Telehealth: Payer: Self-pay | Admitting: Cardiovascular Disease

## 2021-08-21 NOTE — Telephone Encounter (Signed)
Gave patient fax and information about preop clearance that the doctor doing the biopsy needs to send [place]. Of note patient states this is scheduled for next Thursday 8/17.

## 2021-08-21 NOTE — Telephone Encounter (Signed)
Patient called stating she is having a biopsy done under her arm.  She is calling because she is on blood thinners she wants to know if she needs to come off of it for the biopsy.

## 2021-08-21 NOTE — Telephone Encounter (Signed)
Please obtain further details and enter clearance. Had prior note in 2018 from Dr. Acie Fredrickson to hold Plavix, continued on ASA at that time. Need to know if antiplatelets needing to be held. Time sensitive.

## 2021-08-22 ENCOUNTER — Ambulatory Visit (INDEPENDENT_AMBULATORY_CARE_PROVIDER_SITE_OTHER): Payer: Medicare Other | Admitting: Physician Assistant

## 2021-08-22 ENCOUNTER — Telehealth: Payer: Self-pay | Admitting: *Deleted

## 2021-08-22 DIAGNOSIS — Z01810 Encounter for preprocedural cardiovascular examination: Secondary | ICD-10-CM | POA: Diagnosis not present

## 2021-08-22 NOTE — Progress Notes (Signed)
Virtual Visit via Telephone Note   Because of Taylor [name]'s co-morbid illnesses, she is at least at moderate risk for complications without adequate follow up.  This format is felt to be most appropriate for this patient at this time.  The patient did not have access to video technology/had technical difficulties with video requiring transitioning to audio format only (telephone).  All issues noted in this document were discussed and addressed.  No physical exam could be performed with this format.  Please refer to the patient's chart for her consent to telehealth for [facility].  Evaluation Performed:  Preoperative cardiovascular risk assessment _____________   Date:  08/22/2021   Patient ID:  Taylor [name], DOB 19-Aug-1945, MRN [id number] Patient Location:  Home Provider location:   Office  Primary Care Provider:  Lawerance Cruel, MD Primary Cardiologist:  Mertie Moores, MD  Chief Complaint / Patient Profile   76 y.o. y/o female with a h/o coronary artery disease status post CABG 2006, PAD, history of PE, history of breast cancer, essential hypertension, hyperlipidemia, carotid artery disease, and diabetes mellitus who is pending U/S GUIDED Bx  ; AXILLARY LYMPHADENOPATHY and presents today for telephonic preoperative cardiovascular risk assessment.  Past Medical History    Past Medical History:  Diagnosis Date   Cancer of left breast ([place])    Complication of anesthesia    Coronary artery disease    a. 2006 s/p CABG x 4 (LIMA->LAD, VG->Diag->OM, VG->RPDA);  b. 02/2015: [place] s/p DES to [facility], VG->PDA & VG->D1->OM 100; c. 06/2015 Cath: diffuse dzs->Med rx; d. 11/2015 Cath/PCI: LM nl, LAD 100ost, 95d, D1 90ost, LCX 35ost, 65p, OM2 40, lat OM2 90, RCA 95p (2.75x16 Synergy DES), patent mid stent, RPLB 70, LIMA->LAD ok.EF55-65%.   Depression    Hx of tobacco use, presenting hazards to health    a. quit 2006.   Hypercholesteremia    Hypertension    Hypertensive heart disease     Echo 04/2019: Inferior HK, EF 55, normal RV SF, RVSP normal at 29.2, mild BAE, trivial MR, mild aortic valve sclerosis without stenosis   Left carotid bruit    a. 09/2014 Carotid U/S: 1-39% bilat ICA stenosis.   PAD (peripheral artery disease) ([place])    a. 11/2014 ABI: R: 0.63, L 0.59;  b. 11/2014 Periph Angio: bilat pop occlusions. L - short w/ reconstituion via collats in dist pop w/ 2 vessel runoff, R long w/ reconstitution in prox tib/peroneal arteries-->med Rx w/ pletal.   Pulmonary embolism ([place]) a. 2014.   Reflux esophagitis    Type II diabetes mellitus ([place])    Past Surgical History:  Procedure Laterality Date   BREAST BIOPSY Left ~ 2012   BUNIONECTOMY Left 1970s   CARDIAC CATHETERIZATION  04/2004   CARDIAC CATHETERIZATION N/A 02/21/2015   Procedure: Left Heart Cath and Cors/Grafts Angiography;  Surgeon: Belva Crome, MD;  Location: [place] [facility];  Service: Cardiovascular;  Laterality: N/A;   CARDIAC CATHETERIZATION N/A 02/21/2015   Procedure: Coronary Stent Intervention;  Surgeon: Belva Crome, MD;  Location: [place] [facility];  Service: Cardiovascular;  Laterality: N/A;   CARDIAC CATHETERIZATION N/A 02/21/2015   Procedure: Intravascular Pressure Wire/FFR Study;  Surgeon: Belva Crome, MD;  Location: [place] [facility];  Service: Cardiovascular;  Laterality: N/A;   CARDIAC CATHETERIZATION N/A 06/20/2015   Procedure: Left Heart Cath and Cors/Grafts Angiography;  Surgeon: Larey Dresser, MD;  Location: [place] [facility];  Service: Cardiovascular;  Laterality: N/A;  CARDIAC CATHETERIZATION N/A 11/19/2015   Procedure: Left Heart Cath and Cors/Grafts Angiography;  Surgeon: Peter M Martinique, MD;  Location: [place] [facility];  Service: Cardiovascular;  Laterality: N/A;   CARDIAC CATHETERIZATION N/A 11/19/2015   Procedure: Coronary Stent Intervention;  Surgeon: Peter M Martinique, MD;  Location: [place] [facility];  Service: Cardiovascular;  Laterality: N/A;   CATARACT EXTRACTION W/ INTRAOCULAR  LENS  IMPLANT, BILATERAL Bilateral 1980s-1990s   CORONARY ANGIOPLASTY WITH STENT PLACEMENT  02/21/2015   "1 stent"   CORONARY ARTERY BYPASS GRAFT  05/2004   "CABG X4"   EYE SURGERY     LAPAROSCOPIC CHOLECYSTECTOMY  1990s   MASTECTOMY Left ~ 2012   PERIPHERAL VASCULAR CATHETERIZATION N/A 12/12/2014   Procedure: Abdominal Aortogram;  Surgeon: Wellington Hampshire, MD;  Location: [place] [facility];  Service: Cardiovascular;  Laterality: N/A;   RETINAL DETACHMENT SURGERY Right 1990s    Allergies  Allergies  Allergen Reactions   Niacin Other (See Comments)    burning   Duloxetine     Other reaction(s): Other (See Comments)   Amoxicillin Rash   Ibuprofen Rash   Lipitor [Atorvastatin] Other (See Comments)    Muscle pain    History of Present Illness    Taylor [name] is a 76 y.o. female who presents via audio/video conferencing for a telehealth visit today.  Pt was last seen in cardiology clinic on 05/12/2021 by Dr. Acie Fredrickson.  At that time Taylor [name] was doing well .  The patient is now pending procedure as outlined above. Since her last visit, she no chest pains, shortness of breath, or palpitations.  She gets around okay and is able to walk 1-2 blocks.  She is able to go up and down a flight of stairs without issues.  She does all her own housework and she does some yard work.  Because of this she scored a 5.62 METS on the DASI.  She meets minimum requirements of 4 METS.  She is okay to hold her Plavix x4 days prior to her procedure.  This means she will start holding Plavix on Sunday since her procedure is on Thursday.   Reports no shortness of breath nor dyspnea on exertion. Reports no chest pain, pressure, or tightness. No edema, orthopnea, PND. Reports no palpitations.    Home Medications    Prior to Admission medications   Medication Sig Start Date End Date Taking? Authorizing Provider  acetaminophen (TYLENOL) 500 MG tablet Take 1,000 mg by mouth every 6 (six) hours as needed for  headache (pain).     [provider]  ALPRAZolam Duanne Moron) 0.25 MG tablet Take 0.25 mg by mouth daily as needed for anxiety.     [provider]  aspirin 81 MG tablet Take 81 mg by mouth daily.    [provider]  clopidogrel (PLAVIX) 75 MG tablet Take 1 tablet (75 mg total) by mouth daily. 05/26/21   Nahser, Wonda Cheng, MD  glipiZIDE (GLUCOTROL) 5 MG tablet Take 10 mg by mouth daily. 05/06/21   [provider]  isosorbide mononitrate (IMDUR) 60 MG 24 hr tablet Take 1 tablet by mouth once daily 07/23/21   Nahser, Wonda Cheng, MD  lisinopril (ZESTRIL) 10 MG tablet Take 10 mg by mouth daily. 02/08/21   [provider]  metFORMIN (GLUCOPHAGE) 500 MG tablet Take 1,000 mg by mouth 2 (two) times daily with a meal.     [provider]  nitroGLYCERIN (NITROSTAT) 0.4 MG SL tablet Place  1 tablet (0.4 mg total) under the tongue every 5 (five) minutes as needed for chest pain. 12/13/18   Nahser, Wonda Cheng, MD  pioglitazone (ACTOS) 15 MG tablet Take 1 tablet by mouth daily. Patient not taking: Reported on 05/12/2021 02/22/20   [provider]  rosuvastatin (CRESTOR) 20 MG tablet TAKE ONE TABLET BY MOUTH ONCE DAILY AT  6PM 12/01/16   Nahser, Wonda Cheng, MD  sertraline (ZOLOFT) 100 MG tablet Take 100 mg by mouth daily.     [provider]  timolol (BETIMOL) 0.25 % ophthalmic solution 1-2 drops 2 (two) times daily.    [provider]    Physical Exam    Vital Signs:  Taylor [name] does not have vital signs available for review today.  Given telephonic nature of communication, physical exam is limited. AAOx3. NAD. Normal affect.  Speech and respirations are unlabored.  Accessory Clinical Findings    None  Assessment & Plan    1.  Preoperative Cardiovascular Risk Assessment:  Taylor [name] perioperative risk of a major cardiac event is 0.9% according to the Revised Cardiac Risk Index (RCRI).  Therefore, she is at low risk for perioperative  complications.   Her functional capacity is good at 5.62 METs according to the [place] Activity Status Index (DASI). Recommendations: According to ACC/AHA guidelines, no further cardiovascular testing needed.  The patient may proceed to surgery at acceptable risk.   Antiplatelet and/or Anticoagulation Recommendations: Clopidogrel (Plavix) can be held for 4 days prior to her surgery and resumed as soon as possible post op.   A copy of this note will be routed to requesting surgeon.  Time:   Today, I have spent 14 minutes with the patient with telehealth technology discussing medical history, symptoms, and management plan.     [name], PA-C  08/22/2021, 3:46 PM

## 2021-08-22 NOTE — Telephone Encounter (Signed)
I s/w Kim with requesting office for upcoming procedure for the pt.     Pre-operative Risk Assessment    Patient Name: Taylor [name]  DOB: 1945/11/22 MRN: [id number]      Request for Surgical Clearance    Procedure:   U/S GUIDED Bx  ; AXILLARY LYMPHADENOPATHY   Date of Surgery:  Clearance 08/28/21                                 Surgeon:  JESSICA ANN Apolinar Junes, FNP  Surgeon's Group or Practice Name:  [facility] Phone number:  [phone] Fax number:  [phone] ATTN: PAULA   Type of Clearance Requested:   - Medical  - Pharmacy:  Hold Clopidogrel (Plavix) x 4 DAYS PRIOR TO PROCEDURE   Type of Anesthesia:  None    Additional requests/questions:    [name]   08/22/2021, 1:23 PM

## 2021-08-22 NOTE — Telephone Encounter (Signed)
URGENT PRE OP ADD ON PER PRE OP PROVIDER..  Consent is done.   08/22/21 @ 3:40

## 2021-08-22 NOTE — Telephone Encounter (Signed)
   Name: Taylor [name]  DOB: 19-Nov-1945  MRN: [id number]  Primary Cardiologist: Mertie Moores, MD  Chart reviewed as part of pre-operative protocol coverage. Because of [name] past medical history and time since last visit, she will require a follow-up telephone visit in order to better assess preoperative cardiovascular risk.  Pre-op covering staff: - Please schedule appointment and call patient to inform them. If patient already had an upcoming appointment within acceptable timeframe, please add "pre-op clearance" to the appointment notes so provider is aware. - Please contact requesting surgeon's office via preferred method (i.e, phone, fax) to inform them of need for appointment prior to surgery.  Remote history of PCI back in 2018.  Per [facility] office protocol okay to hold Plavix x4 days prior to the procedure.  Please restart once medically safe to do so.  [name], PA-C  08/22/2021, 1:36 PM

## 2021-08-22 NOTE — Telephone Encounter (Signed)
Per Melina Copa, PAC to follow up on the notes from the pt yesterday. I called what I believed to be the office who may be performing her procedure. See notes from Dorathy Daft, [place] . I was able to confirm I had the right office, I s/w the secretary and she was not sure of the procedure to be done and she will have the NP call me back with the information that we will need to be able to proceed with a pre op clearance if needed. Left # [phone]. The states though the procedure is 08/28/21. I will update the pre op provider where this stands at this time.

## 2021-08-22 NOTE — Telephone Encounter (Signed)
URGENT PRE OP ADD ON PER PRE OP PROVIDER 08/22/21 @ 3:40. Consent is done     Patient Consent for Virtual Visit        Taylor [name] has provided verbal consent on 08/22/2021 for a virtual visit (video or telephone).   CONSENT FOR VIRTUAL VISIT FOR:  Taylor [name]  By participating in this virtual visit I agree to the following:  I hereby voluntarily request, consent and authorize [facility] and its employed or contracted physicians, physician assistants, nurse practitioners or other licensed health care professionals (the Practitioner), to provide me with telemedicine health care services (the "Services") as deemed necessary by the treating Practitioner. I acknowledge and consent to receive the Services by the Practitioner via telemedicine. I understand that the telemedicine visit will involve communicating with the Practitioner through live audiovisual communication technology and the disclosure of certain medical information by electronic transmission. I acknowledge that I have been given the opportunity to request an in-person assessment or other available alternative prior to the telemedicine visit and am voluntarily participating in the telemedicine visit.  I understand that I have the right to withhold or withdraw my consent to the use of telemedicine in the course of my care at any time, without affecting my right to future care or treatment, and that the Practitioner or I may terminate the telemedicine visit at any time. I understand that I have the right to inspect all information obtained and/or recorded in the course of the telemedicine visit and may receive copies of available information for a reasonable fee.  I understand that some of the potential risks of receiving the Services via telemedicine include:  Delay or interruption in medical evaluation due to technological equipment failure or disruption; Information transmitted may not be sufficient (e.g. poor resolution of  images) to allow for appropriate medical decision making by the Practitioner; and/or  In rare instances, security protocols could fail, causing a breach of personal health information.  Furthermore, I acknowledge that it is my responsibility to provide information about my medical history, conditions and care that is complete and accurate to the best of my ability. I acknowledge that Practitioner's advice, recommendations, and/or decision may be based on factors not within their control, such as incomplete or inaccurate data provided by me or distortions of diagnostic images or specimens that may result from electronic transmissions. I understand that the practice of medicine is not an exact science and that Practitioner makes no warranties or guarantees regarding treatment outcomes. I acknowledge that a copy of this consent can be made available to me via my patient portal ([place]), or I can request a printed copy by calling the office of [facility].    I understand that my insurance will be billed for this visit.   I have read or had this consent read to me. I understand the contents of this consent, which adequately explains the benefits and risks of the Services being provided via telemedicine.  I have been provided ample opportunity to ask questions regarding this consent and the Services and have had my questions answered to my satisfaction. I give my informed consent for the services to be provided through the use of telemedicine in my medical care

## 2021-08-28 DIAGNOSIS — C773 Secondary and unspecified malignant neoplasm of axilla and upper limb lymph nodes: Secondary | ICD-10-CM | POA: Diagnosis not present

## 2021-08-28 DIAGNOSIS — R590 Localized enlarged lymph nodes: Secondary | ICD-10-CM | POA: Diagnosis not present

## 2021-08-28 DIAGNOSIS — Z85820 Personal history of malignant melanoma of skin: Secondary | ICD-10-CM | POA: Diagnosis not present

## 2021-08-28 DIAGNOSIS — C4361 Malignant melanoma of right upper limb, including shoulder: Secondary | ICD-10-CM | POA: Diagnosis not present

## 2021-09-01 DIAGNOSIS — R590 Localized enlarged lymph nodes: Secondary | ICD-10-CM | POA: Diagnosis not present

## 2021-09-01 DIAGNOSIS — C4361 Malignant melanoma of right upper limb, including shoulder: Secondary | ICD-10-CM | POA: Diagnosis not present

## 2021-09-01 DIAGNOSIS — R911 Solitary pulmonary nodule: Secondary | ICD-10-CM | POA: Diagnosis not present

## 2021-09-03 DIAGNOSIS — C779 Secondary and unspecified malignant neoplasm of lymph node, unspecified: Secondary | ICD-10-CM | POA: Diagnosis not present

## 2021-09-03 DIAGNOSIS — C4361 Malignant melanoma of right upper limb, including shoulder: Secondary | ICD-10-CM | POA: Diagnosis not present

## 2021-09-03 DIAGNOSIS — Z79899 Other long term (current) drug therapy: Secondary | ICD-10-CM | POA: Diagnosis not present

## 2021-09-03 DIAGNOSIS — Z1159 Encounter for screening for other viral diseases: Secondary | ICD-10-CM | POA: Diagnosis not present

## 2021-09-03 DIAGNOSIS — C439 Malignant melanoma of skin, unspecified: Secondary | ICD-10-CM | POA: Diagnosis not present

## 2021-09-03 DIAGNOSIS — G893 Neoplasm related pain (acute) (chronic): Secondary | ICD-10-CM | POA: Diagnosis not present

## 2021-09-12 DIAGNOSIS — C4361 Malignant melanoma of right upper limb, including shoulder: Secondary | ICD-10-CM | POA: Diagnosis not present

## 2021-09-12 DIAGNOSIS — G319 Degenerative disease of nervous system, unspecified: Secondary | ICD-10-CM | POA: Diagnosis not present

## 2021-09-17 DIAGNOSIS — C4361 Malignant melanoma of right upper limb, including shoulder: Secondary | ICD-10-CM | POA: Diagnosis not present

## 2021-09-18 DIAGNOSIS — C4361 Malignant melanoma of right upper limb, including shoulder: Secondary | ICD-10-CM | POA: Diagnosis not present

## 2021-09-18 DIAGNOSIS — Z5112 Encounter for antineoplastic immunotherapy: Secondary | ICD-10-CM | POA: Diagnosis not present

## 2021-09-18 DIAGNOSIS — Z79899 Other long term (current) drug therapy: Secondary | ICD-10-CM | POA: Diagnosis not present

## 2021-09-18 DIAGNOSIS — Z1159 Encounter for screening for other viral diseases: Secondary | ICD-10-CM | POA: Diagnosis not present

## 2021-09-19 DIAGNOSIS — C4361 Malignant melanoma of right upper limb, including shoulder: Secondary | ICD-10-CM | POA: Diagnosis not present

## 2021-09-23 DIAGNOSIS — E1142 Type 2 diabetes mellitus with diabetic polyneuropathy: Secondary | ICD-10-CM | POA: Diagnosis not present

## 2021-09-23 DIAGNOSIS — R5383 Other fatigue: Secondary | ICD-10-CM | POA: Diagnosis not present

## 2021-09-23 DIAGNOSIS — F419 Anxiety disorder, unspecified: Secondary | ICD-10-CM | POA: Diagnosis not present

## 2021-10-08 DIAGNOSIS — Z1159 Encounter for screening for other viral diseases: Secondary | ICD-10-CM | POA: Diagnosis not present

## 2021-10-08 DIAGNOSIS — Z79899 Other long term (current) drug therapy: Secondary | ICD-10-CM | POA: Diagnosis not present

## 2021-10-08 DIAGNOSIS — Z5112 Encounter for antineoplastic immunotherapy: Secondary | ICD-10-CM | POA: Diagnosis not present

## 2021-10-08 DIAGNOSIS — R197 Diarrhea, unspecified: Secondary | ICD-10-CM | POA: Diagnosis not present

## 2021-10-08 DIAGNOSIS — L270 Generalized skin eruption due to drugs and medicaments taken internally: Secondary | ICD-10-CM | POA: Diagnosis not present

## 2021-10-08 DIAGNOSIS — C773 Secondary and unspecified malignant neoplasm of axilla and upper limb lymph nodes: Secondary | ICD-10-CM | POA: Diagnosis not present

## 2021-10-08 DIAGNOSIS — E785 Hyperlipidemia, unspecified: Secondary | ICD-10-CM | POA: Diagnosis not present

## 2021-10-08 DIAGNOSIS — Z7982 Long term (current) use of aspirin: Secondary | ICD-10-CM | POA: Diagnosis not present

## 2021-10-08 DIAGNOSIS — R21 Rash and other nonspecific skin eruption: Secondary | ICD-10-CM | POA: Diagnosis not present

## 2021-10-08 DIAGNOSIS — G893 Neoplasm related pain (acute) (chronic): Secondary | ICD-10-CM | POA: Diagnosis not present

## 2021-10-08 DIAGNOSIS — Z87891 Personal history of nicotine dependence: Secondary | ICD-10-CM | POA: Diagnosis not present

## 2021-10-08 DIAGNOSIS — Z951 Presence of aortocoronary bypass graft: Secondary | ICD-10-CM | POA: Diagnosis not present

## 2021-10-08 DIAGNOSIS — I10 Essential (primary) hypertension: Secondary | ICD-10-CM | POA: Diagnosis not present

## 2021-10-08 DIAGNOSIS — I2510 Atherosclerotic heart disease of native coronary artery without angina pectoris: Secondary | ICD-10-CM | POA: Diagnosis not present

## 2021-10-08 DIAGNOSIS — C4361 Malignant melanoma of right upper limb, including shoulder: Secondary | ICD-10-CM | POA: Diagnosis not present

## 2021-10-08 DIAGNOSIS — Z7902 Long term (current) use of antithrombotics/antiplatelets: Secondary | ICD-10-CM | POA: Diagnosis not present

## 2021-10-08 DIAGNOSIS — E119 Type 2 diabetes mellitus without complications: Secondary | ICD-10-CM | POA: Diagnosis not present

## 2021-10-29 DIAGNOSIS — Z7982 Long term (current) use of aspirin: Secondary | ICD-10-CM | POA: Diagnosis not present

## 2021-10-29 DIAGNOSIS — Z5112 Encounter for antineoplastic immunotherapy: Secondary | ICD-10-CM | POA: Diagnosis not present

## 2021-10-29 DIAGNOSIS — E11622 Type 2 diabetes mellitus with other skin ulcer: Secondary | ICD-10-CM | POA: Diagnosis not present

## 2021-10-29 DIAGNOSIS — Z1159 Encounter for screening for other viral diseases: Secondary | ICD-10-CM | POA: Diagnosis not present

## 2021-10-29 DIAGNOSIS — I10 Essential (primary) hypertension: Secondary | ICD-10-CM | POA: Diagnosis not present

## 2021-10-29 DIAGNOSIS — R21 Rash and other nonspecific skin eruption: Secondary | ICD-10-CM | POA: Diagnosis not present

## 2021-10-29 DIAGNOSIS — Z86711 Personal history of pulmonary embolism: Secondary | ICD-10-CM | POA: Diagnosis not present

## 2021-10-29 DIAGNOSIS — R197 Diarrhea, unspecified: Secondary | ICD-10-CM | POA: Diagnosis not present

## 2021-10-29 DIAGNOSIS — C4361 Malignant melanoma of right upper limb, including shoulder: Secondary | ICD-10-CM | POA: Diagnosis not present

## 2021-10-29 DIAGNOSIS — Z79899 Other long term (current) drug therapy: Secondary | ICD-10-CM | POA: Diagnosis not present

## 2021-10-29 DIAGNOSIS — G893 Neoplasm related pain (acute) (chronic): Secondary | ICD-10-CM | POA: Diagnosis not present

## 2021-10-29 DIAGNOSIS — Z87891 Personal history of nicotine dependence: Secondary | ICD-10-CM | POA: Diagnosis not present

## 2021-10-29 DIAGNOSIS — C773 Secondary and unspecified malignant neoplasm of axilla and upper limb lymph nodes: Secondary | ICD-10-CM | POA: Diagnosis not present

## 2021-10-29 DIAGNOSIS — E785 Hyperlipidemia, unspecified: Secondary | ICD-10-CM | POA: Diagnosis not present

## 2021-10-29 DIAGNOSIS — Z951 Presence of aortocoronary bypass graft: Secondary | ICD-10-CM | POA: Diagnosis not present

## 2021-10-29 DIAGNOSIS — Z7902 Long term (current) use of antithrombotics/antiplatelets: Secondary | ICD-10-CM | POA: Diagnosis not present

## 2021-10-29 DIAGNOSIS — L98499 Non-pressure chronic ulcer of skin of other sites with unspecified severity: Secondary | ICD-10-CM | POA: Diagnosis not present

## 2021-10-31 ENCOUNTER — Other Ambulatory Visit: Payer: Self-pay | Admitting: Family Medicine

## 2021-10-31 DIAGNOSIS — Z1231 Encounter for screening mammogram for malignant neoplasm of breast: Secondary | ICD-10-CM

## 2021-11-11 DIAGNOSIS — I700 Atherosclerosis of aorta: Secondary | ICD-10-CM | POA: Diagnosis not present

## 2021-11-11 DIAGNOSIS — M4316 Spondylolisthesis, lumbar region: Secondary | ICD-10-CM | POA: Diagnosis not present

## 2021-11-11 DIAGNOSIS — M47816 Spondylosis without myelopathy or radiculopathy, lumbar region: Secondary | ICD-10-CM | POA: Diagnosis not present

## 2021-11-11 DIAGNOSIS — R911 Solitary pulmonary nodule: Secondary | ICD-10-CM | POA: Diagnosis not present

## 2021-11-11 DIAGNOSIS — C4361 Malignant melanoma of right upper limb, including shoulder: Secondary | ICD-10-CM | POA: Diagnosis not present

## 2021-11-19 DIAGNOSIS — C4361 Malignant melanoma of right upper limb, including shoulder: Secondary | ICD-10-CM | POA: Diagnosis not present

## 2021-11-19 DIAGNOSIS — Z79899 Other long term (current) drug therapy: Secondary | ICD-10-CM | POA: Diagnosis not present

## 2021-11-19 DIAGNOSIS — Z1159 Encounter for screening for other viral diseases: Secondary | ICD-10-CM | POA: Diagnosis not present

## 2021-11-25 DIAGNOSIS — Z23 Encounter for immunization: Secondary | ICD-10-CM | POA: Diagnosis not present

## 2021-12-03 DIAGNOSIS — Z85820 Personal history of malignant melanoma of skin: Secondary | ICD-10-CM | POA: Diagnosis not present

## 2021-12-03 DIAGNOSIS — Z1283 Encounter for screening for malignant neoplasm of skin: Secondary | ICD-10-CM | POA: Diagnosis not present

## 2021-12-03 DIAGNOSIS — Z08 Encounter for follow-up examination after completed treatment for malignant neoplasm: Secondary | ICD-10-CM | POA: Diagnosis not present

## 2021-12-03 DIAGNOSIS — L820 Inflamed seborrheic keratosis: Secondary | ICD-10-CM | POA: Diagnosis not present

## 2021-12-10 DIAGNOSIS — F419 Anxiety disorder, unspecified: Secondary | ICD-10-CM | POA: Diagnosis not present

## 2021-12-10 DIAGNOSIS — K629 Disease of anus and rectum, unspecified: Secondary | ICD-10-CM | POA: Diagnosis not present

## 2021-12-10 DIAGNOSIS — E785 Hyperlipidemia, unspecified: Secondary | ICD-10-CM | POA: Diagnosis not present

## 2021-12-10 DIAGNOSIS — Z8679 Personal history of other diseases of the circulatory system: Secondary | ICD-10-CM | POA: Diagnosis not present

## 2021-12-10 DIAGNOSIS — R197 Diarrhea, unspecified: Secondary | ICD-10-CM | POA: Diagnosis not present

## 2021-12-10 DIAGNOSIS — L270 Generalized skin eruption due to drugs and medicaments taken internally: Secondary | ICD-10-CM | POA: Diagnosis not present

## 2021-12-10 DIAGNOSIS — Z853 Personal history of malignant neoplasm of breast: Secondary | ICD-10-CM | POA: Diagnosis not present

## 2021-12-10 DIAGNOSIS — F32A Depression, unspecified: Secondary | ICD-10-CM | POA: Diagnosis not present

## 2021-12-10 DIAGNOSIS — L918 Other hypertrophic disorders of the skin: Secondary | ICD-10-CM | POA: Diagnosis not present

## 2021-12-10 DIAGNOSIS — C4361 Malignant melanoma of right upper limb, including shoulder: Secondary | ICD-10-CM | POA: Diagnosis not present

## 2021-12-10 DIAGNOSIS — R21 Rash and other nonspecific skin eruption: Secondary | ICD-10-CM | POA: Diagnosis not present

## 2021-12-10 DIAGNOSIS — Z79899 Other long term (current) drug therapy: Secondary | ICD-10-CM | POA: Diagnosis not present

## 2021-12-10 DIAGNOSIS — I2510 Atherosclerotic heart disease of native coronary artery without angina pectoris: Secondary | ICD-10-CM | POA: Diagnosis not present

## 2021-12-10 DIAGNOSIS — I10 Essential (primary) hypertension: Secondary | ICD-10-CM | POA: Diagnosis not present

## 2021-12-10 DIAGNOSIS — R911 Solitary pulmonary nodule: Secondary | ICD-10-CM | POA: Diagnosis not present

## 2021-12-10 DIAGNOSIS — Z5112 Encounter for antineoplastic immunotherapy: Secondary | ICD-10-CM | POA: Diagnosis not present

## 2021-12-10 DIAGNOSIS — F17210 Nicotine dependence, cigarettes, uncomplicated: Secondary | ICD-10-CM | POA: Diagnosis not present

## 2021-12-10 DIAGNOSIS — Z7902 Long term (current) use of antithrombotics/antiplatelets: Secondary | ICD-10-CM | POA: Diagnosis not present

## 2021-12-10 DIAGNOSIS — Z7982 Long term (current) use of aspirin: Secondary | ICD-10-CM | POA: Diagnosis not present

## 2021-12-10 DIAGNOSIS — E119 Type 2 diabetes mellitus without complications: Secondary | ICD-10-CM | POA: Diagnosis not present

## 2021-12-10 DIAGNOSIS — Z951 Presence of aortocoronary bypass graft: Secondary | ICD-10-CM | POA: Diagnosis not present

## 2021-12-23 ENCOUNTER — Ambulatory Visit
Admission: RE | Admit: 2021-12-23 | Discharge: 2021-12-23 | Disposition: A | Discharge status: Home / Self Care | Payer: Medicare Other | Source: Ambulatory Visit | Attending: Family Medicine | Admitting: Family Medicine

## 2021-12-23 DIAGNOSIS — Z1231 Encounter for screening mammogram for malignant neoplasm of breast: Secondary | ICD-10-CM

## 2021-12-31 DIAGNOSIS — Z79899 Other long term (current) drug therapy: Secondary | ICD-10-CM | POA: Diagnosis not present

## 2021-12-31 DIAGNOSIS — Z1159 Encounter for screening for other viral diseases: Secondary | ICD-10-CM | POA: Diagnosis not present

## 2021-12-31 DIAGNOSIS — C4361 Malignant melanoma of right upper limb, including shoulder: Secondary | ICD-10-CM | POA: Diagnosis not present

## 2021-12-31 DIAGNOSIS — Z5112 Encounter for antineoplastic immunotherapy: Secondary | ICD-10-CM | POA: Diagnosis not present

## 2022-01-21 DIAGNOSIS — F419 Anxiety disorder, unspecified: Secondary | ICD-10-CM | POA: Diagnosis not present

## 2022-01-21 DIAGNOSIS — K521 Toxic gastroenteritis and colitis: Secondary | ICD-10-CM | POA: Diagnosis not present

## 2022-01-21 DIAGNOSIS — Z951 Presence of aortocoronary bypass graft: Secondary | ICD-10-CM | POA: Diagnosis not present

## 2022-01-21 DIAGNOSIS — F32A Depression, unspecified: Secondary | ICD-10-CM | POA: Diagnosis not present

## 2022-01-21 DIAGNOSIS — G893 Neoplasm related pain (acute) (chronic): Secondary | ICD-10-CM | POA: Diagnosis not present

## 2022-01-21 DIAGNOSIS — Z79899 Other long term (current) drug therapy: Secondary | ICD-10-CM | POA: Diagnosis not present

## 2022-01-21 DIAGNOSIS — E039 Hypothyroidism, unspecified: Secondary | ICD-10-CM | POA: Diagnosis not present

## 2022-01-21 DIAGNOSIS — E119 Type 2 diabetes mellitus without complications: Secondary | ICD-10-CM | POA: Diagnosis not present

## 2022-01-21 DIAGNOSIS — R21 Rash and other nonspecific skin eruption: Secondary | ICD-10-CM | POA: Diagnosis not present

## 2022-01-21 DIAGNOSIS — I10 Essential (primary) hypertension: Secondary | ICD-10-CM | POA: Diagnosis not present

## 2022-01-21 DIAGNOSIS — E785 Hyperlipidemia, unspecified: Secondary | ICD-10-CM | POA: Diagnosis not present

## 2022-01-21 DIAGNOSIS — R911 Solitary pulmonary nodule: Secondary | ICD-10-CM | POA: Diagnosis not present

## 2022-01-21 DIAGNOSIS — I2510 Atherosclerotic heart disease of native coronary artery without angina pectoris: Secondary | ICD-10-CM | POA: Diagnosis not present

## 2022-01-21 DIAGNOSIS — C4361 Malignant melanoma of right upper limb, including shoulder: Secondary | ICD-10-CM | POA: Diagnosis not present

## 2022-01-21 DIAGNOSIS — Z5112 Encounter for antineoplastic immunotherapy: Secondary | ICD-10-CM | POA: Diagnosis not present

## 2022-01-21 DIAGNOSIS — Z1159 Encounter for screening for other viral diseases: Secondary | ICD-10-CM | POA: Diagnosis not present

## 2022-01-21 DIAGNOSIS — F17210 Nicotine dependence, cigarettes, uncomplicated: Secondary | ICD-10-CM | POA: Diagnosis not present

## 2022-01-21 DIAGNOSIS — R197 Diarrhea, unspecified: Secondary | ICD-10-CM | POA: Diagnosis not present

## 2022-02-11 DIAGNOSIS — I10 Essential (primary) hypertension: Secondary | ICD-10-CM | POA: Diagnosis not present

## 2022-02-11 DIAGNOSIS — Z87891 Personal history of nicotine dependence: Secondary | ICD-10-CM | POA: Diagnosis not present

## 2022-02-11 DIAGNOSIS — R197 Diarrhea, unspecified: Secondary | ICD-10-CM | POA: Diagnosis not present

## 2022-02-11 DIAGNOSIS — E039 Hypothyroidism, unspecified: Secondary | ICD-10-CM | POA: Diagnosis not present

## 2022-02-11 DIAGNOSIS — C4361 Malignant melanoma of right upper limb, including shoulder: Secondary | ICD-10-CM | POA: Diagnosis not present

## 2022-02-11 DIAGNOSIS — K629 Disease of anus and rectum, unspecified: Secondary | ICD-10-CM | POA: Diagnosis not present

## 2022-02-11 DIAGNOSIS — Z7902 Long term (current) use of antithrombotics/antiplatelets: Secondary | ICD-10-CM | POA: Diagnosis not present

## 2022-02-11 DIAGNOSIS — R911 Solitary pulmonary nodule: Secondary | ICD-10-CM | POA: Diagnosis not present

## 2022-02-11 DIAGNOSIS — R21 Rash and other nonspecific skin eruption: Secondary | ICD-10-CM | POA: Diagnosis not present

## 2022-02-11 DIAGNOSIS — E785 Hyperlipidemia, unspecified: Secondary | ICD-10-CM | POA: Diagnosis not present

## 2022-02-11 DIAGNOSIS — Z5112 Encounter for antineoplastic immunotherapy: Secondary | ICD-10-CM | POA: Diagnosis not present

## 2022-02-11 DIAGNOSIS — C773 Secondary and unspecified malignant neoplasm of axilla and upper limb lymph nodes: Secondary | ICD-10-CM | POA: Diagnosis not present

## 2022-02-11 DIAGNOSIS — Z79899 Other long term (current) drug therapy: Secondary | ICD-10-CM | POA: Diagnosis not present

## 2022-02-11 DIAGNOSIS — G893 Neoplasm related pain (acute) (chronic): Secondary | ICD-10-CM | POA: Diagnosis not present

## 2022-02-11 DIAGNOSIS — Z7982 Long term (current) use of aspirin: Secondary | ICD-10-CM | POA: Diagnosis not present

## 2022-02-11 DIAGNOSIS — Z1159 Encounter for screening for other viral diseases: Secondary | ICD-10-CM | POA: Diagnosis not present

## 2022-02-11 DIAGNOSIS — I2510 Atherosclerotic heart disease of native coronary artery without angina pectoris: Secondary | ICD-10-CM | POA: Diagnosis not present

## 2022-02-20 DIAGNOSIS — C4361 Malignant melanoma of right upper limb, including shoulder: Secondary | ICD-10-CM | POA: Diagnosis not present

## 2022-02-23 ENCOUNTER — Telehealth: Payer: Self-pay | Admitting: Cardiovascular Disease

## 2022-02-23 DIAGNOSIS — Z6827 Body mass index (BMI) 27.0-27.9, adult: Secondary | ICD-10-CM | POA: Diagnosis not present

## 2022-02-23 DIAGNOSIS — R059 Cough, unspecified: Secondary | ICD-10-CM | POA: Diagnosis not present

## 2022-02-23 DIAGNOSIS — Z03818 Encounter for observation for suspected exposure to other biological agents ruled out: Secondary | ICD-10-CM | POA: Diagnosis not present

## 2022-02-23 NOTE — Telephone Encounter (Signed)
Patient's husband states the patient was diagnosed with RSV and they would like to know if she is okay to take over the counter medication and if so what should she take.

## 2022-02-24 NOTE — Telephone Encounter (Signed)
Advised pt after checking [employer] database, that she can safely take Fexofenadine for runny nose and mild cough, use cough drops/lozenges, tylenol for pain, stay hydrated and attempt to eat. Recommended bone broth to start with for increased protein. No further questions.

## 2022-02-24 NOTE — Telephone Encounter (Signed)
Returned call to patient to inquire on what symptoms she is experiencing. Runny nose, decreased appetite, mild, non-productive cough, no fever.

## 2022-03-02 DIAGNOSIS — I10 Essential (primary) hypertension: Secondary | ICD-10-CM | POA: Diagnosis not present

## 2022-03-02 DIAGNOSIS — E1140 Type 2 diabetes mellitus with diabetic neuropathy, unspecified: Secondary | ICD-10-CM | POA: Diagnosis not present

## 2022-03-02 DIAGNOSIS — I2699 Other pulmonary embolism without acute cor pulmonale: Secondary | ICD-10-CM | POA: Diagnosis not present

## 2022-03-02 DIAGNOSIS — I2510 Atherosclerotic heart disease of native coronary artery without angina pectoris: Secondary | ICD-10-CM | POA: Diagnosis not present

## 2022-03-02 DIAGNOSIS — C4361 Malignant melanoma of right upper limb, including shoulder: Secondary | ICD-10-CM | POA: Diagnosis not present

## 2022-03-02 DIAGNOSIS — I5032 Chronic diastolic (congestive) heart failure: Secondary | ICD-10-CM | POA: Diagnosis not present

## 2022-03-02 DIAGNOSIS — G4733 Obstructive sleep apnea (adult) (pediatric): Secondary | ICD-10-CM | POA: Diagnosis not present

## 2022-03-02 DIAGNOSIS — F419 Anxiety disorder, unspecified: Secondary | ICD-10-CM | POA: Diagnosis not present

## 2022-03-02 DIAGNOSIS — F32A Depression, unspecified: Secondary | ICD-10-CM | POA: Diagnosis not present

## 2022-03-02 DIAGNOSIS — E039 Hypothyroidism, unspecified: Secondary | ICD-10-CM | POA: Diagnosis not present

## 2022-03-05 DIAGNOSIS — E119 Type 2 diabetes mellitus without complications: Secondary | ICD-10-CM | POA: Diagnosis not present

## 2022-03-05 DIAGNOSIS — I2510 Atherosclerotic heart disease of native coronary artery without angina pectoris: Secondary | ICD-10-CM | POA: Diagnosis not present

## 2022-03-05 DIAGNOSIS — Z85820 Personal history of malignant melanoma of skin: Secondary | ICD-10-CM | POA: Diagnosis not present

## 2022-03-05 DIAGNOSIS — Z87891 Personal history of nicotine dependence: Secondary | ICD-10-CM | POA: Diagnosis not present

## 2022-03-05 DIAGNOSIS — I10 Essential (primary) hypertension: Secondary | ICD-10-CM | POA: Diagnosis not present

## 2022-03-05 DIAGNOSIS — C7989 Secondary malignant neoplasm of other specified sites: Secondary | ICD-10-CM | POA: Diagnosis not present

## 2022-03-05 DIAGNOSIS — Z79890 Hormone replacement therapy (postmenopausal): Secondary | ICD-10-CM | POA: Diagnosis not present

## 2022-03-05 DIAGNOSIS — C773 Secondary and unspecified malignant neoplasm of axilla and upper limb lymph nodes: Secondary | ICD-10-CM | POA: Diagnosis not present

## 2022-03-05 DIAGNOSIS — Z7984 Long term (current) use of oral hypoglycemic drugs: Secondary | ICD-10-CM | POA: Diagnosis not present

## 2022-03-05 DIAGNOSIS — Z7902 Long term (current) use of antithrombotics/antiplatelets: Secondary | ICD-10-CM | POA: Diagnosis not present

## 2022-03-05 DIAGNOSIS — C439 Malignant melanoma of skin, unspecified: Secondary | ICD-10-CM | POA: Diagnosis not present

## 2022-03-05 DIAGNOSIS — Z7982 Long term (current) use of aspirin: Secondary | ICD-10-CM | POA: Diagnosis not present

## 2022-03-05 DIAGNOSIS — C4359 Malignant melanoma of other part of trunk: Secondary | ICD-10-CM | POA: Diagnosis not present

## 2022-03-13 DIAGNOSIS — C4361 Malignant melanoma of right upper limb, including shoulder: Secondary | ICD-10-CM | POA: Diagnosis not present

## 2022-03-13 DIAGNOSIS — Z09 Encounter for follow-up examination after completed treatment for conditions other than malignant neoplasm: Secondary | ICD-10-CM | POA: Diagnosis not present

## 2022-03-30 DIAGNOSIS — Z79899 Other long term (current) drug therapy: Secondary | ICD-10-CM | POA: Diagnosis not present

## 2022-03-30 DIAGNOSIS — E039 Hypothyroidism, unspecified: Secondary | ICD-10-CM | POA: Diagnosis not present

## 2022-03-30 DIAGNOSIS — C4361 Malignant melanoma of right upper limb, including shoulder: Secondary | ICD-10-CM | POA: Diagnosis not present

## 2022-04-17 DIAGNOSIS — E1142 Type 2 diabetes mellitus with diabetic polyneuropathy: Secondary | ICD-10-CM | POA: Diagnosis not present

## 2022-04-17 DIAGNOSIS — Z0000 Encounter for general adult medical examination without abnormal findings: Secondary | ICD-10-CM | POA: Diagnosis not present

## 2022-04-17 DIAGNOSIS — F419 Anxiety disorder, unspecified: Secondary | ICD-10-CM | POA: Diagnosis not present

## 2022-04-17 DIAGNOSIS — I10 Essential (primary) hypertension: Secondary | ICD-10-CM | POA: Diagnosis not present

## 2022-04-17 DIAGNOSIS — R194 Change in bowel habit: Secondary | ICD-10-CM | POA: Diagnosis not present

## 2022-04-17 DIAGNOSIS — I739 Peripheral vascular disease, unspecified: Secondary | ICD-10-CM | POA: Diagnosis not present

## 2022-04-17 DIAGNOSIS — R9389 Abnormal findings on diagnostic imaging of other specified body structures: Secondary | ICD-10-CM | POA: Diagnosis not present

## 2022-04-17 DIAGNOSIS — N183 Chronic kidney disease, stage 3 unspecified: Secondary | ICD-10-CM | POA: Diagnosis not present

## 2022-04-17 DIAGNOSIS — E7800 Pure hypercholesterolemia, unspecified: Secondary | ICD-10-CM | POA: Diagnosis not present

## 2022-04-17 DIAGNOSIS — Z6827 Body mass index (BMI) 27.0-27.9, adult: Secondary | ICD-10-CM | POA: Diagnosis not present

## 2022-04-20 ENCOUNTER — Telehealth: Payer: Self-pay | Admitting: *Deleted

## 2022-04-20 NOTE — Telephone Encounter (Signed)
   Pre-operative Risk Assessment    Patient Name: Taylor [name]  DOB: Jan 05, 1946 MRN: [id number]      Request for Surgical Clearance    Procedure:   COLONOSCOPY  Date of Surgery:  Clearance 05/20/22                                 Surgeon:  DR. Romie Jumper Surgeon's Group or Practice Name:  [facility] Phone number:  [phone] Fax number:  [phone]   Type of Clearance Requested:   - Medical  - Pharmacy:  Hold Aspirin and Clopidogrel (Plavix) NOT INDICATED HOW LONG   Type of Anesthesia:   PROPOFOL   Additional requests/questions:    [name]   04/20/2022, 11:20 AM

## 2022-04-21 ENCOUNTER — Telehealth: Payer: Self-pay

## 2022-04-21 NOTE — Telephone Encounter (Signed)
Spoke with patient who is agreeable to do tele visit on 4/17 at 10 am. Med rec and consent have been done.

## 2022-04-21 NOTE — Telephone Encounter (Signed)
  Patient Consent for Virtual Visit        Taylor [name] has provided verbal consent on 04/21/2022 for a virtual visit (video or telephone).   CONSENT FOR VIRTUAL VISIT FOR:  Taylor [name]  By participating in this virtual visit I agree to the following:  I hereby voluntarily request, consent and authorize [facility] and its employed or contracted physicians, physician assistants, nurse practitioners or other licensed health care professionals (the Practitioner), to provide me with telemedicine health care services (the "Services") as deemed necessary by the treating Practitioner. I acknowledge and consent to receive the Services by the Practitioner via telemedicine. I understand that the telemedicine visit will involve communicating with the Practitioner through live audiovisual communication technology and the disclosure of certain medical information by electronic transmission. I acknowledge that I have been given the opportunity to request an in-person assessment or other available alternative prior to the telemedicine visit and am voluntarily participating in the telemedicine visit.  I understand that I have the right to withhold or withdraw my consent to the use of telemedicine in the course of my care at any time, without affecting my right to future care or treatment, and that the Practitioner or I may terminate the telemedicine visit at any time. I understand that I have the right to inspect all information obtained and/or recorded in the course of the telemedicine visit and may receive copies of available information for a reasonable fee.  I understand that some of the potential risks of receiving the Services via telemedicine include:  Delay or interruption in medical evaluation due to technological equipment failure or disruption; Information transmitted may not be sufficient (e.g. poor resolution of images) to allow for appropriate medical decision making by the  Practitioner; and/or  In rare instances, security protocols could fail, causing a breach of personal health information.  Furthermore, I acknowledge that it is my responsibility to provide information about my medical history, conditions and care that is complete and accurate to the best of my ability. I acknowledge that Practitioner's advice, recommendations, and/or decision may be based on factors not within their control, such as incomplete or inaccurate data provided by me or distortions of diagnostic images or specimens that may result from electronic transmissions. I understand that the practice of medicine is not an exact science and that Practitioner makes no warranties or guarantees regarding treatment outcomes. I acknowledge that a copy of this consent can be made available to me via my patient portal [facility] MyChart), or I can request a printed copy by calling the office of [facility].    I understand that my insurance will be billed for this visit.   I have read or had this consent read to me. I understand the contents of this consent, which adequately explains the benefits and risks of the Services being provided via telemedicine.  I have been provided ample opportunity to ask questions regarding this consent and the Services and have had my questions answered to my satisfaction. I give my informed consent for the services to be provided through the use of telemedicine in my medical care

## 2022-04-21 NOTE — Telephone Encounter (Signed)
   Name: Taylor [name]  DOB: Apr 17, 1945  MRN: [id number]  Primary Cardiologist: Kristeen Miss, MD   Preoperative team, please contact this patient and set up a phone call appointment for further preoperative risk assessment. Please obtain consent and complete medication review. Thank you for your help.  I confirm that guidance regarding antiplatelet and oral anticoagulation therapy has been completed and, if necessary, noted below.  Her Plavix may be held for 5 days prior to her procedure.  Her aspirin should be continued throughout her procedure.  Please resume Plavix as soon as hemostasis is achieved.   Ronney Asters, NP 04/21/2022, 9:54 AM [facility]

## 2022-04-24 ENCOUNTER — Emergency Department (HOSPITAL_COMMUNITY): Payer: Medicare Other

## 2022-04-24 ENCOUNTER — Inpatient Hospital Stay (HOSPITAL_COMMUNITY)
Admission: EM | Admit: 2022-04-24 | Discharge: 2022-05-01 | DRG: 200 | Disposition: A | Discharge status: Rehabilitation Facility | Payer: Medicare Other | Attending: Surgery | Admitting: Surgery

## 2022-04-24 ENCOUNTER — Other Ambulatory Visit: Payer: Self-pay

## 2022-04-24 ENCOUNTER — Observation Stay (HOSPITAL_COMMUNITY): Payer: Medicare Other

## 2022-04-24 ENCOUNTER — Encounter (HOSPITAL_COMMUNITY): Payer: Self-pay

## 2022-04-24 DIAGNOSIS — S32019A Unspecified fracture of first lumbar vertebra, initial encounter for closed fracture: Secondary | ICD-10-CM | POA: Diagnosis present

## 2022-04-24 DIAGNOSIS — S50312A Abrasion of left elbow, initial encounter: Secondary | ICD-10-CM | POA: Diagnosis present

## 2022-04-24 DIAGNOSIS — E7800 Pure hypercholesterolemia, unspecified: Secondary | ICD-10-CM | POA: Diagnosis present

## 2022-04-24 DIAGNOSIS — S22089D Unspecified fracture of T11-T12 vertebra, subsequent encounter for fracture with routine healing: Secondary | ICD-10-CM | POA: Diagnosis not present

## 2022-04-24 DIAGNOSIS — S32029A Unspecified fracture of second lumbar vertebra, initial encounter for closed fracture: Secondary | ICD-10-CM | POA: Diagnosis present

## 2022-04-24 DIAGNOSIS — K5901 Slow transit constipation: Secondary | ICD-10-CM | POA: Diagnosis not present

## 2022-04-24 DIAGNOSIS — G4700 Insomnia, unspecified: Secondary | ICD-10-CM | POA: Diagnosis present

## 2022-04-24 DIAGNOSIS — M1812 Unilateral primary osteoarthritis of first carpometacarpal joint, left hand: Secondary | ICD-10-CM | POA: Diagnosis not present

## 2022-04-24 DIAGNOSIS — S22000A Wedge compression fracture of unspecified thoracic vertebra, initial encounter for closed fracture: Secondary | ICD-10-CM

## 2022-04-24 DIAGNOSIS — S32018A Other fracture of first lumbar vertebra, initial encounter for closed fracture: Secondary | ICD-10-CM | POA: Diagnosis not present

## 2022-04-24 DIAGNOSIS — R7401 Elevation of levels of liver transaminase levels: Secondary | ICD-10-CM | POA: Diagnosis not present

## 2022-04-24 DIAGNOSIS — E1165 Type 2 diabetes mellitus with hyperglycemia: Secondary | ICD-10-CM | POA: Diagnosis present

## 2022-04-24 DIAGNOSIS — S60812A Abrasion of left wrist, initial encounter: Secondary | ICD-10-CM | POA: Diagnosis present

## 2022-04-24 DIAGNOSIS — N179 Acute kidney failure, unspecified: Secondary | ICD-10-CM | POA: Diagnosis not present

## 2022-04-24 DIAGNOSIS — S2241XA Multiple fractures of ribs, right side, initial encounter for closed fracture: Secondary | ICD-10-CM | POA: Diagnosis not present

## 2022-04-24 DIAGNOSIS — Z7982 Long term (current) use of aspirin: Secondary | ICD-10-CM

## 2022-04-24 DIAGNOSIS — Z880 Allergy status to penicillin: Secondary | ICD-10-CM

## 2022-04-24 DIAGNOSIS — E1151 Type 2 diabetes mellitus with diabetic peripheral angiopathy without gangrene: Secondary | ICD-10-CM | POA: Diagnosis present

## 2022-04-24 DIAGNOSIS — Z951 Presence of aortocoronary bypass graft: Secondary | ICD-10-CM | POA: Diagnosis not present

## 2022-04-24 DIAGNOSIS — W133XXA Fall through floor, initial encounter: Secondary | ICD-10-CM | POA: Diagnosis present

## 2022-04-24 DIAGNOSIS — Z9012 Acquired absence of left breast and nipple: Secondary | ICD-10-CM

## 2022-04-24 DIAGNOSIS — S2249XA Multiple fractures of ribs, unspecified side, initial encounter for closed fracture: Secondary | ICD-10-CM | POA: Diagnosis present

## 2022-04-24 DIAGNOSIS — Z7984 Long term (current) use of oral hypoglycemic drugs: Secondary | ICD-10-CM

## 2022-04-24 DIAGNOSIS — T07XXXA Unspecified multiple injuries, initial encounter: Secondary | ICD-10-CM | POA: Diagnosis not present

## 2022-04-24 DIAGNOSIS — E1122 Type 2 diabetes mellitus with diabetic chronic kidney disease: Secondary | ICD-10-CM | POA: Diagnosis not present

## 2022-04-24 DIAGNOSIS — Z79890 Hormone replacement therapy (postmenopausal): Secondary | ICD-10-CM

## 2022-04-24 DIAGNOSIS — Z87891 Personal history of nicotine dependence: Secondary | ICD-10-CM | POA: Diagnosis not present

## 2022-04-24 DIAGNOSIS — Z79899 Other long term (current) drug therapy: Secondary | ICD-10-CM

## 2022-04-24 DIAGNOSIS — Z85820 Personal history of malignant melanoma of skin: Secondary | ICD-10-CM | POA: Diagnosis not present

## 2022-04-24 DIAGNOSIS — Z853 Personal history of malignant neoplasm of breast: Secondary | ICD-10-CM | POA: Diagnosis not present

## 2022-04-24 DIAGNOSIS — N189 Chronic kidney disease, unspecified: Secondary | ICD-10-CM | POA: Diagnosis present

## 2022-04-24 DIAGNOSIS — D62 Acute posthemorrhagic anemia: Secondary | ICD-10-CM | POA: Diagnosis not present

## 2022-04-24 DIAGNOSIS — I2510 Atherosclerotic heart disease of native coronary artery without angina pectoris: Secondary | ICD-10-CM | POA: Diagnosis present

## 2022-04-24 DIAGNOSIS — S270XXA Traumatic pneumothorax, initial encounter: Principal | ICD-10-CM | POA: Diagnosis present

## 2022-04-24 DIAGNOSIS — Z7902 Long term (current) use of antithrombotics/antiplatelets: Secondary | ICD-10-CM

## 2022-04-24 DIAGNOSIS — S2231XA Fracture of one rib, right side, initial encounter for closed fracture: Secondary | ICD-10-CM | POA: Diagnosis not present

## 2022-04-24 DIAGNOSIS — S81811A Laceration without foreign body, right lower leg, initial encounter: Secondary | ICD-10-CM | POA: Diagnosis present

## 2022-04-24 DIAGNOSIS — Z743 Need for continuous supervision: Secondary | ICD-10-CM | POA: Diagnosis not present

## 2022-04-24 DIAGNOSIS — S80811A Abrasion, right lower leg, initial encounter: Secondary | ICD-10-CM | POA: Diagnosis present

## 2022-04-24 DIAGNOSIS — S22018A Other fracture of first thoracic vertebra, initial encounter for closed fracture: Secondary | ICD-10-CM | POA: Diagnosis not present

## 2022-04-24 DIAGNOSIS — Z888 Allergy status to other drugs, medicaments and biological substances status: Secondary | ICD-10-CM

## 2022-04-24 DIAGNOSIS — S22089A Unspecified fracture of T11-T12 vertebra, initial encounter for closed fracture: Secondary | ICD-10-CM | POA: Diagnosis not present

## 2022-04-24 DIAGNOSIS — I129 Hypertensive chronic kidney disease with stage 1 through stage 4 chronic kidney disease, or unspecified chronic kidney disease: Secondary | ICD-10-CM | POA: Diagnosis present

## 2022-04-24 DIAGNOSIS — F419 Anxiety disorder, unspecified: Secondary | ICD-10-CM | POA: Diagnosis present

## 2022-04-24 DIAGNOSIS — K0889 Other specified disorders of teeth and supporting structures: Secondary | ICD-10-CM | POA: Diagnosis present

## 2022-04-24 DIAGNOSIS — R58 Hemorrhage, not elsewhere classified: Secondary | ICD-10-CM | POA: Diagnosis not present

## 2022-04-24 DIAGNOSIS — R6889 Other general symptoms and signs: Secondary | ICD-10-CM | POA: Diagnosis not present

## 2022-04-24 DIAGNOSIS — Y92019 Unspecified place in single-family (private) house as the place of occurrence of the external cause: Secondary | ICD-10-CM | POA: Diagnosis not present

## 2022-04-24 DIAGNOSIS — E039 Hypothyroidism, unspecified: Secondary | ICD-10-CM | POA: Diagnosis present

## 2022-04-24 DIAGNOSIS — Z86711 Personal history of pulmonary embolism: Secondary | ICD-10-CM

## 2022-04-24 DIAGNOSIS — S32039A Unspecified fracture of third lumbar vertebra, initial encounter for closed fracture: Secondary | ICD-10-CM | POA: Diagnosis present

## 2022-04-24 DIAGNOSIS — S0990XA Unspecified injury of head, initial encounter: Secondary | ICD-10-CM | POA: Diagnosis not present

## 2022-04-24 DIAGNOSIS — Z886 Allergy status to analgesic agent status: Secondary | ICD-10-CM

## 2022-04-24 DIAGNOSIS — E872 Acidosis, unspecified: Secondary | ICD-10-CM | POA: Diagnosis present

## 2022-04-24 DIAGNOSIS — R519 Headache, unspecified: Secondary | ICD-10-CM | POA: Diagnosis not present

## 2022-04-24 DIAGNOSIS — Z955 Presence of coronary angioplasty implant and graft: Secondary | ICD-10-CM

## 2022-04-24 DIAGNOSIS — K219 Gastro-esophageal reflux disease without esophagitis: Secondary | ICD-10-CM | POA: Diagnosis present

## 2022-04-24 DIAGNOSIS — E119 Type 2 diabetes mellitus without complications: Secondary | ICD-10-CM | POA: Diagnosis not present

## 2022-04-24 DIAGNOSIS — I700 Atherosclerosis of aorta: Secondary | ICD-10-CM | POA: Diagnosis not present

## 2022-04-24 DIAGNOSIS — M4316 Spondylolisthesis, lumbar region: Secondary | ICD-10-CM | POA: Diagnosis present

## 2022-04-24 DIAGNOSIS — N1831 Chronic kidney disease, stage 3a: Secondary | ICD-10-CM | POA: Diagnosis present

## 2022-04-24 DIAGNOSIS — J439 Emphysema, unspecified: Secondary | ICD-10-CM | POA: Diagnosis not present

## 2022-04-24 DIAGNOSIS — R079 Chest pain, unspecified: Secondary | ICD-10-CM | POA: Diagnosis not present

## 2022-04-24 DIAGNOSIS — S22009A Unspecified fracture of unspecified thoracic vertebra, initial encounter for closed fracture: Secondary | ICD-10-CM | POA: Diagnosis not present

## 2022-04-24 DIAGNOSIS — I959 Hypotension, unspecified: Secondary | ICD-10-CM | POA: Diagnosis present

## 2022-04-24 DIAGNOSIS — S2241XD Multiple fractures of ribs, right side, subsequent encounter for fracture with routine healing: Secondary | ICD-10-CM | POA: Diagnosis present

## 2022-04-24 DIAGNOSIS — I1310 Hypertensive heart and chronic kidney disease without heart failure, with stage 1 through stage 4 chronic kidney disease, or unspecified chronic kidney disease: Secondary | ICD-10-CM | POA: Diagnosis present

## 2022-04-24 DIAGNOSIS — S3993XA Unspecified injury of pelvis, initial encounter: Secondary | ICD-10-CM | POA: Diagnosis not present

## 2022-04-24 DIAGNOSIS — M47819 Spondylosis without myelopathy or radiculopathy, site unspecified: Secondary | ICD-10-CM | POA: Diagnosis present

## 2022-04-24 DIAGNOSIS — J9811 Atelectasis: Secondary | ICD-10-CM | POA: Diagnosis not present

## 2022-04-24 DIAGNOSIS — E1140 Type 2 diabetes mellitus with diabetic neuropathy, unspecified: Secondary | ICD-10-CM | POA: Diagnosis present

## 2022-04-24 DIAGNOSIS — M47812 Spondylosis without myelopathy or radiculopathy, cervical region: Secondary | ICD-10-CM | POA: Diagnosis not present

## 2022-04-24 DIAGNOSIS — T1490XA Injury, unspecified, initial encounter: Secondary | ICD-10-CM | POA: Diagnosis not present

## 2022-04-24 DIAGNOSIS — M1811 Unilateral primary osteoarthritis of first carpometacarpal joint, right hand: Secondary | ICD-10-CM | POA: Diagnosis not present

## 2022-04-24 DIAGNOSIS — S2242XA Multiple fractures of ribs, left side, initial encounter for closed fracture: Secondary | ICD-10-CM | POA: Diagnosis not present

## 2022-04-24 DIAGNOSIS — J939 Pneumothorax, unspecified: Secondary | ICD-10-CM | POA: Diagnosis not present

## 2022-04-24 DIAGNOSIS — S60512A Abrasion of left hand, initial encounter: Secondary | ICD-10-CM | POA: Diagnosis present

## 2022-04-24 DIAGNOSIS — E871 Hypo-osmolality and hyponatremia: Secondary | ICD-10-CM | POA: Diagnosis not present

## 2022-04-24 DIAGNOSIS — J9690 Respiratory failure, unspecified, unspecified whether with hypoxia or hypercapnia: Secondary | ICD-10-CM | POA: Diagnosis not present

## 2022-04-24 DIAGNOSIS — I499 Cardiac arrhythmia, unspecified: Secondary | ICD-10-CM | POA: Diagnosis not present

## 2022-04-24 DIAGNOSIS — J90 Pleural effusion, not elsewhere classified: Secondary | ICD-10-CM | POA: Diagnosis not present

## 2022-04-24 DIAGNOSIS — M47814 Spondylosis without myelopathy or radiculopathy, thoracic region: Secondary | ICD-10-CM | POA: Diagnosis present

## 2022-04-24 DIAGNOSIS — F32A Depression, unspecified: Secondary | ICD-10-CM | POA: Diagnosis present

## 2022-04-24 DIAGNOSIS — Z8249 Family history of ischemic heart disease and other diseases of the circulatory system: Secondary | ICD-10-CM

## 2022-04-24 LAB — I-STAT CHEM 8, ED
BUN: 22 mg/dL (ref 8–23)
Calcium, Ion: 1.24 mmol/L (ref 1.15–1.40)
Chloride: 104 mmol/L (ref 98–111)
Creatinine, Ser: 1.4 mg/dL — ABNORMAL HIGH (ref 0.44–1.00)
Glucose, Bld: 211 mg/dL — ABNORMAL HIGH (ref 70–99)
HCT: 36 % (ref 36.0–46.0)
Hemoglobin: 12.2 g/dL (ref 12.0–15.0)
Potassium: 4.2 mmol/L (ref 3.5–5.1)
Sodium: 138 mmol/L (ref 135–145)
TCO2: 24 mmol/L (ref 22–32)

## 2022-04-24 LAB — COMPREHENSIVE METABOLIC PANEL
ALT: 738 U/L — ABNORMAL HIGH (ref 0–44)
AST: 876 U/L — ABNORMAL HIGH (ref 15–41)
Albumin: 4 g/dL (ref 3.5–5.0)
Alkaline Phosphatase: 58 U/L (ref 38–126)
Anion gap: 11 (ref 5–15)
BUN: 21 mg/dL (ref 8–23)
CO2: 21 mmol/L — ABNORMAL LOW (ref 22–32)
Calcium: 9.6 mg/dL (ref 8.9–10.3)
Chloride: 105 mmol/L (ref 98–111)
Creatinine, Ser: 1.49 mg/dL — ABNORMAL HIGH (ref 0.44–1.00)
GFR, Estimated: 36 mL/min — ABNORMAL LOW (ref >60)
Glucose, Bld: 212 mg/dL — ABNORMAL HIGH (ref 70–99)
Potassium: 4.2 mmol/L (ref 3.5–5.1)
Sodium: 137 mmol/L (ref 135–145)
Total Bilirubin: 1 mg/dL (ref 0.3–1.2)
Total Protein: 7.3 g/dL (ref 6.5–8.1)

## 2022-04-24 LAB — PROTIME-INR
INR: 1.1 (ref 0.8–1.2)
Prothrombin Time: 14 seconds (ref 11.4–15.2)

## 2022-04-24 LAB — SAMPLE TO BLOOD BANK

## 2022-04-24 LAB — CBC
HCT: 33.7 % — ABNORMAL LOW (ref 36.0–46.0)
Hemoglobin: 11.4 g/dL — ABNORMAL LOW (ref 12.0–15.0)
MCH: 31.4 pg (ref 26.0–34.0)
MCHC: 33.8 g/dL (ref 30.0–36.0)
MCV: 92.8 fL (ref 80.0–100.0)
Platelets: 264 10*3/uL (ref 150–400)
RBC: 3.63 MIL/uL — ABNORMAL LOW (ref 3.87–5.11)
RDW: 13.1 % (ref 11.5–15.5)
WBC: 9.7 10*3/uL (ref 4.0–10.5)
nRBC: 0 % (ref 0.0–0.2)

## 2022-04-24 LAB — LACTIC ACID, PLASMA: Lactic Acid, Venous: 3.3 mmol/L (ref 0.5–1.9)

## 2022-04-24 LAB — ETHANOL: Alcohol, Ethyl (B): <10 mg/dL (ref <10)

## 2022-04-24 MED ORDER — MORPHINE SULFATE (PF) 2 MG/ML IV SOLN
1.0000 mg | INTRAVENOUS | Status: DC | PRN
  Administered 2022-04-24 – 2022-04-30 (×12): 2 mg via INTRAVENOUS
  Filled 2022-04-24 (×14): qty 1

## 2022-04-24 MED ORDER — DOCUSATE SODIUM 100 MG PO CAPS
100.0000 mg | ORAL_CAPSULE | Freq: Two times a day (BID) | ORAL | Status: DC
  Administered 2022-04-24 – 2022-05-01 (×13): 100 mg via ORAL
  Filled 2022-04-24 (×14): qty 1

## 2022-04-24 MED ORDER — KETOROLAC TROMETHAMINE 15 MG/ML IJ SOLN
15.0000 mg | Freq: Four times a day (QID) | INTRAMUSCULAR | Status: DC
  Administered 2022-04-24 – 2022-04-26 (×7): 15 mg via INTRAVENOUS
  Filled 2022-04-24 (×9): qty 1

## 2022-04-24 MED ORDER — ENOXAPARIN SODIUM 30 MG/0.3ML IJ SOSY
30.0000 mg | PREFILLED_SYRINGE | Freq: Two times a day (BID) | INTRAMUSCULAR | Status: DC
  Administered 2022-04-25 (×2): 30 mg via SUBCUTANEOUS
  Filled 2022-04-24 (×2): qty 0.3

## 2022-04-24 MED ORDER — METHOCARBAMOL 500 MG PO TABS
1000.0000 mg | ORAL_TABLET | Freq: Three times a day (TID) | ORAL | Status: DC
  Administered 2022-04-24 – 2022-05-01 (×22): 1000 mg via ORAL
  Filled 2022-04-24 (×22): qty 2

## 2022-04-24 MED ORDER — SODIUM CHLORIDE 0.9 % IV SOLN: INTRAVENOUS | Status: DC

## 2022-04-24 MED ORDER — ONDANSETRON HCL 4 MG/2ML IJ SOLN
4.0000 mg | Freq: Four times a day (QID) | INTRAMUSCULAR | Status: DC | PRN
  Administered 2022-04-24: 4 mg via INTRAVENOUS
  Filled 2022-04-24: qty 2

## 2022-04-24 MED ORDER — IOHEXOL 350 MG/ML SOLN
75.0000 mL | Freq: Once | INTRAVENOUS | Status: AC | PRN
  Administered 2022-04-24: 75 mL via INTRAVENOUS

## 2022-04-24 MED ORDER — SODIUM CHLORIDE 0.9 % IV BOLUS
1000.0000 mL | Freq: Once | INTRAVENOUS | Status: AC
  Administered 2022-04-24: 1000 mL via INTRAVENOUS

## 2022-04-24 MED ORDER — ACETAMINOPHEN 500 MG PO TABS
1000.0000 mg | ORAL_TABLET | Freq: Four times a day (QID) | ORAL | Status: DC
  Administered 2022-04-24 – 2022-05-01 (×25): 1000 mg via ORAL
  Filled 2022-04-24 (×27): qty 2

## 2022-04-24 MED ORDER — OXYCODONE HCL 5 MG PO TABS
2.5000 mg | ORAL_TABLET | ORAL | Status: DC | PRN
  Administered 2022-04-25 (×3): 5 mg via ORAL
  Filled 2022-04-24 (×3): qty 1

## 2022-04-24 MED ORDER — ONDANSETRON 4 MG PO TBDP: 4.0000 mg | ORAL_TABLET | Freq: Four times a day (QID) | ORAL | Status: DC | PRN

## 2022-04-24 NOTE — ED Notes (Signed)
Pt was given cefazolin 2 g IV, and tetanus in right deltoid

## 2022-04-24 NOTE — ED Provider Notes (Signed)
[place] [facility] AT [facility] Provider Note   CSN: [id number] Arrival date & time: 04/24/22  1604     History  Chief Complaint  Patient presents with   Taylor [name] is a 77 y.o. female.  Patient arrives after a fall.  She fell through her deck about 8 to 10 feet up.  She had abrasions to both hands.  She does not think she lost consciousness.  She is having back pain, chest pain, neck pain.  She does not that she lost consciousness.  She is on Plavix.  Possibly hypotensive and route.  But has been may be 90 systolic.  Denies any nausea or vomiting.  The history is provided by the patient.       Home Medications Prior to Admission medications   Medication Sig Start Date End Date Taking? Authorizing Provider  acetaminophen (TYLENOL) 500 MG tablet Take 1,000 mg by mouth every 6 (six) hours as needed for headache (pain).     [provider]  ALPRAZolam Prudy Feeler) 0.25 MG tablet Take 0.25 mg by mouth daily as needed for anxiety.     [provider]  aspirin 81 MG tablet Take 81 mg by mouth daily.    [provider]  clopidogrel (PLAVIX) 75 MG tablet Take 1 tablet (75 mg total) by mouth daily. 05/26/21   Nahser, Deloris Ping, MD  glipiZIDE (GLUCOTROL) 5 MG tablet Take 10 mg by mouth daily. 05/06/21   [provider]  isosorbide mononitrate (IMDUR) 60 MG 24 hr tablet Take 1 tablet by mouth once daily 07/23/21   Nahser, Deloris Ping, MD  levothyroxine (SYNTHROID) 75 MCG tablet Take 75 mcg by mouth daily before breakfast. 03/30/22   [provider]  lisinopril (ZESTRIL) 10 MG tablet Take 10 mg by mouth daily. 02/08/21   [provider]  metFORMIN (GLUCOPHAGE) 500 MG tablet Take 1,000 mg by mouth 2 (two) times daily with a meal.     [provider]  nitroGLYCERIN (NITROSTAT) 0.4 MG SL tablet Place 1 tablet (0.4 mg total) under the tongue every 5 (five) minutes as needed for chest pain. 12/13/18   Nahser,  Deloris Ping, MD  pioglitazone (ACTOS) 15 MG tablet Take 1 tablet by mouth daily. Patient not taking: Reported on 04/21/2022 02/22/20   [provider]  rosuvastatin (CRESTOR) 20 MG tablet TAKE ONE TABLET BY MOUTH ONCE DAILY AT  6PM 12/01/16   Nahser, Deloris Ping, MD  sertraline (ZOLOFT) 100 MG tablet Take 100 mg by mouth daily.     [provider]  timolol (BETIMOL) 0.25 % ophthalmic solution 1-2 drops 2 (two) times daily.    [provider]      Allergies    Niacin, Duloxetine, Amoxicillin, Ibuprofen, and Lipitor [atorvastatin]    Review of Systems   Review of Systems  Physical Exam Updated Vital Signs BP 102/62 (BP Location: Right Arm)   SpO2 99%  Physical Exam Vitals and nursing note reviewed.  Constitutional:      General: She is not in acute distress.    Appearance: She is well-developed. She is not ill-appearing.  HENT:     Head: Normocephalic and atraumatic.     Nose: Nose normal.     Mouth/Throat:     Mouth: Mucous membranes are moist.  Eyes:     Extraocular Movements: Extraocular movements intact.     Conjunctiva/sclera: Conjunctivae normal.     Pupils: Pupils are equal, round, and reactive  to light.  Cardiovascular:     Rate and Rhythm: Normal rate and regular rhythm.     Heart sounds: No murmur heard. Pulmonary:     Effort: Pulmonary effort is normal. No respiratory distress.     Breath sounds: Normal breath sounds.  Abdominal:     Palpations: Abdomen is soft.     Tenderness: There is no abdominal tenderness.  Musculoskeletal:        General: No swelling.     Cervical back: Neck supple.     Comments: Tenderness over chest wall, some tenderness to both hands, no specific midline tenderness  Skin:    General: Skin is warm and dry.     Capillary Refill: Capillary refill takes less than 2 seconds.     Comments: Skin tear to the right and left hand, small skin tear to the right shin  Neurological:     General: No focal deficit present.      Mental Status: She is alert.     Comments: Normal strength and sensation throughout she is awake and alert moves all extremities well  Psychiatric:        Mood and Affect: Mood normal.     ED Results / Procedures / Treatments   Labs (all labs ordered are listed, but only abnormal results are displayed) Labs Reviewed  COMPREHENSIVE METABOLIC PANEL - Abnormal; Notable for the following components:      Result Value   CO2 21 (*)    Glucose, Bld 212 (*)    Creatinine, Ser 1.49 (*)    AST 876 (*)    ALT 738 (*)    GFR, Estimated 36 (*)    All other components within normal limits  CBC - Abnormal; Notable for the following components:   RBC 3.63 (*)    Hemoglobin 11.4 (*)    HCT 33.7 (*)    All other components within normal limits  I-STAT CHEM 8, ED - Abnormal; Notable for the following components:   Creatinine, Ser 1.40 (*)    Glucose, Bld 211 (*)    All other components within normal limits  ETHANOL  PROTIME-INR  URINALYSIS, ROUTINE W REFLEX MICROSCOPIC  LACTIC ACID, PLASMA  SAMPLE TO BLOOD BANK    EKG None  Radiology CT HEAD WO CONTRAST  Result Date: 04/24/2022 CLINICAL DATA:  [id number] Pain 144615; Polytrauma, blunt; Head trauma, moderate-severe EXAM: CT HEAD WITHOUT CONTRAST CT CERVICAL SPINE WITHOUT CONTRAST CT THORACIC SPINE WITHOUT CONTRAST CT LUMBAR SPINE WITHOUT CONTRAST TECHNIQUE: Multidetector CT imaging of the head, cervical and thoracic spine was performed following the standard protocol without intravenous contrast. Multiplanar CT image reconstructions of the cervical, thoracic and lumbar spine were also generated. RADIATION DOSE REDUCTION: This exam was performed according to the departmental dose-optimization program which includes automated exposure control, adjustment of the mA and/or kV according to patient size and/or use of iterative reconstruction technique. COMPARISON:  None Available. FINDINGS: CT HEAD FINDINGS Brain: No evidence of acute infarction,  hemorrhage, hydrocephalus, extra-axial collection or mass lesion/mass effect. Vascular: No hyperdense vessel identified. Skull: No acute fracture. Sinuses/Orbits: Clear sinuses. Postoperative changes of both globes. No acute orbital findings. Other: No mastoid effusions. CT CERVICAL SPINE FINDINGS Alignment: Normal. Skull base and vertebrae: No evidence of acute fracture. Vertebral body heights are maintained. Soft tissues and spinal canal: No prevertebral fluid or swelling. No visible canal hematoma. Disc levels:  Multilevel degenerative change. Upper chest: Please see concurrent CT chest abdomen pelvis for intrathoracic evaluation, including description of small  pneumothorax. CT THORACIC SPINE FINDINGS Alignment: No substantial sagittal subluxation. Skull base and vertebrae: Acute nondisplaced fracture involving the anterior and inferior T11 vertebral body with extension to involve the bridging osteophyte and T11-T12 disc space (for example see series 9, image 59; series 8, image 59). Disc levels:  Mild multilevel degenerative change. Paraspinal: Please see concurrent CT chest abdomen pelvis for intrathoracic evaluation. CT LUMBAR SPINE FINDINGS Alignment: Grade 1 anterolisthesis of L3 on L4, likely degenerative given severe facet arthropathy at this level. Skull base and vertebrae: Acute nondisplaced fracture of the right L1, L2, and L3 transverse processes. Vertebral body heights are maintained. Fall disc levels: Severe facet arthropathy at L3-L4. Paraspinal: Please see concurrent CT chest abdomen pelvis for intra-abdominal evaluation IMPRESSION: CT head: No acute intracranial abnormality. CT cervical, thoracic and lumbar spine: 1. Acute nondisplaced fracture involving the anterior and inferior T11 vertebral body with extension to involve the bridging osteophyte and T11-T12 disc space. 2. Acute nondisplaced fracture of the right L1, L2, and L3 transverse processes. 3. Severe facet arthropathy at L3-L4 with  degenerative grade 1 anterolisthesis of L3 on L4. Findings discussed with Dr. Bedelia Person via telephone at 5:23 p.m. Electronically Signed   By: Feliberto Harts M.D.   On: 04/24/2022 17:26   CT CERVICAL SPINE WO CONTRAST  Result Date: 04/24/2022 CLINICAL DATA:  [id number] Pain 144615; Polytrauma, blunt; Head trauma, moderate-severe EXAM: CT HEAD WITHOUT CONTRAST CT CERVICAL SPINE WITHOUT CONTRAST CT THORACIC SPINE WITHOUT CONTRAST CT LUMBAR SPINE WITHOUT CONTRAST TECHNIQUE: Multidetector CT imaging of the head, cervical and thoracic spine was performed following the standard protocol without intravenous contrast. Multiplanar CT image reconstructions of the cervical, thoracic and lumbar spine were also generated. RADIATION DOSE REDUCTION: This exam was performed according to the departmental dose-optimization program which includes automated exposure control, adjustment of the mA and/or kV according to patient size and/or use of iterative reconstruction technique. COMPARISON:  None Available. FINDINGS: CT HEAD FINDINGS Brain: No evidence of acute infarction, hemorrhage, hydrocephalus, extra-axial collection or mass lesion/mass effect. Vascular: No hyperdense vessel identified. Skull: No acute fracture. Sinuses/Orbits: Clear sinuses. Postoperative changes of both globes. No acute orbital findings. Other: No mastoid effusions. CT CERVICAL SPINE FINDINGS Alignment: Normal. Skull base and vertebrae: No evidence of acute fracture. Vertebral body heights are maintained. Soft tissues and spinal canal: No prevertebral fluid or swelling. No visible canal hematoma. Disc levels:  Multilevel degenerative change. Upper chest: Please see concurrent CT chest abdomen pelvis for intrathoracic evaluation, including description of small pneumothorax. CT THORACIC SPINE FINDINGS Alignment: No substantial sagittal subluxation. Skull base and vertebrae: Acute nondisplaced fracture involving the anterior and inferior T11 vertebral body with  extension to involve the bridging osteophyte and T11-T12 disc space (for example see series 9, image 59; series 8, image 59). Disc levels:  Mild multilevel degenerative change. Paraspinal: Please see concurrent CT chest abdomen pelvis for intrathoracic evaluation. CT LUMBAR SPINE FINDINGS Alignment: Grade 1 anterolisthesis of L3 on L4, likely degenerative given severe facet arthropathy at this level. Skull base and vertebrae: Acute nondisplaced fracture of the right L1, L2, and L3 transverse processes. Vertebral body heights are maintained. Fall disc levels: Severe facet arthropathy at L3-L4. Paraspinal: Please see concurrent CT chest abdomen pelvis for intra-abdominal evaluation IMPRESSION: CT head: No acute intracranial abnormality. CT cervical, thoracic and lumbar spine: 1. Acute nondisplaced fracture involving the anterior and inferior T11 vertebral body with extension to involve the bridging osteophyte and T11-T12 disc space. 2. Acute nondisplaced fracture of the right L1, L2,  and L3 transverse processes. 3. Severe facet arthropathy at L3-L4 with degenerative grade 1 anterolisthesis of L3 on L4. Findings discussed with Dr. Bedelia Person via telephone at 5:23 p.m. Electronically Signed   By: Feliberto Harts M.D.   On: 04/24/2022 17:26   CT L-SPINE NO CHARGE  Result Date: 04/24/2022 CLINICAL DATA:  [id number] Pain 144615; Polytrauma, blunt; Head trauma, moderate-severe EXAM: CT HEAD WITHOUT CONTRAST CT CERVICAL SPINE WITHOUT CONTRAST CT THORACIC SPINE WITHOUT CONTRAST CT LUMBAR SPINE WITHOUT CONTRAST TECHNIQUE: Multidetector CT imaging of the head, cervical and thoracic spine was performed following the standard protocol without intravenous contrast. Multiplanar CT image reconstructions of the cervical, thoracic and lumbar spine were also generated. RADIATION DOSE REDUCTION: This exam was performed according to the departmental dose-optimization program which includes automated exposure control, adjustment of the mA  and/or kV according to patient size and/or use of iterative reconstruction technique. COMPARISON:  None Available. FINDINGS: CT HEAD FINDINGS Brain: No evidence of acute infarction, hemorrhage, hydrocephalus, extra-axial collection or mass lesion/mass effect. Vascular: No hyperdense vessel identified. Skull: No acute fracture. Sinuses/Orbits: Clear sinuses. Postoperative changes of both globes. No acute orbital findings. Other: No mastoid effusions. CT CERVICAL SPINE FINDINGS Alignment: Normal. Skull base and vertebrae: No evidence of acute fracture. Vertebral body heights are maintained. Soft tissues and spinal canal: No prevertebral fluid or swelling. No visible canal hematoma. Disc levels:  Multilevel degenerative change. Upper chest: Please see concurrent CT chest abdomen pelvis for intrathoracic evaluation, including description of small pneumothorax. CT THORACIC SPINE FINDINGS Alignment: No substantial sagittal subluxation. Skull base and vertebrae: Acute nondisplaced fracture involving the anterior and inferior T11 vertebral body with extension to involve the bridging osteophyte and T11-T12 disc space (for example see series 9, image 59; series 8, image 59). Disc levels:  Mild multilevel degenerative change. Paraspinal: Please see concurrent CT chest abdomen pelvis for intrathoracic evaluation. CT LUMBAR SPINE FINDINGS Alignment: Grade 1 anterolisthesis of L3 on L4, likely degenerative given severe facet arthropathy at this level. Skull base and vertebrae: Acute nondisplaced fracture of the right L1, L2, and L3 transverse processes. Vertebral body heights are maintained. Fall disc levels: Severe facet arthropathy at L3-L4. Paraspinal: Please see concurrent CT chest abdomen pelvis for intra-abdominal evaluation IMPRESSION: CT head: No acute intracranial abnormality. CT cervical, thoracic and lumbar spine: 1. Acute nondisplaced fracture involving the anterior and inferior T11 vertebral body with extension to  involve the bridging osteophyte and T11-T12 disc space. 2. Acute nondisplaced fracture of the right L1, L2, and L3 transverse processes. 3. Severe facet arthropathy at L3-L4 with degenerative grade 1 anterolisthesis of L3 on L4. Findings discussed with Dr. Bedelia Person via telephone at 5:23 p.m. Electronically Signed   By: Feliberto Harts M.D.   On: 04/24/2022 17:26   CT T-SPINE NO CHARGE  Result Date: 04/24/2022 CLINICAL DATA:  [id number] Pain 144615; Polytrauma, blunt; Head trauma, moderate-severe EXAM: CT HEAD WITHOUT CONTRAST CT CERVICAL SPINE WITHOUT CONTRAST CT THORACIC SPINE WITHOUT CONTRAST CT LUMBAR SPINE WITHOUT CONTRAST TECHNIQUE: Multidetector CT imaging of the head, cervical and thoracic spine was performed following the standard protocol without intravenous contrast. Multiplanar CT image reconstructions of the cervical, thoracic and lumbar spine were also generated. RADIATION DOSE REDUCTION: This exam was performed according to the departmental dose-optimization program which includes automated exposure control, adjustment of the mA and/or kV according to patient size and/or use of iterative reconstruction technique. COMPARISON:  None Available. FINDINGS: CT HEAD FINDINGS Brain: No evidence of acute infarction, hemorrhage, hydrocephalus, extra-axial collection or mass  lesion/mass effect. Vascular: No hyperdense vessel identified. Skull: No acute fracture. Sinuses/Orbits: Clear sinuses. Postoperative changes of both globes. No acute orbital findings. Other: No mastoid effusions. CT CERVICAL SPINE FINDINGS Alignment: Normal. Skull base and vertebrae: No evidence of acute fracture. Vertebral body heights are maintained. Soft tissues and spinal canal: No prevertebral fluid or swelling. No visible canal hematoma. Disc levels:  Multilevel degenerative change. Upper chest: Please see concurrent CT chest abdomen pelvis for intrathoracic evaluation, including description of small pneumothorax. CT THORACIC SPINE  FINDINGS Alignment: No substantial sagittal subluxation. Skull base and vertebrae: Acute nondisplaced fracture involving the anterior and inferior T11 vertebral body with extension to involve the bridging osteophyte and T11-T12 disc space (for example see series 9, image 59; series 8, image 59). Disc levels:  Mild multilevel degenerative change. Paraspinal: Please see concurrent CT chest abdomen pelvis for intrathoracic evaluation. CT LUMBAR SPINE FINDINGS Alignment: Grade 1 anterolisthesis of L3 on L4, likely degenerative given severe facet arthropathy at this level. Skull base and vertebrae: Acute nondisplaced fracture of the right L1, L2, and L3 transverse processes. Vertebral body heights are maintained. Fall disc levels: Severe facet arthropathy at L3-L4. Paraspinal: Please see concurrent CT chest abdomen pelvis for intra-abdominal evaluation IMPRESSION: CT head: No acute intracranial abnormality. CT cervical, thoracic and lumbar spine: 1. Acute nondisplaced fracture involving the anterior and inferior T11 vertebral body with extension to involve the bridging osteophyte and T11-T12 disc space. 2. Acute nondisplaced fracture of the right L1, L2, and L3 transverse processes. 3. Severe facet arthropathy at L3-L4 with degenerative grade 1 anterolisthesis of L3 on L4. Findings discussed with Dr. Bedelia Person via telephone at 5:23 p.m. Electronically Signed   By: Feliberto Harts M.D.   On: 04/24/2022 17:26   CT CHEST ABDOMEN PELVIS W CONTRAST  Result Date: 04/24/2022 CLINICAL DATA:  Blunt trauma. EXAM: CT CHEST, ABDOMEN, AND PELVIS WITH CONTRAST TECHNIQUE: Multidetector CT imaging of the chest, abdomen and pelvis was performed following the standard protocol during bolus administration of intravenous contrast. RADIATION DOSE REDUCTION: This exam was performed according to the departmental dose-optimization program which includes automated exposure control, adjustment of the mA and/or kV according to patient size  and/or use of iterative reconstruction technique. CONTRAST:  75 mL Omnipaque 350 intravenously. COMPARISON:  February 23, 2017.  November 10, 2016. FINDINGS: CT CHEST FINDINGS Cardiovascular: Status post coronary bypass graft. Atherosclerosis of thoracic aorta is noted without aneurysm or dissection. Mild cardiomegaly. No pericardial effusion. Mediastinum/Nodes: No enlarged mediastinal, hilar, or axillary lymph nodes. Thyroid gland, trachea, and esophagus demonstrate no significant findings. Lungs/Pleura: Small right anterior pneumothorax is noted. Minimal posterior basilar subsegmental atelectasis is noted bilaterally. No significant effusion is noted. Stable 6 mm right lower lobe nodule is noted which can be considered benign at this point. Musculoskeletal: Moderately to severely displaced fractures are seen involving the posterior portions of the right eleventh and tenth ribs. Overlying subcutaneous soft tissue gas and contusion is noted. Probable minimally displaced fracture involving the anterior and inferior aspect of T11 vertebral body. CT ABDOMEN PELVIS FINDINGS Hepatobiliary: Probable hepatic steatosis. Status post cholecystectomy. No biliary dilatation is noted. Pancreas: Unremarkable. No pancreatic ductal dilatation or surrounding inflammatory changes. Spleen: Normal in size without focal abnormality. Adrenals/Urinary Tract: Adrenal glands are unremarkable. Kidneys are normal, without renal calculi, focal lesion, or hydronephrosis. Bladder is unremarkable. Stomach/Bowel: Stomach is within normal limits. Appendix appears normal. No evidence of bowel wall thickening, distention, or inflammatory changes. Vascular/Lymphatic: Aortic atherosclerosis. No enlarged abdominal or pelvic lymph nodes. Reproductive:  Uterus and bilateral adnexa are unremarkable. Other: No abdominal wall hernia or abnormality. No abdominopelvic ascites. Musculoskeletal: No acute or significant osseous findings. IMPRESSION: Small  right-sided anterior pneumothorax is noted. Critical Value/emergent results were called by telephone at the time of interpretation on 04/24/2022 at 5:15 pm to provider Dr. Bedelia Person, who verbally acknowledged these results. Moderately to severely displaced fractures are seen involving the posterior portions of the right eleventh and twelfth ribs. Large amount of overlying subcutaneous gas and contusion is seen. Probable minimally displaced fracture involving anterior and inferior aspect of T11 vertebral body. Please see dedicated thoracic spine CT for more details. Probable hepatic steatosis. No other traumatic injury seen in the chest, abdomen or pelvis. Aortic Atherosclerosis (ICD10-I70.0). Electronically Signed   By: Lupita Raider M.D.   On: 04/24/2022 17:20   DG Pelvis Portable  Result Date: 04/24/2022 CLINICAL DATA:  Trauma EXAM: PORTABLE PELVIS 1 VIEWS COMPARISON:  None Available. FINDINGS: There is no evidence of pelvic fracture or diastasis. No pelvic bone lesions are seen. IMPRESSION: No acute fracture or dislocation. Electronically Signed   By: Agustin Cree M.D.   On: 04/24/2022 16:55   DG Chest Port 1 View  Result Date: 04/24/2022 CLINICAL DATA:  Trauma. EXAM: PORTABLE CHEST 1 VIEW COMPARISON:  February 22, 2017. FINDINGS: Stable cardiomediastinal silhouette. Sternotomy wires are noted. Both lungs are clear. The visualized skeletal structures are unremarkable. IMPRESSION: No active disease. Electronically Signed   By: Lupita Raider M.D.   On: 04/24/2022 16:54    Procedures Procedures    Medications Ordered in ED Medications  sodium chloride 0.9 % bolus 1,000 mL (1,000 mLs Intravenous New Bag/Given 04/24/22 1612)    And  0.9 %  sodium chloride infusion (has no administration in time range)  acetaminophen (TYLENOL) tablet 1,000 mg (has no administration in time range)  methocarbamol (ROBAXIN) tablet 1,000 mg (has no administration in time range)  oxyCODONE (Oxy IR/ROXICODONE) immediate  release tablet 2.5-5 mg (has no administration in time range)  ketorolac (TORADOL) 15 MG/ML injection 15 mg (has no administration in time range)  morphine (PF) 2 MG/ML injection 1-2 mg (has no administration in time range)  docusate sodium (COLACE) capsule 100 mg (has no administration in time range)  ondansetron (ZOFRAN-ODT) disintegrating tablet 4 mg (has no administration in time range)    Or  ondansetron (ZOFRAN) injection 4 mg (has no administration in time range)  enoxaparin (LOVENOX) injection 30 mg (has no administration in time range)  iohexol (OMNIPAQUE) 350 MG/ML injection 75 mL (75 mLs Intravenous Contrast Given 04/24/22 1721)    ED Course/ Medical Decision Making/ A&P                             Medical Decision Making Amount and/or Complexity of Data Reviewed Labs: ordered. Radiology: ordered.  Risk Prescription drug management. Decision regarding hospitalization.   Taylor [name] is here after a fall.  Hypotensive upon arrival here in the 80s systolic.  Made a level 1 trauma.  Dr. Bedelia Person with trauma team came down to the bedside.  Bedside chest x-ray and pelvic x-ray per my review and interpretation show no evidence of rib fracture or pneumothorax.  No pelvic fracture.  She will get full trauma scans.  Blood pressure resolved fairly quickly with some fluids.  She did not get any pain medicine and route.  She is having pain in her chest, abdomen, low back.  Abrasions and/skin tear  to both hands and to the right shin.  She will get CT scan of the head, neck, chest, abdomen and pelvis, basic labs to be drawn as well.  Blood pressure 102/62 prior to going to CT scan.  CT scan per radiology report shows small pneumothorax, rib fractures, compression fracture of T11.  Transverse process fractures of the lumbar spine as well.  Patient to be admitted to trauma service for further care.  Hemodynamics have stabilized.  No intra-abdominal injury.  This chart was dictated using voice  recognition software.  Despite best efforts to proofread,  errors can occur which can change the documentation meaning.         Final Clinical Impression(s) / ED Diagnoses Final diagnoses:  Compression fracture of body of thoracic vertebra  Traumatic pneumothorax, initial encounter  Closed fracture of multiple ribs, unspecified laterality, initial encounter    Rx / DC Orders ED Discharge Orders     None         Virgina Norfolk, DO 04/24/22 1738

## 2022-04-24 NOTE — ED Notes (Signed)
Upon arrival to room, pt is a&ox4, pale and dry. Pt found to be hypotensive and complaining of pain to chest, lower back and neck. Pt denies hitting her head or LOC. Pt also complains of "not feeling well at all" and being dizzy. Pt is tachypneic. Pt has abrasion to left forehead, both hands and wrists, avulsion to right lower leg. All bleeding controlled. Pt has bruising and abrasions to both lower back and bruising to right hip. She also has abrasions to bi lateral elbows. ABC's/PMS tact. C-collar in place/O2 via [place]/IVx2.

## 2022-04-24 NOTE — Progress Notes (Signed)
Trauma Response Nurse Documentation   Taylor [name] is a 77 y.o. female arriving to [facility] ED via EMS  On aspirin 325 mg daily and clopidogrel 75 mg daily. Trauma was activated as a Level 1 by EDP based on the following trauma criteria Anytime Systolic Blood Pressure < 90. Trauma team at the bedside on patient arrival.   Patient cleared for CT by Dr. Bedelia Person. Pt transported to CT with trauma response nurse present to monitor. RN remained with the patient throughout their absence from the department for clinical observation.   GCS 15.  History   Past Medical History:  Diagnosis Date   Cancer of left breast    Complication of anesthesia    Coronary artery disease    a. 2006 s/p CABG x 4 (LIMA->LAD, VG->Diag->OM, VG->RPDA);  b. 02/2015: [place] s/p DES to [facility], VG->PDA & VG->D1->OM 100; c. 06/2015 Cath: diffuse dzs->Med rx; d. 11/2015 Cath/PCI: LM nl, LAD 100ost, 95d, D1 90ost, LCX 35ost, 65p, OM2 40, lat OM2 90, RCA 95p (2.75x16 Synergy DES), patent mid stent, RPLB 70, LIMA->LAD ok.EF55-65%.   Depression    Hx of tobacco use, presenting hazards to health    a. quit 2006.   Hypercholesteremia    Hypertension    Hypertensive heart disease    Echo 04/2019: Inferior HK, EF 55, normal RV SF, RVSP normal at 29.2, mild BAE, trivial MR, mild aortic valve sclerosis without stenosis   Left carotid bruit    a. 09/2014 Carotid U/S: 1-39% bilat ICA stenosis.   PAD (peripheral artery disease)    a. 11/2014 ABI: R: 0.63, L 0.59;  b. 11/2014 Periph Angio: bilat pop occlusions. L - short w/ reconstituion via collats in dist pop w/ 2 vessel runoff, R long w/ reconstitution in prox tib/peroneal arteries-->med Rx w/ pletal.   Pulmonary embolism a. 2014.   Reflux esophagitis    Type II diabetes mellitus      Past Surgical History:  Procedure Laterality Date   BREAST BIOPSY Left ~ 2012   BUNIONECTOMY Left 1970s   CARDIAC CATHETERIZATION  04/2004   CARDIAC CATHETERIZATION N/A 02/21/2015   Procedure: Left  Heart Cath and Cors/Grafts Angiography;  Surgeon: Lyn Records, MD;  Location: [facility] INVASIVE CV LAB;  Service: Cardiovascular;  Laterality: N/A;   CARDIAC CATHETERIZATION N/A 02/21/2015   Procedure: Coronary Stent Intervention;  Surgeon: Lyn Records, MD;  Location: [facility] INVASIVE CV LAB;  Service: Cardiovascular;  Laterality: N/A;   CARDIAC CATHETERIZATION N/A 02/21/2015   Procedure: Intravascular Pressure Wire/FFR Study;  Surgeon: Lyn Records, MD;  Location: [facility] INVASIVE CV LAB;  Service: Cardiovascular;  Laterality: N/A;   CARDIAC CATHETERIZATION N/A 06/20/2015   Procedure: Left Heart Cath and Cors/Grafts Angiography;  Surgeon: Laurey Morale, MD;  Location: [facility] INVASIVE CV LAB;  Service: Cardiovascular;  Laterality: N/A;   CARDIAC CATHETERIZATION N/A 11/19/2015   Procedure: Left Heart Cath and Cors/Grafts Angiography;  Surgeon: Peter M Swaziland, MD;  Location: [facility] INVASIVE CV LAB;  Service: Cardiovascular;  Laterality: N/A;   CARDIAC CATHETERIZATION N/A 11/19/2015   Procedure: Coronary Stent Intervention;  Surgeon: Peter M Swaziland, MD;  Location: [facility] INVASIVE CV LAB;  Service: Cardiovascular;  Laterality: N/A;   CATARACT EXTRACTION W/ INTRAOCULAR LENS  IMPLANT, BILATERAL Bilateral 1980s-1990s   CORONARY ANGIOPLASTY WITH STENT PLACEMENT  02/21/2015   "1 stent"   CORONARY ARTERY BYPASS GRAFT  05/2004   "CABG X4"   EYE SURGERY     LAPAROSCOPIC CHOLECYSTECTOMY  1990s  MASTECTOMY Left ~ 2012   PERIPHERAL VASCULAR CATHETERIZATION N/A 12/12/2014   Procedure: Abdominal Aortogram;  Surgeon: Iran Ouch, MD;  Location: [facility] INVASIVE CV LAB;  Service: Cardiovascular;  Laterality: N/A;   RETINAL DETACHMENT SURGERY Right 1990s     Initial Focused Assessment (If applicable, or please see trauma documentation): - GCS 15 - PERRLA - C-collar in place - Abrasions/skin tears to bilateral wrists - Bruising to L axilla and L flank with abrasions  - Abrasions to R flank - Abrasion to R knee - 18G PIV to L AC  CT's  Completed:   CT Head, CT C-Spine, CT Chest w/ contrast, and CT abdomen/pelvis w/ contrast   Interventions:  - 20G PIV to R AC - Trauma labs drawn - tdap given - ancef given - 1L NS bolus given - CXR  - Pelvic XR - hand XRays  - CT pan scan  Plan for disposition:  Admission to floor   Consults completed:  none at 1815.  Event Summary: Pt here after sustaining injuries after she fell 10 feet through the deck. States she did not hit her head and had no LOC.   MTP Summary (If applicable): n/a  Bedside handoff with ED RN Taylor [name].    Taylor [name]  Trauma Response RN  Please call TRN at [phone] for further assistance.

## 2022-04-24 NOTE — H&P (Signed)
Taylor [name] March 17, 1945  [id number].    Requesting MD: Lockie Mola, MD  Chief Complaint/Reason for Consult: fall through deck, hypotension   HPI:  Taylor [name] is a 77 y/o F with who presented as a level 1 trauma after falling 8-10 feet through a deck. She denies LOC. She states no one witnessed the fall. Her cc is upper body pain, chest and back pain. She reports a cardiac history and takes ASA and plavix. Other reported heath issues include non-insulin dependent diabetes and former tobacco use. She denies alcohol or drug use, including prescription opioids.   Patient was a level 1 trauma due to hypotension, hypotension resolved with fluid bolus.  Primary Cardiologist: Kristeen Miss, MD   ROS: Review of Systems  Constitutional: Negative.   HENT: Negative.    Eyes: Negative.   Cardiovascular:  Positive for chest pain.  Gastrointestinal:  Positive for abdominal pain.  Musculoskeletal:  Positive for back pain, falls, joint pain and myalgias.  Neurological: Negative.   Endo/Heme/Allergies:  Bruises/bleeds easily.  All other systems reviewed and are negative.   Family History  Problem Relation Age of Onset   Coronary artery disease Mother 64       status post CABG    Heart attack Father 71       deceased secondary to fatal  first myocardial infarction   Coronary artery disease Sister 53       alive had bypass surgery   Cancer Sister    Deep vein thrombosis Cousin        Neice had after knee surgery    Past Medical History:  Diagnosis Date   Cancer of left breast (HCC)    Complication of anesthesia    Coronary artery disease    a. 2006 s/p CABG x 4 (LIMA->LAD, VG->Diag->OM, VG->RPDA);  b. 02/2015: [place] s/p DES to [facility], VG->PDA & VG->D1->OM 100; c. 06/2015 Cath: diffuse dzs->Med rx; d. 11/2015 Cath/PCI: LM nl, LAD 100ost, 95d, D1 90ost, LCX 35ost, 65p, OM2 40, lat OM2 90, RCA 95p (2.75x16 Synergy DES), patent mid stent, RPLB 70, LIMA->LAD ok.EF55-65%.   Depression    Hx  of tobacco use, presenting hazards to health    a. quit 2006.   Hypercholesteremia    Hypertension    Hypertensive heart disease    Echo 04/2019: Inferior HK, EF 55, normal RV SF, RVSP normal at 29.2, mild BAE, trivial MR, mild aortic valve sclerosis without stenosis   Left carotid bruit    a. 09/2014 Carotid U/S: 1-39% bilat ICA stenosis.   PAD (peripheral artery disease) (HCC)    a. 11/2014 ABI: R: 0.63, L 0.59;  b. 11/2014 Periph Angio: bilat pop occlusions. L - short w/ reconstituion via collats in dist pop w/ 2 vessel runoff, R long w/ reconstitution in prox tib/peroneal arteries-->med Rx w/ pletal.   Pulmonary embolism (HCC) a. 2014.   Reflux esophagitis    Type II diabetes mellitus (HCC)     Past Surgical History:  Procedure Laterality Date   BREAST BIOPSY Left ~ 2012   BUNIONECTOMY Left 1970s   CARDIAC CATHETERIZATION  04/2004   CARDIAC CATHETERIZATION N/A 02/21/2015   Procedure: Left Heart Cath and Cors/Grafts Angiography;  Surgeon: Lyn Records, MD;  Location: [facility] INVASIVE CV LAB;  Service: Cardiovascular;  Laterality: N/A;   CARDIAC CATHETERIZATION N/A 02/21/2015   Procedure: Coronary Stent Intervention;  Surgeon: Lyn Records, MD;  Location: [facility] INVASIVE CV LAB;  Service: Cardiovascular;  Laterality: N/A;  CARDIAC CATHETERIZATION N/A 02/21/2015   Procedure: Intravascular Pressure Wire/FFR Study;  Surgeon: Lyn Records, MD;  Location: [facility] INVASIVE CV LAB;  Service: Cardiovascular;  Laterality: N/A;   CARDIAC CATHETERIZATION N/A 06/20/2015   Procedure: Left Heart Cath and Cors/Grafts Angiography;  Surgeon: Laurey Morale, MD;  Location: [facility] INVASIVE CV LAB;  Service: Cardiovascular;  Laterality: N/A;   CARDIAC CATHETERIZATION N/A 11/19/2015   Procedure: Left Heart Cath and Cors/Grafts Angiography;  Surgeon: Peter M Swaziland, MD;  Location: [facility] INVASIVE CV LAB;  Service: Cardiovascular;  Laterality: N/A;   CARDIAC CATHETERIZATION N/A 11/19/2015   Procedure: Coronary Stent Intervention;   Surgeon: Peter M Swazil[place], MD;  Location: [facility] INVASIVE CV LAB;  Service: Cardiovascular;  Laterality: N/A;   CATARACT EXTRACTION W/ INTRAOCULAR LENS  IMPLANT, BILATERAL Bilateral 1980s-1990s   CORONARY ANGIOPLASTY WITH STENT PLACEMENT  02/21/2015   "1 stent"   CORONARY ARTERY BYPASS GRAFT  05/2004   "CABG X4"   EYE SURGERY     LAPAROSCOPIC CHOLECYSTECTOMY  1990s   MASTECTOMY Left ~ 2012   PERIPHERAL VASCULAR CATHETERIZATION N/A 12/12/2014   Procedure: Abdominal Aortogram;  Surgeon: Iran Ouch, MD;  Location: [facility] INVASIVE CV LAB;  Service: Cardiovascular;  Laterality: N/A;   RETINAL DETACHMENT SURGERY Right 1990s    Social History:  reports that she quit smoking about 17 years ago. Her smoking use included cigarettes. She has a 7.50 pack-year smoking history. She has never used smokeless tobacco. She reports that she does not drink alcohol and does not use drugs.  Allergies:  Allergies  Allergen Reactions   Niacin Other (See Comments)    burning   Duloxetine     Other reaction(s): Other (See Comments)   Amoxicillin Rash   Ibuprofen Rash   Lipitor [Atorvastatin] Other (See Comments)    Muscle pain    (Not in a hospital admission)    Physical Exam: Blood pressure 102/62, SpO2 99 %. General: white female laying on hospital bed, appears stated age, appears uncomfortable  HEENT: head -normocephalic, atraumatic; Eyes: PERRLA, no conjunctival injection; Ears- no external lesions or tenderness, Nose: nonerythematous, no polyps/masses; Throat: pink mucosa, uvula midline, no blood in oropharynx.  Neck- c- collar in place, Trachea is midline CV- RRR, normal S1/S2, no M/R/G, no lower extremity edema  Pulm- breathing is non-labored. On nasal cannula, CTABL, no wheezes, rhales, rhonchi. Abd- soft, mild distention, there is mild tenderness to palpation, worse over LLQ with guarding, no peritonitis, no hernias or masses; left flank hematoma, right flank hematoma GU- normal external female  anatomy;  MSK- UE/LE symmetrical, multiple abrasions, contusions and skin tears, see diagram and description  LUE - abrasion over dorsum left hand, wiggles all fingers, no bony tenderness over the wrist, elbow or shoulder, there is ecchymosis of the left axilla  RUE - abrasion over wrist, elbow   RLE - abrasion left anterior lower leg with small hematoma  Neuro- non-focal exam no paresthesias. Psych- Alert and Oriented x3 with appropriate affect Skin: warm and dry, no rashes or lesions  Physical Exam Constitutional:        Comments: Abrasions and contusions as above. No head lacerations appreciated.      Results for orders placed or performed during the hospital encounter of 04/24/22 (from the past 48 hour(s))  CBC     Status: Abnormal   Collection Time: 04/24/22  4:08 PM  Result Value Ref Range   WBC 9.7 4.0 - 10.5 K/uL   RBC 3.63 (L) 3.87 - 5.11  MIL/uL   Hemoglobin 11.4 (L) 12.0 - 15.0 g/dL   HCT 05.6 (L) 97.9 - 48.0 %   MCV 92.8 80.0 - 100.0 fL   MCH 31.4 26.0 - 34.0 pg   MCHC 33.8 30.0 - 36.0 g/dL   RDW 16.5 53.7 - 48.2 %   Platelets 264 150 - 400 K/uL   nRBC 0.0 0.0 - 0.2 %    Comment: Performed at [facility], [address], [place], [place] [address]   No results found.    Assessment/Plan 77 y/o F s/p fall from 8-10 feet without LOC T11 vertebral body FX - NS c/s L1-3 TP FX  Small right anterior PTX - CXR in AM R Posterior Rib FX 11-12 - multimodal pain control  L hand abrasion - no underlying FX, chronic OA  Diabetes mellitus CAD, PAD - cardiologist Dr. Elease Hashimoto, hold DAPT GERD  FEN - Reg, NS 125 ml/hr  VTE - SCD's, hold ASA and plavix  ID - given ancef and tdap in ED 4/12 Admit - trauma service   I reviewed nursing notes, ED provider notes, last 24 h vitals and pain scores, last 48 h intake and output, last 24 h labs and trends, and last 24 h imaging results.  Hosie Spangle, [facility] 04/24/2022, 4:31 PM Please see Amion  for pager number during day hours 7:00am-4:30pm or 7:00am -11:30am on weekends

## 2022-04-24 NOTE — Progress Notes (Signed)
Dr. Sheliah Hatch paged that patient has critical Lactic acid 3.3

## 2022-04-24 NOTE — ED Notes (Signed)
ED TO INPATIENT HANDOFF REPORT  ED Nurse Name and Phone #:  [phone]  S Name/Age/Gender Taylor [name] 77 y.o. female Room/Bed: 005C/005C  Code Status   Code Status: Full Code  Home/SNF/Other Home Patient oriented to: self, place, time, and situation Is this baseline? Yes      Chief Complaint Rib fractures [S22.49XA]  Triage Note No notes on file   Allergies Allergies  Allergen Reactions   Niacin Other (See Comments)    burning   Duloxetine     Other reaction(s): Other (See Comments)   Amoxicillin Rash   Ibuprofen Rash   Lipitor [Atorvastatin] Other (See Comments)    Muscle pain    Level of Care/Admitting Diagnosis ED Disposition     ED Disposition  Admit   Condition  --   Comment  Hospital Area: [facility] [[id number]]  Level of Care: Med-Surg [16]  May place patient in observation at [facility] or [facility] if equivalent level of care is available:: No  Covid Evaluation: Asymptomatic - no recent exposure (last 10 days) testing not required  Diagnosis: Rib fractures [454098]  Admitting Physician: TRAUMA MD [[id number]]  Attending Physician: TRAUMA MD [[id number]]          B Medical/Surgery History Past Medical History:  Diagnosis Date   Cancer of left breast    Complication of anesthesia    Coronary artery disease    a. 2006 s/p CABG x 4 (LIMA->LAD, VG->Diag->OM, VG->RPDA);  b. 02/2015: [place] s/p DES to [facility], VG->PDA & VG->D1->OM 100; c. 06/2015 Cath: diffuse dzs->Med rx; d. 11/2015 Cath/PCI: LM nl, LAD 100ost, 95d, D1 90ost, LCX 35ost, 65p, OM2 40, lat OM2 90, RCA 95p (2.75x16 Synergy DES), patent mid stent, RPLB 70, LIMA->LAD ok.EF55-65%.   Depression    Hx of tobacco use, presenting hazards to health    a. quit 2006.   Hypercholesteremia    Hypertension    Hypertensive heart disease    Echo 04/2019: Inferior HK, EF 55, normal RV SF, RVSP normal at 29.2, mild BAE, trivial MR, mild aortic valve sclerosis without stenosis   Left carotid  bruit    a. 09/2014 Carotid U/S: 1-39% bilat ICA stenosis.   PAD (peripheral artery disease)    a. 11/2014 ABI: R: 0.63, L 0.59;  b. 11/2014 Periph Angio: bilat pop occlusions. L - short w/ reconstituion via collats in dist pop w/ 2 vessel runoff, R long w/ reconstitution in prox tib/peroneal arteries-->med Rx w/ pletal.   Pulmonary embolism a. 2014.   Reflux esophagitis    Type II diabetes mellitus    Past Surgical History:  Procedure Laterality Date   BREAST BIOPSY Left ~ 2012   BUNIONECTOMY Left 1970s   CARDIAC CATHETERIZATION  04/2004   CARDIAC CATHETERIZATION N/A 02/21/2015   Procedure: Left Heart Cath and Cors/Grafts Angiography;  Surgeon: Lyn Records, MD;  Location: [facility] INVASIVE CV LAB;  Service: Cardiovascular;  Laterality: N/A;   CARDIAC CATHETERIZATION N/A 02/21/2015   Procedure: Coronary Stent Intervention;  Surgeon: Lyn Records, MD;  Location: [facility] INVASIVE CV LAB;  Service: Cardiovascular;  Laterality: N/A;   CARDIAC CATHETERIZATION N/A 02/21/2015   Procedure: Intravascular Pressure Wire/FFR Study;  Surgeon: Lyn Records, MD;  Location: [facility] INVASIVE CV LAB;  Service: Cardiovascular;  Laterality: N/A;   CARDIAC CATHETERIZATION N/A 06/20/2015   Procedure: Left Heart Cath and Cors/Grafts Angiography;  Surgeon: Laurey Morale, MD;  Location: [facility] INVASIVE CV LAB;  Service: Cardiovascular;  Laterality: N/A;  CARDIAC CATHETERIZATION N/A 11/19/2015   Procedure: Left Heart Cath and Cors/Grafts Angiography;  Surgeon: Peter M Swazil[place], MD;  Location: [facility] INVASIVE CV LAB;  Service: Cardiovascular;  Laterality: N/A;   CARDIAC CATHETERIZATION N/A 11/19/2015   Procedure: Coronary Stent Intervention;  Surgeon: Peter M [place], MD;  Location: [facility] INVASIVE CV LAB;  Service: Cardiovascular;  Laterality: N/A;   CATARACT EXTRACTION W/ INTRAOCULAR LENS  IMPLANT, BILATERAL Bilateral 1980s-1990s   CORONARY ANGIOPLASTY WITH STENT PLACEMENT  02/21/2015   "1 stent"   CORONARY ARTERY BYPASS GRAFT  05/2004   "CABG X4"    EYE SURGERY     LAPAROSCOPIC CHOLECYSTECTOMY  1990s   MASTECTOMY Left ~ 2012   PERIPHERAL VASCULAR CATHETERIZATION N/A 12/12/2014   Procedure: Abdominal Aortogram;  Surgeon: Iran Ouch, MD;  Location: [facility] INVASIVE CV LAB;  Service: Cardiovascular;  Laterality: N/A;   RETINAL DETACHMENT SURGERY Right 1990s     A IV Location/Drains/Wounds Patient Lines/Drains/Airways Status     Active Line/Drains/Airways     Name Placement date Placement time Site Days   Peripheral IV 04/24/22 18 G Left Antecubital 04/24/22  --  Antecubital  less than 1            Intake/Output Last 24 hours No intake or output data in the 24 hours ending 04/24/22 1803  Labs/Imaging Results for orders placed or performed during the hospital encounter of 04/24/22 (from the past 48 hour(s))  Comprehensive metabolic panel     Status: Abnormal   Collection Time: 04/24/22  4:08 PM  Result Value Ref Range   Sodium 137 135 - 145 mmol/L   Potassium 4.2 3.5 - 5.1 mmol/L   Chloride 105 98 - 111 mmol/L   CO2 21 (L) 22 - 32 mmol/L   Glucose, Bld 212 (H) 70 - 99 mg/dL    Comment: Glucose reference range applies only to samples taken after fasting for at least 8 hours.   BUN 21 8 - 23 mg/dL   Creatinine, Ser 1.61 (H) 0.44 - 1.00 mg/dL   Calcium 9.6 8.9 - 09.6 mg/dL   Total Protein 7.3 6.5 - 8.1 g/dL   Albumin 4.0 3.5 - 5.0 g/dL   AST 045 (H) 15 - 41 U/L   ALT 738 (H) 0 - 44 U/L   Alkaline Phosphatase 58 38 - 126 U/L   Total Bilirubin 1.0 0.3 - 1.2 mg/dL   GFR, Estimated 36 (L) >60 mL/min    Comment: (NOTE) Calculated using the CKD-EPI Creatinine Equation (2021)    Anion gap 11 5 - 15    Comment: Performed at [facility], [address], [place], [place] [address]  CBC     Status: Abnormal   Collection Time: 04/24/22  4:08 PM  Result Value Ref Range   WBC 9.7 4.0 - 10.5 K/uL   RBC 3.63 (L) 3.87 - 5.11 MIL/uL   Hemoglobin 11.4 (L) 12.0 - 15.0 g/dL   HCT 19.1 (L) 47.8 - 29.5 %   MCV 92.8 80.0 -  100.0 fL   MCH 31.4 26.0 - 34.0 pg   MCHC 33.8 30.0 - 36.0 g/dL   RDW 62.1 30.8 - 65.7 %   Platelets 264 150 - 400 K/uL   nRBC 0.0 0.0 - 0.2 %    Comment: Performed at [facility], [address], [place], [place] [address]  Ethanol     Status: None   Collection Time: 04/24/22  4:08 PM  Result Value Ref Range  Alcohol, Ethyl (B) <10 <10 mg/dL    Comment: (NOTE) Lowest detectable limit for serum alcohol is 10 mg/dL.  For medical purposes only. Performed at [facility], [address] [place], [place] [address]   Protime-INR     Status: None   Collection Time: 04/24/22  4:08 PM  Result Value Ref Range   Prothrombin Time 14.0 11.4 - 15.2 seconds   INR 1.1 0.8 - 1.2    Comment: (NOTE) INR goal varies based on device and disease states. Performed at [facility], [address], [place], [place] [address]   Sample to Blood Bank     Status: None   Collection Time: 04/24/22  4:23 PM  Result Value Ref Range   Blood Bank Specimen SAMPLE AVAILABLE FOR TESTING    Sample Expiration      04/27/2022,2359 Performed at [facility], [address], [place], [place] [address]   I-Stat Chem 8, ED     Status: Abnormal   Collection Time: 04/24/22  4:33 PM  Result Value Ref Range   Sodium 138 135 - 145 mmol/L   Potassium 4.2 3.5 - 5.1 mmol/L   Chloride 104 98 - 111 mmol/L   BUN 22 8 - 23 mg/dL   Creatinine, Ser 9.14 (H) 0.44 - 1.00 mg/dL   Glucose, Bld 782 (H) 70 - 99 mg/dL    Comment: Glucose reference range applies only to samples taken after fasting for at least 8 hours.   Calcium, Ion 1.24 1.15 - 1.40 mmol/L   TCO2 24 22 - 32 mmol/L   Hemoglobin 12.2 12.0 - 15.0 g/dL   HCT 95.6 21.3 - 08.6 %   CT HEAD WO CONTRAST  Result Date: 04/24/2022 CLINICAL DATA:  [id number] Pain 144615; Polytrauma, blunt; Head trauma, moderate-severe EXAM: CT HEAD WITHOUT CONTRAST CT CERVICAL SPINE WITHOUT CONTRAST CT THORACIC SPINE WITHOUT CONTRAST CT LUMBAR SPINE WITHOUT CONTRAST  TECHNIQUE: Multidetector CT imaging of the head, cervical and thoracic spine was performed following the standard protocol without intravenous contrast. Multiplanar CT image reconstructions of the cervical, thoracic and lumbar spine were also generated. RADIATION DOSE REDUCTION: This exam was performed according to the departmental dose-optimization program which includes automated exposure control, adjustment of the mA and/or kV according to patient size and/or use of iterative reconstruction technique. COMPARISON:  None Available. FINDINGS: CT HEAD FINDINGS Brain: No evidence of acute infarction, hemorrhage, hydrocephalus, extra-axial collection or mass lesion/mass effect. Vascular: No hyperdense vessel identified. Skull: No acute fracture. Sinuses/Orbits: Clear sinuses. Postoperative changes of both globes. No acute orbital findings. Other: No mastoid effusions. CT CERVICAL SPINE FINDINGS Alignment: Normal. Skull base and vertebrae: No evidence of acute fracture. Vertebral body heights are maintained. Soft tissues and spinal canal: No prevertebral fluid or swelling. No visible canal hematoma. Disc levels:  Multilevel degenerative change. Upper chest: Please see concurrent CT chest abdomen pelvis for intrathoracic evaluation, including description of small pneumothorax. CT THORACIC SPINE FINDINGS Alignment: No substantial sagittal subluxation. Skull base and vertebrae: Acute nondisplaced fracture involving the anterior and inferior T11 vertebral body with extension to involve the bridging osteophyte and T11-T12 disc space (for example see series 9, image 59; series 8, image 59). Disc levels:  Mild multilevel degenerative change. Paraspinal: Please see concurrent CT chest abdomen pelvis for intrathoracic evaluation. CT LUMBAR SPINE FINDINGS Alignment: Grade 1 anterolisthesis of L3 on L4, likely degenerative given severe facet arthropathy at this level. Skull base and vertebrae: Acute nondisplaced fracture of the  right  L1, L2, and L3 transverse processes. Vertebral body heights are maintained. Fall disc levels: Severe facet arthropathy at L3-L4. Paraspinal: Please see concurrent CT chest abdomen pelvis for intra-abdominal evaluation IMPRESSION: CT head: No acute intracranial abnormality. CT cervical, thoracic and lumbar spine: 1. Acute nondisplaced fracture involving the anterior and inferior T11 vertebral body with extension to involve the bridging osteophyte and T11-T12 disc space. 2. Acute nondisplaced fracture of the right L1, L2, and L3 transverse processes. 3. Severe facet arthropathy at L3-L4 with degenerative grade 1 anterolisthesis of L3 on L4. Findings discussed with Dr. Bedelia Person via telephone at 5:23 p.m. Electronically Signed   By: Feliberto Harts M.D.   On: 04/24/2022 17:26   CT CERVICAL SPINE WO CONTRAST  Result Date: 04/24/2022 CLINICAL DATA:  [id number] Pain 144615; Polytrauma, blunt; Head trauma, moderate-severe EXAM: CT HEAD WITHOUT CONTRAST CT CERVICAL SPINE WITHOUT CONTRAST CT THORACIC SPINE WITHOUT CONTRAST CT LUMBAR SPINE WITHOUT CONTRAST TECHNIQUE: Multidetector CT imaging of the head, cervical and thoracic spine was performed following the standard protocol without intravenous contrast. Multiplanar CT image reconstructions of the cervical, thoracic and lumbar spine were also generated. RADIATION DOSE REDUCTION: This exam was performed according to the departmental dose-optimization program which includes automated exposure control, adjustment of the mA and/or kV according to patient size and/or use of iterative reconstruction technique. COMPARISON:  None Available. FINDINGS: CT HEAD FINDINGS Brain: No evidence of acute infarction, hemorrhage, hydrocephalus, extra-axial collection or mass lesion/mass effect. Vascular: No hyperdense vessel identified. Skull: No acute fracture. Sinuses/Orbits: Clear sinuses. Postoperative changes of both globes. No acute orbital findings. Other: No mastoid effusions. CT  CERVICAL SPINE FINDINGS Alignment: Normal. Skull base and vertebrae: No evidence of acute fracture. Vertebral body heights are maintained. Soft tissues and spinal canal: No prevertebral fluid or swelling. No visible canal hematoma. Disc levels:  Multilevel degenerative change. Upper chest: Please see concurrent CT chest abdomen pelvis for intrathoracic evaluation, including description of small pneumothorax. CT THORACIC SPINE FINDINGS Alignment: No substantial sagittal subluxation. Skull base and vertebrae: Acute nondisplaced fracture involving the anterior and inferior T11 vertebral body with extension to involve the bridging osteophyte and T11-T12 disc space (for example see series 9, image 59; series 8, image 59). Disc levels:  Mild multilevel degenerative change. Paraspinal: Please see concurrent CT chest abdomen pelvis for intrathoracic evaluation. CT LUMBAR SPINE FINDINGS Alignment: Grade 1 anterolisthesis of L3 on L4, likely degenerative given severe facet arthropathy at this level. Skull base and vertebrae: Acute nondisplaced fracture of the right L1, L2, and L3 transverse processes. Vertebral body heights are maintained. Fall disc levels: Severe facet arthropathy at L3-L4. Paraspinal: Please see concurrent CT chest abdomen pelvis for intra-abdominal evaluation IMPRESSION: CT head: No acute intracranial abnormality. CT cervical, thoracic and lumbar spine: 1. Acute nondisplaced fracture involving the anterior and inferior T11 vertebral body with extension to involve the bridging osteophyte and T11-T12 disc space. 2. Acute nondisplaced fracture of the right L1, L2, and L3 transverse processes. 3. Severe facet arthropathy at L3-L4 with degenerative grade 1 anterolisthesis of L3 on L4. Findings discussed with Dr. Bedelia Person via telephone at 5:23 p.m. Electronically Signed   By: Feliberto Harts M.D.   On: 04/24/2022 17:26   CT L-SPINE NO CHARGE  Result Date: 04/24/2022 CLINICAL DATA:  [id number] Pain 144615;  Polytrauma, blunt; Head trauma, moderate-severe EXAM: CT HEAD WITHOUT CONTRAST CT CERVICAL SPINE WITHOUT CONTRAST CT THORACIC SPINE WITHOUT CONTRAST CT LUMBAR SPINE WITHOUT CONTRAST TECHNIQUE: Multidetector CT imaging of the head, cervical and thoracic spine was  performed following the standard protocol without intravenous contrast. Multiplanar CT image reconstructions of the cervical, thoracic and lumbar spine were also generated. RADIATION DOSE REDUCTION: This exam was performed according to the departmental dose-optimization program which includes automated exposure control, adjustment of the mA and/or kV according to patient size and/or use of iterative reconstruction technique. COMPARISON:  None Available. FINDINGS: CT HEAD FINDINGS Brain: No evidence of acute infarction, hemorrhage, hydrocephalus, extra-axial collection or mass lesion/mass effect. Vascular: No hyperdense vessel identified. Skull: No acute fracture. Sinuses/Orbits: Clear sinuses. Postoperative changes of both globes. No acute orbital findings. Other: No mastoid effusions. CT CERVICAL SPINE FINDINGS Alignment: Normal. Skull base and vertebrae: No evidence of acute fracture. Vertebral body heights are maintained. Soft tissues and spinal canal: No prevertebral fluid or swelling. No visible canal hematoma. Disc levels:  Multilevel degenerative change. Upper chest: Please see concurrent CT chest abdomen pelvis for intrathoracic evaluation, including description of small pneumothorax. CT THORACIC SPINE FINDINGS Alignment: No substantial sagittal subluxation. Skull base and vertebrae: Acute nondisplaced fracture involving the anterior and inferior T11 vertebral body with extension to involve the bridging osteophyte and T11-T12 disc space (for example see series 9, image 59; series 8, image 59). Disc levels:  Mild multilevel degenerative change. Paraspinal: Please see concurrent CT chest abdomen pelvis for intrathoracic evaluation. CT LUMBAR SPINE  FINDINGS Alignment: Grade 1 anterolisthesis of L3 on L4, likely degenerative given severe facet arthropathy at this level. Skull base and vertebrae: Acute nondisplaced fracture of the right L1, L2, and L3 transverse processes. Vertebral body heights are maintained. Fall disc levels: Severe facet arthropathy at L3-L4. Paraspinal: Please see concurrent CT chest abdomen pelvis for intra-abdominal evaluation IMPRESSION: CT head: No acute intracranial abnormality. CT cervical, thoracic and lumbar spine: 1. Acute nondisplaced fracture involving the anterior and inferior T11 vertebral body with extension to involve the bridging osteophyte and T11-T12 disc space. 2. Acute nondisplaced fracture of the right L1, L2, and L3 transverse processes. 3. Severe facet arthropathy at L3-L4 with degenerative grade 1 anterolisthesis of L3 on L4. Findings discussed with Dr. Bedelia Person via telephone at 5:23 p.m. Electronically Signed   By: Feliberto Harts M.D.   On: 04/24/2022 17:26   CT T-SPINE NO CHARGE  Result Date: 04/24/2022 CLINICAL DATA:  [id number] Pain 144615; Polytrauma, blunt; Head trauma, moderate-severe EXAM: CT HEAD WITHOUT CONTRAST CT CERVICAL SPINE WITHOUT CONTRAST CT THORACIC SPINE WITHOUT CONTRAST CT LUMBAR SPINE WITHOUT CONTRAST TECHNIQUE: Multidetector CT imaging of the head, cervical and thoracic spine was performed following the standard protocol without intravenous contrast. Multiplanar CT image reconstructions of the cervical, thoracic and lumbar spine were also generated. RADIATION DOSE REDUCTION: This exam was performed according to the departmental dose-optimization program which includes automated exposure control, adjustment of the mA and/or kV according to patient size and/or use of iterative reconstruction technique. COMPARISON:  None Available. FINDINGS: CT HEAD FINDINGS Brain: No evidence of acute infarction, hemorrhage, hydrocephalus, extra-axial collection or mass lesion/mass effect. Vascular: No  hyperdense vessel identified. Skull: No acute fracture. Sinuses/Orbits: Clear sinuses. Postoperative changes of both globes. No acute orbital findings. Other: No mastoid effusions. CT CERVICAL SPINE FINDINGS Alignment: Normal. Skull base and vertebrae: No evidence of acute fracture. Vertebral body heights are maintained. Soft tissues and spinal canal: No prevertebral fluid or swelling. No visible canal hematoma. Disc levels:  Multilevel degenerative change. Upper chest: Please see concurrent CT chest abdomen pelvis for intrathoracic evaluation, including description of small pneumothorax. CT THORACIC SPINE FINDINGS Alignment: No substantial sagittal subluxation. Skull base and  vertebrae: Acute nondisplaced fracture involving the anterior and inferior T11 vertebral body with extension to involve the bridging osteophyte and T11-T12 disc space (for example see series 9, image 59; series 8, image 59). Disc levels:  Mild multilevel degenerative change. Paraspinal: Please see concurrent CT chest abdomen pelvis for intrathoracic evaluation. CT LUMBAR SPINE FINDINGS Alignment: Grade 1 anterolisthesis of L3 on L4, likely degenerative given severe facet arthropathy at this level. Skull base and vertebrae: Acute nondisplaced fracture of the right L1, L2, and L3 transverse processes. Vertebral body heights are maintained. Fall disc levels: Severe facet arthropathy at L3-L4. Paraspinal: Please see concurrent CT chest abdomen pelvis for intra-abdominal evaluation IMPRESSION: CT head: No acute intracranial abnormality. CT cervical, thoracic and lumbar spine: 1. Acute nondisplaced fracture involving the anterior and inferior T11 vertebral body with extension to involve the bridging osteophyte and T11-T12 disc space. 2. Acute nondisplaced fracture of the right L1, L2, and L3 transverse processes. 3. Severe facet arthropathy at L3-L4 with degenerative grade 1 anterolisthesis of L3 on L4. Findings discussed with Dr. Bedelia Person via  telephone at 5:23 p.m. Electronically Signed   By: Feliberto Harts M.D.   On: 04/24/2022 17:26   CT CHEST ABDOMEN PELVIS W CONTRAST  Result Date: 04/24/2022 CLINICAL DATA:  Blunt trauma. EXAM: CT CHEST, ABDOMEN, AND PELVIS WITH CONTRAST TECHNIQUE: Multidetector CT imaging of the chest, abdomen and pelvis was performed following the standard protocol during bolus administration of intravenous contrast. RADIATION DOSE REDUCTION: This exam was performed according to the departmental dose-optimization program which includes automated exposure control, adjustment of the mA and/or kV according to patient size and/or use of iterative reconstruction technique. CONTRAST:  75 mL Omnipaque 350 intravenously. COMPARISON:  February 23, 2017.  November 10, 2016. FINDINGS: CT CHEST FINDINGS Cardiovascular: Status post coronary bypass graft. Atherosclerosis of thoracic aorta is noted without aneurysm or dissection. Mild cardiomegaly. No pericardial effusion. Mediastinum/Nodes: No enlarged mediastinal, hilar, or axillary lymph nodes. Thyroid gland, trachea, and esophagus demonstrate no significant findings. Lungs/Pleura: Small right anterior pneumothorax is noted. Minimal posterior basilar subsegmental atelectasis is noted bilaterally. No significant effusion is noted. Stable 6 mm right lower lobe nodule is noted which can be considered benign at this point. Musculoskeletal: Moderately to severely displaced fractures are seen involving the posterior portions of the right eleventh and tenth ribs. Overlying subcutaneous soft tissue gas and contusion is noted. Probable minimally displaced fracture involving the anterior and inferior aspect of T11 vertebral body. CT ABDOMEN PELVIS FINDINGS Hepatobiliary: Probable hepatic steatosis. Status post cholecystectomy. No biliary dilatation is noted. Pancreas: Unremarkable. No pancreatic ductal dilatation or surrounding inflammatory changes. Spleen: Normal in size without focal abnormality.  Adrenals/Urinary Tract: Adrenal glands are unremarkable. Kidneys are normal, without renal calculi, focal lesion, or hydronephrosis. Bladder is unremarkable. Stomach/Bowel: Stomach is within normal limits. Appendix appears normal. No evidence of bowel wall thickening, distention, or inflammatory changes. Vascular/Lymphatic: Aortic atherosclerosis. No enlarged abdominal or pelvic lymph nodes. Reproductive: Uterus and bilateral adnexa are unremarkable. Other: No abdominal wall hernia or abnormality. No abdominopelvic ascites. Musculoskeletal: No acute or significant osseous findings. IMPRESSION: Small right-sided anterior pneumothorax is noted. Critical Value/emergent results were called by telephone at the time of interpretation on 04/24/2022 at 5:15 pm to provider Dr. Bedelia Person, who verbally acknowledged these results. Moderately to severely displaced fractures are seen involving the posterior portions of the right eleventh and twelfth ribs. Large amount of overlying subcutaneous gas and contusion is seen. Probable minimally displaced fracture involving anterior and inferior aspect of T11 vertebral  body. Please see dedicated thoracic spine CT for more details. Probable hepatic steatosis. No other traumatic injury seen in the chest, abdomen or pelvis. Aortic Atherosclerosis (ICD10-I70.0). Electronically Signed   By: Lupita Raider M.D.   On: 04/24/2022 17:20   DG Pelvis Portable  Result Date: 04/24/2022 CLINICAL DATA:  Trauma EXAM: PORTABLE PELVIS 1 VIEWS COMPARISON:  None Available. FINDINGS: There is no evidence of pelvic fracture or diastasis. No pelvic bone lesions are seen. IMPRESSION: No acute fracture or dislocation. Electronically Signed   By: Agustin Cree M.D.   On: 04/24/2022 16:55   DG Chest Port 1 View  Result Date: 04/24/2022 CLINICAL DATA:  Trauma. EXAM: PORTABLE CHEST 1 VIEW COMPARISON:  February 22, 2017. FINDINGS: Stable cardiomediastinal silhouette. Sternotomy wires are noted. Both lungs are  clear. The visualized skeletal structures are unremarkable. IMPRESSION: No active disease. Electronically Signed   By: Lupita Raider M.D.   On: 04/24/2022 16:54    Pending Labs Unresulted Labs (From admission, onward)     Start     Ordered   04/24/22 1608  Urinalysis, Routine w reflex microscopic -Urine, Clean Catch  (Trauma Panel)  Once,   URGENT       Question:  Specimen Source  Answer:  Urine, Clean Catch   04/24/22 1607   04/24/22 1608  Lactic acid, plasma  (Trauma Panel)  Once,   STAT        04/24/22 1607            Vitals/Pain Today's Vitals   04/24/22 1616 04/24/22 1616 04/24/22 1620 04/24/22 1803  BP: 102/62     SpO2:   99%   PainSc:  10-Worst pain ever  9     Isolation Precautions No active isolations  Medications Medications  sodium chloride 0.9 % bolus 1,000 mL (1,000 mLs Intravenous New Bag/Given 04/24/22 1612)    And  0.9 %  sodium chloride infusion (has no administration in time range)  acetaminophen (TYLENOL) tablet 1,000 mg (1,000 mg Oral Given 04/24/22 1737)  methocarbamol (ROBAXIN) tablet 1,000 mg (1,000 mg Oral Given 04/24/22 1739)  oxyCODONE (Oxy IR/ROXICODONE) immediate release tablet 2.5-5 mg (has no administration in time range)  ketorolac (TORADOL) 15 MG/ML injection 15 mg (0 mg Intravenous Hold 04/24/22 1757)  morphine (PF) 2 MG/ML injection 1-2 mg (2 mg Intravenous Given 04/24/22 1738)  docusate sodium (COLACE) capsule 100 mg (has no administration in time range)  ondansetron (ZOFRAN-ODT) disintegrating tablet 4 mg ( Oral See Alternative 04/24/22 1738)    Or  ondansetron (ZOFRAN) injection 4 mg (4 mg Intravenous Given 04/24/22 1738)  enoxaparin (LOVENOX) injection 30 mg (has no administration in time range)  iohexol (OMNIPAQUE) 350 MG/ML injection 75 mL (75 mLs Intravenous Contrast Given 04/24/22 1721)    Mobility walks     Focused Assessments    R Recommendations: See Admitting Provider Note  Report given to:   Additional Notes:

## 2022-04-24 NOTE — Progress Notes (Signed)
   04/24/22 1637  Spiritual Encounters  Type of Visit Initial  Care provided to: Pt and family  Conversation partners present during encounter Nurse  Referral source Trauma page  Reason for visit Trauma  OnCall Visit No   Chap responded to Trauma page, fall on thinners.  PT initially unavailable as medical team provided care.  Chaplain was able to find out from PT that her husband was coming to hospital.  Mirna Mires located husband in waiting area and conducted him to bedside. Chaplain remains available by page when needed.

## 2022-04-24 NOTE — Plan of Care (Signed)

## 2022-04-24 NOTE — TOC CAGE-AID Note (Signed)
Transition of Care [facility]) - CAGE-AID Screening   Patient Details  Name: Taylor [name] MRN: [id number] Date of Birth: 12/05/1945  Transition of Care [facility]) CM/SW Contact:    Janora Norlander, RN Phone Number: [phone] 04/24/2022, 7:23 PM   Clinical Narrative: Pt here after falling through a deck. Pt denies alcohol or drug abuse. Screening complete.   CAGE-AID Screening:    Have You Ever Felt You Ought to Cut Down on Your Drinking or Drug Use?: No Have People Annoyed You By Critizing Your Drinking Or Drug Use?: No Have You Felt Bad Or Guilty About Your Drinking Or Drug Use?: No Have You Ever Had a Drink or Used Drugs First Thing In The Morning to Steady Your Nerves or to Get Rid of a Hangover?: No CAGE-AID Score: 0  Substance Abuse Education Offered: No

## 2022-04-25 ENCOUNTER — Observation Stay (HOSPITAL_COMMUNITY): Payer: Medicare Other

## 2022-04-25 ENCOUNTER — Encounter (HOSPITAL_COMMUNITY): Payer: Self-pay

## 2022-04-25 DIAGNOSIS — J9690 Respiratory failure, unspecified, unspecified whether with hypoxia or hypercapnia: Secondary | ICD-10-CM | POA: Diagnosis not present

## 2022-04-25 DIAGNOSIS — I959 Hypotension, unspecified: Secondary | ICD-10-CM | POA: Diagnosis present

## 2022-04-25 DIAGNOSIS — S81811A Laceration without foreign body, right lower leg, initial encounter: Secondary | ICD-10-CM | POA: Diagnosis present

## 2022-04-25 DIAGNOSIS — S22009A Unspecified fracture of unspecified thoracic vertebra, initial encounter for closed fracture: Secondary | ICD-10-CM | POA: Diagnosis not present

## 2022-04-25 DIAGNOSIS — Z9012 Acquired absence of left breast and nipple: Secondary | ICD-10-CM | POA: Diagnosis not present

## 2022-04-25 DIAGNOSIS — S32039A Unspecified fracture of third lumbar vertebra, initial encounter for closed fracture: Secondary | ICD-10-CM | POA: Diagnosis present

## 2022-04-25 DIAGNOSIS — E1122 Type 2 diabetes mellitus with diabetic chronic kidney disease: Secondary | ICD-10-CM | POA: Diagnosis present

## 2022-04-25 DIAGNOSIS — J9811 Atelectasis: Secondary | ICD-10-CM | POA: Diagnosis not present

## 2022-04-25 DIAGNOSIS — S32029A Unspecified fracture of second lumbar vertebra, initial encounter for closed fracture: Secondary | ICD-10-CM | POA: Diagnosis present

## 2022-04-25 DIAGNOSIS — S270XXA Traumatic pneumothorax, initial encounter: Secondary | ICD-10-CM | POA: Diagnosis present

## 2022-04-25 DIAGNOSIS — S2241XD Multiple fractures of ribs, right side, subsequent encounter for fracture with routine healing: Secondary | ICD-10-CM | POA: Diagnosis present

## 2022-04-25 DIAGNOSIS — E119 Type 2 diabetes mellitus without complications: Secondary | ICD-10-CM | POA: Diagnosis not present

## 2022-04-25 DIAGNOSIS — J439 Emphysema, unspecified: Secondary | ICD-10-CM | POA: Diagnosis not present

## 2022-04-25 DIAGNOSIS — D62 Acute posthemorrhagic anemia: Secondary | ICD-10-CM | POA: Diagnosis present

## 2022-04-25 DIAGNOSIS — M4316 Spondylolisthesis, lumbar region: Secondary | ICD-10-CM | POA: Diagnosis present

## 2022-04-25 DIAGNOSIS — I1310 Hypertensive heart and chronic kidney disease without heart failure, with stage 1 through stage 4 chronic kidney disease, or unspecified chronic kidney disease: Secondary | ICD-10-CM | POA: Diagnosis present

## 2022-04-25 DIAGNOSIS — T1490XA Injury, unspecified, initial encounter: Secondary | ICD-10-CM | POA: Diagnosis not present

## 2022-04-25 DIAGNOSIS — W133XXA Fall through floor, initial encounter: Secondary | ICD-10-CM | POA: Diagnosis present

## 2022-04-25 DIAGNOSIS — S22018A Other fracture of first thoracic vertebra, initial encounter for closed fracture: Secondary | ICD-10-CM | POA: Diagnosis not present

## 2022-04-25 DIAGNOSIS — Z79890 Hormone replacement therapy (postmenopausal): Secondary | ICD-10-CM | POA: Diagnosis not present

## 2022-04-25 DIAGNOSIS — S22089A Unspecified fracture of T11-T12 vertebra, initial encounter for closed fracture: Secondary | ICD-10-CM | POA: Diagnosis present

## 2022-04-25 DIAGNOSIS — Y92019 Unspecified place in single-family (private) house as the place of occurrence of the external cause: Secondary | ICD-10-CM | POA: Diagnosis not present

## 2022-04-25 DIAGNOSIS — K5901 Slow transit constipation: Secondary | ICD-10-CM | POA: Diagnosis not present

## 2022-04-25 DIAGNOSIS — S2241XA Multiple fractures of ribs, right side, initial encounter for closed fracture: Secondary | ICD-10-CM | POA: Diagnosis present

## 2022-04-25 DIAGNOSIS — J90 Pleural effusion, not elsewhere classified: Secondary | ICD-10-CM | POA: Diagnosis not present

## 2022-04-25 DIAGNOSIS — S2231XA Fracture of one rib, right side, initial encounter for closed fracture: Secondary | ICD-10-CM | POA: Diagnosis not present

## 2022-04-25 DIAGNOSIS — M47814 Spondylosis without myelopathy or radiculopathy, thoracic region: Secondary | ICD-10-CM | POA: Diagnosis present

## 2022-04-25 DIAGNOSIS — E1140 Type 2 diabetes mellitus with diabetic neuropathy, unspecified: Secondary | ICD-10-CM | POA: Diagnosis present

## 2022-04-25 DIAGNOSIS — I129 Hypertensive chronic kidney disease with stage 1 through stage 4 chronic kidney disease, or unspecified chronic kidney disease: Secondary | ICD-10-CM | POA: Diagnosis present

## 2022-04-25 DIAGNOSIS — T07XXXA Unspecified multiple injuries, initial encounter: Secondary | ICD-10-CM | POA: Diagnosis not present

## 2022-04-25 DIAGNOSIS — S2249XA Multiple fractures of ribs, unspecified side, initial encounter for closed fracture: Secondary | ICD-10-CM | POA: Diagnosis present

## 2022-04-25 DIAGNOSIS — N189 Chronic kidney disease, unspecified: Secondary | ICD-10-CM | POA: Diagnosis present

## 2022-04-25 DIAGNOSIS — I2510 Atherosclerotic heart disease of native coronary artery without angina pectoris: Secondary | ICD-10-CM | POA: Diagnosis present

## 2022-04-25 DIAGNOSIS — Z87891 Personal history of nicotine dependence: Secondary | ICD-10-CM | POA: Diagnosis not present

## 2022-04-25 DIAGNOSIS — N1831 Chronic kidney disease, stage 3a: Secondary | ICD-10-CM | POA: Diagnosis present

## 2022-04-25 DIAGNOSIS — E1165 Type 2 diabetes mellitus with hyperglycemia: Secondary | ICD-10-CM | POA: Diagnosis not present

## 2022-04-25 DIAGNOSIS — E039 Hypothyroidism, unspecified: Secondary | ICD-10-CM | POA: Diagnosis present

## 2022-04-25 DIAGNOSIS — Z951 Presence of aortocoronary bypass graft: Secondary | ICD-10-CM | POA: Diagnosis not present

## 2022-04-25 DIAGNOSIS — J939 Pneumothorax, unspecified: Secondary | ICD-10-CM | POA: Diagnosis not present

## 2022-04-25 DIAGNOSIS — E871 Hypo-osmolality and hyponatremia: Secondary | ICD-10-CM | POA: Diagnosis present

## 2022-04-25 DIAGNOSIS — F419 Anxiety disorder, unspecified: Secondary | ICD-10-CM | POA: Diagnosis present

## 2022-04-25 DIAGNOSIS — Z85820 Personal history of malignant melanoma of skin: Secondary | ICD-10-CM | POA: Diagnosis not present

## 2022-04-25 DIAGNOSIS — G4700 Insomnia, unspecified: Secondary | ICD-10-CM | POA: Diagnosis present

## 2022-04-25 DIAGNOSIS — S32018A Other fracture of first lumbar vertebra, initial encounter for closed fracture: Secondary | ICD-10-CM | POA: Diagnosis not present

## 2022-04-25 DIAGNOSIS — I700 Atherosclerosis of aorta: Secondary | ICD-10-CM | POA: Diagnosis not present

## 2022-04-25 DIAGNOSIS — S22089D Unspecified fracture of T11-T12 vertebra, subsequent encounter for fracture with routine healing: Secondary | ICD-10-CM | POA: Diagnosis not present

## 2022-04-25 DIAGNOSIS — N179 Acute kidney failure, unspecified: Secondary | ICD-10-CM | POA: Diagnosis present

## 2022-04-25 DIAGNOSIS — R7401 Elevation of levels of liver transaminase levels: Secondary | ICD-10-CM | POA: Diagnosis not present

## 2022-04-25 DIAGNOSIS — S80811A Abrasion, right lower leg, initial encounter: Secondary | ICD-10-CM | POA: Diagnosis present

## 2022-04-25 DIAGNOSIS — K219 Gastro-esophageal reflux disease without esophagitis: Secondary | ICD-10-CM | POA: Diagnosis present

## 2022-04-25 DIAGNOSIS — S32019A Unspecified fracture of first lumbar vertebra, initial encounter for closed fracture: Secondary | ICD-10-CM | POA: Diagnosis present

## 2022-04-25 DIAGNOSIS — E7800 Pure hypercholesterolemia, unspecified: Secondary | ICD-10-CM | POA: Diagnosis present

## 2022-04-25 DIAGNOSIS — S60812A Abrasion of left wrist, initial encounter: Secondary | ICD-10-CM | POA: Diagnosis present

## 2022-04-25 DIAGNOSIS — F32A Depression, unspecified: Secondary | ICD-10-CM | POA: Diagnosis present

## 2022-04-25 DIAGNOSIS — Z853 Personal history of malignant neoplasm of breast: Secondary | ICD-10-CM | POA: Diagnosis not present

## 2022-04-25 DIAGNOSIS — E872 Acidosis, unspecified: Secondary | ICD-10-CM | POA: Diagnosis present

## 2022-04-25 DIAGNOSIS — S50312A Abrasion of left elbow, initial encounter: Secondary | ICD-10-CM | POA: Diagnosis present

## 2022-04-25 DIAGNOSIS — E1151 Type 2 diabetes mellitus with diabetic peripheral angiopathy without gangrene: Secondary | ICD-10-CM | POA: Diagnosis present

## 2022-04-25 LAB — URINALYSIS, ROUTINE W REFLEX MICROSCOPIC
Bacteria, UA: NONE SEEN
Bilirubin Urine: NEGATIVE
Glucose, UA: NEGATIVE mg/dL
Hgb urine dipstick: NEGATIVE
Ketones, ur: NEGATIVE mg/dL
Leukocytes,Ua: NEGATIVE
Nitrite: NEGATIVE
Protein, ur: 30 mg/dL — AB
Specific Gravity, Urine: 1.045 — ABNORMAL HIGH (ref 1.005–1.030)
pH: 5 (ref 5.0–8.0)

## 2022-04-25 LAB — HEMOGLOBIN A1C
Hgb A1c MFr Bld: 7.6 % — ABNORMAL HIGH (ref 4.8–5.6)
Mean Plasma Glucose: 171.42 mg/dL

## 2022-04-25 LAB — CBC
HCT: 28.4 % — ABNORMAL LOW (ref 36.0–46.0)
Hemoglobin: 9.5 g/dL — ABNORMAL LOW (ref 12.0–15.0)
MCH: 31.1 pg (ref 26.0–34.0)
MCHC: 33.5 g/dL (ref 30.0–36.0)
MCV: 93.1 fL (ref 80.0–100.0)
Platelets: 205 10*3/uL (ref 150–400)
RBC: 3.05 MIL/uL — ABNORMAL LOW (ref 3.87–5.11)
RDW: 13.3 % (ref 11.5–15.5)
WBC: 6.1 10*3/uL (ref 4.0–10.5)
nRBC: 0 % (ref 0.0–0.2)

## 2022-04-25 LAB — LACTIC ACID, PLASMA: Lactic Acid, Venous: 2.1 mmol/L (ref 0.5–1.9)

## 2022-04-25 LAB — GLUCOSE, CAPILLARY
Glucose-Capillary: 237 mg/dL — ABNORMAL HIGH (ref 70–99)
Glucose-Capillary: 214 mg/dL — ABNORMAL HIGH (ref 70–99)
Glucose-Capillary: 217 mg/dL — ABNORMAL HIGH (ref 70–99)
Glucose-Capillary: 192 mg/dL — ABNORMAL HIGH (ref 70–99)

## 2022-04-25 LAB — BASIC METABOLIC PANEL
Anion gap: 12 (ref 5–15)
BUN: 26 mg/dL — ABNORMAL HIGH (ref 8–23)
CO2: 20 mmol/L — ABNORMAL LOW (ref 22–32)
Calcium: 8.7 mg/dL — ABNORMAL LOW (ref 8.9–10.3)
Chloride: 103 mmol/L (ref 98–111)
Creatinine, Ser: 1.8 mg/dL — ABNORMAL HIGH (ref 0.44–1.00)
GFR, Estimated: 29 mL/min — ABNORMAL LOW (ref >60)
Glucose, Bld: 157 mg/dL — ABNORMAL HIGH (ref 70–99)
Potassium: 4.7 mmol/L (ref 3.5–5.1)
Sodium: 135 mmol/L (ref 135–145)

## 2022-04-25 MED ORDER — SERTRALINE HCL 100 MG PO TABS
100.0000 mg | ORAL_TABLET | Freq: Every day | ORAL | Status: DC
  Administered 2022-04-25 – 2022-05-01 (×7): 100 mg via ORAL
  Filled 2022-04-25 (×7): qty 1

## 2022-04-25 MED ORDER — INSULIN ASPART 100 UNIT/ML IJ SOLN
0.0000 [IU] | Freq: Three times a day (TID) | INTRAMUSCULAR | Status: DC
  Administered 2022-04-25 (×2): 5 [IU] via SUBCUTANEOUS
  Administered 2022-04-26: 3 [IU] via SUBCUTANEOUS
  Administered 2022-04-26: 8 [IU] via SUBCUTANEOUS
  Administered 2022-04-26: 3 [IU] via SUBCUTANEOUS
  Administered 2022-04-27 (×2): 2 [IU] via SUBCUTANEOUS
  Administered 2022-04-27: 3 [IU] via SUBCUTANEOUS
  Administered 2022-04-28 – 2022-04-29 (×4): 2 [IU] via SUBCUTANEOUS
  Administered 2022-04-29 (×2): 3 [IU] via SUBCUTANEOUS
  Administered 2022-04-30: 2 [IU] via SUBCUTANEOUS
  Administered 2022-04-30: 3 [IU] via SUBCUTANEOUS
  Administered 2022-04-30: 5 [IU] via SUBCUTANEOUS
  Administered 2022-05-01 (×2): 3 [IU] via SUBCUTANEOUS

## 2022-04-25 MED ORDER — LIDOCAINE 5 % EX PTCH
1.0000 | MEDICATED_PATCH | CUTANEOUS | Status: DC
  Administered 2022-04-25 – 2022-05-01 (×7): 1 via TRANSDERMAL
  Filled 2022-04-25 (×7): qty 1

## 2022-04-25 MED ORDER — INSULIN ASPART 100 UNIT/ML IJ SOLN: 0.0000 [IU] | Freq: Every day | INTRAMUSCULAR | Status: DC

## 2022-04-25 MED ORDER — ALPRAZOLAM 0.25 MG PO TABS: 0.2500 mg | ORAL_TABLET | Freq: Every day | ORAL | Status: DC | PRN

## 2022-04-25 MED ORDER — LISINOPRIL 10 MG PO TABS: 10.0000 mg | ORAL_TABLET | Freq: Every day | ORAL | Status: DC

## 2022-04-25 MED ORDER — LEVOTHYROXINE SODIUM 75 MCG PO TABS
75.0000 ug | ORAL_TABLET | Freq: Every day | ORAL | Status: DC
  Administered 2022-04-25 – 2022-05-01 (×7): 75 ug via ORAL
  Filled 2022-04-25 (×7): qty 1

## 2022-04-25 MED ORDER — ISOSORBIDE MONONITRATE ER 60 MG PO TB24
60.0000 mg | ORAL_TABLET | Freq: Every day | ORAL | Status: DC
  Administered 2022-04-25 – 2022-05-01 (×7): 60 mg via ORAL
  Filled 2022-04-25 (×7): qty 1

## 2022-04-25 MED ORDER — POLYETHYLENE GLYCOL 3350 17 G PO PACK
17.0000 g | PACK | Freq: Every day | ORAL | Status: DC
  Administered 2022-04-25 – 2022-05-01 (×6): 17 g via ORAL
  Filled 2022-04-25 (×7): qty 1

## 2022-04-25 NOTE — Evaluation (Signed)
Occupational Therapy Evaluation Patient Details Name: Taylor [name] MRN: [id number] DOB: [birth year] Today's Date: 04/25/2022   History of Present Illness Pt is 76 year old female who was walking on her deck and fell through the boards. She has suffered right-sided rib fxs and fx at T11 and T12 and a soft tissue injury to the right flank. Pt has history of L breast cancer, hypertension, PAD, Pulmonary emobolism, and DMII.   Clinical Impression   Pt is s/p above mentioned injury to back/ribs after fall.  Pt PLOF independent but currently requires assistance with ADLs and mobility due to back precautions and pain.  Pt lives at home with husband who is able to assist as needed with ADLs/IADLs. Pt would greatly benefit from continued skilled therapy to instruct in compensatory strategies for ADLs, and follow up HHOT to assist with needs at home.       Recommendations for follow up therapy are one component of a multi-disciplinary discharge planning process, led by the attending physician.  Recommendations may be updated based on patient status, additional functional criteria and insurance authorization.   Assistance Recommended at Discharge Frequent or constant Supervision/Assistance  Patient can return home with the following A little help with walking and/or transfers;A little help with bathing/dressing/bathroom;Assistance with cooking/housework;Assist for transportation;Help with stairs or ramp for entrance    Functional Status Assessment  Patient has had a recent decline in their functional status and demonstrates the ability to make significant improvements in function in a reasonable and predictable amount of time.  Equipment Recommendations  Other (comment) (RW)    Recommendations for Other Services       Precautions / Restrictions Precautions Precautions: Fall;Back Required Braces or Orthoses: Spinal Brace Spinal Brace: Thoracolumbosacral orthotic;Applied in sitting  position Restrictions Weight Bearing Restrictions: No      Mobility Bed Mobility Overal bed mobility: Needs Assistance Bed Mobility: Supine to Sit     Supine to sit: Mod assist     General bed mobility comments: mod A for sitting up and scooting to EOB    Transfers Overall transfer level: Needs assistance Equipment used: Rolling walker (2 wheels) Transfers: Bed to chair/wheelchair/BSC, Sit to/from Stand Sit to Stand: Min assist     Step pivot transfers: Min guard            Balance Overall balance assessment: Needs assistance Sitting-balance support: No upper extremity supported, Feet supported Sitting balance-Leahy Scale: Good     Standing balance support: Single extremity supported, During functional activity Standing balance-Leahy Scale: Fair                             ADL either performed or assessed with clinical judgement   ADL Overall ADL's : Needs assistance/impaired Eating/Feeding: Set up   Grooming: Min guard;Standing   Upper Body Bathing: Minimal assistance   Lower Body Bathing: Maximal assistance   Upper Body Dressing : Minimal assistance   Lower Body Dressing: Maximal assistance   Toilet Transfer: Minimal assistance   Toileting- Clothing Manipulation and Hygiene: Minimal assistance   Tub/ Shower Transfer: Minimal assistance   Functional mobility during ADLs: Minimal assistance General ADL Comments: Does well overall,     Vision Baseline Vision/History: 1 Wears glasses Ability to See in Adequate Light: 0 Adequate Patient Visual Report: No change from baseline       Perception     Praxis      Pertinent Vitals/Pain Pain Assessment Pain Assessment: 0-10 Pain Score: 4  Pain Location: R back/side Pain Descriptors / Indicators: Aching, Guarding Pain Intervention(s): Monitored during session     Hand Dominance     Extremity/Trunk Assessment Upper Extremity Assessment Upper Extremity Assessment: Overall WFL for  tasks assessed;RUE deficits/detail;LUE deficits/detail RUE Deficits / Details: pain with shoulder ABD/flexion, slight decrease in ROM, still able to complete ADLs as needed RUE: Shoulder pain with ROM LUE Deficits / Details: pain with shoulder ABD/flexion, slight decrease in ROM, still able to complete ADLs as needed LUE: Shoulder pain with ROM   Lower Extremity Assessment Lower Extremity Assessment: Defer to PT evaluation       Communication Communication Communication: No difficulties   Cognition Arousal/Alertness: Awake/alert Behavior During Therapy: WFL for tasks assessed/performed Overall Cognitive Status: Within Functional Limits for tasks assessed                                       General Comments       Exercises     Shoulder Instructions      Home Living Family/patient expects to be discharged to:: Private residence Living Arrangements: Spouse/significant other Available Help at Discharge: Family Type of Home: House Home Access: Stairs to enter Secretary/administrator of Steps: 1 Entrance Stairs-Rails: Left Home Layout: One level     Bathroom Shower/Tub: Tub/shower unit         Home Equipment: Agricultural consultant (2 wheels);Wheelchair - manual;Shower seat   Additional Comments: Pt lives at home with husband, both independent, husband is able to assist as needed, retired.      Prior Functioning/Environment Prior Level of Function : Independent/Modified Independent             Mobility Comments: independent, no AD ADLs Comments: independent        OT Problem List: Decreased strength;Decreased activity tolerance;Impaired balance (sitting and/or standing);Pain      OT Treatment/Interventions: Self-care/ADL training;Therapeutic exercise;Energy conservation;Patient/family education;DME and/or AE instruction    OT Goals(Current goals can be found in the care plan section) Acute Rehab OT Goals Patient Stated Goal: to reduce pain levels  and return home OT Goal Formulation: With patient Time For Goal Achievement: 05/09/22 Potential to Achieve Goals: Good  OT Frequency: Min 2X/week    Co-evaluation              AM-PAC OT "6 Clicks" Daily Activity     Outcome Measure Help from another person eating meals?: A Little Help from another person taking care of personal grooming?: A Little Help from another person toileting, which includes using toliet, bedpan, or urinal?: A Little Help from another person bathing (including washing, rinsing, drying)?: A Lot Help from another person to put on and taking off regular upper body clothing?: A Little Help from another person to put on and taking off regular lower body clothing?: A Lot 6 Click Score: 16   End of Session Equipment Utilized During Treatment: Rolling walker (2 wheels);Gait belt;Back brace Nurse Communication: Mobility status  Activity Tolerance: Patient tolerated treatment well Patient left: in chair;with call bell/phone within reach;with bed alarm set  OT Visit Diagnosis: Unsteadiness on feet (R26.81);Muscle weakness (generalized) (M62.81);Pain Pain - part of body:  (back)                Time: 1340-1405 OT Time Calculation (min): 25 min Charges:  OT General Charges $OT Visit: 1 Visit OT Evaluation $OT Eval Low Complexity: 1 Low OT Treatments $Self Care/Home  Management : 8-22 mins  [facility], OTR/L   Alexis Goodell 04/25/2022, 2:37 PM

## 2022-04-25 NOTE — Progress Notes (Addendum)
[facility] Progress Note     Subjective: CC:  Right posterior chest wall and back pain, worse with inspiration. Just finished 100% of breakfast.  Objective: Vital signs in last 24 hours: Temp:  [97.5 F (36.4 C)-98.2 F (36.8 C)] 97.5 F (36.4 C) (04/13 0740) Pulse Rate:  [58-65] 61 (04/13 0740) Resp:  [15-18] 15 (04/13 0740) BP: (102-138)/(50-66) 108/50 (04/13 0740) SpO2:  [94 %-100 %] 94 % (04/13 0740) Last BM Date : 04/23/22  Intake/Output from previous day: 04/12 0701 - 04/13 0700 In: -  Out: 250 [Urine:250] Intake/Output this shift: No intake/output data recorded.  PE: Gen:  Alert, NAD, pleasant and cooperative Neck: c collar removed. No midline tenderness, no pain with neck rotation. Card:  Regular rate and rhythm, pedal pulses 2+ BL Pulm:  Normal effort, clear to auscultation bilaterally Abd: Soft, non-tender, non-distended Skin: warm and dry, no rashes  Psych: A&Ox3   Lab Results:  Recent Labs    04/24/22 1608 04/24/22 1633 04/25/22 0131  WBC 9.7  --  6.1  HGB 11.4* 12.2 9.5*  HCT 33.7* 36.0 28.4*  PLT 264  --  205   BMET Recent Labs    04/24/22 1608 04/24/22 1633  NA 137 138  K 4.2 4.2  CL 105 104  CO2 21*  --   GLUCOSE 212* 211*  BUN 21 22  CREATININE 1.49* 1.40*  CALCIUM 9.6  --    PT/INR Recent Labs    04/24/22 1608  LABPROT 14.0  INR 1.1   CMP     Component Value Date/Time   NA 138 04/24/2022 1633   NA 137 07/22/2016 1018   NA 140 01/23/2016 1323   K 4.2 04/24/2022 1633   K 4.2 01/23/2016 1323   CL 104 04/24/2022 1633   CL 107 04/21/2012 0941   CO2 21 (L) 04/24/2022 1608   CO2 26 01/23/2016 1323   GLUCOSE 211 (H) 04/24/2022 1633   GLUCOSE 113 01/23/2016 1323   GLUCOSE 121 (H) 04/21/2012 0941   BUN 22 04/24/2022 1633   BUN 18 07/22/2016 1018   BUN 19.4 01/23/2016 1323   CREATININE 1.40 (H) 04/24/2022 1633   CREATININE 1.1 01/23/2016 1323   CALCIUM 9.6 04/24/2022 1608   CALCIUM 10.0 01/23/2016 1323    PROT 7.3 04/24/2022 1608   PROT 7.3 01/27/2016 1034   PROT 7.5 01/23/2016 1323   ALBUMIN 4.0 04/24/2022 1608   ALBUMIN 4.6 01/27/2016 1034   ALBUMIN 4.2 01/23/2016 1323   AST 876 (H) 04/24/2022 1608   AST 21 01/23/2016 1323   ALT 738 (H) 04/24/2022 1608   ALT 15 01/23/2016 1323   ALKPHOS 58 04/24/2022 1608   ALKPHOS 57 01/23/2016 1323   BILITOT 1.0 04/24/2022 1608   BILITOT 0.4 01/27/2016 1034   BILITOT 0.60 01/23/2016 1323   GFRNONAA 36 (L) 04/24/2022 1608   GFRAA 46 (L) 02/22/2017 2032   Lipase     Component Value Date/Time   LIPASE 61 (H) 02/22/2017 2032       Studies/Results: DG Chest Port 1 View  Result Date: 04/25/2022 CLINICAL DATA:  Traumatic pneumothorax EXAM: PORTABLE CHEST 1 VIEW COMPARISON:  Yesterday FINDINGS: Small right apical pneumothorax. Low volume chest with hazy density at the bases. Small pleural effusions are possible. Cardiopericardial enlargement. Prior CABG. IMPRESSION: 1. Small right apical pneumothorax, less than 10%. 2. Worsening aeration with atelectasis or infiltrate in the lower lobes. Electronically Signed   By: Tiburcio Pea M.D.   On: 04/25/2022 08:27  DG Hand Complete Left  Result Date: 04/24/2022 CLINICAL DATA:  Fall.  Bilateral hand pain. EXAM: LEFT HAND - COMPLETE 3+ VIEW; RIGHT HAND - COMPLETE 3+ VIEW COMPARISON:  None Available. FINDINGS: Left hand: Severe thumb carpometacarpal joint space narrowing, subchondral sclerosis, and peripheral osteophytosis. Mild triscaphe joint space narrowing. Mild distal radioulnar joint space narrowing and peripheral spurring. 5 mm well corticated chronic ossicle just distal to the ulnar styloid, the sequela of remote trauma. Mild-to-moderate thumb interphalangeal and second greater than third DIP joint space narrowing and peripheral osteophytosis. Mild fourth and minimal fifth DIP joint space narrowing and peripheral osteophytosis. There is a chronic appearing 3 x 1 mm calcific density just lateral to the  base of the proximal phalanx of the third finger. A 2 mm apparent well corticated ossicle is seen at the dorsal aspect of the fourth DIP joint, likely the sequela of remote trauma. Recommend clinical correlation for point tenderness. Right hand: Severe thumb carpometacarpal joint space narrowing, subchondral sclerosis, and peripheral osteophytosis. Moderate triscaphe joint space narrowing and subchondral sclerosis. Mild-to-moderate thumb interphalangeal joint space narrowing and peripheral osteophytosis. A pulse oximeter obscures portions of the distal index finger. There appears to be moderate index finger DIP joint space narrowing and peripheral osteophytosis. Mild-to-moderate third and mild fourth and fifth DIP and mild second and third PIP joint space narrowing and peripheral osteophytosis. No acute fracture or dislocation. IMPRESSION: LEFT HAND: 1. Severe thumb carpometacarpal osteoarthritis. 2. Mild-to-moderate thumb interphalangeal and second greater than third DIP osteoarthritis. 3. A 2 mm apparent well corticated ossicle is seen at the dorsal aspect of the fourth DIP joint, likely the sequela of remote trauma. Recommend clinical correlation for point tenderness. RIGHT HAND: 1. Severe thumb carpometacarpal and moderate triscaphe osteoarthritis. 2. Moderate index finger DIP and mild-to-moderate thumb interphalangeal osteoarthritis. Electronically Signed   By: Neita Garnet M.D.   On: 04/24/2022 18:23   DG Hand Complete Right  Result Date: 04/24/2022 CLINICAL DATA:  Fall.  Bilateral hand pain. EXAM: LEFT HAND - COMPLETE 3+ VIEW; RIGHT HAND - COMPLETE 3+ VIEW COMPARISON:  None Available. FINDINGS: Left hand: Severe thumb carpometacarpal joint space narrowing, subchondral sclerosis, and peripheral osteophytosis. Mild triscaphe joint space narrowing. Mild distal radioulnar joint space narrowing and peripheral spurring. 5 mm well corticated chronic ossicle just distal to the ulnar styloid, the sequela of  remote trauma. Mild-to-moderate thumb interphalangeal and second greater than third DIP joint space narrowing and peripheral osteophytosis. Mild fourth and minimal fifth DIP joint space narrowing and peripheral osteophytosis. There is a chronic appearing 3 x 1 mm calcific density just lateral to the base of the proximal phalanx of the third finger. A 2 mm apparent well corticated ossicle is seen at the dorsal aspect of the fourth DIP joint, likely the sequela of remote trauma. Recommend clinical correlation for point tenderness. Right hand: Severe thumb carpometacarpal joint space narrowing, subchondral sclerosis, and peripheral osteophytosis. Moderate triscaphe joint space narrowing and subchondral sclerosis. Mild-to-moderate thumb interphalangeal joint space narrowing and peripheral osteophytosis. A pulse oximeter obscures portions of the distal index finger. There appears to be moderate index finger DIP joint space narrowing and peripheral osteophytosis. Mild-to-moderate third and mild fourth and fifth DIP and mild second and third PIP joint space narrowing and peripheral osteophytosis. No acute fracture or dislocation. IMPRESSION: LEFT HAND: 1. Severe thumb carpometacarpal osteoarthritis. 2. Mild-to-moderate thumb interphalangeal and second greater than third DIP osteoarthritis. 3. A 2 mm apparent well corticated ossicle is seen at the dorsal aspect of the fourth  DIP joint, likely the sequela of remote trauma. Recommend clinical correlation for point tenderness. RIGHT HAND: 1. Severe thumb carpometacarpal and moderate triscaphe osteoarthritis. 2. Moderate index finger DIP and mild-to-moderate thumb interphalangeal osteoarthritis. Electronically Signed   By: Neita Garnet M.D.   On: 04/24/2022 18:23   CT HEAD WO CONTRAST  Result Date: 04/24/2022 CLINICAL DATA:  [id number] Pain [id number]; Polytrauma, blunt; Head trauma, moderate-severe EXAM: CT HEAD WITHOUT CONTRAST CT CERVICAL SPINE WITHOUT CONTRAST CT THORACIC  SPINE WITHOUT CONTRAST CT LUMBAR SPINE WITHOUT CONTRAST TECHNIQUE: Multidetector CT imaging of the head, cervical and thoracic spine was performed following the standard protocol without intravenous contrast. Multiplanar CT image reconstructions of the cervical, thoracic and lumbar spine were also generated. RADIATION DOSE REDUCTION: This exam was performed according to the departmental dose-optimization program which includes automated exposure control, adjustment of the mA and/or kV according to patient size and/or use of iterative reconstruction technique. COMPARISON:  None Available. FINDINGS: CT HEAD FINDINGS Brain: No evidence of acute infarction, hemorrhage, hydrocephalus, extra-axial collection or mass lesion/mass effect. Vascular: No hyperdense vessel identified. Skull: No acute fracture. Sinuses/Orbits: Clear sinuses. Postoperative changes of both globes. No acute orbital findings. Other: No mastoid effusions. CT CERVICAL SPINE FINDINGS Alignment: Normal. Skull base and vertebrae: No evidence of acute fracture. Vertebral body heights are maintained. Soft tissues and spinal canal: No prevertebral fluid or swelling. No visible canal hematoma. Disc levels:  Multilevel degenerative change. Upper chest: Please see concurrent CT chest abdomen pelvis for intrathoracic evaluation, including description of small pneumothorax. CT THORACIC SPINE FINDINGS Alignment: No substantial sagittal subluxation. Skull base and vertebrae: Acute nondisplaced fracture involving the anterior and inferior T11 vertebral body with extension to involve the bridging osteophyte and T11-T12 disc space (for example see series 9, image 59; series 8, image 59). Disc levels:  Mild multilevel degenerative change. Paraspinal: Please see concurrent CT chest abdomen pelvis for intrathoracic evaluation. CT LUMBAR SPINE FINDINGS Alignment: Grade 1 anterolisthesis of L3 on L4, likely degenerative given severe facet arthropathy at this level. Skull  base and vertebrae: Acute nondisplaced fracture of the right L1, L2, and L3 transverse processes. Vertebral body heights are maintained. Fall disc levels: Severe facet arthropathy at L3-L4. Paraspinal: Please see concurrent CT chest abdomen pelvis for intra-abdominal evaluation IMPRESSION: CT head: No acute intracranial abnormality. CT cervical, thoracic and lumbar spine: 1. Acute nondisplaced fracture involving the anterior and inferior T11 vertebral body with extension to involve the bridging osteophyte and T11-T12 disc space. 2. Acute nondisplaced fracture of the right L1, L2, and L3 transverse processes. 3. Severe facet arthropathy at L3-L4 with degenerative grade 1 anterolisthesis of L3 on L4. Findings discussed with Dr. Bedelia Person via telephone at 5:23 p.m. Electronically Signed   By: Feliberto Harts M.D.   On: 04/24/2022 17:26   CT CERVICAL SPINE WO CONTRAST  Result Date: 04/24/2022 CLINICAL DATA:  [id number] Pain [id number]; Polytrauma, blunt; Head trauma, moderate-severe EXAM: CT HEAD WITHOUT CONTRAST CT CERVICAL SPINE WITHOUT CONTRAST CT THORACIC SPINE WITHOUT CONTRAST CT LUMBAR SPINE WITHOUT CONTRAST TECHNIQUE: Multidetector CT imaging of the head, cervical and thoracic spine was performed following the standard protocol without intravenous contrast. Multiplanar CT image reconstructions of the cervical, thoracic and lumbar spine were also generated. RADIATION DOSE REDUCTION: This exam was performed according to the departmental dose-optimization program which includes automated exposure control, adjustment of the mA and/or kV according to patient size and/or use of iterative reconstruction technique. COMPARISON:  None Available. FINDINGS: CT HEAD FINDINGS Brain: No evidence of acute infarction,  hemorrhage, hydrocephalus, extra-axial collection or mass lesion/mass effect. Vascular: No hyperdense vessel identified. Skull: No acute fracture. Sinuses/Orbits: Clear sinuses. Postoperative changes of both globes. No  acute orbital findings. Other: No mastoid effusions. CT CERVICAL SPINE FINDINGS Alignment: Normal. Skull base and vertebrae: No evidence of acute fracture. Vertebral body heights are maintained. Soft tissues and spinal canal: No prevertebral fluid or swelling. No visible canal hematoma. Disc levels:  Multilevel degenerative change. Upper chest: Please see concurrent CT chest abdomen pelvis for intrathoracic evaluation, including description of small pneumothorax. CT THORACIC SPINE FINDINGS Alignment: No substantial sagittal subluxation. Skull base and vertebrae: Acute nondisplaced fracture involving the anterior and inferior T11 vertebral body with extension to involve the bridging osteophyte and T11-T12 disc space (for example see series 9, image 59; series 8, image 59). Disc levels:  Mild multilevel degenerative change. Paraspinal: Please see concurrent CT chest abdomen pelvis for intrathoracic evaluation. CT LUMBAR SPINE FINDINGS Alignment: Grade 1 anterolisthesis of L3 on L4, likely degenerative given severe facet arthropathy at this level. Skull base and vertebrae: Acute nondisplaced fracture of the right L1, L2, and L3 transverse processes. Vertebral body heights are maintained. Fall disc levels: Severe facet arthropathy at L3-L4. Paraspinal: Please see concurrent CT chest abdomen pelvis for intra-abdominal evaluation IMPRESSION: CT head: No acute intracranial abnormality. CT cervical, thoracic and lumbar spine: 1. Acute nondisplaced fracture involving the anterior and inferior T11 vertebral body with extension to involve the bridging osteophyte and T11-T12 disc space. 2. Acute nondisplaced fracture of the right L1, L2, and L3 transverse processes. 3. Severe facet arthropathy at L3-L4 with degenerative grade 1 anterolisthesis of L3 on L4. Findings discussed with Dr. Bedelia Person via telephone at 5:23 p.m. Electronically Signed   By: Feliberto Harts M.D.   On: 04/24/2022 17:26   CT L-SPINE NO CHARGE  Result  Date: 04/24/2022 CLINICAL DATA:  [id number] Pain 144615; Polytrauma, blunt; Head trauma, moderate-severe EXAM: CT HEAD WITHOUT CONTRAST CT CERVICAL SPINE WITHOUT CONTRAST CT THORACIC SPINE WITHOUT CONTRAST CT LUMBAR SPINE WITHOUT CONTRAST TECHNIQUE: Multidetector CT imaging of the head, cervical and thoracic spine was performed following the standard protocol without intravenous contrast. Multiplanar CT image reconstructions of the cervical, thoracic and lumbar spine were also generated. RADIATION DOSE REDUCTION: This exam was performed according to the departmental dose-optimization program which includes automated exposure control, adjustment of the mA and/or kV according to patient size and/or use of iterative reconstruction technique. COMPARISON:  None Available. FINDINGS: CT HEAD FINDINGS Brain: No evidence of acute infarction, hemorrhage, hydrocephalus, extra-axial collection or mass lesion/mass effect. Vascular: No hyperdense vessel identified. Skull: No acute fracture. Sinuses/Orbits: Clear sinuses. Postoperative changes of both globes. No acute orbital findings. Other: No mastoid effusions. CT CERVICAL SPINE FINDINGS Alignment: Normal. Skull base and vertebrae: No evidence of acute fracture. Vertebral body heights are maintained. Soft tissues and spinal canal: No prevertebral fluid or swelling. No visible canal hematoma. Disc levels:  Multilevel degenerative change. Upper chest: Please see concurrent CT chest abdomen pelvis for intrathoracic evaluation, including description of small pneumothorax. CT THORACIC SPINE FINDINGS Alignment: No substantial sagittal subluxation. Skull base and vertebrae: Acute nondisplaced fracture involving the anterior and inferior T11 vertebral body with extension to involve the bridging osteophyte and T11-T12 disc space (for example see series 9, image 59; series 8, image 59). Disc levels:  Mild multilevel degenerative change. Paraspinal: Please see concurrent CT chest abdomen  pelvis for intrathoracic evaluation. CT LUMBAR SPINE FINDINGS Alignment: Grade 1 anterolisthesis of L3 on L4, likely degenerative given severe facet  arthropathy at this level. Skull base and vertebrae: Acute nondisplaced fracture of the right L1, L2, and L3 transverse processes. Vertebral body heights are maintained. Fall disc levels: Severe facet arthropathy at L3-L4. Paraspinal: Please see concurrent CT chest abdomen pelvis for intra-abdominal evaluation IMPRESSION: CT head: No acute intracranial abnormality. CT cervical, thoracic and lumbar spine: 1. Acute nondisplaced fracture involving the anterior and inferior T11 vertebral body with extension to involve the bridging osteophyte and T11-T12 disc space. 2. Acute nondisplaced fracture of the right L1, L2, and L3 transverse processes. 3. Severe facet arthropathy at L3-L4 with degenerative grade 1 anterolisthesis of L3 on L4. Findings discussed with Dr. Bedelia Person via telephone at 5:23 p.m. Electronically Signed   By: Feliberto Harts M.D.   On: 04/24/2022 17:26   CT T-SPINE NO CHARGE  Result Date: 04/24/2022 CLINICAL DATA:  [id number] Pain 144615; Polytrauma, blunt; Head trauma, moderate-severe EXAM: CT HEAD WITHOUT CONTRAST CT CERVICAL SPINE WITHOUT CONTRAST CT THORACIC SPINE WITHOUT CONTRAST CT LUMBAR SPINE WITHOUT CONTRAST TECHNIQUE: Multidetector CT imaging of the head, cervical and thoracic spine was performed following the standard protocol without intravenous contrast. Multiplanar CT image reconstructions of the cervical, thoracic and lumbar spine were also generated. RADIATION DOSE REDUCTION: This exam was performed according to the departmental dose-optimization program which includes automated exposure control, adjustment of the mA and/or kV according to patient size and/or use of iterative reconstruction technique. COMPARISON:  None Available. FINDINGS: CT HEAD FINDINGS Brain: No evidence of acute infarction, hemorrhage, hydrocephalus, extra-axial  collection or mass lesion/mass effect. Vascular: No hyperdense vessel identified. Skull: No acute fracture. Sinuses/Orbits: Clear sinuses. Postoperative changes of both globes. No acute orbital findings. Other: No mastoid effusions. CT CERVICAL SPINE FINDINGS Alignment: Normal. Skull base and vertebrae: No evidence of acute fracture. Vertebral body heights are maintained. Soft tissues and spinal canal: No prevertebral fluid or swelling. No visible canal hematoma. Disc levels:  Multilevel degenerative change. Upper chest: Please see concurrent CT chest abdomen pelvis for intrathoracic evaluation, including description of small pneumothorax. CT THORACIC SPINE FINDINGS Alignment: No substantial sagittal subluxation. Skull base and vertebrae: Acute nondisplaced fracture involving the anterior and inferior T11 vertebral body with extension to involve the bridging osteophyte and T11-T12 disc space (for example see series 9, image 59; series 8, image 59). Disc levels:  Mild multilevel degenerative change. Paraspinal: Please see concurrent CT chest abdomen pelvis for intrathoracic evaluation. CT LUMBAR SPINE FINDINGS Alignment: Grade 1 anterolisthesis of L3 on L4, likely degenerative given severe facet arthropathy at this level. Skull base and vertebrae: Acute nondisplaced fracture of the right L1, L2, and L3 transverse processes. Vertebral body heights are maintained. Fall disc levels: Severe facet arthropathy at L3-L4. Paraspinal: Please see concurrent CT chest abdomen pelvis for intra-abdominal evaluation IMPRESSION: CT head: No acute intracranial abnormality. CT cervical, thoracic and lumbar spine: 1. Acute nondisplaced fracture involving the anterior and inferior T11 vertebral body with extension to involve the bridging osteophyte and T11-T12 disc space. 2. Acute nondisplaced fracture of the right L1, L2, and L3 transverse processes. 3. Severe facet arthropathy at L3-L4 with degenerative grade 1 anterolisthesis of L3  on L4. Findings discussed with Dr. Bedelia Person via telephone at 5:23 p.m. Electronically Signed   By: Feliberto Harts M.D.   On: 04/24/2022 17:26   CT CHEST ABDOMEN PELVIS W CONTRAST  Result Date: 04/24/2022 CLINICAL DATA:  Blunt trauma. EXAM: CT CHEST, ABDOMEN, AND PELVIS WITH CONTRAST TECHNIQUE: Multidetector CT imaging of the chest, abdomen and pelvis was performed following the standard  protocol during bolus administration of intravenous contrast. RADIATION DOSE REDUCTION: This exam was performed according to the departmental dose-optimization program which includes automated exposure control, adjustment of the mA and/or kV according to patient size and/or use of iterative reconstruction technique. CONTRAST:  75 mL Omnipaque 350 intravenously. COMPARISON:  February 23, 2017.  November 10, 2016. FINDINGS: CT CHEST FINDINGS Cardiovascular: Status post coronary bypass graft. Atherosclerosis of thoracic aorta is noted without aneurysm or dissection. Mild cardiomegaly. No pericardial effusion. Mediastinum/Nodes: No enlarged mediastinal, hilar, or axillary lymph nodes. Thyroid gland, trachea, and esophagus demonstrate no significant findings. Lungs/Pleura: Small right anterior pneumothorax is noted. Minimal posterior basilar subsegmental atelectasis is noted bilaterally. No significant effusion is noted. Stable 6 mm right lower lobe nodule is noted which can be considered benign at this point. Musculoskeletal: Moderately to severely displaced fractures are seen involving the posterior portions of the right eleventh and tenth ribs. Overlying subcutaneous soft tissue gas and contusion is noted. Probable minimally displaced fracture involving the anterior and inferior aspect of T11 vertebral body. CT ABDOMEN PELVIS FINDINGS Hepatobiliary: Probable hepatic steatosis. Status post cholecystectomy. No biliary dilatation is noted. Pancreas: Unremarkable. No pancreatic ductal dilatation or surrounding inflammatory changes.  Spleen: Normal in size without focal abnormality. Adrenals/Urinary Tract: Adrenal glands are unremarkable. Kidneys are normal, without renal calculi, focal lesion, or hydronephrosis. Bladder is unremarkable. Stomach/Bowel: Stomach is within normal limits. Appendix appears normal. No evidence of bowel wall thickening, distention, or inflammatory changes. Vascular/Lymphatic: Aortic atherosclerosis. No enlarged abdominal or pelvic lymph nodes. Reproductive: Uterus and bilateral adnexa are unremarkable. Other: No abdominal wall hernia or abnormality. No abdominopelvic ascites. Musculoskeletal: No acute or significant osseous findings. IMPRESSION: Small right-sided anterior pneumothorax is noted. Critical Value/emergent results were called by telephone at the time of interpretation on 04/24/2022 at 5:15 pm to provider Dr. Bedelia Person, who verbally acknowledged these results. Moderately to severely displaced fractures are seen involving the posterior portions of the right eleventh and twelfth ribs. Large amount of overlying subcutaneous gas and contusion is seen. Probable minimally displaced fracture involving anterior and inferior aspect of T11 vertebral body. Please see dedicated thoracic spine CT for more details. Probable hepatic steatosis. No other traumatic injury seen in the chest, abdomen or pelvis. Aortic Atherosclerosis (ICD10-I70.0). Electronically Signed   By: Lupita Raider M.D.   On: 04/24/2022 17:20   DG Pelvis Portable  Result Date: 04/24/2022 CLINICAL DATA:  Trauma EXAM: PORTABLE PELVIS 1 VIEWS COMPARISON:  None Available. FINDINGS: There is no evidence of pelvic fracture or diastasis. No pelvic bone lesions are seen. IMPRESSION: No acute fracture or dislocation. Electronically Signed   By: Agustin Cree M.D.   On: 04/24/2022 16:55   DG Chest Port 1 View  Result Date: 04/24/2022 CLINICAL DATA:  Trauma. EXAM: PORTABLE CHEST 1 VIEW COMPARISON:  February 22, 2017. FINDINGS: Stable cardiomediastinal  silhouette. Sternotomy wires are noted. Both lungs are clear. The visualized skeletal structures are unremarkable. IMPRESSION: No active disease. Electronically Signed   By: Lupita Raider M.D.   On: 04/24/2022 16:54    Anti-infectives: Anti-infectives (From admission, onward)    None        Assessment/Plan  77 y/o F s/p fall from 8-10 feet without LOC T11 vertebral body FX - NS c/s, Dr. Yetta Barre. Non-op mgmt. Plan upright films once she is up.  L1-3 TP FX  Small right anterior PTX - CXR this morning with tiny right apical PTX and atelectasis, encourage IS. Oxygenating on room air. R Posterior Rib FX  11-12 - multimodal pain control  L hand abrasion - no underlying FX, chronic OA  ABL anemia - hgb 9.5 from 11.4, monitor. Plts 205 Diabetes mellitus - SSI CAD, PAD - cardiologist Dr. Elease Hashimoto, hold DAPT GERD   FEN - Reg, NS 50 ml/hrl; BMP pending VTE - SCD's, hold ASA and plavix  ID - given ancef and tdap in ED 4/12 Admit - trauma service  PT/OT Home imdur, zoloft, xanex, and thhyroid medication re-ordered. Hold ACEI until kidney function labs return   LOS: 0 days   I reviewed nursing notes, ED provider notes, Consultant NS notes, last 24 h vitals and pain scores, last 48 h intake and output, last 24 h labs and trends, and last 24 h imaging results.  This care required moderate level of medical decision making.   Hosie Spangle, PA-C [facility] Please see Amion for pager number during day hours 7:00am-4:30pm

## 2022-04-25 NOTE — Progress Notes (Signed)
Patient ID: Taylor [name], female   DOB: Dec 12, 1945, 77 y.o.   MRN: [id number] AP and lateral thoracolumbar x-rays reviewed in the brace.  These are upright.  The fracture is not visualized.  There is no distraction or compression or loss of vertebral body height or retropulsion or subluxation.  I believe this is a stable fracture that should heal in a brace.  We will sign off.  Please for any further questions.  We will see her in the office for repeat x-rays 2 weeks after discharge please

## 2022-04-25 NOTE — Progress Notes (Signed)
PT Cancellation Note  Patient Details Name: Taylor [name] MRN: [id number] DOB: [birth year]   Cancelled Treatment:    Reason Eval/Treat Not Completed: Other (comment)  Spoke with ortho tech; plans to deliver TLSO around 1pm.  Will check back for formal PT evaluation at that time.   TLSO to be worn when upright and OOB.   Berton Mount 04/25/2022, 11:31 AM

## 2022-04-25 NOTE — Evaluation (Signed)
Physical Therapy Evaluation Patient Details Name: Taylor [name] MRN: [id number] DOB: 1945/07/08 Today's Date: 04/25/2022  History of Present Illness  Pt is 77 year old female admitted 4/12 after falling through a deck. She has suffered right-sided rib fxs and fx at T11, L1-3 TP FX and a soft tissue injury to the right flank. Pt has history of L breast cancer, hypertension, PAD, Pulmonary emobolism, and DMII.  Clinical Impression  Pt admitted with above diagnosis. Previously independent and active. Currently requires min assist for transfers and close guard to ambulate due to LE unsteadiness, with some evidence of buckling. Manual testing 5/5 with knee extension/flexion and distal strength intact but functionally LEs a little unpredictable in standing. Requires RW for support. Impaired imbalance without AD making for increased fall risk. Pt states husband healthy and able to physical assist pt at home if needed, available 24/7.  Tentatively appropriate for HHPT but will follow to ensure progression. Pt currently with functional limitations due to the deficits listed below (see PT Problem List). Pt will benefit from acute skilled PT to increase their independence and safety with mobility to allow discharge.          Recommendations for follow up therapy are one component of a multi-disciplinary discharge planning process, led by the attending physician.  Recommendations may be updated based on patient status, additional functional criteria and insurance authorization.     Assistance Recommended at Discharge Frequent or constant Supervision/Assistance  Patient can return home with the following  A little help with walking and/or transfers;A little help with bathing/dressing/bathroom;Assistance with cooking/housework;Assist for transportation;Help with stairs or ramp for entrance    Equipment Recommendations Other (comment) (gave different answers to PT and OT as to what equipment she has. Will need  RW if cannot confirm that she currently has one.)  Recommendations for Other Services       Functional Status Assessment Patient has had a recent decline in their functional status and demonstrates the ability to make significant improvements in function in a reasonable and predictable amount of time.     Precautions / Restrictions Precautions Precautions: Fall;Back Precaution Booklet Issued: No Required Braces or Orthoses: Spinal Brace Spinal Brace: Thoracolumbosacral orthotic;Applied in sitting position Restrictions Weight Bearing Restrictions: No      Mobility  Bed Mobility Overal bed mobility: Needs Assistance             General bed mobility comments: In recliner. Verbally reviewed log roll for review    Transfers Overall transfer level: Needs assistance Equipment used: Rolling walker (2 wheels) Transfers: Sit to/from Stand Sit to Stand: Min assist           General transfer comment: Min assist for boost to stand. slow and painful transition but grossly able to perform safely while wearing TLSO. RW for support upon standing. Good control with descent into chair.    Ambulation/Gait Ambulation/Gait assistance: Min guard Gait Distance (Feet): 60 Feet Assistive device: Rolling walker (2 wheels) Gait Pattern/deviations: Step-through pattern, Decreased stride length, Knees buckling, Drifts right/left Gait velocity: decr Gait velocity interpretation: <1.31 ft/sec, indicative of household ambulator   General Gait Details: Educated on AD use with RW, cues for technique and control of device. Min guard for safety at all times, showing minor signs of LE buckling (Lt>Rt). Stable with UE support on RW, cues for proximity for safety.  Stairs            Wheelchair Mobility    Modified Rankin (Stroke Patients Only)  Balance Overall balance assessment: Needs assistance Sitting-balance support: No upper extremity supported, Feet supported Sitting  balance-Leahy Scale: Good     Standing balance support: Single extremity supported, During functional activity Standing balance-Leahy Scale: Poor                               Pertinent Vitals/Pain Pain Assessment Pain Assessment: 0-10 Pain Score: 4  Pain Location: R back/side Pain Descriptors / Indicators: Aching, Guarding Pain Intervention(s): Monitored during session, Repositioned    Home Living Family/patient expects to be discharged to:: Private residence Living Arrangements: Spouse/significant other Available Help at Discharge: Family Type of Home: House Home Access: Stairs to enter Entrance Stairs-Rails: Left Entrance Stairs-Number of Steps: 1   Home Layout: One level Home Equipment: Agricultural consultant (2 wheels);Wheelchair - manual;Shower seat Additional Comments: Pt lives at home with husband, both independent, husband is able to assist as needed, retired.    Prior Function Prior Level of Function : Independent/Modified Independent             Mobility Comments: independent, no AD ADLs Comments: independent     Hand Dominance        Extremity/Trunk Assessment   Upper Extremity Assessment Upper Extremity Assessment: Defer to OT evaluation RUE Deficits / Details: pain with shoulder ABD/flexion, slight decrease in ROM, still able to complete ADLs as needed RUE: Shoulder pain with ROM LUE Deficits / Details: pain with shoulder ABD/flexion, slight decrease in ROM, still able to complete ADLs as needed LUE: Shoulder pain with ROM    Lower Extremity Assessment Lower Extremity Assessment: Generalized weakness (No focal deficits noted. Distal strength 5/5 with hallux ext, ankle DF/PF, knee flexion/ext.)       Communication   Communication: No difficulties  Cognition Arousal/Alertness: Awake/alert Behavior During Therapy: WFL for tasks assessed/performed Overall Cognitive Status: Within Functional Limits for tasks assessed                                           General Comments General comments (skin integrity, edema, etc.): Reviewed precautions    Exercises General Exercises - Lower Extremity Ankle Circles/Pumps: AROM, Both, 10 reps, Seated   Assessment/Plan    PT Assessment Patient needs continued PT services  PT Problem List Decreased strength;Decreased range of motion;Decreased activity tolerance;Decreased balance;Decreased mobility;Decreased knowledge of use of DME;Decreased coordination;Decreased knowledge of precautions;Pain       PT Treatment Interventions DME instruction;Gait training;Stair training;Functional mobility training;Therapeutic activities;Therapeutic exercise;Balance training;Neuromuscular re-education;Patient/family education;Modalities    PT Goals (Current goals can be found in the Care Plan section)  Acute Rehab PT Goals Patient Stated Goal: get well PT Goal Formulation: With patient Time For Goal Achievement: 05/09/22 Potential to Achieve Goals: Good    Frequency Min 4X/week     Co-evaluation               AM-PAC PT "6 Clicks" Mobility  Outcome Measure Help needed turning from your back to your side while in a flat bed without using bedrails?: A Little Help needed moving from lying on your back to sitting on the side of a flat bed without using bedrails?: A Little Help needed moving to and from a bed to a chair (including a wheelchair)?: A Little Help needed standing up from a chair using your arms (e.g., wheelchair or bedside chair)?: A Little Help  needed to walk in hospital room?: A Little Help needed climbing 3-5 steps with a railing? : A Lot 6 Click Score: 17    End of Session Equipment Utilized During Treatment: Gait belt;Back brace Activity Tolerance: Patient tolerated treatment well Patient left: in chair;with call bell/phone within reach;with chair alarm set Nurse Communication: Mobility status PT Visit Diagnosis: Unsteadiness on feet (R26.81);Other  abnormalities of gait and mobility (R26.89);Muscle weakness (generalized) (M62.81);Difficulty in walking, not elsewhere classified (R26.2);Pain Pain - Right/Left:  (back)    Time: 0223-3612 PT Time Calculation (min) (ACUTE ONLY): 20 min   Charges:   PT Evaluation $PT Eval Low Complexity: 1 Low          Kathlyn Sacramento, PT, DPT Physical Therapist Acute Rehabilitation Services [facility]   Berton Mount 04/25/2022, 3:59 PM

## 2022-04-25 NOTE — Progress Notes (Signed)
Orthopedic Tech Progress Note Patient Details:  Taylor [name] May 10, 1945 [id number]  Ortho Devices Type of Ortho Device: Thoracolumbar corset (TLSO) Ortho Device/Splint Location: adjusted to pt, at bedside for use when OOB with PT today Ortho Device/Splint Interventions: Ordered, Application, Adjustment, Removal   Post Interventions Patient Tolerated: Well Instructions Provided: Care of device, Adjustment of device  [name] Carmine Savoy 04/25/2022, 7:29 PM

## 2022-04-25 NOTE — Consult Note (Signed)
Reason for Consult: T11 fracture Referring Physician: Trauma service  OBERA STAUCH is an 77 y.o. female.   HPI:  77 year old female who was walking on her deck and fell through the boards.  She has suffered right-sided rib injuries at T11 and T12 and a soft tissue injury to the right flank.  She complains of right-sided pain associated with this.  CT scan of the chest showed a minor fracture through the anterior part of T11-12 and neurosurgical evaluation was requested  Past Medical History:  Diagnosis Date   Cancer of left breast    Complication of anesthesia    Coronary artery disease    a. 2006 s/p CABG x 4 (LIMA->LAD, VG->Diag->OM, VG->RPDA);  b. 02/2015: [place] s/p DES to [facility], VG->PDA & VG->D1->OM 100; c. 06/2015 Cath: diffuse dzs->Med rx; d. 11/2015 Cath/PCI: LM nl, LAD 100ost, 95d, D1 90ost, LCX 35ost, 65p, OM2 40, lat OM2 90, RCA 95p (2.75x16 Synergy DES), patent mid stent, RPLB 70, LIMA->LAD ok.EF55-65%.   Depression    Hx of tobacco use, presenting hazards to health    a. quit 2006.   Hypercholesteremia    Hypertension    Hypertensive heart disease    Echo 04/2019: Inferior HK, EF 55, normal RV SF, RVSP normal at 29.2, mild BAE, trivial MR, mild aortic valve sclerosis without stenosis   Left carotid bruit    a. 09/2014 Carotid U/S: 1-39% bilat ICA stenosis.   PAD (peripheral artery disease)    a. 11/2014 ABI: R: 0.63, L 0.59;  b. 11/2014 Periph Angio: bilat pop occlusions. L - short w/ reconstituion via collats in dist pop w/ 2 vessel runoff, R long w/ reconstitution in prox tib/peroneal arteries-->med Rx w/ pletal.   Pulmonary embolism a. 2014.   Reflux esophagitis    Type II diabetes mellitus     Past Surgical History:  Procedure Laterality Date   BREAST BIOPSY Left ~ 2012   BUNIONECTOMY Left 1970s   CARDIAC CATHETERIZATION  04/2004   CARDIAC CATHETERIZATION N/A 02/21/2015   Procedure: Left Heart Cath and Cors/Grafts Angiography;  Surgeon: Lyn Records, MD;  Location: [facility]  INVASIVE CV LAB;  Service: Cardiovascular;  Laterality: N/A;   CARDIAC CATHETERIZATION N/A 02/21/2015   Procedure: Coronary Stent Intervention;  Surgeon: Lyn Records, MD;  Location: [facility] INVASIVE CV LAB;  Service: Cardiovascular;  Laterality: N/A;   CARDIAC CATHETERIZATION N/A 02/21/2015   Procedure: Intravascular Pressure Wire/FFR Study;  Surgeon: Lyn Records, MD;  Location: [facility] INVASIVE CV LAB;  Service: Cardiovascular;  Laterality: N/A;   CARDIAC CATHETERIZATION N/A 06/20/2015   Procedure: Left Heart Cath and Cors/Grafts Angiography;  Surgeon: Laurey Morale, MD;  Location: [facility] INVASIVE CV LAB;  Service: Cardiovascular;  Laterality: N/A;   CARDIAC CATHETERIZATION N/A 11/19/2015   Procedure: Left Heart Cath and Cors/Grafts Angiography;  Surgeon: Peter M [place]il[place], MD;  Location: [facility] INVASIVE CV LAB;  Service: Cardiovascular;  Laterality: N/A;   CARDIAC CATHETERIZATION N/A 11/19/2015   Procedure: Coronary Stent Intervention;  Surgeon: Peter M [place], MD;  Location: [facility] INVASIVE CV LAB;  Service: Cardiovascular;  Laterality: N/A;   CATARACT EXTRACTION W/ INTRAOCULAR LENS  IMPLANT, BILATERAL Bilateral 1980s-1990s   CORONARY ANGIOPLASTY WITH STENT PLACEMENT  02/21/2015   "1 stent"   CORONARY ARTERY BYPASS GRAFT  05/2004   "CABG X4"   EYE SURGERY     LAPAROSCOPIC CHOLECYSTECTOMY  1990s   MASTECTOMY Left ~ 2012   PERIPHERAL VASCULAR CATHETERIZATION N/A 12/12/2014   Procedure: Abdominal Aortogram;  Surgeon:  Iran Ouch, MD;  Location: [facility] INVASIVE CV LAB;  Service: Cardiovascular;  Laterality: N/A;   RETINAL DETACHMENT SURGERY Right 1990s    Allergies  Allergen Reactions   Niacin Other (See Comments)    burning   Duloxetine     Other reaction(s): Other (See Comments)   Amoxicillin Rash   Ibuprofen Rash   Lipitor [Atorvastatin] Other (See Comments)    Muscle pain    Social History   Tobacco Use   Smoking status: Former    Packs/day: 0.50    Years: 15.00    Additional pack years: 0.00    Total  pack years: 7.50    Types: Cigarettes    Quit date: 05/11/2004    Years since quitting: 17.9   Smokeless tobacco: Never  Substance Use Topics   Alcohol use: No    Family History  Problem Relation Age of Onset   Coronary artery disease Mother 12       status post CABG    Heart attack Father 5       deceased secondary to fatal  first myocardial infarction   Coronary artery disease Sister 47       alive had bypass surgery   Cancer Sister    Deep vein thrombosis Cousin        Neice had after knee surgery     Review of Systems  Positive ROS: neg  All other systems have been reviewed and were otherwise negative with the exception of those mentioned in the HPI and as above.  Objective: Vital signs in last 24 hours: Temp:  [97.5 F (36.4 C)-98.2 F (36.8 C)] 97.5 F (36.4 C) (04/13 0740) Pulse Rate:  [58-65] 61 (04/13 0740) Resp:  [15-18] 15 (04/13 0740) BP: (102-138)/(50-66) 108/50 (04/13 0740) SpO2:  [94 %-100 %] 94 % (04/13 0740)  General Appearance: Alert, cooperative, no distress, appears stated age Head: Normocephalic, without obvious abnormality, atraumatic Eyes: PERRL, conjunctiva/corneas clear, EOM's intact   Neck: Supple, symmetrical, trachea midline Lungs: respirations unlabored Heart: Regular rate and rhythm Abdomen: Soft   NEUROLOGIC:   Mental status: A&O x4, no aphasia, good attention span, Memory and fund of knowledge appear to be appropriate Motor Exam - grossly normal, normal tone and bulk Sensory Exam - grossly normal Reflexes: Not tested Coordination - grossly normal Gait -not tested Balance -not tested Cranial Nerves: I: smell Not tested  II: visual acuity  OS: na    OD: na  II: visual fields Full to confrontation  II: pupils Equal, round, reactive to light  III,VII: ptosis None  III,IV,VI: extraocular muscles  Full ROM  V: mastication Normal  V: facial light touch sensation  Normal  V,VII: corneal reflex  Present  VII: facial muscle  function - upper  Normal  VII: facial muscle function - lower Normal  VIII: hearing Not tested  IX: soft palate elevation  Normal  IX,X: gag reflex Present  XI: trapezius strength  5/5  XI: sternocleidomastoid strength 5/5  XI: neck flexion strength  5/5  XII: tongue strength  Normal    Data Review Lab Results  Component Value Date   WBC 6.1 04/25/2022   HGB 9.5 (L) 04/25/2022   HCT 28.4 (L) 04/25/2022   MCV 93.1 04/25/2022   PLT 205 04/25/2022   Lab Results  Component Value Date   NA 138 04/24/2022   K 4.2 04/24/2022   CL 104 04/24/2022   CO2 21 (L) 04/24/2022   BUN 22 04/24/2022   CREATININE  1.40 (H) 04/24/2022   GLUCOSE 211 (H) 04/24/2022   Lab Results  Component Value Date   INR 1.1 04/24/2022   PROTIME 24.0 (H) 08/26/2012    Radiology: DG Chest Port 1 View  Result Date: 04/25/2022 CLINICAL DATA:  Traumatic pneumothorax EXAM: PORTABLE CHEST 1 VIEW COMPARISON:  Yesterday FINDINGS: Small right apical pneumothorax. Low volume chest with hazy density at the bases. Small pleural effusions are possible. Cardiopericardial enlargement. Prior CABG. IMPRESSION: 1. Small right apical pneumothorax, less than 10%. 2. Worsening aeration with atelectasis or infiltrate in the lower lobes. Electronically Signed   By: Tiburcio Pea M.D.   On: 04/25/2022 08:27   DG Hand Complete Left  Result Date: 04/24/2022 CLINICAL DATA:  Fall.  Bilateral hand pain. EXAM: LEFT HAND - COMPLETE 3+ VIEW; RIGHT HAND - COMPLETE 3+ VIEW COMPARISON:  None Available. FINDINGS: Left hand: Severe thumb carpometacarpal joint space narrowing, subchondral sclerosis, and peripheral osteophytosis. Mild triscaphe joint space narrowing. Mild distal radioulnar joint space narrowing and peripheral spurring. 5 mm well corticated chronic ossicle just distal to the ulnar styloid, the sequela of remote trauma. Mild-to-moderate thumb interphalangeal and second greater than third DIP joint space narrowing and peripheral  osteophytosis. Mild fourth and minimal fifth DIP joint space narrowing and peripheral osteophytosis. There is a chronic appearing 3 x 1 mm calcific density just lateral to the base of the proximal phalanx of the third finger. A 2 mm apparent well corticated ossicle is seen at the dorsal aspect of the fourth DIP joint, likely the sequela of remote trauma. Recommend clinical correlation for point tenderness. Right hand: Severe thumb carpometacarpal joint space narrowing, subchondral sclerosis, and peripheral osteophytosis. Moderate triscaphe joint space narrowing and subchondral sclerosis. Mild-to-moderate thumb interphalangeal joint space narrowing and peripheral osteophytosis. A pulse oximeter obscures portions of the distal index finger. There appears to be moderate index finger DIP joint space narrowing and peripheral osteophytosis. Mild-to-moderate third and mild fourth and fifth DIP and mild second and third PIP joint space narrowing and peripheral osteophytosis. No acute fracture or dislocation. IMPRESSION: LEFT HAND: 1. Severe thumb carpometacarpal osteoarthritis. 2. Mild-to-moderate thumb interphalangeal and second greater than third DIP osteoarthritis. 3. A 2 mm apparent well corticated ossicle is seen at the dorsal aspect of the fourth DIP joint, likely the sequela of remote trauma. Recommend clinical correlation for point tenderness. RIGHT HAND: 1. Severe thumb carpometacarpal and moderate triscaphe osteoarthritis. 2. Moderate index finger DIP and mild-to-moderate thumb interphalangeal osteoarthritis. Electronically Signed   By: Neita Garnet M.D.   On: 04/24/2022 18:23   DG Hand Complete Right  Result Date: 04/24/2022 CLINICAL DATA:  Fall.  Bilateral hand pain. EXAM: LEFT HAND - COMPLETE 3+ VIEW; RIGHT HAND - COMPLETE 3+ VIEW COMPARISON:  None Available. FINDINGS: Left hand: Severe thumb carpometacarpal joint space narrowing, subchondral sclerosis, and peripheral osteophytosis. Mild triscaphe joint  space narrowing. Mild distal radioulnar joint space narrowing and peripheral spurring. 5 mm well corticated chronic ossicle just distal to the ulnar styloid, the sequela of remote trauma. Mild-to-moderate thumb interphalangeal and second greater than third DIP joint space narrowing and peripheral osteophytosis. Mild fourth and minimal fifth DIP joint space narrowing and peripheral osteophytosis. There is a chronic appearing 3 x 1 mm calcific density just lateral to the base of the proximal phalanx of the third finger. A 2 mm apparent well corticated ossicle is seen at the dorsal aspect of the fourth DIP joint, likely the sequela of remote trauma. Recommend clinical correlation for point tenderness. Right hand:  Severe thumb carpometacarpal joint space narrowing, subchondral sclerosis, and peripheral osteophytosis. Moderate triscaphe joint space narrowing and subchondral sclerosis. Mild-to-moderate thumb interphalangeal joint space narrowing and peripheral osteophytosis. A pulse oximeter obscures portions of the distal index finger. There appears to be moderate index finger DIP joint space narrowing and peripheral osteophytosis. Mild-to-moderate third and mild fourth and fifth DIP and mild second and third PIP joint space narrowing and peripheral osteophytosis. No acute fracture or dislocation. IMPRESSION: LEFT HAND: 1. Severe thumb carpometacarpal osteoarthritis. 2. Mild-to-moderate thumb interphalangeal and second greater than third DIP osteoarthritis. 3. A 2 mm apparent well corticated ossicle is seen at the dorsal aspect of the fourth DIP joint, likely the sequela of remote trauma. Recommend clinical correlation for point tenderness. RIGHT HAND: 1. Severe thumb carpometacarpal and moderate triscaphe osteoarthritis. 2. Moderate index finger DIP and mild-to-moderate thumb interphalangeal osteoarthritis. Electronically Signed   By: Neita Garnet M.D.   On: 04/24/2022 18:23   CT HEAD WO CONTRAST  Result Date:  04/24/2022 CLINICAL DATA:  [id number] Pain [id number]; Polytrauma, blunt; Head trauma, moderate-severe EXAM: CT HEAD WITHOUT CONTRAST CT CERVICAL SPINE WITHOUT CONTRAST CT THORACIC SPINE WITHOUT CONTRAST CT LUMBAR SPINE WITHOUT CONTRAST TECHNIQUE: Multidetector CT imaging of the head, cervical and thoracic spine was performed following the standard protocol without intravenous contrast. Multiplanar CT image reconstructions of the cervical, thoracic and lumbar spine were also generated. RADIATION DOSE REDUCTION: This exam was performed according to the departmental dose-optimization program which includes automated exposure control, adjustment of the mA and/or kV according to patient size and/or use of iterative reconstruction technique. COMPARISON:  None Available. FINDINGS: CT HEAD FINDINGS Brain: No evidence of acute infarction, hemorrhage, hydrocephalus, extra-axial collection or mass lesion/mass effect. Vascular: No hyperdense vessel identified. Skull: No acute fracture. Sinuses/Orbits: Clear sinuses. Postoperative changes of both globes. No acute orbital findings. Other: No mastoid effusions. CT CERVICAL SPINE FINDINGS Alignment: Normal. Skull base and vertebrae: No evidence of acute fracture. Vertebral body heights are maintained. Soft tissues and spinal canal: No prevertebral fluid or swelling. No visible canal hematoma. Disc levels:  Multilevel degenerative change. Upper chest: Please see concurrent CT chest abdomen pelvis for intrathoracic evaluation, including description of small pneumothorax. CT THORACIC SPINE FINDINGS Alignment: No substantial sagittal subluxation. Skull base and vertebrae: Acute nondisplaced fracture involving the anterior and inferior T11 vertebral body with extension to involve the bridging osteophyte and T11-T12 disc space (for example see series 9, image 59; series 8, image 59). Disc levels:  Mild multilevel degenerative change. Paraspinal: Please see concurrent CT chest abdomen pelvis for  intrathoracic evaluation. CT LUMBAR SPINE FINDINGS Alignment: Grade 1 anterolisthesis of L3 on L4, likely degenerative given severe facet arthropathy at this level. Skull base and vertebrae: Acute nondisplaced fracture of the right L1, L2, and L3 transverse processes. Vertebral body heights are maintained. Fall disc levels: Severe facet arthropathy at L3-L4. Paraspinal: Please see concurrent CT chest abdomen pelvis for intra-abdominal evaluation IMPRESSION: CT head: No acute intracranial abnormality. CT cervical, thoracic and lumbar spine: 1. Acute nondisplaced fracture involving the anterior and inferior T11 vertebral body with extension to involve the bridging osteophyte and T11-T12 disc space. 2. Acute nondisplaced fracture of the right L1, L2, and L3 transverse processes. 3. Severe facet arthropathy at L3-L4 with degenerative grade 1 anterolisthesis of L3 on L4. Findings discussed with Dr. Bedelia Person via telephone at 5:23 p.m. Electronically Signed   By: Feliberto Harts M.D.   On: 04/24/2022 17:26   CT CERVICAL SPINE WO CONTRAST  Result Date: 04/24/2022  CLINICAL DATA:  [id number] Pain [id number]; Polytrauma, blunt; Head trauma, moderate-severe EXAM: CT HEAD WITHOUT CONTRAST CT CERVICAL SPINE WITHOUT CONTRAST CT THORACIC SPINE WITHOUT CONTRAST CT LUMBAR SPINE WITHOUT CONTRAST TECHNIQUE: Multidetector CT imaging of the head, cervical and thoracic spine was performed following the standard protocol without intravenous contrast. Multiplanar CT image reconstructions of the cervical, thoracic and lumbar spine were also generated. RADIATION DOSE REDUCTION: This exam was performed according to the departmental dose-optimization program which includes automated exposure control, adjustment of the mA and/or kV according to patient size and/or use of iterative reconstruction technique. COMPARISON:  None Available. FINDINGS: CT HEAD FINDINGS Brain: No evidence of acute infarction, hemorrhage, hydrocephalus, extra-axial collection  or mass lesion/mass effect. Vascular: No hyperdense vessel identified. Skull: No acute fracture. Sinuses/Orbits: Clear sinuses. Postoperative changes of both globes. No acute orbital findings. Other: No mastoid effusions. CT CERVICAL SPINE FINDINGS Alignment: Normal. Skull base and vertebrae: No evidence of acute fracture. Vertebral body heights are maintained. Soft tissues and spinal canal: No prevertebral fluid or swelling. No visible canal hematoma. Disc levels:  Multilevel degenerative change. Upper chest: Please see concurrent CT chest abdomen pelvis for intrathoracic evaluation, including description of small pneumothorax. CT THORACIC SPINE FINDINGS Alignment: No substantial sagittal subluxation. Skull base and vertebrae: Acute nondisplaced fracture involving the anterior and inferior T11 vertebral body with extension to involve the bridging osteophyte and T11-T12 disc space (for example see series 9, image 59; series 8, image 59). Disc levels:  Mild multilevel degenerative change. Paraspinal: Please see concurrent CT chest abdomen pelvis for intrathoracic evaluation. CT LUMBAR SPINE FINDINGS Alignment: Grade 1 anterolisthesis of L3 on L4, likely degenerative given severe facet arthropathy at this level. Skull base and vertebrae: Acute nondisplaced fracture of the right L1, L2, and L3 transverse processes. Vertebral body heights are maintained. Fall disc levels: Severe facet arthropathy at L3-L4. Paraspinal: Please see concurrent CT chest abdomen pelvis for intra-abdominal evaluation IMPRESSION: CT head: No acute intracranial abnormality. CT cervical, thoracic and lumbar spine: 1. Acute nondisplaced fracture involving the anterior and inferior T11 vertebral body with extension to involve the bridging osteophyte and T11-T12 disc space. 2. Acute nondisplaced fracture of the right L1, L2, and L3 transverse processes. 3. Severe facet arthropathy at L3-L4 with degenerative grade 1 anterolisthesis of L3 on L4.  Findings discussed with Dr. Bedelia Person via telephone at 5:23 p.m. Electronically Signed   By: Feliberto Harts M.D.   On: 04/24/2022 17:26   CT L-SPINE NO CHARGE  Result Date: 04/24/2022 CLINICAL DATA:  [id number] Pain [id number]; Polytrauma, blunt; Head trauma, moderate-severe EXAM: CT HEAD WITHOUT CONTRAST CT CERVICAL SPINE WITHOUT CONTRAST CT THORACIC SPINE WITHOUT CONTRAST CT LUMBAR SPINE WITHOUT CONTRAST TECHNIQUE: Multidetector CT imaging of the head, cervical and thoracic spine was performed following the standard protocol without intravenous contrast. Multiplanar CT image reconstructions of the cervical, thoracic and lumbar spine were also generated. RADIATION DOSE REDUCTION: This exam was performed according to the departmental dose-optimization program which includes automated exposure control, adjustment of the mA and/or kV according to patient size and/or use of iterative reconstruction technique. COMPARISON:  None Available. FINDINGS: CT HEAD FINDINGS Brain: No evidence of acute infarction, hemorrhage, hydrocephalus, extra-axial collection or mass lesion/mass effect. Vascular: No hyperdense vessel identified. Skull: No acute fracture. Sinuses/Orbits: Clear sinuses. Postoperative changes of both globes. No acute orbital findings. Other: No mastoid effusions. CT CERVICAL SPINE FINDINGS Alignment: Normal. Skull base and vertebrae: No evidence of acute fracture. Vertebral body heights are maintained. Soft tissues and spinal canal: No  prevertebral fluid or swelling. No visible canal hematoma. Disc levels:  Multilevel degenerative change. Upper chest: Please see concurrent CT chest abdomen pelvis for intrathoracic evaluation, including description of small pneumothorax. CT THORACIC SPINE FINDINGS Alignment: No substantial sagittal subluxation. Skull base and vertebrae: Acute nondisplaced fracture involving the anterior and inferior T11 vertebral body with extension to involve the bridging osteophyte and T11-T12 disc  space (for example see series 9, image 59; series 8, image 59). Disc levels:  Mild multilevel degenerative change. Paraspinal: Please see concurrent CT chest abdomen pelvis for intrathoracic evaluation. CT LUMBAR SPINE FINDINGS Alignment: Grade 1 anterolisthesis of L3 on L4, likely degenerative given severe facet arthropathy at this level. Skull base and vertebrae: Acute nondisplaced fracture of the right L1, L2, and L3 transverse processes. Vertebral body heights are maintained. Fall disc levels: Severe facet arthropathy at L3-L4. Paraspinal: Please see concurrent CT chest abdomen pelvis for intra-abdominal evaluation IMPRESSION: CT head: No acute intracranial abnormality. CT cervical, thoracic and lumbar spine: 1. Acute nondisplaced fracture involving the anterior and inferior T11 vertebral body with extension to involve the bridging osteophyte and T11-T12 disc space. 2. Acute nondisplaced fracture of the right L1, L2, and L3 transverse processes. 3. Severe facet arthropathy at L3-L4 with degenerative grade 1 anterolisthesis of L3 on L4. Findings discussed with Dr. Bedelia Person via telephone at 5:23 p.m. Electronically Signed   By: Feliberto Harts M.D.   On: 04/24/2022 17:26   CT T-SPINE NO CHARGE  Result Date: 04/24/2022 CLINICAL DATA:  [id number] Pain [id number]; Polytrauma, blunt; Head trauma, moderate-severe EXAM: CT HEAD WITHOUT CONTRAST CT CERVICAL SPINE WITHOUT CONTRAST CT THORACIC SPINE WITHOUT CONTRAST CT LUMBAR SPINE WITHOUT CONTRAST TECHNIQUE: Multidetector CT imaging of the head, cervical and thoracic spine was performed following the standard protocol without intravenous contrast. Multiplanar CT image reconstructions of the cervical, thoracic and lumbar spine were also generated. RADIATION DOSE REDUCTION: This exam was performed according to the departmental dose-optimization program which includes automated exposure control, adjustment of the mA and/or kV according to patient size and/or use of iterative  reconstruction technique. COMPARISON:  None Available. FINDINGS: CT HEAD FINDINGS Brain: No evidence of acute infarction, hemorrhage, hydrocephalus, extra-axial collection or mass lesion/mass effect. Vascular: No hyperdense vessel identified. Skull: No acute fracture. Sinuses/Orbits: Clear sinuses. Postoperative changes of both globes. No acute orbital findings. Other: No mastoid effusions. CT CERVICAL SPINE FINDINGS Alignment: Normal. Skull base and vertebrae: No evidence of acute fracture. Vertebral body heights are maintained. Soft tissues and spinal canal: No prevertebral fluid or swelling. No visible canal hematoma. Disc levels:  Multilevel degenerative change. Upper chest: Please see concurrent CT chest abdomen pelvis for intrathoracic evaluation, including description of small pneumothorax. CT THORACIC SPINE FINDINGS Alignment: No substantial sagittal subluxation. Skull base and vertebrae: Acute nondisplaced fracture involving the anterior and inferior T11 vertebral body with extension to involve the bridging osteophyte and T11-T12 disc space (for example see series 9, image 59; series 8, image 59). Disc levels:  Mild multilevel degenerative change. Paraspinal: Please see concurrent CT chest abdomen pelvis for intrathoracic evaluation. CT LUMBAR SPINE FINDINGS Alignment: Grade 1 anterolisthesis of L3 on L4, likely degenerative given severe facet arthropathy at this level. Skull base and vertebrae: Acute nondisplaced fracture of the right L1, L2, and L3 transverse processes. Vertebral body heights are maintained. Fall disc levels: Severe facet arthropathy at L3-L4. Paraspinal: Please see concurrent CT chest abdomen pelvis for intra-abdominal evaluation IMPRESSION: CT head: No acute intracranial abnormality. CT cervical, thoracic and lumbar spine: 1. Acute nondisplaced  fracture involving the anterior and inferior T11 vertebral body with extension to involve the bridging osteophyte and T11-T12 disc space. 2.  Acute nondisplaced fracture of the right L1, L2, and L3 transverse processes. 3. Severe facet arthropathy at L3-L4 with degenerative grade 1 anterolisthesis of L3 on L4. Findings discussed with Dr. Bedelia Person via telephone at 5:23 p.m. Electronically Signed   By: Feliberto Harts M.D.   On: 04/24/2022 17:26   CT CHEST ABDOMEN PELVIS W CONTRAST  Result Date: 04/24/2022 CLINICAL DATA:  Blunt trauma. EXAM: CT CHEST, ABDOMEN, AND PELVIS WITH CONTRAST TECHNIQUE: Multidetector CT imaging of the chest, abdomen and pelvis was performed following the standard protocol during bolus administration of intravenous contrast. RADIATION DOSE REDUCTION: This exam was performed according to the departmental dose-optimization program which includes automated exposure control, adjustment of the mA and/or kV according to patient size and/or use of iterative reconstruction technique. CONTRAST:  75 mL Omnipaque 350 intravenously. COMPARISON:  February 23, 2017.  November 10, 2016. FINDINGS: CT CHEST FINDINGS Cardiovascular: Status post coronary bypass graft. Atherosclerosis of thoracic aorta is noted without aneurysm or dissection. Mild cardiomegaly. No pericardial effusion. Mediastinum/Nodes: No enlarged mediastinal, hilar, or axillary lymph nodes. Thyroid gland, trachea, and esophagus demonstrate no significant findings. Lungs/Pleura: Small right anterior pneumothorax is noted. Minimal posterior basilar subsegmental atelectasis is noted bilaterally. No significant effusion is noted. Stable 6 mm right lower lobe nodule is noted which can be considered benign at this point. Musculoskeletal: Moderately to severely displaced fractures are seen involving the posterior portions of the right eleventh and tenth ribs. Overlying subcutaneous soft tissue gas and contusion is noted. Probable minimally displaced fracture involving the anterior and inferior aspect of T11 vertebral body. CT ABDOMEN PELVIS FINDINGS Hepatobiliary: Probable hepatic  steatosis. Status post cholecystectomy. No biliary dilatation is noted. Pancreas: Unremarkable. No pancreatic ductal dilatation or surrounding inflammatory changes. Spleen: Normal in size without focal abnormality. Adrenals/Urinary Tract: Adrenal glands are unremarkable. Kidneys are normal, without renal calculi, focal lesion, or hydronephrosis. Bladder is unremarkable. Stomach/Bowel: Stomach is within normal limits. Appendix appears normal. No evidence of bowel wall thickening, distention, or inflammatory changes. Vascular/Lymphatic: Aortic atherosclerosis. No enlarged abdominal or pelvic lymph nodes. Reproductive: Uterus and bilateral adnexa are unremarkable. Other: No abdominal wall hernia or abnormality. No abdominopelvic ascites. Musculoskeletal: No acute or significant osseous findings. IMPRESSION: Small right-sided anterior pneumothorax is noted. Critical Value/emergent results were called by telephone at the time of interpretation on 04/24/2022 at 5:15 pm to provider Dr. Bedelia Person, who verbally acknowledged these results. Moderately to severely displaced fractures are seen involving the posterior portions of the right eleventh and twelfth ribs. Large amount of overlying subcutaneous gas and contusion is seen. Probable minimally displaced fracture involving anterior and inferior aspect of T11 vertebral body. Please see dedicated thoracic spine CT for more details. Probable hepatic steatosis. No other traumatic injury seen in the chest, abdomen or pelvis. Aortic Atherosclerosis (ICD10-I70.0). Electronically Signed   By: Lupita Raider M.D.   On: 04/24/2022 17:20   DG Pelvis Portable  Result Date: 04/24/2022 CLINICAL DATA:  Trauma EXAM: PORTABLE PELVIS 1 VIEWS COMPARISON:  None Available. FINDINGS: There is no evidence of pelvic fracture or diastasis. No pelvic bone lesions are seen. IMPRESSION: No acute fracture or dislocation. Electronically Signed   By: Agustin Cree M.D.   On: 04/24/2022 16:55   DG Chest  Port 1 View  Result Date: 04/24/2022 CLINICAL DATA:  Trauma. EXAM: PORTABLE CHEST 1 VIEW COMPARISON:  February 22, 2017. FINDINGS: Stable cardiomediastinal  silhouette. Sternotomy wires are noted. Both lungs are clear. The visualized skeletal structures are unremarkable. IMPRESSION: No active disease. Electronically Signed   By: Lupita Raider M.D.   On: 04/24/2022 16:54      Assessment/Plan: Estimated body mass index is 28 kg/m as calculated from the following:   Height as of 05/12/21: 5\' 7"  (1.702 m).   Weight as of 05/12/21: 3.57 kg.   77 year old female with a minor anterior fracture at T11-12 without vertebral body loss of height or subluxation or kyphosis.  This should be a stable injury and heal in a brace.  TLSO brace when upright and out of bed  Get upright x-rays in the brace   Tia Alert 04/25/2022 9:49 AM

## 2022-04-25 NOTE — Care Management Obs Status (Signed)
MEDICARE OBSERVATION STATUS NOTIFICATION   Patient Details  Name: Taylor [name] MRN: [id number] Date of Birth: 04-07-1945   Medicare Observation Status Notification Given:  Yes    Isaias Cowman, RN 04/25/2022, 3:42 PM

## 2022-04-26 ENCOUNTER — Inpatient Hospital Stay (HOSPITAL_COMMUNITY): Payer: Medicare Other

## 2022-04-26 DIAGNOSIS — J939 Pneumothorax, unspecified: Secondary | ICD-10-CM | POA: Diagnosis not present

## 2022-04-26 LAB — GLUCOSE, CAPILLARY
Glucose-Capillary: 200 mg/dL — ABNORMAL HIGH (ref 70–99)
Glucose-Capillary: 254 mg/dL — ABNORMAL HIGH (ref 70–99)
Glucose-Capillary: 176 mg/dL — ABNORMAL HIGH (ref 70–99)
Glucose-Capillary: 181 mg/dL — ABNORMAL HIGH (ref 70–99)
Glucose-Capillary: 174 mg/dL — ABNORMAL HIGH (ref 70–99)

## 2022-04-26 LAB — BASIC METABOLIC PANEL
Anion gap: 11 (ref 5–15)
BUN: 34 mg/dL — ABNORMAL HIGH (ref 8–23)
CO2: 18 mmol/L — ABNORMAL LOW (ref 22–32)
Calcium: 8.4 mg/dL — ABNORMAL LOW (ref 8.9–10.3)
Chloride: 105 mmol/L (ref 98–111)
Creatinine, Ser: 1.86 mg/dL — ABNORMAL HIGH (ref 0.44–1.00)
GFR, Estimated: 28 mL/min — ABNORMAL LOW (ref >60)
Glucose, Bld: 237 mg/dL — ABNORMAL HIGH (ref 70–99)
Potassium: 4.1 mmol/L (ref 3.5–5.1)
Sodium: 134 mmol/L — ABNORMAL LOW (ref 135–145)

## 2022-04-26 LAB — CBC
HCT: 22.3 % — ABNORMAL LOW (ref 36.0–46.0)
Hemoglobin: 7.5 g/dL — ABNORMAL LOW (ref 12.0–15.0)
MCH: 31.5 pg (ref 26.0–34.0)
MCHC: 33.6 g/dL (ref 30.0–36.0)
MCV: 93.7 fL (ref 80.0–100.0)
Platelets: 159 10*3/uL (ref 150–400)
RBC: 2.38 MIL/uL — ABNORMAL LOW (ref 3.87–5.11)
RDW: 13.7 % (ref 11.5–15.5)
WBC: 6.9 10*3/uL (ref 4.0–10.5)
nRBC: 0 % (ref 0.0–0.2)

## 2022-04-26 LAB — BPAM RBC: Blood Product Expiration Date: 202405102359

## 2022-04-26 LAB — TYPE AND SCREEN

## 2022-04-26 LAB — ABO/RH: ABO/RH(D): O POS

## 2022-04-26 LAB — PREPARE RBC (CROSSMATCH)

## 2022-04-26 MED ORDER — SODIUM CHLORIDE 0.9% IV SOLUTION: Freq: Once | INTRAVENOUS | Status: AC

## 2022-04-26 MED ORDER — OXYCODONE HCL 5 MG PO TABS
5.0000 mg | ORAL_TABLET | ORAL | Status: DC | PRN
  Administered 2022-04-26 – 2022-05-01 (×15): 5 mg via ORAL
  Filled 2022-04-26 (×16): qty 1

## 2022-04-26 MED ORDER — OXYCODONE HCL 5 MG PO TABS: 2.5000 mg | ORAL_TABLET | Freq: Four times a day (QID) | ORAL | Status: DC | PRN

## 2022-04-26 MED ORDER — SODIUM CHLORIDE 0.9 % IV SOLN: INTRAVENOUS | Status: DC

## 2022-04-26 NOTE — Progress Notes (Addendum)
[facility] Progress Note     Subjective: CC:  Right posterior chest wall and back pain limiting deep breathing. Pulling < 250 mL on IS. ORA yesterday, on 2.5 L this AM.  Mobilized with therapy yesterday. Tolerating PO. States she is making a lot of urine.   Objective: Vital signs in last 24 hours: Temp:  [98.3 F (36.8 C)-99.3 F (37.4 C)] 99.1 F (37.3 C) (04/14 0724) Pulse Rate:  [77-86] 86 (04/14 0724) Resp:  [17-20] 20 (04/14 0724) BP: (109-111)/(44-60) 109/44 (04/14 0724) SpO2:  [89 %-94 %] 89 % (04/14 0724) Last BM Date : 04/23/22  Intake/Output from previous day: 04/13 0701 - 04/14 0700 In: 720 [P.O.:720] Out: 900 [Urine:900] Intake/Output this shift: Total I/O In: 240 [P.O.:240] Out: -   PE: Gen:  Alert, appears uncomfortable, pleasant and cooperative Card:  Regular rate and rhythm, pedal pulses 2+ BL Pulm:  Normal effort on 2.5 L [place], poor air movement, decreased lung bases and no wheezing Abd: Soft, non-tender, non-distended Skin: warm and dry, no rashes  hematoma under L axilla is larger and soft. L flank contusion evolving R plank hematoma evolving, firm.  Psych: A&Ox3   Lab Results:  Recent Labs    04/25/22 0131 04/26/22 0121  WBC 6.1 6.9  HGB 9.5* 7.5*  HCT 28.4* 22.3*  PLT 205 159   BMET Recent Labs    04/24/22 1608 04/24/22 1633 04/25/22 0131  NA 137 138 135  K 4.2 4.2 4.7  CL 105 104 103  CO2 21*  --  20*  GLUCOSE 212* 211* 157*  BUN 21 22 26*  CREATININE 1.49* 1.40* 1.80*  CALCIUM 9.6  --  8.7*   PT/INR Recent Labs    04/24/22 1608  LABPROT 14.0  INR 1.1   CMP     Component Value Date/Time   NA 135 04/25/2022 0131   NA 137 07/22/2016 1018   NA 140 01/23/2016 1323   K 4.7 04/25/2022 0131   K 4.2 01/23/2016 1323   CL 103 04/25/2022 0131   CL 107 04/21/2012 0941   CO2 20 (L) 04/25/2022 0131   CO2 26 01/23/2016 1323   GLUCOSE 157 (H) 04/25/2022 0131   GLUCOSE 113 01/23/2016 1323   GLUCOSE 121 (H)  04/21/2012 0941   BUN 26 (H) 04/25/2022 0131   BUN 18 07/22/2016 1018   BUN 19.4 01/23/2016 1323   CREATININE 1.80 (H) 04/25/2022 0131   CREATININE 1.1 01/23/2016 1323   CALCIUM 8.7 (L) 04/25/2022 0131   CALCIUM 10.0 01/23/2016 1323   PROT 7.3 04/24/2022 1608   PROT 7.3 01/27/2016 1034   PROT 7.5 01/23/2016 1323   ALBUMIN 4.0 04/24/2022 1608   ALBUMIN 4.6 01/27/2016 1034   ALBUMIN 4.2 01/23/2016 1323   AST 876 (H) 04/24/2022 1608   AST 21 01/23/2016 1323   ALT 738 (H) 04/24/2022 1608   ALT 15 01/23/2016 1323   ALKPHOS 58 04/24/2022 1608   ALKPHOS 57 01/23/2016 1323   BILITOT 1.0 04/24/2022 1608   BILITOT 0.4 01/27/2016 1034   BILITOT 0.60 01/23/2016 1323   GFRNONAA 29 (L) 04/25/2022 0131   GFRAA 46 (L) 02/22/2017 2032   Lipase     Component Value Date/Time   LIPASE 61 (H) 02/22/2017 2032       Studies/Results: DG CHEST PORT 1 VIEW  Result Date: 04/26/2022 CLINICAL DATA:  Pneumothorax EXAM: PORTABLE CHEST 1 VIEW COMPARISON:  Chest x-ray dated 04/25/2022 and chest CT dated 04/24/2022. FINDINGS: The RIGHT-sided pneumothorax has  increased in size when compared to the most recent chest x-ray of 04/25/2022, now approximately 10% (previously less than 10%) with a current pleural displacement of approximately 13 mm. Bibasilar opacities are not significantly changed. Heart size and mediastinal contours are stable. IMPRESSION: RIGHT-sided pneumothorax has increased in size when compared to yesterday's chest x-ray of 04/25/2022, now approximately 10% or slightly more (less than 10% yesterday) with a current pleural displacement of approximately 13 mm. Consider chest tube placement. At minimum, recommend short-term follow-up chest x-ray later today. These results will be called to the ordering clinician or representative by the Radiologist Assistant, and communication documented in the PACS or [employer]. Electronically Signed   By: Bary Richard M.D.   On: 04/26/2022 09:49   DG  THORACOLUMBAR SPINE  Result Date: 04/25/2022 CLINICAL DATA:  Thoracic spine fracture EXAM: THORACOLUMBAR SPINE 1V COMPARISON:  CT thoracolumbar spine dated 04/24/2022 FINDINGS: On CT, there is a fracture involving the anterior inferior corner of the T11 vertebral body, extending into the T11-12 bridging osteophyte, and into the right posterior elements at T12. However, this is not radiographically visible. Normal thoracic kyphosis. No fracture or dislocation is seen. Bulky anterior osteophytosis. Known medial right rib fractures are better evaluated on CT Additionally, on CT, there are nondisplaced fractures involving the right L1-3 transverse processes. This is also not radiographically visible. Normal lumbar lordosis. No fracture or dislocation is seen. Vascular calcifications. Cholecystectomy clips. IMPRESSION: Known thoracolumbar and medial rib fractures on CT are not radiographically evident, as described above. Electronically Signed   By: Charline Bills M.D.   On: 04/25/2022 21:07   DG Chest Port 1 View  Result Date: 04/25/2022 CLINICAL DATA:  Traumatic pneumothorax EXAM: PORTABLE CHEST 1 VIEW COMPARISON:  Yesterday FINDINGS: Small right apical pneumothorax. Low volume chest with hazy density at the bases. Small pleural effusions are possible. Cardiopericardial enlargement. Prior CABG. IMPRESSION: 1. Small right apical pneumothorax, less than 10%. 2. Worsening aeration with atelectasis or infiltrate in the lower lobes. Electronically Signed   By: Tiburcio Pea M.D.   On: 04/25/2022 08:27   DG Hand Complete Left  Result Date: 04/24/2022 CLINICAL DATA:  Fall.  Bilateral hand pain. EXAM: LEFT HAND - COMPLETE 3+ VIEW; RIGHT HAND - COMPLETE 3+ VIEW COMPARISON:  None Available. FINDINGS: Left hand: Severe thumb carpometacarpal joint space narrowing, subchondral sclerosis, and peripheral osteophytosis. Mild triscaphe joint space narrowing. Mild distal radioulnar joint space narrowing and peripheral  spurring. 5 mm well corticated chronic ossicle just distal to the ulnar styloid, the sequela of remote trauma. Mild-to-moderate thumb interphalangeal and second greater than third DIP joint space narrowing and peripheral osteophytosis. Mild fourth and minimal fifth DIP joint space narrowing and peripheral osteophytosis. There is a chronic appearing 3 x 1 mm calcific density just lateral to the base of the proximal phalanx of the third finger. A 2 mm apparent well corticated ossicle is seen at the dorsal aspect of the fourth DIP joint, likely the sequela of remote trauma. Recommend clinical correlation for point tenderness. Right hand: Severe thumb carpometacarpal joint space narrowing, subchondral sclerosis, and peripheral osteophytosis. Moderate triscaphe joint space narrowing and subchondral sclerosis. Mild-to-moderate thumb interphalangeal joint space narrowing and peripheral osteophytosis. A pulse oximeter obscures portions of the distal index finger. There appears to be moderate index finger DIP joint space narrowing and peripheral osteophytosis. Mild-to-moderate third and mild fourth and fifth DIP and mild second and third PIP joint space narrowing and peripheral osteophytosis. No acute fracture or dislocation. IMPRESSION: LEFT HAND:  1. Severe thumb carpometacarpal osteoarthritis. 2. Mild-to-moderate thumb interphalangeal and second greater than third DIP osteoarthritis. 3. A 2 mm apparent well corticated ossicle is seen at the dorsal aspect of the fourth DIP joint, likely the sequela of remote trauma. Recommend clinical correlation for point tenderness. RIGHT HAND: 1. Severe thumb carpometacarpal and moderate triscaphe osteoarthritis. 2. Moderate index finger DIP and mild-to-moderate thumb interphalangeal osteoarthritis. Electronically Signed   By: Neita Garnet M.D.   On: 04/24/2022 18:23   DG Hand Complete Right  Result Date: 04/24/2022 CLINICAL DATA:  Fall.  Bilateral hand pain. EXAM: LEFT HAND -  COMPLETE 3+ VIEW; RIGHT HAND - COMPLETE 3+ VIEW COMPARISON:  None Available. FINDINGS: Left hand: Severe thumb carpometacarpal joint space narrowing, subchondral sclerosis, and peripheral osteophytosis. Mild triscaphe joint space narrowing. Mild distal radioulnar joint space narrowing and peripheral spurring. 5 mm well corticated chronic ossicle just distal to the ulnar styloid, the sequela of remote trauma. Mild-to-moderate thumb interphalangeal and second greater than third DIP joint space narrowing and peripheral osteophytosis. Mild fourth and minimal fifth DIP joint space narrowing and peripheral osteophytosis. There is a chronic appearing 3 x 1 mm calcific density just lateral to the base of the proximal phalanx of the third finger. A 2 mm apparent well corticated ossicle is seen at the dorsal aspect of the fourth DIP joint, likely the sequela of remote trauma. Recommend clinical correlation for point tenderness. Right hand: Severe thumb carpometacarpal joint space narrowing, subchondral sclerosis, and peripheral osteophytosis. Moderate triscaphe joint space narrowing and subchondral sclerosis. Mild-to-moderate thumb interphalangeal joint space narrowing and peripheral osteophytosis. A pulse oximeter obscures portions of the distal index finger. There appears to be moderate index finger DIP joint space narrowing and peripheral osteophytosis. Mild-to-moderate third and mild fourth and fifth DIP and mild second and third PIP joint space narrowing and peripheral osteophytosis. No acute fracture or dislocation. IMPRESSION: LEFT HAND: 1. Severe thumb carpometacarpal osteoarthritis. 2. Mild-to-moderate thumb interphalangeal and second greater than third DIP osteoarthritis. 3. A 2 mm apparent well corticated ossicle is seen at the dorsal aspect of the fourth DIP joint, likely the sequela of remote trauma. Recommend clinical correlation for point tenderness. RIGHT HAND: 1. Severe thumb carpometacarpal and moderate  triscaphe osteoarthritis. 2. Moderate index finger DIP and mild-to-moderate thumb interphalangeal osteoarthritis. Electronically Signed   By: Neita Garnet M.D.   On: 04/24/2022 18:23   CT HEAD WO CONTRAST  Result Date: 04/24/2022 CLINICAL DATA:  [id number] Pain [id number]; Polytrauma, blunt; Head trauma, moderate-severe EXAM: CT HEAD WITHOUT CONTRAST CT CERVICAL SPINE WITHOUT CONTRAST CT THORACIC SPINE WITHOUT CONTRAST CT LUMBAR SPINE WITHOUT CONTRAST TECHNIQUE: Multidetector CT imaging of the head, cervical and thoracic spine was performed following the standard protocol without intravenous contrast. Multiplanar CT image reconstructions of the cervical, thoracic and lumbar spine were also generated. RADIATION DOSE REDUCTION: This exam was performed according to the departmental dose-optimization program which includes automated exposure control, adjustment of the mA and/or kV according to patient size and/or use of iterative reconstruction technique. COMPARISON:  None Available. FINDINGS: CT HEAD FINDINGS Brain: No evidence of acute infarction, hemorrhage, hydrocephalus, extra-axial collection or mass lesion/mass effect. Vascular: No hyperdense vessel identified. Skull: No acute fracture. Sinuses/Orbits: Clear sinuses. Postoperative changes of both globes. No acute orbital findings. Other: No mastoid effusions. CT CERVICAL SPINE FINDINGS Alignment: Normal. Skull base and vertebrae: No evidence of acute fracture. Vertebral body heights are maintained. Soft tissues and spinal canal: No prevertebral fluid or swelling. No visible canal hematoma. Disc levels:  Multilevel degenerative change. Upper chest: Please see concurrent CT chest abdomen pelvis for intrathoracic evaluation, including description of small pneumothorax. CT THORACIC SPINE FINDINGS Alignment: No substantial sagittal subluxation. Skull base and vertebrae: Acute nondisplaced fracture involving the anterior and inferior T11 vertebral body with extension to  involve the bridging osteophyte and T11-T12 disc space (for example see series 9, image 59; series 8, image 59). Disc levels:  Mild multilevel degenerative change. Paraspinal: Please see concurrent CT chest abdomen pelvis for intrathoracic evaluation. CT LUMBAR SPINE FINDINGS Alignment: Grade 1 anterolisthesis of L3 on L4, likely degenerative given severe facet arthropathy at this level. Skull base and vertebrae: Acute nondisplaced fracture of the right L1, L2, and L3 transverse processes. Vertebral body heights are maintained. Fall disc levels: Severe facet arthropathy at L3-L4. Paraspinal: Please see concurrent CT chest abdomen pelvis for intra-abdominal evaluation IMPRESSION: CT head: No acute intracranial abnormality. CT cervical, thoracic and lumbar spine: 1. Acute nondisplaced fracture involving the anterior and inferior T11 vertebral body with extension to involve the bridging osteophyte and T11-T12 disc space. 2. Acute nondisplaced fracture of the right L1, L2, and L3 transverse processes. 3. Severe facet arthropathy at L3-L4 with degenerative grade 1 anterolisthesis of L3 on L4. Findings discussed with Dr. Bedelia Person via telephone at 5:23 p.m. Electronically Signed   By: Feliberto Harts M.D.   On: 04/24/2022 17:26   CT CERVICAL SPINE WO CONTRAST  Result Date: 04/24/2022 CLINICAL DATA:  [id number] Pain 144615; Polytrauma, blunt; Head trauma, moderate-severe EXAM: CT HEAD WITHOUT CONTRAST CT CERVICAL SPINE WITHOUT CONTRAST CT THORACIC SPINE WITHOUT CONTRAST CT LUMBAR SPINE WITHOUT CONTRAST TECHNIQUE: Multidetector CT imaging of the head, cervical and thoracic spine was performed following the standard protocol without intravenous contrast. Multiplanar CT image reconstructions of the cervical, thoracic and lumbar spine were also generated. RADIATION DOSE REDUCTION: This exam was performed according to the departmental dose-optimization program which includes automated exposure control, adjustment of the mA  and/or kV according to patient size and/or use of iterative reconstruction technique. COMPARISON:  None Available. FINDINGS: CT HEAD FINDINGS Brain: No evidence of acute infarction, hemorrhage, hydrocephalus, extra-axial collection or mass lesion/mass effect. Vascular: No hyperdense vessel identified. Skull: No acute fracture. Sinuses/Orbits: Clear sinuses. Postoperative changes of both globes. No acute orbital findings. Other: No mastoid effusions. CT CERVICAL SPINE FINDINGS Alignment: Normal. Skull base and vertebrae: No evidence of acute fracture. Vertebral body heights are maintained. Soft tissues and spinal canal: No prevertebral fluid or swelling. No visible canal hematoma. Disc levels:  Multilevel degenerative change. Upper chest: Please see concurrent CT chest abdomen pelvis for intrathoracic evaluation, including description of small pneumothorax. CT THORACIC SPINE FINDINGS Alignment: No substantial sagittal subluxation. Skull base and vertebrae: Acute nondisplaced fracture involving the anterior and inferior T11 vertebral body with extension to involve the bridging osteophyte and T11-T12 disc space (for example see series 9, image 59; series 8, image 59). Disc levels:  Mild multilevel degenerative change. Paraspinal: Please see concurrent CT chest abdomen pelvis for intrathoracic evaluation. CT LUMBAR SPINE FINDINGS Alignment: Grade 1 anterolisthesis of L3 on L4, likely degenerative given severe facet arthropathy at this level. Skull base and vertebrae: Acute nondisplaced fracture of the right L1, L2, and L3 transverse processes. Vertebral body heights are maintained. Fall disc levels: Severe facet arthropathy at L3-L4. Paraspinal: Please see concurrent CT chest abdomen pelvis for intra-abdominal evaluation IMPRESSION: CT head: No acute intracranial abnormality. CT cervical, thoracic and lumbar spine: 1. Acute nondisplaced fracture involving the anterior and inferior T11 vertebral body with  extension to  involve the bridging osteophyte and T11-T12 disc space. 2. Acute nondisplaced fracture of the right L1, L2, and L3 transverse processes. 3. Severe facet arthropathy at L3-L4 with degenerative grade 1 anterolisthesis of L3 on L4. Findings discussed with Dr. Bedelia Person via telephone at 5:23 p.m. Electronically Signed   By: Feliberto Harts M.D.   On: 04/24/2022 17:26   CT L-SPINE NO CHARGE  Result Date: 04/24/2022 CLINICAL DATA:  [id number] Pain 144615; Polytrauma, blunt; Head trauma, moderate-severe EXAM: CT HEAD WITHOUT CONTRAST CT CERVICAL SPINE WITHOUT CONTRAST CT THORACIC SPINE WITHOUT CONTRAST CT LUMBAR SPINE WITHOUT CONTRAST TECHNIQUE: Multidetector CT imaging of the head, cervical and thoracic spine was performed following the standard protocol without intravenous contrast. Multiplanar CT image reconstructions of the cervical, thoracic and lumbar spine were also generated. RADIATION DOSE REDUCTION: This exam was performed according to the departmental dose-optimization program which includes automated exposure control, adjustment of the mA and/or kV according to patient size and/or use of iterative reconstruction technique. COMPARISON:  None Available. FINDINGS: CT HEAD FINDINGS Brain: No evidence of acute infarction, hemorrhage, hydrocephalus, extra-axial collection or mass lesion/mass effect. Vascular: No hyperdense vessel identified. Skull: No acute fracture. Sinuses/Orbits: Clear sinuses. Postoperative changes of both globes. No acute orbital findings. Other: No mastoid effusions. CT CERVICAL SPINE FINDINGS Alignment: Normal. Skull base and vertebrae: No evidence of acute fracture. Vertebral body heights are maintained. Soft tissues and spinal canal: No prevertebral fluid or swelling. No visible canal hematoma. Disc levels:  Multilevel degenerative change. Upper chest: Please see concurrent CT chest abdomen pelvis for intrathoracic evaluation, including description of small pneumothorax. CT THORACIC SPINE  FINDINGS Alignment: No substantial sagittal subluxation. Skull base and vertebrae: Acute nondisplaced fracture involving the anterior and inferior T11 vertebral body with extension to involve the bridging osteophyte and T11-T12 disc space (for example see series 9, image 59; series 8, image 59). Disc levels:  Mild multilevel degenerative change. Paraspinal: Please see concurrent CT chest abdomen pelvis for intrathoracic evaluation. CT LUMBAR SPINE FINDINGS Alignment: Grade 1 anterolisthesis of L3 on L4, likely degenerative given severe facet arthropathy at this level. Skull base and vertebrae: Acute nondisplaced fracture of the right L1, L2, and L3 transverse processes. Vertebral body heights are maintained. Fall disc levels: Severe facet arthropathy at L3-L4. Paraspinal: Please see concurrent CT chest abdomen pelvis for intra-abdominal evaluation IMPRESSION: CT head: No acute intracranial abnormality. CT cervical, thoracic and lumbar spine: 1. Acute nondisplaced fracture involving the anterior and inferior T11 vertebral body with extension to involve the bridging osteophyte and T11-T12 disc space. 2. Acute nondisplaced fracture of the right L1, L2, and L3 transverse processes. 3. Severe facet arthropathy at L3-L4 with degenerative grade 1 anterolisthesis of L3 on L4. Findings discussed with Dr. Bedelia Person via telephone at 5:23 p.m. Electronically Signed   By: Feliberto Harts M.D.   On: 04/24/2022 17:26   CT T-SPINE NO CHARGE  Result Date: 04/24/2022 CLINICAL DATA:  [id number] Pain 144615; Polytrauma, blunt; Head trauma, moderate-severe EXAM: CT HEAD WITHOUT CONTRAST CT CERVICAL SPINE WITHOUT CONTRAST CT THORACIC SPINE WITHOUT CONTRAST CT LUMBAR SPINE WITHOUT CONTRAST TECHNIQUE: Multidetector CT imaging of the head, cervical and thoracic spine was performed following the standard protocol without intravenous contrast. Multiplanar CT image reconstructions of the cervical, thoracic and lumbar spine were also generated.  RADIATION DOSE REDUCTION: This exam was performed according to the departmental dose-optimization program which includes automated exposure control, adjustment of the mA and/or kV according to patient size and/or use of iterative reconstruction technique.  COMPARISON:  None Available. FINDINGS: CT HEAD FINDINGS Brain: No evidence of acute infarction, hemorrhage, hydrocephalus, extra-axial collection or mass lesion/mass effect. Vascular: No hyperdense vessel identified. Skull: No acute fracture. Sinuses/Orbits: Clear sinuses. Postoperative changes of both globes. No acute orbital findings. Other: No mastoid effusions. CT CERVICAL SPINE FINDINGS Alignment: Normal. Skull base and vertebrae: No evidence of acute fracture. Vertebral body heights are maintained. Soft tissues and spinal canal: No prevertebral fluid or swelling. No visible canal hematoma. Disc levels:  Multilevel degenerative change. Upper chest: Please see concurrent CT chest abdomen pelvis for intrathoracic evaluation, including description of small pneumothorax. CT THORACIC SPINE FINDINGS Alignment: No substantial sagittal subluxation. Skull base and vertebrae: Acute nondisplaced fracture involving the anterior and inferior T11 vertebral body with extension to involve the bridging osteophyte and T11-T12 disc space (for example see series 9, image 59; series 8, image 59). Disc levels:  Mild multilevel degenerative change. Paraspinal: Please see concurrent CT chest abdomen pelvis for intrathoracic evaluation. CT LUMBAR SPINE FINDINGS Alignment: Grade 1 anterolisthesis of L3 on L4, likely degenerative given severe facet arthropathy at this level. Skull base and vertebrae: Acute nondisplaced fracture of the right L1, L2, and L3 transverse processes. Vertebral body heights are maintained. Fall disc levels: Severe facet arthropathy at L3-L4. Paraspinal: Please see concurrent CT chest abdomen pelvis for intra-abdominal evaluation IMPRESSION: CT head: No acute  intracranial abnormality. CT cervical, thoracic and lumbar spine: 1. Acute nondisplaced fracture involving the anterior and inferior T11 vertebral body with extension to involve the bridging osteophyte and T11-T12 disc space. 2. Acute nondisplaced fracture of the right L1, L2, and L3 transverse processes. 3. Severe facet arthropathy at L3-L4 with degenerative grade 1 anterolisthesis of L3 on L4. Findings discussed with Dr. Bedelia Person via telephone at 5:23 p.m. Electronically Signed   By: Feliberto Harts M.D.   On: 04/24/2022 17:26   CT CHEST ABDOMEN PELVIS W CONTRAST  Result Date: 04/24/2022 CLINICAL DATA:  Blunt trauma. EXAM: CT CHEST, ABDOMEN, AND PELVIS WITH CONTRAST TECHNIQUE: Multidetector CT imaging of the chest, abdomen and pelvis was performed following the standard protocol during bolus administration of intravenous contrast. RADIATION DOSE REDUCTION: This exam was performed according to the departmental dose-optimization program which includes automated exposure control, adjustment of the mA and/or kV according to patient size and/or use of iterative reconstruction technique. CONTRAST:  75 mL Omnipaque 350 intravenously. COMPARISON:  February 23, 2017.  November 10, 2016. FINDINGS: CT CHEST FINDINGS Cardiovascular: Status post coronary bypass graft. Atherosclerosis of thoracic aorta is noted without aneurysm or dissection. Mild cardiomegaly. No pericardial effusion. Mediastinum/Nodes: No enlarged mediastinal, hilar, or axillary lymph nodes. Thyroid gland, trachea, and esophagus demonstrate no significant findings. Lungs/Pleura: Small right anterior pneumothorax is noted. Minimal posterior basilar subsegmental atelectasis is noted bilaterally. No significant effusion is noted. Stable 6 mm right lower lobe nodule is noted which can be considered benign at this point. Musculoskeletal: Moderately to severely displaced fractures are seen involving the posterior portions of the right eleventh and tenth ribs.  Overlying subcutaneous soft tissue gas and contusion is noted. Probable minimally displaced fracture involving the anterior and inferior aspect of T11 vertebral body. CT ABDOMEN PELVIS FINDINGS Hepatobiliary: Probable hepatic steatosis. Status post cholecystectomy. No biliary dilatation is noted. Pancreas: Unremarkable. No pancreatic ductal dilatation or surrounding inflammatory changes. Spleen: Normal in size without focal abnormality. Adrenals/Urinary Tract: Adrenal glands are unremarkable. Kidneys are normal, without renal calculi, focal lesion, or hydronephrosis. Bladder is unremarkable. Stomach/Bowel: Stomach is within normal limits. Appendix appears normal. No  evidence of bowel wall thickening, distention, or inflammatory changes. Vascular/Lymphatic: Aortic atherosclerosis. No enlarged abdominal or pelvic lymph nodes. Reproductive: Uterus and bilateral adnexa are unremarkable. Other: No abdominal wall hernia or abnormality. No abdominopelvic ascites. Musculoskeletal: No acute or significant osseous findings. IMPRESSION: Small right-sided anterior pneumothorax is noted. Critical Value/emergent results were called by telephone at the time of interpretation on 04/24/2022 at 5:15 pm to provider Dr. Bedelia Person, who verbally acknowledged these results. Moderately to severely displaced fractures are seen involving the posterior portions of the right eleventh and twelfth ribs. Large amount of overlying subcutaneous gas and contusion is seen. Probable minimally displaced fracture involving anterior and inferior aspect of T11 vertebral body. Please see dedicated thoracic spine CT for more details. Probable hepatic steatosis. No other traumatic injury seen in the chest, abdomen or pelvis. Aortic Atherosclerosis (ICD10-I70.0). Electronically Signed   By: Lupita Raider M.D.   On: 04/24/2022 17:20   DG Pelvis Portable  Result Date: 04/24/2022 CLINICAL DATA:  Trauma EXAM: PORTABLE PELVIS 1 VIEWS COMPARISON:  None Available.  FINDINGS: There is no evidence of pelvic fracture or diastasis. No pelvic bone lesions are seen. IMPRESSION: No acute fracture or dislocation. Electronically Signed   By: Agustin Cree M.D.   On: 04/24/2022 16:55   DG Chest Port 1 View  Result Date: 04/24/2022 CLINICAL DATA:  Trauma. EXAM: PORTABLE CHEST 1 VIEW COMPARISON:  February 22, 2017. FINDINGS: Stable cardiomediastinal silhouette. Sternotomy wires are noted. Both lungs are clear. The visualized skeletal structures are unremarkable. IMPRESSION: No active disease. Electronically Signed   By: Lupita Raider M.D.   On: 04/24/2022 16:54    Anti-infectives: Anti-infectives (From admission, onward)    None        Assessment/Plan  77 y/o F s/p fall from 8-10 feet without LOC T11 vertebral body FX - NS c/s, Dr. Yetta Barre. Non-op mgmt. Plan upright films with no visible FX. Non-op mgmt with TLSO L1-3 TP FX  Small right anterior PTX - CXR this morning with increase in PTX. Pt has a new O2 requirement but non-labored breathing. May end up needing a chest tube but will monitor for now. CXR in AM.  R Posterior Rib FX 11-12 - multimodal pain control  L hand abrasion - no underlying FX, chronic OA  ABL anemia - hgb 11.4 > 9.5 > 7.5. hold lovenox. HR 80's during my exam. Last BP 109/44, given heart history transfuse 1 u pRBC for goal hgb  > 8.0 AKI - creatinine 1.8 yesterday from 1.4. stop toradol. BMP pending. Diabetes mellitus - SSI CAD, PAD - cardiologist Dr. Elease Hashimoto, hold DAPT GERD   FEN - Reg, SLIV VTE - SCD's, hold ASA and plavix,  stop lovenox due to ABL anemia  ID - given ancef and tdap in ED 4/12 Admit - trauma, 1 u pRBC for ABL anemia, monitor PTX. PT/OT Transfer to progressive care    LOS: 1 day   I reviewed nursing notes, ED provider notes, Consultant NS notes, last 24 h vitals and pain scores, last 48 h intake and output, last 24 h labs and trends, and last 24 h imaging results.  This care required moderate level of medical  decision making.   Hosie Spangle, PA-C [facility] Please see Amion for pager number during day hours 7:00am-4:30pm

## 2022-04-27 ENCOUNTER — Inpatient Hospital Stay (HOSPITAL_COMMUNITY): Payer: Medicare Other

## 2022-04-27 DIAGNOSIS — J9811 Atelectasis: Secondary | ICD-10-CM | POA: Diagnosis not present

## 2022-04-27 DIAGNOSIS — J939 Pneumothorax, unspecified: Secondary | ICD-10-CM | POA: Diagnosis not present

## 2022-04-27 DIAGNOSIS — J90 Pleural effusion, not elsewhere classified: Secondary | ICD-10-CM | POA: Diagnosis not present

## 2022-04-27 LAB — BPAM RBC
ISSUE DATE / TIME: 202404141330
Unit Type and Rh: 5100

## 2022-04-27 LAB — BASIC METABOLIC PANEL
Anion gap: 8 (ref 5–15)
BUN: 33 mg/dL — ABNORMAL HIGH (ref 8–23)
CO2: 19 mmol/L — ABNORMAL LOW (ref 22–32)
Calcium: 7.9 mg/dL — ABNORMAL LOW (ref 8.9–10.3)
Chloride: 106 mmol/L (ref 98–111)
Creatinine, Ser: 1.56 mg/dL — ABNORMAL HIGH (ref 0.44–1.00)
GFR, Estimated: 34 mL/min — ABNORMAL LOW (ref >60)
Glucose, Bld: 181 mg/dL — ABNORMAL HIGH (ref 70–99)
Potassium: 4.2 mmol/L (ref 3.5–5.1)
Sodium: 133 mmol/L — ABNORMAL LOW (ref 135–145)

## 2022-04-27 LAB — CBC
HCT: 25.1 % — ABNORMAL LOW (ref 36.0–46.0)
Hemoglobin: 8.4 g/dL — ABNORMAL LOW (ref 12.0–15.0)
MCH: 30.2 pg (ref 26.0–34.0)
MCHC: 33.5 g/dL (ref 30.0–36.0)
MCV: 90.3 fL (ref 80.0–100.0)
Platelets: 153 10*3/uL (ref 150–400)
RBC: 2.78 MIL/uL — ABNORMAL LOW (ref 3.87–5.11)
RDW: 15.9 % — ABNORMAL HIGH (ref 11.5–15.5)
WBC: 9 10*3/uL (ref 4.0–10.5)
nRBC: 0 % (ref 0.0–0.2)

## 2022-04-27 LAB — GLUCOSE, CAPILLARY
Glucose-Capillary: 133 mg/dL — ABNORMAL HIGH (ref 70–99)
Glucose-Capillary: 196 mg/dL — ABNORMAL HIGH (ref 70–99)
Glucose-Capillary: 136 mg/dL — ABNORMAL HIGH (ref 70–99)
Glucose-Capillary: 155 mg/dL — ABNORMAL HIGH (ref 70–99)

## 2022-04-27 LAB — TYPE AND SCREEN
ABO/RH(D): O POS
Antibody Screen: NEGATIVE
Unit division: 0

## 2022-04-27 MED ORDER — MORPHINE SULFATE (PF) 2 MG/ML IV SOLN: 2.0000 mg | INTRAVENOUS | Status: AC | PRN

## 2022-04-27 MED ORDER — MORPHINE SULFATE (PF) 2 MG/ML IV SOLN
2.0000 mg | Freq: Once | INTRAVENOUS | Status: AC
  Administered 2022-04-27: 2 mg via INTRAVENOUS

## 2022-04-27 NOTE — Progress Notes (Signed)
PT Cancellation Note  Patient Details Name: Taylor [name] MRN: [id number] DOB: [birth year]   Cancelled Treatment:    Reason Eval/Treat Not Completed: (P) Patient at procedure or test/unavailable (Pt having chest tube placed, not available for session. Did not have time to reattempt) Will continue efforts next date per PT plan of care as schedule permits.   Maeci Kalbfleisch M Shalene Gallen 04/27/2022, 5:27 PM

## 2022-04-27 NOTE — Progress Notes (Signed)
Trauma Event Note    Assisted Dr. Bedelia Person and Marisue Ivan, PA with chest tube placement. Pt received Morphine 4mg  IV in 2mg  increments prior to and during procedure. O2 sats dropped briefly to 80s- placed on NRB at 15 for a brief period-   Rapid response RN notified to assist with pt- TRN stayed with pt until RR RN arrived.   1800- Checked on pt  -- had a 7 beat run of VT- Dr. Freida Busman notified -- EKG ordered.  Denies chest pain, does c/o some pain at chest tube site. Primary RN Susmita at bedside-    Last imported Vital Signs BP (!) 108/53 (BP Location: Right Arm)   Pulse 63   Temp 98.4 F (36.9 C) (Oral)   Resp 15   Ht 5' 7.5" (1.715 m)   Wt 176 lb 5.9 oz (80 kg)   SpO2 96%   BMI 27.22 kg/m   Trending CBC Recent Labs    04/25/22 0131 04/26/22 0121 04/27/22 0141  WBC 6.1 6.9 9.0  HGB 9.5* 7.5* 8.4*  HCT 28.4* 22.3* 25.1*  PLT 205 159 153    Trending Coag's No results for input(s): "APTT", "INR" in the last 72 hours.  Trending BMET Recent Labs    04/25/22 0131 04/26/22 1131 04/27/22 0141  NA 135 134* 133*  K 4.7 4.1 4.2  CL 103 105 106  CO2 20* 18* 19*  BUN 26* 34* 33*  CREATININE 1.80* 1.86* 1.56*  GLUCOSE 157* 237* 181*      Benett Swoyer M Soliana Kitko  Trauma Response RN  Please call TRN at [phone] for further assistance.

## 2022-04-27 NOTE — TOC Initial Note (Signed)
Transition of Care [facility]) - Initial/Assessment Note    Patient Details  Name: Taylor [name] MRN: [id number] Date of Birth: [birth year]  Transition of Care [facility]) CM/SW Contact:    Harriet Masson, RN Phone Number: 04/27/2022, 3:57 PM  Clinical Narrative:            Spoke to patient regarding transition needs.  Patient lives with spouse.  Offered home health choice and patient defers to [facility] to find highly rated agency.  Cory with [facility] accepted referral. Has rolator and walker.  Family can transport at discharge.  TOC will continue to follow for needs.         Expected Discharge Plan: Home w Home Health Services Barriers to Discharge: Continued Medical Work up   Patient Goals and CMS Choice Patient states their goals for this hospitalization and ongoing recovery are:: return home with spouse CMS Medicare.gov Compare Post Acute Care list provided to:: Patient Choice offered to / list presented to : Patient      Expected Discharge Plan and Services     Post Acute Care Choice: Home Health Living arrangements for the past 2 months: Single Family Home                                      Prior Living Arrangements/Services Living arrangements for the past 2 months: Single Family Home Lives with:: Spouse Patient language and need for interpreter reviewed:: Yes Do you feel safe going back to the place where you live?: Yes      Need for Family Participation in Patient Care: Yes (Comment) Care giver support system in place?: Yes (comment) Current home services: DME (rolator and walker) Criminal Activity/Legal Involvement Pertinent to Current Situation/Hospitalization: No - Comment as needed  Activities of Daily Living Home Assistive Devices/Equipment: None ADL Screening (condition at time of admission) Patient's cognitive ability adequate to safely complete daily activities?: Yes Is the patient deaf or have difficulty hearing?: No Does the patient have  difficulty seeing, even when wearing glasses/contacts?: No Does the patient have difficulty concentrating, remembering, or making decisions?: No Patient able to express need for assistance with ADLs?: Yes Does the patient have difficulty dressing or bathing?: Yes (NEW) Independently performs ADLs?: No Communication: Independent Dressing (OT): Needs assistance Is this a change from baseline?: Change from baseline, expected to last >3 days Grooming: Needs assistance Is this a change from baseline?: Change from baseline, expected to last >3 days Feeding: Needs assistance Is this a change from baseline?: Change from baseline, expected to last >3 days Bathing: Needs assistance Is this a change from baseline?: Change from baseline, expected to last >3 days Toileting: Needs assistance Is this a change from baseline?: Change from baseline, expected to last >3days In/Out Bed: Needs assistance Is this a change from baseline?: Change from baseline, expected to last >3 days Walks in Home: Needs assistance Is this a change from baseline?: Change from baseline, expected to last >3 days Does the patient have difficulty walking or climbing stairs?: Yes Weakness of Legs: Both Weakness of Arms/Hands: None  Permission Sought/Granted                  Emotional Assessment Appearance:: Appears stated age Attitude/Demeanor/Rapport: Gracious Affect (typically observed): Accepting Orientation: : Oriented to Self, Oriented to Place, Oriented to  Time, Oriented to Situation Alcohol / Substance Use: Not Applicable Psych Involvement: No (comment)  Admission diagnosis:  Rib fractures [S22.49XA] Traumatic pneumothorax, initial encounter [S27.0XXA] Closed fracture of multiple ribs, unspecified laterality, initial encounter [S22.49XA] Compression fracture of body of thoracic vertebra [S22.000A] Patient Active Problem List   Diagnosis Date Noted   Rib fractures 04/24/2022   Pain due to onychomycosis of  toenails of both feet 04/09/2021   Diabetic neuropathy 04/09/2021   Type 2 diabetes mellitus with vascular disease 04/09/2021   Obesity (BMI 30-39.9) 01/16/2018   Mixed hyperlipidemia 09/21/2016   Carotid artery disease 12/03/2015   OSA (obstructive sleep apnea) 12/03/2015   Pain and swelling of left lower leg 11/17/2015   Coronary artery disease    Hypertensive heart disease    Type II diabetes mellitus    PAD (peripheral artery disease) 11/03/2014   Chest pain 08/06/2014   Nausea with vomiting 05/08/2014   Allergic reaction 05/08/2014   Hypercholesteremia    Hypertension    Diabetes mellitus (HCC)    Hx of tobacco use, presenting hazards to health 12/21/2013   Varicose veins of left lower extremity 12/18/2013   Cardiac enzymes elevated 03/01/2012   Chronic diastolic heart failure, NYHA class 1 03/01/2012   Bilateral pulmonary embolism 02/29/2012   History of breast cancer, DCIS, Left mastectomy 03/21/2009. 03/06/2011   Chest pain 11/30/2010   Left carotid bruit 02/26/2009   CHRONIC LEFT LEG VENOUS INSUFFICIENCY 07/02/2008   EDEMA 05/14/2008   HYPERCHOLESTEROLEMIA 05/10/2008   CAD, ARTERY BYPASS GRAFT 05/10/2008   ESOPHAGITIS, REFLUX 05/10/2008   DM (diabetes mellitus) type I uncontrolled, periph vascular disorder (HCC) 10/18/2006   DEPRESSION 10/18/2006   Essential hypertension 10/18/2006   PCP:  [name], MD Pharmacy:   [facility] [address], [place] - [address] [place] [address] Phone: [phone] Fax: [phone]     Social Determinants of Health (SDOH) Social History: SDOH Screenings   Food Insecurity: No Food Insecurity (04/24/2022)  Housing: Low Risk  (04/24/2022)  Transportation Needs: No Transportation Needs (04/24/2022)  Utilities: Not At Risk (04/24/2022)  Tobacco Use: Medium Risk (04/25/2022)   SDOH Interventions:     Readmission Risk Interventions     No data to display

## 2022-04-27 NOTE — Procedures (Signed)
   Procedure Note  Date: 04/27/2022  Procedure: tube thoracostomy--right    Pre-op diagnosis: right pneumothorax  Post-op diagnosis: same  Surgeon: [name], MD  Anesthesia: local   EBL: <5cc procedural; 30cc  SS fluid  evacuated Drains/Implants: 11F chest tube Specimen: none  Description of procedure: Time-out was performed verifying correct patient, procedure, site, laterality, and signature of informed consent. Five cc's of local anesthetic was infiltrated into the tissues just over the fourth intercostal space.  A small skin nick was made at the fourth intercostal space. An introducer needle was inserted and a guidewire inserted through the needle. The needle was removed and the tract dilated. The chest tube was inserted over the guidewire and the guidewire removed.   The tube was secured at the skin with suture and connected to an atrium at -20cm water wall suction. Immediate output from the chest tube was 30cc and was serosanguinous. The site was dressed with xeroform, gauze, and tape. The patient tolerated the procedure well. There were no complications. Follow up chest x-ray was ordered to confirm tube positioning, complete evacuation, and complete lung re-expansion.    [name], MD General and Trauma Surgery [facility]

## 2022-04-27 NOTE — Progress Notes (Signed)
CCMD  notified patient had 7 beats of V tach,vitals checked,patient remains asymptomatic,trauma nurse Clydie Braun notified

## 2022-04-27 NOTE — TOC Initial Note (Signed)
Transition of Care [facility]) - Initial/Assessment Note    Patient Details  Name: Taylor [name] MRN: [id number] Date of Birth: [birth year]  [facility]) CM/SW Contact:    Glennon Mac, RN Phone Number: 04/27/2022, 4:01 PM  Clinical Narrative:                 Pt is 77 year old female who was walking on her deck and fell through the boards. She has suffered right-sided rib fxs and fx at T11 and T12 and a soft tissue injury to the right flank.  PTA, pt independent and living at home with spouse, who is able to provide needed assistance at discharge. PT/OT recommending HH follow up, possible RW if patient does not have one.  Patient had chest tube inserted today; will follow progress.     Expected Discharge Plan: Home w Home Health Services Barriers to Discharge: Continued Medical Work up   Patient Goals and CMS Choice Patient states their goals for this hospitalization and ongoing recovery are:: return home with spouse CMS Medicare.gov Compare Post Acute Care list provided to:: Patient Choice offered to / list presented to : Patient      Expected Discharge Plan and Services     Post Acute Care Choice: Home Health Living arrangements for the past 2 months: Single Family Home                                      Prior Living Arrangements/Services Living arrangements for the past 2 months: Single Family Home Lives with:: Spouse Patient language and need for interpreter reviewed:: Yes Do you feel safe going back to the place where you live?: Yes      Need for Family Participation in Patient Care: Yes (Comment) Care giver support system in place?: Yes (comment) Current home services: DME (rolator and walker) Criminal Activity/Legal Involvement Pertinent to Current Situation/Hospitalization: No - Comment as needed  Activities of Daily Living Home Assistive Devices/Equipment: None ADL Screening (condition at time of admission) Patient's cognitive ability  adequate to safely complete daily activities?: Yes Is the patient deaf or have difficulty hearing?: No Does the patient have difficulty seeing, even when wearing glasses/contacts?: No Does the patient have difficulty concentrating, remembering, or making decisions?: No Patient able to express need for assistance with ADLs?: Yes Does the patient have difficulty dressing or bathing?: Yes (NEW) Independently performs ADLs?: No Communication: Independent Dressing (OT): Needs assistance Is this a change from baseline?: Change from baseline, expected to last >3 days Grooming: Needs assistance Is this a change from baseline?: Change from baseline, expected to last >3 days Feeding: Needs assistance Is this a change from baseline?: Change from baseline, expected to last >3 days Bathing: Needs assistance Is this a change from baseline?: Change from baseline, expected to last >3 days Toileting: Needs assistance Is this a change from baseline?: Change from baseline, expected to last >3days In/Out Bed: Needs assistance Is this a change from baseline?: Change from baseline, expected to last >3 days Walks in Home: Needs assistance Is this a change from baseline?: Change from baseline, expected to last >3 days Does the patient have difficulty walking or climbing stairs?: Yes Weakness of Legs: Both Weakness of Arms/Hands: None                   Emotional Assessment Appearance:: Appears stated age Attitude/Demeanor/Rapport: Gracious Affect (typically observed): Accepting Orientation: :  Oriented to Self, Oriented to Place, Oriented to  Time, Oriented to Situation Alcohol / Substance Use: Not Applicable Psych Involvement: No (comment)  Admission diagnosis:  Rib fractures [S22.49XA] Traumatic pneumothorax, initial encounter [S27.0XXA] Closed fracture of multiple ribs, unspecified laterality, initial encounter [S22.49XA] Compression fracture of body of thoracic vertebra [S22.000A] Patient  Active Problem List   Diagnosis Date Noted   Rib fractures 04/24/2022   Pain due to onychomycosis of toenails of both feet 04/09/2021   Diabetic neuropathy 04/09/2021   Type 2 diabetes mellitus with vascular disease 04/09/2021   Obesity (BMI 30-39.9) 01/16/2018   Mixed hyperlipidemia 09/21/2016   Carotid artery disease 12/03/2015   OSA (obstructive sleep apnea) 12/03/2015   Pain and swelling of left lower leg 11/17/2015   Coronary artery disease    Hypertensive heart disease    Type II diabetes mellitus    PAD (peripheral artery disease) 11/03/2014   Chest pain 08/06/2014   Nausea with vomiting 05/08/2014   Allergic reaction 05/08/2014   Hypercholesteremia    Hypertension    Diabetes mellitus (HCC)    Hx of tobacco use, presenting hazards to health 12/21/2013   Varicose veins of left lower extremity 12/18/2013   Cardiac enzymes elevated 03/01/2012   Chronic diastolic heart failure, NYHA class 1 03/01/2012   Bilateral pulmonary embolism 02/29/2012   History of breast cancer, DCIS, Left mastectomy 03/21/2009. 03/06/2011   Chest pain 11/30/2010   Left carotid bruit 02/26/2009   CHRONIC LEFT LEG VENOUS INSUFFICIENCY 07/02/2008   EDEMA 05/14/2008   HYPERCHOLESTEROLEMIA 05/10/2008   CAD, ARTERY BYPASS GRAFT 05/10/2008   ESOPHAGITIS, REFLUX 05/10/2008   DM (diabetes mellitus) type I uncontrolled, periph vascular disorder (HCC) 10/18/2006   DEPRESSION 10/18/2006   Essential hypertension 10/18/2006   PCP:  [name], MD Pharmacy:   [facility] [address] [place] - [address] [place] [address] Phone: [phone] Fax: [phone]     Social Determinants of Health (SDOH) Social History: SDOH Screenings   Food Insecurity: No Food Insecurity (04/24/2022)  Housing: Low Risk  (04/24/2022)  Transportation Needs: No Transportation Needs (04/24/2022)  Utilities: Not At Risk (04/24/2022)  Tobacco Use: Medium Risk (04/25/2022)    SDOH Interventions:     Readmission Risk Interventions     No data to display         Quintella Baton, RN, BSN  Trauma/Neuro ICU Case Manager [phone]

## 2022-04-27 NOTE — Progress Notes (Signed)
[facility] Progress Note     Subjective: CC:  Reports feeling unwell this morning, said when she rolled over there was a cracking sound from her right flank. Increasing o2 requirement but feels her SOB is stable compared to yesterday. Denies nausea or vomting.   Objective: Vital signs in last 24 hours: Temp:  [97.9 F (36.6 C)-99.3 F (37.4 C)] 97.9 F (36.6 C) (04/15 0408) Pulse Rate:  [70-100] 79 (04/15 0408) Resp:  [19-33] 20 (04/15 0408) BP: (91-139)/(43-97) 91/60 (04/15 0408) SpO2:  [91 %-95 %] 95 % (04/15 0408) Weight:  [80 kg] 80 kg (04/14 1400) Last BM Date : 04/24/22  Intake/Output from previous day: 04/14 0701 - 04/15 0700 In: 4366.1 [P.O.:480; I.V.:3138.4; Blood:747.7] Out: -  Intake/Output this shift: Total I/O In: -  Out: 500 [Urine:500]  PE: Gen:  Alert, appears uncomfortable, pleasant and cooperative Card:  Regular rate and rhythm, pedal pulses 2+ BL Pulm:  Normal effort on 4 L [place], poor air movement, decreased lung bases and no wheezing Abd: Soft, non-tender, non-distended Skin: warm and dry, no rashes  hematoma under L axilla is larger and soft. L flank contusion evolving R plank hematoma evolving, firm. No crepitance  Psych: A&Ox3   Lab Results:  Recent Labs    04/26/22 0121 04/27/22 0141  WBC 6.9 9.0  HGB 7.5* 8.4*  HCT 22.3* 25.1*  PLT 159 153   BMET Recent Labs    04/26/22 1131 04/27/22 0141  NA 134* 133*  K 4.1 4.2  CL 105 106  CO2 18* 19*  GLUCOSE 237* 181*  BUN 34* 33*  CREATININE 1.86* 1.56*  CALCIUM 8.4* 7.9*   PT/INR Recent Labs    04/24/22 1608  LABPROT 14.0  INR 1.1   CMP     Component Value Date/Time   NA 133 (L) 04/27/2022 0141   NA 137 07/22/2016 1018   NA 140 01/23/2016 1323   K 4.2 04/27/2022 0141   K 4.2 01/23/2016 1323   CL 106 04/27/2022 0141   CL 107 04/21/2012 0941   CO2 19 (L) 04/27/2022 0141   CO2 26 01/23/2016 1323   GLUCOSE 181 (H) 04/27/2022 0141   GLUCOSE 113 01/23/2016 1323    GLUCOSE 121 (H) 04/21/2012 0941   BUN 33 (H) 04/27/2022 0141   BUN 18 07/22/2016 1018   BUN 19.4 01/23/2016 1323   CREATININE 1.56 (H) 04/27/2022 0141   CREATININE 1.1 01/23/2016 1323   CALCIUM 7.9 (L) 04/27/2022 0141   CALCIUM 10.0 01/23/2016 1323   PROT 7.3 04/24/2022 1608   PROT 7.3 01/27/2016 1034   PROT 7.5 01/23/2016 1323   ALBUMIN 4.0 04/24/2022 1608   ALBUMIN 4.6 01/27/2016 1034   ALBUMIN 4.2 01/23/2016 1323   AST 876 (H) 04/24/2022 1608   AST 21 01/23/2016 1323   ALT 738 (H) 04/24/2022 1608   ALT 15 01/23/2016 1323   ALKPHOS 58 04/24/2022 1608   ALKPHOS 57 01/23/2016 1323   BILITOT 1.0 04/24/2022 1608   BILITOT 0.4 01/27/2016 1034   BILITOT 0.60 01/23/2016 1323   GFRNONAA 34 (L) 04/27/2022 0141   GFRAA 46 (L) 02/22/2017 2032   Lipase     Component Value Date/Time   LIPASE 61 (H) 02/22/2017 2032       Studies/Results: DG CHEST PORT 1 VIEW  Result Date: 04/26/2022 CLINICAL DATA:  Pneumothorax EXAM: PORTABLE CHEST 1 VIEW COMPARISON:  Chest x-ray dated 04/25/2022 and chest CT dated 04/24/2022. FINDINGS: The RIGHT-sided pneumothorax has increased in size when  compared to the most recent chest x-ray of 04/25/2022, now approximately 10% (previously less than 10%) with a current pleural displacement of approximately 13 mm. Bibasilar opacities are not significantly changed. Heart size and mediastinal contours are stable. IMPRESSION: RIGHT-sided pneumothorax has increased in size when compared to yesterday's chest x-ray of 04/25/2022, now approximately 10% or slightly more (less than 10% yesterday) with a current pleural displacement of approximately 13 mm. Consider chest tube placement. At minimum, recommend short-term follow-up chest x-ray later today. These results will be called to the ordering clinician or representative by the Radiologist Assistant, and communication documented in the PACS or [employer]. Electronically Signed   By: Bary Richard M.D.   On:  04/26/2022 09:49   DG THORACOLUMBAR SPINE  Result Date: 04/25/2022 CLINICAL DATA:  Thoracic spine fracture EXAM: THORACOLUMBAR SPINE 1V COMPARISON:  CT thoracolumbar spine dated 04/24/2022 FINDINGS: On CT, there is a fracture involving the anterior inferior corner of the T11 vertebral body, extending into the T11-12 bridging osteophyte, and into the right posterior elements at T12. However, this is not radiographically visible. Normal thoracic kyphosis. No fracture or dislocation is seen. Bulky anterior osteophytosis. Known medial right rib fractures are better evaluated on CT Additionally, on CT, there are nondisplaced fractures involving the right L1-3 transverse processes. This is also not radiographically visible. Normal lumbar lordosis. No fracture or dislocation is seen. Vascular calcifications. Cholecystectomy clips. IMPRESSION: Known thoracolumbar and medial rib fractures on CT are not radiographically evident, as described above. Electronically Signed   By: Charline Bills M.D.   On: 04/25/2022 21:07   DG Chest Port 1 View  Result Date: 04/25/2022 CLINICAL DATA:  Traumatic pneumothorax EXAM: PORTABLE CHEST 1 VIEW COMPARISON:  Yesterday FINDINGS: Small right apical pneumothorax. Low volume chest with hazy density at the bases. Small pleural effusions are possible. Cardiopericardial enlargement. Prior CABG. IMPRESSION: 1. Small right apical pneumothorax, less than 10%. 2. Worsening aeration with atelectasis or infiltrate in the lower lobes. Electronically Signed   By: Tiburcio Pea M.D.   On: 04/25/2022 08:27    Anti-infectives: Anti-infectives (From admission, onward)    None        Assessment/Plan  77 y/o F s/p fall from 8-10 feet without LOC T11 vertebral body FX - NS c/s, Dr. Yetta Barre. Non-op mgmt. Plan upright films with no visible FX. Non-op mgmt with TLSO L1-3 TP FX  Small right anterior PTX - CXR this morning with additional increase in PTX. Increased O2 requirement. May need a  chest tube. Will discuss with the patient and Dr. Bedelia Person.  R Posterior Rib FX 11-12 - multimodal pain control  L hand abrasion - no underlying FX, chronic OA  ABL anemia - S/p 1 u pRBC yestrday for hgb 7.5 >> 8.4 this AM, appropriate rise in hgb. Monitor. AKI - creatinine 1.56 from 1.86 yesterday, improved after IVF and discontinuation of toradol. Monitor. Diabetes mellitus - SSI CAD, PAD - cardiologist Dr. Elease Hashimoto, hold DAPT GERD   FEN - Reg, SLIV VTE - SCD's, hold ASA and plavix, lovenox held 4/14 due to anemia requiring transfusion  ID - given ancef and tdap in ED 4/12 Admit - trauma, likely chest tube placement, will confirm with patient and MD PT/OT     LOS: 2 days   I reviewed nursing notes, ED provider notes, Consultant NS notes, last 24 h vitals and pain scores, last 48 h intake and output, last 24 h labs and trends, and last 24 h imaging results.  This care  required moderate level of medical decision making.   Hosie Spangle, PA-C [facility] Please see Amion for pager number during day hours 7:00am-4:30pm

## 2022-04-27 NOTE — Significant Event (Signed)
Rapid Response Event Note   Reason for Call :  O2 sat 70s post Chest Tube placement  Initial Focused Assessment:  Patient is lying in bed.  She is in pain, states it was a 10/10 now is 4/10.  She is holding her breath at times.   Dr Bedelia Person at bedside  BP 133/50  HR 91  RR 23-28  O2 sats 93% on NRB   Interventions:  2mg  Morphine PXCR done (PA at bedside viewed)  Patient more comfortable   Plan of Care:  Keep on NRB for about and hour then try to wean to [place].  Notify if unable to wean O2    Event Summary:   MD Notified: Dr Bedelia Person at bedside Call Time: 1550 Arrival Time: 1553 End Time: 1630  Marcellina Millin, RN

## 2022-04-28 ENCOUNTER — Inpatient Hospital Stay (HOSPITAL_COMMUNITY): Payer: Medicare Other

## 2022-04-28 DIAGNOSIS — J939 Pneumothorax, unspecified: Secondary | ICD-10-CM | POA: Diagnosis not present

## 2022-04-28 DIAGNOSIS — J439 Emphysema, unspecified: Secondary | ICD-10-CM | POA: Diagnosis not present

## 2022-04-28 LAB — BASIC METABOLIC PANEL
Anion gap: 7 (ref 5–15)
BUN: 23 mg/dL (ref 8–23)
CO2: 19 mmol/L — ABNORMAL LOW (ref 22–32)
Calcium: 7.7 mg/dL — ABNORMAL LOW (ref 8.9–10.3)
Chloride: 108 mmol/L (ref 98–111)
Creatinine, Ser: 1.19 mg/dL — ABNORMAL HIGH (ref 0.44–1.00)
GFR, Estimated: 48 mL/min — ABNORMAL LOW (ref >60)
Glucose, Bld: 156 mg/dL — ABNORMAL HIGH (ref 70–99)
Potassium: 4 mmol/L (ref 3.5–5.1)
Sodium: 134 mmol/L — ABNORMAL LOW (ref 135–145)

## 2022-04-28 LAB — CBC
HCT: 27.1 % — ABNORMAL LOW (ref 36.0–46.0)
Hemoglobin: 9.2 g/dL — ABNORMAL LOW (ref 12.0–15.0)
MCH: 30.5 pg (ref 26.0–34.0)
MCHC: 33.9 g/dL (ref 30.0–36.0)
MCV: 89.7 fL (ref 80.0–100.0)
Platelets: 199 10*3/uL (ref 150–400)
RBC: 3.02 MIL/uL — ABNORMAL LOW (ref 3.87–5.11)
RDW: 15.8 % — ABNORMAL HIGH (ref 11.5–15.5)
WBC: 8.5 10*3/uL (ref 4.0–10.5)
nRBC: 0 % (ref 0.0–0.2)

## 2022-04-28 LAB — GLUCOSE, CAPILLARY
Glucose-Capillary: 134 mg/dL — ABNORMAL HIGH (ref 70–99)
Glucose-Capillary: 123 mg/dL — ABNORMAL HIGH (ref 70–99)
Glucose-Capillary: 149 mg/dL — ABNORMAL HIGH (ref 70–99)
Glucose-Capillary: 149 mg/dL — ABNORMAL HIGH (ref 70–99)
Glucose-Capillary: 178 mg/dL — ABNORMAL HIGH (ref 70–99)

## 2022-04-28 MED ORDER — ENOXAPARIN SODIUM 30 MG/0.3ML IJ SOSY
30.0000 mg | PREFILLED_SYRINGE | Freq: Two times a day (BID) | INTRAMUSCULAR | Status: DC
  Administered 2022-04-28 – 2022-05-01 (×7): 30 mg via SUBCUTANEOUS
  Filled 2022-04-28 (×7): qty 0.3

## 2022-04-28 NOTE — Progress Notes (Signed)
Physical Therapy Treatment Patient Details Name: Taylor [name] MRN: [id number] DOB: Sep 11, 1945 Today's Date: 04/28/2022   History of Present Illness Pt is 77 year old female admitted 4/12 after falling through a deck. She has suffered right-sided rib fxs and fx at T11, L1-3 TP FX and a soft tissue injury to the right flank. Right anterior PTX - gradual increase in size of pneumothorax, chest tube placed 4/15. No PTX on AM CXR, continue to -20cm suction 4/16, plan CXR 4/17. Pt has history of L breast cancer s/p mastectomy, removal of lymph nodes in RUE ~4 mos prior, hypertension, PAD, Pulmonary emobolism, and DMII.    PT Comments    Pt received in supine, agreeable to therapy session after premedication by RN with IV pain meds, pt with noted increased work of breathing this date and of note now with chest tube to suction and increased supplemental O2 needs compared with initial eval. Pt with poor recall of back precs and needing increased assist up to +2 modA for log roll to EOB and step pivot transfer to/from [facility]. Pt quick to fatigue and unable to progress ambulation away from bed this date, but demonstrates good participation and may benefit from post-acute therapies >3 hours/day vs HHPT pending progress in next 1-2 sessions. Pt reports spouse may be here tomorrow AM for caregiver instruction and further discussion regarding disposition plan. Pt continues to benefit from PT services to progress toward functional mobility goals.    Recommendations for follow up therapy are one component of a multi-disciplinary discharge planning process, led by the attending physician.  Recommendations may be updated based on patient status, additional functional criteria and insurance authorization.  Follow Up Recommendations       Assistance Recommended at Discharge Frequent or constant Supervision/Assistance  Patient can return home with the following Assistance with cooking/housework;Assist for  transportation;Help with stairs or ramp for entrance;A lot of help with walking and/or transfers;A lot of help with bathing/dressing/bathroom   Equipment Recommendations  Other (comment) (gave different answers to PT and OT as to what equipment she has. Will need RW if cannot confirm that she currently has one, may need 3in1 and hospital bed/supplemental O2 pending progress)    Recommendations for Other Services  (may need rehab consult pending progress next couple of sessions)     Precautions / Restrictions Precautions Precautions: Fall;Back Precaution Booklet Issued: No Precaution Comments: verbal/visual demo, pt with poor recall from previous session Required Braces or Orthoses: Spinal Brace Spinal Brace: Thoracolumbosacral orthotic;Applied in sitting position Restrictions Weight Bearing Restrictions: No     Mobility  Bed Mobility Overal bed mobility: Needs Assistance Bed Mobility: Rolling, Sidelying to Sit, Sit to Sidelying Rolling: Min assist   Supine to sit: Mod assist, +2 for safety/equipment, HOB elevated   Sit to sidelying: Mod assist General bed mobility comments: Pt impulsively pulled trunk from elevated HOB to long sitting despite cues on initial log roll, pt trunk supported by RN and PTA assisting with BLE to slowly rotate with bed pad assist to avoid twisting her back. TLSO donned in long sitting prior to pt rotating to EOB. Pt then given visual/verbal demo by PTA on proper technique for log roll, but still needs multimodal cues for carryover with return from sitting to supine in flat bed and to avoid twisting.    Transfers Overall transfer level: Needs assistance Equipment used: Rolling walker (2 wheels) Transfers: Sit to/from Stand Sit to Stand: Mod assist, From elevated surface   Step pivot transfers: Mod assist, From  elevated surface       General transfer comment: increased ModA for sit>stand from EOB and to [facility], then minA to stand from elevated BSC with  initial verbal/tactile cues for safe UE placement within back precs; painful transition, but pt with fair eccentric control for stand>sit. Pt c/o BLE weakness and pt needed modA for weight shifting assist and some manual assist to slide LLE when stepping as she fatigued with return pivot back to bed.    Ambulation/Gait               General Gait Details: defer, pt very fatigued after step pivot tf x2 and unable to progress; also on CT to wall suction   Stairs             Wheelchair Mobility    Modified Rankin (Stroke Patients Only)       Balance Overall balance assessment: Needs assistance Sitting-balance support: No upper extremity supported, Feet supported Sitting balance-Leahy Scale: Fair Sitting balance - Comments: intermittent minA due to lateral lean with fatigue   Standing balance support: Bilateral upper extremity supported, Reliant on assistive device for balance Standing balance-Leahy Scale: Poor Standing balance comment: heavy reliance on RW for static standing and weight shifting                            Cognition Arousal/Alertness: Awake/alert Behavior During Therapy: WFL for tasks assessed/performed Overall Cognitive Status: No family/caregiver present to determine baseline cognitive functioning                                 General Comments: Pt with poor recall of back precautions from previous session and decreased insight into deficits and complexity of medical needs, initially pt states "I might be able to go home tomorrow" although still has CT to suction, supplemental O2 and not yet able to take more than pivotal steps away from the bed. Unsure of pt baseline cognition. Pt says spouse may be here in AM for caregiver instruction following date and to further assess disposition plan.        Exercises General Exercises - Lower Extremity Ankle Circles/Pumps: AROM, Both, 10 reps, Supine Hip ABduction/ADduction: AAROM,  Both, 5 reps, Supine (moving BLE to EOB) Straight Leg Raises: AAROM, Both, 5 reps, Supine Hip Flexion/Marching: AROM, Both, 5 reps, Standing (limited ROM due to pain/fatigue) Other Exercises Other Exercises: midline sitting with pursed-lip breathing x10 reps    General Comments General comments (skin integrity, edema, etc.): SpO2 WFL when finger sensor getting accurate signal (noisy signal using RW); HR 79 bpm and SpO2 92-98% on 3L O2 [place]      Pertinent Vitals/Pain Pain Assessment Pain Assessment: Faces Faces Pain Scale: Hurts even more Pain Location: R flank (CT site), back Pain Descriptors / Indicators: Aching, Guarding, Grimacing Pain Intervention(s): Limited activity within patient's tolerance, Monitored during session, Premedicated before session, Repositioned (RN gave IV meds a few mins prior to session)     PT Goals (current goals can now be found in the care plan section) Acute Rehab PT Goals Patient Stated Goal: to breathe and move better, less pain so I can go home PT Goal Formulation: With patient Time For Goal Achievement: 05/09/22 Progress towards PT goals: Not progressing toward goals - comment (functional decline on Monday, may need post-acute therapies pending progress)    Frequency    Min 4X/week  PT Plan Other (comment) (dispo TBD pending progress)       AM-PAC PT "6 Clicks" Mobility   Outcome Measure  Help needed turning from your back to your side while in a flat bed without using bedrails?: A Little Help needed moving from lying on your back to sitting on the side of a flat bed without using bedrails?: A Lot Help needed moving to and from a bed to a chair (including a wheelchair)?: A Lot Help needed standing up from a chair using your arms (e.g., wheelchair or bedside chair)?: A Lot Help needed to walk in hospital room?: Total Help needed climbing 3-5 steps with a railing? : Total 6 Click Score: 11    End of Session Equipment Utilized During  Treatment: Gait belt;Back brace;Oxygen Activity Tolerance: Patient limited by fatigue;Patient limited by pain Patient left: with call bell/phone within reach;in bed;with bed alarm set;with SCD's reapplied;Other (comment) (bed in chair posture, pt set up to eat dinner) Nurse Communication: Mobility status;Other (comment);Precautions (pt has hx bil lymph node removal in UE, may be better not to use BUE for BP readings) PT Visit Diagnosis: Unsteadiness on feet (R26.81);Other abnormalities of gait and mobility (R26.89);Muscle weakness (generalized) (M62.81);Difficulty in walking, not elsewhere classified (R26.2);Pain Pain - Right/Left:  (back, R flank) Pain - part of body:  (back, R flank)     Time: 4098-1191 PT Time Calculation (min) (ACUTE ONLY): 53 min  Charges:  $Therapeutic Exercise: 8-22 mins $Therapeutic Activity: 38-52 mins                     Kenwood Rosiak P., PTA Acute Rehabilitation Services Secure Chat Preferred 9a-5:30pm Office: [phone]    Dorathy Kinsman [facility] 04/28/2022, 6:34 PM

## 2022-04-28 NOTE — Care Management Important Message (Signed)
Important Message  Patient Details  Name: Taylor [name] MRN: [id number] Date of Birth: 1945-07-30   Medicare Important Message Given:  Yes     Renie Ora 04/28/2022, 4:25 PM

## 2022-04-28 NOTE — Progress Notes (Signed)
[facility] Progress Note     Subjective: CC:   Reports work of breathing has improved. Feels like her right chest is still hurting a lot. The pain improves with medication but the patient states she does not like taking a bunch of pain meds, "I guess I'm afraid of getting hooked". She is tolerating PO. Remains on 4L O2 via [place]. 500 mL on IS.  Objective: Vital signs in last 24 hours: Temp:  [98 F (36.7 C)-99.5 F (37.5 C)] 98.1 F (36.7 C) (04/16 0700) Pulse Rate:  [63-91] 68 (04/16 0700) Resp:  [15-23] 17 (04/16 0700) BP: (108-142)/(48-53) 135/51 (04/16 0700) SpO2:  [93 %-97 %] 94 % (04/16 0700) Last BM Date : 04/24/22  Intake/Output from previous day: 04/15 0701 - 04/16 0700 In: 1396.7 [P.O.:480; I.V.:916.7] Out: 1650 [Urine:1500; Chest Tube:150] Intake/Output this shift: No intake/output data recorded.  PE: Gen:  Alert, cooperative  Card:  Regular rate and rhythm, pedal pulses 2+ BL Pulm:  Normal effort on 4 L [place], CTAB decreased lung sounds at  bases and no wheezing or crackles   R chest tube with SS fluid in cannister (150 mL/24h), no air leak on - 20 cm Abd: Soft, non-tender, non-distended Skin: warm and dry, no rashes  Flank hematomas stable. Psych: A&Ox3   Lab Results:  Recent Labs    04/27/22 0141 04/28/22 0852  WBC 9.0 8.5  HGB 8.4* 9.2*  HCT 25.1* 27.1*  PLT 153 199   BMET Recent Labs    04/27/22 0141 04/28/22 0852  NA 133* 134*  K 4.2 4.0  CL 106 108  CO2 19* 19*  GLUCOSE 181* 156*  BUN 33* 23  CREATININE 1.56* 1.19*  CALCIUM 7.9* 7.7*   PT/INR No results for input(s): "LABPROT", "INR" in the last 72 hours.  CMP     Component Value Date/Time   NA 134 (L) 04/28/2022 0852   NA 137 07/22/2016 1018   NA 140 01/23/2016 1323   K 4.0 04/28/2022 0852   K 4.2 01/23/2016 1323   CL 108 04/28/2022 0852   CL 107 04/21/2012 0941   CO2 19 (L) 04/28/2022 0852   CO2 26 01/23/2016 1323   GLUCOSE 156 (H) 04/28/2022 0852   GLUCOSE 113  01/23/2016 1323   GLUCOSE 121 (H) 04/21/2012 0941   BUN 23 04/28/2022 0852   BUN 18 07/22/2016 1018   BUN 19.4 01/23/2016 1323   CREATININE 1.19 (H) 04/28/2022 0852   CREATININE 1.1 01/23/2016 1323   CALCIUM 7.7 (L) 04/28/2022 0852   CALCIUM 10.0 01/23/2016 1323   PROT 7.3 04/24/2022 1608   PROT 7.3 01/27/2016 1034   PROT 7.5 01/23/2016 1323   ALBUMIN 4.0 04/24/2022 1608   ALBUMIN 4.6 01/27/2016 1034   ALBUMIN 4.2 01/23/2016 1323   AST 876 (H) 04/24/2022 1608   AST 21 01/23/2016 1323   ALT 738 (H) 04/24/2022 1608   ALT 15 01/23/2016 1323   ALKPHOS 58 04/24/2022 1608   ALKPHOS 57 01/23/2016 1323   BILITOT 1.0 04/24/2022 1608   BILITOT 0.4 01/27/2016 1034   BILITOT 0.60 01/23/2016 1323   GFRNONAA 48 (L) 04/28/2022 0852   GFRAA 46 (L) 02/22/2017 2032   Lipase     Component Value Date/Time   LIPASE 61 (H) 02/22/2017 2032       Studies/Results: DG CHEST PORT 1 VIEW  Result Date: 04/28/2022 CLINICAL DATA:  Rib fractures, right chest tube, pneumothorax. EXAM: PORTABLE CHEST 1 VIEW COMPARISON:  Chest radiograph 1 day prior.  FINDINGS: The right apical chest tube is stable in position. Median sternotomy wires are again noted. The cardiomediastinal silhouette is stable. There is unchanged consolidative opacity in the left base with a probable small left pleural effusion, unchanged. Hazy opacities in the right base are unchanged. There is no significant right effusion. There is no appreciable pneumothorax. Subcutaneous emphysema in the right chest wall is again seen, decreased in the interim. A mildly displaced fracture of an inferior right lateral rib is again noted. The other known rib fractures are not well seen on the current study. IMPRESSION: 1. Stable right apical chest tube with no appreciable pneumothorax. 2. Bibasilar airspace opacities and probable small left pleural effusion are unchanged. Electronically Signed   By: Lesia Hausen M.D.   On: 04/28/2022 08:49   DG Chest Port 1  View  Result Date: 04/27/2022 CLINICAL DATA:  Respiratory failure, pneumothorax EXAM: PORTABLE CHEST 1 VIEW COMPARISON:  04/27/2022 FINDINGS: Right apical pigtail chest tube is been placed and right pneumothorax has been evacuated. Bibasilar atelectasis or infiltrate persists. No pleural effusion. Median sternotomy has been performed. Cardiac size within normal limits. Known right rib fractures are not well appreciated on this examination. Subcutaneous gas noted within the right chest wall. Surgical clips again noted within the right axilla. IMPRESSION: 1. Interval evacuation of right pneumothorax status post right apical chest tube placement. 2. Stable bibasilar atelectasis or infiltrate. Electronically Signed   By: Helyn Numbers M.D.   On: 04/27/2022 16:20   DG CHEST PORT 1 VIEW  Result Date: 04/27/2022 CLINICAL DATA:  Pneumothorax. EXAM: PORTABLE CHEST 1 VIEW COMPARISON:  04/26/2022. FINDINGS: 0615 hours. Continued increase in the right-sided pneumothorax. Small bilateral pleural effusions with associated bibasilar atelectasis. Stable cardiac and mediastinal contours with postoperative changes of median sternotomy and CABG. IMPRESSION: Continued increase in the right-sided pneumothorax. Electronically Signed   By: Orvan Falconer M.D.   On: 04/27/2022 08:33    Anti-infectives: Anti-infectives (From admission, onward)    None        Assessment/Plan  77 y/o F s/p fall from 8-10 feet without LOC T11 vertebral body FX - NS c/s, Dr. Yetta Barre. Non-op mgmt. Plan upright films with no visible FX. Non-op mgmt with TLSO L1-3 TP FX  right anterior PTX - gradual increase in size of PTX >> chest tube placed 4/15. No PTX on AM CXR. Continue to -20cm suction today. CXR In AM. R Posterior Rib FX 11-12 - multimodal pain control  L hand abrasion - no underlying FX, chronic OA  ABL anemia - S/p 1 u pRBC 4/14 for hgb 7.5. hgh improving. 9.2 today AKI - creatinine 1.86 > 1.56 > 1.19. improving with gently IVF  and holding nephrotoxic agents. Diabetes mellitus - SSI CAD, PAD - cardiologist Dr. Elease Hashimoto, hold DAPT GERD   FEN - Reg, SLIV VTE - SCD's, start lovenox today.  ID - given ancef and tdap in ED 4/12 Admit - trauma, chest tube management PT/OT     LOS: 3 days   I reviewed nursing notes, ED provider notes, Consultant NS notes, last 24 h vitals and pain scores, last 48 h intake and output, last 24 h labs and trends, and last 24 h imaging results.  This care required moderate level of medical decision making.   Hosie Spangle, PA-C [facility] Please see Amion for pager number during day hours 7:00am-4:30pm

## 2022-04-29 ENCOUNTER — Ambulatory Visit: Payer: Medicare Other

## 2022-04-29 ENCOUNTER — Inpatient Hospital Stay (HOSPITAL_COMMUNITY): Payer: Medicare Other

## 2022-04-29 LAB — GLUCOSE, CAPILLARY
Glucose-Capillary: 156 mg/dL — ABNORMAL HIGH (ref 70–99)
Glucose-Capillary: 147 mg/dL — ABNORMAL HIGH (ref 70–99)
Glucose-Capillary: 178 mg/dL — ABNORMAL HIGH (ref 70–99)
Glucose-Capillary: 153 mg/dL — ABNORMAL HIGH (ref 70–99)

## 2022-04-29 NOTE — Progress Notes (Signed)
SATURATION QUALIFICATIONS: (This note is used to comply with regulatory documentation for home oxygen)  Patient Saturations on Room Air at Rest = 94%  Patient Saturations on Room Air while Ambulating = 84%  Patient Saturations on 2 Liters of oxygen while Ambulating = 91%  Please briefly explain why patient needs home oxygen: Pt currently hypoxic on RA with any exertional tasks and demonstrates short, shallow breaths despite cues for pursed-lip breathing.

## 2022-04-29 NOTE — Progress Notes (Cosign Needed Addendum)
Physical Therapy Treatment Patient Details Name: Taylor [name] MRN: [id number] DOB: [birth year] Today's Date: 04/29/2022   History of Present Illness Pt is 77 year old female admitted 4/12 after falling through a deck. She has suffered right-sided rib fxs and fx at T11, L1-3 TP FX and a soft tissue injury to the right flank. Right anterior PTX - gradual increase in size of pneumothorax, chest tube placed 4/15. No PTX on AM CXR, continue to -20cm suction 4/16, plan CXR 4/17. Pt has history of L breast cancer s/p mastectomy, removal of lymph nodes in RUE ~4 mos prior, hypertension, PAD, Pulmonary emobolism, and DMII.    PT Comments    Pt received in supine, mildly dyspneic and grimacing but agreeable to therapy session and with good participation and fair tolerance for bed mobility, precaution instruction, and transfer training. Pt medicated by RN at beginning of session with PO meds and pt c/o moderate to severe pain throughout with very shallow, rapid breaths. PTA obtained RN approval to place 2L O2 [place] for gait trial, SpO2 90% and greater on 2L for ~69ft but then pt had to sit due to 3/4 DOE and c/o increased sternal discomfort. Pt reports pain improving while in recliner and encouraged use of IS and pursed-lip breathing. Pt with poor recall of back precautions from previous session. Disposition updated below after discussion with pt and supervising PT Logan B, pending progress as pt progressing more slowly than expected, no family present to discuss disposition this date will follow up next session pending pt progress. Pt continues to benefit from PT services to progress toward functional mobility goals.  Recommendations for follow up therapy are one component of a multi-disciplinary discharge planning process, led by the attending physician.  Recommendations may be updated based on patient status, additional functional criteria and insurance authorization.  Follow Up Recommendations        Assistance Recommended at Discharge Frequent or constant Supervision/Assistance  Patient can return home with the following Assistance with cooking/housework;Assist for transportation;Help with stairs or ramp for entrance;A lot of help with bathing/dressing/bathroom;A lot of help with walking and/or transfers;Direct supervision/assist for medications management   Equipment Recommendations  Other (comment) (gave different answers to PT and OT as to what equipment she has. Will need RW if cannot confirm that she currently has one, may need 3in1 and hospital bed/supplemental O2 pending progress)    Recommendations for Other Services Rehab consult (may need rehab consult pending progress next couple of sessions)     Precautions / Restrictions Precautions Precautions: Fall;Back Precaution Booklet Issued: Yes (comment) Precaution Comments: verbal/visual demo, pt with poor recall from previous session, handout given Required Braces or Orthoses: Spinal Brace Spinal Brace: Thoracolumbosacral orthotic;Applied in sitting position Restrictions Weight Bearing Restrictions: No     Mobility  Bed Mobility Overal bed mobility: Needs Assistance Bed Mobility: Rolling, Sidelying to Sit Rolling: Min assist Sidelying to sit: Mod assist, +2 for physical assistance       General bed mobility comments: Pt cued for back precs (verbally reviewed in previous session) but she was unable to recall, so PTA gave pt a visual/verbal demo on proper technique for log roll, but still needs multimodal cues for carryover. BLE and trunk support +2 needed to sit fully upright, and modA for bil hip translation to foot flat.    Transfers Overall transfer level: Needs assistance Equipment used: Rolling walker (2 wheels) Transfers: Sit to/from Stand Sit to Stand: From elevated surface, Min assist   Step pivot transfers: From elevated  surface, Min assist       General transfer comment: From very elevated bed  surface<>RW and RW<>BSC, initial verbal/tactile cues for safe UE placement within back precs; painful transition, but pt with fair eccentric control for stand>sit on BSC and on recliner chair (with pillows on seat). Pt with slightly improved foot clearance this date but still with small low steps and needs dense cues for posture, RW management and body mechanics.    Ambulation/Gait Ambulation/Gait assistance: Min assist Gait Distance (Feet): 6 Feet Assistive device: Rolling walker (2 wheels) Gait Pattern/deviations: Decreased stride length, Step-to pattern, Narrow base of support, Shuffle Gait velocity: <0.2 m/s     General Gait Details: Pt with slightly improved foot clearance this date but still with small low steps and needs dense cues for posture, RW management and body mechanics. Pt c/o increased severe pain after ~44ft so chair pulled up for pt to sit in, pt on 2L O2 [place] for gait task but weaned to RA per RN request once in recliner. HR 112-128 bpm with exertion and was 78 bpm resting. Pt placed on 2L O2 [place] for gait trial as she desat to SpO2 80-86% with pivot to BSC on RA and with shallow breaths. On 2L, SpO2 90% and greater but only ~4ft distance.      Balance Overall balance assessment: Needs assistance Sitting-balance support: No upper extremity supported, Feet supported Sitting balance-Leahy Scale: Fair Sitting balance - Comments: intermittent minA due to lateral lean with fatigue   Standing balance support: Bilateral upper extremity supported, Reliant on assistive device for balance Standing balance-Leahy Scale: Poor Standing balance comment: heavy reliance on RW for static standing and weight shifting                            Cognition Arousal/Alertness: Awake/alert Behavior During Therapy: WFL for tasks assessed/performed Overall Cognitive Status: No family/caregiver present to determine baseline cognitive functioning                                  General Comments: Pt with poor recall of back precautions from previous session and decreased insight into deficits although improving slightly this date. Pt admits to "feeling foggy", especially after taking pain meds. Unsure of pt baseline cognition. Yesterday pt said spouse would be here today in AM, but per RN he hasn't been visiting, pt states some other family members were expected in lieu of spouse, not in room yet at time of session.        Exercises General Exercises - Lower Extremity Ankle Circles/Pumps: AROM, Both, 10 reps, Supine Other Exercises Other Exercises: midline sitting with pursed-lip breathing x10 reps x3 sets Other Exercises: IS x 10 reps (pt achieves 200-500 mL with dense initial cues for technique)    General Comments General comments (skin integrity, edema, etc.): see gait comments; needed 2L O2 for exertional tasks      Pertinent Vitals/Pain Pain Assessment Pain Assessment: Faces Faces Pain Scale: Hurts whole lot Pain Location: R flank (CT site), back, sternal Pain Descriptors / Indicators: Aching, Guarding, Grimacing Pain Intervention(s): Limited activity within patient's tolerance, Monitored during session, Repositioned, RN gave pain meds during session (and pt c/o some anterior sternal pain but mostly R flank (rib and CT site) pain and hip pain)           PT Goals (current goals can now be found in  the care plan section) Acute Rehab PT Goals Patient Stated Goal: to breathe and move better, less pain so I can go home PT Goal Formulation: With patient Time For Goal Achievement: 05/09/22 Progress towards PT goals: Progressing toward goals    Frequency    Min 4X/week      PT Plan Other (comment) (dispo TBD pending progress)       AM-PAC PT "6 Clicks" Mobility   Outcome Measure  Help needed turning from your back to your side while in a flat bed without using bedrails?: A Little Help needed moving from lying on your back to sitting on the  side of a flat bed without using bedrails?: A Lot Help needed moving to and from a bed to a chair (including a wheelchair)?: A Lot Help needed standing up from a chair using your arms (e.g., wheelchair or bedside chair)?: A Lot Help needed to walk in hospital room?: Total (<33ft) Help needed climbing 3-5 steps with a railing? : Total 6 Click Score: 11    End of Session Equipment Utilized During Treatment: Gait belt;Back brace;Oxygen Activity Tolerance: Patient limited by pain;Treatment limited secondary to medical complications (Comment);Other (comment) (DOE, sternal and R flank/rib pain) Patient left: with call bell/phone within reach;Other (comment);in chair;with chair alarm set (heels floated, 90* posture in recliner, TLSO still donned) Nurse Communication: Mobility status;Other (comment);Precautions (pt has hx bil lymph node removal in UE, may be better not to use BUE for BP readings) PT Visit Diagnosis: Unsteadiness on feet (R26.81);Other abnormalities of gait and mobility (R26.89);Muscle weakness (generalized) (M62.81);Difficulty in walking, not elsewhere classified (R26.2);Pain Pain - Right/Left:  (back, R flank) Pain - part of body:  (back, R flank)     Time: 0981-1914 PT Time Calculation (min) (ACUTE ONLY): 46 min  Charges:  $Gait Training: 8-22 mins $Therapeutic Activity: 23-37 mins                     Taylor [name] P., PTA Acute Rehabilitation Services Secure Chat Preferred 9a-5:30pm Office: [phone]    [place] 04/29/2022, 3:11 PM

## 2022-04-29 NOTE — Progress Notes (Signed)
Inpatient Rehab Admissions Coordinator Note:   Per therapy updated recommendations patient was screened for CIR candidacy by Stephania Fragmin, PT. At this time, pt appears to be a potential candidate for CIR. I will place an order for rehab consult for full assessment, per our protocol.  Please contact me any with questions.Estill Dooms, PT, DPT [phone] 04/29/22 3:46 PM

## 2022-04-29 NOTE — TOC Progression Note (Signed)
Transition of Care [facility]) - Progression Note    Patient Details  Name: Taylor [name] MRN: [id number] Date of Birth: April 01, 1945  [facility]) CM/SW Contact  Glennon Mac, RN Phone Number: 04/29/2022, 4:12 PM  Clinical Narrative:    Noted PT now recommending CIR, and consult has been placed.  Will continue to follow as patient progresses.    Expected Discharge Plan: Home w Home Health Services Barriers to Discharge: Continued Medical Work up  Expected Discharge Plan and Services     Post Acute Care Choice: Home Health Living arrangements for the past 2 months: Single Family Home                           HH Arranged: PT, OT HH Agency: [facility] Date [facility] Agency Contacted: 04/27/22 Time HH Agency Contacted: 1601 Representative spoke with at [facility] Agency: Kandee Keen   Social Determinants of Health (SDOH) Interventions SDOH Screenings   Food Insecurity: No Food Insecurity (04/24/2022)  Housing: Low Risk  (04/24/2022)  Transportation Needs: No Transportation Needs (04/24/2022)  Utilities: Not At Risk (04/24/2022)  Tobacco Use: Medium Risk (04/25/2022)    Readmission Risk Interventions     No data to display         Quintella Baton, RN, BSN  Trauma/Neuro ICU Case Manager [phone]

## 2022-04-29 NOTE — Progress Notes (Signed)
[facility] Progress Note     Subjective: CC:   Reports work of breathing has improved. Feels like her right chest is still hurting a lot. The pain improves with medication but the patient states she does not like taking a bunch of pain meds, "I guess I'm afraid of getting hooked". She is tolerating PO. Remains on 4L O2 via [place]. 500 mL on IS.  Objective: Vital signs in last 24 hours: Temp:  [98 F (36.7 C)-98.5 F (36.9 C)] 98.2 F (36.8 C) (04/17 0341) Pulse Rate:  [63-80] 65 (04/17 0341) Resp:  [17-22] 20 (04/17 0341) BP: (90-147)/(39-74) 90/70 (04/17 0341) SpO2:  [95 %-98 %] 96 % (04/17 0341) Last BM Date : 04/24/22  Intake/Output from previous day: 04/16 0701 - 04/17 0700 In: 1077.1 [I.V.:1077.1] Out: 2330 [Urine:2150; Chest Tube:180] Intake/Output this shift: No intake/output data recorded.  PE: Gen:  Alert, cooperative  Card:  Regular rate and rhythm, pedal pulses 2+ BL Pulm:  Normal effort on 4 L [place], CTAB decreased lung sounds at  bases and no wheezing or crackles   R chest tube with SS fluid in cannister (150 mL/24h), no air leak on - 20 cm Abd: Soft, non-tender, non-distended Skin: warm and dry, no rashes  Flank hematomas stable. Psych: A&Ox3   Lab Results:  Recent Labs    04/27/22 0141 04/28/22 0852  WBC 9.0 8.5  HGB 8.4* 9.2*  HCT 25.1* 27.1*  PLT 153 199   BMET Recent Labs    04/27/22 0141 04/28/22 0852  NA 133* 134*  K 4.2 4.0  CL 106 108  CO2 19* 19*  GLUCOSE 181* 156*  BUN 33* 23  CREATININE 1.56* 1.19*  CALCIUM 7.9* 7.7*   PT/INR No results for input(s): "LABPROT", "INR" in the last 72 hours.  CMP     Component Value Date/Time   NA 134 (L) 04/28/2022 0852   NA 137 07/22/2016 1018   NA 140 01/23/2016 1323   K 4.0 04/28/2022 0852   K 4.2 01/23/2016 1323   CL 108 04/28/2022 0852   CL 107 04/21/2012 0941   CO2 19 (L) 04/28/2022 0852   CO2 26 01/23/2016 1323   GLUCOSE 156 (H) 04/28/2022 0852   GLUCOSE 113 01/23/2016  1323   GLUCOSE 121 (H) 04/21/2012 0941   BUN 23 04/28/2022 0852   BUN 18 07/22/2016 1018   BUN 19.4 01/23/2016 1323   CREATININE 1.19 (H) 04/28/2022 0852   CREATININE 1.1 01/23/2016 1323   CALCIUM 7.7 (L) 04/28/2022 0852   CALCIUM 10.0 01/23/2016 1323   PROT 7.3 04/24/2022 1608   PROT 7.3 01/27/2016 1034   PROT 7.5 01/23/2016 1323   ALBUMIN 4.0 04/24/2022 1608   ALBUMIN 4.6 01/27/2016 1034   ALBUMIN 4.2 01/23/2016 1323   AST 876 (H) 04/24/2022 1608   AST 21 01/23/2016 1323   ALT 738 (H) 04/24/2022 1608   ALT 15 01/23/2016 1323   ALKPHOS 58 04/24/2022 1608   ALKPHOS 57 01/23/2016 1323   BILITOT 1.0 04/24/2022 1608   BILITOT 0.4 01/27/2016 1034   BILITOT 0.60 01/23/2016 1323   GFRNONAA 48 (L) 04/28/2022 0852   GFRAA 46 (L) 02/22/2017 2032   Lipase     Component Value Date/Time   LIPASE 61 (H) 02/22/2017 2032       Studies/Results: DG CHEST PORT 1 VIEW  Result Date: 04/29/2022 CLINICAL DATA:  Right chest tube in place, pneumothorax. EXAM: PORTABLE CHEST 1 VIEW COMPARISON:  Chest radiograph 1 day prior FINDINGS:  The right apical chest tube is stable. Median sternotomy wires are again noted. The cardiomediastinal silhouette is stable. Consolidative retrocardiac opacity and probable small left pleural effusion are unchanged. Patchy opacities in the right base are not significantly changed. There is no appreciable pneumothorax. There is no significant right effusion. There is no acute osseous abnormality. Right axillary surgical clips are again noted. Subcutaneous emphysema in the right chest wall has essentially resolved. A right lower rib fracture is again noted. IMPRESSION: 1. Stable position of the right apical chest tube with no appreciable pneumothorax. 2. Unchanged bibasilar airspace opacities and probable small left pleural effusion. Electronically Signed   By: Lesia Hausen M.D.   On: 04/29/2022 08:26   DG CHEST PORT 1 VIEW  Result Date: 04/28/2022 CLINICAL DATA:  Rib  fractures, right chest tube, pneumothorax. EXAM: PORTABLE CHEST 1 VIEW COMPARISON:  Chest radiograph 1 day prior. FINDINGS: The right apical chest tube is stable in position. Median sternotomy wires are again noted. The cardiomediastinal silhouette is stable. There is unchanged consolidative opacity in the left base with a probable small left pleural effusion, unchanged. Hazy opacities in the right base are unchanged. There is no significant right effusion. There is no appreciable pneumothorax. Subcutaneous emphysema in the right chest wall is again seen, decreased in the interim. A mildly displaced fracture of an inferior right lateral rib is again noted. The other known rib fractures are not well seen on the current study. IMPRESSION: 1. Stable right apical chest tube with no appreciable pneumothorax. 2. Bibasilar airspace opacities and probable small left pleural effusion are unchanged. Electronically Signed   By: Lesia Hausen M.D.   On: 04/28/2022 08:49   DG Chest Port 1 View  Result Date: 04/27/2022 CLINICAL DATA:  Respiratory failure, pneumothorax EXAM: PORTABLE CHEST 1 VIEW COMPARISON:  04/27/2022 FINDINGS: Right apical pigtail chest tube is been placed and right pneumothorax has been evacuated. Bibasilar atelectasis or infiltrate persists. No pleural effusion. Median sternotomy has been performed. Cardiac size within normal limits. Known right rib fractures are not well appreciated on this examination. Subcutaneous gas noted within the right chest wall. Surgical clips again noted within the right axilla. IMPRESSION: 1. Interval evacuation of right pneumothorax status post right apical chest tube placement. 2. Stable bibasilar atelectasis or infiltrate. Electronically Signed   By: Helyn Numbers M.D.   On: 04/27/2022 16:20    Anti-infectives: Anti-infectives (From admission, onward)    None        Assessment/Plan  77 y/o F s/p fall from 8-10 feet without LOC T11 vertebral body FX - NS c/s,  Dr. Yetta Barre. Non-op mgmt. Plan upright films with no visible FX. Non-op mgmt with TLSO L1-3 TP FX  Right anterior PTX - gradual increase in size of PTX >> chest tube placed 4/15. No PTX on AM CXR. Place to [facility]. OP- 180 SS R Posterior Rib FX 11-12 - multimodal pain control  L hand abrasion - no underlying FX, chronic OA  ABL anemia - S/p 1 u pRBC 4/14 for hgb 7.5. hgh improving. 9.2 yesterday AKI - creatinine 1.86 > 1.56 > 1.19. improving with gently IVF and holding nephrotoxic agents. Diabetes mellitus - SSI CAD, PAD - cardiologist Dr. Elease Hashimoto, hold DAPT GERD   FEN - Reg, SLIV VTE - SCD's, received first dose of lovenox at midnight - recheck CBC in AM. ID - given ancef and tdap in ED 4/12 Admit - trauma, chest tube to WS, BMP and CBC in AM PT/OT  LOS: 4 days   I reviewed nursing notes, ED provider notes, Consultant NS notes, last 24 h vitals and pain scores, last 48 h intake and output, last 24 h labs and trends, and last 24 h imaging results.  This care required moderate level of medical decision making.   Hosie Spangle, PA-C [facility] Please see Amion for pager number during day hours 7:00am-4:30pm

## 2022-04-29 NOTE — Progress Notes (Signed)
Pt placed back on 2L O2 while sitting in chair - SpO2 in upper 80%.  Encouraged pt to take longer and deeper breaths - pt shallow breathing.  Pt verbalized understanding and demonstrated.

## 2022-04-30 ENCOUNTER — Inpatient Hospital Stay (HOSPITAL_COMMUNITY): Payer: Medicare Other

## 2022-04-30 LAB — GLUCOSE, CAPILLARY
Glucose-Capillary: 140 mg/dL — ABNORMAL HIGH (ref 70–99)
Glucose-Capillary: 164 mg/dL — ABNORMAL HIGH (ref 70–99)
Glucose-Capillary: 221 mg/dL — ABNORMAL HIGH (ref 70–99)
Glucose-Capillary: 175 mg/dL — ABNORMAL HIGH (ref 70–99)

## 2022-04-30 LAB — CBC
HCT: 25.1 % — ABNORMAL LOW (ref 36.0–46.0)
Hemoglobin: 8.5 g/dL — ABNORMAL LOW (ref 12.0–15.0)
MCH: 30.1 pg (ref 26.0–34.0)
MCHC: 33.9 g/dL (ref 30.0–36.0)
MCV: 89 fL (ref 80.0–100.0)
Platelets: 251 10*3/uL (ref 150–400)
RBC: 2.82 MIL/uL — ABNORMAL LOW (ref 3.87–5.11)
RDW: 15.7 % — ABNORMAL HIGH (ref 11.5–15.5)
WBC: 10.5 10*3/uL (ref 4.0–10.5)
nRBC: 0.3 % — ABNORMAL HIGH (ref 0.0–0.2)

## 2022-04-30 LAB — BASIC METABOLIC PANEL
Anion gap: 8 (ref 5–15)
BUN: 17 mg/dL (ref 8–23)
CO2: 20 mmol/L — ABNORMAL LOW (ref 22–32)
Calcium: 8.1 mg/dL — ABNORMAL LOW (ref 8.9–10.3)
Chloride: 105 mmol/L (ref 98–111)
Creatinine, Ser: 1.03 mg/dL — ABNORMAL HIGH (ref 0.44–1.00)
GFR, Estimated: 57 mL/min — ABNORMAL LOW (ref >60)
Glucose, Bld: 160 mg/dL — ABNORMAL HIGH (ref 70–99)
Potassium: 4 mmol/L (ref 3.5–5.1)
Sodium: 133 mmol/L — ABNORMAL LOW (ref 135–145)

## 2022-04-30 MED ORDER — ENSURE ENLIVE PO LIQD
237.0000 mL | Freq: Three times a day (TID) | ORAL | Status: DC
  Administered 2022-04-30: 237 mL via ORAL

## 2022-04-30 NOTE — Progress Notes (Signed)
Inpatient Rehab Coordinator Note:  I met with patient at bedside to discuss CIR recommendations and goals/expectations of CIR stay.  We reviewed 3 hrs/day of therapy, physician follow up, and average length of stay 2 weeks (dependent upon progress) with goals of modified independence.  She reports she is retired and her spouse is home with her during the day, if needed.  We reviewed benefits with Medicare/[employer] secondary.  Note plans to d/c chest tube today.  Will follow for potential admission in the next few days pending stability and bed availability.  Will call spouse to update per her request.   Estill Dooms, PT, DPT Admissions Coordinator [phone] 04/30/22  1:58 PM 0

## 2022-04-30 NOTE — Progress Notes (Signed)
[facility] Progress Note     Subjective: CC:  Reports low back pain and trouble adjusting herself in bed/moving independent. SOB improved. Tolerating PO but not eating much. +flatus   Objective: Vital signs in last 24 hours: Temp:  [98 F (36.7 C)-98.5 F (36.9 C)] 98 F (36.7 C) (04/18 0329) Pulse Rate:  [62-80] 62 (04/18 0329) Resp:  [18-20] 18 (04/18 0329) BP: (118-148)/(53-75) 127/75 (04/18 0329) SpO2:  [90 %-97 %] 96 % (04/18 0329) Last BM Date : 04/24/22  Intake/Output from previous day: 04/17 0701 - 04/18 0700 In: 570 [P.O.:480; I.V.:90] Out: 930 [Urine:900; Chest Tube:30] Intake/Output this shift: No intake/output data recorded.  PE: Gen:  Alert, cooperative  Card:  Regular rate and rhythm, pedal pulses 2+ BL Pulm:  Normal effort on 1.5 L [place], CTAB decreased lung sounds at  bases and no wheezing or crackles   R chest tube with SS fluid in cannister (30 mL/24h), on water seal Abd: Soft, non-tender, non-distended Skin: warm and dry, no rashes  Flank hematomas stable. Psych: A&Ox3   Lab Results:  Recent Labs    04/28/22 0852 04/30/22 0104  WBC 8.5 10.5  HGB 9.2* 8.5*  HCT 27.1* 25.1*  PLT 199 251   BMET Recent Labs    04/28/22 0852 04/30/22 0104  NA 134* 133*  K 4.0 4.0  CL 108 105  CO2 19* 20*  GLUCOSE 156* 160*  BUN 23 17  CREATININE 1.19* 1.03*  CALCIUM 7.7* 8.1*   PT/INR No results for input(s): "LABPROT", "INR" in the last 72 hours.  CMP     Component Value Date/Time   NA 133 (L) 04/30/2022 0104   NA 137 07/22/2016 1018   NA 140 01/23/2016 1323   K 4.0 04/30/2022 0104   K 4.2 01/23/2016 1323   CL 105 04/30/2022 0104   CL 107 04/21/2012 0941   CO2 20 (L) 04/30/2022 0104   CO2 26 01/23/2016 1323   GLUCOSE 160 (H) 04/30/2022 0104   GLUCOSE 113 01/23/2016 1323   GLUCOSE 121 (H) 04/21/2012 0941   BUN 17 04/30/2022 0104   BUN 18 07/22/2016 1018   BUN 19.4 01/23/2016 1323   CREATININE 1.03 (H) 04/30/2022 0104    CREATININE 1.1 01/23/2016 1323   CALCIUM 8.1 (L) 04/30/2022 0104   CALCIUM 10.0 01/23/2016 1323   PROT 7.3 04/24/2022 1608   PROT 7.3 01/27/2016 1034   PROT 7.5 01/23/2016 1323   ALBUMIN 4.0 04/24/2022 1608   ALBUMIN 4.6 01/27/2016 1034   ALBUMIN 4.2 01/23/2016 1323   AST 876 (H) 04/24/2022 1608   AST 21 01/23/2016 1323   ALT 738 (H) 04/24/2022 1608   ALT 15 01/23/2016 1323   ALKPHOS 58 04/24/2022 1608   ALKPHOS 57 01/23/2016 1323   BILITOT 1.0 04/24/2022 1608   BILITOT 0.4 01/27/2016 1034   BILITOT 0.60 01/23/2016 1323   GFRNONAA 57 (L) 04/30/2022 0104   GFRAA 46 (L) 02/22/2017 2032   Lipase     Component Value Date/Time   LIPASE 61 (H) 02/22/2017 2032       Studies/Results: DG CHEST PORT 1 VIEW  Result Date: 04/29/2022 CLINICAL DATA:  Right chest tube in place, pneumothorax. EXAM: PORTABLE CHEST 1 VIEW COMPARISON:  Chest radiograph 1 day prior FINDINGS: The right apical chest tube is stable. Median sternotomy wires are again noted. The cardiomediastinal silhouette is stable. Consolidative retrocardiac opacity and probable small left pleural effusion are unchanged. Patchy opacities in the right base are not significantly changed.  There is no appreciable pneumothorax. There is no significant right effusion. There is no acute osseous abnormality. Right axillary surgical clips are again noted. Subcutaneous emphysema in the right chest wall has essentially resolved. A right lower rib fracture is again noted. IMPRESSION: 1. Stable position of the right apical chest tube with no appreciable pneumothorax. 2. Unchanged bibasilar airspace opacities and probable small left pleural effusion. Electronically Signed   By: Lesia Hausen M.D.   On: 04/29/2022 08:26    Anti-infectives: Anti-infectives (From admission, onward)    None        Assessment/Plan  77 y/o F s/p fall from 8-10 feet without LOC T11 vertebral body FX - NS c/s, Dr. Yetta Barre. Non-op mgmt. Plan upright films with no  visible FX. Non-op mgmt with TLSO L1-3 TP FX  Right anterior PTX - gradual increase in size of PTX >> chest tube placed 4/15. No PTX on AM CXR. Place to [facility]. OP- 180 SS R Posterior Rib FX 11-12 - multimodal pain control  L hand abrasion - no underlying FX, chronic OA  ABL anemia - S/p 1 u pRBC 4/14 for hgb 7.5. hgb stabilizing. 8.5 today fron 9.2 yesterday AKI - creatinine 1.86 > 1.56 > 1.19 > 1.03 improved with gentle IVF and holding nephrotoxic agents. Diabetes mellitus - SSI CAD, PAD - cardiologist Dr. Elease Hashimoto, hold DAPT, hgb stabilizing so will discuss timing of resumption with MD. GERD   FEN - Reg, SLIV, add ensure VTE - SCD's, received first dose of lovenox 4/17 PM ID - given ancef and tdap in ED 4/12 Admit - trauma, D/C chest tube, wean O2, PT/OT recommending CIR - consult pending. Add ensure.  PT/OT     LOS: 5 days   I reviewed nursing notes, ED provider notes, Consultant NS notes, last 24 h vitals and pain scores, last 48 h intake and output, last 24 h labs and trends, and last 24 h imaging results.  This care required moderate level of medical decision making.   Hosie Spangle, PA-C [facility] Please see Amion for pager number during day hours 7:00am-4:30pm

## 2022-04-30 NOTE — Progress Notes (Signed)
Occupational Therapy Treatment Patient Details Name: EVANS COTA MRN: [id number] DOB: [birth year] Today's Date: 04/30/2022   History of present illness Pt is 77 year old female admitted 4/12 after falling through a deck. She has suffered right-sided rib fxs and fx at T11, L1-3 TP FX and a soft tissue injury to the right flank. Right anterior PTX - gradual increase in size of pneumothorax, chest tube placed 4/15. No PTX on AM CXR, continue to -20cm suction 4/16, plan CXR 4/17. Pt has history of L breast cancer s/p mastectomy, removal of lymph nodes in RUE ~4 mos prior, hypertension, PAD, Pulmonary emobolism, and DMII.   OT comments  Pt continuing to progress towards patient focused goals. Pt continues to be limited by pain with functional mobility and demonstrates a decreased recall of log rolling techniques for bed mobility. Once OOB patient ambulates with min guard assist. OT to continue to progress patient as able. HR up to 106 with ambulation, DC plans remain appropriate for [facility] at this time.   Recommendations for follow up therapy are one component of a multi-disciplinary discharge planning process, led by the attending physician.  Recommendations may be updated based on patient status, additional functional criteria and insurance authorization.    Assistance Recommended at Discharge Frequent or constant Supervision/Assistance  Patient can return home with the following  A little help with walking and/or transfers;A little help with bathing/dressing/bathroom;Assistance with cooking/housework;Assist for transportation;Help with stairs or ramp for entrance   Equipment Recommendations  Other (comment) (RW)    Recommendations for Other Services      Precautions / Restrictions Precautions Precautions: Fall;Back Precaution Booklet Issued: Yes (comment) (Per PT) Precaution Comments: Pt with decreased recall of log rolling Required Braces or Orthoses: Spinal Brace Spinal Brace:  Thoracolumbosacral orthotic;Applied in sitting position Restrictions Weight Bearing Restrictions: No       Mobility Bed Mobility Overal bed mobility: Needs Assistance Bed Mobility: Supine to Sit   Sidelying to sit: +2 for physical assistance, Mod assist       General bed mobility comments: Pt left sitting in recliner    Transfers Overall transfer level: Needs assistance Equipment used: Rolling walker (2 wheels) Transfers: Sit to/from Stand Sit to Stand: Min assist                 Balance Overall balance assessment: Needs assistance Sitting-balance support: No upper extremity supported, Feet supported Sitting balance-Leahy Scale: Fair     Standing balance support: Bilateral upper extremity supported, Reliant on assistive device for balance, During functional activity Standing balance-Leahy Scale: Poor Standing balance comment: RW reliant                           ADL either performed or assessed with clinical judgement   ADL Overall ADL's : Needs assistance/impaired     Grooming: Sitting;Supervision/safety;Set up;Wash/dry face                               Functional mobility during ADLs: Min guard;Rolling walker (2 wheels)      Extremity/Trunk Assessment              Vision       Perception     Praxis      Cognition Arousal/Alertness: Awake/alert Behavior During Therapy: WFL for tasks assessed/performed Overall Cognitive Status: No family/caregiver present to determine baseline cognitive functioning  General Comments: Poor recall of log rolling for precautions        Exercises      Shoulder Instructions       General Comments VSS on 2L    Pertinent Vitals/ Pain       Pain Assessment Pain Assessment: 0-10 Pain Location: Sternum and back with moving to EOB Pain Descriptors / Indicators: Aching, Guarding, Grimacing Pain Intervention(s): Limited activity within  patient's tolerance, Monitored during session  Home Living                                          Prior Functioning/Environment              Frequency  Min 2X/week        Progress Toward Goals  OT Goals(current goals can now be found in the care plan section)  Progress towards OT goals: Progressing toward goals  Acute Rehab OT Goals Patient Stated Goal: to reduce pain levels and return home OT Goal Formulation: With patient Time For Goal Achievement: 05/09/22  Plan Discharge plan remains appropriate;Frequency remains appropriate    Co-evaluation                 AM-PAC OT "6 Clicks" Daily Activity     Outcome Measure   Help from another person eating meals?: None Help from another person taking care of personal grooming?: A Little Help from another person toileting, which includes using toliet, bedpan, or urinal?: A Little Help from another person bathing (including washing, rinsing, drying)?: A Lot Help from another person to put on and taking off regular upper body clothing?: A Little Help from another person to put on and taking off regular lower body clothing?: A Lot 6 Click Score: 17    End of Session Equipment Utilized During Treatment: Rolling walker (2 wheels);Gait belt;Back brace  OT Visit Diagnosis: Unsteadiness on feet (R26.81);Muscle weakness (generalized) (M62.81);Pain Pain - part of body:  (back and sternum)   Activity Tolerance Patient tolerated treatment well   Patient Left in chair;with call bell/phone within reach;with chair alarm set   Nurse Communication Mobility status        Time: 2952-8413 OT Time Calculation (min): 18 min  Charges: OT General Charges $OT Visit: 1 Visit OT Treatments $Therapeutic Activity: 8-22 mins  04/30/2022  AB, OTR/L  Acute Rehabilitation Services  Office: [phone]   Tristan Schroeder 04/30/2022, 1:11 PM

## 2022-05-01 ENCOUNTER — Other Ambulatory Visit: Payer: Self-pay

## 2022-05-01 ENCOUNTER — Encounter (HOSPITAL_COMMUNITY): Payer: Self-pay | Admitting: Physical Medicine and Rehabilitation

## 2022-05-01 ENCOUNTER — Inpatient Hospital Stay (HOSPITAL_COMMUNITY)
Admission: RE | Admit: 2022-05-01 | Discharge: 2022-05-07 | DRG: 560 | Disposition: A | Discharge status: Home Health | Payer: Medicare Other | Source: Intra-hospital | Attending: Physical Medicine and Rehabilitation | Admitting: Physical Medicine and Rehabilitation

## 2022-05-01 DIAGNOSIS — K5901 Slow transit constipation: Secondary | ICD-10-CM

## 2022-05-01 DIAGNOSIS — E871 Hypo-osmolality and hyponatremia: Secondary | ICD-10-CM | POA: Diagnosis present

## 2022-05-01 DIAGNOSIS — S2241XA Multiple fractures of ribs, right side, initial encounter for closed fracture: Secondary | ICD-10-CM

## 2022-05-01 DIAGNOSIS — T07XXXA Unspecified multiple injuries, initial encounter: Secondary | ICD-10-CM

## 2022-05-01 DIAGNOSIS — S270XXD Traumatic pneumothorax, subsequent encounter: Secondary | ICD-10-CM

## 2022-05-01 DIAGNOSIS — E1122 Type 2 diabetes mellitus with diabetic chronic kidney disease: Secondary | ICD-10-CM | POA: Diagnosis present

## 2022-05-01 DIAGNOSIS — E1165 Type 2 diabetes mellitus with hyperglycemia: Secondary | ICD-10-CM | POA: Diagnosis present

## 2022-05-01 DIAGNOSIS — F419 Anxiety disorder, unspecified: Secondary | ICD-10-CM | POA: Diagnosis present

## 2022-05-01 DIAGNOSIS — E1151 Type 2 diabetes mellitus with diabetic peripheral angiopathy without gangrene: Secondary | ICD-10-CM | POA: Diagnosis present

## 2022-05-01 DIAGNOSIS — G4733 Obstructive sleep apnea (adult) (pediatric): Secondary | ICD-10-CM | POA: Diagnosis present

## 2022-05-01 DIAGNOSIS — T1490XA Injury, unspecified, initial encounter: Secondary | ICD-10-CM | POA: Diagnosis not present

## 2022-05-01 DIAGNOSIS — S2241XD Multiple fractures of ribs, right side, subsequent encounter for fracture with routine healing: Secondary | ICD-10-CM | POA: Diagnosis not present

## 2022-05-01 DIAGNOSIS — N179 Acute kidney failure, unspecified: Secondary | ICD-10-CM | POA: Diagnosis present

## 2022-05-01 DIAGNOSIS — Z853 Personal history of malignant neoplasm of breast: Secondary | ICD-10-CM

## 2022-05-01 DIAGNOSIS — I129 Hypertensive chronic kidney disease with stage 1 through stage 4 chronic kidney disease, or unspecified chronic kidney disease: Secondary | ICD-10-CM | POA: Diagnosis present

## 2022-05-01 DIAGNOSIS — Z955 Presence of coronary angioplasty implant and graft: Secondary | ICD-10-CM

## 2022-05-01 DIAGNOSIS — E039 Hypothyroidism, unspecified: Secondary | ICD-10-CM | POA: Diagnosis present

## 2022-05-01 DIAGNOSIS — Z9012 Acquired absence of left breast and nipple: Secondary | ICD-10-CM | POA: Diagnosis not present

## 2022-05-01 DIAGNOSIS — Z951 Presence of aortocoronary bypass graft: Secondary | ICD-10-CM | POA: Diagnosis not present

## 2022-05-01 DIAGNOSIS — Z79899 Other long term (current) drug therapy: Secondary | ICD-10-CM

## 2022-05-01 DIAGNOSIS — Z7902 Long term (current) use of antithrombotics/antiplatelets: Secondary | ICD-10-CM

## 2022-05-01 DIAGNOSIS — I2510 Atherosclerotic heart disease of native coronary artery without angina pectoris: Secondary | ICD-10-CM | POA: Diagnosis present

## 2022-05-01 DIAGNOSIS — Z85820 Personal history of malignant melanoma of skin: Secondary | ICD-10-CM

## 2022-05-01 DIAGNOSIS — Z886 Allergy status to analgesic agent status: Secondary | ICD-10-CM

## 2022-05-01 DIAGNOSIS — F32A Depression, unspecified: Secondary | ICD-10-CM | POA: Diagnosis present

## 2022-05-01 DIAGNOSIS — E872 Acidosis, unspecified: Secondary | ICD-10-CM | POA: Diagnosis present

## 2022-05-01 DIAGNOSIS — S270XXA Traumatic pneumothorax, initial encounter: Secondary | ICD-10-CM

## 2022-05-01 DIAGNOSIS — M47814 Spondylosis without myelopathy or radiculopathy, thoracic region: Secondary | ICD-10-CM | POA: Diagnosis present

## 2022-05-01 DIAGNOSIS — Z79890 Hormone replacement therapy (postmenopausal): Secondary | ICD-10-CM | POA: Diagnosis not present

## 2022-05-01 DIAGNOSIS — S22089D Unspecified fracture of T11-T12 vertebra, subsequent encounter for fracture with routine healing: Principal | ICD-10-CM

## 2022-05-01 DIAGNOSIS — E1140 Type 2 diabetes mellitus with diabetic neuropathy, unspecified: Secondary | ICD-10-CM | POA: Diagnosis present

## 2022-05-01 DIAGNOSIS — R7401 Elevation of levels of liver transaminase levels: Secondary | ICD-10-CM | POA: Diagnosis not present

## 2022-05-01 DIAGNOSIS — E119 Type 2 diabetes mellitus without complications: Secondary | ICD-10-CM | POA: Diagnosis not present

## 2022-05-01 DIAGNOSIS — Z888 Allergy status to other drugs, medicaments and biological substances status: Secondary | ICD-10-CM

## 2022-05-01 DIAGNOSIS — E7800 Pure hypercholesterolemia, unspecified: Secondary | ICD-10-CM | POA: Diagnosis present

## 2022-05-01 DIAGNOSIS — G4700 Insomnia, unspecified: Secondary | ICD-10-CM | POA: Diagnosis present

## 2022-05-01 DIAGNOSIS — M4316 Spondylolisthesis, lumbar region: Secondary | ICD-10-CM | POA: Diagnosis present

## 2022-05-01 DIAGNOSIS — Z832 Family history of diseases of the blood and blood-forming organs and certain disorders involving the immune mechanism: Secondary | ICD-10-CM

## 2022-05-01 DIAGNOSIS — S32009D Unspecified fracture of unspecified lumbar vertebra, subsequent encounter for fracture with routine healing: Secondary | ICD-10-CM

## 2022-05-01 DIAGNOSIS — Z87891 Personal history of nicotine dependence: Secondary | ICD-10-CM

## 2022-05-01 DIAGNOSIS — N189 Chronic kidney disease, unspecified: Secondary | ICD-10-CM | POA: Diagnosis present

## 2022-05-01 DIAGNOSIS — Z7984 Long term (current) use of oral hypoglycemic drugs: Secondary | ICD-10-CM

## 2022-05-01 DIAGNOSIS — D62 Acute posthemorrhagic anemia: Secondary | ICD-10-CM

## 2022-05-01 DIAGNOSIS — Z6829 Body mass index (BMI) 29.0-29.9, adult: Secondary | ICD-10-CM

## 2022-05-01 DIAGNOSIS — Z7982 Long term (current) use of aspirin: Secondary | ICD-10-CM

## 2022-05-01 DIAGNOSIS — S2249XA Multiple fractures of ribs, unspecified side, initial encounter for closed fracture: Secondary | ICD-10-CM | POA: Diagnosis present

## 2022-05-01 DIAGNOSIS — Z86711 Personal history of pulmonary embolism: Secondary | ICD-10-CM

## 2022-05-01 DIAGNOSIS — Z881 Allergy status to other antibiotic agents status: Secondary | ICD-10-CM

## 2022-05-01 DIAGNOSIS — Z809 Family history of malignant neoplasm, unspecified: Secondary | ICD-10-CM

## 2022-05-01 DIAGNOSIS — Z8249 Family history of ischemic heart disease and other diseases of the circulatory system: Secondary | ICD-10-CM

## 2022-05-01 DIAGNOSIS — Z880 Allergy status to penicillin: Secondary | ICD-10-CM

## 2022-05-01 DIAGNOSIS — K0889 Other specified disorders of teeth and supporting structures: Secondary | ICD-10-CM | POA: Diagnosis present

## 2022-05-01 DIAGNOSIS — E663 Overweight: Secondary | ICD-10-CM | POA: Diagnosis present

## 2022-05-01 DIAGNOSIS — I10 Essential (primary) hypertension: Secondary | ICD-10-CM | POA: Diagnosis present

## 2022-05-01 LAB — GLUCOSE, CAPILLARY
Glucose-Capillary: 159 mg/dL — ABNORMAL HIGH (ref 70–99)
Glucose-Capillary: 156 mg/dL — ABNORMAL HIGH (ref 70–99)
Glucose-Capillary: 194 mg/dL — ABNORMAL HIGH (ref 70–99)
Glucose-Capillary: 158 mg/dL — ABNORMAL HIGH (ref 70–99)

## 2022-05-01 MED ORDER — LEVOTHYROXINE SODIUM 75 MCG PO TABS
75.0000 ug | ORAL_TABLET | Freq: Every day | ORAL | Status: DC
  Administered 2022-05-02 – 2022-05-07 (×6): 75 ug via ORAL
  Filled 2022-05-01 (×6): qty 1

## 2022-05-01 MED ORDER — CLOPIDOGREL BISULFATE 75 MG PO TABS
75.0000 mg | ORAL_TABLET | Freq: Every day | ORAL | Status: DC
  Administered 2022-05-02 – 2022-05-07 (×6): 75 mg via ORAL
  Filled 2022-05-01 (×6): qty 1

## 2022-05-01 MED ORDER — ALUM & MAG HYDROXIDE-SIMETH 200-200-20 MG/5ML PO SUSP: 30.0000 mL | ORAL | Status: DC | PRN

## 2022-05-01 MED ORDER — LIDOCAINE 5 % EX PTCH
1.0000 | MEDICATED_PATCH | CUTANEOUS | Status: DC
  Administered 2022-05-02 – 2022-05-06 (×3): 1 via TRANSDERMAL
  Filled 2022-05-01 (×6): qty 1

## 2022-05-01 MED ORDER — TRAMADOL HCL 50 MG PO TABS
50.0000 mg | ORAL_TABLET | Freq: Three times a day (TID) | ORAL | Status: DC
  Administered 2022-05-01 – 2022-05-07 (×23): 50 mg via ORAL
  Filled 2022-05-01 (×23): qty 1

## 2022-05-01 MED ORDER — ROSUVASTATIN CALCIUM 20 MG PO TABS
20.0000 mg | ORAL_TABLET | Freq: Every day | ORAL | Status: DC
  Administered 2022-05-02 – 2022-05-07 (×6): 20 mg via ORAL
  Filled 2022-05-01 (×6): qty 1

## 2022-05-01 MED ORDER — ASPIRIN 81 MG PO CHEW
81.0000 mg | CHEWABLE_TABLET | Freq: Every day | ORAL | Status: DC
  Administered 2022-05-01: 81 mg via ORAL
  Filled 2022-05-01: qty 1

## 2022-05-01 MED ORDER — FLEET ENEMA 7-19 GM/118ML RE ENEM: 1.0000 | ENEMA | Freq: Once | RECTAL | Status: DC | PRN

## 2022-05-01 MED ORDER — POLYETHYLENE GLYCOL 3350 17 G PO PACK: 17.0000 g | PACK | Freq: Every day | ORAL | Status: DC | PRN

## 2022-05-01 MED ORDER — DIPHENHYDRAMINE HCL 12.5 MG/5ML PO ELIX: 12.5000 mg | ORAL_SOLUTION | Freq: Four times a day (QID) | ORAL | Status: DC | PRN

## 2022-05-01 MED ORDER — ENOXAPARIN SODIUM 40 MG/0.4ML IJ SOSY
40.0000 mg | PREFILLED_SYRINGE | INTRAMUSCULAR | Status: DC
  Administered 2022-05-02 – 2022-05-07 (×6): 40 mg via SUBCUTANEOUS
  Filled 2022-05-01 (×6): qty 0.4

## 2022-05-01 MED ORDER — SERTRALINE HCL 100 MG PO TABS
100.0000 mg | ORAL_TABLET | Freq: Every day | ORAL | Status: DC
  Administered 2022-05-02 – 2022-05-07 (×6): 100 mg via ORAL
  Filled 2022-05-01 (×6): qty 1

## 2022-05-01 MED ORDER — TIMOLOL MALEATE 0.25 % OP SOLN
1.0000 [drp] | Freq: Two times a day (BID) | OPHTHALMIC | Status: DC
  Administered 2022-05-02 – 2022-05-07 (×11): 1 [drp] via OPHTHALMIC
  Filled 2022-05-01 (×2): qty 5

## 2022-05-01 MED ORDER — CLOPIDOGREL BISULFATE 75 MG PO TABS
75.0000 mg | ORAL_TABLET | Freq: Every day | ORAL | Status: DC
  Administered 2022-05-01: 75 mg via ORAL
  Filled 2022-05-01: qty 1

## 2022-05-01 MED ORDER — ALPRAZOLAM 0.25 MG PO TABS
0.2500 mg | ORAL_TABLET | Freq: Every day | ORAL | Status: DC | PRN
  Administered 2022-05-04: 0.25 mg via ORAL
  Filled 2022-05-01: qty 1

## 2022-05-01 MED ORDER — PROCHLORPERAZINE 25 MG RE SUPP: 12.5000 mg | Freq: Four times a day (QID) | RECTAL | Status: DC | PRN

## 2022-05-01 MED ORDER — POLYETHYLENE GLYCOL 3350 17 G PO PACK
17.0000 g | PACK | Freq: Every day | ORAL | Status: DC
  Administered 2022-05-02 – 2022-05-06 (×5): 17 g via ORAL
  Filled 2022-05-01 (×6): qty 1

## 2022-05-01 MED ORDER — OXYCODONE HCL 5 MG PO TABS
5.0000 mg | ORAL_TABLET | ORAL | Status: DC | PRN
  Administered 2022-05-02 – 2022-05-05 (×4): 5 mg via ORAL
  Filled 2022-05-01 (×4): qty 1

## 2022-05-01 MED ORDER — TIMOLOL MALEATE 0.25 % OP SOLN
1.0000 [drp] | Freq: Two times a day (BID) | OPHTHALMIC | Status: DC
  Administered 2022-05-01: 1 [drp] via OPHTHALMIC
  Filled 2022-05-01: qty 5

## 2022-05-01 MED ORDER — BISACODYL 10 MG RE SUPP: 10.0000 mg | Freq: Every day | RECTAL | Status: DC | PRN

## 2022-05-01 MED ORDER — ASPIRIN 81 MG PO CHEW
81.0000 mg | CHEWABLE_TABLET | Freq: Every day | ORAL | Status: DC
  Administered 2022-05-02 – 2022-05-07 (×6): 81 mg via ORAL
  Filled 2022-05-01 (×6): qty 1

## 2022-05-01 MED ORDER — ENSURE MAX PROTEIN PO LIQD
11.0000 [oz_av] | Freq: Two times a day (BID) | ORAL | Status: DC
  Administered 2022-05-01 – 2022-05-06 (×8): 11 [oz_av] via ORAL

## 2022-05-01 MED ORDER — INSULIN ASPART 100 UNIT/ML IJ SOLN
0.0000 [IU] | Freq: Every day | INTRAMUSCULAR | Status: DC
  Administered 2022-05-02 – 2022-05-06 (×2): 2 [IU] via SUBCUTANEOUS

## 2022-05-01 MED ORDER — INSULIN ASPART 100 UNIT/ML IJ SOLN
0.0000 [IU] | Freq: Three times a day (TID) | INTRAMUSCULAR | Status: DC
  Administered 2022-05-01 – 2022-05-02 (×3): 3 [IU] via SUBCUTANEOUS
  Administered 2022-05-02 – 2022-05-03 (×3): 2 [IU] via SUBCUTANEOUS
  Administered 2022-05-04: 3 [IU] via SUBCUTANEOUS
  Administered 2022-05-04: 2 [IU] via SUBCUTANEOUS
  Administered 2022-05-05: 3 [IU] via SUBCUTANEOUS
  Administered 2022-05-05 – 2022-05-07 (×3): 2 [IU] via SUBCUTANEOUS

## 2022-05-01 MED ORDER — DOCUSATE SODIUM 100 MG PO CAPS
100.0000 mg | ORAL_CAPSULE | Freq: Two times a day (BID) | ORAL | Status: DC
  Administered 2022-05-01 – 2022-05-07 (×12): 100 mg via ORAL
  Filled 2022-05-01 (×12): qty 1

## 2022-05-01 MED ORDER — OXYCODONE HCL 5 MG PO TABS: 2.5000 mg | ORAL_TABLET | Freq: Four times a day (QID) | ORAL | Status: DC | PRN

## 2022-05-01 MED ORDER — ISOSORBIDE MONONITRATE ER 30 MG PO TB24
60.0000 mg | ORAL_TABLET | Freq: Every day | ORAL | Status: DC
  Administered 2022-05-02 – 2022-05-07 (×6): 60 mg via ORAL
  Filled 2022-05-01 (×6): qty 2

## 2022-05-01 MED ORDER — PROCHLORPERAZINE EDISYLATE 10 MG/2ML IJ SOLN: 5.0000 mg | Freq: Four times a day (QID) | INTRAMUSCULAR | Status: DC | PRN

## 2022-05-01 MED ORDER — METHOCARBAMOL 500 MG PO TABS
1000.0000 mg | ORAL_TABLET | Freq: Three times a day (TID) | ORAL | Status: DC
  Administered 2022-05-01 – 2022-05-07 (×17): 1000 mg via ORAL
  Filled 2022-05-01 (×17): qty 2

## 2022-05-01 MED ORDER — GUAIFENESIN-DM 100-10 MG/5ML PO SYRP: 5.0000 mL | ORAL_SOLUTION | Freq: Four times a day (QID) | ORAL | Status: DC | PRN

## 2022-05-01 MED ORDER — PROCHLORPERAZINE MALEATE 5 MG PO TABS: 5.0000 mg | ORAL_TABLET | Freq: Four times a day (QID) | ORAL | Status: DC | PRN

## 2022-05-01 NOTE — Progress Notes (Signed)
Mobility Specialist: Progress Note   05/01/22 1110  Mobility  Activity Ambulated with assistance in room  Level of Assistance Moderate assist, patient does 50-74%  Assistive Device Front wheel walker  Distance Ambulated (ft) 50 ft  Activity Response Tolerated well  Mobility Referral Yes  $Mobility charge 1 Mobility   Pre-Mobility: 67 HR, 94% SpO2 Post-Mobility: 76 HR, 156/78 (102) BP, 92% SpO2  Pt received in the bed and agreeable to mobility. ModA with bed mobility as well as to stand. C/o 4/10 back pain during ambulation as well as mild dizziness and SOB. Pt to the chair after session with call bell and phone in reach. Chair alarm is on.   Adryan Shin Mobility Specialist Please contact via SecureChat or Rehab office at [phone]

## 2022-05-01 NOTE — Progress Notes (Signed)
Inpatient Rehabilitation Admission Medication Review by a Pharmacist  A complete drug regimen review was completed for this patient to identify any potential clinically significant medication issues.  High Risk Drug Classes Is patient taking? Indication by Medication  Antipsychotic Yes Compazine - prn N/V  Anticoagulant Yes Lovenox - VTE prophylaxis   Antibiotic No   Opioid Yes Tramadol - pain Oxycodone - prn pain  Antiplatelet Yes Clopidogrel, aspirin - CAD, stent  Hypoglycemics/insulin Yes SSI - DM  Vasoactive Medication Yes Imdur - CAD  Chemotherapy No   Other Yes Levothyroxine - thyroid Lidocaine - pain patch Methocarbamol - muscle spasms Sertraline - mood Timolol - glaucoma Alprazolam - prn anxiety     Type of Medication Issue Identified Description of Issue Recommendation(s)  Drug Interaction(s) (clinically significant)     Duplicate Therapy     Allergy     No Medication Administration End Date     Incorrect Dose     Additional Drug Therapy Needed     Significant med changes from prior encounter (inform family/care partners about these prior to discharge).    Other       Clinically significant medication issues were identified that warrant physician communication and completion of prescribed/recommended actions by midnight of the next day:  No  Pharmacist comments: Glipizide 10 mg daily, Metformin 1000 mg BID PTA for DM (now on SSI), lisinopril not resumed  Time spent performing this drug regimen review (minutes):  20 minutes  Thank you Okey Regal, PharmD

## 2022-05-01 NOTE — PMR Pre-admission (Signed)
PMR Admission Coordinator Pre-Admission Assessment  Patient: Taylor [name] is an 77 y.o., female MRN: [id number] DOB: Dec 09, 1945 Height: 5' 7.5" (171.5 cm) Weight: 80 kg  Insurance Information HMO:     PPO:      PCP:      IPA:      80/20:      OTHER:  PRIMARY: Medicare A/B      Policy#: [id number]      Subscriber: pt CM Name:       Phone#:      Fax#:  Pre-Cert#: verified on passport one      Employer:  Benefits:  Phone #:      Name:  Eff. Date: 02/13/12 A/B     Deduct: $1632      Out of Pocket Max: n/a      Life Max: n/a CIR: 100%      SNF: 20 full days (20 remaining) Outpatient: 80%     Co-Pay: 20% Home Health: 100%      Co-Pay:  DME: 80%     Co-Pay: 20% Providers:  SECONDARY: UHC       Policy#: [id number]     Phone#: [phone]  Financial Counselor:       Phone#:   The "Data Collection Information Summary" for patients in Inpatient Rehabilitation Facilities with attached "Privacy Act Statement-Health Care Records" was provided and verbally reviewed with: Patient  Emergency Contact Information Contact Information     Name Relation Home Work Mobile   [place] Spouse [phone]  [phone]   Powell,Carolyn Sister   [phone]   Petra, Dumler   [phone]       Current Medical History  Patient Admitting Diagnosis: polytrauma  History of Present Illness: Pt is a 77 y/o female with PMH of L breast cancer s/p mastectomy ~4 months PTA, HTN, PAD, pulmonary embolism, and DM admitted on 04/24/22 after she fell through her son's deck.  In ED labs unremarkable, BP soft at 102/62, resolved with hydration.  Trauma workup revealed T11 vertebral body fracture, L1-3 TP fracture, R posterior rib fx 11-12, L hand abrasions.  Neurosurgery was consulted and recommended non op management with TLSO with chest plate when OOB.  Pt did develop R anterior PTX and required chest tube placement 04/27/22, which was d/c'd on 4/18 with f/u imaging showing resolved PTX.  Therapy ongoing and  pt was recommended for CIR.     Patient's medical record from [facility] has been reviewed by the rehabilitation admission coordinator and physician.  Past Medical History  Past Medical History:  Diagnosis Date   Cancer of left breast    Complication of anesthesia    Coronary artery disease    a. 2006 s/p CABG x 4 (LIMA->LAD, VG->Diag->OM, VG->RPDA);  b. 02/2015: [place] s/p DES to [facility], VG->PDA & VG->D1->OM 100; c. 06/2015 Cath: diffuse dzs->Med rx; d. 11/2015 Cath/PCI: LM nl, LAD 100ost, 95d, D1 90ost, LCX 35ost, 65p, OM2 40, lat OM2 90, RCA 95p (2.75x16 Synergy DES), patent mid stent, RPLB 70, LIMA->LAD ok.EF55-65%.   Depression    Hx of tobacco use, presenting hazards to health    a. quit 2006.   Hypercholesteremia    Hypertension    Hypertensive heart disease    Echo 04/2019: Inferior HK, EF 55, normal RV SF, RVSP normal at 29.2, mild BAE, trivial MR, mild aortic valve sclerosis without stenosis   Left carotid bruit    a. 09/2014 Carotid U/S: 1-39% bilat ICA stenosis.   PAD (peripheral artery disease)  a. 11/2014 ABI: R: 0.63, L 0.59;  b. 11/2014 Periph Angio: bilat pop occlusions. L - short w/ reconstituion via collats in dist pop w/ 2 vessel runoff, R long w/ reconstitution in prox tib/peroneal arteries-->med Rx w/ pletal.   Pulmonary embolism a. 2014.   Reflux esophagitis    Type II diabetes mellitus     Has the patient had major surgery during 100 days prior to admission? No  Family History   family history includes Cancer in her sister; Coronary artery disease (age of onset: 59) in her sister; Coronary artery disease (age of onset: 22) in her mother; Deep vein thrombosis in her cousin; Heart attack (age of onset: 58) in her father.  Current Medications  Current Facility-Administered Medications:    acetaminophen (TYLENOL) tablet 1,000 mg, 1,000 mg, Oral, Q6H, Lovick, [name], MD, 1,000 mg at 05/01/22 0513   ALPRAZolam (XANAX) tablet 0.25 mg, 0.25 mg, Oral, Daily PRN, Simaan,  Elizabeth S, PA-C   aspirin chewable tablet 81 mg, 81 mg, Oral, Daily, [name], PA-C, 81 mg at 05/01/22 4098   clopidogrel (PLAVIX) tablet 75 mg, 75 mg, Oral, Daily, Simaan, Elizabeth S, PA-C, 75 mg at 05/01/22 1191   docusate sodium (COLACE) capsule 100 mg, 100 mg, Oral, BID, [name], MD, 100 mg at 05/01/22 0935   enoxaparin (LOVENOX) injection 30 mg, 30 mg, Subcutaneous, Q12H, Simaan, Elizabeth S, PA-C, 30 mg at 04/30/22 2335   feeding supplement (ENSURE ENLIVE / ENSURE PLUS) liquid 237 mL, 237 mL, Oral, TID WC, Simaan, Elizabeth S, PA-C, 237 mL at 04/30/22 1325   insulin aspart (novoLOG) injection 0-15 Units, 0-15 Units, Subcutaneous, TID WC, Simaan, Elizabeth S, PA-C, 3 Units at 05/01/22 0716   insulin aspart (novoLOG) injection 0-5 Units, 0-5 Units, Subcutaneous, QHS, Simaan, Elizabeth S, PA-C   isosorbide mononitrate (IMDUR) 24 hr tablet 60 mg, 60 mg, Oral, Daily, Simaan, Elizabeth S, PA-C, 60 mg at 05/01/22 0935   levothyroxine (SYNTHROID) tablet 75 mcg, 75 mcg, Oral, Q0600, [name], PA-C, 75 mcg at 05/01/22 0513   lidocaine (LIDODERM) 5 % 1 patch, 1 patch, Transdermal, Q24H, Simaan, Elizabeth S, PA-C, 1 patch at 05/01/22 4782   methocarbamol (ROBAXIN) tablet 1,000 mg, 1,000 mg, Oral, Q8H, Lovick, [name], MD, 1,000 mg at 05/01/22 0513   morphine (PF) 2 MG/ML injection 1-2 mg, 1-2 mg, Intravenous, Q4H PRN, [name], MD, 2 mg at 04/30/22 1533   ondansetron (ZOFRAN-ODT) disintegrating tablet 4 mg, 4 mg, Oral, Q6H PRN **OR** ondansetron (ZOFRAN) injection 4 mg, 4 mg, Intravenous, Q6H PRN, [name], MD, 4 mg at 04/24/22 1738   oxyCODONE (Oxy IR/ROXICODONE) immediate release tablet 2.5 mg, 2.5 mg, Oral, Q6H PRN, Simaan, Elizabeth S, PA-C   oxyCODONE (Oxy IR/ROXICODONE) immediate release tablet 5 mg, 5 mg, Oral, Q4H PRN, Simaan, Elizabeth S, PA-C, 5 mg at 05/01/22 0253   polyethylene glycol (MIRALAX / GLYCOLAX) packet 17 g, 17 g, Oral, Daily, Simaan,  Elizabeth S, PA-C, 17 g at 05/01/22 0934   sertraline (ZOLOFT) tablet 100 mg, 100 mg, Oral, Daily, Simaan, Elizabeth S, PA-C, 100 mg at 05/01/22 0935   timolol (TIMOPTIC) 0.25 % ophthalmic solution 1-2 drop, 1-2 drop, Both Eyes, BID, Simaan, Elizabeth S, PA-C  Facility-Administered Medications Ordered in Other Encounters:    aminophylline injection 75 mg, 75 mg, Intravenous, BID PRN, Laurey Morale, MD, 75 mg at 08/08/14 1335  Patients Current Diet:  Diet Order  Diet Carb Modified Fluid consistency: Thin; Room service appropriate? Yes  Diet effective now                   Precautions / Restrictions Precautions Precautions: Fall, Back Precaution Booklet Issued: Yes (comment) (Per PT) Precaution Comments: Pt with decreased recall of log rolling Spinal Brace: Thoracolumbosacral orthotic, Applied in sitting position Restrictions Weight Bearing Restrictions: No   Has the patient had 2 or more falls or a fall with injury in the past year? Yes  Prior Activity Level Community (5-7x/wk): fully independent, no DME, driving, retired  Prior Functional Level Self Care: Did the patient need help bathing, dressing, using the toilet or eating? Independent  Indoor Mobility: Did the patient need assistance with walking from room to room (with or without device)? Independent  Stairs: Did the patient need assistance with internal or external stairs (with or without device)? Independent  Functional Cognition: Did the patient need help planning regular tasks such as shopping or remembering to take medications? Independent  Patient Information Are you of Hispanic, Latino/a,or Spanish origin?: A. No, not of Hispanic, Latino/a, or Spanish origin What is your race?: A. White Do you need or want an interpreter to communicate with a doctor or health care staff?: 0. No  Patient's Response To:  Health Literacy and Transportation Is the patient able to respond to health literacy and  transportation needs?: Yes Health Literacy - How often do you need to have someone help you when you read instructions, pamphlets, or other written material from your doctor or pharmacy?: Never In the past 12 months, has lack of transportation kept you from medical appointments or from getting medications?: No In the past 12 months, has lack of transportation kept you from meetings, work, or from getting things needed for daily living?: No  Journalist, newspaper / Equipment Home Assistive Devices/Equipment: None Home Equipment: Agricultural consultant (2 wheels), Wheelchair - manual, Shower seat  Prior Device Use: Indicate devices/aids used by the patient prior to current illness, exacerbation or injury? None of the above  Current Functional Level Cognition  Overall Cognitive Status: No family/caregiver present to determine baseline cognitive functioning Orientation Level: Oriented X4 General Comments: Poor recall of log rolling for precautions    Extremity Assessment (includes Sensation/Coordination)  Upper Extremity Assessment: Defer to OT evaluation RUE Deficits / Details: pain with shoulder ABD/flexion, slight decrease in ROM, still able to complete ADLs as needed RUE: Shoulder pain with ROM LUE Deficits / Details: pain with shoulder ABD/flexion, slight decrease in ROM, still able to complete ADLs as needed LUE: Shoulder pain with ROM  Lower Extremity Assessment: Generalized weakness (No focal deficits noted. Distal strength 5/5 with hallux ext, ankle DF/PF, knee flexion/ext.)    ADLs  Overall ADL's : Needs assistance/impaired Eating/Feeding: Set up Grooming: Sitting, Supervision/safety, Set up, Wash/dry face Upper Body Bathing: Minimal assistance Lower Body Bathing: Maximal assistance Upper Body Dressing : Minimal assistance Lower Body Dressing: Maximal assistance Toilet Transfer: Minimal assistance Toileting- Clothing Manipulation and Hygiene: Minimal assistance Tub/ Shower  Transfer: Minimal assistance Functional mobility during ADLs: Min guard, Rolling walker (2 wheels) General ADL Comments: Does well overall,    Mobility  Overal bed mobility: Needs Assistance Bed Mobility: Supine to Sit Rolling: Min assist Sidelying to sit: +2 for physical assistance, Mod assist Supine to sit: Mod assist, +2 for safety/equipment, HOB elevated Sit to sidelying: Mod assist General bed mobility comments: Pt left sitting in recliner    Transfers  Overall transfer level: Needs  assistance Equipment used: Rolling walker (2 wheels) Transfers: Sit to/from Stand Sit to Stand: Min assist Bed to/from chair/wheelchair/BSC transfer type:: Step pivot Step pivot transfers: From elevated surface, Min assist General transfer comment: From very elevated bed surface<>RW and RW<>BSC, initial verbal/tactile cues for safe UE placement within back precs; painful transition, but pt with fair eccentric control for stand>sit on BSC and on recliner chair (with pillows on seat). Pt with slightly improved foot clearance this date but still with small low steps and needs dense cues for posture, RW management and body mechanics.    Ambulation / Gait / Stairs / Wheelchair Mobility  Ambulation/Gait Ambulation/Gait assistance: Editor, commissioning (Feet): 6 Feet Assistive device: Rolling walker (2 wheels) Gait Pattern/deviations: Decreased stride length, Step-to pattern, Narrow base of support, Shuffle General Gait Details: Pt with slightly improved foot clearance this date but still with small low steps and needs dense cues for posture, RW management and body mechanics. Pt c/o increased severe pain after ~81ft so chair pulled up for pt to sit in, pt on 2L O2 [place] for gait task but weaned to RA per RN request once in recliner. HR 112-128 bpm with exertion and was 78 bpm resting. Pt placed on 2L O2 [place] for gait trial as she desat to SpO2 80-86% with pivot to BSC on RA and with shallow breaths. On 2L, SpO2  90% and greater but only ~34ft distance. Gait velocity: <0.2 m/s Gait velocity interpretation: <1.31 ft/sec, indicative of household ambulator    Posture / Balance Dynamic Sitting Balance Sitting balance - Comments: intermittent minA due to lateral lean with fatigue Balance Overall balance assessment: Needs assistance Sitting-balance support: No upper extremity supported, Feet supported Sitting balance-Leahy Scale: Fair Sitting balance - Comments: intermittent minA due to lateral lean with fatigue Standing balance support: Bilateral upper extremity supported, Reliant on assistive device for balance, During functional activity Standing balance-Leahy Scale: Poor Standing balance comment: RW reliant    Special needs/care consideration Oxygen 2L acutely and Diabetic management yes   Previous Home Environment (from acute therapy documentation) Living Arrangements: Spouse/significant other Available Help at Discharge: Family Type of Home: House Home Layout: One level Home Access: Stairs to enter Entrance Stairs-Rails: Left Entrance Stairs-Number of Steps: 1 Bathroom Shower/Tub: Tub/shower unit Home Care Services: No Additional Comments: Pt lives at home with husband, both independent, husband is able to assist as needed, retired.  Discharge Living Setting Plans for Discharge Living Setting: Patient's home, Lives with (comment) (spouse, [name]) Type of Home at Discharge: House Discharge Home Layout: One level Discharge Home Access: Stairs to enter Entrance Stairs-Rails: Left Entrance Stairs-Number of Steps: 1 Discharge Bathroom Shower/Tub: Tub/shower unit Discharge Bathroom Toilet: Standard Discharge Bathroom Accessibility: Yes How Accessible: Accessible via walker Does the patient have any problems obtaining your medications?: No  Social/Family/Support Systems Patient Roles: Spouse Anticipated Caregiver: Eyvonne Burchfield Anticipated Caregiver's Contact Information:  [phone] Ability/Limitations of Caregiver: none stated Caregiver Availability: 24/7 Discharge Plan Discussed with Primary Caregiver: Yes Is Caregiver In Agreement with Plan?: Yes Does Caregiver/Family have Issues with Lodging/Transportation while Pt is in Rehab?: No  Goals Patient/Family Goal for Rehab: PT/OT mod I, SLP n/a Expected length of stay: 6-9 days Additional Information: discharge plan: mod I goals, home with spouse who is available 24/7 Pt/Family Agrees to Admission and willing to participate: Yes Program Orientation Provided & Reviewed with Pt/Caregiver Including Roles  & Responsibilities: Yes  Decrease burden of Care through IP rehab admission: n/a  Possible need for SNF placement upon  discharge: Not anticipated.  Plan for mod I goals and d/c to pt's home with spouse.   Patient Condition: I have reviewed medical records from [facility], spoken with CM, and patient and spouse. I met with patient at the bedside for inpatient rehabilitation assessment.  Patient will benefit from ongoing PT and OT, can actively participate in 3 hours of therapy a day 5 days of the week, and can make measurable gains during the admission.  Patient will also benefit from the coordinated team approach during an Inpatient Acute Rehabilitation admission.  The patient will receive intensive therapy as well as Rehabilitation physician, nursing, social worker, and care management interventions.  Due to safety, skin/wound care, disease management, medication administration, pain management, and patient education the patient requires 24 hour a day rehabilitation nursing.  The patient is currently min to mod assist with mobility and basic ADLs.  Discharge setting and therapy post discharge at home with home health is anticipated.  Patient has agreed to participate in the Acute Inpatient Rehabilitation Program and will admit today.  Preadmission Screen Completed By:  Stephania Fragmin, PT, DPT 05/01/2022 10:28  AM ______________________________________________________________________   Discussed status with Dr. Riley Kill on 05/01/22  at 10:36 AM  and received approval for admission today.  Admission Coordinator:  Stephania Fragmin, PT, DPT time 10:36 AM Dorna Bloom 05/01/22    Assessment/Plan: Diagnosis: polytrauma after fall thru deck Does the need for close, 24 hr/day Medical supervision in concert with the patient's rehab needs make it unreasonable for this patient to be served in a less intensive setting? Yes Co-Morbidities requiring supervision/potential complications: left breast cancer s/p mastectomy, HTN, PAD, PE, DM Due to bladder management, bowel management, safety, skin/wound care, disease management, medication administration, pain management, and patient education, does the patient require 24 hr/day rehab nursing? Yes Does the patient require coordinated care of a physician, rehab nurse, PT, OT to address physical and functional deficits in the context of the above medical diagnosis(es)? Yes Addressing deficits in the following areas: balance, endurance, locomotion, strength, transferring, bowel/bladder control, bathing, dressing, feeding, grooming, toileting, and psychosocial support Can the patient actively participate in an intensive therapy program of at least 3 hrs of therapy 5 days a week? Yes The potential for patient to make measurable gains while on inpatient rehab is good Anticipated functional outcomes upon discharge from inpatient rehab: modified independent PT, modified independent OT, n/a SLP Estimated rehab length of stay to reach the above functional goals is: 6-9 days Anticipated discharge destination: Home 10. Overall Rehab/Functional Prognosis: excellent   MD Signature: Ranelle Oyster, MD, [facility] Physical Medicine & Rehabilitation Medical Director Rehabilitation Services 05/01/2022

## 2022-05-01 NOTE — Progress Notes (Signed)
Inpatient Rehab Admissions Coordinator:   I have a bed for this patient to admit today.  Will confirm with trauma service that she is ready and plan for admit.  Will let pt/family know.  TOC aware.   Estill Dooms, PT, DPT Admissions Coordinator [phone] 05/01/22  10:23 AM

## 2022-05-01 NOTE — Progress Notes (Signed)
[facility] Progress Note     Subjective: CC:  Cc is pain when trying to get out of bed. Tolerating PO. Denies SOB. Pulling 500 on IS.   Objective: Vital signs in last 24 hours: Temp:  [97.8 F (36.6 C)-98.4 F (36.9 C)] 97.8 F (36.6 C) (04/19 0300) Pulse Rate:  [63-75] 63 (04/19 0300) Resp:  [16-20] 17 (04/19 0300) BP: (113-164)/(47-89) 135/89 (04/19 0300) SpO2:  [91 %-95 %] 94 % (04/19 0300) Last BM Date : 04/24/22  Intake/Output from previous day: 04/18 0701 - 04/19 0700 In: 360 [P.O.:360] Out: 1000 [Urine:1000] Intake/Output this shift: No intake/output data recorded.  PE: Gen:  Alert, cooperative  Card:  Regular rate and rhythm, pedal pulses 2+ BL Pulm:  Normal effort on room air (90-93%), CTAB decreased lung sounds at  bases and no wheezing or crackles   Inerval removal of chest tube. Abd: Soft, non-tender, non-distended Skin: warm and dry, no rashes  Flank hematomas stable. Psych: A&Ox3   Lab Results:  Recent Labs    04/28/22 0852 04/30/22 0104  WBC 8.5 10.5  HGB 9.2* 8.5*  HCT 27.1* 25.1*  PLT 199 251   BMET Recent Labs    04/28/22 0852 04/30/22 0104  NA 134* 133*  K 4.0 4.0  CL 108 105  CO2 19* 20*  GLUCOSE 156* 160*  BUN 23 17  CREATININE 1.19* 1.03*  CALCIUM 7.7* 8.1*   PT/INR No results for input(s): "LABPROT", "INR" in the last 72 hours.  CMP     Component Value Date/Time   NA 133 (L) 04/30/2022 0104   NA 137 07/22/2016 1018   NA 140 01/23/2016 1323   K 4.0 04/30/2022 0104   K 4.2 01/23/2016 1323   CL 105 04/30/2022 0104   CL 107 04/21/2012 0941   CO2 20 (L) 04/30/2022 0104   CO2 26 01/23/2016 1323   GLUCOSE 160 (H) 04/30/2022 0104   GLUCOSE 113 01/23/2016 1323   GLUCOSE 121 (H) 04/21/2012 0941   BUN 17 04/30/2022 0104   BUN 18 07/22/2016 1018   BUN 19.4 01/23/2016 1323   CREATININE 1.03 (H) 04/30/2022 0104   CREATININE 1.1 01/23/2016 1323   CALCIUM 8.1 (L) 04/30/2022 0104   CALCIUM 10.0 01/23/2016 1323    PROT 7.3 04/24/2022 1608   PROT 7.3 01/27/2016 1034   PROT 7.5 01/23/2016 1323   ALBUMIN 4.0 04/24/2022 1608   ALBUMIN 4.6 01/27/2016 1034   ALBUMIN 4.2 01/23/2016 1323   AST 876 (H) 04/24/2022 1608   AST 21 01/23/2016 1323   ALT 738 (H) 04/24/2022 1608   ALT 15 01/23/2016 1323   ALKPHOS 58 04/24/2022 1608   ALKPHOS 57 01/23/2016 1323   BILITOT 1.0 04/24/2022 1608   BILITOT 0.4 01/27/2016 1034   BILITOT 0.60 01/23/2016 1323   GFRNONAA 57 (L) 04/30/2022 0104   GFRAA 46 (L) 02/22/2017 2032   Lipase     Component Value Date/Time   LIPASE 61 (H) 02/22/2017 2032       Studies/Results: DG CHEST PORT 1 VIEW  Result Date: 04/30/2022 CLINICAL DATA:  Recent chest tube removal for right-sided pneumothorax. EXAM: PORTABLE CHEST 1 VIEW COMPARISON:  04/30/2022 6:29 a.m. FINDINGS: The right pleural pigtail catheter is been removed. No current visible pneumothorax. Persistent bandlike right infrahilar density. Clips are noted in the right axilla. Stable retrocardiac opacity on the left with blunting of the left costophrenic angle. Prior median sternotomy. Atherosclerotic calcification of the aortic arch. Indistinctness of the pulmonary vasculature potentially reflecting  pulmonary venous hypertension. Degenerative spurring of the humeral heads. IMPRESSION: 1. No pneumothorax status post right pleural pigtail catheter removal. 2. Stable retrocardiac opacity on the left with blunting of the left costophrenic angle. 3. Stable bandlike density in the right infrahilar region. 4. Indistinctness of the pulmonary vasculature potentially reflecting pulmonary venous hypertension or mild edema. Electronically Signed   By: Gaylyn Rong M.D.   On: 04/30/2022 14:45   DG CHEST PORT 1 VIEW  Result Date: 04/30/2022 CLINICAL DATA:  [id number] Chest tube in place [id number] Pneumothorax, right [id number] EXAM: PORTABLE CHEST 1 VIEW COMPARISON:  04/29/2022 FINDINGS: Right chest tube remains position near the right  lung apex. No visible pneumothorax. Stable heart size status post sternotomy. Persistent small left pleural effusion with associated left basilar opacity. Right basilar atelectasis. Slightly improving aeration of the lung bases. IMPRESSION: 1. Right chest tube remains in place. No visible pneumothorax. 2. Slightly improving aeration of the lung bases. 3. Persistent small left pleural effusion with associated left basilar opacity. Electronically Signed   By: Duanne Guess D.O.   On: 04/30/2022 08:28    Anti-infectives: Anti-infectives (From admission, onward)    None        Assessment/Plan  77 y/o F s/p fall from 8-10 feet without LOC T11 vertebral body FX - NS c/s, Dr. Yetta Barre. Non-op mgmt. Plan upright films with no visible FX. Non-op mgmt with TLSO L1-3 TP FX  Right anterior PTX - gradual increase in size of PTX >> chest tube placed 4/15.  CT removed 4/18 and 4h follow up film negative for PTX. Wean O2. R Posterior Rib FX 11-12 - multimodal pain control  L hand abrasion - no underlying FX, chronic OA  ABL anemia - S/p 1 u pRBC 4/14 for hgb 7.5. hgb stabilizing. 8.5 today fron 9.2 yesterday AKI - creatinine 1.86 > 1.56 > 1.19 > 1.03 improved with gentle IVF and holding nephrotoxic agents. Diabetes mellitus - SSI CAD, PAD - cardiologist Dr. Elease Hashimoto, hold DAPT, hgb stabilizing so will discuss timing of resumption with MD. GERD   FEN - Reg, SLIV, ensure VTE - SCD's, received first dose of lovenox 4/17 PM ID - given ancef and tdap in ED 4/12 Admit - trauma, medically stable for discharge to CIR. PT/OT     LOS: 6 days   I reviewed nursing notes, ED provider notes, Consultant NS notes, last 24 h vitals and pain scores, last 48 h intake and output, last 24 h labs and trends, and last 24 h imaging results.  This care required moderate level of medical decision making.   Hosie Spangle, PA-C [facility] Please see Amion for pager number during day hours 7:00am-4:30pm

## 2022-05-01 NOTE — H&P (Signed)
Physical Medicine and Rehabilitation Admission H&P    Chief Complaint  Patient presents with   Functional deficits due to polytrauma    HPI: Taylor [name] is a 77 year old female with history of CAD, PAD, PE, T2DM, anxiety/depression, recurrent Melanoma right arm, breast cancer who was admitted on 04/24/22 after falling 8-10 feet through a deck with multiple abrasions and reports of pain in back, neck and chest. No LOC and hypotensive enroute to the hospital and lactic acidosis noted at admission. She was treated with IVF and DAPT held per discussion with Dr. Elease Hashimoto. She was found to have R 11-12 th rib fractures, small right PTX,  acute nondisplaced T-11 vertebral body Fx w/extension into bridging osteophte, L1-L3 TVP FX as well as incidental findings of severe facet arthropathy L3/L4 with anterolisthesis of L3 on L4. Dr. Yetta Barre recommended upright films once mobile and this showed stable Fx without distraction or compression.  TLSO ordered and to be used when up and out of bed with recs for repeat films in 2 weeks and follow up in office. Hospital course significant for progressive SOB w/severe hypoxia with sats in 70's on 04/15 and found to have increasing R-PTX which was treated with CT placement. Respiratory status improved and she was gradually weaned off oxygen to RA yesterday.  CT removed on 04/18 and follow up CXR was negative for PTX with persistent bandlike infrahilar density on the right.   Hospital course also significant for AKI  with BUN/SCr- 34/1.86 which improved with gentle IVF, ABLA requiring one unit PRBC with repeat H/H at 8.5/25.1.  PT/OT has been working with patient who continues to be limited by pain, weakness, decreased posture with now BOS, SOB w/activity as well as mild dizziness. CIR recommended due to functional decline.    Review of Systems  Constitutional:  Negative for fever.  Eyes:  Negative for blurred vision and double vision.  Respiratory:  Positive for  shortness of breath. Negative for cough.   Cardiovascular:  Negative for chest pain and leg swelling.  Gastrointestinal:  Positive for constipation. Negative for heartburn and nausea.  Genitourinary:  Negative for dysuria and urgency.  Musculoskeletal:  Positive for back pain and myalgias.  Skin:  Negative for rash.  Neurological:  Positive for dizziness (some since fall), weakness and headaches (chronic HA).  Psychiatric/Behavioral:  The patient has insomnia (chronic --uses xanax prn). The patient is not nervous/anxious.      Past Medical History:  Diagnosis Date   Cancer of left breast    Complication of anesthesia    Coronary artery disease    a. 2006 s/p CABG x 4 (LIMA->LAD, VG->Diag->OM, VG->RPDA);  b. 02/2015: [place] s/p DES to [facility], VG->PDA & VG->D1->OM 100; c. 06/2015 Cath: diffuse dzs->Med rx; d. 11/2015 Cath/PCI: LM nl, LAD 100ost, 95d, D1 90ost, LCX 35ost, 65p, OM2 40, lat OM2 90, RCA 95p (2.75x16 Synergy DES), patent mid stent, RPLB 70, LIMA->LAD ok.EF55-65%.   Depression    Hx of tobacco use, presenting hazards to health    a. quit 2006.   Hypercholesteremia    Hypertension    Hypertensive heart disease    Echo 04/2019: Inferior HK, EF 55, normal RV SF, RVSP normal at 29.2, mild BAE, trivial MR, mild aortic valve sclerosis without stenosis   Left carotid bruit    a. 09/2014 Carotid U/S: 1-39% bilat ICA stenosis.   PAD (peripheral artery disease)    a. 11/2014 ABI: R: 0.63, L 0.59;  b. 11/2014 Periph  Angio: bilat pop occlusions. L - short w/ reconstituion via collats in dist pop w/ 2 vessel runoff, R long w/ reconstitution in prox tib/peroneal arteries-->med Rx w/ pletal.   Pulmonary embolism a. 2014.   Reflux esophagitis    Type II diabetes mellitus     Past Surgical History:  Procedure Laterality Date   BREAST BIOPSY Left ~ 2012   BUNIONECTOMY Left 1970s   CARDIAC CATHETERIZATION  04/2004   CARDIAC CATHETERIZATION N/A 02/21/2015   Procedure: Left Heart Cath and Cors/Grafts  Angiography;  Surgeon: Lyn Records, MD;  Location[facility] INVASIVE CV LAB;  Service: Cardiovascular;  Laterality: N/A;   CARDIAC CATHETERIZATION N/A 02/21/2015   Procedure: Coronary Stent Intervention;  Surgeon: Lyn Records, MD;  Location: [facility] INVASIVE CV LAB;  Service: Cardiovascular;  Laterality: N/A;   CARDIAC CATHETERIZATION N/A 02/21/2015   Procedure: Intravascular Pressure Wire/FFR Study;  Surgeon: Lyn Records, MD;  Location: [facility] INVASIVE CV LAB;  Service: Cardiovascular;  Laterality: N/A;   CARDIAC CATHETERIZATION N/A 06/20/2015   Procedure: Left Heart Cath and Cors/Grafts Angiography;  Surgeon: Laurey Morale, MD;  Location: [facility] INVASIVE CV LAB;  Service: Cardiovascular;  Laterality: N/A;   CARDIAC CATHETERIZATION N/A 11/19/2015   Procedure: Left Heart Cath and Cors/Grafts Angiography;  Surgeon: Peter M Swaziland, MD;  Location: [facility] INVASIVE CV LAB;  Service: Cardiovascular;  Laterality: N/A;   CARDIAC CATHETERIZATION N/A 11/19/2015   Procedure: Coronary Stent Intervention;  Surgeon: Peter M [place], MD;  Location: [facility] INVASIVE CV LAB;  Service: Cardiovascular;  Laterality: N/A;   CATARACT EXTRACTION W/ INTRAOCULAR LENS  IMPLANT, BILATERAL Bilateral 1980s-1990s   CORONARY ANGIOPLASTY WITH STENT PLACEMENT  02/21/2015   "1 stent"   CORONARY ARTERY BYPASS GRAFT  05/2004   "CABG X4"   EYE SURGERY     LAPAROSCOPIC CHOLECYSTECTOMY  1990s   MASTECTOMY Left ~ 2012   PERIPHERAL VASCULAR CATHETERIZATION N/A 12/12/2014   Procedure: Abdominal Aortogram;  Surgeon: Iran Ouch, MD;  Location: [facility] INVASIVE CV LAB;  Service: Cardiovascular;  Laterality: N/A;   RETINAL DETACHMENT SURGERY Right 1990s    Family History  Problem Relation Age of Onset   Coronary artery disease Mother 85       status post CABG    Heart attack Father 88       deceased secondary to fatal  first myocardial infarction   Coronary artery disease Sister 11       alive had bypass surgery   Cancer Sister    Deep vein thrombosis Cousin         Neice had after knee surgery    Social History:  Married. Independent PTA. 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 She reports that she quit smoking about 17 years ago. Her smoking use included cigarettes. She has a 7.50 pack-year smoking history. She has never used smokeless tobacco. She reports that she does not drink alcohol and does not use drugs.   Allergies  Allergen Reactions   Niacin Other (See Comments)    burning   Duloxetine     Other reaction(s): Other (See Comments)   Amoxicillin Rash   Ibuprofen Rash   Lipitor [Atorvastatin] Other (See Comments)    Muscle pain    Medications Prior to Admission  Medication Sig Dispense Refill   acetaminophen (TYLENOL) 500 MG tablet Take 1,000 mg by mouth every 6 (six) hours as needed for headache (pain).      ALPRAZolam (XANAX) 0.25  MG tablet Take 0.25 mg by mouth daily as needed for anxiety.      clopidogrel (PLAVIX) 75 MG tablet Take 1 tablet (75 mg total) by mouth daily. 30 tablet 11   glipiZIDE (GLUCOTROL) 5 MG tablet Take 10 mg by mouth daily.     isosorbide mononitrate (IMDUR) 60 MG 24 hr tablet Take 1 tablet by mouth once daily 90 tablet 3   levothyroxine (SYNTHROID) 75 MCG tablet Take 75 mcg by mouth daily before breakfast.     lisinopril (ZESTRIL) 10 MG tablet Take 10 mg by mouth daily.     metFORMIN (GLUCOPHAGE) 500 MG tablet Take 1,000 mg by mouth 2 (two) times daily with a meal.      rosuvastatin (CRESTOR) 20 MG tablet TAKE ONE TABLET BY MOUTH ONCE DAILY AT  6PM (Patient taking differently: Take 20 mg by mouth daily.) 90 tablet 3   sertraline (ZOLOFT) 100 MG tablet Take 100 mg by mouth daily.      timolol (BETIMOL) 0.25 % ophthalmic solution 1-2 drops 2 (two) times daily.     aspirin 81 MG tablet Take 81 mg by mouth daily. (Patient not taking: Reported on 04/27/2022)     nitroGLYCERIN (NITROSTAT) 0.4 MG SL tablet Place 1 tablet (0.4 mg total) under the tongue every 5 (five) minutes as needed for  chest pain. (Patient not taking: Reported on 04/27/2022) 30 tablet 3   pioglitazone (ACTOS) 15 MG tablet Take 1 tablet by mouth daily. (Patient not taking: Reported on 04/21/2022)        Home: Home Living Family/patient expects to be discharged to:: Private residence Living Arrangements: Spouse/significant other Available Help at Discharge: Family Type of Home: House Home Access: Stairs to enter Secretary/administrator of Steps: 1 Entrance Stairs-Rails: Left Home Layout: One level Bathroom Shower/Tub: Tub/shower unit Home Equipment: Agricultural consultant (2 wheels), Wheelchair - manual, Shower seat Additional Comments: Pt lives at home with husband, both independent, husband is able to assist as needed, retired.   Functional History: Prior Function Prior Level of Function : Independent/Modified Independent Mobility Comments: independent, no AD ADLs Comments: independent  Functional Status:  Mobility: Bed Mobility Overal bed mobility: Needs Assistance Bed Mobility: Supine to Sit Rolling: Min assist Sidelying to sit: +2 for physical assistance, Mod assist Supine to sit: Mod assist, +2 for safety/equipment, HOB elevated Sit to sidelying: Mod assist General bed mobility comments: Pt left sitting in recliner Transfers Overall transfer level: Needs assistance Equipment used: Rolling walker (2 wheels) Transfers: Sit to/from Stand Sit to Stand: Min assist Bed to/from chair/wheelchair/BSC transfer type:: Step pivot Step pivot transfers: From elevated surface, Min assist General transfer comment: From very elevated bed surface<>RW and RW<>BSC, initial verbal/tactile cues for safe UE placement within back precs; painful transition, but pt with fair eccentric control for stand>sit on BSC and on recliner chair (with pillows on seat). Pt with slightly improved foot clearance this date but still with small low steps and needs dense cues for posture, RW management and body  mechanics. Ambulation/Gait Ambulation/Gait assistance: Min assist Gait Distance (Feet): 6 Feet Assistive device: Rolling walker (2 wheels) Gait Pattern/deviations: Decreased stride length, Step-to pattern, Narrow base of support, Shuffle General Gait Details: Pt with slightly improved foot clearance this date but still with small low steps and needs dense cues for posture, RW management and body mechanics. Pt c/o increased severe pain after ~72ft so chair pulled up for pt to sit in, pt on 2L O2 [place] for gait task but weaned to RA  per RN request once in recliner. HR 112-128 bpm with exertion and was 78 bpm resting. Pt placed on 2L O2 [place] for gait trial as she desat to SpO2 80-86% with pivot to BSC on RA and with shallow breaths. On 2L, SpO2 90% and greater but only ~38ft distance. Gait velocity: <0.2 m/s Gait velocity interpretation: <1.31 ft/sec, indicative of household ambulator    ADL: ADL Overall ADL's : Needs assistance/impaired Eating/Feeding: Set up Grooming: Sitting, Supervision/safety, Set up, Wash/dry face Upper Body Bathing: Minimal assistance Lower Body Bathing: Maximal assistance Upper Body Dressing : Minimal assistance Lower Body Dressing: Maximal assistance Toilet Transfer: Minimal assistance Toileting- Clothing Manipulation and Hygiene: Minimal assistance Tub/ Shower Transfer: Minimal assistance Functional mobility during ADLs: Min guard, Rolling walker (2 wheels) General ADL Comments: Does well overall,  Cognition: Cognition Overall Cognitive Status: No family/caregiver present to determine baseline cognitive functioning Orientation Level: Oriented X4 Cognition Arousal/Alertness: Awake/alert Behavior During Therapy: WFL for tasks assessed/performed Overall Cognitive Status: No family/caregiver present to determine baseline cognitive functioning General Comments: Poor recall of log rolling for precautions   Blood pressure (!) 121/55, pulse 71, temperature 98.1 F (36.7  C), temperature source Oral, resp. rate 18, height 5' 7.5" (1.715 m), weight 80 kg, SpO2 93 %. Physical Exam Vitals and nursing note reviewed.  Constitutional:      Appearance: Normal appearance.     Comments: Up in chair with TLSO in place. ON 2 L oxygen per [place]--fatigued appearing.   HENT:     Head: Normocephalic.     Right Ear: External ear normal.     Nose: Nose normal.     Mouth/Throat:     Pharynx: Oropharynx is clear.  Eyes:     Extraocular Movements: Extraocular movements intact.     Pupils: Pupils are equal, round, and reactive to light.  Cardiovascular:     Rate and Rhythm: Normal rate and regular rhythm.     Heart sounds: No murmur heard.    No gallop.  Pulmonary:     Effort: Pulmonary effort is normal. No respiratory distress.     Breath sounds: Normal breath sounds. No wheezing.  Abdominal:     General: Bowel sounds are normal.     Palpations: Abdomen is soft.  Musculoskeletal:     Cervical back: Normal range of motion.     Right lower leg: No edema.     Left lower leg: No edema.     Comments: Pt in TLSO. Tender to palpation along right chest wall/axillary line. No tenderness in neck, arms, or lower ext  Skin:    Comments: Small abrasion right lower leg. Large ecchymoses on either arm.  Neurological:     Mental Status: She is alert and oriented to person, place, and time.     Comments: Speech clear. Able to follow simple motor commands. Had difficulty raising prox LLE but is 4/5 distally. . LUE   slower to initiate due to back pain but generally 3-4/5. RUE 4/5. No sensory findings. Normal cognition and focus. Normal reflexes  Psychiatric:        Mood and Affect: Mood normal.        Behavior: Behavior normal.     Results for orders placed or performed during the hospital encounter of 04/24/22 (from the past 48 hour(s))  Glucose, capillary     Status: Abnormal   Collection Time: 04/29/22 11:14 AM  Result Value Ref Range   Glucose-Capillary 147 (H) 70 - 99 mg/dL  Comment: Glucose reference range applies only to samples taken after fasting for at least 8 hours.  Glucose, capillary     Status: Abnormal   Collection Time: 04/29/22  4:55 PM  Result Value Ref Range   Glucose-Capillary 178 (H) 70 - 99 mg/dL    Comment: Glucose reference range applies only to samples taken after fasting for at least 8 hours.  Glucose, capillary     Status: Abnormal   Collection Time: 04/29/22  9:30 PM  Result Value Ref Range   Glucose-Capillary 153 (H) 70 - 99 mg/dL    Comment: Glucose reference range applies only to samples taken after fasting for at least 8 hours.   Comment 1 Notify RN    Comment 2 Document in Chart   CBC     Status: Abnormal   Collection Time: 04/30/22  1:04 AM  Result Value Ref Range   WBC 10.5 4.0 - 10.5 K/uL   RBC 2.82 (L) 3.87 - 5.11 MIL/uL   Hemoglobin 8.5 (L) 12.0 - 15.0 g/dL   HCT 16.1 (L) 09.6 - 04.5 %   MCV 89.0 80.0 - 100.0 fL   MCH 30.1 26.0 - 34.0 pg   MCHC 33.9 30.0 - 36.0 g/dL   RDW 40.9 (H) 81.1 - 91.4 %   Platelets 251 150 - 400 K/uL   nRBC 0.3 (H) 0.0 - 0.2 %    Comment: Performed at [facility], [address], [place], [place] [address]  Basic metabolic panel     Status: Abnormal   Collection Time: 04/30/22  1:04 AM  Result Value Ref Range   Sodium 133 (L) 135 - 145 mmol/L   Potassium 4.0 3.5 - 5.1 mmol/L   Chloride 105 98 - 111 mmol/L   CO2 20 (L) 22 - 32 mmol/L   Glucose, Bld 160 (H) 70 - 99 mg/dL    Comment: Glucose reference range applies only to samples taken after fasting for at least 8 hours.   BUN 17 8 - 23 mg/dL   Creatinine, Ser 6.21 (H) 0.44 - 1.00 mg/dL   Calcium 8.1 (L) 8.9 - 10.3 mg/dL   GFR, Estimated 57 (L) >60 mL/min    Comment: (NOTE) Calculated using the CKD-EPI Creatinine Equation (2021)    Anion gap 8 5 - 15    Comment: Performed at [facility], [address], [place], [place] [address]  Glucose, capillary     Status: Abnormal   Collection Time: 04/30/22  6:07 AM  Result  Value Ref Range   Glucose-Capillary 140 (H) 70 - 99 mg/dL    Comment: Glucose reference range applies only to samples taken after fasting for at least 8 hours.   Comment 1 Notify RN    Comment 2 Document in Chart   Glucose, capillary     Status: Abnormal   Collection Time: 04/30/22 11:51 AM  Result Value Ref Range   Glucose-Capillary 164 (H) 70 - 99 mg/dL    Comment: Glucose reference range applies only to samples taken after fasting for at least 8 hours.  Glucose, capillary     Status: Abnormal   Collection Time: 04/30/22  4:40 PM  Result Value Ref Range   Glucose-Capillary 221 (H) 70 - 99 mg/dL    Comment: Glucose reference range applies only to samples taken after fasting for at least 8 hours.  Glucose, capillary     Status: Abnormal   Collection Time: 04/30/22  9:06 PM  Result Value Ref Range  Glucose-Capillary 175 (H) 70 - 99 mg/dL    Comment: Glucose reference range applies only to samples taken after fasting for at least 8 hours.   Comment 1 Notify RN    Comment 2 Document in Chart   Glucose, capillary     Status: Abnormal   Collection Time: 05/01/22  6:44 AM  Result Value Ref Range   Glucose-Capillary 159 (H) 70 - 99 mg/dL    Comment: Glucose reference range applies only to samples taken after fasting for at least 8 hours.   Comment 1 Notify RN    Comment 2 Document in Chart    DG CHEST PORT 1 VIEW  Result Date: 04/30/2022 CLINICAL DATA:  Recent chest tube removal for right-sided pneumothorax. EXAM: PORTABLE CHEST 1 VIEW COMPARISON:  04/30/2022 6:29 a.m. FINDINGS: The right pleural pigtail catheter is been removed. No current visible pneumothorax. Persistent bandlike right infrahilar density. Clips are noted in the right axilla. Stable retrocardiac opacity on the left with blunting of the left costophrenic angle. Prior median sternotomy. Atherosclerotic calcification of the aortic arch. Indistinctness of the pulmonary vasculature potentially reflecting pulmonary venous  hypertension. Degenerative spurring of the humeral heads. IMPRESSION: 1. No pneumothorax status post right pleural pigtail catheter removal. 2. Stable retrocardiac opacity on the left with blunting of the left costophrenic angle. 3. Stable bandlike density in the right infrahilar region. 4. Indistinctness of the pulmonary vasculature potentially reflecting pulmonary venous hypertension or mild edema. Electronically Signed   By: Gaylyn Rong M.D.   On: 04/30/2022 14:45   DG CHEST PORT 1 VIEW  Result Date: 04/30/2022 CLINICAL DATA:  [id number] Chest tube in place [id number] Pneumothorax, right [id number] EXAM: PORTABLE CHEST 1 VIEW COMPARISON:  04/29/2022 FINDINGS: Right chest tube remains position near the right lung apex. No visible pneumothorax. Stable heart size status post sternotomy. Persistent small left pleural effusion with associated left basilar opacity. Right basilar atelectasis. Slightly improving aeration of the lung bases. IMPRESSION: 1. Right chest tube remains in place. No visible pneumothorax. 2. Slightly improving aeration of the lung bases. 3. Persistent small left pleural effusion with associated left basilar opacity. Electronically Signed   By: Duanne Guess D.O.   On: 04/30/2022 08:28      Blood pressure (!) 121/55, pulse 71, temperature 98.1 F (36.7 C), temperature source Oral, resp. rate 18, height 5' 7.5" (1.715 m), weight 80 kg, SpO2 93 %.  Medical Problem List and Plan: 1. Functional deficits secondary to polytrauma including right 11-12th rib fx with PTX, T11 Vertebral body fx, lumbar spine fx's  -patient may shower with brace off and back supported.   -ELOS/Goals: 6-9 days, mod I goals  -apply TLSO in sitting position 2.  Antithrombotics: -DVT/anticoagulation:  Pharmaceutical: Lovenox--will decrease to 40 mg daily  -antiplatelet therapy: DAPT resumed today (04/19). Recheck CBC in am.  3. Pain Management: D/c tylenol 1000 mg qid due to abnormal LFTs. Continue  robaxin 1000 TID. Will schedule tramadol 50 mg qid.  --Oxycodone prn.  4. Mood/Behavior/Sleep: LCSW to follow for evaluation and support.   -antipsychotic agents: N/A  --Xanax prn as at home for insomnia.  5. Neuropsych/cognition: This patient is capable of making decisions on her own behalf. 6. Skin/Wound Care: Routine pressure relief measures.  7. Fluids/Electrolytes/Nutrition: Monitor I/O. Check CMET in am.  8. Right rib Fx/R-PTX:  Encourage pulmonary hygiene. Flutter valve added.  --SOB/DOE w/activity-->wean oxygen to off as tolerated.  9. T2DM: Hgb A1c- 7.6 and reasonably controlled. Was on glipizide and metformin PTA. --  Monitor BS ac/hs and use SSI for elevated BS  --continue to hold metformin--?CKD. May need alternate medication.    -f/u labs as they're available 10. Acute on chronic renal failure: BUN/SCr-21/1.4 at admission-->34/1.83-->17/1.03 today  --reports intake has been poor due to dental pain as well as recent surgery.   --Zestril was d/c 04/13. Encourage fluid intake.  11. CAD s/p CABG: Monitor for symptoms with increase in activity  --on Imdur and off Lisinopril for AKI. DAPT. Resumed. Continue to hold Crestor.  12. H/o depression w/ anxiety: Continue Zoloft w/ xanax prn.  --monitor sodium levels for further drop.  13. Abnormal LFTs: AST-876 and ALT 738 at admission likely due to shocked liver v/s excessive tylenol use PTA due to dental pain/procedure --Will d/c tylenol and recheck in am. 14. ABLA: Monitor H/H with serial checks. Did drop from 12.2-->8.5 -- Recheck CBC in am. 15.  Hyponatremia: Recheck CMET in am.  16. OIC: On Miralax and colace-->increase miralax to bid as no BM since admission  --Sorbitol prn.   17. Recurrent melanoma s/p resection 02/2022: Completed Tx/on surveillance.   18. Hypothyroid: On supplement.  19. Code status: Dicussed w/patient who wants to be full code.       Jacquelynn Cree, PA-C 05/01/2022

## 2022-05-01 NOTE — Progress Notes (Signed)
PMR Admission Coordinator Pre-Admission Assessment  Patient: Taylor [name] is an 77 y.o., female MRN: [id number] DOB: 07/14/1945 Height: 5' 7.5" (171.5 cm) Weight: 80 kg  Insurance Information HMO:     PPO:      PCP:      IPA:      80/20:      OTHER:  PRIMARY: Medicare A/B      Policy#: [id number]      Subscriber: pt CM Name:       Phone#:      Fax#:  Pre-Cert#: verified on passport one      Employer:  Benefits:  Phone #:      Name:  Eff. Date: 02/13/12 A/B     Deduct: $1632      Out of Pocket Max: n/a      Life Max: n/a CIR: 100%      SNF: 20 full days (20 remaining) Outpatient: 80%     Co-Pay: 20% Home Health: 100%      Co-Pay:  DME: 80%     Co-Pay: 20% Providers:  SECONDARY: UHC       Policy#: [id number]     Phone#: [phone]  Financial Counselor:       Phone#:   The "Data Collection Information Summary" for patients in Inpatient Rehabilitation Facilities with attached "Privacy Act Statement-Health Care Records" was provided and verbally reviewed with: Patient  Emergency Contact Information Contact Information     Name Relation Home Work Mobile   Helmstetter,William Spouse [phone]  [phone]   Powell,Carolyn Sister   [phone]   Smelser,Jason Son   [phone]       Current Medical History  Patient Admitting Diagnosis: polytrauma  History of Present Illness: Pt is a 77 y/o female with PMH of L breast cancer s/p mastectomy ~4 months PTA, HTN, PAD, pulmonary embolism, and DM admitted on 04/24/22 after she fell through her son's deck.  In ED labs unremarkable, BP soft at 102/62, resolved with hydration.  Trauma workup revealed T11 vertebral body fracture, L1-3 TP fracture, R posterior rib fx 11-12, L hand abrasions.  Neurosurgery was consulted and recommended non op management with TLSO with chest plate when OOB.  Pt did develop R anterior PTX and required chest tube placement 04/27/22, which was d/c'd on 4/18 with f/u imaging showing resolved PTX.  Therapy ongoing and  pt was recommended for CIR.     Patient's medical record from [place] has been reviewed by the rehabilitation admission coordinator and physician.  Past Medical History  Past Medical History:  Diagnosis Date   Cancer of left breast    Complication of anesthesia    Coronary artery disease    a. 2006 s/p CABG x 4 (LIMA->LAD, VG->Diag->OM, VG->RPDA);  b. 02/2015: [place] s/p DES to mRCA, VG->PDA & VG->D1->OM 100; c. 06/2015 Cath: diffuse dzs->Med rx; d. 11/2015 Cath/PCI: LM nl, LAD 100ost, 95d, D1 90ost, LCX 35ost, 65p, OM2 40, lat OM2 90, RCA 95p (2.75x16 Synergy DES), patent mid stent, RPLB 70, LIMA->LAD ok.EF55-65%.   Depression    Hx of tobacco use, presenting hazards to health    a. quit 2006.   Hypercholesteremia    Hypertension    Hypertensive heart disease    Echo 04/2019: Inferior HK, EF 55, normal RV SF, RVSP normal at 29.2, mild BAE, trivial MR, mild aortic valve sclerosis without stenosis   Left carotid bruit    a. 09/2014 Carotid U/S: 1-39% bilat ICA stenosis.   PAD (peripheral artery disease)      a. 11/2014 ABI: R: 0.63, L 0.59;  b. 11/2014 Periph Angio: bilat pop occlusions. L - short w/ reconstituion via collats in dist pop w/ 2 vessel runoff, R long w/ reconstitution in prox tib/peroneal arteries-->med Rx w/ pletal.   Pulmonary embolism a. 2014.   Reflux esophagitis    Type II diabetes mellitus     Has the patient had major surgery during 100 days prior to admission? No  Family History   family history includes Cancer in her sister; Coronary artery disease (age of onset: 59) in her sister; Coronary artery disease (age of onset: 83) in her mother; Deep vein thrombosis in her cousin; Heart attack (age of onset: 76) in her father.  Current Medications  Current Facility-Administered Medications:    acetaminophen (TYLENOL) tablet 1,000 mg, 1,000 mg, Oral, Q6H, Lovick, Ayesha N, MD, 1,000 mg at 05/01/22 0513   ALPRAZolam (XANAX) tablet 0.25 mg, 0.25 mg, Oral, Daily PRN, Simaan,  Elizabeth S, PA-C   aspirin chewable tablet 81 mg, 81 mg, Oral, Daily, Simaan, Elizabeth S, PA-C, 81 mg at 05/01/22 0937   clopidogrel (PLAVIX) tablet 75 mg, 75 mg, Oral, Daily, Simaan, Elizabeth S, PA-C, 75 mg at 05/01/22 0937   docusate sodium (COLACE) capsule 100 mg, 100 mg, Oral, BID, Lovick, Ayesha N, MD, 100 mg at 05/01/22 0935   enoxaparin (LOVENOX) injection 30 mg, 30 mg, Subcutaneous, Q12H, Simaan, Elizabeth S, PA-C, 30 mg at 04/30/22 2335   feeding supplement (ENSURE ENLIVE / ENSURE PLUS) liquid 237 mL, 237 mL, Oral, TID WC, Simaan, Elizabeth S, PA-C, 237 mL at 04/30/22 1325   insulin aspart (novoLOG) injection 0-15 Units, 0-15 Units, Subcutaneous, TID WC, Simaan, Elizabeth S, PA-C, 3 Units at 05/01/22 0716   insulin aspart (novoLOG) injection 0-5 Units, 0-5 Units, Subcutaneous, QHS, Simaan, Elizabeth S, PA-C   isosorbide mononitrate (IMDUR) 24 hr tablet 60 mg, 60 mg, Oral, Daily, Simaan, Elizabeth S, PA-C, 60 mg at 05/01/22 0935   levothyroxine (SYNTHROID) tablet 75 mcg, 75 mcg, Oral, Q0600, Simaan, Elizabeth S, PA-C, 75 mcg at 05/01/22 0513   lidocaine (LIDODERM) 5 % 1 patch, 1 patch, Transdermal, Q24H, Simaan, Elizabeth S, PA-C, 1 patch at 05/01/22 0937   methocarbamol (ROBAXIN) tablet 1,000 mg, 1,000 mg, Oral, Q8H, Lovick, Ayesha N, MD, 1,000 mg at 05/01/22 0513   morphine (PF) 2 MG/ML injection 1-2 mg, 1-2 mg, Intravenous, Q4H PRN, Lovick, Ayesha N, MD, 2 mg at 04/30/22 1533   ondansetron (ZOFRAN-ODT) disintegrating tablet 4 mg, 4 mg, Oral, Q6H PRN **OR** ondansetron (ZOFRAN) injection 4 mg, 4 mg, Intravenous, Q6H PRN, Lovick, Ayesha N, MD, 4 mg at 04/24/22 1738   oxyCODONE (Oxy IR/ROXICODONE) immediate release tablet 2.5 mg, 2.5 mg, Oral, Q6H PRN, Simaan, Elizabeth S, PA-C   oxyCODONE (Oxy IR/ROXICODONE) immediate release tablet 5 mg, 5 mg, Oral, Q4H PRN, Simaan, Elizabeth S, PA-C, 5 mg at 05/01/22 0253   polyethylene glycol (MIRALAX / GLYCOLAX) packet 17 g, 17 g, Oral, Daily, Simaan,  Elizabeth S, PA-C, 17 g at 05/01/22 0934   sertraline (ZOLOFT) tablet 100 mg, 100 mg, Oral, Daily, Simaan, Elizabeth S, PA-C, 100 mg at 05/01/22 0935   timolol (TIMOPTIC) 0.25 % ophthalmic solution 1-2 drop, 1-2 drop, Both Eyes, BID, Simaan, Elizabeth S, PA-C  Facility-Administered Medications Ordered in Other Encounters:    aminophylline injection 75 mg, 75 mg, Intravenous, BID PRN, McLean, Dalton S, MD, 75 mg at 08/08/14 1335  Patients Current Diet:  Diet Order               Diet Carb Modified Fluid consistency: Thin; Room service appropriate? Yes  Diet effective now                   Precautions / Restrictions Precautions Precautions: Fall, Back Precaution Booklet Issued: Yes (comment) (Per PT) Precaution Comments: Pt with decreased recall of log rolling Spinal Brace: Thoracolumbosacral orthotic, Applied in sitting position Restrictions Weight Bearing Restrictions: No   Has the patient had 2 or more falls or a fall with injury in the past year? Yes  Prior Activity Level Community (5-7x/wk): fully independent, no DME, driving, retired  Prior Functional Level Self Care: Did the patient need help bathing, dressing, using the toilet or eating? Independent  Indoor Mobility: Did the patient need assistance with walking from room to room (with or without device)? Independent  Stairs: Did the patient need assistance with internal or external stairs (with or without device)? Independent  Functional Cognition: Did the patient need help planning regular tasks such as shopping or remembering to take medications? Independent  Patient Information Are you of Hispanic, Latino/a,or Spanish origin?: A. No, not of Hispanic, Latino/a, or Spanish origin What is your race?: A. White Do you need or want an interpreter to communicate with a doctor or health care staff?: 0. No  Patient's Response To:  Health Literacy and Transportation Is the patient able to respond to health literacy and  transportation needs?: Yes Health Literacy - How often do you need to have someone help you when you read instructions, pamphlets, or other written material from your doctor or pharmacy?: Never In the past 12 months, has lack of transportation kept you from medical appointments or from getting medications?: No In the past 12 months, has lack of transportation kept you from meetings, work, or from getting things needed for daily living?: No  Home Assistive Devices / Equipment Home Assistive Devices/Equipment: None Home Equipment: Rolling Walker (2 wheels), Wheelchair - manual, Shower seat  Prior Device Use: Indicate devices/aids used by the patient prior to current illness, exacerbation or injury? None of the above  Current Functional Level Cognition  Overall Cognitive Status: No family/caregiver present to determine baseline cognitive functioning Orientation Level: Oriented X4 General Comments: Poor recall of log rolling for precautions    Extremity Assessment (includes Sensation/Coordination)  Upper Extremity Assessment: Defer to OT evaluation RUE Deficits / Details: pain with shoulder ABD/flexion, slight decrease in ROM, still able to complete ADLs as needed RUE: Shoulder pain with ROM LUE Deficits / Details: pain with shoulder ABD/flexion, slight decrease in ROM, still able to complete ADLs as needed LUE: Shoulder pain with ROM  Lower Extremity Assessment: Generalized weakness (No focal deficits noted. Distal strength 5/5 with hallux ext, ankle DF/PF, knee flexion/ext.)    ADLs  Overall ADL's : Needs assistance/impaired Eating/Feeding: Set up Grooming: Sitting, Supervision/safety, Set up, Wash/dry face Upper Body Bathing: Minimal assistance Lower Body Bathing: Maximal assistance Upper Body Dressing : Minimal assistance Lower Body Dressing: Maximal assistance Toilet Transfer: Minimal assistance Toileting- Clothing Manipulation and Hygiene: Minimal assistance Tub/ Shower  Transfer: Minimal assistance Functional mobility during ADLs: Min guard, Rolling walker (2 wheels) General ADL Comments: Does well overall,    Mobility  Overal bed mobility: Needs Assistance Bed Mobility: Supine to Sit Rolling: Min assist Sidelying to sit: +2 for physical assistance, Mod assist Supine to sit: Mod assist, +2 for safety/equipment, HOB elevated Sit to sidelying: Mod assist General bed mobility comments: Pt left sitting in recliner    Transfers  Overall transfer level: Needs   assistance Equipment used: Rolling walker (2 wheels) Transfers: Sit to/from Stand Sit to Stand: Min assist Bed to/from chair/wheelchair/BSC transfer type:: Step pivot Step pivot transfers: From elevated surface, Min assist General transfer comment: From very elevated bed surface<>RW and RW<>BSC, initial verbal/tactile cues for safe UE placement within back precs; painful transition, but pt with fair eccentric control for stand>sit on BSC and on recliner chair (with pillows on seat). Pt with slightly improved foot clearance this date but still with small low steps and needs dense cues for posture, RW management and body mechanics.    Ambulation / Gait / Stairs / Wheelchair Mobility  Ambulation/Gait Ambulation/Gait assistance: Min assist Gait Distance (Feet): 6 Feet Assistive device: Rolling walker (2 wheels) Gait Pattern/deviations: Decreased stride length, Step-to pattern, Narrow base of support, Shuffle General Gait Details: Pt with slightly improved foot clearance this date but still with small low steps and needs dense cues for posture, RW management and body mechanics. Pt c/o increased severe pain after ~6ft so chair pulled up for pt to sit in, pt on 2L O2 [place] for gait task but weaned to RA per RN request once in recliner. HR 112-128 bpm with exertion and was 78 bpm resting. Pt placed on 2L O2 [place] for gait trial as she desat to SpO2 80-86% with pivot to BSC on RA and with shallow breaths. On 2L, SpO2  90% and greater but only ~6ft distance. Gait velocity: <0.2 m/s Gait velocity interpretation: <1.31 ft/sec, indicative of household ambulator    Posture / Balance Dynamic Sitting Balance Sitting balance - Comments: intermittent minA due to lateral lean with fatigue Balance Overall balance assessment: Needs assistance Sitting-balance support: No upper extremity supported, Feet supported Sitting balance-Leahy Scale: Fair Sitting balance - Comments: intermittent minA due to lateral lean with fatigue Standing balance support: Bilateral upper extremity supported, Reliant on assistive device for balance, During functional activity Standing balance-Leahy Scale: Poor Standing balance comment: RW reliant    Special needs/care consideration Oxygen 2L acutely and Diabetic management yes   Previous Home Environment (from acute therapy documentation) Living Arrangements: Spouse/significant other Available Help at Discharge: Family Type of Home: House Home Layout: One level Home Access: Stairs to enter Entrance Stairs-Rails: Left Entrance Stairs-Number of Steps: 1 Bathroom Shower/Tub: Tub/shower unit Home Care Services: No Additional Comments: Pt lives at home with husband, both independent, husband is able to assist as needed, retired.  Discharge Living Setting Plans for Discharge Living Setting: Patient's home, Lives with (comment) (spouse, William) Type of Home at Discharge: House Discharge Home Layout: One level Discharge Home Access: Stairs to enter Entrance Stairs-Rails: Left Entrance Stairs-Number of Steps: 1 Discharge Bathroom Shower/Tub: Tub/shower unit Discharge Bathroom Toilet: Standard Discharge Bathroom Accessibility: Yes How Accessible: Accessible via walker Does the patient have any problems obtaining your medications?: No  Social/Family/Support Systems Patient Roles: Spouse Anticipated Caregiver: William Evitt Anticipated Caregiver's Contact Information:  [phone] Ability/Limitations of Caregiver: none stated Caregiver Availability: 24/7 Discharge Plan Discussed with Primary Caregiver: Yes Is Caregiver In Agreement with Plan?: Yes Does Caregiver/Family have Issues with Lodging/Transportation while Pt is in Rehab?: No  Goals Patient/Family Goal for Rehab: PT/OT mod I, SLP n/a Expected length of stay: 6-9 days Additional Information: discharge plan: mod I goals, home with spouse who is available 24/7 Pt/Family Agrees to Admission and willing to participate: Yes Program Orientation Provided & Reviewed with Pt/Caregiver Including Roles  & Responsibilities: Yes  Decrease burden of Care through IP rehab admission: n/a  Possible need for SNF placement upon   discharge: Not anticipated.  Plan for mod I goals and d/c to pt's home with spouse.   Patient Condition: I have reviewed medical records from [place], spoken with CM, and patient and spouse. I met with patient at the bedside for inpatient rehabilitation assessment.  Patient will benefit from ongoing PT and OT, can actively participate in 3 hours of therapy a day 5 days of the week, and can make measurable gains during the admission.  Patient will also benefit from the coordinated team approach during an Inpatient Acute Rehabilitation admission.  The patient will receive intensive therapy as well as Rehabilitation physician, nursing, social worker, and care management interventions.  Due to safety, skin/wound care, disease management, medication administration, pain management, and patient education the patient requires 24 hour a day rehabilitation nursing.  The patient is currently min to mod assist with mobility and basic ADLs.  Discharge setting and therapy post discharge at home with home health is anticipated.  Patient has agreed to participate in the Acute Inpatient Rehabilitation Program and will admit today.  Preadmission Screen Completed By:  Niobe Dick E Lorenz Donley, PT, DPT 05/01/2022 10:28  AM ______________________________________________________________________   Discussed status with Dr. Swartz on 05/01/22  at 10:36 AM  and received approval for admission today.  Admission Coordinator:  Naz Denunzio E Kemarion Abbey, PT, DPT time 10:36 AM /Date 05/01/22    Assessment/Plan: Diagnosis: polytrauma after fall thru deck Does the need for close, 24 hr/day Medical supervision in concert with the patient's rehab needs make it unreasonable for this patient to be served in a less intensive setting? Yes Co-Morbidities requiring supervision/potential complications: left breast cancer s/p mastectomy, HTN, PAD, PE, DM Due to bladder management, bowel management, safety, skin/wound care, disease management, medication administration, pain management, and patient education, does the patient require 24 hr/day rehab nursing? Yes Does the patient require coordinated care of a physician, rehab nurse, PT, OT to address physical and functional deficits in the context of the above medical diagnosis(es)? Yes Addressing deficits in the following areas: balance, endurance, locomotion, strength, transferring, bowel/bladder control, bathing, dressing, feeding, grooming, toileting, and psychosocial support Can the patient actively participate in an intensive therapy program of at least 3 hrs of therapy 5 days a week? Yes The potential for patient to make measurable gains while on inpatient rehab is good Anticipated functional outcomes upon discharge from inpatient rehab: modified independent PT, modified independent OT, n/a SLP Estimated rehab length of stay to reach the above functional goals is: 6-9 days Anticipated discharge destination: Home 10. Overall Rehab/Functional Prognosis: excellent   MD Signature: Zachary T. Swartz, MD, FAAPMR [facility] Medical Director Rehabilitation Services 05/01/2022  

## 2022-05-01 NOTE — TOC Transition Note (Signed)
Transition of Care (TOC) - CM/SW Discharge Note Donn Pierini RN, BSN Transitions of Care Unit 4E- RN Case Manager See Treatment Team for direct phone # Covering Trauma  Patient Details  Name: Taylor [name] MRN: [id number] Date of Birth: [birth year]  Transition [facility] Care [facility]) CM/SW Contact:  Darrold Span, RN Phone Number: 05/01/2022, 12:14 PM   Clinical Narrative:    Notified by Julaine Fusi liaison that pt has bed available today for admit to INPT rehab here at [facility].  Trauma team has cleared pt for transition to INPT rehab. Pt will transition this afternoon to available bed.   CM notified liaison for [facility] that pt going to CIR, [facility] to follow for any [facility] needs post rehab stay.    Final next level of care: IP Rehab Facility Barriers to Discharge: Barriers Resolved   Patient Goals and CMS Choice CMS Medicare.gov Compare Post Acute Care list provided to:: Patient Choice offered to / list presented to : Patient  Discharge Placement               [facility] INPT rehab          Discharge Plan and Services Additional resources added to the After Visit Summary for     Discharge Planning Services: CM Consult Post Acute Care Choice: IP Rehab          DME Arranged: N/A DME Agency: NA       HH Arranged: PT, OT HH Agency: [facility] Date [facility] Agency Contacted: 04/27/22 Time HH Agency Contacted: 1601 Representative spoke with at [facility] Agency: Kandee Keen  Social Determinants of Health (SDOH) Interventions SDOH Screenings   Food Insecurity: No Food Insecurity (04/24/2022)  Housing: Low Risk  (04/24/2022)  Transportation Needs: No Transportation Needs (04/24/2022)  Utilities: Not At Risk (04/24/2022)  Tobacco Use: Medium Risk (04/25/2022)     Readmission Risk Interventions    05/01/2022   12:14 PM  Readmission Risk Prevention Plan  Post Dischage Appt Not Complete  Appt Comments going to INPT rehab first  Medication Screening Complete  Transportation Screening  Complete

## 2022-05-01 NOTE — Discharge Summary (Signed)
[facility] Discharge Summary   Patient ID: Taylor [name] MRN: [id number] DOB/AGE: 75-Jan-1947 75 y.o.  Admit date: 04/24/2022 Discharge date: 05/01/2022  Admitting Diagnosis: Fall from height Rib fractures Pneumothorax  AKI  T11 fracture  Transverse process fractures   Discharge Diagnosis Patient Active Problem List   Diagnosis Date Noted   Rib fractures 04/24/2022   Pain due to onychomycosis of toenails of both feet 04/09/2021   Diabetic neuropathy 04/09/2021   Type 2 diabetes mellitus with vascular disease 04/09/2021   Obesity (BMI 30-39.9) 01/16/2018   Mixed hyperlipidemia 09/21/2016   Carotid artery disease 12/03/2015   OSA (obstructive sleep apnea) 12/03/2015   Pain and swelling of left lower leg 11/17/2015   Coronary artery disease    Hypertensive heart disease    Type II diabetes mellitus    PAD (peripheral artery disease) 11/03/2014   Chest pain 08/06/2014   Nausea with vomiting 05/08/2014   Allergic reaction 05/08/2014   Hypercholesteremia    Hypertension    Diabetes mellitus (HCC)    Hx of tobacco use, presenting hazards to health 12/21/2013   Varicose veins of left lower extremity 12/18/2013   Cardiac enzymes elevated 03/01/2012   Chronic diastolic heart failure, NYHA class 1 03/01/2012   Bilateral pulmonary embolism 02/29/2012   History of breast cancer, DCIS, Left mastectomy 03/21/2009. 03/06/2011   Chest pain 11/30/2010   Left carotid bruit 02/26/2009   CHRONIC LEFT LEG VENOUS INSUFFICIENCY 07/02/2008   EDEMA 05/14/2008   HYPERCHOLESTEROLEMIA 05/10/2008   CAD, ARTERY BYPASS GRAFT 05/10/2008   ESOPHAGITIS, REFLUX 05/10/2008   DM (diabetes mellitus) type I uncontrolled, periph vascular disorder (HCC) 10/18/2006   DEPRESSION 10/18/2006   Essential hypertension 10/18/2006    Consultants Neurosurgery   Imaging: DG CHEST PORT 1 VIEW  Result Date: 04/30/2022 CLINICAL DATA:  Recent chest tube removal for right-sided pneumothorax. EXAM:  PORTABLE CHEST 1 VIEW COMPARISON:  04/30/2022 6:29 a.m. FINDINGS: The right pleural pigtail catheter is been removed. No current visible pneumothorax. Persistent bandlike right infrahilar density. Clips are noted in the right axilla. Stable retrocardiac opacity on the left with blunting of the left costophrenic angle. Prior median sternotomy. Atherosclerotic calcification of the aortic arch. Indistinctness of the pulmonary vasculature potentially reflecting pulmonary venous hypertension. Degenerative spurring of the humeral heads. IMPRESSION: 1. No pneumothorax status post right pleural pigtail catheter removal. 2. Stable retrocardiac opacity on the left with blunting of the left costophrenic angle. 3. Stable bandlike density in the right infrahilar region. 4. Indistinctness of the pulmonary vasculature potentially reflecting pulmonary venous hypertension or mild edema. Electronically Signed   By: Gaylyn Rong M.D.   On: 04/30/2022 14:45   DG CHEST PORT 1 VIEW  Result Date: 04/30/2022 CLINICAL DATA:  [id number] Chest tube in place [id number] Pneumothorax, right [id number] EXAM: PORTABLE CHEST 1 VIEW COMPARISON:  04/29/2022 FINDINGS: Right chest tube remains position near the right lung apex. No visible pneumothorax. Stable heart size status post sternotomy. Persistent small left pleural effusion with associated left basilar opacity. Right basilar atelectasis. Slightly improving aeration of the lung bases. IMPRESSION: 1. Right chest tube remains in place. No visible pneumothorax. 2. Slightly improving aeration of the lung bases. 3. Persistent small left pleural effusion with associated left basilar opacity. Electronically Signed   By: Duanne Guess D.O.   On: 04/30/2022 08:28    Procedures Dr. Bedelia Person 04/27/22 right pigtail chest tube  Hospital Course:  77 y/o F who presented to the ED after falling 8-10 feel through a  deck. Trauma  workup significant for the below injuries along with their management:    T11 vertebral body FX - NS c/s, Dr. Yetta Barre. Non-op mgmt. Plan upright films with no visible FX. Non-op mgmt with TLSO L1-3 TP FX  Right anterior PTX - gradual increase in size of PTX >> chest tube placed 4/15.  CT removed 4/18 and 4h follow up film negative for PTX. Wean O2. R Posterior Rib FX 11-12 - multimodal pain control  L hand abrasion - no underlying FX, chronic OA  ABL anemia - S/p 1 u pRBC 4/14 for hgb 7.5. hgb stabilizing. 8.5 AKI - creatinine 1.86 > 1.56 > 1.19 > 1.03 improved with gentle IVF and holding nephrotoxic agents. Fluids stopped.  Diabetes mellitus - SSI CAD, PAD - cardiologist Dr. Elease Hashimoto, ASA/plavix were held. On 4/19 they were resumed after hgb/hct stabilized, re-check CBC 4/20   FEN - Reg, SLIV, ensure VTE - SCD's, received first dose of lovenox 4/17 PM ID - given ancef and tdap in ED 4/12  On 05/01/22 vitals were stable, tolerating PO, mobilizing, pain controlled, working with therpaies and felt stable for [facility].  Would recommend follow up chest x-ray in 10-14 days.     Current Facility-Administered Medications:    acetaminophen (TYLENOL) tablet 1,000 mg, 1,000 mg, Oral, Q6H, Lovick, [name], MD, 1,000 mg at 05/01/22 0513   ALPRAZolam (XANAX) tablet 0.25 mg, 0.25 mg, Oral, Daily PRN, [name], PA-C   aspirin chewable tablet 81 mg, 81 mg, Oral, Daily, [name], PA-C, 81 mg at 05/01/22 1610   clopidogrel (PLAVIX) tablet 75 mg, 75 mg, Oral, Daily, [name], PA-C, 75 mg at 05/01/22 9604   docusate sodium (COLACE) capsule 100 mg, 100 mg, Oral, BID, [name], MD, 100 mg at 05/01/22 0935   enoxaparin (LOVENOX) injection 30 mg, 30 mg, Subcutaneous, Q12H, [name], PA-C, 30 mg at 04/30/22 2335   feeding supplement (ENSURE ENLIVE / ENSURE PLUS) liquid 237 mL, 237 mL, Oral, TID WC, [name], PA-C, 237 mL at 04/30/22 1325   insulin aspart (novoLOG) injection 0-15 Units, 0-15  Units, Subcutaneous, TID WC, [name], PA-C, 3 Units at 05/01/22 0716   insulin aspart (novoLOG) injection 0-5 Units, 0-5 Units, Subcutaneous, QHS, [name], PA-C   isosorbide mononitrate (IMDUR) 24 hr tablet 60 mg, 60 mg, Oral, Daily, Ezrah Dembeck S, PA-C, 60 mg at 05/01/22 0935   levothyroxine (SYNTHROID) tablet 75 mcg, 75 mcg, Oral, Q0600, [name], PA-C, 75 mcg at 05/01/22 0513   lidocaine (LIDODERM) 5 % 1 patch, 1 patch, Transdermal, Q24H, Rayanne Padmanabhan S, PA-C, 1 patch at 05/01/22 5409   methocarbamol (ROBAXIN) tablet 1,000 mg, 1,000 mg, Oral, Q8H, Lovick, [name], MD, 1,000 mg at 05/01/22 0513   morphine (PF) 2 MG/ML injection 1-2 mg, 1-2 mg, Intravenous, Q4H PRN, [name], MD, 2 mg at 04/30/22 1533   ondansetron (ZOFRAN-ODT) disintegrating tablet 4 mg, 4 mg, Oral, Q6H PRN **OR** ondansetron (ZOFRAN) injection 4 mg, 4 mg, Intravenous, Q6H PRN, [name], MD, 4 mg at 04/24/22 1738   oxyCODONE (Oxy IR/ROXICODONE) immediate release tablet 2.5 mg, 2.5 mg, Oral, Q6H PRN, Crue Otero S, PA-C   oxyCODONE (Oxy IR/ROXICODONE) immediate release tablet 5 mg, 5 mg, Oral, Q4H PRN, Brylin Stopper S, PA-C, 5 mg at 05/01/22 0253   polyethylene glycol (MIRALAX / GLYCOLAX) packet 17 g, 17 g, Oral, Daily,  Cohick, Francine Graven, PA-C,  17 g at 05/01/22 0934   sertraline (ZOLOFT) tablet 100 mg, 100 mg, Oral, Daily, Hanh Kertesz S, PA-C, 100 mg at 05/01/22 0935   timolol (TIMOPTIC) 0.25 % ophthalmic solution 1-2 drop, 1-2 drop, Both Eyes, BID, Abayomi Pattison S, PA-C  Facility-Administered Medications Ordered in Other Encounters:    aminophylline injection 75 mg, 75 mg, Intravenous, BID PRN, Laurey Morale, MD, 75 mg at 08/08/14 1335     Follow-up Information     Tia Alert, MD. Schedule an appointment as soon as possible for a visit in 2 week(s).   Specialty: Neurosurgery Contact information: [address] [place] [address] [phone]         [facility] Follow up.   Specialty: Home Health Services Why: Home health has been arranged. They will contact you to schedule apt within 1 to 2 days post discharge. Contact information: [address] [place] [address] [phone]         [name], MD Follow up.   Specialty: Family Medicine Contact information: [address] [place] [address] [phone]                 Signed: Hosie Spangle, [facility] 05/01/2022, 10:28 AM

## 2022-05-01 NOTE — H&P (Signed)
Physical Medicine and Rehabilitation Admission H&P        Chief Complaint  Patient presents with   Functional deficits due to polytrauma      HPI: Taylor [name] is a 76 year old female with history of CAD, PAD, PE, T2DM, anxiety/depression, recurrent Melanoma right arm, breast cancer who was admitted on 04/24/22 after falling 8-10 feet through a deck with multiple abrasions and reports of pain in back, neck and chest. No LOC and hypotensive enroute to the hospital and lactic acidosis noted at admission. She was treated with IVF and DAPT held per discussion with Dr. Elease Hashimoto. She was found to have R 11-12 th rib fractures, small right PTX,  acute nondisplaced T-11 vertebral body Fx w/extension into bridging osteophte, L1-L3 TVP FX as well as incidental findings of severe facet arthropathy L3/L4 with anterolisthesis of L3 on L4. Dr. Yetta Barre recommended upright films once mobile and this showed stable Fx without distraction or compression.  TLSO ordered and to be used when up and out of bed with recs for repeat films in 2 weeks and follow up in office. Hospital course significant for progressive SOB w/severe hypoxia with sats in 70's on 04/15 and found to have increasing R-PTX which was treated with CT placement. Respiratory status improved and she was gradually weaned off oxygen to RA yesterday.  CT removed on 04/18 and follow up CXR was negative for PTX with persistent bandlike infrahilar density on the right.    Hospital course also significant for AKI  with BUN/SCr- 34/1.86 which improved with gentle IVF, ABLA requiring one unit PRBC with repeat H/H at 8.5/25.1.  PT/OT has been working with patient who continues to be limited by pain, weakness, decreased posture with now BOS, SOB w/activity as well as mild dizziness. CIR recommended due to functional decline.      Review of Systems  Constitutional:  Negative for fever.  Eyes:  Negative for blurred vision and double vision.  Respiratory:  Positive  for shortness of breath. Negative for cough.   Cardiovascular:  Negative for chest pain and leg swelling.  Gastrointestinal:  Positive for constipation. Negative for heartburn and nausea.  Genitourinary:  Negative for dysuria and urgency.  Musculoskeletal:  Positive for back pain and myalgias.  Skin:  Negative for rash.  Neurological:  Positive for dizziness (some since fall), weakness and headaches (chronic HA).  Psychiatric/Behavioral:  The patient has insomnia (chronic --uses xanax prn). The patient is not nervous/anxious.             Past Medical History:  Diagnosis Date   Cancer of left breast     Complication of anesthesia     Coronary artery disease      a. 2006 s/p CABG x 4 (LIMA->LAD, VG->Diag->OM, VG->RPDA);  b. 02/2015: [place] s/p DES to [facility], VG->PDA & VG->D1->OM 100; c. 06/2015 Cath: diffuse dzs->Med rx; d. 11/2015 Cath/PCI: LM nl, LAD 100ost, 95d, D1 90ost, LCX 35ost, 65p, OM2 40, lat OM2 90, RCA 95p (2.75x16 Synergy DES), patent mid stent, RPLB 70, LIMA->LAD ok.EF55-65%.   Depression     Hx of tobacco use, presenting hazards to health      a. quit 2006.   Hypercholesteremia     Hypertension     Hypertensive heart disease      Echo 04/2019: Inferior HK, EF 55, normal RV SF, RVSP normal at 29.2, mild BAE, trivial MR, mild aortic valve sclerosis without stenosis   Left carotid bruit      a.  09/2014 Carotid U/S: 1-39% bilat ICA stenosis.   PAD (peripheral artery disease)      a. 11/2014 ABI: R: 0.63, L 0.59;  b. 11/2014 Periph Angio: bilat pop occlusions. L - short w/ reconstituion via collats in dist pop w/ 2 vessel runoff, R long w/ reconstitution in prox tib/peroneal arteries-->med Rx w/ pletal.   Pulmonary embolism a. 2014.   Reflux esophagitis     Type II diabetes mellitus             Past Surgical History:  Procedure Laterality Date   BREAST BIOPSY Left ~ 2012   BUNIONECTOMY Left 1970s   CARDIAC CATHETERIZATION   04/2004   CARDIAC CATHETERIZATION N/A 02/21/2015     Procedure: Left Heart Cath and Cors/Grafts Angiography;  Surgeon: Lyn Records, MD;  Location: [facility] INVASIVE CV LAB;  Service: Cardiovascular;  Laterality: N/A;   CARDIAC CATHETERIZATION N/A 02/21/2015    Procedure: Coronary Stent Intervention;  Surgeon: Lyn Records, MD;  Location: [facility] INVASIVE CV LAB;  Service: Cardiovascular;  Laterality: N/A;   CARDIAC CATHETERIZATION N/A 02/21/2015    Procedure: Intravascular Pressure Wire/FFR Study;  Surgeon: Lyn Records, MD;  Location: [facility] INVASIVE CV LAB;  Service: Cardiovascular;  Laterality: N/A;   CARDIAC CATHETERIZATION N/A 06/20/2015    Procedure: Left Heart Cath and Cors/Grafts Angiography;  Surgeon: Laurey Morale, MD;  Location: [facility] INVASIVE CV LAB;  Service: Cardiovascular;  Laterality: N/A;   CARDIAC CATHETERIZATION N/A 11/19/2015    Procedure: Left Heart Cath and Cors/Grafts Angiography;  Surgeon: Peter M Swaziland, MD;  Location: [facility] INVASIVE CV LAB;  Service: Cardiovascular;  Laterality: N/A;   CARDIAC CATHETERIZATION N/A 11/19/2015    Procedure: Coronary Stent Intervention;  Surgeon: Peter M [place]az[place], MD;  Location: [facility] INVASIVE CV LAB;  Service: Cardiovascular;  Laterality: N/A;   CATARACT EXTRACTION W/ INTRAOCULAR LENS  IMPLANT, BILATERAL Bilateral 1980s-1990s   CORONARY ANGIOPLASTY WITH STENT PLACEMENT   02/21/2015    "1 stent"   CORONARY ARTERY BYPASS GRAFT   05/2004    "CABG X4"   EYE SURGERY       LAPAROSCOPIC CHOLECYSTECTOMY   1990s   MASTECTOMY Left ~ 2012   PERIPHERAL VASCULAR CATHETERIZATION N/A 12/12/2014    Procedure: Abdominal Aortogram;  Surgeon: Iran Ouch, MD;  Location: [facility] INVASIVE CV LAB;  Service: Cardiovascular;  Laterality: N/A;   RETINAL DETACHMENT SURGERY Right 1990s           Family History  Problem Relation Age of Onset   Coronary artery disease Mother 1        status post CABG    Heart attack Father 96        deceased secondary to fatal  first myocardial infarction   Coronary artery disease Sister 27        alive  had bypass surgery   Cancer Sister     Deep vein thrombosis Cousin          Neice had after knee surgery      Social History:  Married. Independent PTA. 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 She reports that she quit smoking about 17 years ago. Her smoking use included cigarettes. She has a 7.50 pack-year smoking history. She has never used smokeless tobacco. She reports that she does not drink alcohol and does not use drugs.          Allergies  Allergen Reactions   Niacin Other (See Comments)  burning   Duloxetine        Other reaction(s): Other (See Comments)   Amoxicillin Rash   Ibuprofen Rash   Lipitor [Atorvastatin] Other (See Comments)      Muscle pain            Medications Prior to Admission  Medication Sig Dispense Refill   acetaminophen (TYLENOL) 500 MG tablet Take 1,000 mg by mouth every 6 (six) hours as needed for headache (pain).        ALPRAZolam (XANAX) 0.25 MG tablet Take 0.25 mg by mouth daily as needed for anxiety.        clopidogrel (PLAVIX) 75 MG tablet Take 1 tablet (75 mg total) by mouth daily. 30 tablet 11   glipiZIDE (GLUCOTROL) 5 MG tablet Take 10 mg by mouth daily.       isosorbide mononitrate (IMDUR) 60 MG 24 hr tablet Take 1 tablet by mouth once daily 90 tablet 3   levothyroxine (SYNTHROID) 75 MCG tablet Take 75 mcg by mouth daily before breakfast.       lisinopril (ZESTRIL) 10 MG tablet Take 10 mg by mouth daily.       metFORMIN (GLUCOPHAGE) 500 MG tablet Take 1,000 mg by mouth 2 (two) times daily with a meal.        rosuvastatin (CRESTOR) 20 MG tablet TAKE ONE TABLET BY MOUTH ONCE DAILY AT  6PM (Patient taking differently: Take 20 mg by mouth daily.) 90 tablet 3   sertraline (ZOLOFT) 100 MG tablet Take 100 mg by mouth daily.        timolol (BETIMOL) 0.25 % ophthalmic solution 1-2 drops 2 (two) times daily.       aspirin 81 MG tablet Take 81 mg by mouth daily. (Patient not taking: Reported on 04/27/2022)        nitroGLYCERIN (NITROSTAT) 0.4 MG SL tablet Place 1 tablet (0.4 mg total) under the tongue every 5 (five) minutes as needed for chest pain. (Patient not taking: Reported on 04/27/2022) 30 tablet 3   pioglitazone (ACTOS) 15 MG tablet Take 1 tablet by mouth daily. (Patient not taking: Reported on 04/21/2022)              Home: Home Living Family/patient expects to be discharged to:: Private residence Living Arrangements: Spouse/significant other Available Help at Discharge: Family Type of Home: House Home Access: Stairs to enter Secretary/administrator of Steps: 1 Entrance Stairs-Rails: Left Home Layout: One level Bathroom Shower/Tub: Tub/shower unit Home Equipment: Agricultural consultant (2 wheels), Wheelchair - manual, Shower seat Additional Comments: Pt lives at home with husband, both independent, husband is able to assist as needed, retired.   Functional History: Prior Function Prior Level of Function : Independent/Modified Independent Mobility Comments: independent, no AD ADLs Comments: independent   Functional Status:  Mobility: Bed Mobility Overal bed mobility: Needs Assistance Bed Mobility: Supine to Sit Rolling: Min assist Sidelying to sit: +2 for physical assistance, Mod assist Supine to sit: Mod assist, +2 for safety/equipment, HOB elevated Sit to sidelying: Mod assist General bed mobility comments: Pt left sitting in recliner Transfers Overall transfer level: Needs assistance Equipment used: Rolling walker (2 wheels) Transfers: Sit to/from Stand Sit to Stand: Min assist Bed to/from chair/wheelchair/BSC transfer type:: Step pivot Step pivot transfers: From elevated surface, Min assist General transfer comment: From very elevated bed surface<>RW and RW<>BSC, initial verbal/tactile cues for safe UE placement within back precs; painful transition, but pt with fair eccentric control for stand>sit on BSC and on recliner chair (with pillows  on seat). Pt with slightly improved foot  clearance this date but still with small low steps and needs dense cues for posture, RW management and body mechanics. Ambulation/Gait Ambulation/Gait assistance: Min assist Gait Distance (Feet): 6 Feet Assistive device: Rolling walker (2 wheels) Gait Pattern/deviations: Decreased stride length, Step-to pattern, Narrow base of support, Shuffle General Gait Details: Pt with slightly improved foot clearance this date but still with small low steps and needs dense cues for posture, RW management and body mechanics. Pt c/o increased severe pain after ~30ft so chair pulled up for pt to sit in, pt on 2L O2 [place] for gait task but weaned to RA per RN request once in recliner. HR 112-128 bpm with exertion and was 78 bpm resting. Pt placed on 2L O2 [place] for gait trial as she desat to SpO2 80-86% with pivot to BSC on RA and with shallow breaths. On 2L, SpO2 90% and greater but only ~76ft distance. Gait velocity: <0.2 m/s Gait velocity interpretation: <1.31 ft/sec, indicative of household ambulator   ADL: ADL Overall ADL's : Needs assistance/impaired Eating/Feeding: Set up Grooming: Sitting, Supervision/safety, Set up, Wash/dry face Upper Body Bathing: Minimal assistance Lower Body Bathing: Maximal assistance Upper Body Dressing : Minimal assistance Lower Body Dressing: Maximal assistance Toilet Transfer: Minimal assistance Toileting- Clothing Manipulation and Hygiene: Minimal assistance Tub/ Shower Transfer: Minimal assistance Functional mobility during ADLs: Min guard, Rolling walker (2 wheels) General ADL Comments: Does well overall,   Cognition: Cognition Overall Cognitive Status: No family/caregiver present to determine baseline cognitive functioning Orientation Level: Oriented X4 Cognition Arousal/Alertness: Awake/alert Behavior During Therapy: WFL for tasks assessed/performed Overall Cognitive Status: No family/caregiver present to determine baseline cognitive functioning General Comments: Poor  recall of log rolling for precautions     Blood pressure (!) 121/55, pulse 71, temperature 98.1 F (36.7 C), temperature source Oral, resp. rate 18, height 5' 7.5" (1.715 m), weight 80 kg, SpO2 93 %. Physical Exam Vitals and nursing note reviewed.  Constitutional:      Appearance: Normal appearance.     Comments: Up in chair with TLSO in place. ON 2 L oxygen per [place]--fatigued appearing.   HENT:     Head: Normocephalic.     Right Ear: External ear normal.     Nose: Nose normal.     Mouth/Throat:     Pharynx: Oropharynx is clear.  Eyes:     Extraocular Movements: Extraocular movements intact.     Pupils: Pupils are equal, round, and reactive to light.  Cardiovascular:     Rate and Rhythm: Normal rate and regular rhythm.     Heart sounds: No murmur heard.    No gallop.  Pulmonary:     Effort: Pulmonary effort is normal. No respiratory distress.     Breath sounds: Normal breath sounds. No wheezing.  Abdominal:     General: Bowel sounds are normal.     Palpations: Abdomen is soft.  Musculoskeletal:     Cervical back: Normal range of motion.     Right lower leg: No edema.     Left lower leg: No edema.     Comments: Pt in TLSO. Tender to palpation along right chest wall/axillary line. No tenderness in neck, arms, or lower ext  Skin:    Comments: Small abrasion right lower leg. Large ecchymoses on either arm.  Neurological:     Mental Status: She is alert and oriented to person, place, and time.     Comments: Speech clear. Able to follow simple motor  commands. Had difficulty raising prox LLE but is 4/5 distally. . LUE   slower to initiate due to back pain but generally 3-4/5. RUE 4/5. No sensory findings. Normal cognition and focus. Normal reflexes  Psychiatric:        Mood and Affect: Mood normal.        Behavior: Behavior normal.        Lab Results Last 48 Hours        Results for orders placed or performed during the hospital encounter of 04/24/22 (from the past 48 hour(s))   Glucose, capillary     Status: Abnormal    Collection Time: 04/29/22 11:14 AM  Result Value Ref Range    Glucose-Capillary 147 (H) 70 - 99 mg/dL      Comment: Glucose reference range applies only to samples taken after fasting for at least 8 hours.  Glucose, capillary     Status: Abnormal    Collection Time: 04/29/22  4:55 PM  Result Value Ref Range    Glucose-Capillary 178 (H) 70 - 99 mg/dL      Comment: Glucose reference range applies only to samples taken after fasting for at least 8 hours.  Glucose, capillary     Status: Abnormal    Collection Time: 04/29/22  9:30 PM  Result Value Ref Range    Glucose-Capillary 153 (H) 70 - 99 mg/dL      Comment: Glucose reference range applies only to samples taken after fasting for at least 8 hours.    Comment 1 Notify RN      Comment 2 Document in Chart    CBC     Status: Abnormal    Collection Time: 04/30/22  1:04 AM  Result Value Ref Range    WBC 10.5 4.0 - 10.5 K/uL    RBC 2.82 (L) 3.87 - 5.11 MIL/uL    Hemoglobin 8.5 (L) 12.0 - 15.0 g/dL    HCT 16.1 (L) 09.6 - 46.0 %    MCV 89.0 80.0 - 100.0 fL    MCH 30.1 26.0 - 34.0 pg    MCHC 33.9 30.0 - 36.0 g/dL    RDW 04.5 (H) 40.9 - 15.5 %    Platelets 251 150 - 400 K/uL    nRBC 0.3 (H) 0.0 - 0.2 %      Comment: Performed at [facility], [address], [place], [place] [address]  Basic metabolic panel     Status: Abnormal    Collection Time: 04/30/22  1:04 AM  Result Value Ref Range    Sodium 133 (L) 135 - 145 mmol/L    Potassium 4.0 3.5 - 5.1 mmol/L    Chloride 105 98 - 111 mmol/L    CO2 20 (L) 22 - 32 mmol/L    Glucose, Bld 160 (H) 70 - 99 mg/dL      Comment: Glucose reference range applies only to samples taken after fasting for at least 8 hours.    BUN 17 8 - 23 mg/dL    Creatinine, Ser 4.78 (H) 0.44 - 1.00 mg/dL    Calcium 8.1 (L) 8.9 - 10.3 mg/dL    GFR, Estimated 57 (L) >60 mL/min      Comment: (NOTE) Calculated using the CKD-EPI Creatinine Equation (2021)      Anion  gap 8 5 - 15      Comment: Performed at [facility], [address], [place], [place] [address]  Glucose, capillary     Status: Abnormal  Collection Time: 04/30/22  6:07 AM  Result Value Ref Range    Glucose-Capillary 140 (H) 70 - 99 mg/dL      Comment: Glucose reference range applies only to samples taken after fasting for at least 8 hours.    Comment 1 Notify RN      Comment 2 Document in Chart    Glucose, capillary     Status: Abnormal    Collection Time: 04/30/22 11:51 AM  Result Value Ref Range    Glucose-Capillary 164 (H) 70 - 99 mg/dL      Comment: Glucose reference range applies only to samples taken after fasting for at least 8 hours.  Glucose, capillary     Status: Abnormal    Collection Time: 04/30/22  4:40 PM  Result Value Ref Range    Glucose-Capillary 221 (H) 70 - 99 mg/dL      Comment: Glucose reference range applies only to samples taken after fasting for at least 8 hours.  Glucose, capillary     Status: Abnormal    Collection Time: 04/30/22  9:06 PM  Result Value Ref Range    Glucose-Capillary 175 (H) 70 - 99 mg/dL      Comment: Glucose reference range applies only to samples taken after fasting for at least 8 hours.    Comment 1 Notify RN      Comment 2 Document in Chart    Glucose, capillary     Status: Abnormal    Collection Time: 05/01/22  6:44 AM  Result Value Ref Range    Glucose-Capillary 159 (H) 70 - 99 mg/dL      Comment: Glucose reference range applies only to samples taken after fasting for at least 8 hours.    Comment 1 Notify RN      Comment 2 Document in Chart         Imaging Results (Last 48 hours)  DG CHEST PORT 1 VIEW   Result Date: 04/30/2022 CLINICAL DATA:  Recent chest tube removal for right-sided pneumothorax. EXAM: PORTABLE CHEST 1 VIEW COMPARISON:  04/30/2022 6:29 a.m. FINDINGS: The right pleural pigtail catheter is been removed. No current visible pneumothorax. Persistent bandlike right infrahilar density. Clips are noted in the  right axilla. Stable retrocardiac opacity on the left with blunting of the left costophrenic angle. Prior median sternotomy. Atherosclerotic calcification of the aortic arch. Indistinctness of the pulmonary vasculature potentially reflecting pulmonary venous hypertension. Degenerative spurring of the humeral heads. IMPRESSION: 1. No pneumothorax status post right pleural pigtail catheter removal. 2. Stable retrocardiac opacity on the left with blunting of the left costophrenic angle. 3. Stable bandlike density in the right infrahilar region. 4. Indistinctness of the pulmonary vasculature potentially reflecting pulmonary venous hypertension or mild edema. Electronically Signed   By: Gaylyn Rong M.D.   On: 04/30/2022 14:45    DG CHEST PORT 1 VIEW   Result Date: 04/30/2022 CLINICAL DATA:  [id number] Chest tube in place [id number] Pneumothorax, right [id number] EXAM: PORTABLE CHEST 1 VIEW COMPARISON:  04/29/2022 FINDINGS: Right chest tube remains position near the right lung apex. No visible pneumothorax. Stable heart size status post sternotomy. Persistent small left pleural effusion with associated left basilar opacity. Right basilar atelectasis. Slightly improving aeration of the lung bases. IMPRESSION: 1. Right chest tube remains in place. No visible pneumothorax. 2. Slightly improving aeration of the lung bases. 3. Persistent small left pleural effusion with associated left basilar opacity. Electronically Signed   By: Duanne Guess D.O.   On: 04/30/2022 08:28  Blood pressure (!) 121/55, pulse 71, temperature 98.1 F (36.7 C), temperature source Oral, resp. rate 18, height 5' 7.5" (1.715 m), weight 80 kg, SpO2 93 %.   Medical Problem List and Plan: 1. Functional deficits secondary to polytrauma including right 11-12th rib fx with PTX, T11 Vertebral body fx, lumbar spine fx's             -patient may shower with brace off and back supported.              -ELOS/Goals: 6-9 days, mod I  goals             -apply TLSO in sitting position 2.  Antithrombotics: -DVT/anticoagulation:  Pharmaceutical: Lovenox--will decrease to 40 mg daily             -antiplatelet therapy: DAPT resumed today (04/19). Recheck CBC in am.  3. Pain Management: D/c tylenol 1000 mg qid due to abnormal LFTs. Continue robaxin 1000 TID. Will schedule tramadol 50 mg qid.  --Oxycodone prn.  4. Mood/Behavior/Sleep: LCSW to follow for evaluation and support.              -antipsychotic agents: N/A             --Xanax prn as at home for insomnia.  5. Neuropsych/cognition: This patient is capable of making decisions on her own behalf. 6. Skin/Wound Care: Routine pressure relief measures.  7. Fluids/Electrolytes/Nutrition: Monitor I/O. Check CMET in am.  8. Right rib Fx/R-PTX:  Encourage pulmonary hygiene. Flutter valve added.             --SOB/DOE w/activity-->wean oxygen to off as tolerated.  9. T2DM: Hgb A1c- 7.6 and reasonably controlled. Was on glipizide and metformin PTA. --Monitor BS ac/hs and use SSI for elevated BS             --continue to hold metformin--?CKD. May need alternate medication.                          -f/u labs as they're available 10. Acute on chronic renal failure: BUN/SCr-21/1.4 at admission-->34/1.83-->17/1.03 today             --reports intake has been poor due to dental pain as well as recent surgery.              --Zestril was d/c 04/13. Encourage fluid intake.  11. CAD s/p CABG: Monitor for symptoms with increase in activity             --on Imdur and off Lisinopril for AKI. DAPT. Resumed. Continue to hold Crestor.  12. H/o depression w/ anxiety: Continue Zoloft w/ xanax prn.  --monitor sodium levels for further drop.  13. Abnormal LFTs: AST-876 and ALT 738 at admission likely due to shocked liver v/s excessive tylenol use PTA due to dental pain/procedure --Will d/c tylenol and recheck in am. 14. ABLA: Monitor H/H with serial checks. Did drop from 12.2-->8.5 -- Recheck CBC in  am. 15.  Hyponatremia: Recheck CMET in am.  16. OIC: On Miralax and colace-->increase miralax to bid as no BM since admission             --Sorbitol prn.   17. Recurrent melanoma s/p resection 02/2022: Completed Tx/on surveillance.   18. Hypothyroid: On supplement.  19. Code status: Dicussed w/patient who wants to be full code.          Jacquelynn Cree, PA-C 05/01/2022  I have personally performed a face to face  diagnostic evaluation of this patient and formulated the key components of the plan.  Additionally, I have personally reviewed laboratory data, imaging studies, as well as relevant notes and concur with the physician assistant's documentation above.  The patient's status has not changed from the original H&P.  Any changes in documentation from the acute care chart have been noted above.  Meredith Staggers, MD, [facility]rown

## 2022-05-01 NOTE — Progress Notes (Signed)
Physical Therapy Treatment Patient Details Name: Taylor [name] MRN: [id number] DOB: 1945/09/30 Today's Date: 05/01/2022   History of Present Illness Pt is 77 year old female admitted 4/12 after falling through a deck. She has suffered right-sided rib fxs and fx at T11, L1-3 TP FX and a soft tissue injury to the right flank. Right anterior PTX - gradual increase in size of pneumothorax, chest tube placed 4/15. No PTX on AM CXR, continue to -20cm suction 4/16, plan CXR 4/17. Pt has history of L breast cancer s/p mastectomy, removal of lymph nodes in RUE ~4 mos prior, hypertension, PAD, Pulmonary emobolism, and DMII.    PT Comments    Pt received in recliner, agreeable to therapy session and with good participation and tolerance for transfer and gait training as well as seated/reclined BLE exercises for strengthening. Pt more alert this date and continues to demonstrate decreased carryover of back precautions. Pt remains reliant on supplemental O2, needing up to 3L/min with exertional tasks, pt DOE improved slightly from previous session and she was able to progress to short household distance gait trial in room with RW and minA. Possible orthostatic symptoms (BLE weakness), BP 150/86 (103) reclined prior to gait trial and BP 127/105 (113)  reclined post-exertion. Pt pulling ~200-271mL on IS, encouraged hourly use. Pt continues to benefit from PT services to progress toward functional mobility goals.   Recommendations for follow up therapy are one component of a multi-disciplinary discharge planning process, led by the attending physician.  Recommendations may be updated based on patient status, additional functional criteria and insurance authorization.  Follow Up Recommendations       Assistance Recommended at Discharge Frequent or constant Supervision/Assistance  Patient can return home with the following Assistance with cooking/housework;Assist for transportation;Help with stairs or ramp for  entrance;A lot of help with bathing/dressing/bathroom;A lot of help with walking and/or transfers;Direct supervision/assist for medications management   Equipment Recommendations  Other (comment);None recommended by PT (TBD post-acute)    Recommendations for Other Services       Precautions / Restrictions Precautions Precautions: Fall;Back Precaution Booklet Issued: Yes (comment) Precaution Comments: Pt with decreased recall of back precautions Required Braces or Orthoses: Spinal Brace Spinal Brace: Thoracolumbosacral orthotic;Applied in sitting position Restrictions Weight Bearing Restrictions: No     Mobility  Bed Mobility Overal bed mobility: Needs Assistance             General bed mobility comments: Pt received in recliner and agreeable to remain up in chair at end of session.    Transfers Overall transfer level: Needs assistance Equipment used: Rolling walker (2 wheels) Transfers: Sit to/from Stand Sit to Stand: Min assist           General transfer comment: from chair<>RW, cues for advancing hips first to prevent excessive bend at hips, pt needs multimodal cues for proper technique.    Ambulation/Gait Ambulation/Gait assistance: Min assist Gait Distance (Feet): 35 Feet Assistive device: Rolling walker (2 wheels) Gait Pattern/deviations: Decreased stride length, Step-through pattern       General Gait Details: Pt received on RA but SpO2 89-90%, RN approval to place pt on 2L O2 [place]. On 2L, pt desat to 86-88% SpO2 so increased to 3L, pt SpO2 94% and above on 3L O2 [place] with exertion. Pt also c/o BLE feeling "weak" after short distance in room, possible orthostatic symptoms see BP in general comments. Good proximity to RW this date, pt needs postural cues.   Stairs  Wheelchair Mobility    Modified Rankin (Stroke Patients Only)       Balance Overall balance assessment: Needs assistance Sitting-balance support: No upper extremity  supported, Feet supported Sitting balance-Leahy Scale: Fair     Standing balance support: Bilateral upper extremity supported, Reliant on assistive device for balance, During functional activity Standing balance-Leahy Scale: Poor Standing balance comment: RW reliant                            Cognition Arousal/Alertness: Awake/alert Behavior During Therapy: WFL for tasks assessed/performed Overall Cognitive Status: No family/caregiver present to determine baseline cognitive functioning                                 General Comments: Pt initially unable to state any of her back precs. With delayed recall (~10 mins later), pt able to recall 1/3 precs with increased time. Once given acronym again, pt able to recall 2/3. Pt with improved processing speed this date and more aware, but still needing cues for safety, activity pacing and precautions with functional tasks.        Exercises General Exercises - Lower Extremity Ankle Circles/Pumps: AROM, Both, 10 reps, Supine Quad Sets: AROM, Both, 10 reps, Supine Gluteal Sets: AROM, 10 reps, Supine Long Arc Quad: AROM, Both, 10 reps, Seated Other Exercises Other Exercises: IS x 8 reps (pt achieves 200-250 mL)    General Comments General comments (skin integrity, edema, etc.): BP 150/86 (103) prior to standing (taken in LLE, recliner) HR 74 bpm; BP 127/105 (113) taken in LLE in recliner post-exertion, pt c/o BLE weakness prior to sitting and 6/10 modified RPE (fatigue).      Pertinent Vitals/Pain Pain Assessment Pain Assessment: 0-10 Pain Score: 3  Pain Location: Sternum and back with functional tasks; pt reports lesser mild pain at rest Pain Descriptors / Indicators: Aching, Guarding, Grimacing Pain Intervention(s): Limited activity within patient's tolerance, Monitored during session     PT Goals (current goals can now be found in the care plan section) Acute Rehab PT Goals Patient Stated Goal: To breathe  and move better, less pain so I can go home PT Goal Formulation: With patient Time For Goal Achievement: 05/09/22 Progress towards PT goals: Progressing toward goals    Frequency    Min 4X/week      PT Plan Current plan remains appropriate       AM-PAC PT "6 Clicks" Mobility   Outcome Measure  Help needed turning from your back to your side while in a flat bed without using bedrails?: A Little Help needed moving from lying on your back to sitting on the side of a flat bed without using bedrails?: A Lot Help needed moving to and from a bed to a chair (including a wheelchair)?: A Lot Help needed standing up from a chair using your arms (e.g., wheelchair or bedside chair)?: A Little Help needed to walk in hospital room?: A Lot Help needed climbing 3-5 steps with a railing? : Total 6 Click Score: 13    End of Session Equipment Utilized During Treatment: Gait belt;Oxygen;Back brace Activity Tolerance: Patient tolerated treatment well;Other (comment) (needs O2 with exertion) Patient left: in chair;with call bell/phone within reach;with chair alarm set Nurse Communication: Other (comment);Mobility status;Precautions (pt has hx bil lymph node removal in UE, may be better not to use BUE for BP readings) PT Visit Diagnosis: Unsteadiness on  feet (R26.81);Other abnormalities of gait and mobility (R26.89);Muscle weakness (generalized) (M62.81);Difficulty in walking, not elsewhere classified (R26.2);Pain Pain - part of body:  (ribs/back)     Time: 1610-9604 PT Time Calculation (min) (ACUTE ONLY): 36 min  Charges:  $Gait Training: 8-22 mins $Therapeutic Exercise: 8-22 mins                     Shyonna Carlin P., PTA Acute Rehabilitation Services Secure Chat Preferred 9a-5:30pm Office: [phone]    Dorathy Kinsman [facility] 05/01/2022, 1:50 PM

## 2022-05-02 DIAGNOSIS — R7401 Elevation of levels of liver transaminase levels: Secondary | ICD-10-CM

## 2022-05-02 DIAGNOSIS — E871 Hypo-osmolality and hyponatremia: Secondary | ICD-10-CM

## 2022-05-02 DIAGNOSIS — T1490XA Injury, unspecified, initial encounter: Secondary | ICD-10-CM

## 2022-05-02 DIAGNOSIS — E1165 Type 2 diabetes mellitus with hyperglycemia: Secondary | ICD-10-CM

## 2022-05-02 DIAGNOSIS — D62 Acute posthemorrhagic anemia: Secondary | ICD-10-CM | POA: Diagnosis not present

## 2022-05-02 LAB — CBC WITH DIFFERENTIAL/PLATELET
Abs Immature Granulocytes: 0.68 10*3/uL — ABNORMAL HIGH (ref 0.00–0.07)
Basophils Absolute: 0.1 10*3/uL (ref 0.0–0.1)
Basophils Relative: 1 %
Eosinophils Absolute: 0.3 10*3/uL (ref 0.0–0.5)
Eosinophils Relative: 3 %
HCT: 27.9 % — ABNORMAL LOW (ref 36.0–46.0)
Hemoglobin: 9.2 g/dL — ABNORMAL LOW (ref 12.0–15.0)
Immature Granulocytes: 7 %
Lymphocytes Relative: 16 %
Lymphs Abs: 1.5 10*3/uL (ref 0.7–4.0)
MCH: 29.1 pg (ref 26.0–34.0)
MCHC: 33 g/dL (ref 30.0–36.0)
MCV: 88.3 fL (ref 80.0–100.0)
Monocytes Absolute: 0.6 10*3/uL (ref 0.1–1.0)
Monocytes Relative: 6 %
Neutro Abs: 6.5 10*3/uL (ref 1.7–7.7)
Neutrophils Relative %: 67 %
Platelets: 341 10*3/uL (ref 150–400)
RBC: 3.16 MIL/uL — ABNORMAL LOW (ref 3.87–5.11)
RDW: 15.7 % — ABNORMAL HIGH (ref 11.5–15.5)
WBC: 9.7 10*3/uL (ref 4.0–10.5)
nRBC: 0.2 % (ref 0.0–0.2)

## 2022-05-02 LAB — COMPREHENSIVE METABOLIC PANEL
ALT: 60 U/L — ABNORMAL HIGH (ref 0–44)
AST: 41 U/L (ref 15–41)
Albumin: 2.5 g/dL — ABNORMAL LOW (ref 3.5–5.0)
Alkaline Phosphatase: 142 U/L — ABNORMAL HIGH (ref 38–126)
Anion gap: 10 (ref 5–15)
BUN: 19 mg/dL (ref 8–23)
CO2: 20 mmol/L — ABNORMAL LOW (ref 22–32)
Calcium: 8.4 mg/dL — ABNORMAL LOW (ref 8.9–10.3)
Chloride: 100 mmol/L (ref 98–111)
Creatinine, Ser: 0.94 mg/dL (ref 0.44–1.00)
GFR, Estimated: >60 mL/min (ref >60)
Glucose, Bld: 149 mg/dL — ABNORMAL HIGH (ref 70–99)
Potassium: 4.6 mmol/L (ref 3.5–5.1)
Sodium: 130 mmol/L — ABNORMAL LOW (ref 135–145)
Total Bilirubin: 0.8 mg/dL (ref 0.3–1.2)
Total Protein: 6.5 g/dL (ref 6.5–8.1)

## 2022-05-02 LAB — GLUCOSE, CAPILLARY
Glucose-Capillary: 160 mg/dL — ABNORMAL HIGH (ref 70–99)
Glucose-Capillary: 160 mg/dL — ABNORMAL HIGH (ref 70–99)
Glucose-Capillary: 135 mg/dL — ABNORMAL HIGH (ref 70–99)
Glucose-Capillary: 208 mg/dL — ABNORMAL HIGH (ref 70–99)

## 2022-05-02 MED ORDER — GLIPIZIDE 5 MG PO TABS
5.0000 mg | ORAL_TABLET | Freq: Every day | ORAL | Status: DC
  Administered 2022-05-02 – 2022-05-03 (×2): 5 mg via ORAL
  Filled 2022-05-02 (×2): qty 1

## 2022-05-02 NOTE — Progress Notes (Signed)
PROGRESS NOTE   Subjective/Complaints: Had a fair night. Still a bit uncomfortable d/t torso pain. Able to sleep. Ready for therapy today. Anxious to get home!  ROS: Patient denies fever, rash, sore throat, blurred vision, dizziness, nausea, vomiting, diarrhea, cough, shortness of breath or chest pain,  headache, or mood change.    Objective:   DG CHEST PORT 1 VIEW  Result Date: 04/30/2022 CLINICAL DATA:  Recent chest tube removal for right-sided pneumothorax. EXAM: PORTABLE CHEST 1 VIEW COMPARISON:  04/30/2022 6:29 a.m. FINDINGS: The right pleural pigtail catheter is been removed. No current visible pneumothorax. Persistent bandlike right infrahilar density. Clips are noted in the right axilla. Stable retrocardiac opacity on the left with blunting of the left costophrenic angle. Prior median sternotomy. Atherosclerotic calcification of the aortic arch. Indistinctness of the pulmonary vasculature potentially reflecting pulmonary venous hypertension. Degenerative spurring of the humeral heads. IMPRESSION: 1. No pneumothorax status post right pleural pigtail catheter removal. 2. Stable retrocardiac opacity on the left with blunting of the left costophrenic angle. 3. Stable bandlike density in the right infrahilar region. 4. Indistinctness of the pulmonary vasculature potentially reflecting pulmonary venous hypertension or mild edema. Electronically Signed   By: Gaylyn Rong M.D.   On: 04/30/2022 14:45   Recent Labs    04/30/22 0104 05/02/22 0534  WBC 10.5 9.7  HGB 8.5* 9.2*  HCT 25.1* 27.9*  PLT 251 341   Recent Labs    04/30/22 0104 05/02/22 0534  NA 133* 130*  K 4.0 4.6  CL 105 100  CO2 20* 20*  GLUCOSE 160* 149*  BUN 17 19  CREATININE 1.03* 0.94  CALCIUM 8.1* 8.4*    Intake/Output Summary (Last 24 hours) at 05/02/2022 0957 Last data filed at 05/01/2022 1900 Gross per 24 hour  Intake 118 ml  Output --  Net 118 ml         Physical Exam: Vital Signs Blood pressure (!) 153/58, pulse (!) 59, temperature (!) 97.4 F (36.3 C), resp. rate 18, height 5' 7.5" (1.715 m), weight 86.3 kg, SpO2 96 %.  General: Alert and oriented x 3, No apparent distress HEENT: Head is normocephalic, atraumatic, PERRLA, EOMI, sclera anicteric, oral mucosa pink and moist, dentition intact, ext ear canals clear, O2 [place]ck: Supple without JVD or lymphadenopathy Heart: Reg rate and rhythm. No murmurs rubs or gallops Chest: CTA bilaterally without wheezes, rales, or rhonchi; no distress Abdomen: Soft, non-tender, non-distended, bowel sounds positive. Extremities: No clubbing, cyanosis, or edema. Pulses are 2+ Psych: Pt's affect is appropriate. Pt is cooperative Skin: Clean and intact without signs of breakdown. Abrasion RLE, bruising on either arm Neuro:  Alert and oriented x 3. Normal insight and awareness. Intact Memory. Normal language and speech. Cranial nerve exam unremarkable. MMT: UE limited by chest/back pain but at least 4/5. LE 3- prox to 4/5 distally. No sensory findings. Normal tone. .   Musculoskeletal: back and chest wall discomfort with palpation and rom.     Assessment/Plan: 1. Functional deficits which require 3+ hours per day of interdisciplinary therapy in a comprehensive inpatient rehab setting. Physiatrist is providing close team supervision and 24 hour management of active medical problems listed below. Physiatrist  and rehab team continue to assess barriers to discharge/monitor patient progress toward functional and medical goals  Care Tool:  Bathing    Body parts bathed by patient: Right arm, Left arm, Chest, Abdomen, Front perineal area, Right upper leg, Left upper leg, Right lower leg, Left lower leg, Face     Body parts n/a: Buttocks   Bathing assist Assist Level: Minimal Assistance - Patient > 75%     Upper Body Dressing/Undressing Upper body dressing   What is the patient wearing?: Pull over  shirt    Upper body assist Assist Level: Set up assist    Lower Body Dressing/Undressing Lower body dressing      What is the patient wearing?: Underwear/pull up, Pants     Lower body assist Assist for lower body dressing: Contact Guard/Touching assist     Toileting Toileting    Toileting assist Assist for toileting: Contact Guard/Touching assist     Transfers Chair/bed transfer  Transfers assist     Chair/bed transfer assist level: Contact Guard/Touching assist     Locomotion Ambulation   Ambulation assist              Walk 10 feet activity   Assist           Walk 50 feet activity   Assist           Walk 150 feet activity   Assist           Walk 10 feet on uneven surface  activity   Assist           Wheelchair     Assist               Wheelchair 50 feet with 2 turns activity    Assist            Wheelchair 150 feet activity     Assist          Blood pressure (!) 153/58, pulse (!) 59, temperature (!) 97.4 F (36.3 C), resp. rate 18, height 5' 7.5" (1.715 m), weight 86.3 kg, SpO2 96 %.  Medical Problem List and Plan: 1. Functional deficits secondary to polytrauma including right 11-12th rib fx with PTX, T11 Vertebral body fx, lumbar spine fx's             -patient may shower with brace off and back supported.              -ELOS/Goals: 6-9 days, mod I goals             -apply TLSO in sitting position  -Patient is beginning CIR therapies today including PT and OT  2.  Antithrombotics: -DVT/anticoagulation:  Pharmaceutical: Lovenox--will decrease to 40 mg daily             -antiplatelet therapy: DAPT resumed today (04/19). Recheck CBC in am.  3. Pain Management: D/c tylenol 1000 mg qid due to abnormal LFTs. Continue robaxin 1000 TID.    -scheduled tramadol 50 mg qid.  --Oxycodone prn.  4. Mood/Behavior/Sleep: LCSW to follow for evaluation and support.              -antipsychotic agents: N/A              --Xanax prn as at home for insomnia.  5. Neuropsych/cognition: This patient is capable of making decisions on her own behalf. 6. Skin/Wound Care: Routine pressure relief measures.  7. Fluids/Electrolytes/Nutrition: encourage PO  I personally reviewed the patient's labs today.  4/20-mild hyponatremia 130--recheck Monday  8. Right rib Fx/R-PTX:  Encourage pulmonary hygiene. Flutter valve added.             --SOB/DOE w/activity- ->wean oxygen to off as tolerated.  9. T2DM: Hgb A1c- 7.6 and reasonably controlled. Was on glipizide and metformin PTA. --Monitor BS ac/hs and use SSI for elevated BS CBG (last 3)  Recent Labs    05/01/22 1645 05/01/22 2124 05/02/22 0559  GLUCAP 194* 158* 160*   -resume low dose glipizide 4/20             --continue to hold metformin--                         10. Acute on chronic renal failure: BUN/SCr-21/1.4 at admission-->34/1.83-->17/1.03 today             --reports intake has been poor due to dental pain as well as recent surgery.              --Zestril was d/c 04/13.    -labs improved 4/20 11. CAD s/p CABG: Monitor for symptoms with increase in activity             --on Imdur and off Lisinopril for AKI. DAPT. Resumed. Continue to hold Crestor.  12. H/o depression w/ anxiety: Continue Zoloft w/ xanax prn.  --monitor sodium levels for further drop.  13. Abnormal LFTs: AST-876 and ALT 738 at admission likely due to shocked liver v/s excessive tylenol use PTA due to dental pain/procedure --4/20 LFT's substantially improved today -can resume tylenol as needed 14. ABLA: Monitor H/H with serial checks. Did drop from 12.2-->8.5 -- 4/20 hgb up to 9.2, stable 16. OIC: On Miralax and colace-->increase miralax to bid as no BM since admission             --Sorbitol prn.    -large bm 4/19 17. Recurrent melanoma s/p resection 02/2022: Completed Tx/on surveillance.   18. Hypothyroid: On supplement.  19. Code status: Dicussed w/patient who wants to be  full code.     LOS: 1 days A FACE TO FACE EVALUATION WAS PERFORMED  Ranelle Oyster 05/02/2022, 9:57 AM

## 2022-05-02 NOTE — Plan of Care (Signed)
  Problem: RH Balance Goal: LTG Patient will maintain dynamic sitting balance (PT) Description: LTG:  Patient will maintain dynamic sitting balance with assistance during mobility activities (PT) Flowsheets (Taken 05/02/2022 1835) LTG: Pt will maintain dynamic sitting balance during mobility activities with:: Independent with assistive device  Goal: LTG Patient will maintain dynamic standing balance (PT) Description: LTG:  Patient will maintain dynamic standing balance with assistance during mobility activities (PT) Flowsheets (Taken 05/02/2022 1835) LTG: Pt will maintain dynamic standing balance during mobility activities with:: Supervision/Verbal cueing   Problem: Sit to Stand Goal: LTG:  Patient will perform sit to stand with assistance level (PT) Description: LTG:  Patient will perform sit to stand with assistance level (PT) Flowsheets (Taken 05/02/2022 1835) LTG: PT will perform sit to stand in preparation for functional mobility with assistance level: Independent with assistive device   Problem: RH Bed Mobility Goal: LTG Patient will perform bed mobility with assist (PT) Description: LTG: Patient will perform bed mobility with assistance, with/without cues (PT). Flowsheets (Taken 05/02/2022 1835) LTG: Pt will perform bed mobility with assistance level of: Independent with assistive device    Problem: RH Bed to Chair Transfers Goal: LTG Patient will perform bed/chair transfers w/assist (PT) Description: LTG: Patient will perform bed to chair transfers with assistance (PT). Flowsheets (Taken 05/02/2022 1835) LTG: Pt will perform Bed to Chair Transfers with assistance level: Independent with assistive device    Problem: RH Car Transfers Goal: LTG Patient will perform car transfers with assist (PT) Description: LTG: Patient will perform car transfers with assistance (PT). Flowsheets (Taken 05/02/2022 1835) LTG: Pt will perform car transfers with assist:: Supervision/Verbal cueing    Problem: RH Ambulation Goal: LTG Patient will ambulate in controlled environment (PT) Description: LTG: Patient will ambulate in a controlled environment, # of feet with assistance (PT). Flowsheets (Taken 05/02/2022 1835) LTG: Pt will ambulate in controlled environ  assist needed:: Supervision/Verbal cueing LTG: Ambulation distance in controlled environment: 151ft using LRAD Goal: LTG Patient will ambulate in home environment (PT) Description: LTG: Patient will ambulate in home environment, # of feet with assistance (PT). Flowsheets (Taken 05/02/2022 1835) LTG: Pt will ambulate in home environ  assist needed:: Supervision/Verbal cueing LTG: Ambulation distance in home environment: 80ft using LRAD   Problem: RH Stairs Goal: LTG Patient will ambulate up and down stairs w/assist (PT) Description: LTG: Patient will ambulate up and down # of stairs with assistance (PT) Flowsheets (Taken 05/02/2022 1835) LTG: Pt will ambulate up/down stairs assist needed:: Supervision/Verbal cueing LTG: Pt will  ambulate up and down number of stairs: 1 step using LRAD or HRs per home set-up

## 2022-05-02 NOTE — Plan of Care (Signed)
  Problem: Consults Goal: RH GENERAL PATIENT EDUCATION Description: See Patient Education module for education specifics. Outcome: Progressing   Problem: RH BOWEL ELIMINATION Goal: RH STG MANAGE BOWEL WITH ASSISTANCE Description: STG Manage Bowel with mod I Assistance. Outcome: Progressing Goal: RH STG MANAGE BOWEL W/MEDICATION W/ASSISTANCE Description: STG Manage Bowel with Medication with mod I Assistance. Outcome: Progressing   Problem: RH SAFETY Goal: RH STG ADHERE TO SAFETY PRECAUTIONS W/ASSISTANCE/DEVICE Description: STG Adhere to Safety Precautions With cues Assistance/Device. Outcome: Progressing   Problem: RH PAIN MANAGEMENT Goal: RH STG PAIN MANAGED AT OR BELOW PT'S PAIN GOAL Description: < 4 with prns Outcome: Progressing   Problem: RH KNOWLEDGE DEFICIT GENERAL Goal: RH STG INCREASE KNOWLEDGE OF SELF CARE AFTER HOSPITALIZATION Description: Patient and spouse will be able to manage care at discharge using educational resources independently Outcome: Progressing

## 2022-05-02 NOTE — Evaluation (Signed)
Physical Therapy Assessment and Plan  Patient Details  Name: Taylor [name] MRN: [id number] Date of Birth: 01/01/1946  PT Diagnosis: Abnormal posture, Abnormality of gait, Difficulty walking, Impaired sensation, Muscle weakness, and Pain in ribs and back Rehab Potential: Good ELOS: 7-10 days   Today's Date: 05/02/2022 PT Individual Time: 1535-1701 PT Individual Time Calculation (min): 86 min    Hospital Problem: Principal Problem:   Trauma   Past Medical History:  Past Medical History:  Diagnosis Date   Cancer of left breast    Complication of anesthesia    Coronary artery disease    a. 2006 s/p CABG x 4 (LIMA->LAD, VG->Diag->OM, VG->RPDA);  b. 02/2015: [place] s/p DES to [facility], VG->PDA & VG->D1->OM 100; c. 06/2015 Cath: diffuse dzs->Med rx; d. 11/2015 Cath/PCI: LM nl, LAD 100ost, 95d, D1 90ost, LCX 35ost, 65p, OM2 40, lat OM2 90, RCA 95p (2.75x16 Synergy DES), patent mid stent, RPLB 70, LIMA->LAD ok.EF55-65%.   Depression    Hx of tobacco use, presenting hazards to health    a. quit 2006.   Hypercholesteremia    Hypertension    Hypertensive heart disease    Echo 04/2019: Inferior HK, EF 55, normal RV SF, RVSP normal at 29.2, mild BAE, trivial MR, mild aortic valve sclerosis without stenosis   Left carotid bruit    a. 09/2014 Carotid U/S: 1-39% bilat ICA stenosis.   PAD (peripheral artery disease)    a. 11/2014 ABI: R: 0.63, L 0.59;  b. 11/2014 Periph Angio: bilat pop occlusions. L - short w/ reconstituion via collats in dist pop w/ 2 vessel runoff, R long w/ reconstitution in prox tib/peroneal arteries-->med Rx w/ pletal.   Pulmonary embolism a. 2014.   Reflux esophagitis    Type II diabetes mellitus    Past Surgical History:  Past Surgical History:  Procedure Laterality Date   BREAST BIOPSY Left ~ 2012   BUNIONECTOMY Left 1970s   CARDIAC CATHETERIZATION  04/2004   CARDIAC CATHETERIZATION N/A 02/21/2015   Procedure: Left Heart Cath and Cors/Grafts Angiography;  Surgeon: Lyn Records, MD;  Location: [facility] INVASIVE CV LAB;  Service: Cardiovascular;  Laterality: N/A;   CARDIAC CATHETERIZATION N/A 02/21/2015   Procedure: Coronary Stent Intervention;  Surgeon: Lyn Records, MD;  Location: [facility] INVASIVE CV LAB;  Service: Cardiovascular;  Laterality: N/A;   CARDIAC CATHETERIZATION N/A 02/21/2015   Procedure: Intravascular Pressure Wire/FFR Study;  Surgeon: Lyn Records, MD;  Location: [facility] INVASIVE CV LAB;  Service: Cardiovascular;  Laterality: N/A;   CARDIAC CATHETERIZATION N/A 06/20/2015   Procedure: Left Heart Cath and Cors/Grafts Angiography;  Surgeon: Laurey Morale, MD;  Location: [facility] INVASIVE CV LAB;  Service: Cardiovascular;  Laterality: N/A;   CARDIAC CATHETERIZATION N/A 11/19/2015   Procedure: Left Heart Cath and Cors/Grafts Angiography;  Surgeon: Peter M [place], MD;  Location: [facility] INVASIVE CV LAB;  Service: Cardiovascular;  Laterality: N/A;   CARDIAC CATHETERIZATION N/A 11/19/2015   Procedure: Coronary Stent Intervention;  Surgeon: Peter M [place], MD;  Location: [facility] INVASIVE CV LAB;  Service: Cardiovascular;  Laterality: N/A;   CATARACT EXTRACTION W/ INTRAOCULAR LENS  IMPLANT, BILATERAL Bilateral 1980s-1990s   CORONARY ANGIOPLASTY WITH STENT PLACEMENT  02/21/2015   "1 stent"   CORONARY ARTERY BYPASS GRAFT  05/2004   "CABG X4"   EYE SURGERY     LAPAROSCOPIC CHOLECYSTECTOMY  1990s   MASTECTOMY Left ~ 2012   PERIPHERAL VASCULAR CATHETERIZATION N/A 12/12/2014   Procedure: Abdominal Aortogram;  Surgeon: Iran Ouch, MD;  Location:  [facility] INVASIVE CV LAB;  Service: Cardiovascular;  Laterality: N/A;   RETINAL DETACHMENT SURGERY Right 1990s    Assessment & Plan Clinical Impression: Patient is a 77 y.o. year old female with history of CAD, PAD, PE, T2DM, anxiety/depression, recurrent Melanoma right arm, breast cancer who was admitted on 04/24/22 after falling 8-10 feet through a deck with multiple abrasions and reports of pain in back, neck and chest. No LOC and hypotensive enroute to  the hospital and lactic acidosis noted at admission. She was treated with IVF and DAPT held per discussion with Dr. Elease Hashimoto. She was found to have R 11-12 th rib fractures, small right PTX,  acute nondisplaced T-11 vertebral body Fx w/extension into bridging osteophte, L1-L3 TVP FX as well as incidental findings of severe facet arthropathy L3/L4 with anterolisthesis of L3 on L4. Dr. Yetta Barre recommended upright films once mobile and this showed stable Fx without distraction or compression.  TLSO ordered and to be used when up and out of bed with recs for repeat films in 2 weeks and follow up in office. Hospital course significant for progressive SOB w/severe hypoxia with sats in 70's on 04/15 and found to have increasing R-PTX which was treated with CT placement. Respiratory status improved and she was gradually weaned off oxygen to RA yesterday.  CT removed on 04/18 and follow up CXR was negative for PTX with persistent bandlike infrahilar density on the right.    Hospital course also significant for AKI  with BUN/SCr- 34/1.86 which improved with gentle IVF, ABLA requiring one unit PRBC with repeat H/H at 8.5/25.1.  PT/OT has been working with patient who continues to be limited by pain, weakness, decreased posture with now BOS, SOB w/activity as well as mild dizziness. CIR recommended due to functional decline. Patient transferred to CIR on 05/01/2022 .   Patient currently requires min assist with mobility secondary to muscle weakness, decreased cardiorespiratoy endurance and decreased oxygen support, and decreased standing balance, decreased postural control, decreased balance strategies, and difficulty maintaining precautions.  Prior to hospitalization, patient was independent  with mobility and lived with Spouse (husband, [name]) in a House home.  Home access is 1 platform + 1 step-up into the houseStairs to enter.  Patient will benefit from skilled PT intervention to maximize safe functional mobility,  minimize fall risk, and decrease caregiver burden for planned discharge home with 24 hour supervision.  Anticipate patient will benefit from follow up OP at discharge.  PT - End of Session Activity Tolerance: Tolerates 30+ min activity with multiple rests Endurance Deficit: Yes Endurance Deficit Description: requires seated rest break PT Assessment Rehab Potential (ACUTE/IP ONLY): Good PT Patient demonstrates impairments in the following area(s): Balance;Safety;Sensory;Skin Integrity;Endurance;Motor;Nutrition;Pain PT Transfers Functional Problem(s): Bed Mobility;Bed to Chair;Car;Furniture PT Locomotion Functional Problem(s): Ambulation;Stairs PT Plan PT Intensity: Minimum of 1-2 x/day ,45 to 90 minutes PT Frequency: 5 out of 7 days PT Duration Estimated Length of Stay: 7-10 days PT Treatment/Interventions: Ambulation/gait training;Community reintegration;DME/adaptive equipment instruction;Neuromuscular re-education;Psychosocial support;Stair training;UE/LE Strength taining/ROM;Balance/vestibular training;Discharge planning;Pain management;Skin care/wound management;UE/LE Coordination activities;Therapeutic Activities;Cognitive remediation/compensation;Disease management/prevention;Functional mobility training;Patient/family education;Splinting/orthotics;Therapeutic Exercise;Visual/perceptual remediation/compensation PT Transfers Anticipated Outcome(s): mod-I using LRAD PT Locomotion Anticipated Outcome(s): supervision using LRAD PT Recommendation Follow Up Recommendations: Outpatient PT;24 hour supervision/assistance Patient destination: Home Equipment Recommended: To be determined   PT Evaluation Precautions/Restrictions Precautions Precautions: Fall;Back;Other (comment) Precaution Booklet Issued:  (written spine precautions provided) Precaution Comments: needs reinforcement of education on spine precautions and application to mobility tasks, monitor SpO2 maintain >90% Required Braces  or Orthoses:  Spinal Brace Spinal Brace: Thoracolumbosacral orthotic;Applied in sitting position Restrictions Weight Bearing Restrictions: No Pain Pain Assessment Pain Scale: 0-10 Pain Score: 3  (0/10 to 1/10 at rest in bed increases to 3/10 with activity) Pain Type: Acute pain Pain Location: Other (Comment) (back/posterior ribs) Pain Orientation: Right;Lower;Posterior Pain Descriptors / Indicators: Sharp Pain Onset: With Activity Pain Intervention(s): Medication (See eMAR);Rest;RN made aware;Emotional support;Distraction Pain Interference Pain Interference Pain Effect on Sleep: 1. Rarely or not at all Pain Interference with Therapy Activities: 1. Rarely or not at all Pain Interference with Day-to-Day Activities: 1. Rarely or not at all Home Living/Prior Functioning Home Living Available Help at Discharge: Family;Available 24 hours/day (husband is in good health, no AD to ambulate) Type of Home: House Home Access: Stairs to enter [employer] of Steps: 1 platform + 1 step-up into the house Entrance Stairs-Rails: Left (on platform) Home Layout: One level Additional Comments: Pt lives at home with husband, both independent, husband is able to assist as needed, retired. Pt reports some decrease in her acitvity level since cancer diganosis likely due to feeling down.  Lives With: Spouse (husband, [name]) Prior Function Level of Independence: Independent with homemaking with ambulation;Independent with gait;Independent with transfers  Able to Take Stairs?: Yes Driving: Yes Vocation: Retired Optometrist - History Ability to See in Adequate Light: 0 Adequate Vision - Assessment Additional Comments: reports decreased acuity in R eye with blurring since starting chemo medication but no acute changes after the fall Perception Perception: Within Functional Limits Praxis Praxis: Intact  Cognition  Overall Cognitive Status: Within Functional Limits for tasks  assessed Arousal/Alertness: Awake/alert Orientation Level: Oriented X4 Year: 2024 Month: April Day of Week: Correct Attention: Focused;Sustained Focused Attention: Appears intact Sustained Attention: Appears intact Awareness: Appears intact Safety/Judgment: Appears intact Sensation Sensation Light Touch: Impaired Detail Peripheral sensation comments: pt reports hx of neuropathy diagnosis with numbness/tingling in lower legs especially at night Light Touch Impaired Details: Impaired RLE;Impaired LLE Hot/Cold: Not tested Proprioception: Appears Intact Stereognosis: Not tested Coordination Gross Motor Movements are Fluid and Coordinated: No Fine Motor Movements are Fluid and Coordinated: Yes Coordination and Movement Description: Decreased fluidity due to increased pain and generalized weakness/deconditioning Motor  Motor Motor: Other (comment) Motor - Skilled Clinical Observations: Generalized weakness and deconditioning with guarded movements due to pain   Trunk/Postural Assessment  Cervical Assessment Cervical Assessment: Within Functional Limits Thoracic Assessment Thoracic Assessment: Exceptions to WFL (TLSO) Lumbar Assessment Lumbar Assessment: Exceptions to WFL (TLSO) Postural Control Postural Control: Deficits on evaluation (using RW for balance support)  Balance Balance Balance Assessed: Yes Static Sitting Balance Static Sitting - Balance Support: Feet supported Static Sitting - Level of Assistance: 6: Modified independent (Device/Increase time) Dynamic Sitting Balance Dynamic Sitting - Balance Support: Feet supported Dynamic Sitting - Level of Assistance: 5: Stand by assistance Static Standing Balance Static Standing - Balance Support: During functional activity Static Standing - Level of Assistance: Other (comment) (CGA) Dynamic Standing Balance Dynamic Standing - Balance Support: During functional activity;Left upper extremity supported Dynamic Standing -  Level of Assistance: 4: Min assist Extremity Assessment  RLE Assessment RLE Assessment: Exceptions to [facility] Active Range of Motion (AROM) Comments: WFL General Strength Comments: assessed in supine RLE Strength Right Hip Flexion: 4-/5 (possibly some guarding due to fear of pain) Right Knee Flexion: 4-/5 (possibly some guarding due to fear of pain) Right Knee Extension: 5/5 Right Ankle Dorsiflexion: 5/5 Right Ankle Plantar Flexion: 5/5 LLE Assessment LLE Assessment: Exceptions to [facility] Active Range of Motion (AROM)  Comments: WFL General Strength Comments: assessed in supine LLE Strength Left Hip Flexion: 4/5 Left Knee Flexion: 4+/5 Left Knee Extension: 4+/5 Left Ankle Dorsiflexion: 5/5 Left Ankle Plantar Flexion: 5/5  Care Tool Care Tool Bed Mobility Roll left and right activity   Roll left and right assist level: Minimal Assistance - Patient > 75%    Sit to lying activity   Sit to lying assist level: Minimal Assistance - Patient > 75%    Lying to sitting on side of bed activity   Lying to sitting on side of bed assist level: the ability to move from lying on the back to sitting on the side of the bed with no back support.: Minimal Assistance - Patient > 75%     Care Tool Transfers Sit to stand transfer   Sit to stand assist level: Minimal Assistance - Patient > 75%    Chair/bed transfer   Chair/bed transfer assist level: Minimal Assistance - Patient > 75%     Psychologist, clinical transfer assist level: Minimal Assistance - Patient > 75%      Care Tool Locomotion Ambulation   Assist level: Minimal Assistance - Patient > 75% Assistive device: Hand held assist    Walk 10 feet activity   Assist level: Minimal Assistance - Patient > 75% Assistive device: Hand held assist   Walk 50 feet with 2 turns activity   Assist level: Minimal Assistance - Patient > 75% Assistive device: Hand held assist  Walk 150 feet activity Walk 150 feet activity did  not occur: Safety/medical concerns      Walk 10 feet on uneven surfaces activity Walk 10 feet on uneven surfaces activity did not occur: Safety/medical concerns      Stairs   Assist level: Minimal Assistance - Patient > 75% Stairs assistive device: 2 hand rails Max number of stairs: 4  Walk up/down 1 step activity   Walk up/down 1 step (curb) assist level: Minimal Assistance - Patient > 75% Walk up/down 1 step or curb assistive device: 2 hand rails  Walk up/down 4 steps activity   Walk up/down 4 steps assist level: Minimal Assistance - Patient > 75% Walk up/down 4 steps assistive device: 2 hand rails  Walk up/down 12 steps activity Walk up/down 12 steps activity did not occur: Safety/medical concerns      Pick up small objects from floor   Pick up small object from the floor assist level: Total Assistance - Patient < 25% (pt with poor understanding of how to maintain back precautions)    Wheelchair Is the patient using a wheelchair?: Yes (only for transport) Type of Wheelchair: Manual   Wheelchair assist level: Dependent - Patient 0%    Wheel 50 feet with 2 turns activity   Assist Level: Dependent - Patient 0%  Wheel 150 feet activity   Assist Level: Dependent - Patient 0%    Refer to Care Plan for Long Term Goals  SHORT TERM GOAL WEEK 1 PT Short Term Goal 1 (Week 1): = to LTGs based on ELOS  Recommendations for other services: None   Skilled Therapeutic Intervention Pt received supine in bed and agreeable to therapy session. Evaluation completed (see details above) with patient education regarding purpose of PT evaluation, PT POC and goals, therapy schedule, weekly team meetings, and other CIR information including safety plan and fall risk safety. Pt performed the below functional mobility tasks with the specified levels of skilled  cuing and assistance. Pt received and maintained on 1L of O2 via nasal cannula during session with vitals assessed as below. Therapist providing  extensive education on spine precautions throughout session with pt initially able to recall 2/3 but with difficulty applying them to functional mobility tasks. Extensive education on logroll technique. Therapist ensured pt's understanding of how to use incentive spirometer and pt demonstrated understanding x4 reps with pt only able to reach right below line - reinforced education on doing this every hour. Pt holds her breath with all movements requiring cuing to take slow, calm breaths - pt reports she does this because she is afraid she will have a sudden sharp pain when she moves, but this does not occur throughout session. At end of session, pt agreeable to remain sitting up in recliner to increase upright, OOB activity tolerance and increase depth of her breathing. Pt left sitting with needs in reach, lines intact, TLSO in place, nurse aware of pt's position, and chair pad alarm on.  Vitals:  At rest supine in bed: SpO2 96% and HR 59bpm Sitting EOB: SpO2 93-94% and HR 77 After 1st gait: SpO2 90-93% and HR 88bpm After 2nd gait: Spo2 92% and HR 92bpm    Mobility Bed Mobility Bed Mobility: Supine to Sit;Sit to Supine Supine to Sit: Minimal Assistance - Patient > 75% (via logroll requiring use of HOB elevation and bedrail for pain management) Sit to Supine: Minimal Assistance - Patient > 75% (via reverse logroll requiring use of HOB elevation and bedrail for pain management) Transfers Transfers: Sit to Stand;Stand to Sit;Stand Pivot Transfers Sit to Stand: Minimal Assistance - Patient > 75% Stand to Sit: Minimal Assistance - Patient > 75% Stand Pivot Transfers: Minimal Assistance - Patient > 75% Stand Pivot Transfer Details: Verbal cues for sequencing;Verbal cues for technique;Verbal cues for precautions/safety;Tactile cues for initiation;Tactile cues for sequencing;Tactile cues for posture;Tactile cues for weight shifting;Visual cues/gestures for sequencing Transfer (Assistive device): 1  person hand held assist Locomotion  Gait Ambulation: Yes Gait Assistance: Minimal Assistance - Patient > 75% Gait Distance (Feet): 30 Feet (+ 96ft) Assistive device: 1 person hand held assist (L HHA) Gait Assistance Details: Tactile cues for sequencing;Tactile cues for weight shifting;Verbal cues for sequencing;Verbal cues for technique;Verbal cues for gait pattern;Manual facilitation for weight shifting Gait Gait: Yes Gait Pattern: Impaired Gait Pattern: Decreased step length - right;Decreased step length - left;Decreased stride length;Narrow base of support (slow movements with very short steps and decreased foot clearance; guarded upper body posturing) Gait velocity: significantly decreased Stairs / Additional Locomotion Stairs: Yes Stairs Assistance: Contact Guard/Touching assist;Minimal Assistance - Patient > 75% Stair Management Technique: Two rails;Forwards;Step to pattern (leading R LE on ascent and L LE descent) Number of Stairs: 4 Height of Stairs: 6 Wheelchair Mobility Wheelchair Mobility: No   Discharge Criteria: Patient will be discharged from PT if patient refuses treatment 3 consecutive times without medical reason, if treatment goals not met, if there is a change in medical status, if patient makes no progress towards goals or if patient is discharged from hospital.  The above assessment, treatment plan, treatment alternatives and goals were discussed and mutually agreed upon: by patient  Ginny Forth , PT, DPT, NCS, CSRS 05/02/2022, 3:54 PM

## 2022-05-02 NOTE — Evaluation (Signed)
Occupational Therapy Assessment and Plan  Patient Details  Name: Taylor [name] MRN: [id number] Date of Birth: Sep 22, 1945  OT Diagnosis: acute pain, muscle weakness (generalized), and decreased activity tolerance Rehab Potential: Rehab Potential (ACUTE ONLY): Good ELOS: 7-10 days   Today's Date: 05/02/2022 OT Individual Time: 0800-0910 OT Individual Time Calculation (min): 70 min     Today's Date: 05/02/2022 OT Individual Time: 1015-1110 OT Individual Time Calculation (min): 55 min     Hospital Problem: Principal Problem:   Trauma   Past Medical History:  Past Medical History:  Diagnosis Date   Cancer of left breast    Complication of anesthesia    Coronary artery disease    a. 2006 s/p CABG x 4 (LIMA->LAD, VG->Diag->OM, VG->RPDA);  b. 02/2015: [place] s/p DES to [facility], VG->PDA & VG->D1->OM 100; c. 06/2015 Cath: diffuse dzs->Med rx; d. 11/2015 Cath/PCI: LM nl, LAD 100ost, 95d, D1 90ost, LCX 35ost, 65p, OM2 40, lat OM2 90, RCA 95p (2.75x16 Synergy DES), patent mid stent, RPLB 70, LIMA->LAD ok.EF55-65%.   Depression    Hx of tobacco use, presenting hazards to health    a. quit 2006.   Hypercholesteremia    Hypertension    Hypertensive heart disease    Echo 04/2019: Inferior HK, EF 55, normal RV SF, RVSP normal at 29.2, mild BAE, trivial MR, mild aortic valve sclerosis without stenosis   Left carotid bruit    a. 09/2014 Carotid U/S: 1-39% bilat ICA stenosis.   PAD (peripheral artery disease)    a. 11/2014 ABI: R: 0.63, L 0.59;  b. 11/2014 Periph Angio: bilat pop occlusions. L - short w/ reconstituion via collats in dist pop w/ 2 vessel runoff, R long w/ reconstitution in prox tib/peroneal arteries-->med Rx w/ pletal.   Pulmonary embolism a. 2014.   Reflux esophagitis    Type II diabetes mellitus    Past Surgical History:  Past Surgical History:  Procedure Laterality Date   BREAST BIOPSY Left ~ 2012   BUNIONECTOMY Left 1970s   CARDIAC CATHETERIZATION  04/2004   CARDIAC  CATHETERIZATION N/A 02/21/2015   Procedure: Left Heart Cath and Cors/Grafts Angiography;  Surgeon: Lyn Records, MD;  Location: [facility] INVASIVE CV LAB;  Service: Cardiovascular;  Laterality: N/A;   CARDIAC CATHETERIZATION N/A 02/21/2015   Procedure: Coronary Stent Intervention;  Surgeon: Lyn Records, MD;  Location: [facility] INVASIVE CV LAB;  Service: Cardiovascular;  Laterality: N/A;   CARDIAC CATHETERIZATION N/A 02/21/2015   Procedure: Intravascular Pressure Wire/FFR Study;  Surgeon: Lyn Records, MD;  Location: [facility] INVASIVE CV LAB;  Service: Cardiovascular;  Laterality: N/A;   CARDIAC CATHETERIZATION N/A 06/20/2015   Procedure: Left Heart Cath and Cors/Grafts Angiography;  Surgeon: Laurey Morale, MD;  Location: [facility] INVASIVE CV LAB;  Service: Cardiovascular;  Laterality: N/A;   CARDIAC CATHETERIZATION N/A 11/19/2015   Procedure: Left Heart Cath and Cors/Grafts Angiography;  Surgeon: Peter M [place], MD;  Location: [facility] INVASIVE CV LAB;  Service: Cardiovascular;  Laterality: N/A;   CARDIAC CATHETERIZATION N/A 11/19/2015   Procedure: Coronary Stent Intervention;  Surgeon: Peter M [place], MD;  Location: [facility] INVASIVE CV LAB;  Service: Cardiovascular;  Laterality: N/A;   CATARACT EXTRACTION W/ INTRAOCULAR LENS  IMPLANT, BILATERAL Bilateral 1980s-1990s   CORONARY ANGIOPLASTY WITH STENT PLACEMENT  02/21/2015   "1 stent"   CORONARY ARTERY BYPASS GRAFT  05/2004   "CABG X4"   EYE SURGERY     LAPAROSCOPIC CHOLECYSTECTOMY  1990s   MASTECTOMY Left ~ 2012   PERIPHERAL VASCULAR CATHETERIZATION  N/A 12/12/2014   Procedure: Abdominal Aortogram;  Surgeon: Iran Ouch, MD;  Location: [facility] INVASIVE CV LAB;  Service: Cardiovascular;  Laterality: N/A;   RETINAL DETACHMENT SURGERY Right 1990s    Assessment & Plan Clinical Impression: Patient  is a 77 year old female with history of CAD, PAD, PE, T2DM, anxiety/depression, recurrent Melanoma right arm, breast cancer who was admitted on 04/24/22 after falling 8-10 feet through a deck  with multiple abrasions and reports of pain in back, neck and chest. No LOC and hypotensive enroute to the hospital and lactic acidosis noted at admission. She was treated with IVF and DAPT held per discussion with Dr. Elease Hashimoto. She was found to have R 11-12 th rib fractures, small right PTX,  acute nondisplaced T-11 vertebral body Fx w/extension into bridging osteophte, L1-L3 TVP FX as well as incidental findings of severe facet arthropathy L3/L4 with anterolisthesis of L3 on L4. Dr. Yetta Barre recommended upright films once mobile and this showed stable Fx without distraction or compression.  TLSO ordered and to be used when up and out of bed with recs for repeat films in 2 weeks and follow up in office. Hospital course significant for progressive SOB w/severe hypoxia with sats in 70's on 04/15 and found to have increasing R-PTX which was treated with CT placement. Respiratory status improved and she was gradually weaned off oxygen to RA yesterday.  CT removed on 04/18 and follow up CXR was negative for PTX with persistent bandlike infrahilar density on the right.    Hospital course also significant for AKI  with BUN/SCr- 34/1.86 which improved with gentle IVF, ABLA requiring one unit PRBC with repeat H/H at 8.5/25.1. Patient transferred to CIR on 05/01/2022 .    Patient currently requires CGA-min A with basic self-care skills secondary to muscle weakness and decreased cardiorespiratoy endurance.  Prior to hospitalization, patient could complete BADLs/IADLs with modified independent .  Patient will benefit from skilled intervention to increase independence with basic self-care skills and increase level of independence with iADL prior to discharge home with care partner.  Anticipate patient will require intermittent supervision and follow up outpatient.  OT - End of Session Activity Tolerance: Tolerates < 10 min activity with changes in vital signs Endurance Deficit: Yes OT Assessment Rehab Potential (ACUTE  ONLY): Good OT Barriers to Discharge: New oxygen OT Patient demonstrates impairments in the following area(s): Balance;Endurance;Pain OT Basic ADL's Functional Problem(s): Bathing;Dressing;Toileting OT Advanced ADL's Functional Problem(s): Simple Meal Preparation;Light Housekeeping OT Transfers Functional Problem(s): Toilet;Tub/Shower OT Plan OT Intensity: Minimum of 1-2 x/day, 45 to 90 minutes OT Frequency: 5 out of 7 days OT Duration/Estimated Length of Stay: 7-10 days OT Treatment/Interventions: Balance/vestibular training;Community reintegration;Discharge planning;DME/adaptive equipment instruction;Functional mobility training;Pain management;Patient/family education;Self Care/advanced ADL retraining;Therapeutic Activities;Therapeutic Exercise;UE/LE Coordination activities OT Basic Self-Care Anticipated Outcome(s): Mod I OT Toileting Anticipated Outcome(s): Mod I OT Bathroom Transfers Anticipated Outcome(s): Mod I OT Recommendation Patient destination: Home Follow Up Recommendations: Outpatient OT Equipment Recommended: To be determined  OT Evaluation Precautions/Restrictions  Precautions Precautions: Fall;Back Precaution Comments: Pt requires increased time to recall lifting, twisting, but unable to remember bending. Required Braces or Orthoses: Spinal Brace Spinal Brace: Thoracolumbosacral orthotic;Applied in sitting position Restrictions Weight Bearing Restrictions: No General Chart Reviewed: Yes Family/Caregiver Present: No Home Living/Prior Functioning Home Living Family/patient expects to be discharged to:: Private residence Living Arrangements: Spouse/significant other Available Help at Discharge: Family, Available 24 hours/day Type of Home: House Home Access: Stairs to enter [employer] of Steps: 1 Entrance Stairs-Rails: Left Home  Layout: One level Bathroom Shower/Tub: Engineer, manufacturing systems: Handicapped height Bathroom Accessibility: Yes   Lives With: Spouse IADL History Homemaking Responsibilities: Yes Meal Prep Responsibility: Secondary Laundry Responsibility: Secondary Cleaning Responsibility: Secondary Bill Paying/Finance Responsibility: Secondary Current License: Yes Occupation: Retired Leisure and Hobbies: Puzzles, gardening, crochet Prior Function Level of Independence: Independent with basic ADLs, Independent with homemaking with ambulation, Independent with gait (Pt owns rollator, RW, and WC.) Vision Baseline Vision/History: 1 Wears glasses Ability to See in Adequate Light: 0 Adequate Patient Visual Report: Blurring of vision;Eye fatigue/eye pain/headache Vision Assessment?: Vision impaired- to be further tested in functional context Perception  Perception: Within Functional Limits Praxis Praxis: Intact Cognition Cognition Overall Cognitive Status: Within Functional Limits for tasks assessed Arousal/Alertness: Awake/alert Orientation Level: Person;Place;Situation Awareness: Appears intact Problem Solving: Appears intact Safety/Judgment: Appears intact Brief Interview for Mental Status (BIMS) Repetition of Three Words (First Attempt): 3 Temporal Orientation: Year: Correct Temporal Orientation: Month: Accurate within 5 days Temporal Orientation: Day: Correct Recall: "Sock": No, could not recall Recall: "Blue": Yes, no cue required Recall: "Bed": No, could not recall BIMS Summary Score: 11 Sensation Sensation Light Touch: Appears Intact Coordination Gross Motor Movements are Fluid and Coordinated: No Fine Motor Movements are Fluid and Coordinated: Yes Coordination and Movement Description: Decreased fluidity due to increased pain and generalized weakness. Motor  Motor Motor: Within Functional Limits  Trunk/Postural Assessment  Cervical Assessment Cervical Assessment: Exceptions to [facility] (Forward head) Thoracic Assessment Thoracic Assessment: Exceptions to [facility] (Rounded shoulders) Lumbar  Assessment Lumbar Assessment: Within Functional Limits Postural Control Postural Control: Within Functional Limits  Balance Balance Balance Assessed: Yes Static Standing Balance Static Standing - Balance Support: Bilateral upper extremity supported;During functional activity Static Standing - Level of Assistance: 5: Stand by assistance (Supervision) Extremity/Trunk Assessment RUE Assessment RUE Assessment: Within Functional Limits LUE Assessment LUE Assessment: Within Functional Limits  Care Tool Care Tool Self Care Eating   Eating Assist Level: Independent    Oral Care    Oral Care Assist Level: Independent with assistive device    Bathing   Body parts bathed by patient: Right arm;Left arm;Chest;Abdomen;Front perineal area;Right upper leg;Left upper leg;Right lower leg;Left lower leg;Face   Body parts n/a: Buttocks Assist Level: Minimal Assistance - Patient > 75%    Upper Body Dressing(including orthotics)   What is the patient wearing?: Pull over shirt   Assist Level: Set up assist    Lower Body Dressing (excluding footwear)   What is the patient wearing?: Underwear/pull up;Pants Assist for lower body dressing: Contact Guard/Touching assist    Putting on/Taking off footwear   What is the patient wearing?: Non-skid slipper socks Assist for footwear: Contact Guard/Touching assist       Care Tool Toileting Toileting activity   Assist for toileting: Contact Guard/Touching assist     Care Tool Bed Mobility Roll left and right activity    Defer to PT Evaluation    Sit to lying activity    Defer to PT Evaluation    Lying to sitting on side of bed activity   Lying to sitting on side of bed assist level: the ability to move from lying on the back to sitting on the side of the bed with no back support.: Supervision/Verbal cueing     Care Tool Transfers Sit to stand transfer   Sit to stand assist level: Minimal Assistance - Patient > 75%    Chair/bed transfer    Chair/bed transfer assist level: Contact Guard/Touching assist     Toilet transfer  Assist Level: Contact Guard/Touching assist     Care Tool Cognition  Expression of Ideas and Wants Expression of Ideas and Wants: 4. Without difficulty (complex and basic) - expresses complex messages without difficulty and with speech that is clear and easy to understand  Understanding Verbal and Non-Verbal Content Understanding Verbal and Non-Verbal Content: 4. Understands (complex and basic) - clear comprehension without cues or repetitions   Memory/Recall Ability Memory/Recall Ability : Current season;That he or she is in a hospital/hospital unit   Refer to Care Plan for Long Term Goals  SHORT TERM GOAL WEEK 1 OT Short Term Goal 1 (Week 1): STGs=LTGs due to patient's length of stay.  Recommendations for other services: None    Skilled Therapeutic Intervention  Session 1: Pt received resting in bed for skilled OT session with focus on comprehensive OT evaluation and BADL retraining. Pt agreeable to interventions, demonstrating overall pleasant mood. Pt with un-rated pain. OT offering intermediate rest breaks and positioning suggestions throughout session to address pain/fatigue and maximize participation/safety in session.   Session began with introduction to OT role, OT POC, and general orientation to rehab unit/schedule. Pt completes full body sponge-bathing with assist levels noted below. Pt requires intermediate cuing to follow back precautions, stating "it's just hard. . . " Pt benefits from cuing for pursed-lipped breathing for pain/fatigue management, as well as, consumption of oxygen.   Pt's received/maintained on 1L O2 [place].   Pt remained sitting in recliner with all immediate needs met at end of session. Pt continues to be appropriate for skilled OT intervention to promote further functional independence.   ADL ADL Eating: Independent Where Assessed-Eating: Bed level Grooming:  Supervision/safety Where Assessed-Grooming: Standing at sink Upper Body Bathing: Setup Where Assessed-Upper Body Bathing: Edge of bed Lower Body Bathing: Minimal assistance Where Assessed-Lower Body Bathing: Edge of bed Upper Body Dressing: Setup Where Assessed-Upper Body Dressing: Edge of bed Lower Body Dressing: Minimal assistance Where Assessed-Lower Body Dressing: Edge of bed Toileting: Minimal assistance Where Assessed-Toileting: Bedside Commode Toilet Transfer: Furniture conservator/restorer Method: Proofreader: Extra wide bedside commode Tub/Shower Transfer: Contact guard Tub/Shower Transfer Method: Ambulating Tub/Shower Equipment: Shower seat without back;Grab bars ADL Comments: Pt owns shower seat. Mobility  Bed Mobility Bed Mobility: Supine to Sit Supine to Sit: Supervision/Verbal cueing Transfers Sit to Stand: Minimal Assistance - Patient > 75% Stand to Sit: Contact Guard/Touching assist  Session 2: Pt received sitting in recliner for skilled OT session with focus on functional ambulation and transfers. Pt agreeable to interventions, demonstrating overall pleasant mood. Pt with un-rated pain, stating "it's nothing too bad. . ." OT offering intermediate rest breaks and positioning suggestions throughout session to address pain/fatigue and maximize participation/safety in session.   Pt performs all STS transfers with CGA-Min A + RW, requiring heavier A for lower surfaces and with fatigue. Pt ambulates from room > nurses station with CGA + RW, but with decreased speed and requiring cuing for pursed-lipped breathing. Pt dependent for transport from nurses station>tub room for energy conservation. In tub room, ambulates into/out of bathroom, completing stand-step tub transfer with CGA + grab bars, using shower seat. Pt educated on the benefits of intalling a grab bar and long-handled shower head at home.   Pt's received/maintained on 1L O2 [place], with SPO2  readings ranging between 94-96, recovering with seated rest-breaks.   Pt completes bed mobility with supervision and increased time. Pt's TLSO adjusted to improve fit and overall comfort around neck area.   Pt  remained resting in bed with all immediate needs met at end of session. Pt continues to be appropriate for skilled OT intervention to promote further functional independence.    Discharge Criteria: Patient will be discharged from OT if patient refuses treatment 3 consecutive times without medical reason, if treatment goals not met, if there is a change in medical status, if patient makes no progress towards goals or if patient is discharged from hospital.  The above assessment, treatment plan, treatment alternatives and goals were discussed and mutually agreed upon: by patient  Lou Cal, OTR/L, MSOT  05/02/2022, 9:15 AM

## 2022-05-02 NOTE — Plan of Care (Signed)
  Problem: RH Balance Goal: LTG Patient will maintain dynamic standing with ADLs (OT) Description: LTG:  Patient will maintain dynamic standing balance with assist during activities of daily living (OT)  Flowsheets (Taken 05/02/2022 1252) LTG: Pt will maintain dynamic standing balance during ADLs with: Independent with assistive device   Problem: RH Bathing Goal: LTG Patient will bathe all body parts with assist levels (OT) Description: LTG: Patient will bathe all body parts with assist levels (OT) Flowsheets (Taken 05/02/2022 1252) LTG: Pt will perform bathing with assistance level/cueing: Independent with assistive device    Problem: RH Dressing Goal: LTG Patient will perform lower body dressing w/assist (OT) Description: LTG: Patient will perform lower body dressing with assist, with/without cues in positioning using equipment (OT) Flowsheets (Taken 05/02/2022 1252) LTG: Pt will perform lower body dressing with assistance level of: Independent with assistive device   Problem: RH Toileting Goal: LTG Patient will perform toileting task (3/3 steps) with assistance level (OT) Description: LTG: Patient will perform toileting task (3/3 steps) with assistance level (OT)  Flowsheets (Taken 05/02/2022 1252) LTG: Pt will perform toileting task (3/3 steps) with assistance level: Independent with assistive device   Problem: RH Simple Meal Prep Goal: LTG Patient will perform simple meal prep w/assist (OT) Description: LTG: Patient will perform simple meal prep with assistance, with/without cues (OT). Flowsheets (Taken 05/02/2022 1252) LTG: Pt will perform simple meal prep with assistance level of: Independent with assistive device   Problem: RH Light Housekeeping Goal: LTG Patient will perform light housekeeping w/assist (OT) Description: LTG: Patient will perform light housekeeping with assistance, with/without cues (OT). Flowsheets (Taken 05/02/2022 1252) LTG: Pt will perform light housekeeping  with assistance level of: Independent with assistive device   Problem: RH Toilet Transfers Goal: LTG Patient will perform toilet transfers w/assist (OT) Description: LTG: Patient will perform toilet transfers with assist, with/without cues using equipment (OT) Flowsheets (Taken 05/02/2022 1252) LTG: Pt will perform toilet transfers with assistance level of: Independent with assistive device   Problem: RH Tub/Shower Transfers Goal: LTG Patient will perform tub/shower transfers w/assist (OT) Description: LTG: Patient will perform tub/shower transfers with assist, with/without cues using equipment (OT) Flowsheets (Taken 05/02/2022 1252) LTG: Pt will perform tub/shower stall transfers with assistance level of: Independent with assistive device

## 2022-05-03 DIAGNOSIS — E871 Hypo-osmolality and hyponatremia: Secondary | ICD-10-CM | POA: Diagnosis not present

## 2022-05-03 DIAGNOSIS — E1165 Type 2 diabetes mellitus with hyperglycemia: Secondary | ICD-10-CM | POA: Diagnosis not present

## 2022-05-03 DIAGNOSIS — T1490XA Injury, unspecified, initial encounter: Secondary | ICD-10-CM | POA: Diagnosis not present

## 2022-05-03 DIAGNOSIS — D62 Acute posthemorrhagic anemia: Secondary | ICD-10-CM | POA: Diagnosis not present

## 2022-05-03 LAB — GLUCOSE, CAPILLARY
Glucose-Capillary: 148 mg/dL — ABNORMAL HIGH (ref 70–99)
Glucose-Capillary: 59 mg/dL — ABNORMAL LOW (ref 70–99)
Glucose-Capillary: 92 mg/dL (ref 70–99)
Glucose-Capillary: 138 mg/dL — ABNORMAL HIGH (ref 70–99)
Glucose-Capillary: 141 mg/dL — ABNORMAL HIGH (ref 70–99)

## 2022-05-03 MED ORDER — GLIPIZIDE 5 MG PO TABS
5.0000 mg | ORAL_TABLET | Freq: Once | ORAL | Status: AC
  Administered 2022-05-03: 5 mg via ORAL
  Filled 2022-05-03: qty 1

## 2022-05-03 MED ORDER — GLIPIZIDE 10 MG PO TABS
10.0000 mg | ORAL_TABLET | Freq: Every day | ORAL | Status: DC
  Administered 2022-05-04 – 2022-05-07 (×4): 10 mg via ORAL
  Filled 2022-05-03 (×4): qty 1

## 2022-05-03 NOTE — Progress Notes (Signed)
Hypoglycemic Event  CBG: 59  Treatment: 8 oz juice/soda  Symptoms: None  Follow-up CBG: Time:1223 CBG Result:92  Possible Reasons for Event: Inadequate meal intake  Comments/MD notified: MD notified    Taylor [name]

## 2022-05-03 NOTE — Progress Notes (Signed)
PROGRESS NOTE   Subjective/Complaints: Pt felt that she had a good day with therapy yesterday. Ongoing chest wall and back soreness but working through it. Still on O2 at 1L this morning when I came in.   ROS: Patient denies fever, rash, sore throat, blurred vision, dizziness, nausea, vomiting, diarrhea, cough, shortness of breath or chest pain,  headache, or mood change.    Objective:   No results found. Recent Labs    05/02/22 0534  WBC 9.7  HGB 9.2*  HCT 27.9*  PLT 341   Recent Labs    05/02/22 0534  NA 130*  K 4.6  CL 100  CO2 20*  GLUCOSE 149*  BUN 19  CREATININE 0.94  CALCIUM 8.4*    Intake/Output Summary (Last 24 hours) at 05/03/2022 0827 Last data filed at 05/03/2022 0745 Gross per 24 hour  Intake 1410 ml  Output --  Net 1410 ml        Physical Exam: Vital Signs Blood pressure 128/65, pulse 61, temperature 98.4 F (36.9 C), temperature source Oral, resp. rate 18, height 5' 7.5" (1.715 m), weight 86.3 kg, SpO2 94 %.  Constitutional: No distress . Vital signs reviewed. HEENT: NCAT, EOMI, oral membranes moist Neck: supple Cardiovascular: RRR without murmur. No JVD    Respiratory/Chest: CTA Bilaterally without wheezes or rales. Normal effort, O2 [place]    GI/Abdomen: BS +, non-tender, non-distended Ext: no clubbing, cyanosis, or edema Psych: pleasant and cooperative  Skin: Clean and intact without signs of breakdown. Abrasion RLE, bruises on either arm Neuro:  Alert and oriented x 3. Normal insight and awareness. Intact Memory. Normal language and speech. Cranial nerve exam unremarkable. MMT: UE limited by chest/back pain but at least 4/5. LE 3- prox to 4/5 distally. No sensory findings. Normal tone. .   Musculoskeletal: back and chest wall discomfort with inspiration, palpation and rom.     Assessment/Plan: 1. Functional deficits which require 3+ hours per day of interdisciplinary therapy in a  comprehensive inpatient rehab setting. Physiatrist is providing close team supervision and 24 hour management of active medical problems listed below. Physiatrist and rehab team continue to assess barriers to discharge/monitor patient progress toward functional and medical goals  Care Tool:  Bathing    Body parts bathed by patient: Right arm, Left arm, Chest, Abdomen, Front perineal area, Right upper leg, Left upper leg, Right lower leg, Left lower leg, Face     Body parts n/a: Buttocks   Bathing assist Assist Level: Minimal Assistance - Patient > 75%     Upper Body Dressing/Undressing Upper body dressing   What is the patient wearing?: Pull over shirt    Upper body assist Assist Level: Set up assist    Lower Body Dressing/Undressing Lower body dressing      What is the patient wearing?: Underwear/pull up, Pants     Lower body assist Assist for lower body dressing: Contact Guard/Touching assist     Toileting Toileting    Toileting assist Assist for toileting: Contact Guard/Touching assist     Transfers Chair/bed transfer  Transfers assist     Chair/bed transfer assist level: Minimal Assistance - Patient > 75%  Locomotion Ambulation   Ambulation assist      Assist level: Minimal Assistance - Patient > 75% Assistive device: Hand held assist Max distance: 82ft   Walk 10 feet activity   Assist     Assist level: Minimal Assistance - Patient > 75% Assistive device: Hand held assist   Walk 50 feet activity   Assist    Assist level: Minimal Assistance - Patient > 75% Assistive device: Hand held assist    Walk 150 feet activity   Assist Walk 150 feet activity did not occur: Safety/medical concerns         Walk 10 feet on uneven surface  activity   Assist Walk 10 feet on uneven surfaces activity did not occur: Safety/medical concerns         Wheelchair     Assist Is the patient using a wheelchair?: Yes (only for  transport) Type of Wheelchair: Manual    Wheelchair assist level: Dependent - Patient 0%      Wheelchair 50 feet with 2 turns activity    Assist        Assist Level: Dependent - Patient 0%   Wheelchair 150 feet activity     Assist      Assist Level: Dependent - Patient 0%   Blood pressure 128/65, pulse 61, temperature 98.4 F (36.9 C), temperature source Oral, resp. rate 18, height 5' 7.5" (1.715 m), weight 86.3 kg, SpO2 94 %.  Medical Problem List and Plan: 1. Functional deficits secondary to polytrauma including right 11-12th rib fx with PTX, T11 Vertebral body fx, lumbar spine fx's             -patient may shower with brace off and back supported.              -ELOS/Goals: 6-9 days, mod I goals             -apply TLSO in sitting position  -Continue CIR therapies including PT, OT  2.  Antithrombotics: -DVT/anticoagulation:  Pharmaceutical: Lovenox--will decrease to 40 mg daily             -antiplatelet therapy: DAPT resumed today (04/19). Recheck CBC in am.  3. Pain Management: D/c tylenol 1000 mg qid due to abnormal LFTs. Continue robaxin 1000 TID.    -scheduled tramadol 50 mg qid.  --Oxycodone prn.  4/21 pain mgt reasonable--may use tylenol prn 4. Mood/Behavior/Sleep: LCSW to follow for evaluation and support.              -antipsychotic agents: N/A             --Xanax prn as at home for insomnia.  5. Neuropsych/cognition: This patient is capable of making decisions on her own behalf. 6. Skin/Wound Care: Routine pressure relief measures.  7. Fluids/Electrolytes/Nutrition: encourage PO  I personally reviewed the patient's labs today.    4/21-mild hyponatremia 130--recheck Monday  8. Right rib Fx/R-PTX:  Encourage pulmonary hygiene. Flutter valve added.             --SOB/DOE w/activity- ->wean oxygen to off as tolerated.  9. T2DM: Hgb A1c- 7.6 and reasonably controlled. Was on glipizide and metformin PTA. --Monitor BS ac/hs and use SSI for elevated BS CBG  (last 3)  Recent Labs    05/02/22 1728 05/02/22 2050 05/03/22 0602  GLUCAP 135* 208* 148*   -resumed low dose glipizide 4/20--increase to  daily 4/21             --continue to hold metformin--  10. Acute on chronic renal failure: BUN/SCr-21/1.4 at admission-->34/1.83-->17/1.03 today             --reports intake has been poor due to dental pain as well as recent surgery.              --Zestril was d/c 04/13.    -BUN/Cr improved 4/20 11. CAD s/p CABG: Monitor for symptoms with increase in activity             --on Imdur and off Lisinopril for AKI. DAPT. Resumed. Continue to hold Crestor.  12. H/o depression w/ anxiety: Continue Zoloft w/ xanax prn.  --monitor sodium levels for further drop.  13. Abnormal LFTs: AST-876 and ALT 738 at admission likely due to shocked liver v/s excessive tylenol use PTA due to dental pain/procedure --4/20 LFT's substantially improved today -can resume tylenol as needed 14. ABLA: Monitor H/H with serial checks. Did drop from 12.2-->8.5 -- 4/20 hgb up to 9.2, stable 16. OIC: On Miralax and colace-->increase miralax to bid as no BM since admission             --Sorbitol prn.    -large bm 4/19 17. Recurrent melanoma s/p resection 02/2022: Completed Tx/on surveillance.   18. Hypothyroid: On supplement.  19. Code status: Dicussed w/patient who wants to be full code.     LOS: 2 days A FACE TO FACE EVALUATION WAS PERFORMED  Ranelle Oyster 05/03/2022, 8:27 AM

## 2022-05-04 DIAGNOSIS — T1490XA Injury, unspecified, initial encounter: Secondary | ICD-10-CM | POA: Diagnosis not present

## 2022-05-04 LAB — BASIC METABOLIC PANEL
Anion gap: 10 (ref 5–15)
BUN: 26 mg/dL — ABNORMAL HIGH (ref 8–23)
CO2: 24 mmol/L (ref 22–32)
Calcium: 9.2 mg/dL (ref 8.9–10.3)
Chloride: 97 mmol/L — ABNORMAL LOW (ref 98–111)
Creatinine, Ser: 0.93 mg/dL (ref 0.44–1.00)
GFR, Estimated: >60 mL/min (ref >60)
Glucose, Bld: 169 mg/dL — ABNORMAL HIGH (ref 70–99)
Potassium: 4.6 mmol/L (ref 3.5–5.1)
Sodium: 131 mmol/L — ABNORMAL LOW (ref 135–145)

## 2022-05-04 LAB — GLUCOSE, CAPILLARY
Glucose-Capillary: 142 mg/dL — ABNORMAL HIGH (ref 70–99)
Glucose-Capillary: 87 mg/dL (ref 70–99)
Glucose-Capillary: 153 mg/dL — ABNORMAL HIGH (ref 70–99)
Glucose-Capillary: 129 mg/dL — ABNORMAL HIGH (ref 70–99)

## 2022-05-04 LAB — VITAMIN D 25 HYDROXY (VIT D DEFICIENCY, FRACTURES): Vit D, 25-Hydroxy: 31.96 ng/mL (ref 30–100)

## 2022-05-04 LAB — MAGNESIUM: Magnesium: 1.9 mg/dL (ref 1.7–2.4)

## 2022-05-04 NOTE — Progress Notes (Signed)
Inpatient Rehabilitation Care Coordinator Assessment and Plan Patient Details  Name: Taylor [name] MRN: [id number] Date of Birth: May 13, 1945  Today's Date: 05/04/2022  Hospital Problems: Principal Problem:   Trauma  Past Medical History:  Past Medical History:  Diagnosis Date   Cancer of left breast    Complication of anesthesia    Coronary artery disease    a. 2006 s/p CABG x 4 (LIMA->LAD, VG->Diag->OM, VG->RPDA);  b. 02/2015: [place] s/p DES to [facility], VG->PDA & VG->D1->OM 100; c. 06/2015 Cath: diffuse dzs->Med rx; d. 11/2015 Cath/PCI: LM nl, LAD 100ost, 95d, D1 90ost, LCX 35ost, 65p, OM2 40, lat OM2 90, RCA 95p (2.75x16 Synergy DES), patent mid stent, RPLB 70, LIMA->LAD ok.EF55-65%.   Depression    Hx of tobacco use, presenting hazards to health    a. quit 2006.   Hypercholesteremia    Hypertension    Hypertensive heart disease    Echo 04/2019: Inferior HK, EF 55, normal RV SF, RVSP normal at 29.2, mild BAE, trivial MR, mild aortic valve sclerosis without stenosis   Left carotid bruit    a. 09/2014 Carotid U/S: 1-39% bilat ICA stenosis.   PAD (peripheral artery disease)    a. 11/2014 ABI: R: 0.63, L 0.59;  b. 11/2014 Periph Angio: bilat pop occlusions. L - short w/ reconstituion via collats in dist pop w/ 2 vessel runoff, R long w/ reconstitution in prox tib/peroneal arteries-->med Rx w/ pletal.   Pulmonary embolism a. 2014.   Reflux esophagitis    Type II diabetes mellitus    Past Surgical History:  Past Surgical History:  Procedure Laterality Date   BREAST BIOPSY Left ~ 2012   BUNIONECTOMY Left 1970s   CARDIAC CATHETERIZATION  04/2004   CARDIAC CATHETERIZATION N/A 02/21/2015   Procedure: Left Heart Cath and Cors/Grafts Angiography;  Surgeon: Lyn Records, MD;  Location: [facility] INVASIVE CV LAB;  Service: Cardiovascular;  Laterality: N/A;   CARDIAC CATHETERIZATION N/A 02/21/2015   Procedure: Coronary Stent Intervention;  Surgeon: Lyn Records, MD;  Location: [facility] INVASIVE CV LAB;  Service:  Cardiovascular;  Laterality: N/A;   CARDIAC CATHETERIZATION N/A 02/21/2015   Procedure: Intravascular Pressure Wire/FFR Study;  Surgeon: Lyn Records, MD;  Location: [facility] INVASIVE CV LAB;  Service: Cardiovascular;  Laterality: N/A;   CARDIAC CATHETERIZATION N/A 06/20/2015   Procedure: Left Heart Cath and Cors/Grafts Angiography;  Surgeon: Laurey Morale, MD;  Location: [facility] INVASIVE CV LAB;  Service: Cardiovascular;  Laterality: N/A;   CARDIAC CATHETERIZATION N/A 11/19/2015   Procedure: Left Heart Cath and Cors/Grafts Angiography;  Surgeon: Peter M Swaziland, MD;  Location: [facility] INVASIVE CV LAB;  Service: Cardiovascular;  Laterality: N/A;   CARDIAC CATHETERIZATION N/A 11/19/2015   Procedure: Coronary Stent Intervention;  Surgeon: Peter M [place], MD;  Location: [facility] INVASIVE CV LAB;  Service: Cardiovascular;  Laterality: N/A;   CATARACT EXTRACTION W/ INTRAOCULAR LENS  IMPLANT, BILATERAL Bilateral 1980s-1990s   CORONARY ANGIOPLASTY WITH STENT PLACEMENT  02/21/2015   "1 stent"   CORONARY ARTERY BYPASS GRAFT  05/2004   "CABG X4"   EYE SURGERY     LAPAROSCOPIC CHOLECYSTECTOMY  1990s   MASTECTOMY Left ~ 2012   PERIPHERAL VASCULAR CATHETERIZATION N/A 12/12/2014   Procedure: Abdominal Aortogram;  Surgeon: Iran Ouch, MD;  Location: [facility] INVASIVE CV LAB;  Service: Cardiovascular;  Laterality: N/A;   RETINAL DETACHMENT SURGERY Right 1990s   Social History:  reports that she quit smoking about 17 years ago. Her smoking use included cigarettes. She has a 7.50  pack-year smoking history. She has never used smokeless tobacco. She reports that she does not drink alcohol and does not use drugs.  Family / Support Systems Marital Status: Married Patient Roles: Spouse Spouse/Significant Other: Achsah Mcquade Children: son, Barbara Cower Other Supports: Sister Anticipated Caregiver: Spouse, [name] Ability/Limitations of Caregiver: None Caregiver Availability: 24/7 Family Dynamics: support primarily from spouse  Social  History Preferred language: English Religion: Non-Denominational Cultural Background: Independent and retired Education: HS Health Literacy - How often do you need to have someone help you when you read instructions, pamphlets, or other written material from your doctor or pharmacy?: Never Writes: Yes Employment Status: Retired Marine scientist Issues: N/A Guardian/Conservator: N/A   Abuse/Neglect Abuse/Neglect Assessment Can Be Completed: Yes Physical Abuse: Denies Verbal Abuse: Denies Sexual Abuse: Denies Exploitation of patient/patient's resources: Denies Self-Neglect: Denies  Patient response to: Social Isolation - How often do you feel lonely or isolated from those around you?: Never  Emotional Status Pt's affect, behavior and adjustment status: Pleasant Recent Psychosocial Issues: coping Psychiatric History: N/A Substance Abuse History: N/A  Patient / Family Perceptions, Expectations & Goals Pt/Family understanding of illness & functional limitations: yes Premorbid pt/family roles/activities: Independent overall Anticipated changes in roles/activities/participation: Spouse able to assist with roles and tasks, anticpating MOD I goals Pt/family expectations/goals: MOD I  Building surveyor: None Premorbid Home Care/DME Agencies: None Transportation available at discharge: Drives. Spouse able to transport patient Is the patient able to respond to transportation needs?: Yes In the past 12 months, has lack of transportation kept you from medical appointments or from getting medications?: No In the past 12 months, has lack of transportation kept you from meetings, work, or from getting things needed for daily living?: No Resource referrals recommended: Neuropsychology  Discharge Planning Living Arrangements: Spouse/significant other Support Systems: Spouse/significant other Type of Residence: Private residence (1 step) Insurance Resources:  Electrical engineer Resources: Restaurant manager, fast food Screen Referred: No Living Expenses: Lives with family Money Management: Patient, Spouse Does the patient have any problems obtaining your medications?: No Home Management: Independent Patient/Family Preliminary Plans: Plans to continue to manage. Spouse able to assist if needed Care Coordinator Barriers to Discharge: Lack of/limited family support, Decreased caregiver support Care Coordinator Anticipated Follow Up Needs: HH/OP Expected length of stay: 6-9 Days  Clinical Impression SW met with patient introduced self and explained role. Patient anticipates discharging back home with spouse able to assist/supervise 24/7. 1 step to enter home. No DME. No additional questions or concerns.  Andria Rhein 05/04/2022, 1:57 PM

## 2022-05-04 NOTE — Progress Notes (Signed)
Occupational Therapy Session Note  Patient Details  Name: NACHELLE NEGRETTE MRN: [id number] Date of Birth: 05/01/1945  Today's Date: 05/04/2022 OT Individual Time: 1301-1415 OT Individual Time Calculation (min): 74 min    Short Term Goals: Week 1:  OT Short Term Goal 1 (Week 1): STGs=LTGs due to patient's length of stay.  Skilled Therapeutic Interventions/Progress Updates:   Pt seen for pm OT session with pt requesting to take a shower and complete hair washing. Pt reports fatigue and would like to return to bed aftewr shower in gown. Pt 1st requested to stop at toilet and try for BM. Cues for scooting to edge of recliner prior to attempting sit to stand with CGA. Amb with CGA to commode with 3 in 1 over top. BM and voiding completed with min A for hygiene and CGA for pulling down and up elastic clothing. Amb to shower stall with RW and CGA and transfer laterally on bench with bar with CGA. Doffed TLSO brace seated with min -mod A with reacher. LH sponge trng conducted for LB bathing with CGA. UB bathing and hair washing with set up. Donned hospital gown and TLSO after bathing with minA-CGA and figure 4 for donning slipper socks.  Amb with RW back to sink side w/c set up for hair drying with close S.  Pt transferred back to bed with CGA, removed TLSO at EOB and sit to supine with bed features and rails with CGA incl LB mngt despite soreness.  Left pt at end of session with needs, nurse call button and bed exit alarm active.   Pain: Pt reports relatively low 2-3/10 pain overall in ribs but increases at times with certain mvmnt and deep breaths. Resolved to 1/10 in bed after session.     Therapy Documentation Precautions:  Precautions Precautions: Fall, Back, Other (comment) Precaution Booklet Issued:  (written spine precautions provided) Precaution Comments: needs reinforcement of education on spine precautions and application to mobility tasks, monitor SpO2 maintain >90% Required Braces or  Orthoses: Spinal Brace Spinal Brace: Thoracolumbosacral orthotic, Applied in sitting position Restrictions Weight Bearing Restrictions: No    Therapy/Group: Individual Therapy  Vicenta Dunning 05/04/2022, 7:48 AM

## 2022-05-04 NOTE — Progress Notes (Signed)
Inpatient Rehabilitation  Patient information reviewed and entered into eRehab system by Aubre Quincy Jaskiran Pata, OTR/L, Rehab Quality Coordinator.   Information including medical coding, functional ability and quality indicators will be reviewed and updated through discharge.   

## 2022-05-04 NOTE — Progress Notes (Addendum)
Occupational Therapy Session Note  Patient Details  Name: MEDIA PIZZINI MRN: [id number] Date of Birth: 03-Aug-1945  Today's Date: 05/04/2022 OT Individual Time: 4098-1191 OT Individual Time Calculation (min): 41 min    Short Term Goals: Week 1:  OT Short Term Goal 1 (Week 1): STGs=LTGs due to patient's length of stay.  Skilled Therapeutic Interventions/Progress Updates:    Patient agreeable to participate in OT session. Reports 2/10 pain level in back region. Monitored during session and limited activities to patient's tolerance level.   Patient participated in skilled OT session focusing on functional transfers, sit<>stand transitions, AE training, and UE strengthening in order to improve safety awareness and functional performance during ADL tasks.   Completed sit<>stand transition using RW and min assist with VC for hand placement on RW and chair.  Pt able to recall back precautions during session when asked.   AE training: Utilized reacher and [facility] to retrieve and don shoes. VC for technique. LHSH used for left shoe only.   BUE strengthening:  - seated, red band, horizontal abduction/adduction, IR/er, flexion (to 90*), 10X, 1 set  Pt completed all functional transfers w/c<>recliner with Min A utilizing RW.  Therapy Documentation Precautions:  Precautions Precautions: Fall, Back, Other (comment) Precaution Booklet Issued:  (written spine precautions provided) Precaution Comments: needs reinforcement of education on spine precautions and application to mobility tasks Required Braces or Orthoses: Spinal Brace Spinal Brace: Thoracolumbosacral orthotic, Applied in sitting position Restrictions Weight Bearing Restrictions: No  Therapy/Group: Individual Therapy  Limmie Patricia, OTR/L,CBIS  Supplemental OT - [facility] and [facility] Secure Chat Preferred   05/04/2022, 12:20 PM

## 2022-05-04 NOTE — Progress Notes (Signed)
Inpatient Rehabilitation Center Individual Statement of Services  Patient Name:  Taylor [name]  Date:  05/04/2022  Welcome to the Inpatient Rehabilitation Center.  Our goal is to provide you with an individualized program based on your diagnosis and situation, designed to meet your specific needs.  With this comprehensive rehabilitation program, you will be expected to participate in at least 3 hours of rehabilitation therapies Monday-Friday, with modified therapy programming on the weekends.  Your rehabilitation program will include the following services:  Physical Therapy (PT), Occupational Therapy (OT), Speech Therapy (ST), 24 hour per day rehabilitation nursing, Therapeutic Recreaction (TR), Neuropsychology, Care Coordinator, Rehabilitation Medicine, Nutrition Services, Pharmacy Services, and Other  Weekly team conferences will be held on Wednesdays to discuss your progress.  Your Inpatient Rehabilitation Care Coordinator will talk with you frequently to get your input and to update you on team discussions.  Team conferences with you and your family in attendance may also be held.  Expected length of stay:  6-9 Days  Overall anticipated outcome:  MOD I  Depending on your progress and recovery, your program may change. Your Inpatient Rehabilitation Care Coordinator will coordinate services and will keep you informed of any changes. Your Inpatient Rehabilitation Care Coordinator's name and contact numbers are listed  below.  The following services may also be recommended but are not provided by the Inpatient Rehabilitation Center:   Home Health Rehabiltiation Services Outpatient Rehabilitation Services    Arrangements will be made to provide these services after discharge if needed.  Arrangements include referral to agencies that provide these services.  Your insurance has been verified to be:   Medicare A & B Your primary doctor is:  [name], MD  Pertinent information will  be shared with your doctor and your insurance company.  Inpatient Rehabilitation Care Coordinator:  Lavera Guise, [place] [phone] or [phone]  Information discussed with and copy given to patient by: Andria Rhein, 05/04/2022, 10:35 AM
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
## 2022-05-04 NOTE — Progress Notes (Signed)
Physical Therapy Session Note  Patient Details  Name: Taylor [name] MRN: [id number] Date of Birth: 15-May-1945  Today's Date: 05/04/2022 PT Individual Time: 4098-1191 PT Individual Time Calculation (min): 70 min   Short Term Goals: Week 1:  PT Short Term Goal 1 (Week 1): = to LTGs based on ELOS  Skilled Therapeutic Interventions/Progress Updates:    Chart reviewed and pt agreeable to therapy. Pt received seated in bed with 3/10 c/o pain in ribs and back. Also of note, pt on RA. Session focused on funcitonal transfers and amb endurance and quality to promote safe home access. Pt initiated session with transfer to EOB using supervision. Pt then required ModA to donn TLSO. Pt transferred to [facility] using CGA + RW. Pt displayed safe RW use. Pt then completed amb trials of 178ft + 151ft +175ft using CGA + RW progressing to close sup + RW. Pt noted to have good safety judgement and use of RW on turns. Pt then completed 1x5 step ups with B rails + 1x5 step ups with L rail only. Pt then completed blocked practice of traversing 1 steps with L ascending rail using MinA. Pt noted to have decreased confidence in stair navigation that improved with practice. Pt then returned to room and required close sup to amb to recliner + RW. Pt and PT discussed home safety with pt confirming understanding of precautions. At end of session, pt was left seated in recliner with alarm engaged, nurse call bell and all needs in reach.     Therapy Documentation Precautions:  Precautions Precautions: Fall, Back, Other (comment) Precaution Booklet Issued:  (written spine precautions provided) Precaution Comments: needs reinforcement of education on spine precautions and application to mobility tasks Required Braces or Orthoses: Spinal Brace Spinal Brace: Thoracolumbosacral orthotic, Applied in sitting position Restrictions Weight Bearing Restrictions: No    Therapy/Group: Individual Therapy  Dionne Milo, PT,  DPT 05/04/2022, 12:06 PM

## 2022-05-04 NOTE — IPOC Note (Signed)
Overall Plan of Care [facility]) Patient Details Name: Taylor [name] MRN: [id number] DOB: 03/22/1945  Admitting Diagnosis: Trauma  Hospital Problems: Principal Problem:   Trauma     Functional Problem List: Nursing Bowel, Pain, Safety, Endurance, Medication Management  PT Balance, Safety, Sensory, Skin Integrity, Endurance, Motor, Nutrition, Pain  OT Balance, Endurance, Pain  SLP    TR         Basic ADL's: OT Bathing, Dressing, Toileting     Advanced  ADL's: OT Simple Meal Preparation, Light Housekeeping     Transfers: PT Bed Mobility, Bed to Chair, Car, Occupational psychologist, Research scientist (life sciences): PT Ambulation, Stairs     Additional Impairments: OT    SLP        TR      Anticipated Outcomes Item Anticipated Outcome  Self Feeding    Swallowing      Basic self-care  Mod I  Toileting  Mod I   Bathroom Transfers Mod I  Bowel/Bladder  manage bowel w mod I  Transfers  mod-I using LRAD  Locomotion  supervision using LRAD  Communication     Cognition     Pain  < 4 with prns  Safety/Judgment  manage w cues   Therapy Plan: PT Intensity: Minimum of 1-2 x/day ,45 to 90 minutes PT Frequency: 5 out of 7 days PT Duration Estimated Length of Stay: 7-10 days OT Intensity: Minimum of 1-2 x/day, 45 to 90 minutes OT Frequency: 5 out of 7 days OT Duration/Estimated Length of Stay: 7-10 days     Team Interventions: Nursing Interventions Patient/Family Education, Disease Management/Prevention, Discharge Planning, Pain Management, Bowel Management, Medication Management  PT interventions Ambulation/gait training, Community reintegration, DME/adaptive equipment instruction, Neuromuscular re-education, Psychosocial support, Stair training, UE/LE Strength taining/ROM, Warden/ranger, Discharge planning, Pain management, Skin care/wound management, UE/LE Coordination activities, Therapeutic Activities, Cognitive remediation/compensation, Disease  management/prevention, Functional mobility training, Patient/family education, Splinting/orthotics, Therapeutic Exercise, Visual/perceptual remediation/compensation  OT Interventions Warden/ranger, Community reintegration, Discharge planning, DME/adaptive equipment instruction, Functional mobility training, Pain management, Patient/family education, Self Care/advanced ADL retraining, Therapeutic Activities, Therapeutic Exercise, UE/LE Coordination activities  SLP Interventions    TR Interventions    SW/CM Interventions     Barriers to Discharge MD  Medical stability  Nursing Decreased caregiver support 1 level 1 ste left rail w spouse  PT      OT New oxygen    SLP      SW       Team Discharge Planning: Destination: PT-Home ,OT- Home , SLP-  Projected Follow-up: PT-Outpatient PT, 24 hour supervision/assistance, OT-  Outpatient OT, SLP-  Projected Equipment Needs: PT-To be determined, OT- To be determined, SLP-  Equipment Details: PT- , OT-  Patient/family involved in discharge planning: PT- Patient,  OT-Patient, SLP-   MD ELOS: 6-9 days Medical Rehab Prognosis:  Excellent Assessment: The patient has been admitted for CIR therapies with the diagnosis of polytrauma. The team will be addressing functional mobility, strength, stamina, balance, safety, adaptive techniques and equipment, self-care, bowel and bladder mgt, patient and caregiver education. Goals have been set at modI. Anticipated discharge destination is home.         See Team Conference Notes for weekly updates to the plan of care

## 2022-05-05 DIAGNOSIS — T1490XA Injury, unspecified, initial encounter: Secondary | ICD-10-CM | POA: Diagnosis not present

## 2022-05-05 LAB — GLUCOSE, CAPILLARY
Glucose-Capillary: 141 mg/dL — ABNORMAL HIGH (ref 70–99)
Glucose-Capillary: 104 mg/dL — ABNORMAL HIGH (ref 70–99)
Glucose-Capillary: 170 mg/dL — ABNORMAL HIGH (ref 70–99)
Glucose-Capillary: 129 mg/dL — ABNORMAL HIGH (ref 70–99)

## 2022-05-05 MED ORDER — VITAMIN D (ERGOCALCIFEROL) 1.25 MG (50000 UNIT) PO CAPS
50000.0000 [IU] | ORAL_CAPSULE | ORAL | Status: DC
  Administered 2022-05-05: 50000 [IU] via ORAL
  Filled 2022-05-05: qty 1

## 2022-05-05 MED ORDER — TRAMADOL HCL 50 MG PO TABS
50.0000 mg | ORAL_TABLET | Freq: Three times a day (TID) | ORAL | 0 refills | Status: DC
  Filled 2022-05-05: qty 20, 5d supply, fill #0

## 2022-05-05 MED ORDER — ALPRAZOLAM 0.25 MG PO TABS
0.2500 mg | ORAL_TABLET | Freq: Two times a day (BID) | ORAL | Status: DC | PRN
  Administered 2022-05-05 – 2022-05-06 (×2): 0.25 mg via ORAL
  Filled 2022-05-05 (×2): qty 1

## 2022-05-05 MED ORDER — METHOCARBAMOL 500 MG PO TABS
1000.0000 mg | ORAL_TABLET | Freq: Three times a day (TID) | ORAL | 0 refills | Status: DC
  Filled 2022-05-05: qty 180, 30d supply, fill #0

## 2022-05-05 MED ORDER — VITAMIN D (ERGOCALCIFEROL) 1.25 MG (50000 UNIT) PO CAPS
50000.0000 [IU] | ORAL_CAPSULE | ORAL | 0 refills | Status: DC
  Filled 2022-05-05: qty 5, 35d supply, fill #0

## 2022-05-05 MED ORDER — DOCUSATE SODIUM 100 MG PO CAPS
100.0000 mg | ORAL_CAPSULE | Freq: Two times a day (BID) | ORAL | 0 refills | Status: DC
  Filled 2022-05-05: qty 60, 30d supply, fill #0

## 2022-05-05 MED ORDER — MAGNESIUM SULFATE IN D5W 1-5 GM/100ML-% IV SOLN
1.0000 g | Freq: Once | INTRAVENOUS | Status: AC
  Administered 2022-05-05: 1 g via INTRAVENOUS
  Filled 2022-05-05: qty 100

## 2022-05-05 MED ORDER — OXYCODONE HCL 5 MG PO TABS
5.0000 mg | ORAL_TABLET | Freq: Two times a day (BID) | ORAL | 0 refills | Status: DC | PRN
  Filled 2022-05-05: qty 10, 5d supply, fill #0

## 2022-05-05 NOTE — Telephone Encounter (Signed)
It looks like patient was scheduled for virtual visit on 04/29/2022 for cardiac risk assessment which was cancelled. Review of records reveals that unfortunately she was reported to sustain a fall on that day.  She was admitted for treatment and remains inpatient at this time. Will forward to requesting office for their awareness.  Levi Aland, NP-C  05/05/2022, 1:55 PM [address], [address] Office [phone] Fax [phone]

## 2022-05-05 NOTE — Progress Notes (Signed)
Physical Therapy Session Note  Patient Details  Name: Taylor [name] MRN: [id number] Date of Birth: 1945-04-28  Today's Date: 05/05/2022 PT Individual Time: 1350-1430 PT Individual Time Calculation (min): 40 min   Short Term Goals: Week 1:  PT Short Term Goal 1 (Week 1): = to LTGs based on ELOS  Skilled Therapeutic Interventions/Progress Updates:     Pt received supine in bed anda grees to therapy. Reports some pain in R back. PT provides bracing and rest breaks to manage pain. Pt perform supine to sit with bed features with cues for sequencing and positioning. Pt performs sit to stand with cues for sequencing and minA overall. Pt ambulates x150' without AD, with minA and cues to increase bilateral stride length to decrease risk for falls and improve gait pattern. During rest break, PT educates on importance of taking longer strides and utilizing trunk rotation and arm swing to improve balance. Pt verbalizes understanding. Pt then ambulates x120' with minA and same cues, but with improved stride length. Pt tends to become anxious and reverts to more shuffling gait pattern, but is able to correct with cues. PT challenges pt to ambulate 15' forward, turn and walk 15' back to mat. PT counts steps and provides feedback to pt that she took 40 steps to complete bout of ambulation. Pt challenged to decrease number of steps required to complete above distance, and pt performs again, completing with 19 steps (and minA for stability). Pt completes additional bout and again requires 19 steps. PT challenges pt to maintain stride length while ambulating back to room and pt demonstrates good carryover, ambulating x150' back to room with improved stride length, gait pattern, and balance. Pt left semi-reclined with all needs within reach.   Therapy Documentation Precautions:  Precautions Precautions: Fall, Back, Other (comment) Precaution Booklet Issued:  (written spine precautions provided) Precaution Comments:  needs reinforcement of education on spine precautions and application to mobility tasks Required Braces or Orthoses: Spinal Brace Spinal Brace: Thoracolumbosacral orthotic, Applied in sitting position Restrictions Weight Bearing Restrictions: No    Therapy/Group: Individual Therapy  Beau Fanny, PT, DPT 05/05/2022, 5:00 PM

## 2022-05-05 NOTE — Progress Notes (Signed)
Occupational Therapy Session Note  Patient Details  Name: Taylor [name] MRN: [id number] Date of Birth: Jun 11, 1945  Today's Date: 05/05/2022 OT Individual Time: 0950-1030 & 1435-1530 OT Individual Time Calculation (min): 40 min & 55 min   Short Term Goals: Week 1:  OT Short Term Goal 1 (Week 1): STGs=LTGs due to patient's length of stay.  Session 1 Skilled OT intervention completed with focus on DC planning and ambulatory transfers. Pt received seated in recliner, agreeable to session. No pain reported, TSLO already donned.  Completed CGA sit > stand and ambulatory transfer using RW to w/c. Transported dependently in w/c <> ADL bathroom for time and energy conservation.   In ADL bathroom, discussed with pt her home set up with pt reporting she has tub/shower with current shower chair available. Discovered that pt may need to side step into doorway due to narrow opening that is too small for a RW width, as well as side step in bathroom to access the tub. Pt was able to demo CGA level ability with RW side stepping in practice bathroom just as she would at home. Mod-max verbal cues and CGA needed for pt to side step over/out of tub threshold, however pt relying heavily on grab bars for support and she doesn't have these at home. Discussed having them installed if she plans to transfer to the tub with this step over strategy, as well as husband to CGA assist due to difficulty lifting feet at times. Cues needed for leaving enough space for feet when stepping out. Educated on option of tub bench that might be worth trying.   Reviewed donning/doffing of TLSO for full shower when at home, and potential encouraged back support via back rest for full shower if pt notices lack of tolerance with unsupported sitting.  Back in room, pt completed supervision sit > stand and ambulatory transfer to recliner using RW, then remained upright in recliner, with chair alarm on/activated, and with all needs in reach at  end of session.  Session 2 Skilled OT intervention completed with focus on toileting needs, DC planning, ambulatory endurance. Pt received upright in bed, agreeable to session. No pain reported.  Transitioned to EOB with supervision, no cues needed for spinal px. Pt stated that OT needed to donn TLSO, however with mod cues, pt able to donn entire TLSO with supervision! Discussed why her donning and learning how to do independently is important for toileting transfers or when alone in prep for mobility. Donned shoes with figure 4 with set up A.  Supervision sit > stand using RW, then ambulatory transfer to commode in bathroom with CGA. Continent of void/BM; charted. Able to manage all steps with distant supervision despite pt stating wiping is hard for her. Would benefit from toileting wand education. Ambulated with CGA to sink, for stance level hand hygiene with supervision.  Pt agreeable to go outside. Transported dependently in w/c <> outside courtyard for time. Discussed community level or sidewalk mobility outside her home, with pavement being different than slick hospital floors. Pt was able to ambulate with CGA while using RW 150 ft, though much slower pace than when inside. Discussed safety measures including when on incline/decline.  During seated rest, determined that pt wants to use shower chair for showers at home but has option of TTB from sister who owns one if needed. CSW notified. Reviewed shower safety and independence per pt request.  Back in room, completed supervision sit > stand and ambulatory transfer about 10 ft to  EOB. Mod cues needed for doffing method of TLSO to increase efficiency with next time she goes to donn it. Transitioned to upright in bed with good adherence to spinal px.  Pt remained upright in bed with direct care handoff to nursing at end of session.   Therapy Documentation Precautions:  Precautions Precautions: Fall, Back, Other (comment) Precaution Booklet  Issued:  (written spine precautions provided) Precaution Comments: needs reinforcement of education on spine precautions and application to mobility tasks Required Braces or Orthoses: Spinal Brace Spinal Brace: Thoracolumbosacral orthotic, Applied in sitting position Restrictions Weight Bearing Restrictions: No    Therapy/Group: Individual Therapy  Melvyn Novas, MS, OTR/L  05/05/2022, 3:41 PM

## 2022-05-05 NOTE — Progress Notes (Signed)
Physical Therapy Session Note  Patient Details  Name: Taylor [name] MRN: [id number] Date of Birth: January 01, 1946  Today's Date: 05/05/2022 PT Individual Time: 0731-0829 PT Individual Time Calculation (min): 58 min   Short Term Goals: Week 1:  PT Short Term Goal 1 (Week 1): = to LTGs based on ELOS  Skilled Therapeutic Interventions/Progress Updates:   Received pt semi-reclined in bed, pt denied any pain during session but reported feeling extremely anxious and depressed. Pt on anxiety medication and reported taking it already this morning. Notified pt of discharge date on Thursday and pt brightened up and appeared in much better spirits by end of session. Session with emphasis on functional mobility/transfers, dressing, generalized strengthening and endurance, dynamic standing balance/coordination, gait training, stair navigation, and toileting. Pt transferred supine<>sitting EOB from flat bed to simulate bed at home with CGA using logroll technique. Doffed gown and donned pull over scrub top with supervision and donned TLSO sitting EOB with max A. Donned pants and shoes sitting EOB with max A and stood without AD and min A and required mod A to pull pants over hips - noted posterior lean in standing. Transferred bed<>WC stand<>pivot with RW and CGA and sat in WC at sink and brushed teeth/washed face mod I - pt reported feeling much less anxious after getting OOB. Pt transported to/from room in [facility] dependently for time management purposes.   Stood from [facility] with RW and CGA and pt ambulated 233ft with RW and CGA fading to close supervision - SPO2 91% afterwards and pt denied any SOB. Discussed using reacher upon discharge to adhere to back precautions and pt reports having one at home. Pt stood with RW and close supervision and picked up tissue box from floor using reacher and supervision. Ambulated to/from staircase with RW and supervision and navigated 1 3in step with L handrail and close supervision to  simulate home entry. Suggested pt turn sideways and hold onto L handrail with BUE support as another option. Pt then navigated 12 6in steps with bilateral handrails and supervision alternating ascending with a step to and step through pattern and descending with a step to pattern - SPO2 92% afterwards. Returned to room and pt stood with RW and supervision and ambulated in/out of bathroom with RW and supervision. Pt able to perform all toileting tasks without assist and continent of bladder. Ambulated to recliner and concluded session with pt sitting in recliner with BLEs elevated, needs within reach, and chair pad alarm on.    Therapy Documentation Precautions:  Precautions Precautions: Fall, Back, Other (comment) Precaution Booklet Issued:  (written spine precautions provided) Precaution Comments: needs reinforcement of education on spine precautions and application to mobility tasks Required Braces or Orthoses: Spinal Brace Spinal Brace: Thoracolumbosacral orthotic, Applied in sitting position Restrictions Weight Bearing Restrictions: No  Therapy/Group: Individual Therapy Martin Majestic PT, DPT  05/05/2022, 6:56 AM

## 2022-05-05 NOTE — Progress Notes (Addendum)
Patient ID: Taylor [name], female   DOB: April 15, 1945, 77 y.o.   MRN: [id number]  [facility] referral sent to [facility].   Orders sent

## 2022-05-05 NOTE — Progress Notes (Signed)
PROGRESS NOTE   Subjective/Complaints: No new complaints this morning Wishes she could leave tomorrow but when discussed that team would like to see how she does with adaptive equipment she is agreeable  ROS: Patient denies fever, rash, sore throat, blurred vision, dizziness, nausea, vomiting, diarrhea, cough, shortness of breath or chest pain,  headache, or mood change. +abdominal bloating   Objective:   No results found. No results for input(s): "WBC", "HGB", "HCT", "PLT" in the last 72 hours.  Recent Labs    05/04/22 0612  NA 131*  K 4.6  CL 97*  CO2 24  GLUCOSE 169*  BUN 26*  CREATININE 0.93  CALCIUM 9.2    Intake/Output Summary (Last 24 hours) at 05/05/2022 1250 Last data filed at 05/05/2022 0700 Gross per 24 hour  Intake 720 ml  Output --  Net 720 ml        Physical Exam: Vital Signs Blood pressure (!) 158/97, pulse 61, temperature 98 F (36.7 C), resp. rate 17, height 5' 7.5" (1.715 m), weight 86.3 kg, SpO2 95 %.  Constitutional: No distress . Vital signs reviewed. BMI 29.36 HEENT: NCAT, EOMI, oral membranes moist Neck: supple Cardiovascular: RRR without murmur. No JVD    Respiratory/Chest: CTA Bilaterally without wheezes or rales. Normal effort, O2 [place]    GI/Abdomen: BS +, non-tender, non-distended Ext: no clubbing, cyanosis, or edema Psych: pleasant and cooperative  Skin: Clean and intact without signs of breakdown. Abrasion RLE, bruises on either arm Neuro:  Alert and oriented x 3. Normal insight and awareness. Intact Memory. Normal language and speech. Cranial nerve exam unremarkable. MMT: UE limited by chest/back pain but at least 4/5. LE 3- prox to 4/5 distally. No sensory findings. Normal tone. .   Musculoskeletal: back and chest wall discomfort with inspiration, palpation and rom.  Thoracolumbosacral orthotic is in place   Assessment/Plan: 1. Functional deficits which require 3+ hours per day  of interdisciplinary therapy in a comprehensive inpatient rehab setting. Physiatrist is providing close team supervision and 24 hour management of active medical problems listed below. Physiatrist and rehab team continue to assess barriers to discharge/monitor patient progress toward functional and medical goals  Care Tool:  Bathing    Body parts bathed by patient: Right arm, Left arm, Chest, Abdomen, Front perineal area, Right upper leg, Left upper leg, Right lower leg, Left lower leg, Face     Body parts n/a: Buttocks   Bathing assist Assist Level: Minimal Assistance - Patient > 75%     Upper Body Dressing/Undressing Upper body dressing   What is the patient wearing?: Pull over shirt    Upper body assist Assist Level: Set up assist    Lower Body Dressing/Undressing Lower body dressing      What is the patient wearing?: Underwear/pull up, Pants     Lower body assist Assist for lower body dressing: Contact Guard/Touching assist     Toileting Toileting    Toileting assist Assist for toileting: Contact Guard/Touching assist     Transfers Chair/bed transfer  Transfers assist     Chair/bed transfer assist level: Supervision/Verbal cueing     Locomotion Ambulation   Ambulation assist  Assist level: Supervision/Verbal cueing Assistive device: Walker-rolling Max distance: 279ft   Walk 10 feet activity   Assist     Assist level: Supervision/Verbal cueing Assistive device: Walker-rolling   Walk 50 feet activity   Assist    Assist level: Supervision/Verbal cueing Assistive device: Walker-rolling    Walk 150 feet activity   Assist Walk 150 feet activity did not occur: Safety/medical concerns  Assist level: Supervision/Verbal cueing Assistive device: Walker-rolling    Walk 10 feet on uneven surface  activity   Assist Walk 10 feet on uneven surfaces activity did not occur: Safety/medical concerns         Wheelchair     Assist  Is the patient using a wheelchair?: Yes (only for transport) Type of Wheelchair: Manual    Wheelchair assist level: Dependent - Patient 0%      Wheelchair 50 feet with 2 turns activity    Assist        Assist Level: Dependent - Patient 0%   Wheelchair 150 feet activity     Assist      Assist Level: Dependent - Patient 0%   Blood pressure (!) 158/97, pulse 61, temperature 98 F (36.7 C), resp. rate 17, height 5' 7.5" (1.715 m), weight 86.3 kg, SpO2 95 %.  Medical Problem List and Plan: 1. Functional deficits secondary to polytrauma including right 11-12th rib fx with PTX, T11 Vertebral body fx, lumbar spine fx's             -patient may shower with brace off and back supported.              -ELOS/Goals: 6-9 days, mod I goals             -apply TLSO in sitting position  -Continue CIR therapies including PT, OT  2.  Antithrombotics: -DVT/anticoagulation:  Pharmaceutical: Lovenox--will decrease to 40 mg daily             -antiplatelet therapy: DAPT resumed today (04/19). Recheck CBC in am.  3. Pain Management: D/c tylenol 1000 mg qid due to abnormal LFTs. Continue robaxin 1000 TID.    -scheduled tramadol 50 mg qid.  --Oxycodone prn.  4/21 pain mgt reasonable--may use tylenol prn 4. Mood/Behavior/Sleep: LCSW to follow for evaluation and support.              -antipsychotic agents: N/A             --continue Xanax prn as at home for insomnia.  5. Neuropsych/cognition: This patient is capable of making decisions on her own behalf. 6. Skin/Wound Care: Routine pressure relief measures.  7. Fluids/Electrolytes/Nutrition: encourage PO  I personally reviewed the patient's labs today.    4/21-mild hyponatremia 130--recheck Monday  8. Right rib Fx/R-PTX:  Encourage pulmonary hygiene. Flutter valve added.             --SOB/DOE w/activity- ->wean oxygen to off as tolerated.  9. T2DM: Hgb A1c- 7.6 and reasonably controlled. Was on glipizide and metformin PTA. --Monitor BS  ac/hs and use SSI for elevated BS CBG (last 3)  Recent Labs    05/04/22 2050 05/05/22 0553 05/05/22 1115  GLUCAP 129* 141* 104*   -resumed low dose glipizide 4/20--increase to 10mg  daily 4/21             --continue to hold metformin--                         10. Acute on chronic renal failure:  encouraged 6-8 glasses of water per day 11. CAD s/p CABG: Monitor for symptoms with increase in activity             --on Imdur and off Lisinopril for AKI. DAPT. Resumed. Continue to hold Crestor.  12. H/o depression w/ anxiety: Continue Zoloft w/ xanax prn.  --monitor sodium levels for further drop.  13. Abnormal LFTs: AST-876 and ALT 738 at admission likely due to shocked liver v/s excessive tylenol use PTA due to dental pain/procedure --4/20 LFT's substantially improved today -can resume tylenol as needed 14. ABLA: Monitor H/H with serial checks. Did drop from 12.2-->8.5 -- 4/20 hgb up to 9.2, stable 16. OIC: On Miralax and colace-->increase miralax to bid as no BM since admission             --Sorbitol prn.    -large bm 4/19 17. Recurrent melanoma s/p resection 02/2022: Completed Tx/on surveillance.   18. Hypothyroid: Continue Synthroid 19. Code status: Dicussed w/patient who wants to be full code.  20. Suboptimal vitamin D: start ergocalciferol 50,000U once per week for 7 weeks 21. Overweight with hyperglycemia: magnesium level reviewed and is 1.9, give 1 gram IV supplementation 22. Hyponatremia: repeat Na odered for Wednesday, discussed level with patient    LOS: 4 days A FACE TO FACE EVALUATION WAS PERFORMED  Drema Pry Tyr Franca 05/05/2022, 12:50 PM

## 2022-05-05 NOTE — Discharge Instructions (Signed)
Inpatient Rehab Discharge Instructions  MORGANNE HAILE Discharge date and time:  05/07/22  Activities/Precautions/ Functional Status: Activity: no lifting, driving, or strenuous exercise till cleared by MD Wear brace at edge of bed and whenever up. Diet: diabetic diet Need to drink at least 8 glasses of water/fluid per day.  Wound Care: none needed   Functional status:  ___ No restrictions     ___ Walk up steps independently ___ 24/7 supervision/assistance   ___ Walk up steps with assistance _X__ Intermittent supervision/assistance  ___ Bathe/dress independently ___ Walk with walker     _X__ Bathe/dress with assistance ___ Walk Independently    ___ Shower independently ___ Walk with assistance    ___ Shower with assistance _X__ No alcohol     ___ Return to work/school ________  COMMUNITY REFERRALS UPON DISCHARGE:    Home Health:   PT     OT                    Agency:[facility]  Phone: [phone]     Special Instructions:    My questions have been answered and I understand these instructions. I will adhere to these goals and the provided educational materials after my discharge from the hospital.  Patient/Caregiver Signature _______________________________ Date __________  Clinician Signature _______________________________________ Date __________  Please bring this form and your medication list with you to all your follow-up doctor's appointments.

## 2022-05-06 ENCOUNTER — Other Ambulatory Visit (HOSPITAL_COMMUNITY): Payer: Self-pay

## 2022-05-06 DIAGNOSIS — T1490XA Injury, unspecified, initial encounter: Secondary | ICD-10-CM | POA: Diagnosis not present

## 2022-05-06 LAB — GLUCOSE, CAPILLARY
Glucose-Capillary: 135 mg/dL — ABNORMAL HIGH (ref 70–99)
Glucose-Capillary: 93 mg/dL (ref 70–99)
Glucose-Capillary: 84 mg/dL (ref 70–99)
Glucose-Capillary: 231 mg/dL — ABNORMAL HIGH (ref 70–99)

## 2022-05-06 LAB — BASIC METABOLIC PANEL
Anion gap: 12 (ref 5–15)
BUN: 27 mg/dL — ABNORMAL HIGH (ref 8–23)
CO2: 22 mmol/L (ref 22–32)
Calcium: 9.2 mg/dL (ref 8.9–10.3)
Chloride: 98 mmol/L (ref 98–111)
Creatinine, Ser: 1.04 mg/dL — ABNORMAL HIGH (ref 0.44–1.00)
GFR, Estimated: 56 mL/min — ABNORMAL LOW (ref >60)
Glucose, Bld: 149 mg/dL — ABNORMAL HIGH (ref 70–99)
Potassium: 4.7 mmol/L (ref 3.5–5.1)
Sodium: 132 mmol/L — ABNORMAL LOW (ref 135–145)

## 2022-05-06 LAB — MAGNESIUM: Magnesium: 2.3 mg/dL (ref 1.7–2.4)

## 2022-05-06 NOTE — Progress Notes (Signed)
Inpatient Rehabilitation Care Coordinator Discharge Note   Patient Details  Name: Taylor [name] MRN: [id number] Date of Birth: Jul 04, 1945   Discharge location: Home with spouse  Length of Stay: 6 Days  Discharge activity level: MOD I/cga`  Home/community participation: spouse  Patient response WU:JWJXBJ Literacy - How often do you need to have someone help you when you read instructions, pamphlets, or other written material from your doctor or pharmacy?: Rarely  Patient response YN:WGNFAO Isolation - How often do you feel lonely or isolated from those around you?: Never  Services provided included: MD, RD, PT, OT, RN, SLP, CM, TR, Pharmacy, Neuropsych, SW  Financial Services:  Field seismologist Utilized: Medicare    Choices offered to/list presented to: patient and spouse  Follow-up services arranged:  Home Health, DME Home Health Agency: [facility]    DME : No DME reccomendations    Patient response to transportation need: Is the patient able to respond to transportation needs?: Yes In the past 12 months, has lack of transportation kept you from medical appointments or from getting medications?: No In the past 12 months, has lack of transportation kept you from meetings, work, or from getting things needed for daily living?: No   Patient/Family verbalized understanding of follow-up arrangements:  Yes  Individual responsible for coordination of the follow-up plan: self  Confirmed correct DME delivered: Andria Rhein 05/06/2022    Comments (or additional information):    Andria Rhein

## 2022-05-06 NOTE — Progress Notes (Addendum)
Inpatient Rehabilitation Discharge Medication Review by a Pharmacist  A complete drug regimen review was completed for this patient to identify any potential clinically significant medication issues.  High Risk Drug Classes Is patient taking? Indication by Medication  Antipsychotic No   Anticoagulant No   Antibiotic No   Opioid Yes Tramadol - pain Oxycodone - prn pain  Antiplatelet Yes Clopidogrel, aspirin - CAD, stent  Hypoglycemics/insulin Yes Glipizide - DM  Vasoactive Medication Yes Imdur - CAD  Chemotherapy No   Other Yes Levothyroxine - thyroid Methocarbamol - muscle spasms Sertraline - mood Timolol - glaucoma Alprazolam - prn anxiety Crestor - HLD Vitamin D - supplementation     Type of Medication Issue Identified Description of Issue Recommendation(s)  Drug Interaction(s) (clinically significant)     Duplicate Therapy     Allergy     No Medication Administration End Date     Incorrect Dose     Additional Drug Therapy Needed  Levothyroxine, imdur, lisinopril, metformin Wait for dc medrec - complete  Significant med changes from prior encounter (inform family/care partners about these prior to discharge).    Other       Clinically significant medication issues were identified that warrant physician communication and completion of prescribed/recommended actions by midnight of the next day:  Yes>>resolved  Pharmacist comments:   Time spent performing this drug regimen review (minutes):  20 minutes   Ulyses Southward, PharmD, [place], AAHIVP, CPP Infectious Disease Pharmacist 05/06/2022 7:52 AM

## 2022-05-06 NOTE — Plan of Care (Signed)
  Problem: RH Bathing Goal: LTG Patient will bathe all body parts with assist levels (OT) Description: LTG: Patient will bathe all body parts with assist levels (OT) Outcome: Not Met (add Reason) Flowsheets (Taken 05/06/2022 1543) LTG: Pt will perform bathing with assistance level/cueing: (Not met due to need of cues for staying within spinal precautions) --   Problem: RH Tub/Shower Transfers Goal: LTG Patient will perform tub/shower transfers w/assist (OT) Description: LTG: Patient will perform tub/shower transfers with assist, with/without cues using equipment (OT) Outcome: Not Met (add Reason) Flowsheets (Taken 05/06/2022 1543) LTG: Pt will perform tub/shower stall transfers with assistance level of: (Not met due to balance and coordination deficits) --   Problem: RH Balance Goal: LTG Patient will maintain dynamic standing with ADLs (OT) Description: LTG:  Patient will maintain dynamic standing balance with assist during activities of daily living (OT)  Outcome: Completed/Met   Problem: RH Dressing Goal: LTG Patient will perform lower body dressing w/assist (OT) Description: LTG: Patient will perform lower body dressing with assist, with/without cues in positioning using equipment (OT) Outcome: Completed/Met   Problem: RH Toileting Goal: LTG Patient will perform toileting task (3/3 steps) with assistance level (OT) Description: LTG: Patient will perform toileting task (3/3 steps) with assistance level (OT)  Outcome: Completed/Met   Problem: RH Toilet Transfers Goal: LTG Patient will perform toilet transfers w/assist (OT) Description: LTG: Patient will perform toilet transfers with assist, with/without cues using equipment (OT) Outcome: Completed/Met   Problem: RH Balance Goal: LTG Patient will maintain dynamic standing with ADLs (OT) Description: LTG:  Patient will maintain dynamic standing balance with assist during activities of daily living (OT)  Outcome: Completed/Met    Problem: RH Dressing Goal: LTG Patient will perform lower body dressing w/assist (OT) Description: LTG: Patient will perform lower body dressing with assist, with/without cues in positioning using equipment (OT) Outcome: Completed/Met   Problem: RH Toileting Goal: LTG Patient will perform toileting task (3/3 steps) with assistance level (OT) Description: LTG: Patient will perform toileting task (3/3 steps) with assistance level (OT)  Outcome: Completed/Met   Problem: RH Toilet Transfers Goal: LTG Patient will perform toilet transfers w/assist (OT) Description: LTG: Patient will perform toilet transfers with assist, with/without cues using equipment (OT) Outcome: Completed/Met   Problem: RH Simple Meal Prep Goal: LTG Patient will perform simple meal prep w/assist (OT) Description: LTG: Patient will perform simple meal prep with assistance, with/without cues (OT). Outcome: Not Applicable Flowsheets (Taken 05/06/2022 0754) LTG: Pt will perform simple meal prep with assistance level of: (Discontinued goal as pt does not plan/need to complete this activity and is no longer applicable as her husband plans on completing with total A at DC) --   Problem: RH Light Housekeeping Goal: LTG Patient will perform light housekeeping w/assist (OT) Description: LTG: Patient will perform light housekeeping with assistance, with/without cues (OT). Outcome: Not Applicable Flowsheets (Taken 05/06/2022 0754) LTG: Pt will perform light housekeeping with assistance level of: (Discontinued goal as pt does not plan/need to complete this activity and is no longer applicable as her husband plans on completing with total A at DC) --

## 2022-05-06 NOTE — Progress Notes (Signed)
Patient ID: Taylor [name], female   DOB: 08/25/1945, 77 y.o.   MRN: [id number]  Team Conference Report to Patient/Family  Team Conference discussion was reviewed with the patient and caregiver, including goals, any changes in plan of care and target discharge date.  Patient and caregiver express understanding and are in agreement.  The patient has a target discharge date of 05/07/22.  Sw met with patient and confirmed patient is medically stable for d/c tomorrow.  Spouse will be present for d/c to review medications and d/c paperwork. No additional questions or concerns.   Andria Rhein 05/06/2022, 2:07 PM

## 2022-05-06 NOTE — Progress Notes (Signed)
Occupational Therapy Session Note  Patient Details  Name: Taylor [name] MRN: [id number] Date of Birth: 17-Nov-1945  Today's Date: 05/06/2022 OT Individual Time: 4098-1191 & 4782-9562 OT Individual Time Calculation (min): 55 min & 71 min   Short Term Goals: Week 1:  OT Short Term Goal 1 (Week 1): STGs=LTGs due to patient's length of stay.  Skilled Therapeutic Interventions/Progress Updates:  Session 1 Skilled OT intervention completed with focus on ADL retraining and safety education within a shower context. Pt received upright in bed, agreeable to session. No pain just unrated discomfort in back region reported; pre-medicated. OT offered rest breaks, repositioning and moist heat via shower for pain reduction.  Transitioned to EOB with supervision, good recall of precautions. While retrieved items for shower, challenged pt with attempting to donn TLSO without A as she did with supervision yesterday however pt with poor recall of how to orient the orthotic and manage straps requiring min A.   Stood with supervision using RW via pulling on RW with cues needed to avoid this method. Ambulated with CGA initially using RW due to shuffling and crossing of feet especially with turn entering bathroom, however faded to close supervision for rest of session once BLE "warmed up." Ambulated to toilet, continent of void, able to manage all toileting steps with mod I. Ambulated with CGA to TTB in shower. Able to doff TLSO with only supervision, good recall of doffing method to increase efficiency for next wear, as well as overhead method for shirt.   Waterproof cover applied IV on R arm. Towels applied to back rest for supported sitting during shower per MD order/clearance. Able to bathe at the intermittent supervision level all seated with lateral leans and LH sponge used for LB/peri-areas. Current bandaid on LLQ from lovenox shot noted to be bleeding through and new recent spot with pinpoint size bleed. OT  applied pressure dressing to both spots; NT notified. Able to donn shirt with mod I, TLSO with supervision this time with min cues when using pre-looped method vs all buckles loose. Able to thread LB clothing via figure 4 position, then sit > stand with mod I using RW to pull over hips with overall mod I. Ambulated > recliner using RW. Donned socks with mod I.   Pt remained upright in recliner, with chair alarm on/activated, and with all needs in reach at end of session.  Session 2 Skilled OT intervention completed with focus on bed mobility, ambulatory endurance, dynamic balance. Pt received upright in bed, agreeable to session. No pain reported.  Transitioned to EOB with HOB elevated with supervision. Pt verbalized concern about how she would sleep in standard bed at home. Discussed option of trying bed mobility in standard bed in ADL apartment. Donned TLSO with supervision with time, then mod I sit > stand using RW, then ambulated about 200 ft from room ADL apartment.   OT demonstrated side lying with log roll technique using bed for assist. With pt EOB, she had difficulty simultaneously lowering onto her elbow to her head and lifting feet at same time even with bed rail assist. Per pt habit, she was able to sit EOB then transition one leg up onto the bed at a time with good segmental control for spinal precautions. Pt wanted to try long sitting > supine by walking hands back, but pt with increase in discomfort despite mod trunk A. Discussed modifications she could make such as using a wedge pillow/standard pillows to elevate her head, sleeping in recliner  until discomfort improves, or investing in a long term adjustable mattress to age in place as she reports bed mobility was hard for her at baseline. Handouts provided on wedge pillow and bed rail options for purchase at home.  Ambulated > 200 ft with close supervision using RW to day room, then participated in corn hole activity to address dynamic  standing balance, standing tolerance. Able to maintain balance using RW with mod I. Pt then utilized reacher to retrieve 3/4 of the bean bags from floor level while ambulating with supervision using RW; discussed how this relates to picking items up at home that fall. Intermittent seated rest break then ambulated back to room with same assist to commode. Pt was continent of void/BM; charted. Mod I for toileting steps with slight LOB posteriorly however pt able to self correct. Ambulated to sink for hand hygiene and to EOB with supervision. Mod I doffing of TLSO and supervision transition to upright in bed with use of bed rail and HOB elevated.  Pt remained upright in bed, with bed alarm on/activated, and with all needs in reach at end of session.   Therapy Documentation Precautions:  Precautions Precautions: Fall, Back, Other (comment) Precaution Booklet Issued:  (written spine precautions provided) Precaution Comments: needs reinforcement of education on spine precautions and application to mobility tasks Required Braces or Orthoses: Spinal Brace Spinal Brace: Thoracolumbosacral orthotic, Applied in sitting position Restrictions Weight Bearing Restrictions: No    Therapy/Group: Individual Therapy  Melvyn Novas, MS, OTR/L  05/06/2022, 3:39 PM
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
## 2022-05-06 NOTE — Progress Notes (Signed)
Physical Therapy Discharge Summary  Patient Details  Name: Taylor [name] MRN: [id number] Date of Birth: 11/16/1945  Date of Discharge from PT service:May 06, 2022  Today's Date: 05/06/2022 PT Individual Time: 1301-1340 PT Individual Time Calculation (min): 39 min   Patient has met 8 of 9 long term goals due to improved activity tolerance, improved balance, improved postural control, increased strength, ability to compensate for deficits, improved awareness, and improved coordination. Patient to discharge at an ambulatory level Supervision using RW. Patient's care partner is independent to provide the necessary physical assistance at discharge. Pt will have 24/7 assist from her husband upon discharge.   Reasons goals not met: Pt did not meet bed mobility goal of mod I as pt currently requires supervision due to occasional cues to maintain spinal precautions and use of bedrails.   Recommendation:  Patient will benefit from ongoing skilled PT services in home health setting to continue to advance safe functional mobility, address ongoing impairments in transfers, generalized strengthening and endurance, dynamic standing balance/coordination, gait training, stair navigation, and to minimize fall risk.  Equipment: No equipment provided - pt already has RW  Reasons for discharge: treatment goals met and discharge from hospital  Patient/family agrees with progress made and goals achieved: Yes  Today's Interventions Received pt semi-reclined in bed with NT present checking vitals. Pt agreeable to PT treatment and reported pain was "tolerable". Session with emphasis on discharge planning, functional mobility/transfers, generalized strengthening and endurance, gait training. Went through sensation, MMT, and pain interference questionnaire. Pt transferred supine<>sitting EOB from flat bed with supervision using bedrails and donned TLSO sitting EOB with min A. Donned shoes with max A and stood with  RW and mod I and transferred into WC with RW and mod I. Pt ambulated 17ft x 2 trials with RW and supervision to/from main therapy gym. Pt ambulates at decreased cadence with narrow BOS but no LOB noted. Returned to room and transferred sit<>semi-reclined with supervision. Concluded session with pt semi-reclined in bed with all needs within reach.   PT Discharge Precautions/Restrictions Precautions Precautions: Fall;Back;Other (comment) Required Braces or Orthoses: Spinal Brace Spinal Brace: Thoracolumbosacral orthotic;Applied in sitting position Restrictions Weight Bearing Restrictions: No Pain Interference Pain Interference Pain Effect on Sleep: 1. Rarely or not at all Pain Interference with Therapy Activities: 1. Rarely or not at all Pain Interference with Day-to-Day Activities: 1. 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 Coordination Gross Motor Movements are Fluid and Coordinated: Yes Fine Motor Movements are Fluid and Coordinated: Yes Coordination and Movement Description: Generalized weakness and deconditioning with guarded movements due to pain Finger Nose Finger Test: [facility] bilaterally Heel Shin Test: WFL bilaterally but slow Motor  Motor Motor: Within Functional Limits Motor - Skilled Clinical Observations: Generalized weakness and deconditioning with guarded movements due to pain  Mobility Bed Mobility Bed Mobility: Rolling Right;Rolling Left;Sit to Supine;Supine to Sit Rolling Right: Supervision/verbal cueing Rolling Left:  Supervision/Verbal cueing Supine to Sit: Supervision/Verbal cueing Sit to Supine: Supervision/Verbal cueing Transfers Transfers: Sit to Stand;Stand to Sit;Stand Pivot Transfers Sit to Stand: Independent with assistive device Stand to Sit: Independent with assistive device Stand Pivot Transfers: Independent with assistive device Transfer (Assistive device): Rolling walker Locomotion  Gait Ambulation: Yes Gait Assistance: Supervision/Verbal cueing Gait Distance (Feet): 236 Feet Assistive device: Rolling walker Gait Assistance Details: Verbal cues for technique;Verbal cues for gait pattern Gait Assistance Details: verbal cues for upright posture Gait Gait: Yes Gait Pattern: Impaired Gait Pattern: Decreased step length - right;Decreased step length - left;Decreased stride length;Narrow base of support Gait velocity: decreased Stairs / Additional Locomotion Stairs: Yes Stairs Assistance: Supervision/Verbal cueing Stair Management Technique: Two rails Number of Stairs: 12 Height of Stairs: 6 Ramp: Supervision/Verbal cueing (RW) Pick up small object from the floor assist level: Supervision/Verbal cueing Pick up small object from the floor assistive device: tissue box using reacher Wheelchair Mobility Wheelchair Mobility: No  Trunk/Postural Assessment  Cervical Assessment Cervical Assessment: Within Functional Limits Thoracic Assessment Thoracic Assessment: Exceptions to WFL (TLSO) Lumbar Assessment Lumbar Assessment: Exceptions to WFL (TLSO) Postural Control Postural Control: Within Functional Limits  Balance Balance Balance Assessed: Yes Static Sitting Balance Static Sitting - Balance Support: Feet supported Static Sitting - Level of Assistance: 7: Independent Dynamic Sitting Balance Dynamic Sitting - Balance Support: Feet supported Dynamic Sitting - Level of Assistance: 6: Modified independent (Device/Increase time) Static Standing Balance Static Standing - Balance  Support: During functional activity;Bilateral upper extremity supported (RW) Static Standing - Level of Assistance: 6: Modified independent (Device/Increase time) Dynamic Standing Balance Dynamic Standing - Balance Support: Bilateral upper extremity supported;During functional activity (RW) Dynamic Standing - Level of Assistance: 6: Modified independent (Device/Increase time) Dynamic Standing - Comments: with transfers only Extremity Assessment  RLE Assessment RLE Assessment: Exceptions to [facility] General Strength Comments: tested sitting in WC RLE Strength Right Hip Flexion: 4-/5 Right Hip ABduction: 4/5 Right Hip ADduction: 4/5 Right Knee Flexion: 4-/5 Right Knee Extension: 5/5 Right Ankle Dorsiflexion: 5/5 Right Ankle Plantar Flexion: 5/5 LLE Assessment LLE Assessment: Exceptions to [facility] General Strength Comments: tested sitting in WC LLE Strength Left Hip Flexion: 4/5 Left Hip ABduction: 4/5 Left Hip ADduction: 4/5 Left Knee Flexion: 4+/5 Left Knee Extension: 4+/5 Left Ankle Dorsiflexion: 5/5 Left Ankle Plantar Flexion: 5/5  Anas Reister M Rosemarie Galvis Maryama Kuriakose PT, DPT  05/06/2022, 6:53 AM

## 2022-05-06 NOTE — Progress Notes (Signed)
Physical Therapy Session Note  Patient Details  Name: Taylor [name] MRN: [id number] Date of Birth: Apr 17, 1945  Today's Date: 05/06/2022 PT Individual Time: 0865-7846 PT Individual Time Calculation (min): 28 min   Short Term Goals: Week 1:  PT Short Term Goal 1 (Week 1): = to LTGs based on ELOS  Skilled Therapeutic Interventions/Progress Updates:     Pt received seated in recliner and agrees to therapy. Reports pain is "not bad" in back. PT provides bracing and rest breaks to manage pain. PT assists to don shoes prior to mobility. Pt performs sit to stand from relciner with cues for anterior weight shift and hand placement. Pt ambulates to WC without AD, with cues for sequencing and positioning. WC transport to gym for time management. Pt completes car transfer and ramp navigation with demonstration and cues for sequencing, with pt utilizing L handrail for ramp navigation. Following brief seated rest break, pt performs stand step transfer to Nustep with cues for positioning and hand placement. Pt completes Nustep for endurance training and reciprocal coordination. Pt completes x12:00 at workload of 5 with average steps per minute ~50 at workload of 5. PT provides cues for hand and foot placement and completing full available ROM. Pt ambulates back to room with CGA. Left seated in recliner with all needs within reach.    Therapy Documentation Precautions:  Precautions Precautions: Fall, Back, Other (comment) Precaution Booklet Issued:  (written spine precautions provided) Precaution Comments: needs reinforcement of education on spine precautions and application to mobility tasks Required Braces or Orthoses: Spinal Brace Spinal Brace: Thoracolumbosacral orthotic, Applied in sitting position Restrictions Weight Bearing Restrictions: No   Therapy/Group: Individual Therapy  Beau Fanny, PT, DPT 05/06/2022, 5:00 PM

## 2022-05-06 NOTE — Progress Notes (Signed)
Occupational Therapy Discharge Summary  Patient Details  Name: Taylor [name] MRN: [id number] Date of Birth: September 16, 1945  Date of Discharge from OT service:May 06, 2022  Patient has met 4 of 6 long term goals due to improved activity tolerance, improved balance, functional use of  all extremity, improved awareness, and improved coordination.  Patient to discharge at overall Modified Independent to Supervision level.  Patient's care partner is independent to provide the necessary physical assistance at discharge.    Reasons goals not met: Tub/shower transfer goal not met at mod I level due to balance and coordination deficits requiring CGA to complete. Bathing goal not met at mod I vs supervision due to frequent cues needed for safety and spinal precautions while TLSO is off  Recommendation:  Patient will benefit from ongoing skilled OT services in home health setting to continue to advance functional skills in the area of BADL, iADL, and Reduce care partner burden.  Equipment: No equipment provided  Reasons for discharge: treatment goals met  Patient/family agrees with progress made and goals achieved: Yes  OT Discharge Precautions/Restrictions  Precautions Precautions: Fall;Back;Other (comment) Required Braces or Orthoses: Spinal Brace Spinal Brace: Thoracolumbosacral orthotic;Applied in sitting position Restrictions Weight Bearing Restrictions: No ADL ADL Equipment Provided: Long-handled sponge Eating: Independent Where Assessed-Eating: Chair Grooming: Modified independent Where Assessed-Grooming: Standing at sink Upper Body Bathing: Supervision/safety Where Assessed-Upper Body Bathing: Shower Lower Body Bathing: Supervision/safety Where Assessed-Lower Body Bathing: Shower Upper Body Dressing: Supervision/safety Where Assessed-Upper Body Dressing: Edge of bed Lower Body Dressing: Modified independent Where Assessed-Lower Body Dressing: Edge of bed Toileting: Modified  independent Where Assessed-Toileting: Toilet, Psychiatrist Transfer: Modified independent Statistician Method: Surveyor, minerals: Raised toilet seat, Grab bars Tub/Shower Transfer: Scientific laboratory technician Method: Ship broker: Information systems manager without back, Acupuncturist: Close supervision Film/video editor Method: Designer, industrial/product: Shower seat with back, Grab bars ADL Comments: Pt owns shower seat. Vision Baseline Vision/History: 1 Wears glasses Patient Visual Report: No change from baseline Vision Assessment?: Vision impaired- to be further tested in functional context Additional Comments: reports decreased acuity in R eye with blurring since starting chemo medication but no acute changes after the fall Perception  Perception: Within Functional Limits Praxis Praxis: Intact Cognition Cognition Overall Cognitive Status: Within Functional Limits for tasks assessed Arousal/Alertness: Awake/alert Orientation Level: Person;Place;Situation Person: Oriented Place: Oriented Situation: Oriented Memory: Appears intact Awareness: Appears intact Problem Solving: Appears intact Safety/Judgment: Appears intact Brief Interview for Mental Status (BIMS) Repetition of Three Words (First Attempt): 3 Temporal Orientation: Year: Correct Temporal Orientation: Month: Accurate within 5 days Temporal Orientation: Day: Correct Recall: "Sock": Yes, no cue required Recall: "Blue": Yes, no cue required Recall: "Bed": Yes, no cue required BIMS Summary Score: 15 Sensation Sensation Light Touch: Impaired Detail Peripheral sensation comments: pt reports hx of neuropathy diagnosis with numbness/tingling in lower legs especially at night Light Touch Impaired Details: Impaired RLE;Impaired LLE Hot/Cold: Not tested Proprioception: Appears Intact Stereognosis: Not tested Coordination Gross Motor  Movements are Fluid and Coordinated: No Fine Motor Movements are Fluid and Coordinated: Yes Coordination and Movement Description: Decreased fluidity due to increased pain and generalized weakness/deconditioning, and frequency of shuffling with gait Motor  Motor Motor: Within Functional Limits Motor - Skilled Clinical Observations: Generalized weakness and deconditioning with guarded movements due to pain Mobility  Bed Mobility Bed Mobility: Rolling Right;Rolling Left;Sit to Supine;Supine to Sit Rolling Right: Supervision/verbal cueing Rolling Left: Supervision/Verbal cueing Supine to Sit: Supervision/Verbal cueing  Sit to Supine: Supervision/Verbal cueing Transfers Sit to Stand: Independent with assistive device Stand to Sit: Independent with assistive device  Trunk/Postural Assessment  Cervical Assessment Cervical Assessment: Within Functional Limits Thoracic Assessment Thoracic Assessment: Exceptions to WFL (TLSO) Lumbar Assessment Lumbar Assessment: Exceptions to WFL (TLSO) Postural Control Postural Control: Within Functional Limits  Balance Balance Balance Assessed: Yes Static Sitting Balance Static Sitting - Balance Support: Feet supported Static Sitting - Level of Assistance: 7: Independent Dynamic Sitting Balance Dynamic Sitting - Balance Support: Feet supported Dynamic Sitting - Level of Assistance: 6: Modified independent (Device/Increase time) Static Standing Balance Static Standing - Balance Support: During functional activity;Bilateral upper extremity supported (RW) Static Standing - Level of Assistance: 6: Modified independent (Device/Increase time) Dynamic Standing Balance Dynamic Standing - Balance Support: Bilateral upper extremity supported;During functional activity (RW) Dynamic Standing - Level of Assistance: 6: Modified independent (Device/Increase time) Dynamic Standing - Comments: with transfers only Extremity/Trunk Assessment RUE Assessment RUE  Assessment: Within Functional Limits LUE Assessment LUE Assessment: Within Functional Limits   Cadi Rhinehart E Katilin Raynes, MS, OTR/L  05/06/2022, 3:43 PM

## 2022-05-06 NOTE — Plan of Care (Signed)
  Problem: RH Simple Meal Prep Goal: LTG Patient will perform simple meal prep w/assist (OT) Description: LTG: Patient will perform simple meal prep with assistance, with/without cues (OT). Outcome: Not Applicable Flowsheets (Taken 05/06/2022 0754) LTG: Pt will perform simple meal prep with assistance level of: (Discontinued goal as pt does not plan/need to complete this activity and is no longer applicable as her husband plans on completing with total A at DC) --   Problem: RH Light Housekeeping Goal: LTG Patient will perform light housekeeping w/assist (OT) Description: LTG: Patient will perform light housekeeping with assistance, with/without cues (OT). Outcome: Not Applicable Flowsheets (Taken 05/06/2022 0754) LTG: Pt will perform light housekeeping with assistance level of: (Discontinued goal as pt does not plan/need to complete this activity and is no longer applicable as her husband plans on completing with total A at DC) --

## 2022-05-06 NOTE — Progress Notes (Signed)
PROGRESS NOTE   Subjective/Complaints: Feeling well Feels ready for d/c tomorrow, discussed d/c tomorrow Na trending upward slightly  ROS: Patient denies fever, rash, sore throat, blurred vision, dizziness, nausea, vomiting, diarrhea, cough, shortness of breath or chest pain,  headache, or mood change. +abdominal bloating   Objective:   No results found. No results for input(s): "WBC", "HGB", "HCT", "PLT" in the last 72 hours.  Recent Labs    05/04/22 0612 05/06/22 0546  NA 131* 132*  K 4.6 4.7  CL 97* 98  CO2 24 22  GLUCOSE 169* 149*  BUN 26* 27*  CREATININE 0.93 1.04*  CALCIUM 9.2 9.2    Intake/Output Summary (Last 24 hours) at 05/06/2022 0941 Last data filed at 05/06/2022 0716 Gross per 24 hour  Intake 580 ml  Output --  Net 580 ml        Physical Exam: Vital Signs Blood pressure 125/61, pulse (!) 52, temperature 97.6 F (36.4 C), resp. rate 16, height 5' 7.5" (1.715 m), weight 86.3 kg, SpO2 95 %.  Constitutional: No distress . Vital signs reviewed. BMI 29.36 HEENT: NCAT, EOMI, oral membranes moist Neck: supple Cardiovascular: Bradycardia Respiratory/Chest: CTA Bilaterally without wheezes or rales. Normal effort, O2 [place]    GI/Abdomen: BS +, non-tender, non-distended Ext: no clubbing, cyanosis, or edema Psych: pleasant and cooperative  Skin: Clean and intact without signs of breakdown. Abrasion RLE, bruises on either arm Neuro:  Alert and oriented x 3. Normal insight and awareness. Intact Memory. Normal language and speech. Cranial nerve exam unremarkable. MMT: UE limited by chest/back pain but at least 4/5. LE 3- prox to 4/5 distally. No sensory findings. Normal tone. .   Musculoskeletal: back and chest wall discomfort with inspiration, palpation and rom.  Thoracolumbosacral orthotic is in place   Assessment/Plan: 1. Functional deficits which require 3+ hours per day of interdisciplinary therapy in a  comprehensive inpatient rehab setting. Physiatrist is providing close team supervision and 24 hour management of active medical problems listed below. Physiatrist and rehab team continue to assess barriers to discharge/monitor patient progress toward functional and medical goals  Care Tool:  Bathing    Body parts bathed by patient: Right arm, Left arm, Chest, Abdomen, Front perineal area, Right upper leg, Left upper leg, Right lower leg, Left lower leg, Face, Buttocks     Body parts n/a: Buttocks   Bathing assist Assist Level: Supervision/Verbal cueing     Upper Body Dressing/Undressing Upper body dressing   What is the patient wearing?: Pull over shirt, Orthosis Orthosis activity level: Performed by patient  Upper body assist Assist Level: Supervision/Verbal cueing    Lower Body Dressing/Undressing Lower body dressing      What is the patient wearing?: Underwear/pull up, Pants     Lower body assist Assist for lower body dressing: Independent with assitive device     Toileting Toileting    Toileting assist Assist for toileting: Independent with assistive device     Transfers Chair/bed transfer  Transfers assist     Chair/bed transfer assist level: Supervision/Verbal cueing     Locomotion Ambulation   Ambulation assist      Assist level: Supervision/Verbal cueing Assistive device: Walker-rolling Max  distance: 253ft   Walk 10 feet activity   Assist     Assist level: Supervision/Verbal cueing Assistive device: Walker-rolling   Walk 50 feet activity   Assist    Assist level: Supervision/Verbal cueing Assistive device: Walker-rolling    Walk 150 feet activity   Assist Walk 150 feet activity did not occur: Safety/medical concerns  Assist level: Supervision/Verbal cueing Assistive device: Walker-rolling    Walk 10 feet on uneven surface  activity   Assist Walk 10 feet on uneven surfaces activity did not occur: Safety/medical  concerns         Wheelchair     Assist Is the patient using a wheelchair?: Yes (only for transport) Type of Wheelchair: Manual    Wheelchair assist level: Dependent - Patient 0%      Wheelchair 50 feet with 2 turns activity    Assist        Assist Level: Dependent - Patient 0%   Wheelchair 150 feet activity     Assist      Assist Level: Dependent - Patient 0%   Blood pressure 125/61, pulse (!) 52, temperature 97.6 F (36.4 C), resp. rate 16, height 5' 7.5" (1.715 m), weight 86.3 kg, SpO2 95 %.  Medical Problem List and Plan: 1. Functional deficits secondary to polytrauma including right 11-12th rib fx with PTX, T11 Vertebral body fx, lumbar spine fx's             -patient may shower with brace off and back supported.              -ELOS/Goals: 6-9 days, mod I goals             -apply TLSO in sitting position  -Continue CIR therapies including PT, OT  2.  Antithrombotics: -DVT/anticoagulation:  Pharmaceutical: Lovenox--will decrease to 40 mg daily             -antiplatelet therapy: DAPT resumed today (04/19). Recheck CBC in am.  3. Pain Management: D/c tylenol 1000 mg qid due to abnormal LFTs. Continue robaxin 1000 TID.    -scheduled tramadol 50 mg qid.  --Oxycodone prn.  4/21 pain mgt reasonable--may use tylenol prn 4. Mood/Behavior/Sleep: LCSW to follow for evaluation and support.              -antipsychotic agents: N/A             --continue Xanax prn as at home for insomnia.  5. Neuropsych/cognition: This patient is capable of making decisions on her own behalf. 6. Skin/Wound Care: Routine pressure relief measures.  7. Fluids/Electrolytes/Nutrition: encourage PO  I personally reviewed the patient's labs today.    4/21-mild hyponatremia 130--recheck Monday  8. Right rib Fx/R-PTX:  Encourage pulmonary hygiene. Flutter valve added.             --SOB/DOE w/activity- ->wean oxygen to off as tolerated.  9. T2DM: Hgb A1c- 7.6 and reasonably controlled.  Was on glipizide and metformin PTA. --Monitor BS ac/hs and use SSI for elevated BS CBG (last 3)  Recent Labs    05/05/22 1627 05/05/22 2033 05/06/22 0621  GLUCAP 170* 129* 135*   -resumed low dose glipizide 4/20--increase to  daily 4/21             --continue to hold metformin--                         10. Acute on chronic renal failure: encouraged 6-8 glasses of water per day 11.  CAD s/p CABG: Monitor for symptoms with increase in activity             --on Imdur and off Lisinopril for AKI. DAPT. Resumed. Continue to hold Crestor.  12. H/o depression w/ anxiety: Continue Zoloft w/ xanax prn.  --monitor sodium levels for further drop.  13. Abnormal LFTs: AST-876 and ALT 738 at admission likely due to shocked liver v/s excessive tylenol use PTA due to dental pain/procedure --4/20 LFT's substantially improved today -can resume tylenol as needed 14. ABLA: Monitor H/H with serial checks. Did drop from 12.2-->8.5 -- 4/20 hgb up to 9.2, stable 16. OIC: On Miralax and colace-->increase miralax to bid as no BM since admission             --Sorbitol prn.    -large bm 4/19 17. Recurrent melanoma s/p resection 02/2022: Completed Tx/on surveillance.   18. Hypothyroid: continue Synthroid 19. Code status: Dicussed w/patient who wants to be full code.  20. Suboptimal vitamin D: continue ergocalciferol 50,000U once per week for 7 weeks 21. Overweight with hyperglycemia: magnesium level reviewed and is 1.9, give 1 gram IV supplementation, add on magnesium level today 22. Hyponatremia: Na reviewed and is stable at 132, monitor outpatient    LOS: 5 days A FACE TO FACE EVALUATION WAS PERFORMED  Taylor [name] [name] 05/06/2022, 9:41 AM

## 2022-05-07 ENCOUNTER — Other Ambulatory Visit (HOSPITAL_COMMUNITY): Payer: Self-pay

## 2022-05-07 DIAGNOSIS — T1490XA Injury, unspecified, initial encounter: Secondary | ICD-10-CM | POA: Diagnosis not present

## 2022-05-07 LAB — GLUCOSE, CAPILLARY: Glucose-Capillary: 140 mg/dL — ABNORMAL HIGH (ref 70–99)

## 2022-05-07 MED ORDER — POLYETHYLENE GLYCOL 3350 17 GM/SCOOP PO POWD
17.0000 g | Freq: Every day | ORAL | 0 refills | Status: DC
  Filled 2022-05-07: qty 238, 14d supply, fill #0

## 2022-05-07 NOTE — Progress Notes (Signed)
PROGRESS NOTE   Subjective/Complaints: Discussed with patient that she is stable for d/c today She has no new complaints Discussed that we will go over medications  ROS: Patient denies fever, rash, sore throat, blurred vision, dizziness, nausea, vomiting, diarrhea, cough, shortness of breath or chest pain,  headache, or mood change. +abdominal bloating   Objective:   No results found. No results for input(s): "WBC", "HGB", "HCT", "PLT" in the last 72 hours.  Recent Labs    05/06/22 0546  NA 132*  K 4.7  CL 98  CO2 22  GLUCOSE 149*  BUN 27*  CREATININE 1.04*  CALCIUM 9.2    Intake/Output Summary (Last 24 hours) at 05/07/2022 0955 Last data filed at 05/07/2022 0731 Gross per 24 hour  Intake 535 ml  Output --  Net 535 ml        Physical Exam: Vital Signs Blood pressure (!) 143/64, pulse (!) 53, temperature 97.8 F (36.6 C), resp. rate 16, height 5' 7.5" (1.715 m), weight 86.3 kg, SpO2 94 %.  Constitutional: No distress . Vital signs reviewed. BMI 29.36 HEENT: NCAT, EOMI, oral membranes moist Neck: supple Cardiovascular: Bradycardia Respiratory/Chest: CTA Bilaterally without wheezes or rales. Normal effort, O2 [place]    GI/Abdomen: BS +, non-tender, non-distended Ext: no clubbing, cyanosis, or edema Psych: pleasant and cooperative  Skin: Clean and intact without signs of breakdown. Abrasion RLE, bruises on either arm Neuro:  Alert and oriented x 3. Normal insight and awareness. Intact Memory. Normal language and speech. Cranial nerve exam unremarkable. MMT: UE limited by chest/back pain but at least 4/5. LE 3- prox to 4/5 distally. No sensory findings. Normal tone. .   Musculoskeletal: back and chest wall discomfort with inspiration, palpation and rom.  Thoracolumbosacral orthotic is in place Ambulating without AD   Assessment/Plan: 1. Functional deficits which require 3+ hours per day of interdisciplinary  therapy in a comprehensive inpatient rehab setting. Physiatrist is providing close team supervision and 24 hour management of active medical problems listed below. Physiatrist and rehab team continue to assess barriers to discharge/monitor patient progress toward functional and medical goals  Care Tool:  Bathing    Body parts bathed by patient: Right arm, Left arm, Chest, Abdomen, Front perineal area, Right upper leg, Left upper leg, Right lower leg, Left lower leg, Face, Buttocks     Body parts n/a: Buttocks   Bathing assist Assist Level: Supervision/Verbal cueing     Upper Body Dressing/Undressing Upper body dressing   What is the patient wearing?: Pull over shirt, Orthosis Orthosis activity level: Performed by patient  Upper body assist Assist Level: Supervision/Verbal cueing    Lower Body Dressing/Undressing Lower body dressing      What is the patient wearing?: Underwear/pull up, Pants     Lower body assist Assist for lower body dressing: Independent with assitive device     Toileting Toileting    Toileting assist Assist for toileting: Independent with assistive device     Transfers Chair/bed transfer  Transfers assist     Chair/bed transfer assist level: Independent with assistive device Chair/bed transfer assistive device: Geophysical data processor  level: Supervision/Verbal cueing Assistive device: Walker-rolling Max distance: 261ft   Walk 10 feet activity   Assist     Assist level: Supervision/Verbal cueing Assistive device: Walker-rolling   Walk 50 feet activity   Assist    Assist level: Supervision/Verbal cueing Assistive device: Walker-rolling    Walk 150 feet activity   Assist Walk 150 feet activity did not occur: Safety/medical concerns  Assist level: Supervision/Verbal cueing Assistive device: Walker-rolling    Walk 10 feet on uneven surface  activity   Assist Walk 10 feet on  uneven surfaces activity did not occur: Safety/medical concerns   Assist level: Supervision/Verbal cueing Assistive device: Walker-rolling   Wheelchair     Assist Is the patient using a wheelchair?: No Type of Wheelchair: Manual Wheelchair activity did not occur: N/A  Wheelchair assist level: Dependent - Patient 0%      Wheelchair 50 feet with 2 turns activity    Assist    Wheelchair 50 feet with 2 turns activity did not occur: N/A   Assist Level: Dependent - Patient 0%   Wheelchair 150 feet activity     Assist  Wheelchair 150 feet activity did not occur: N/A   Assist Level: Dependent - Patient 0%   Blood pressure (!) 143/64, pulse (!) 53, temperature 97.8 F (36.6 C), resp. rate 16, height 5' 7.5" (1.715 m), weight 86.3 kg, SpO2 94 %.  Medical Problem List and Plan: 1. Functional deficits secondary to polytrauma including right 11-12th rib fx with PTX, T11 Vertebral body fx, lumbar spine fx's             -patient may shower with brace off and back supported.              -ELOS/Goals: 6-9 days, mod I goals             -apply TLSO in sitting position  -Continue CIR therapies including PT, OT  2.  Antithrombotics: -DVT/anticoagulation:  Pharmaceutical: Lovenox--will decrease to 40 mg daily             -antiplatelet therapy: DAPT resumed today (04/19). Recheck CBC in am.  3. Pain Management: D/c tylenol 1000 mg qid due to abnormal LFTs. Continue robaxin 1000 TID.    -scheduled tramadol 50 mg qid.  --Oxycodone prn.  4/21 pain mgt reasonable--may use tylenol prn 4. Mood/Behavior/Sleep: LCSW to follow for evaluation and support.              -antipsychotic agents: N/A             --continue Xanax prn as at home for insomnia.  5. Neuropsych/cognition: This patient is capable of making decisions on her own behalf. 6. Skin/Wound Care: Routine pressure relief measures.  7. Fluids/Electrolytes/Nutrition: encourage PO  I personally reviewed the patient's labs today.     4/21-mild hyponatremia 130--recheck Monday  8. Right rib Fx/R-PTX:  Encourage pulmonary hygiene. Flutter valve added.             --SOB/DOE w/activity- ->wean oxygen to off as tolerated.  9. T2DM: Hgb A1c- 7.6 and reasonably controlled. Was on glipizide and metformin PTA. --Monitor BS ac/hs and use SSI for elevated BS CBG (last 3)  Recent Labs    05/06/22 1626 05/06/22 2048 05/07/22 0618  GLUCAP 84 231* 140*   -resumed low dose glipizide 4/20--increase to  daily 4/21             --continue to hold metformin--  10. Acute on chronic renal failure: encouraged 6-8 glasses of water per day 11. CAD s/p CABG: Monitor for symptoms with increase in activity             --on Imdur and off Lisinopril for AKI. DAPT. Resumed. Continue to hold Crestor.  12. H/o depression w/ anxiety: continue Zoloft w/ xanax prn.  --monitor sodium levels for further drop.  13. Abnormal LFTs: AST-876 and ALT 738 at admission likely due to shocked liver v/s excessive tylenol use PTA due to dental pain/procedure --4/20 LFT's substantially improved today -can resume tylenol as needed 14. ABLA: Monitor H/H with serial checks. Did drop from 12.2-->8.5 -- 4/20 hgb up to 9.2, stable 16. OIC: On Miralax and colace-->increase miralax to bid as no BM since admission             --Sorbitol prn.    -large bm 4/19 17. Recurrent melanoma s/p resection 02/2022: Completed Tx/on surveillance.   18. Hypothyroid: continue Synthroid 19. Code status: Dicussed w/patient who wants to be full code.  20. Suboptimal vitamin D: continue ergocalciferol 50,000U once per week for 7 weeks 21. Overweight with hyperglycemia: magnesium level reviewed and is 1.9, give 1 gram IV supplementation, reviewed and improved to 2.3 22. Hyponatremia: Na reviewed and is stable at 132, monitor outpatient    LOS: 6 days A FACE TO FACE EVALUATION WAS PERFORMED  Drema Pry Erskine Steinfeldt 05/07/2022, 9:55 AM

## 2022-05-07 NOTE — Progress Notes (Signed)
INPATIENT REHABILITATION DISCHARGE NOTE   Discharge instructions by: Marissa Nestle PA  Verbalized understanding: Yes.   Pain: Scheduled Tramadol given   IV's: right Forearm IV removed   Safety instructions: Given to patient  Patient belongings:Packed by Nurse and tech  Discharged [id number] with husband  Discharged [id number] was wheeled to car in wheelchair  Notes: Patient received medication from [facility] pharmacy Cletis Media, RN

## 2022-05-07 NOTE — Discharge Summary (Signed)
Physician Discharge Summary  Patient ID: Taylor [name] MRN: [id number] DOB/AGE: April 27, 1945 77 y.o.  Admit date: 05/01/2022 Discharge date: 05/07/2022  Discharge Diagnoses:  Principal Problem:   Trauma Active Problems:   Hypertension   Type II diabetes mellitus   OSA (obstructive sleep apnea)   Diabetic neuropathy   Rib fractures  Hyponatremia Pre-renal azotemia   Discharged Condition: good  Significant Diagnostic Studies:N/a   Labs:  Basic Metabolic Panel: Recent Labs  Lab 05/02/22 0534 05/04/22 0612 05/04/22 1448 05/06/22 0545 05/06/22 0546  NA 130* 131*  --   --  132*  K 4.6 4.6  --   --  4.7  CL 100 97*  --   --  98  CO2 20* 24  --   --  22  GLUCOSE 149* 169*  --   --  149*  BUN 19 26*  --   --  27*  CREATININE 0.94 0.93  --   --  1.04*  CALCIUM 8.4* 9.2  --   --  9.2  MG  --   --  1.9 2.3  --     CBC:    Latest Ref Rng & Units 05/02/2022    5:34 AM 04/30/2022    1:04 AM 04/28/2022    8:52 AM  CBC  WBC 4.0 - 10.5 K/uL 9.7  10.5  8.5   Hemoglobin 12.0 - 15.0 g/dL 9.2  8.5  9.2   Hematocrit 36.0 - 46.0 % 27.9  25.1  27.1   Platelets 150 - 400 K/uL 341  251  199      CBG: Recent Labs  Lab 05/06/22 0621 05/06/22 1111 05/06/22 1626 05/06/22 2048 05/07/22 0618  GLUCAP 135* 93 84 231* 140*      Latest Ref Rng & Units 05/02/2022    5:34 AM 04/24/2022    4:08 PM 01/09/2017    3:33 PM  Hepatic Function  Total Protein 6.5 - 8.1 g/dL 6.5  7.3  8.5   Albumin 3.5 - 5.0 g/dL 2.5  4.0  4.3   AST 15 - 41 U/L 41  876  17   ALT 0 - 44 U/L 60  738  12   Alk Phosphatase 38 - 126 U/L 142  58  51   Total Bilirubin 0.3 - 1.2 mg/dL 0.8  1.0  0.8      Brief HPI:   Taylor [name] is a 77 y.o. female with history of CAD, T2DM, recurrent melanoma, breast cancer who was admitted on 04/24/22 after falling thru a deck. She was hypotensive enroute and lactic acidosis treated with IVF. She was found to have right rib Fx, small right PTX, acute non displaced T11 vertebral  body Fx, L1-L3 transverse process fracture. TLSO ordered by Dr. Yetta Barre with follow up upright films without distraction or compression. Hospital course complicated by progressive shortness of breath with severe hypoxia due to right pneumothorax.  This was treated with chest tube placement and respiratory status improved and CT was removed by 04/18.  Follow-up chest x-ray was negative for PTX and she was in process of being weaned off oxygen.  Hospital course was also significant for AKI treated with gentle IV fluids as well as acute blood loss anemia requiring 1 unit PRBC.  Therapy was consulted and was working with patient.  She was limited by pain, weakness, shortness of breath with activity as well as dizziness.  CIR was recommended due to functional decline.   Hospital Course: Taylor [name]  was admitted to rehab 05/01/2022 for inpatient therapies to consist of PT and OT at least three hours five days a week. Past admission physiatrist, therapy team and rehab RN have worked together to provide customized collaborative inpatient rehab.  Her blood pressures were monitored on TID basis and have been stable.  Hyponatremia has been monitored during the stay and is slowly trending upwards.  Tylenol was DC'd due to abnormal LFTs and she continues on Robaxin 1000 mg 3 times daily.  Follow-up labs showed abnormal LFTs to be greatly improved Entresto was resumed at discharge.  Tramadol scheduled 50 mg 4 times daily with Oxy IR being used on as needed basis.  OIC has been managed with titration of bowel meds.  Shortness of breath with DOE has resolved and she has been weaned off oxygen.    Her diabetes has been monitored with ac/hs CBG checks and SSI was use prn for tighter BS control.  Glipizide was resumed at lower dose and titrated to home dose of 10 mg/day x 04/21.  Metformin continues to be on hold with blood sugars currently ranging from 82-30 range.  She was advised to monitor blood sugars on 4 times daily basis  and follow-up with PCP for input on resumption of metformin.  Vitamin D levels were noted to be suboptimal at 31.96 and she was started on ergocalciferol for supplement.  Follow-up CBC shows acute blood loss anemia to be relatively stable.  Check of renal showed recurrent acute on chronic renal failure and she was advised on increasing fluid intake at discharge.  Her melanoma resection site is healing well without signs or symptoms of infection.  She has made good gains during her stay and supervision is recommended for safety.  She will continue to receive follow-up home health PT and OT by [facility] home health after discharge.   Rehab course: During patient's stay in rehab weekly team conferences were held to monitor patient's progress, set goals and discuss barriers to discharge. At admission, patient required contact-guard to min assist for ADL tasks and min assist for mobility. She  has had improvement in activity tolerance, balance, postural control as well as ability to compensate for deficits.  She is able to complete ADL tasks at modified independent to supervision level. She requires supervision with cues for bed mobility, is independent for sit to stand transfers and requires supervision to ambulate to 236 feet with rolling walker and cues for upright posture and gait.  Husband will provide assistance as needed after discharge.  Disposition: home  Diet: Heart healthy  Special Instructions: Recommend repeat check of LFTs and CMET in couple weeks to monitor sodium levels, renal status and further improvement in LFTs. 2.  Monitor blood sugars 4 times daily basis and follow-up with PCP for input on resumption of metformin. 3.  Do not brace at edge of bed.  No driving.  Allergies as of 05/07/2022       Reactions   Niacin Other (See Comments)   burning   Duloxetine    Other reaction(s): Other (See Comments)   Amoxicillin Rash   Ibuprofen Rash   Lipitor [atorvastatin] Other (See Comments)    Muscle pain        Medication List     STOP taking these medications    lisinopril 10 MG tablet Commonly known as: ZESTRIL   metFORMIN 500 MG tablet Commonly known as: GLUCOPHAGE   pioglitazone 15 MG tablet Commonly known as: ACTOS  TAKE these medications    acetaminophen 500 MG tablet Commonly known as: TYLENOL Take 1,000 mg by mouth every 6 (six) hours as needed for headache (pain).   ALPRAZolam 0.25 MG tablet Commonly known as: XANAX Take 0.25 mg by mouth daily as needed for anxiety.   aspirin 81 MG tablet Take 81 mg by mouth daily.   clopidogrel 75 MG tablet Commonly known as: PLAVIX Take 1 tablet (75 mg total) by mouth daily.   docusate sodium 100 MG capsule Commonly known as: COLACE Take 1 capsule (100 mg total) by mouth 2 (two) times daily.   glipiZIDE 5 MG tablet Commonly known as: GLUCOTROL Take 10 mg by mouth daily.   isosorbide mononitrate 60 MG 24 hr tablet Commonly known as: IMDUR Take 1 tablet by mouth once daily   levothyroxine 75 MCG tablet Commonly known as: SYNTHROID Take 75 mcg by mouth daily before breakfast.   methocarbamol 500 MG tablet Commonly known as: ROBAXIN Take 2 tablets (1,000 mg total) by mouth every 8 (eight) hours. Notes to patient: Wean down to 1-2 pills    nitroGLYCERIN 0.4 MG SL tablet Commonly known as: NITROSTAT Place 1 tablet (0.4 mg total) under the tongue every 5 (five) minutes as needed for chest pain.   oxyCODONE 5 MG immediate release tablet--Rx# 10 pills Commonly known as: Oxy IR/ROXICODONE Take 1 tablet (5 mg total) by mouth 2 (two) times daily as needed for moderate pain or severe pain (2.5mg  for moderate pain, 5mg  for severe pain).   polyethylene glycol powder 17 GM/SCOOP powder Commonly known as: GLYCOLAX/MIRALAX Take 1 capful (17 g) by mouth daily.   rosuvastatin 20 MG tablet Commonly known as: CRESTOR TAKE ONE TABLET BY MOUTH ONCE DAILY AT  6PM What changed: See the new instructions.    sertraline 100 MG tablet Commonly known as: ZOLOFT Take 100 mg by mouth daily.   timolol 0.25 % ophthalmic solution Commonly known as: BETIMOL 1-2 drops 2 (two) times daily.   traMADol 50 MG tablet Commonly known as: ULTRAM Take 1 tablet (50 mg total) by mouth 4 (four) times daily -  with meals and at bedtime.   Vitamin D (Ergocalciferol) 1.25 MG (50000 UNIT) Caps capsule Commonly known as: DRISDOL Take 1 capsule (50,000 Units total) by mouth every 7 (seven) days. Start taking on: May 12, 2022 Notes to patient: Take weekly on Tuesdays        Follow-up Information     [name], MD Follow up.   Specialty: Family Medicine Why: Call in 1-2 days for post hospital follow up, X rays and input on brace Contact information: [address] [place] [address] [phone]         Horton Chin, MD. Call.   Specialty: Physical Medicine and Rehabilitation Why: As needed Contact information: [address] [place] [address] [phone]         Tia Alert, MD Follow up.   Specialty: Neurosurgery Why: Call in 1-2 days for post hospital follow up Contact information: [address] [place] [address] [phone]                 Signed: Jacquelynn Cree 05/07/2022, 9:23 AM

## 2022-05-07 NOTE — Progress Notes (Signed)
Patient ID: Taylor [name], female   DOB: July 17, 1945, 77 y.o.   MRN: [id number]  SW met with patient to discuss discharge questions and concerns. Patient ready for d/c.

## 2022-05-08 DIAGNOSIS — S22089A Unspecified fracture of T11-T12 vertebra, initial encounter for closed fracture: Secondary | ICD-10-CM | POA: Diagnosis not present

## 2022-05-08 DIAGNOSIS — F32A Depression, unspecified: Secondary | ICD-10-CM | POA: Diagnosis not present

## 2022-05-08 DIAGNOSIS — Z7902 Long term (current) use of antithrombotics/antiplatelets: Secondary | ICD-10-CM | POA: Diagnosis not present

## 2022-05-08 DIAGNOSIS — E1122 Type 2 diabetes mellitus with diabetic chronic kidney disease: Secondary | ICD-10-CM | POA: Diagnosis not present

## 2022-05-08 DIAGNOSIS — Z7982 Long term (current) use of aspirin: Secondary | ICD-10-CM | POA: Diagnosis not present

## 2022-05-08 DIAGNOSIS — E669 Obesity, unspecified: Secondary | ICD-10-CM | POA: Diagnosis not present

## 2022-05-08 DIAGNOSIS — G4733 Obstructive sleep apnea (adult) (pediatric): Secondary | ICD-10-CM | POA: Diagnosis not present

## 2022-05-08 DIAGNOSIS — E1140 Type 2 diabetes mellitus with diabetic neuropathy, unspecified: Secondary | ICD-10-CM | POA: Diagnosis not present

## 2022-05-08 DIAGNOSIS — I2699 Other pulmonary embolism without acute cor pulmonale: Secondary | ICD-10-CM | POA: Diagnosis not present

## 2022-05-08 DIAGNOSIS — F419 Anxiety disorder, unspecified: Secondary | ICD-10-CM | POA: Diagnosis not present

## 2022-05-08 DIAGNOSIS — E7800 Pure hypercholesterolemia, unspecified: Secondary | ICD-10-CM | POA: Diagnosis not present

## 2022-05-08 DIAGNOSIS — S22039D Unspecified fracture of third thoracic vertebra, subsequent encounter for fracture with routine healing: Secondary | ICD-10-CM | POA: Diagnosis not present

## 2022-05-08 DIAGNOSIS — E039 Hypothyroidism, unspecified: Secondary | ICD-10-CM | POA: Diagnosis not present

## 2022-05-08 DIAGNOSIS — M47816 Spondylosis without myelopathy or radiculopathy, lumbar region: Secondary | ICD-10-CM | POA: Diagnosis not present

## 2022-05-08 DIAGNOSIS — I129 Hypertensive chronic kidney disease with stage 1 through stage 4 chronic kidney disease, or unspecified chronic kidney disease: Secondary | ICD-10-CM | POA: Diagnosis not present

## 2022-05-08 DIAGNOSIS — Z7984 Long term (current) use of oral hypoglycemic drugs: Secondary | ICD-10-CM | POA: Diagnosis not present

## 2022-05-08 DIAGNOSIS — E1151 Type 2 diabetes mellitus with diabetic peripheral angiopathy without gangrene: Secondary | ICD-10-CM | POA: Diagnosis not present

## 2022-05-08 DIAGNOSIS — S22089D Unspecified fracture of T11-T12 vertebra, subsequent encounter for fracture with routine healing: Secondary | ICD-10-CM | POA: Diagnosis not present

## 2022-05-08 DIAGNOSIS — S2241XD Multiple fractures of ribs, right side, subsequent encounter for fracture with routine healing: Secondary | ICD-10-CM | POA: Diagnosis not present

## 2022-05-08 DIAGNOSIS — Z951 Presence of aortocoronary bypass graft: Secondary | ICD-10-CM | POA: Diagnosis not present

## 2022-05-08 DIAGNOSIS — Z85828 Personal history of other malignant neoplasm of skin: Secondary | ICD-10-CM | POA: Diagnosis not present

## 2022-05-08 DIAGNOSIS — N189 Chronic kidney disease, unspecified: Secondary | ICD-10-CM | POA: Diagnosis not present

## 2022-05-08 DIAGNOSIS — K219 Gastro-esophageal reflux disease without esophagitis: Secondary | ICD-10-CM | POA: Diagnosis not present

## 2022-05-08 DIAGNOSIS — S22019D Unspecified fracture of first thoracic vertebra, subsequent encounter for fracture with routine healing: Secondary | ICD-10-CM | POA: Diagnosis not present

## 2022-05-08 DIAGNOSIS — J939 Pneumothorax, unspecified: Secondary | ICD-10-CM | POA: Diagnosis not present

## 2022-05-08 DIAGNOSIS — I2510 Atherosclerotic heart disease of native coronary artery without angina pectoris: Secondary | ICD-10-CM | POA: Diagnosis not present

## 2022-05-08 NOTE — Progress Notes (Signed)
Patient ID: Taylor [name], female   DOB: 02-27-1945, 77 y.o.   MRN: [id number]  [facility] referral updated to [facility]

## 2022-05-11 DIAGNOSIS — Z6827 Body mass index (BMI) 27.0-27.9, adult: Secondary | ICD-10-CM | POA: Diagnosis not present

## 2022-05-11 DIAGNOSIS — E1142 Type 2 diabetes mellitus with diabetic polyneuropathy: Secondary | ICD-10-CM | POA: Diagnosis not present

## 2022-05-11 DIAGNOSIS — I209 Angina pectoris, unspecified: Secondary | ICD-10-CM | POA: Diagnosis not present

## 2022-05-11 DIAGNOSIS — E871 Hypo-osmolality and hyponatremia: Secondary | ICD-10-CM | POA: Diagnosis not present

## 2022-05-11 DIAGNOSIS — F419 Anxiety disorder, unspecified: Secondary | ICD-10-CM | POA: Diagnosis not present

## 2022-05-11 DIAGNOSIS — T07XXXA Unspecified multiple injuries, initial encounter: Secondary | ICD-10-CM | POA: Diagnosis not present

## 2022-05-11 DIAGNOSIS — R748 Abnormal levels of other serum enzymes: Secondary | ICD-10-CM | POA: Diagnosis not present

## 2022-05-11 NOTE — Telephone Encounter (Signed)
Returned patient  husband's call (DPR on FILE) who states that he will call our office back to rescheduled after patient recovers.

## 2022-05-11 NOTE — Telephone Encounter (Signed)
Addition to previous note:  Husband stated best to reach him at [phone].

## 2022-05-11 NOTE — Telephone Encounter (Signed)
Husband stated patient is still recovering from her fall.  Husband wants to re-schedule patient's tele-visit with Pre-op.

## 2022-05-14 DIAGNOSIS — S2241XD Multiple fractures of ribs, right side, subsequent encounter for fracture with routine healing: Secondary | ICD-10-CM | POA: Diagnosis not present

## 2022-05-14 DIAGNOSIS — E1122 Type 2 diabetes mellitus with diabetic chronic kidney disease: Secondary | ICD-10-CM | POA: Diagnosis not present

## 2022-05-14 DIAGNOSIS — S22039D Unspecified fracture of third thoracic vertebra, subsequent encounter for fracture with routine healing: Secondary | ICD-10-CM | POA: Diagnosis not present

## 2022-05-14 DIAGNOSIS — I129 Hypertensive chronic kidney disease with stage 1 through stage 4 chronic kidney disease, or unspecified chronic kidney disease: Secondary | ICD-10-CM | POA: Diagnosis not present

## 2022-05-14 DIAGNOSIS — S22089D Unspecified fracture of T11-T12 vertebra, subsequent encounter for fracture with routine healing: Secondary | ICD-10-CM | POA: Diagnosis not present

## 2022-05-14 DIAGNOSIS — S22019D Unspecified fracture of first thoracic vertebra, subsequent encounter for fracture with routine healing: Secondary | ICD-10-CM | POA: Diagnosis not present

## 2022-05-15 DIAGNOSIS — E1122 Type 2 diabetes mellitus with diabetic chronic kidney disease: Secondary | ICD-10-CM | POA: Diagnosis not present

## 2022-05-15 DIAGNOSIS — I129 Hypertensive chronic kidney disease with stage 1 through stage 4 chronic kidney disease, or unspecified chronic kidney disease: Secondary | ICD-10-CM | POA: Diagnosis not present

## 2022-05-15 DIAGNOSIS — S22089D Unspecified fracture of T11-T12 vertebra, subsequent encounter for fracture with routine healing: Secondary | ICD-10-CM | POA: Diagnosis not present

## 2022-05-15 DIAGNOSIS — S22019D Unspecified fracture of first thoracic vertebra, subsequent encounter for fracture with routine healing: Secondary | ICD-10-CM | POA: Diagnosis not present

## 2022-05-15 DIAGNOSIS — S2241XD Multiple fractures of ribs, right side, subsequent encounter for fracture with routine healing: Secondary | ICD-10-CM | POA: Diagnosis not present

## 2022-05-15 DIAGNOSIS — S22039D Unspecified fracture of third thoracic vertebra, subsequent encounter for fracture with routine healing: Secondary | ICD-10-CM | POA: Diagnosis not present

## 2022-05-19 DIAGNOSIS — I129 Hypertensive chronic kidney disease with stage 1 through stage 4 chronic kidney disease, or unspecified chronic kidney disease: Secondary | ICD-10-CM | POA: Diagnosis not present

## 2022-05-19 DIAGNOSIS — S22039D Unspecified fracture of third thoracic vertebra, subsequent encounter for fracture with routine healing: Secondary | ICD-10-CM | POA: Diagnosis not present

## 2022-05-19 DIAGNOSIS — E1122 Type 2 diabetes mellitus with diabetic chronic kidney disease: Secondary | ICD-10-CM | POA: Diagnosis not present

## 2022-05-19 DIAGNOSIS — S22089D Unspecified fracture of T11-T12 vertebra, subsequent encounter for fracture with routine healing: Secondary | ICD-10-CM | POA: Diagnosis not present

## 2022-05-19 DIAGNOSIS — S22019D Unspecified fracture of first thoracic vertebra, subsequent encounter for fracture with routine healing: Secondary | ICD-10-CM | POA: Diagnosis not present

## 2022-05-19 DIAGNOSIS — S2241XD Multiple fractures of ribs, right side, subsequent encounter for fracture with routine healing: Secondary | ICD-10-CM | POA: Diagnosis not present

## 2022-05-21 DIAGNOSIS — Z6826 Body mass index (BMI) 26.0-26.9, adult: Secondary | ICD-10-CM | POA: Diagnosis not present

## 2022-05-21 DIAGNOSIS — S22088A Other fracture of T11-T12 vertebra, initial encounter for closed fracture: Secondary | ICD-10-CM | POA: Diagnosis not present

## 2022-05-22 DIAGNOSIS — I129 Hypertensive chronic kidney disease with stage 1 through stage 4 chronic kidney disease, or unspecified chronic kidney disease: Secondary | ICD-10-CM | POA: Diagnosis not present

## 2022-05-22 DIAGNOSIS — S2241XD Multiple fractures of ribs, right side, subsequent encounter for fracture with routine healing: Secondary | ICD-10-CM | POA: Diagnosis not present

## 2022-05-22 DIAGNOSIS — S22089D Unspecified fracture of T11-T12 vertebra, subsequent encounter for fracture with routine healing: Secondary | ICD-10-CM | POA: Diagnosis not present

## 2022-05-22 DIAGNOSIS — S22039D Unspecified fracture of third thoracic vertebra, subsequent encounter for fracture with routine healing: Secondary | ICD-10-CM | POA: Diagnosis not present

## 2022-05-22 DIAGNOSIS — S22019D Unspecified fracture of first thoracic vertebra, subsequent encounter for fracture with routine healing: Secondary | ICD-10-CM | POA: Diagnosis not present

## 2022-05-22 DIAGNOSIS — E1122 Type 2 diabetes mellitus with diabetic chronic kidney disease: Secondary | ICD-10-CM | POA: Diagnosis not present

## 2022-05-28 DIAGNOSIS — S2241XD Multiple fractures of ribs, right side, subsequent encounter for fracture with routine healing: Secondary | ICD-10-CM | POA: Diagnosis not present

## 2022-05-28 DIAGNOSIS — S22019D Unspecified fracture of first thoracic vertebra, subsequent encounter for fracture with routine healing: Secondary | ICD-10-CM | POA: Diagnosis not present

## 2022-05-28 DIAGNOSIS — I129 Hypertensive chronic kidney disease with stage 1 through stage 4 chronic kidney disease, or unspecified chronic kidney disease: Secondary | ICD-10-CM | POA: Diagnosis not present

## 2022-05-28 DIAGNOSIS — E1122 Type 2 diabetes mellitus with diabetic chronic kidney disease: Secondary | ICD-10-CM | POA: Diagnosis not present

## 2022-05-28 DIAGNOSIS — S22089D Unspecified fracture of T11-T12 vertebra, subsequent encounter for fracture with routine healing: Secondary | ICD-10-CM | POA: Diagnosis not present

## 2022-05-28 DIAGNOSIS — S22039D Unspecified fracture of third thoracic vertebra, subsequent encounter for fracture with routine healing: Secondary | ICD-10-CM | POA: Diagnosis not present

## 2022-05-29 DIAGNOSIS — S22089D Unspecified fracture of T11-T12 vertebra, subsequent encounter for fracture with routine healing: Secondary | ICD-10-CM | POA: Diagnosis not present

## 2022-05-29 DIAGNOSIS — I129 Hypertensive chronic kidney disease with stage 1 through stage 4 chronic kidney disease, or unspecified chronic kidney disease: Secondary | ICD-10-CM | POA: Diagnosis not present

## 2022-05-29 DIAGNOSIS — S2241XD Multiple fractures of ribs, right side, subsequent encounter for fracture with routine healing: Secondary | ICD-10-CM | POA: Diagnosis not present

## 2022-05-29 DIAGNOSIS — E1122 Type 2 diabetes mellitus with diabetic chronic kidney disease: Secondary | ICD-10-CM | POA: Diagnosis not present

## 2022-05-29 DIAGNOSIS — S22019D Unspecified fracture of first thoracic vertebra, subsequent encounter for fracture with routine healing: Secondary | ICD-10-CM | POA: Diagnosis not present

## 2022-05-29 DIAGNOSIS — S22039D Unspecified fracture of third thoracic vertebra, subsequent encounter for fracture with routine healing: Secondary | ICD-10-CM | POA: Diagnosis not present

## 2022-06-03 DIAGNOSIS — H40013 Open angle with borderline findings, low risk, bilateral: Secondary | ICD-10-CM | POA: Diagnosis not present

## 2022-06-03 DIAGNOSIS — D3132 Benign neoplasm of left choroid: Secondary | ICD-10-CM | POA: Diagnosis not present

## 2022-06-03 DIAGNOSIS — H1045 Other chronic allergic conjunctivitis: Secondary | ICD-10-CM | POA: Diagnosis not present

## 2022-06-03 DIAGNOSIS — H31093 Other chorioretinal scars, bilateral: Secondary | ICD-10-CM | POA: Diagnosis not present

## 2022-06-03 DIAGNOSIS — E113551 Type 2 diabetes mellitus with stable proliferative diabetic retinopathy, right eye: Secondary | ICD-10-CM | POA: Diagnosis not present

## 2022-06-03 DIAGNOSIS — H04123 Dry eye syndrome of bilateral lacrimal glands: Secondary | ICD-10-CM | POA: Diagnosis not present

## 2022-06-03 DIAGNOSIS — Z961 Presence of intraocular lens: Secondary | ICD-10-CM | POA: Diagnosis not present

## 2022-06-07 ENCOUNTER — Other Ambulatory Visit: Payer: Self-pay | Admitting: Cardiovascular Disease

## 2022-06-17 ENCOUNTER — Ambulatory Visit: Payer: Medicare Other | Attending: Nurse Practitioner | Admitting: Nurse Practitioner

## 2022-06-17 ENCOUNTER — Encounter: Payer: Self-pay | Admitting: Nurse Practitioner

## 2022-06-17 VITALS — BP 132/62 | HR 66 | Ht 67.0 in | Wt 171.6 lb

## 2022-06-17 DIAGNOSIS — E782 Mixed hyperlipidemia: Secondary | ICD-10-CM | POA: Diagnosis not present

## 2022-06-17 DIAGNOSIS — E785 Hyperlipidemia, unspecified: Secondary | ICD-10-CM

## 2022-06-17 DIAGNOSIS — I739 Peripheral vascular disease, unspecified: Secondary | ICD-10-CM

## 2022-06-17 DIAGNOSIS — I25118 Atherosclerotic heart disease of native coronary artery with other forms of angina pectoris: Secondary | ICD-10-CM

## 2022-06-17 DIAGNOSIS — I10 Essential (primary) hypertension: Secondary | ICD-10-CM | POA: Insufficient documentation

## 2022-06-17 DIAGNOSIS — I25119 Atherosclerotic heart disease of native coronary artery with unspecified angina pectoris: Secondary | ICD-10-CM

## 2022-06-17 DIAGNOSIS — I6523 Occlusion and stenosis of bilateral carotid arteries: Secondary | ICD-10-CM | POA: Insufficient documentation

## 2022-06-17 DIAGNOSIS — I6529 Occlusion and stenosis of unspecified carotid artery: Secondary | ICD-10-CM

## 2022-06-17 MED ORDER — REPATHA SURECLICK 140 MG/ML ~~LOC~~ SOAJ: 140.0000 mg | SUBCUTANEOUS | 11 refills | Status: DC

## 2022-06-17 MED ORDER — LISINOPRIL 2.5 MG PO TABS: 2.5000 mg | ORAL_TABLET | Freq: Every day | ORAL | 3 refills | Status: DC

## 2022-06-17 MED ORDER — NITROGLYCERIN 0.4 MG SL SUBL: 0.4000 mg | SUBLINGUAL_TABLET | SUBLINGUAL | 3 refills | Status: AC | PRN

## 2022-06-17 NOTE — Progress Notes (Unsigned)
Cardiology Office Note:    Date:  06/18/2022   ID:  Taylor [name], DOB 04-30-1945, MRN [id number]  PCP:  [name], MD   [facility] Providers Cardiologist:  Kristeen Miss, MD Sleep Medicine:  Armanda Magic, MD     Referring MD: [name], MD   Chief Complaint: CAD  History of Present Illness:    Taylor [name] is a very pleasant 78 y.o. female with a hx of CAD s/p CABG, HTN, former tobacco abuse, PAD, history of PE, breast cancer, HLD, carotid artery disease, and diabetes.  She underwent CABG in 2008. She had unstable angina in 02/2015 with cath that revealed S-RCA and S-Dx/OM occluded. PCI/DES to RCA.  Repeat cath 11/2015 for [place] with mRCA stent, L-LAD ok, stable disease in LCx, pRCA 95% with PCI/DES to pRCA. Low risk nuclear stress test 12/2018.    PAD has been followed by Dr. Kirke Corin.  She has bilateral popliteal artery occlusions felt not to be ideal for intervention. History of pulmonary embolism 2014.  She has maintained consistent follow-up and was last seen in cardiology clinic on 05/12/21 by Dr. Elease Hashimoto.  She had been managing diet with reduction of simple carbohydrates and chocolate due to elevated triglycerides. She had previously increased dose of Imdur due to DOE but did not note any improvement.  She resumed Imdur 60 mg daily.  No changes were made to treatment regimen and she was advised to follow-up in 1 year.  She suffered a fall 04/24/22 after falling through a deck found to have right rib fracture, L1-L3 transverse process fracture, acute nondisplaced T11 vertebral body fracture treated non-surgically. Hospital course complicated by progressive shortness of breath with severe hypoxia due to right pneumothorax.  This was treated with chest tube placement. Admitted for conservative management and rehabilitation.   Today, she is here with her husband. Continuing to recover from musculoskeletal issues following fall. Wears a back brace with walking.  Lisinopril was discontinued due to hypotension during hospitalization.  Having OT and PT come to her home, they have noted some increased blood pressures at times.  She has completed therapy and plans to continue exercise on her own.  During hospitalization and recovery, she denies chest pain, shortness of breath, orthopnea, PND, palpitations, presyncope, syncope. Feels that angina has been stable on current therapy. Plans to reschedule colonoscopy son that was previously scheduled in April before she fell.   Past Medical History:  Diagnosis Date   Cancer of left breast (HCC)    Complication of anesthesia    Coronary artery disease    a. 2006 s/p CABG x 4 (LIMA->LAD, VG->Diag->OM, VG->RPDA);  b. 02/2015: [place] s/p DES to [facility], VG->PDA & VG->D1->OM 100; c. 06/2015 Cath: diffuse dzs->Med rx; d. 11/2015 Cath/PCI: LM nl, LAD 100ost, 95d, D1 90ost, LCX 35ost, 65p, OM2 40, lat OM2 90, RCA 95p (2.75x16 Synergy DES), patent mid stent, RPLB 70, LIMA->LAD ok.EF55-65%.   Depression    Hx of tobacco use, presenting hazards to health    a. quit 2006.   Hypercholesteremia    Hypertension    Hypertensive heart disease    Echo 04/2019: Inferior HK, EF 55, normal RV SF, RVSP normal at 29.2, mild BAE, trivial MR, mild aortic valve sclerosis without stenosis   Left carotid bruit    a. 09/2014 Carotid U/S: 1-39% bilat ICA stenosis.   PAD (peripheral artery disease) (HCC)    a. 11/2014 ABI: R: 0.63, L 0.59;  b. 11/2014 Periph Angio: bilat  pop occlusions. L - short w/ reconstituion via collats in dist pop w/ 2 vessel runoff, R long w/ reconstitution in prox tib/peroneal arteries-->med Rx w/ pletal.   Pulmonary embolism (HCC) a. 2014.   Reflux esophagitis    Type II diabetes mellitus (HCC)     Past Surgical History:  Procedure Laterality Date   BREAST BIOPSY Left ~ 2012   BUNIONECTOMY Left 1970s   CARDIAC CATHETERIZATION  04/2004   CARDIAC CATHETERIZATION N/A 02/21/2015   Procedure: Left Heart Cath and Cors/Grafts  Angiography;  Surgeon: Lyn Records, MD;  Location: [facility] INVASIVE CV LAB;  Service: Cardiovascular;  Laterality: N/A;   CARDIAC CATHETERIZATION N/A 02/21/2015   Procedure: Coronary Stent Intervention;  Surgeon: Lyn Records, MD;  Location: [facility] INVASIVE CV LAB;  Service: Cardiovascular;  Laterality: N/A;   CARDIAC CATHETERIZATION N/A 02/21/2015   Procedure: Intravascular Pressure Wire/FFR Study;  Surgeon: Lyn Records, MD;  Location[facility] INVASIVE CV LAB;  Service: Cardiovascular;  Laterality: N/A;   CARDIAC CATHETERIZATION N/A 06/20/2015   Procedure: Left Heart Cath and Cors/Grafts Angiography;  Surgeon: Laurey Morale, MD;  Location: [facility] INVASIVE CV LAB;  Service: Cardiovascular;  Laterality: N/A;   CARDIAC CATHETERIZATION N/A 11/19/2015   Procedure: Left Heart Cath and Cors/Grafts Angiography;  Surgeon: Peter M Swaziland, MD;  Location: [facility] INVASIVE CV LAB;  Service: Cardiovascular;  Laterality: N/A;   CARDIAC CATHETERIZATION N/A 11/19/2015   Procedure: Coronary Stent Intervention;  Surgeon: Peter M Swaziland, MD;  Location: [facility] INVASIVE CV LAB;  Service: Cardiovascular;  Laterality: N/A;   CATARACT EXTRACTION W/ INTRAOCULAR LENS  IMPLANT, BILATERAL Bilateral 1980s-1990s   CORONARY ANGIOPLASTY WITH STENT PLACEMENT  02/21/2015   "1 stent"   CORONARY ARTERY BYPASS GRAFT  05/2004   "CABG X4"   EYE SURGERY     LAPAROSCOPIC CHOLECYSTECTOMY  1990s   MASTECTOMY Left ~ 2012   PERIPHERAL VASCULAR CATHETERIZATION N/A 12/12/2014   Procedure: Abdominal Aortogram;  Surgeon: Iran Ouch, MD;  Location: [facility] INVASIVE CV LAB;  Service: Cardiovascular;  Laterality: N/A;   RETINAL DETACHMENT SURGERY Right 1990s    Current Medications: Current Meds  Medication Sig   acetaminophen (TYLENOL) 500 MG tablet Take 1,000 mg by mouth every 6 (six) hours as needed for headache (pain).    ALPRAZolam (XANAX) 0.25 MG tablet Take 0.25 mg by mouth daily as needed for anxiety.    aspirin 81 MG tablet Take 81 mg by mouth daily.    clopidogrel (PLAVIX) 75 MG tablet Take 1 tablet by mouth once daily   Evolocumab (REPATHA SURECLICK) 140 MG/ML SOAJ Inject 140 mg into the skin every 14 (fourteen) days.   glipiZIDE (GLUCOTROL) 5 MG tablet Take 10 mg by mouth daily.   isosorbide mononitrate (IMDUR) 60 MG 24 hr tablet Take 1 tablet by mouth once daily   levothyroxine (SYNTHROID) 75 MCG tablet Take 75 mcg by mouth daily before breakfast.   lisinopril (ZESTRIL) 2.5 MG tablet Take 1 tablet (2.5 mg total) by mouth daily.   rosuvastatin (CRESTOR) 20 MG tablet TAKE ONE TABLET BY MOUTH ONCE DAILY AT  6PM (Patient taking differently: Take 20 mg by mouth daily.)   sertraline (ZOLOFT) 100 MG tablet Take 100 mg by mouth daily.    timolol (BETIMOL) 0.25 % ophthalmic solution 1-2 drops 2 (two) times daily.   Vitamin D, Ergocalciferol, (DRISDOL) 1.25 MG (50000 UNIT) CAPS capsule Take 1 capsule (50,000 Units total) by mouth every 7 (seven) days.   [DISCONTINUED] nitroGLYCERIN (NITROSTAT) 0.4 MG SL  tablet Place 1 tablet (0.4 mg total) under the tongue every 5 (five) minutes as needed for chest pain.     Allergies:   Niacin, Duloxetine, Amoxicillin, Ibuprofen, and Lipitor [atorvastatin]   Social History   Socioeconomic History   Marital status: Married    Spouse name: Not on file   Number of children: Not on file   Years of education: Not on file   Highest education level: Not on file  Occupational History   Not on file  Tobacco Use   Smoking status: Former    Packs/day: 0.50    Years: 15.00    Additional pack years: 0.00    Total pack years: 7.50    Types: Cigarettes    Quit date: 05/11/2004    Years since quitting: 18.1   Smokeless tobacco: Never  Vaping Use   Vaping Use: Never used  Substance and Sexual Activity   Alcohol use: No   Drug use: No   Sexual activity: Not Currently  Other Topics Concern   Not on file  Social History Narrative   Lives in [place] with her husband.   Social Determinants of Health    Financial Resource Strain: Not on file  Food Insecurity: No Food Insecurity (04/24/2022)   Hunger Vital Sign    Worried About Running Out of Food in the Last Year: Never true    Ran Out of Food in the Last Year: Never true  Transportation Needs: No Transportation Needs (04/24/2022)   PRAPARE - Administrator, Civil Service (Medical): No    Lack of Transportation (Non-Medical): No  Physical Activity: Not on file  Stress: Not on file  Social Connections: Not on file     Family History: The patient's family history includes Cancer in her sister; Coronary artery disease (age of onset: 61) in her sister; Coronary artery disease (age of onset: 17) in her mother; Deep vein thrombosis in her cousin; Heart attack (age of onset: 34) in her father.  ROS:   Please see the history of present illness.   All other systems reviewed and are negative.  Labs/Other Studies Reviewed:    The following studies were reviewed today:  Echocardiogram 05/10/2019  Septal and basal HK, EF 55, normal diastolic function, normal RV SF, mild BAE, trivial MR, RVSP 29.2 mmHg   Myoview 01/04/2019 EF 53, normal perfusion, low risk   Carotid US 10/10/2018 bilat ICA 1-39   ABIs 05/27/17 R 0.70; L 0.68 Mod Bilat LE arterial dz    LHC 11/19/15 LM normal LAD ostial 100, distal 95, D1 ostial 90 LCx ostial 35, proximal-mid 65, OM2 40, lateral OM2 90 RCA proximal 95, mid stent patent RPDA filled by collaterals from second septal perforator RPLB1 70 LIMA-LAD normal EF 55-65 PCI: 2.75 x 16 mm Synergy DES to the proximal RCA   Carotid US 9/17 RICA 40-59 >> FU 1 year   Echo 8/14 Mild LVH, EF 60, normal wall motion, trivial MR, mild LAE, normal RVSF  Recent Labs: 05/02/2022: ALT 60; Hemoglobin 9.2; Platelets 341 05/06/2022: BUN 27; Creatinine, Ser 1.04; Magnesium 2.3; Potassium 4.7; Sodium 132  Recent Lipid Panel    Component Value Date/Time   CHOL 86 (L) 01/27/2016 1034   TRIG 134 01/27/2016 1034    HDL 29 (L) 01/27/2016 1034   CHOLHDL 3.0 01/27/2016 1034   CHOLHDL 3.6 09/06/2015 0957   VLDL 20 09/06/2015 0957   LDLCALC 30 01/27/2016 1034     Risk Assessment/Calculations:  Physical Exam:    VS:  BP 132/62   Pulse 66   Ht 5\' 7"  (1.702 m)   Wt 171 lb 9.6 oz (77.8 kg)   SpO2 97%   BMI 26.88 kg/m     Wt Readings from Last 3 Encounters:  06/17/22 171 lb 9.6 oz (77.8 kg)  05/01/22 190 lb 4.1 oz (86.3 kg)  04/26/22 176 lb 5.9 oz (80 kg)     GEN:  Well nourished, well developed in no acute distress HEENT: Normal NECK: No JVD; No carotid bruits CARDIAC: RRR, no murmurs, rubs, gallops RESPIRATORY:  Clear to auscultation without rales, wheezing or rhonchi  ABDOMEN: Soft, non-tender, non-distended MUSCULOSKELETAL:  No edema; No deformity. 2+ pedal pulses, equal bilaterally SKIN: Warm and dry NEUROLOGIC:  Alert and oriented x 3 PSYCHIATRIC:  Normal affect   EKG:  EKG is not ordered today.       Diagnoses:    1. Coronary artery disease of native artery of native heart with stable angina pectoris (HCC)   2. Bilateral carotid artery stenosis   3. Essential hypertension   4. Mixed hyperlipidemia   5. PAD (peripheral artery disease) (HCC)    Assessment and Plan:     Preoperative cardiac evaluation: Plans to have colonoscopy soon. According to the Revised Cardiac Risk Index (RCRI), her Perioperative Risk of Major Cardiac Event is (%): 0.9. Her Functional Capacity in METs is: 5.07 according to the Duke Activity Status Index (DASI). Per office protocol, he may hold Plavix for 5 days prior to procedure and should resume as soon as hemodynamically stable postoperatively. Ideally aspirin should be continued without interruption, however if the bleeding risk is too great, aspirin may be held for 5-7 days prior to surgery. Please resume aspirin post operatively when it is felt to be safe from a bleeding standpoint.    CAD with stable angina: History of CABG in 2006.   Vein graft to RCA and vein graft to diagonal/OM known to be occluded.  DES to RCA 02/2015 and DES to RCA 11/2015.  Moderate disease in native LCx which was negative by FFR, LIMA to LAD has been patent.  Myoview December 2020 was low risk. She is recovering from traumatic fall in April. No concerns with [place] during her hospitalization and recovery. She denies chest pain, dyspnea, or other symptoms concerning for angina.  No indication for further ischemic evaluation at this time. She may hold DAPT as indicated above for low risk procedure. No bleeding concerns.  Continue aspirin, clopidogrel, lisinopril, Imdur, rosuvastatin.  Chronic DOE: Stable. No dyspnea, orthopnea, PND, edema to report.   Hyperlipidemia LDL goal < 55: LDL 83, trigs 419 on 04/17/22. Discussed the importance of reducing these numbers in order to reduce risk of further CAD/PAD.  She is willing to try PCSK9i.  Explained that prior authorization will likely be needed. Will plan to start Repatha 140 mg every 2 weeks unless cost prohibitive. Plan for repeat lipid/ALT 2-3 months after initiation of therapy.   Carotid artery disease: Asks about history of carotid artery disease on previous test. She had mild carotid stenosis bilaterally on duplex 09/2018. Is asymptomatic. She would like to have this rechecked.   PAD: History of bilateral popliteal artery occlusions felt not to be ideal for intervention by [facility] cardiologist. She denies claudication. Recommended continued walking for exercise.   Hypertension: BP is mildly elevated at 144/78. PT noting elevated BPs at home. We will go ahead and restart lisinopril but at lowest dose 2.5 mg  daily. Advised her to monitor and notify us if BP consistently > 140 or > 90.     Disposition: 6 months with me  Medication Adjustments/Labs and Tests Ordered: Current medicines are reviewed at length with the patient today.  Concerns regarding medicines are outlined above.  Orders Placed This Encounter   Procedures   VAS US CAROTID   Meds ordered this encounter  Medications   lisinopril (ZESTRIL) 2.5 MG tablet    Sig: Take 1 tablet (2.5 mg total) by mouth daily.    Dispense:  90 tablet    Refill:  3    Order Specific Question:   Supervising Provider    Answer:   Elease Hashimoto, PHILIP J [[id number]]   Evolocumab (REPATHA SURECLICK) 140 MG/ML SOAJ    Sig: Inject 140 mg into the skin every 14 (fourteen) days.    Dispense:  2 mL    Refill:  11    Order Specific Question:   Supervising Provider    Answer:   Vesta Mixer [[id number]]   nitroGLYCERIN (NITROSTAT) 0.4 MG SL tablet    Sig: Place 1 tablet (0.4 mg total) under the tongue every 5 (five) minutes as needed for chest pain.    Dispense:  25 tablet    Refill:  3    Patient Instructions  Medication Instructions:   START Repatha (140 mg) into skin every two weeks.  START lisinopril one (1) tablet by mouth ( 2.5 mg) daily.    *If you need a refill on your cardiac medications before your next appointment, please call your pharmacy*   Lab Work:  None ordered.  If you have labs (blood work) drawn today and your tests are completely normal, you will receive your results only by: MyChart Message (if you have MyChart) OR A paper copy in the mail If you have any lab test that is abnormal or we need to change your treatment, we will call you to review the results.   Testing/Procedures:  Your physician has requested that you have a carotid duplex. This test is an ultrasound of the carotid arteries in your neck. It looks at blood flow through these arteries that supply the brain with blood. Allow one hour for this exam. There are no restrictions or special instructions.    Follow-Up: At [facility], you and your health needs are our priority.  As part of our continuing mission to provide you with exceptional heart care, we have created designated Provider Care Teams.  These Care Teams include your primary Cardiologist (physician)  and Advanced Practice Providers (APPs -  Physician Assistants and Nurse Practitioners) who all work together to provide you with the care you need, when you need it.  We recommend signing up for the patient portal called "MyChart".  Sign up information is provided on this After Visit Summary.  MyChart is used to connect with patients for Virtual Visits (Telemedicine).  Patients are able to view lab/test results, encounter notes, upcoming appointments, etc.  Non-urgent messages can be sent to your provider as well.   To learn more about what you can do with MyChart, go to [email or web address].    Your next appointment:   6 month(s)  Provider:   Eligha Bridegroom, NP         Other Instructions  I will call you in two months or send mychart message with appointment in December with Lebron Conners.    Signed, Levi Aland, NP  06/18/2022 7:32 AM    [facility]

## 2022-06-17 NOTE — Patient Instructions (Addendum)
Medication Instructions:   START Repatha (140 mg) into skin every two weeks.  START lisinopril one (1) tablet by mouth ( 2.5 mg) daily.    *If you need a refill on your cardiac medications before your next appointment, please call your pharmacy*   Lab Work:  None ordered.  If you have labs (blood work) drawn today and your tests are completely normal, you will receive your results only by: MyChart Message (if you have MyChart) OR A paper copy in the mail If you have any lab test that is abnormal or we need to change your treatment, we will call you to review the results.   Testing/Procedures:  Your physician has requested that you have a carotid duplex. This test is an ultrasound of the carotid arteries in your neck. It looks at blood flow through these arteries that supply the brain with blood. Allow one hour for this exam. There are no restrictions or special instructions.    Follow-Up: At [facility], you and your health needs are our priority.  As part of our continuing mission to provide you with exceptional heart care, we have created designated Provider Care Teams.  These Care Teams include your primary Cardiologist (physician) and Advanced Practice Providers (APPs -  Physician Assistants and Nurse Practitioners) who all work together to provide you with the care you need, when you need it.  We recommend signing up for the patient portal called "MyChart".  Sign up information is provided on this After Visit Summary.  MyChart is used to connect with patients for Virtual Visits (Telemedicine).  Patients are able to view lab/test results, encounter notes, upcoming appointments, etc.  Non-urgent messages can be sent to your provider as well.   To learn more about what you can do with MyChart, go to [email or web address].    Your next appointment:   6 month(s)  Provider:   Eligha Bridegroom, NP         Other Instructions  I will call you in two months or send  mychart message with appointment in December with Lebron Conners.

## 2022-06-18 ENCOUNTER — Other Ambulatory Visit (HOSPITAL_COMMUNITY): Payer: Self-pay

## 2022-06-18 ENCOUNTER — Telehealth: Payer: Self-pay | Admitting: Pharmacist Clinician (PhC)/ Clinical Pharmacy Specialist

## 2022-06-18 ENCOUNTER — Telehealth: Payer: Self-pay

## 2022-06-18 ENCOUNTER — Other Ambulatory Visit (HOSPITAL_COMMUNITY): Payer: Self-pay | Admitting: Student

## 2022-06-18 ENCOUNTER — Encounter: Payer: Self-pay | Admitting: Nurse Practitioner

## 2022-06-18 DIAGNOSIS — S22088A Other fracture of T11-T12 vertebra, initial encounter for closed fracture: Secondary | ICD-10-CM

## 2022-06-18 NOTE — Telephone Encounter (Signed)
Pharmacy Patient Advocate Encounter   Received notification from [facility]  that prior authorization for REPATHA 140MG .ML is required/requested.   PA submitted to [facility] via CoverMyMeds Key or (Medicaid) confirmation # [id number]   Status is pending

## 2022-06-18 NOTE — Telephone Encounter (Signed)
Pharmacy Patient Advocate Encounter  Prior Authorization for REPATHA 140MG /ML has been APPROVED by [facility] from 1.1.24 to 12.31.24.

## 2022-06-18 NOTE — Telephone Encounter (Signed)
Needs renewal of Repatha PA please.  Updated labs in [facility]

## 2022-06-25 DIAGNOSIS — E7800 Pure hypercholesterolemia, unspecified: Secondary | ICD-10-CM | POA: Diagnosis not present

## 2022-06-25 DIAGNOSIS — E1142 Type 2 diabetes mellitus with diabetic polyneuropathy: Secondary | ICD-10-CM | POA: Diagnosis not present

## 2022-06-25 DIAGNOSIS — F411 Generalized anxiety disorder: Secondary | ICD-10-CM | POA: Diagnosis not present

## 2022-06-25 DIAGNOSIS — R21 Rash and other nonspecific skin eruption: Secondary | ICD-10-CM | POA: Diagnosis not present

## 2022-06-25 DIAGNOSIS — Z6826 Body mass index (BMI) 26.0-26.9, adult: Secondary | ICD-10-CM | POA: Diagnosis not present

## 2022-07-06 DIAGNOSIS — R911 Solitary pulmonary nodule: Secondary | ICD-10-CM | POA: Diagnosis not present

## 2022-07-06 DIAGNOSIS — C4361 Malignant melanoma of right upper limb, including shoulder: Secondary | ICD-10-CM | POA: Diagnosis not present

## 2022-07-06 DIAGNOSIS — Z7982 Long term (current) use of aspirin: Secondary | ICD-10-CM | POA: Diagnosis not present

## 2022-07-06 DIAGNOSIS — C779 Secondary and unspecified malignant neoplasm of lymph node, unspecified: Secondary | ICD-10-CM | POA: Diagnosis not present

## 2022-07-06 DIAGNOSIS — I2510 Atherosclerotic heart disease of native coronary artery without angina pectoris: Secondary | ICD-10-CM | POA: Diagnosis not present

## 2022-07-06 DIAGNOSIS — E1151 Type 2 diabetes mellitus with diabetic peripheral angiopathy without gangrene: Secondary | ICD-10-CM | POA: Diagnosis not present

## 2022-07-06 DIAGNOSIS — I10 Essential (primary) hypertension: Secondary | ICD-10-CM | POA: Diagnosis not present

## 2022-07-06 DIAGNOSIS — Z79899 Other long term (current) drug therapy: Secondary | ICD-10-CM | POA: Diagnosis not present

## 2022-07-06 DIAGNOSIS — E039 Hypothyroidism, unspecified: Secondary | ICD-10-CM | POA: Diagnosis not present

## 2022-07-06 DIAGNOSIS — E785 Hyperlipidemia, unspecified: Secondary | ICD-10-CM | POA: Diagnosis not present

## 2022-07-06 DIAGNOSIS — Z7902 Long term (current) use of antithrombotics/antiplatelets: Secondary | ICD-10-CM | POA: Diagnosis not present

## 2022-07-06 DIAGNOSIS — Z951 Presence of aortocoronary bypass graft: Secondary | ICD-10-CM | POA: Diagnosis not present

## 2022-07-12 ENCOUNTER — Other Ambulatory Visit: Payer: Self-pay | Admitting: Cardiovascular Disease

## 2022-07-17 ENCOUNTER — Ambulatory Visit (HOSPITAL_COMMUNITY)
Admission: RE | Admit: 2022-07-17 | Discharge: 2022-07-17 | Disposition: A | Discharge status: Home / Self Care | Payer: Medicare Other | Source: Ambulatory Visit | Attending: Student | Admitting: Student

## 2022-07-17 DIAGNOSIS — S22088A Other fracture of T11-T12 vertebra, initial encounter for closed fracture: Secondary | ICD-10-CM | POA: Diagnosis not present

## 2022-07-17 DIAGNOSIS — M47814 Spondylosis without myelopathy or radiculopathy, thoracic region: Secondary | ICD-10-CM | POA: Diagnosis not present

## 2022-07-17 DIAGNOSIS — S22089A Unspecified fracture of T11-T12 vertebra, initial encounter for closed fracture: Secondary | ICD-10-CM | POA: Diagnosis not present

## 2022-07-29 ENCOUNTER — Ambulatory Visit (HOSPITAL_COMMUNITY)
Admission: RE | Admit: 2022-07-29 | Discharge: 2022-07-29 | Disposition: A | Discharge status: Home / Self Care | Payer: Medicare Other | Source: Ambulatory Visit | Attending: Cardiology | Admitting: Cardiology

## 2022-07-29 DIAGNOSIS — I6523 Occlusion and stenosis of bilateral carotid arteries: Secondary | ICD-10-CM | POA: Diagnosis present

## 2022-07-29 DIAGNOSIS — I25118 Atherosclerotic heart disease of native coronary artery with other forms of angina pectoris: Secondary | ICD-10-CM

## 2022-07-29 DIAGNOSIS — E782 Mixed hyperlipidemia: Secondary | ICD-10-CM

## 2022-07-29 DIAGNOSIS — I10 Essential (primary) hypertension: Secondary | ICD-10-CM | POA: Diagnosis not present

## 2022-07-30 ENCOUNTER — Encounter (INDEPENDENT_AMBULATORY_CARE_PROVIDER_SITE_OTHER): Payer: Medicare Other | Admitting: Ophthalmology

## 2022-07-30 DIAGNOSIS — I10 Essential (primary) hypertension: Secondary | ICD-10-CM

## 2022-07-30 DIAGNOSIS — H338 Other retinal detachments: Secondary | ICD-10-CM

## 2022-07-30 DIAGNOSIS — H35033 Hypertensive retinopathy, bilateral: Secondary | ICD-10-CM

## 2022-07-30 DIAGNOSIS — D3132 Benign neoplasm of left choroid: Secondary | ICD-10-CM | POA: Diagnosis not present

## 2022-07-30 DIAGNOSIS — E113591 Type 2 diabetes mellitus with proliferative diabetic retinopathy without macular edema, right eye: Secondary | ICD-10-CM | POA: Diagnosis not present

## 2022-07-30 DIAGNOSIS — E113292 Type 2 diabetes mellitus with mild nonproliferative diabetic retinopathy without macular edema, left eye: Secondary | ICD-10-CM

## 2022-07-30 DIAGNOSIS — H43813 Vitreous degeneration, bilateral: Secondary | ICD-10-CM

## 2022-07-30 DIAGNOSIS — Z7984 Long term (current) use of oral hypoglycemic drugs: Secondary | ICD-10-CM | POA: Diagnosis not present

## 2022-08-03 NOTE — Progress Notes (Signed)
Pt has been made aware of normal result and verbalized understanding.  jw

## 2022-08-04 DIAGNOSIS — S22088A Other fracture of T11-T12 vertebra, initial encounter for closed fracture: Secondary | ICD-10-CM | POA: Diagnosis not present

## 2022-08-04 DIAGNOSIS — Z6827 Body mass index (BMI) 27.0-27.9, adult: Secondary | ICD-10-CM | POA: Diagnosis not present

## 2022-08-07 DIAGNOSIS — R410 Disorientation, unspecified: Secondary | ICD-10-CM | POA: Diagnosis not present

## 2022-08-07 DIAGNOSIS — Z6827 Body mass index (BMI) 27.0-27.9, adult: Secondary | ICD-10-CM | POA: Diagnosis not present

## 2022-08-07 DIAGNOSIS — E7800 Pure hypercholesterolemia, unspecified: Secondary | ICD-10-CM | POA: Diagnosis not present

## 2022-08-07 DIAGNOSIS — R109 Unspecified abdominal pain: Secondary | ICD-10-CM | POA: Diagnosis not present

## 2022-08-07 DIAGNOSIS — F419 Anxiety disorder, unspecified: Secondary | ICD-10-CM | POA: Diagnosis not present

## 2022-09-24 DIAGNOSIS — R911 Solitary pulmonary nodule: Secondary | ICD-10-CM | POA: Diagnosis not present

## 2022-09-24 DIAGNOSIS — B351 Tinea unguium: Secondary | ICD-10-CM | POA: Diagnosis not present

## 2022-09-24 DIAGNOSIS — I2510 Atherosclerotic heart disease of native coronary artery without angina pectoris: Secondary | ICD-10-CM | POA: Diagnosis not present

## 2022-09-24 DIAGNOSIS — C4359 Malignant melanoma of other part of trunk: Secondary | ICD-10-CM | POA: Diagnosis not present

## 2022-09-24 DIAGNOSIS — M79672 Pain in left foot: Secondary | ICD-10-CM | POA: Diagnosis not present

## 2022-09-24 DIAGNOSIS — C773 Secondary and unspecified malignant neoplasm of axilla and upper limb lymph nodes: Secondary | ICD-10-CM | POA: Diagnosis not present

## 2022-09-24 DIAGNOSIS — J939 Pneumothorax, unspecified: Secondary | ICD-10-CM | POA: Diagnosis not present

## 2022-09-24 DIAGNOSIS — C4361 Malignant melanoma of right upper limb, including shoulder: Secondary | ICD-10-CM | POA: Diagnosis not present

## 2022-09-24 DIAGNOSIS — Z880 Allergy status to penicillin: Secondary | ICD-10-CM | POA: Diagnosis not present

## 2022-09-24 DIAGNOSIS — R609 Edema, unspecified: Secondary | ICD-10-CM | POA: Diagnosis not present

## 2022-09-24 DIAGNOSIS — Z79890 Hormone replacement therapy (postmenopausal): Secondary | ICD-10-CM | POA: Diagnosis not present

## 2022-09-24 DIAGNOSIS — G4733 Obstructive sleep apnea (adult) (pediatric): Secondary | ICD-10-CM | POA: Diagnosis not present

## 2022-09-24 DIAGNOSIS — Z7984 Long term (current) use of oral hypoglycemic drugs: Secondary | ICD-10-CM | POA: Diagnosis not present

## 2022-09-24 DIAGNOSIS — E039 Hypothyroidism, unspecified: Secondary | ICD-10-CM | POA: Diagnosis not present

## 2022-09-24 DIAGNOSIS — I10 Essential (primary) hypertension: Secondary | ICD-10-CM | POA: Diagnosis not present

## 2022-09-24 DIAGNOSIS — M79671 Pain in right foot: Secondary | ICD-10-CM | POA: Diagnosis not present

## 2022-09-24 DIAGNOSIS — E1140 Type 2 diabetes mellitus with diabetic neuropathy, unspecified: Secondary | ICD-10-CM | POA: Diagnosis not present

## 2022-09-24 DIAGNOSIS — Z79899 Other long term (current) drug therapy: Secondary | ICD-10-CM | POA: Diagnosis not present

## 2022-09-24 DIAGNOSIS — E782 Mixed hyperlipidemia: Secondary | ICD-10-CM | POA: Diagnosis not present

## 2022-09-24 DIAGNOSIS — I8392 Asymptomatic varicose veins of left lower extremity: Secondary | ICD-10-CM | POA: Diagnosis not present

## 2022-09-24 DIAGNOSIS — K209 Esophagitis, unspecified without bleeding: Secondary | ICD-10-CM | POA: Diagnosis not present

## 2022-10-09 DIAGNOSIS — E1142 Type 2 diabetes mellitus with diabetic polyneuropathy: Secondary | ICD-10-CM | POA: Diagnosis not present

## 2022-10-09 DIAGNOSIS — E7800 Pure hypercholesterolemia, unspecified: Secondary | ICD-10-CM | POA: Diagnosis not present

## 2022-10-14 DIAGNOSIS — E1142 Type 2 diabetes mellitus with diabetic polyneuropathy: Secondary | ICD-10-CM | POA: Diagnosis not present

## 2022-10-14 DIAGNOSIS — Z6827 Body mass index (BMI) 27.0-27.9, adult: Secondary | ICD-10-CM | POA: Diagnosis not present

## 2022-10-14 DIAGNOSIS — Z23 Encounter for immunization: Secondary | ICD-10-CM | POA: Diagnosis not present

## 2022-10-14 DIAGNOSIS — I739 Peripheral vascular disease, unspecified: Secondary | ICD-10-CM | POA: Diagnosis not present

## 2022-10-14 DIAGNOSIS — F419 Anxiety disorder, unspecified: Secondary | ICD-10-CM | POA: Diagnosis not present

## 2022-10-14 DIAGNOSIS — R2689 Other abnormalities of gait and mobility: Secondary | ICD-10-CM | POA: Diagnosis not present

## 2022-12-22 ENCOUNTER — Ambulatory Visit: Payer: Medicare Other | Admitting: Nurse Practitioner

## 2022-12-28 DIAGNOSIS — Z951 Presence of aortocoronary bypass graft: Secondary | ICD-10-CM | POA: Diagnosis not present

## 2022-12-28 DIAGNOSIS — Z7902 Long term (current) use of antithrombotics/antiplatelets: Secondary | ICD-10-CM | POA: Diagnosis not present

## 2022-12-28 DIAGNOSIS — Z7982 Long term (current) use of aspirin: Secondary | ICD-10-CM | POA: Diagnosis not present

## 2022-12-28 DIAGNOSIS — E039 Hypothyroidism, unspecified: Secondary | ICD-10-CM | POA: Diagnosis not present

## 2022-12-28 DIAGNOSIS — C4361 Malignant melanoma of right upper limb, including shoulder: Secondary | ICD-10-CM | POA: Diagnosis not present

## 2022-12-28 DIAGNOSIS — E119 Type 2 diabetes mellitus without complications: Secondary | ICD-10-CM | POA: Diagnosis not present

## 2022-12-28 DIAGNOSIS — F419 Anxiety disorder, unspecified: Secondary | ICD-10-CM | POA: Diagnosis not present

## 2022-12-28 DIAGNOSIS — I2510 Atherosclerotic heart disease of native coronary artery without angina pectoris: Secondary | ICD-10-CM | POA: Diagnosis not present

## 2022-12-28 DIAGNOSIS — R531 Weakness: Secondary | ICD-10-CM | POA: Diagnosis not present

## 2022-12-28 DIAGNOSIS — F32A Depression, unspecified: Secondary | ICD-10-CM | POA: Diagnosis not present

## 2022-12-28 DIAGNOSIS — Z79899 Other long term (current) drug therapy: Secondary | ICD-10-CM | POA: Diagnosis not present

## 2022-12-28 DIAGNOSIS — E785 Hyperlipidemia, unspecified: Secondary | ICD-10-CM | POA: Diagnosis not present

## 2022-12-28 DIAGNOSIS — I10 Essential (primary) hypertension: Secondary | ICD-10-CM | POA: Diagnosis not present

## 2022-12-28 DIAGNOSIS — Z87891 Personal history of nicotine dependence: Secondary | ICD-10-CM | POA: Diagnosis not present

## 2022-12-28 NOTE — Progress Notes (Unsigned)
Cardiology Office Note:  .   Date:  12/31/2022  ID:  Lenore Manner, DOB 08/04/1945, MRN [id number] PCP: [name], MD  [facility] Providers Cardiologist:  Kristeen Miss, MD Sleep Medicine:  Armanda Magic, MD    Patient Profile: .      PMH Coronary artery disease S/p CABG in 2008 [place] in 02/2015 >>LHC S-RCA and S-Dx/OM occluded, patent LIMA-LAD Severe native RCA disease with PCI/DES to RCA [place] in 11/2015 >>LHC Culprit lesion mRCA treated with DES (overlapping with prior stent) No change from prior angiogram Low risk nuclear stress test 12/2018 Hypertension Former tobacco abuse PAD Bilateral popliteal artery occlusions felt not to be ideal for intervention Pulmonary embolus 2014 Breast cancer Hyperlipidemia Carotid artery disease Carotid duplex 07/30/2022 1-39% bilateral stenosis Type 2 DM  History of CABG in 2008.  She had unstable angina in February 2017 with cath that revealed culprit lesion in RCA treated successfully with PCI/DES.  Repeat cath June 2017, medical therapy recommended.  Unstable angina 12/02/2015 with culprit lesion mRCA treated successfully with stent overlapping previous stent.  She had a low risk nuclear stress test 12/2018.   PAD has been followed by Dr. Kirke Corin. She has bilateral popliteal artery occlusion felt not to be ideal for intervention.  She suffered a fall 04/24/2022 after falling through a deck and was found to have right rib fracture, L1-L3 transverse process fracture, acute nondisplaced T11 vertebral body fracture treated nonsurgically.  Hospital course complicated by progressive shortness of breath with severe hypoxia due to right pneumothorax, treated with chest tube placement.  She was admitted for conservative management and rehabilitation.  Last cardiology clinic visit was 06/17/2022 with me.  She was continuing to recover from musculoskeletal issues following her fall.  Was wearing a back brace when walking.  Lisinopril was  discontinued due to hypotension during the hospitalization.  She was having OT and PT come to her home and they had noted her BP was elevated at times.  She was feeling the angina was stable on her current therapy.  She was planning to undergo colonoscopy soon and was felt that she could proceed without further testing.  Her aspirin and clopidogrel were held for the procedure.  Lipid panel 04/17/2022 revealed LDL 83 and triglycerides 419.  She was advised to start Repatha 140 mg every 14 days in addition to rosuvastatin 20 mg daily.  We also resumed low-dose lisinopril 2.5 mg daily for mildly elevated BP.       History of Present Illness: .   Taylor [name] is a very pleasant 77 y.o. female who is here today for 6 month follow-up.    Discussed the use of AI scribe software for clinical note transcription with the patient, who gave verbal consent to proceed.   ROS: See HPI       Studies Reviewed: [name]   EKG Interpretation Date/Time:  Thursday December 31 2022 14:34:49 EST Ventricular Rate:  56 PR Interval:  162 QRS Duration:  96 QT Interval:  452 QTC Calculation: 436 R Axis:   -53  Text Interpretation: Sinus bradycardia Incomplete right bundle branch block Left anterior fascicular block When compared with ECG of 27-Apr-2022 18:36, Left anterior fascicular block is now Present Nonspecific T wave abnormality, improved in Anterior leads Confirmed by Eligha Bridegroom [phone]) on 12/31/2022 2:48:08 PM     Risk Assessment/Calculations:             Physical Exam:   VS:  BP 112/62   Pulse  62   Ht 5\' 7"  (1.702 m)   Wt 183 lb 12.8 oz (83.4 kg)   SpO2 95%   BMI 28.79 kg/m    Wt Readings from Last 3 Encounters:  12/31/22 183 lb 12.8 oz (83.4 kg)  06/17/22 171 lb 9.6 oz (77.8 kg)  05/01/22 190 lb 4.1 oz (86.3 kg)    GEN: Well nourished, well developed in no acute distress NECK: No JVD; No carotid bruits CARDIAC: Irregular RR, no murmurs, rubs, gallops RESPIRATORY:  Clear to auscultation  without rales, wheezing or rhonchi  ABDOMEN: Soft, non-tender, non-distended EXTREMITIES:  No edema; No deformity     ASSESSMENT AND PLAN: .    CAD: History of CABG in 2008 with most recent cath 11/2015 that revealed patent LIMA-LAD, DES to mid RCA (overlapping with prior stent), otherwise stable disease. Occasional chest tightness when resting at night.  No increased chest discomfort with exertion.  She is gradually resuming exercise as she was limited due to low skeletal injuries from fall last year. She has not used nitroglycerin. Advised her of appropriate use of SL NTG. No bleeding concerns. We will continue GDMT with Imdur, lisinopril, aspirin, Plavix, and rosuvastatin.  I have asked her to notify us if symptoms worsen.   PAD: Known bilateral popliteal artery occlusion not ideal for intervention.  Mild bilateral carotid artery stenosis. Seen by Dr. Kirke Corin, [facility] cardiologist and tried cilostazol with no significant improvement. Bilateral claudication with walking long distances. This has not worsened recently. LDL is well controlled. Continue Plavix, asa, rosuvastatin.   Hypertension: BP is well controlled.   Bundle branch block: Irregular HR on exam.  EKG revealed sinus bradycardia at 56 bpm, incomplete RBBB, LAFB and nonspecific T wave abnormality. She is asymptomatic.  We discussed these findings in detail and questions were answered to her satisfaction including symptoms to monitor. No indication for further testing at this time.   Hyperlipidemia LDL goal < 55: LDL 39 on 10/09/22.  She did not start Repatha which was prescribed at last office visit. No specific dietary changes but is trying to exercise more frequently.  She tries to avoid fried foods and foods high in saturated fat. Focus on heart healthy mostly plant based diet avoiding saturated fat, processed foods, simple carbohydrates, and sugar along with aiming for at least 150 minutes of moderate intensity exercise each week. Continue  rosuvastatin.         Dispo: 6 months with Dr. Elease Hashimoto  Signed, Eligha Bridegroom, NP-C

## 2022-12-31 ENCOUNTER — Encounter: Payer: Self-pay | Admitting: Nurse Practitioner

## 2022-12-31 ENCOUNTER — Ambulatory Visit: Payer: Medicare Other | Attending: Nurse Practitioner | Admitting: Nurse Practitioner

## 2022-12-31 VITALS — BP 112/62 | HR 62 | Ht 67.0 in | Wt 183.8 lb

## 2022-12-31 DIAGNOSIS — E785 Hyperlipidemia, unspecified: Secondary | ICD-10-CM | POA: Diagnosis not present

## 2022-12-31 DIAGNOSIS — I10 Essential (primary) hypertension: Secondary | ICD-10-CM | POA: Insufficient documentation

## 2022-12-31 DIAGNOSIS — I454 Nonspecific intraventricular block: Secondary | ICD-10-CM | POA: Insufficient documentation

## 2022-12-31 DIAGNOSIS — I739 Peripheral vascular disease, unspecified: Secondary | ICD-10-CM | POA: Diagnosis not present

## 2022-12-31 DIAGNOSIS — I25118 Atherosclerotic heart disease of native coronary artery with other forms of angina pectoris: Secondary | ICD-10-CM | POA: Diagnosis not present

## 2022-12-31 NOTE — Patient Instructions (Signed)
Medication Instructions:   Your physician recommends that you continue on your current medications as directed. Please refer to the Current Medication list given to you today.   *If you need a refill on your cardiac medications before your next appointment, please call your pharmacy*   Lab Work:  None ordered.  If you have labs (blood work) drawn today and your tests are completely normal, you will receive your results only by: MyChart Message (if you have MyChart) OR A paper copy in the mail If you have any lab test that is abnormal or we need to change your treatment, we will call you to review the results.   Testing/Procedures:  None ordered.   Follow-Up: At [facility], you and your health needs are our priority.  As part of our continuing mission to provide you with exceptional heart care, we have created designated Provider Care Teams.  These Care Teams include your primary Cardiologist (physician) and Advanced Practice Providers (APPs -  Physician Assistants and Nurse Practitioners) who all work together to provide you with the care you need, when you need it.  We recommend signing up for the patient portal called "MyChart".  Sign up information is provided on this After Visit Summary.  MyChart is used to connect with patients for Virtual Visits (Telemedicine).  Patients are able to view lab/test results, encounter notes, upcoming appointments, etc.  Non-urgent messages can be sent to your provider as well.   To learn more about what you can do with MyChart, go to [email or web address].    Your next appointment:   6 month(s)  Provider:   Philip Nahser, MD      

## 2023-01-27 ENCOUNTER — Telehealth: Payer: Self-pay | Admitting: Cardiovascular Disease

## 2023-01-27 NOTE — Telephone Encounter (Signed)
   Pre-operative Risk Assessment    Patient Name: Taylor [name]  DOB: [birth year] MRN: [id number]   Date of last office visit: 12/31/2022 Date of next office visit: 06/29/2023   Request for Surgical Clearance    Procedure:   colonoscopy  Date of Surgery:  Clearance 03/02/23                                Surgeon:  Dr. Oswaldo Blessing Surgeon's Group or Practice Name:  [facility] Phone number:  [phone] Fax number:  [phone]   Type of Clearance Requested:   - Pharmacy:  Hold Clopidogrel  (Plavix ) Want to hold 5 days prior   Type of Anesthesia:   propofol    Additional requests/questions:    Tanner Fanny   01/27/2023, 11:24 AM

## 2023-01-29 NOTE — Telephone Encounter (Signed)
   Patient Name: Taylor [name]  DOB: 1945-06-08 MRN: [id number]  Primary Cardiologist: Kristeen Miss, MD  Chart reviewed as part of pre-operative protocol coverage.   Per office protocol, he may hold Plavix for 5 days prior to procedure and should resume as soon as hemodynamically stable postoperatively. Ideally aspirin should be continued without interruption however if surgical bleeding risk is elevated it may be held 5 to 7 days prior to procedure.   Napoleon Form, Leodis Rains, NP 01/29/2023, 6:28 AM

## 2023-03-02 DIAGNOSIS — D124 Benign neoplasm of descending colon: Secondary | ICD-10-CM | POA: Diagnosis not present

## 2023-03-02 DIAGNOSIS — K644 Residual hemorrhoidal skin tags: Secondary | ICD-10-CM | POA: Diagnosis not present

## 2023-03-02 DIAGNOSIS — D120 Benign neoplasm of cecum: Secondary | ICD-10-CM | POA: Diagnosis not present

## 2023-03-02 DIAGNOSIS — D125 Benign neoplasm of sigmoid colon: Secondary | ICD-10-CM | POA: Diagnosis not present

## 2023-03-02 DIAGNOSIS — D123 Benign neoplasm of transverse colon: Secondary | ICD-10-CM | POA: Diagnosis not present

## 2023-03-02 DIAGNOSIS — R933 Abnormal findings on diagnostic imaging of other parts of digestive tract: Secondary | ICD-10-CM | POA: Diagnosis not present

## 2023-03-02 DIAGNOSIS — K5730 Diverticulosis of large intestine without perforation or abscess without bleeding: Secondary | ICD-10-CM | POA: Diagnosis not present

## 2023-03-02 DIAGNOSIS — D122 Benign neoplasm of ascending colon: Secondary | ICD-10-CM | POA: Diagnosis not present

## 2023-03-05 DIAGNOSIS — D122 Benign neoplasm of ascending colon: Secondary | ICD-10-CM | POA: Diagnosis not present

## 2023-03-05 DIAGNOSIS — D124 Benign neoplasm of descending colon: Secondary | ICD-10-CM | POA: Diagnosis not present

## 2023-03-05 DIAGNOSIS — D123 Benign neoplasm of transverse colon: Secondary | ICD-10-CM | POA: Diagnosis not present

## 2023-03-05 DIAGNOSIS — D125 Benign neoplasm of sigmoid colon: Secondary | ICD-10-CM | POA: Diagnosis not present

## 2023-03-05 DIAGNOSIS — D120 Benign neoplasm of cecum: Secondary | ICD-10-CM | POA: Diagnosis not present

## 2023-04-06 DIAGNOSIS — N183 Chronic kidney disease, stage 3 unspecified: Secondary | ICD-10-CM | POA: Diagnosis not present

## 2023-04-06 DIAGNOSIS — I739 Peripheral vascular disease, unspecified: Secondary | ICD-10-CM | POA: Diagnosis not present

## 2023-04-06 DIAGNOSIS — E1142 Type 2 diabetes mellitus with diabetic polyneuropathy: Secondary | ICD-10-CM | POA: Diagnosis not present

## 2023-04-06 DIAGNOSIS — I10 Essential (primary) hypertension: Secondary | ICD-10-CM | POA: Diagnosis not present

## 2023-04-07 DIAGNOSIS — C4361 Malignant melanoma of right upper limb, including shoulder: Secondary | ICD-10-CM | POA: Diagnosis not present

## 2023-04-07 DIAGNOSIS — Z1159 Encounter for screening for other viral diseases: Secondary | ICD-10-CM | POA: Diagnosis not present

## 2023-04-07 DIAGNOSIS — Z79899 Other long term (current) drug therapy: Secondary | ICD-10-CM | POA: Diagnosis not present

## 2023-04-12 DIAGNOSIS — E039 Hypothyroidism, unspecified: Secondary | ICD-10-CM | POA: Diagnosis not present

## 2023-04-12 DIAGNOSIS — N183 Chronic kidney disease, stage 3 unspecified: Secondary | ICD-10-CM | POA: Diagnosis not present

## 2023-04-12 DIAGNOSIS — E113292 Type 2 diabetes mellitus with mild nonproliferative diabetic retinopathy without macular edema, left eye: Secondary | ICD-10-CM | POA: Diagnosis not present

## 2023-04-12 DIAGNOSIS — E7800 Pure hypercholesterolemia, unspecified: Secondary | ICD-10-CM | POA: Diagnosis not present

## 2023-04-15 DIAGNOSIS — D72829 Elevated white blood cell count, unspecified: Secondary | ICD-10-CM | POA: Diagnosis not present

## 2023-04-15 DIAGNOSIS — E039 Hypothyroidism, unspecified: Secondary | ICD-10-CM | POA: Diagnosis not present

## 2023-04-15 DIAGNOSIS — E113292 Type 2 diabetes mellitus with mild nonproliferative diabetic retinopathy without macular edema, left eye: Secondary | ICD-10-CM | POA: Diagnosis not present

## 2023-04-15 DIAGNOSIS — E7800 Pure hypercholesterolemia, unspecified: Secondary | ICD-10-CM | POA: Diagnosis not present

## 2023-04-15 DIAGNOSIS — I10 Essential (primary) hypertension: Secondary | ICD-10-CM | POA: Diagnosis not present

## 2023-04-19 DIAGNOSIS — D225 Melanocytic nevi of trunk: Secondary | ICD-10-CM | POA: Diagnosis not present

## 2023-04-19 DIAGNOSIS — L821 Other seborrheic keratosis: Secondary | ICD-10-CM | POA: Diagnosis not present

## 2023-04-19 DIAGNOSIS — Z85820 Personal history of malignant melanoma of skin: Secondary | ICD-10-CM | POA: Diagnosis not present

## 2023-04-19 DIAGNOSIS — Z1283 Encounter for screening for malignant neoplasm of skin: Secondary | ICD-10-CM | POA: Diagnosis not present

## 2023-04-19 DIAGNOSIS — Z08 Encounter for follow-up examination after completed treatment for malignant neoplasm: Secondary | ICD-10-CM | POA: Diagnosis not present

## 2023-05-04 ENCOUNTER — Ambulatory Visit (HOSPITAL_BASED_OUTPATIENT_CLINIC_OR_DEPARTMENT_OTHER): Admitting: Student

## 2023-05-04 ENCOUNTER — Ambulatory Visit (HOSPITAL_BASED_OUTPATIENT_CLINIC_OR_DEPARTMENT_OTHER)

## 2023-05-04 ENCOUNTER — Encounter (HOSPITAL_BASED_OUTPATIENT_CLINIC_OR_DEPARTMENT_OTHER): Payer: Self-pay | Admitting: Student

## 2023-05-04 DIAGNOSIS — G8929 Other chronic pain: Secondary | ICD-10-CM | POA: Diagnosis not present

## 2023-05-04 DIAGNOSIS — M79621 Pain in right upper arm: Secondary | ICD-10-CM | POA: Diagnosis not present

## 2023-05-04 DIAGNOSIS — M25511 Pain in right shoulder: Secondary | ICD-10-CM | POA: Diagnosis not present

## 2023-05-04 DIAGNOSIS — M79601 Pain in right arm: Secondary | ICD-10-CM

## 2023-05-04 NOTE — Progress Notes (Signed)
 Chief Complaint: Right shoulder pain    Discussed the use of AI scribe software for clinical note transcription with the patient, who gave verbal consent to proceed.  History of Present Illness The patient, with a history of recurrent melanoma of the right axilla, presents with worsening right shoulder pain over the past year. The pain is severe, affecting sleep and daily activities. The patient describes the pain as being located from the shoulder to the elbow, with no radiation past the elbow. The pain is severe enough to wake the patient from sleep and requires the patient to physically lift the arm in order to move it. The patient has been managing the pain with Tylenol , which provides minimal relief. The patient has a history of a significant fall through a deck 1 year ago, resulting in hospitalization due to rib and vertebral fractures as well as a pneumothorax. The patient does not recall any other injuries to the shoulder.  She is right-hand dominant.   Surgical History:   Right axilla lymph node excisions  PMH/PSH/Family History/Social History/Meds/Allergies:    Past Medical History:  Diagnosis Date   Cancer of left breast (HCC)    Complication of anesthesia    Coronary artery disease    a. 2006 s/p CABG x 4 (LIMA->LAD, VG->Diag->OM, VG->RPDA);  b. 02/2015: [place]  s/p DES to [facility], VG->PDA & VG->D1->OM 100; c. 06/2015 Cath: diffuse dzs->Med rx; d. 11/2015 Cath/PCI: LM nl, LAD 100ost, 95d, D1 90ost, LCX 35ost, 65p, OM2 40, lat OM2 90, RCA 95p (2.75x16 Synergy DES), patent mid stent, RPLB 70, LIMA->LAD ok.EF55-65%.   Depression    Hx of tobacco use, presenting hazards to health    a. quit 2006.   Hypercholesteremia    Hypertension    Hypertensive heart disease    Echo 04/2019: Inferior HK, EF 55, normal RV SF, RVSP normal at 29.2, mild BAE, trivial MR, mild aortic valve sclerosis without stenosis   Left carotid bruit    a. 09/2014 Carotid U/S: 1-39%  bilat ICA stenosis.   PAD (peripheral artery disease) (HCC)    a. 11/2014 ABI: R: 0.63, L 0.59;  b. 11/2014 Periph Angio: bilat pop occlusions. L - short w/ reconstituion via collats in dist pop w/ 2 vessel runoff, R long w/ reconstitution in prox tib/peroneal arteries-->med Rx w/ pletal .   Pulmonary embolism (HCC) a. 2014.   Reflux esophagitis    Type II diabetes mellitus (HCC)    Past Surgical History:  Procedure Laterality Date   BREAST BIOPSY Left ~ 2012   BUNIONECTOMY Left 1970s   CARDIAC CATHETERIZATION  04/2004   CARDIAC CATHETERIZATION N/A 02/21/2015   Procedure: Left Heart Cath and Cors/Grafts Angiography;  Surgeon: Arty Binning, MD;  Location[facility] INVASIVE CV LAB;  Service: Cardiovascular;  Laterality: N/A;   CARDIAC CATHETERIZATION N/A 02/21/2015   Procedure: Coronary Stent Intervention;  Surgeon: Arty Binning, MD;  Location: [facility] INVASIVE CV LAB;  Service: Cardiovascular;  Laterality: N/A;   CARDIAC CATHETERIZATION N/A 02/21/2015   Procedure: Intravascular Pressure Wire/FFR Study;  Surgeon: Arty Binning, MD;  Location: [facility] INVASIVE CV LAB;  Service: Cardiovascular;  Laterality: N/A;   CARDIAC CATHETERIZATION N/A 06/20/2015   Procedure: Left Heart Cath and Cors/Grafts Angiography;  Surgeon: Darlis Eisenmenger, MD;  Location: [facility] INVASIVE CV LAB;  Service: Cardiovascular;  Laterality:  N/A;   CARDIAC CATHETERIZATION N/A 11/19/2015   Procedure: Left Heart Cath and Cors/Grafts Angiography;  Surgeon: Peter M Swaziland, MD;  Location: [facility] INVASIVE CV LAB;  Service: Cardiovascular;  Laterality: N/A;   CARDIAC CATHETERIZATION N/A 11/19/2015   Procedure: Coronary Stent Intervention;  Surgeon: Peter M Swaziland, MD;  Location: [facility] INVASIVE CV LAB;  Service: Cardiovascular;  Laterality: N/A;   CATARACT EXTRACTION W/ INTRAOCULAR LENS  IMPLANT, BILATERAL Bilateral 1980s-1990s   CORONARY ANGIOPLASTY WITH STENT PLACEMENT  02/21/2015   "1 stent"   CORONARY ARTERY BYPASS GRAFT  05/2004   "CABG X4"   EYE SURGERY      LAPAROSCOPIC CHOLECYSTECTOMY  1990s   MASTECTOMY Left ~ 2012   PERIPHERAL VASCULAR CATHETERIZATION N/A 12/12/2014   Procedure: Abdominal Aortogram;  Surgeon: Wenona Hamilton, MD;  Location: [facility] INVASIVE CV LAB;  Service: Cardiovascular;  Laterality: N/A;   RETINAL DETACHMENT SURGERY Right 1990s   Social History   Socioeconomic History   Marital status: Married    Spouse name: Not on file   Number of children: Not on file   Years of education: Not on file   Highest education level: Not on file  Occupational History   Not on file  Tobacco Use   Smoking status: Former    Current packs/day: 0.00    Average packs/day: 0.5 packs/day for 15.0 years (7.5 ttl pk-yrs)    Types: Cigarettes    Start date: 05/11/1989    Quit date: 05/11/2004    Years since quitting: 18.9   Smokeless tobacco: Never  Vaping Use   Vaping status: Never Used  Substance and Sexual Activity   Alcohol use: No   Drug use: No   Sexual activity: Not Currently  Other Topics Concern   Not on file  Social History Narrative   Lives in [place] with her husband.   Social Drivers of Corporate investment banker Strain: Not on file  Food Insecurity: No Food Insecurity (04/24/2022)   Hunger Vital Sign    Worried About Running Out of Food in the Last Year: Never true    Ran Out of Food in the Last Year: Never true  Transportation Needs: No Transportation Needs (04/24/2022)   PRAPARE - Administrator, Civil Service (Medical): No    Lack of Transportation (Non-Medical): No  Physical Activity: Not on file  Stress: Not on file  Social Connections: Not on file   Family History  Problem Relation Age of Onset   Coronary artery disease Mother 27       status post CABG    Heart attack Father 57       deceased secondary to fatal  first myocardial infarction   Coronary artery disease Sister 30       alive had bypass surgery   Cancer Sister    Deep vein thrombosis Cousin        Neice had after knee surgery    Allergies  Allergen Reactions   Niacin Other (See Comments)    burning   Duloxetine     Other reaction(s): Other (See Comments)   Amoxicillin Rash   Ibuprofen  Rash   Lipitor [Atorvastatin ] Other (See Comments)    Muscle pain   Current Outpatient Medications  Medication Sig Dispense Refill   acetaminophen  (TYLENOL ) 500 MG tablet Take 1,000 mg by mouth every 6 (six) hours as needed for headache (pain).      ALPRAZolam  (XANAX ) 0.25 MG tablet Take 0.25 mg by mouth daily as  needed for anxiety.      aspirin  81 MG tablet Take 81 mg by mouth daily.     clopidogrel  (PLAVIX ) 75 MG tablet Take 1 tablet by mouth once daily 90 tablet 3   isosorbide  mononitrate (IMDUR ) 60 MG 24 hr tablet Take 1 tablet by mouth once daily 90 tablet 3   levothyroxine  (SYNTHROID ) 75 MCG tablet Take 75 mcg by mouth daily before breakfast.     lisinopril  (ZESTRIL ) 2.5 MG tablet Take 1 tablet (2.5 mg total) by mouth daily. 90 tablet 3   nitroGLYCERIN  (NITROSTAT ) 0.4 MG SL tablet Place 1 tablet (0.4 mg total) under the tongue every 5 (five) minutes as needed for chest pain. 25 tablet 3   oxyCODONE  (OXY IR/ROXICODONE ) 5 MG immediate release tablet Take 1 tablet (5 mg total) by mouth 2 (two) times daily as needed for moderate pain or severe pain (2.5mg  for moderate pain, 5mg  for severe pain). 10 tablet 0   repaglinide (PRANDIN) 2 MG tablet Take 2 mg by mouth 2 (two) times daily before a meal.     rosuvastatin  (CRESTOR ) 20 MG tablet TAKE ONE TABLET BY MOUTH ONCE DAILY AT  6PM (Patient taking differently: Take 20 mg by mouth daily.) 90 tablet 3   sertraline  (ZOLOFT ) 100 MG tablet Take 100 mg by mouth daily.      timolol  (BETIMOL ) 0.25 % ophthalmic solution 1-2 drops 2 (two) times daily.     No current facility-administered medications for this visit.   Facility-Administered Medications Ordered in Other Visits  Medication Dose Route Frequency Provider Last Rate Last Admin   aminophylline  injection 75 mg  75 mg Intravenous  BID PRN McLean, Dalton S, MD   75 mg at 08/08/14 1335   No results found.  Review of Systems:   A ROS was performed including pertinent positives and negatives as documented in the HPI.  Physical Exam :   Constitutional: NAD and appears stated age Neurological: Alert and oriented Psych: Appropriate affect and cooperative There were no vitals taken for this visit.   Comprehensive Musculoskeletal Exam:    Tenderness in the right shoulder over the anterior glenohumeral joint, AC joint, and lateral deltoid.  Active right shoulder range of motion to 130 degrees flexion, 20 degrees ER, IR to back pocket, and abduction to 80 degrees.  Contralateral shoulder motion to 160 flexion, 30 ER, and IR to L5.  Positive empty can and Neer/Hawkins with negative drop arm test.  Distal neurosensory exam intact.  Imaging:   Xray (right humerus 2 views): Moderate glenohumeral and AC joint osteoarthritis with spurring noted medially of the humeral head.   I personally reviewed and interpreted the radiographs.      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  X-ray does show some moderate appearing arthritis within the [facility] and glenohumeral joints.  Given her exam today, I do have some concern over the integrity of the rotator cuff given the weakness and range of motion deficits.  At this point an MRI is recommended for further assessment to determine treatment plan. Schedule a follow-up with Dr. Hermina Loosen in three weeks to review MRI results and discuss treatment.     I personally saw and evaluated the patient, and participated in the management and treatment plan.  Sharrell Deck, PA-C Orthopedics

## 2023-05-05 DIAGNOSIS — I739 Peripheral vascular disease, unspecified: Secondary | ICD-10-CM | POA: Diagnosis not present

## 2023-05-05 DIAGNOSIS — I10 Essential (primary) hypertension: Secondary | ICD-10-CM | POA: Diagnosis not present

## 2023-05-05 DIAGNOSIS — E1142 Type 2 diabetes mellitus with diabetic polyneuropathy: Secondary | ICD-10-CM | POA: Diagnosis not present

## 2023-05-05 DIAGNOSIS — N183 Chronic kidney disease, stage 3 unspecified: Secondary | ICD-10-CM | POA: Diagnosis not present

## 2023-05-10 DIAGNOSIS — M79672 Pain in left foot: Secondary | ICD-10-CM | POA: Diagnosis not present

## 2023-05-10 DIAGNOSIS — I739 Peripheral vascular disease, unspecified: Secondary | ICD-10-CM | POA: Diagnosis not present

## 2023-05-10 DIAGNOSIS — I10 Essential (primary) hypertension: Secondary | ICD-10-CM | POA: Diagnosis not present

## 2023-05-10 DIAGNOSIS — E113292 Type 2 diabetes mellitus with mild nonproliferative diabetic retinopathy without macular edema, left eye: Secondary | ICD-10-CM | POA: Diagnosis not present

## 2023-05-10 DIAGNOSIS — H6122 Impacted cerumen, left ear: Secondary | ICD-10-CM | POA: Diagnosis not present

## 2023-05-10 DIAGNOSIS — E7800 Pure hypercholesterolemia, unspecified: Secondary | ICD-10-CM | POA: Diagnosis not present

## 2023-05-10 DIAGNOSIS — Z6828 Body mass index (BMI) 28.0-28.9, adult: Secondary | ICD-10-CM | POA: Diagnosis not present

## 2023-05-10 DIAGNOSIS — E039 Hypothyroidism, unspecified: Secondary | ICD-10-CM | POA: Diagnosis not present

## 2023-05-10 DIAGNOSIS — F419 Anxiety disorder, unspecified: Secondary | ICD-10-CM | POA: Diagnosis not present

## 2023-05-10 DIAGNOSIS — Z0000 Encounter for general adult medical examination without abnormal findings: Secondary | ICD-10-CM | POA: Diagnosis not present

## 2023-05-10 DIAGNOSIS — N183 Chronic kidney disease, stage 3 unspecified: Secondary | ICD-10-CM | POA: Diagnosis not present

## 2023-05-11 ENCOUNTER — Ambulatory Visit
Admission: RE | Admit: 2023-05-11 | Discharge: 2023-05-11 | Disposition: A | Discharge status: Home / Self Care | Source: Ambulatory Visit | Attending: Student

## 2023-05-11 DIAGNOSIS — G8929 Other chronic pain: Secondary | ICD-10-CM

## 2023-05-12 DIAGNOSIS — E1142 Type 2 diabetes mellitus with diabetic polyneuropathy: Secondary | ICD-10-CM | POA: Diagnosis not present

## 2023-05-12 DIAGNOSIS — E039 Hypothyroidism, unspecified: Secondary | ICD-10-CM | POA: Diagnosis not present

## 2023-05-12 DIAGNOSIS — I10 Essential (primary) hypertension: Secondary | ICD-10-CM | POA: Diagnosis not present

## 2023-05-12 DIAGNOSIS — N183 Chronic kidney disease, stage 3 unspecified: Secondary | ICD-10-CM | POA: Diagnosis not present

## 2023-05-12 DIAGNOSIS — I739 Peripheral vascular disease, unspecified: Secondary | ICD-10-CM | POA: Diagnosis not present

## 2023-05-12 DIAGNOSIS — E113292 Type 2 diabetes mellitus with mild nonproliferative diabetic retinopathy without macular edema, left eye: Secondary | ICD-10-CM | POA: Diagnosis not present

## 2023-05-12 DIAGNOSIS — E7800 Pure hypercholesterolemia, unspecified: Secondary | ICD-10-CM | POA: Diagnosis not present

## 2023-05-21 ENCOUNTER — Ambulatory Visit (INDEPENDENT_AMBULATORY_CARE_PROVIDER_SITE_OTHER): Admitting: Podiatry

## 2023-05-21 DIAGNOSIS — L84 Corns and callosities: Secondary | ICD-10-CM | POA: Diagnosis not present

## 2023-05-21 NOTE — Progress Notes (Unsigned)
 Subjective:  Patient ID: Taylor [name], female    DOB: 1945-02-19,  MRN: [id number]  Chief Complaint  Patient presents with   Toe Pain    Left foot place between 4th and 5th toe causing some discomfort     78 y.o. female presents with the above complaint.  Patient presents with left fourth interspace heloma molle painful to touch is progressive and worse worse with ambulation or shoe pressure is causing some discomfort to get it evaluated pain scale 7 out of 10 dull aching nature she would like to discuss treatment options for this.   Review of Systems: Negative except as noted in the HPI. Denies N/V/F/Ch.  Past Medical History:  Diagnosis Date   Cancer of left breast (HCC)    Complication of anesthesia    Coronary artery disease    a. 2006 s/p CABG x 4 (LIMA->LAD, VG->Diag->OM, VG->RPDA);  b. 02/2015: [place]  s/p DES to [facility], VG->PDA & VG->D1->OM 100; c. 06/2015 Cath: diffuse dzs->Med rx; d. 11/2015 Cath/PCI: LM nl, LAD 100ost, 95d, D1 90ost, LCX 35ost, 65p, OM2 40, lat OM2 90, RCA 95p (2.75x16 Synergy DES), patent mid stent, RPLB 70, LIMA->LAD ok.EF55-65%.   Depression    Hx of tobacco use, presenting hazards to health    a. quit 2006.   Hypercholesteremia    Hypertension    Hypertensive heart disease    Echo 04/2019: Inferior HK, EF 55, normal RV SF, RVSP normal at 29.2, mild BAE, trivial MR, mild aortic valve sclerosis without stenosis   Left carotid bruit    a. 09/2014 Carotid U/S: 1-39% bilat ICA stenosis.   PAD (peripheral artery disease) (HCC)    a. 11/2014 ABI: R: 0.63, L 0.59;  b. 11/2014 Periph Angio: bilat pop occlusions. L - short w/ reconstituion via collats in dist pop w/ 2 vessel runoff, R long w/ reconstitution in prox tib/peroneal arteries-->med Rx w/ pletal .   Pulmonary embolism (HCC) a. 2014.   Reflux esophagitis    Type II diabetes mellitus (HCC)     Current Outpatient Medications:    acetaminophen  (TYLENOL ) 500 MG tablet, Take 1,000 mg by mouth every 6 (six) hours  as needed for headache (pain). , Disp: , Rfl:    ALPRAZolam  (XANAX ) 0.25 MG tablet, Take 0.25 mg by mouth daily as needed for anxiety. , Disp: , Rfl:    aspirin  81 MG tablet, Take 81 mg by mouth daily., Disp: , Rfl:    clopidogrel  (PLAVIX ) 75 MG tablet, Take 1 tablet by mouth once daily, Disp: 90 tablet, Rfl: 3   isosorbide  mononitrate (IMDUR ) 60 MG 24 hr tablet, Take 1 tablet by mouth once daily, Disp: 90 tablet, Rfl: 2   levothyroxine  (SYNTHROID ) 75 MCG tablet, Take 75 mcg by mouth daily before breakfast., Disp: , Rfl:    lisinopril  (ZESTRIL ) 2.5 MG tablet, Take 1 tablet (2.5 mg total) by mouth daily., Disp: 90 tablet, Rfl: 3   nitroGLYCERIN  (NITROSTAT ) 0.4 MG SL tablet, Place 1 tablet (0.4 mg total) under the tongue every 5 (five) minutes as needed for chest pain., Disp: 25 tablet, Rfl: 3   oxyCODONE  (OXY IR/ROXICODONE ) 5 MG immediate release tablet, Take 1 tablet (5 mg total) by mouth 2 (two) times daily as needed for moderate pain or severe pain (2.5mg  for moderate pain, 5mg  for severe pain)., Disp: 10 tablet, Rfl: 0   repaglinide (PRANDIN) 2 MG tablet, Take 2 mg by mouth 2 (two) times daily before a meal., Disp: , Rfl:    rosuvastatin  (CRESTOR )  20 MG tablet, TAKE ONE TABLET BY MOUTH ONCE DAILY AT  6PM (Patient taking differently: Take 20 mg by mouth daily.), Disp: 90 tablet, Rfl: 3   sertraline  (ZOLOFT ) 100 MG tablet, Take 100 mg by mouth daily. , Disp: , Rfl:    timolol  (BETIMOL ) 0.25 % ophthalmic solution, 1-2 drops 2 (two) times daily., Disp: , Rfl:  No current facility-administered medications for this visit.  Facility-Administered Medications Ordered in Other Visits:    aminophylline  injection 75 mg, 75 mg, Intravenous, BID PRN, McLean, Dalton S, MD, 75 mg at 08/08/14 1335  Social History   Tobacco Use  Smoking Status Former   Current packs/day: 0.00   Average packs/day: 0.5 packs/day for 15.0 years (7.5 ttl pk-yrs)   Types: Cigarettes   Start date: 05/11/1989   Quit date:  05/11/2004   Years since quitting: 19.0  Smokeless Tobacco Never    Allergies  Allergen Reactions   Niacin Other (See Comments)    burning   Duloxetine     Other reaction(s): Other (See Comments)   Amoxicillin Rash   Ibuprofen  Rash   Lipitor [Atorvastatin ] Other (See Comments)    Muscle pain   Objective:  There were no vitals filed for this visit. There is no height or weight on file to calculate BMI. Constitutional Well developed. Well nourished.  Vascular Dorsalis pedis pulses palpable bilaterally. Posterior tibial pulses palpable bilaterally. Capillary refill normal to all digits.  No cyanosis or clubbing noted. Pedal hair growth normal.  Neurologic Normal speech. Oriented to person, place, and time. Epicritic sensation to light touch grossly present bilaterally.  Dermatologic Left fourth interspace heloma molle with interdigital space pain.  No open wounds or lesion noted.  Mild hammertoe contracture noted.  Orthopedic: Normal joint ROM without pain or crepitus bilaterally. No visible deformities. No bony tenderness.   Radiographs: None Assessment:   1. Heloma molle    Plan:  Patient was evaluated and treated and all questions answered.  Left 4th and 5th digit heloma molle - All questions or concerns were discussed with the patient extensively given Himabindu sideration of benefit from debridement of the lesion using chisel blade handle the lesion was removed under additional tissue.  No complication noted open pulmonary - Shoe gear modification discussed - Toe spacers were dispensed  No follow-ups on file.

## 2023-05-25 ENCOUNTER — Other Ambulatory Visit: Payer: Self-pay | Admitting: Cardiovascular Disease

## 2023-05-27 ENCOUNTER — Other Ambulatory Visit (HOSPITAL_BASED_OUTPATIENT_CLINIC_OR_DEPARTMENT_OTHER): Payer: Self-pay

## 2023-05-27 ENCOUNTER — Ambulatory Visit (HOSPITAL_BASED_OUTPATIENT_CLINIC_OR_DEPARTMENT_OTHER): Admitting: Orthopaedic Surgery

## 2023-05-27 DIAGNOSIS — G8929 Other chronic pain: Secondary | ICD-10-CM | POA: Diagnosis not present

## 2023-05-27 DIAGNOSIS — M25511 Pain in right shoulder: Secondary | ICD-10-CM

## 2023-05-27 NOTE — Progress Notes (Signed)
 Chief Complaint: Right shoulder pain      05/27/2023: Presents today for follow-up of the right shoulder.  At this time she is having persistent glenohumeral based symptoms   History of Present Illness The patient, with a history of recurrent melanoma of the right axilla, presents with worsening right shoulder pain over the past year. The pain is severe, affecting sleep and daily activities. The patient describes the pain as being located from the shoulder to the elbow, with no radiation past the elbow. The pain is severe enough to wake the patient from sleep and requires the patient to physically lift the arm in order to move it. The patient has been managing the pain with Tylenol , which provides minimal relief. The patient has a history of a significant fall through a deck 1 year ago, resulting in hospitalization due to rib and vertebral fractures as well as a pneumothorax. The patient does not recall any other injuries to the shoulder.  She is right-hand dominant.     Surgical History:   Right axilla lymph node excisions   PMH/PSH/Family History/Social History/Meds/Allergies:         Past Medical History:  Diagnosis Date   Cancer of left breast (HCC)     Complication of anesthesia     Coronary artery disease      a. 2006 s/p CABG x 4 (LIMA->LAD, VG->Diag->OM, VG->RPDA);  b. 02/2015: [place]  s/p DES to [facility], VG->PDA & VG->D1->OM 100; c. 06/2015 Cath: diffuse dzs->Med rx; d. 11/2015 Cath/PCI: LM nl, LAD 100ost, 95d, D1 90ost, LCX 35ost, 65p, OM2 40, lat OM2 90, RCA 95p (2.75x16 Synergy DES), patent mid stent, RPLB 70, LIMA->LAD ok.EF55-65%.   Depression     Hx of tobacco use, presenting hazards to health      a. quit 2006.   Hypercholesteremia     Hypertension     Hypertensive heart disease      Echo 04/2019: Inferior HK, EF 55, normal RV SF, RVSP normal at 29.2, mild BAE, trivial MR, mild aortic valve sclerosis without stenosis   Left carotid bruit      a. 09/2014  Carotid U/S: 1-39% bilat ICA stenosis.   PAD (peripheral artery disease) (HCC)      a. 11/2014 ABI: R: 0.63, L 0.59;  b. 11/2014 Periph Angio: bilat pop occlusions. L - short w/ reconstituion via collats in dist pop w/ 2 vessel runoff, R long w/ reconstitution in prox tib/peroneal arteries-->med Rx w/ pletal .   Pulmonary embolism (HCC) a. 2014.   Reflux esophagitis     Type II diabetes mellitus (HCC)               Past Surgical History:  Procedure Laterality Date   BREAST BIOPSY Left ~ 2012   BUNIONECTOMY Left 1970s   CARDIAC CATHETERIZATION   04/2004   CARDIAC CATHETERIZATION N/A 02/21/2015    Procedure: Left Heart Cath and Cors/Grafts Angiography;  Surgeon: Arty Binning, MD;  Location: [facility] INVASIVE CV LAB;  Service: Cardiovascular;  Laterality: N/A;   CARDIAC CATHETERIZATION N/A 02/21/2015    Procedure: Coronary Stent Intervention;  Surgeon: Arty Binning, MD;  Location: [facility] INVASIVE CV LAB;  Service: Cardiovascular;  Laterality: N/A;   CARDIAC CATHETERIZATION N/A 02/21/2015    Procedure: Intravascular Pressure Wire/FFR Study;  Surgeon: Arty Binning, MD;  Location: [facility] INVASIVE CV LAB;  Service: Cardiovascular;  Laterality: N/A;   CARDIAC CATHETERIZATION N/A 06/20/2015    Procedure: Left Heart Cath and Cors/Grafts Angiography;  Surgeon: Darlis Eisenmenger, MD;  Location: [facility] INVASIVE CV LAB;  Service: Cardiovascular;  Laterality: N/A;   CARDIAC CATHETERIZATION N/A 11/19/2015    Procedure: Left Heart Cath and Cors/Grafts Angiography;  Surgeon: Peter M Swaziland, MD;  Location: [facility] INVASIVE CV LAB;  Service: Cardiovascular;  Laterality: N/A;   CARDIAC CATHETERIZATION N/A 11/19/2015    Procedure: Coronary Stent Intervention;  Surgeon: Peter M Swaziland, MD;  Location: [facility] INVASIVE CV LAB;  Service: Cardiovascular;  Laterality: N/A;   CATARACT EXTRACTION W/ INTRAOCULAR LENS  IMPLANT, BILATERAL Bilateral 1980s-1990s   CORONARY ANGIOPLASTY WITH STENT PLACEMENT   02/21/2015    "1 stent"   CORONARY ARTERY BYPASS GRAFT    05/2004    "CABG X4"   EYE SURGERY       LAPAROSCOPIC CHOLECYSTECTOMY   1990s   MASTECTOMY Left ~ 2012   PERIPHERAL VASCULAR CATHETERIZATION N/A 12/12/2014    Procedure: Abdominal Aortogram;  Surgeon: Wenona Hamilton, MD;  Location: [facility] INVASIVE CV LAB;  Service: Cardiovascular;  Laterality: N/A;   RETINAL DETACHMENT SURGERY Right 1990s        Social History         Socioeconomic History   Marital status: Married      Spouse name: Not on file   Number of children: Not on file   Years of education: Not on file   Highest education level: Not on file  Occupational History   Not on file  Tobacco Use   Smoking status: Former      Current packs/day: 0.00      Average packs/day: 0.5 packs/day for 15.0 years (7.5 ttl pk-yrs)      Types: Cigarettes      Start date: 05/11/1989      Quit date: 05/11/2004      Years since quitting: 18.9   Smokeless tobacco: Never  Vaping Use   Vaping status: Never Used  Substance and Sexual Activity   Alcohol use: No   Drug use: No   Sexual activity: Not Currently  Other Topics Concern   Not on file  Social History Narrative    Lives in [place] with her husband.    Social Drivers of Acupuncturist Strain: Not on file  Food Insecurity: No Food Insecurity (04/24/2022)    Hunger Vital Sign     Worried About Running Out of Food in the Last Year: Never true     Ran Out of Food in the Last Year: Never true  Transportation Needs: No Transportation Needs (04/24/2022)    PRAPARE - Therapist, art (Medical): No     Lack of Transportation (Non-Medical): No  Physical Activity: Not on file  Stress: Not on file  Social Connections: Not on file         Family History  Problem Relation Age of Onset   Coronary artery disease Mother 37        status post CABG    Heart attack Father 41        deceased secondary to fatal  first myocardial infarction   Coronary artery disease Sister 78        alive had  bypass surgery   Cancer Sister     Deep vein thrombosis Cousin          Neice had after knee surgery        Allergies       Allergies  Allergen Reactions  Niacin Other (See Comments)      burning   Duloxetine        Other reaction(s): Other (See Comments)   Amoxicillin Rash   Ibuprofen  Rash   Lipitor [Atorvastatin ] Other (See Comments)      Muscle pain            Current Outpatient Medications  Medication Sig Dispense Refill   acetaminophen  (TYLENOL ) 500 MG tablet Take 1,000 mg by mouth every 6 (six) hours as needed for headache (pain).        ALPRAZolam  (XANAX ) 0.25 MG tablet Take 0.25 mg by mouth daily as needed for anxiety.        aspirin  81 MG tablet Take 81 mg by mouth daily.       clopidogrel  (PLAVIX ) 75 MG tablet Take 1 tablet by mouth once daily 90 tablet 3   isosorbide  mononitrate (IMDUR ) 60 MG 24 hr tablet Take 1 tablet by mouth once daily 90 tablet 3   levothyroxine  (SYNTHROID ) 75 MCG tablet Take 75 mcg by mouth daily before breakfast.       lisinopril  (ZESTRIL ) 2.5 MG tablet Take 1 tablet (2.5 mg total) by mouth daily. 90 tablet 3   nitroGLYCERIN  (NITROSTAT ) 0.4 MG SL tablet Place 1 tablet (0.4 mg total) under the tongue every 5 (five) minutes as needed for chest pain. 25 tablet 3   oxyCODONE  (OXY IR/ROXICODONE ) 5 MG immediate release tablet Take 1 tablet (5 mg total) by mouth 2 (two) times daily as needed for moderate pain or severe pain (2.5mg  for moderate pain, 5mg  for severe pain). 10 tablet 0   repaglinide (PRANDIN) 2 MG tablet Take 2 mg by mouth 2 (two) times daily before a meal.       rosuvastatin  (CRESTOR ) 20 MG tablet TAKE ONE TABLET BY MOUTH ONCE DAILY AT  6PM (Patient taking differently: Take 20 mg by mouth daily.) 90 tablet 3   sertraline  (ZOLOFT ) 100 MG tablet Take 100 mg by mouth daily.        timolol  (BETIMOL ) 0.25 % ophthalmic solution 1-2 drops 2 (two) times daily.          No current facility-administered medications for this visit.              Facility-Administered Medications Ordered in Other Visits  Medication Dose Route Frequency Provider Last Rate Last Admin   aminophylline  injection 75 mg  75 mg Intravenous BID PRN McLean, Dalton S, MD   75 mg at 08/08/14 1335      Imaging Results (Last 48 hours)  No results found.     Review of Systems:   A ROS was performed including pertinent positives and negatives as documented in the HPI.   Physical Exam :   Constitutional: NAD and appears stated age Neurological: Alert and oriented Psych: Appropriate affect and cooperative There were no vitals taken for this visit.    Comprehensive Musculoskeletal Exam:     Tenderness in the right shoulder over the anterior glenohumeral joint, AC joint, and lateral deltoid.  Active right shoulder range of motion to 130 degrees flexion, 20 degrees ER, IR to back pocket, and abduction to 80 degrees.  Contralateral shoulder motion to 160 flexion, 30 ER, and IR to L5.  Positive empty can and Neer/Hawkins with negative drop arm test.  Distal neurosensory exam intact.   Imaging:   Xray (right humerus 2 views): Moderate glenohumeral and AC joint osteoarthritis with spurring noted medially of the humeral head.   MRI right shoulder:  Thickness rotator cuff tear with inferior full-thickness cartilage loss about the glenohumeral joint   I personally reviewed and interpreted the radiographs.          Assessment & Plan 79 year old female with right shoulder pain in the setting rotator cuff tear as well as arthritis consistent with rotator cuff arthropathy.  This time we did discuss treatment options.  She would initially like to proceed with a right ultrasound-guided shoulder injection.  Will plan to proceed with this today.  I will plan to see her back in 6 weeks to check her progress   - Right shoulder ultrasound-guided injection provide after verbal consent obtained         I personally saw and evaluated the patient, and participated in the  management and treatment plan.

## 2023-06-04 DIAGNOSIS — E1142 Type 2 diabetes mellitus with diabetic polyneuropathy: Secondary | ICD-10-CM | POA: Diagnosis not present

## 2023-06-04 DIAGNOSIS — I739 Peripheral vascular disease, unspecified: Secondary | ICD-10-CM | POA: Diagnosis not present

## 2023-06-04 DIAGNOSIS — N183 Chronic kidney disease, stage 3 unspecified: Secondary | ICD-10-CM | POA: Diagnosis not present

## 2023-06-04 DIAGNOSIS — I10 Essential (primary) hypertension: Secondary | ICD-10-CM | POA: Diagnosis not present

## 2023-06-12 DIAGNOSIS — E039 Hypothyroidism, unspecified: Secondary | ICD-10-CM | POA: Diagnosis not present

## 2023-06-12 DIAGNOSIS — E113292 Type 2 diabetes mellitus with mild nonproliferative diabetic retinopathy without macular edema, left eye: Secondary | ICD-10-CM | POA: Diagnosis not present

## 2023-06-12 DIAGNOSIS — N183 Chronic kidney disease, stage 3 unspecified: Secondary | ICD-10-CM | POA: Diagnosis not present

## 2023-06-12 DIAGNOSIS — E7800 Pure hypercholesterolemia, unspecified: Secondary | ICD-10-CM | POA: Diagnosis not present

## 2023-06-28 ENCOUNTER — Encounter: Payer: Self-pay | Admitting: Cardiovascular Disease

## 2023-06-28 NOTE — Progress Notes (Signed)
 Cardiology Office Note:    Date:  06/29/2023   ID:  Taylor [name], DOB [birth year], MRN [id number]  PCP:  Okey Carlin Redbird, MD  Cardiologist:  Aleene Passe, MD    Referring MD: Okey Carlin Redbird, MD   Problem list 1. Coronary artery disease-status post coronary artery bypass grafting 2006 2. Peripheral vascular disease 3. History pulmonary embolus 4. History of breast cancer 5. Essential hypertension 6. Hyperlipidemia 7. Carotid artery disease 8. Diabetes  mellitus   Chief Complaint  Patient presents with   Hypertension        Hyperlipidemia        Taylor [name] is a 78 y.o. female with a hx of Coronary artery disease, hypertension, hyperlipidemia and peripheral vascular disease. She denies having any episodes of chest pain or shortness of breath. She complains of having lots of leg aches and pains. Symptoms do not sound like claudication. She has known occlusion of her popliteal arteries.  Nov. 8, 2019: Taylor [name] has a history of coronary artery disease and coronary artery bypass grafting in 2008.  She also has a history of hypertension, hyperlipidemia and peripheral vascular disease.  Is having DOE  Walking is limited by her claudication  Sees Dr. Darron for her PVC  On plavix  after a stento placement in Nov. 2017   December 13, 2018:  Taylor [name] is seen today for follow-up of her coronary artery disease status post coronary artery bypass grafting in 2008.  She has a history of hypertension, hyperlipidemia, PAD.  She has been followed by Dr. Darron for PAD. No angina pain  Has palpitations  Has worsening DOE for the past 5 months  Has not had much cp with her angina episodes  Typically weakness and DOE are her angina equivalent .  Lipids have been managed by her primary medical doctor and have typically looked good.  Feb. 16, 2022: Taylor [name] is seen today for follow up of her CAD, CABG, HTN, HLD No CP , no dyspnea.  Has been seen by scott since I last saw her  Labs from  Dr. Okey were reviewed Trigs are elevated 215 - LDL is 42  HbA1c is 8.1  She eats lots of chocolate   May 12, 2021 Taylor [name] is seen today  No cp,   has some DOE  when she climbs stairs too quickly   Has   Trigs are still very elevated.   Trying to reduce her intake of carbs and chocolate   June 29, 2023 Taylor [name] is seen for follow up of her HLD, CAD  Some DOE with walking  Tries to walk,  She is limited by a previous fall last     Past Medical History:  Diagnosis Date   Cancer of left breast (HCC)    Complication of anesthesia    Coronary artery disease    a. 2006 s/p CABG x 4 (LIMA->LAD, VG->Diag->OM, VG->RPDA);  b. 02/2015: [place]  s/p DES to [facility], VG->PDA & VG->D1->OM 100; c. 06/2015 Cath: diffuse dzs->Med rx; d. 11/2015 Cath/PCI: LM nl, LAD 100ost, 95d, D1 90ost, LCX 35ost, 65p, OM2 40, lat OM2 90, RCA 95p (2.75x16 Synergy DES), patent mid stent, RPLB 70, LIMA->LAD ok.EF55-65%.   Depression    Hx of tobacco use, presenting hazards to health    a. quit 2006.   Hypercholesteremia    Hypertension    Hypertensive heart disease    Echo 04/2019: Inferior HK, EF 55, normal RV SF, RVSP normal at 29.2, mild BAE, trivial MR, mild aortic  valve sclerosis without stenosis   Left carotid bruit    a. 09/2014 Carotid U/S: 1-39% bilat ICA stenosis.   PAD (peripheral artery disease) (HCC)    a. 11/2014 ABI: R: 0.63, L 0.59;  b. 11/2014 Periph Angio: bilat pop occlusions. L - short w/ reconstituion via collats in dist pop w/ 2 vessel runoff, R long w/ reconstitution in prox tib/peroneal arteries-->med Rx w/ pletal .   Pulmonary embolism (HCC) a. 2014.   Reflux esophagitis    Type II diabetes mellitus (HCC)     Past Surgical History:  Procedure Laterality Date   BREAST BIOPSY Left ~ 2012   BUNIONECTOMY Left 1970s   CARDIAC CATHETERIZATION  04/2004   CARDIAC CATHETERIZATION N/A 02/21/2015   Procedure: Left Heart Cath and Cors/Grafts Angiography;  Surgeon: Victory LELON Sharps, MD;  Location: [facility] INVASIVE CV LAB;   Service: Cardiovascular;  Laterality: N/A;   CARDIAC CATHETERIZATION N/A 02/21/2015   Procedure: Coronary Stent Intervention;  Surgeon: Victory LELON Sharps, MD;  Location: [facility] INVASIVE CV LAB;  Service: Cardiovascular;  Laterality: N/A;   CARDIAC CATHETERIZATION N/A 02/21/2015   Procedure: Intravascular Pressure Wire/FFR Study;  Surgeon: Victory LELON Sharps, MD;  Location: [facility] INVASIVE CV LAB;  Service: Cardiovascular;  Laterality: N/A;   CARDIAC CATHETERIZATION N/A 06/20/2015   Procedure: Left Heart Cath and Cors/Grafts Angiography;  Surgeon: Ezra GORMAN Shuck, MD;  Location: [facility] INVASIVE CV LAB;  Service: Cardiovascular;  Laterality: N/A;   CARDIAC CATHETERIZATION N/A 11/19/2015   Procedure: Left Heart Cath and Cors/Grafts Angiography;  Surgeon: Peter M Swaziland, MD;  Location: [facility] INVASIVE CV LAB;  Service: Cardiovascular;  Laterality: N/A;   CARDIAC CATHETERIZATION N/A 11/19/2015   Procedure: Coronary Stent Intervention;  Surgeon: Peter M Swaziland, MD;  Location: [facility] INVASIVE CV LAB;  Service: Cardiovascular;  Laterality: N/A;   CATARACT EXTRACTION W/ INTRAOCULAR LENS  IMPLANT, BILATERAL Bilateral 1980s-1990s   CORONARY ANGIOPLASTY WITH STENT PLACEMENT  02/21/2015   1 stent   CORONARY ARTERY BYPASS GRAFT  05/2004   CABG X4   EYE SURGERY     LAPAROSCOPIC CHOLECYSTECTOMY  1990s   MASTECTOMY Left ~ 2012   PERIPHERAL VASCULAR CATHETERIZATION N/A 12/12/2014   Procedure: Abdominal Aortogram;  Surgeon: Deatrice DELENA Cage, MD;  Location: [facility] INVASIVE CV LAB;  Service: Cardiovascular;  Laterality: N/A;   RETINAL DETACHMENT SURGERY Right 1990s    Current Medications: Current Meds  Medication Sig   acetaminophen  (TYLENOL ) 500 MG tablet Take 1,000 mg by mouth every 6 (six) hours as needed for headache (pain).    ALPRAZolam  (XANAX ) 0.25 MG tablet Take 0.25 mg by mouth daily as needed for anxiety.    aspirin  81 MG tablet Take 81 mg by mouth daily.   clopidogrel  (PLAVIX ) 75 MG tablet Take 1 tablet by mouth once daily    isosorbide  mononitrate (IMDUR ) 60 MG 24 hr tablet Take 1 tablet by mouth once daily   levothyroxine  (SYNTHROID ) 75 MCG tablet Take 75 mcg by mouth daily before breakfast.   lisinopril  (ZESTRIL ) 5 MG tablet Take 1 tablet (5 mg total) by mouth daily.   nitroGLYCERIN  (NITROSTAT ) 0.4 MG SL tablet Place 1 tablet (0.4 mg total) under the tongue every 5 (five) minutes as needed for chest pain.   repaglinide (PRANDIN) 2 MG tablet Take 2 mg by mouth 2 (two) times daily before a meal.   rosuvastatin  (CRESTOR ) 20 MG tablet TAKE ONE TABLET BY MOUTH ONCE DAILY AT  6PM (Patient taking differently: Take 20 mg by mouth daily.)   sertraline  (  ZOLOFT ) 100 MG tablet Take 100 mg by mouth daily.    timolol  (BETIMOL ) 0.25 % ophthalmic solution 1-2 drops 2 (two) times daily.   [DISCONTINUED] lisinopril  (ZESTRIL ) 2.5 MG tablet Take 1 tablet (2.5 mg total) by mouth daily.     Allergies:   Niacin, Duloxetine, Amoxicillin, Ibuprofen , and Lipitor [atorvastatin ]   Social History   Socioeconomic History   Marital status: Married    Spouse name: Not on file   Number of children: Not on file   Years of education: Not on file   Highest education level: Not on file  Occupational History   Not on file  Tobacco Use   Smoking status: Former    Current packs/day: 0.00    Average packs/day: 0.5 packs/day for 15.0 years (7.5 ttl pk-yrs)    Types: Cigarettes    Start date: 05/11/1989    Quit date: 05/11/2004    Years since quitting: 19.1   Smokeless tobacco: Never  Vaping Use   Vaping status: Never Used  Substance and Sexual Activity   Alcohol use: No   Drug use: No   Sexual activity: Not Currently  Other Topics Concern   Not on file  Social History Narrative   Lives in [place] with her husband.   Social Drivers of Corporate investment banker Strain: Not on file  Food Insecurity: No Food Insecurity (04/24/2022)   Hunger Vital Sign    Worried About Running Out of Food in the Last Year: Never true    Ran Out  of Food in the Last Year: Never true  Transportation Needs: No Transportation Needs (04/24/2022)   PRAPARE - Administrator, Civil Service (Medical): No    Lack of Transportation (Non-Medical): No  Physical Activity: Not on file  Stress: Not on file  Social Connections: Not on file     Family History: The patient's family history includes Cancer in her sister; Coronary artery disease (age of onset: 60) in her sister; Coronary artery disease (age of onset: 45) in her mother; Deep vein thrombosis in her cousin; Heart attack (age of onset: 36) in her father. ROS:   Please see the history of present illness.     All other systems reviewed and are negative.  EKGs/Labs/Other Studies Reviewed:    The following studies were reviewed today:   EKG:           Recent Labs: No results found for requested labs within last 365 days.  Recent Lipid Panel    Component Value Date/Time   CHOL 86 (L) 01/27/2016 1034   TRIG 134 01/27/2016 1034   HDL 29 (L) 01/27/2016 1034   CHOLHDL 3.0 01/27/2016 1034   CHOLHDL 3.6 09/06/2015 0957   VLDL 20 09/06/2015 0957   LDLCALC 30 01/27/2016 1034    Physical Exam: Blood pressure 122/66, pulse 61, height 5' 7 (1.702 m), weight 180 lb (81.6 kg), SpO2 98%.       GEN:  Well nourished, well developed in no acute distress HEENT: Normal NECK: No JVD; No carotid bruits LYMPHATICS: No lymphadenopathy CARDIAC: RRR with frequent premature beats   RESPIRATORY:  Clear to auscultation without rales, wheezing or rhonchi  ABDOMEN: Soft, non-tender, non-distended MUSCULOSKELETAL:  No edema; No deformity  SKIN: Warm and dry NEUROLOGIC:  Alert and oriented x 3    ASSESSMENT:    1. Coronary artery disease of native artery of native heart with stable angina pectoris (HCC)   2. Essential hypertension   3. Hyperlipidemia  LDL goal <55     PLAN:       1. Coronary artery disease: .    No angina .     2. HLD:  LDL in April 15, 2023 was 53.   Cont  same meds.    3. HTN -      BP is well controlled .  Cont same meds.    4. Carotid artery disease -       5. PAD -followed by Dr. Darron No claudication   Medication Adjustments/Labs and Tests Ordered: Current medicines are reviewed at length with the patient today.  Concerns regarding medicines are outlined above.  No orders of the defined types were placed in this encounter.  Meds ordered this encounter  Medications   lisinopril  (ZESTRIL ) 5 MG tablet    Sig: Take 1 tablet (5 mg total) by mouth daily.    Dispense:  90 tablet    Refill:  3   Will have her see Glendia Ferrier, PA in a year   Signed, Aleene Passe, MD  06/29/2023 11:22 AM    [facility]

## 2023-06-29 ENCOUNTER — Encounter: Payer: Self-pay | Admitting: Cardiovascular Disease

## 2023-06-29 ENCOUNTER — Ambulatory Visit: Payer: Medicare Other | Attending: Cardiovascular Disease | Admitting: Cardiovascular Disease

## 2023-06-29 VITALS — BP 122/66 | HR 61 | Ht 67.0 in | Wt 180.0 lb

## 2023-06-29 DIAGNOSIS — I25118 Atherosclerotic heart disease of native coronary artery with other forms of angina pectoris: Secondary | ICD-10-CM | POA: Diagnosis not present

## 2023-06-29 DIAGNOSIS — I10 Essential (primary) hypertension: Secondary | ICD-10-CM | POA: Insufficient documentation

## 2023-06-29 DIAGNOSIS — E785 Hyperlipidemia, unspecified: Secondary | ICD-10-CM | POA: Insufficient documentation

## 2023-06-29 MED ORDER — LISINOPRIL 5 MG PO TABS: 5.0000 mg | ORAL_TABLET | Freq: Every day | ORAL | 3 refills | Status: DC

## 2023-06-29 NOTE — Patient Instructions (Signed)
 Medication Instructions:  CHANGE Lisinopril  to 5mg  daily *If you need a refill on your cardiac medications before your next appointment, please call your pharmacy*  Follow-Up: At [facility], you and your health needs are our priority.  As part of our continuing mission to provide you with exceptional heart care, our providers are all part of one team.  This team includes your primary Cardiologist (physician) and Advanced Practice Providers or APPs (Physician Assistants and Nurse Practitioners) who all work together to provide you with the care you need, when you need it.  Your next appointment:   1 year(s)  Provider:   Ahmad Alert, MD

## 2023-06-30 DIAGNOSIS — E785 Hyperlipidemia, unspecified: Secondary | ICD-10-CM | POA: Diagnosis not present

## 2023-06-30 DIAGNOSIS — R5383 Other fatigue: Secondary | ICD-10-CM | POA: Diagnosis not present

## 2023-06-30 DIAGNOSIS — C4361 Malignant melanoma of right upper limb, including shoulder: Secondary | ICD-10-CM | POA: Diagnosis not present

## 2023-06-30 DIAGNOSIS — Z7902 Long term (current) use of antithrombotics/antiplatelets: Secondary | ICD-10-CM | POA: Diagnosis not present

## 2023-06-30 DIAGNOSIS — K5730 Diverticulosis of large intestine without perforation or abscess without bleeding: Secondary | ICD-10-CM | POA: Diagnosis not present

## 2023-06-30 DIAGNOSIS — Z780 Asymptomatic menopausal state: Secondary | ICD-10-CM | POA: Diagnosis not present

## 2023-06-30 DIAGNOSIS — Z7982 Long term (current) use of aspirin: Secondary | ICD-10-CM | POA: Diagnosis not present

## 2023-06-30 DIAGNOSIS — Z1159 Encounter for screening for other viral diseases: Secondary | ICD-10-CM | POA: Diagnosis not present

## 2023-06-30 DIAGNOSIS — Z853 Personal history of malignant neoplasm of breast: Secondary | ICD-10-CM | POA: Diagnosis not present

## 2023-06-30 DIAGNOSIS — E039 Hypothyroidism, unspecified: Secondary | ICD-10-CM | POA: Diagnosis not present

## 2023-06-30 DIAGNOSIS — R42 Dizziness and giddiness: Secondary | ICD-10-CM | POA: Diagnosis not present

## 2023-06-30 DIAGNOSIS — I10 Essential (primary) hypertension: Secondary | ICD-10-CM | POA: Diagnosis not present

## 2023-06-30 DIAGNOSIS — Z79890 Hormone replacement therapy (postmenopausal): Secondary | ICD-10-CM | POA: Diagnosis not present

## 2023-06-30 DIAGNOSIS — I2510 Atherosclerotic heart disease of native coronary artery without angina pectoris: Secondary | ICD-10-CM | POA: Diagnosis not present

## 2023-06-30 DIAGNOSIS — N854 Malposition of uterus: Secondary | ICD-10-CM | POA: Diagnosis not present

## 2023-06-30 DIAGNOSIS — Z86711 Personal history of pulmonary embolism: Secondary | ICD-10-CM | POA: Diagnosis not present

## 2023-06-30 DIAGNOSIS — Z79899 Other long term (current) drug therapy: Secondary | ICD-10-CM | POA: Diagnosis not present

## 2023-06-30 DIAGNOSIS — N281 Cyst of kidney, acquired: Secondary | ICD-10-CM | POA: Diagnosis not present

## 2023-06-30 DIAGNOSIS — Z9181 History of falling: Secondary | ICD-10-CM | POA: Diagnosis not present

## 2023-06-30 DIAGNOSIS — F32A Depression, unspecified: Secondary | ICD-10-CM | POA: Diagnosis not present

## 2023-06-30 DIAGNOSIS — Z951 Presence of aortocoronary bypass graft: Secondary | ICD-10-CM | POA: Diagnosis not present

## 2023-06-30 DIAGNOSIS — E1151 Type 2 diabetes mellitus with diabetic peripheral angiopathy without gangrene: Secondary | ICD-10-CM | POA: Diagnosis not present

## 2023-06-30 DIAGNOSIS — F419 Anxiety disorder, unspecified: Secondary | ICD-10-CM | POA: Diagnosis not present

## 2023-06-30 DIAGNOSIS — F17210 Nicotine dependence, cigarettes, uncomplicated: Secondary | ICD-10-CM | POA: Diagnosis not present

## 2023-07-07 ENCOUNTER — Ambulatory Visit (HOSPITAL_BASED_OUTPATIENT_CLINIC_OR_DEPARTMENT_OTHER): Admitting: Orthopaedic Surgery

## 2023-07-07 DIAGNOSIS — N183 Chronic kidney disease, stage 3 unspecified: Secondary | ICD-10-CM | POA: Diagnosis not present

## 2023-07-07 DIAGNOSIS — I739 Peripheral vascular disease, unspecified: Secondary | ICD-10-CM | POA: Diagnosis not present

## 2023-07-07 DIAGNOSIS — E1142 Type 2 diabetes mellitus with diabetic polyneuropathy: Secondary | ICD-10-CM | POA: Diagnosis not present

## 2023-07-07 DIAGNOSIS — I10 Essential (primary) hypertension: Secondary | ICD-10-CM | POA: Diagnosis not present

## 2023-07-07 DIAGNOSIS — G8929 Other chronic pain: Secondary | ICD-10-CM

## 2023-07-07 DIAGNOSIS — M25511 Pain in right shoulder: Secondary | ICD-10-CM

## 2023-07-07 NOTE — Progress Notes (Signed)
 Chief Complaint: Right shoulder pain      07/07/2023: Presents today for follow-up of the right shoulder.  She did get approximately 70% relief from her injection.   History of Present Illness The patient, with a history of recurrent melanoma of the right axilla, presents with worsening right shoulder pain over the past year. The pain is severe, affecting sleep and daily activities. The patient describes the pain as being located from the shoulder to the elbow, with no radiation past the elbow. The pain is severe enough to wake the patient from sleep and requires the patient to physically lift the arm in order to move it. The patient has been managing the pain with Tylenol , which provides minimal relief. The patient has a history of a significant fall through a deck 1 year ago, resulting in hospitalization due to rib and vertebral fractures as well as a pneumothorax. The patient does not recall any other injuries to the shoulder.  She is right-hand dominant.     Surgical History:   Right axilla lymph node excisions   PMH/PSH/Family History/Social History/Meds/Allergies:         Past Medical History:  Diagnosis Date   Cancer of left breast (HCC)     Complication of anesthesia     Coronary artery disease      a. 2006 s/p CABG x 4 (LIMA->LAD, VG->Diag->OM, VG->RPDA);  b. 02/2015: [place]  s/[place] DES to [facility], VG->PDA & VG->D1->OM 100; c. 06/2015 Cath: diffuse dzs->Med rx; d. 11/2015 Cath/PCI: LM nl, LAD 100ost, 95d, D1 90ost, LCX 35ost, 65p, OM2 40, lat OM2 90, RCA 95p (2.75x16 Synergy DES), patent mid stent, RPLB 70, LIMA->LAD ok.EF55-65%.   Depression     Hx of tobacco use, presenting hazards to health      a. quit 2006.   Hypercholesteremia     Hypertension     Hypertensive heart disease      Echo 04/2019: Inferior HK, EF 55, normal RV SF, RVSP normal at 29.2, mild BAE, trivial MR, mild aortic valve sclerosis without stenosis   Left carotid bruit      a. 09/2014 Carotid U/S:  1-39% bilat ICA stenosis.   PAD (peripheral artery disease) (HCC)      a. 11/2014 ABI: R: 0.63, L 0.59;  b. 11/2014 Periph Angio: bilat pop occlusions. L - short w/ reconstituion via collats in dist pop w/ 2 vessel runoff, R long w/ reconstitution in prox tib/peroneal arteries-->med Rx w/ pletal .   Pulmonary embolism (HCC) a. 2014.   Reflux esophagitis     Type II diabetes mellitus (HCC)               Past Surgical History:  Procedure Laterality Date   BREAST BIOPSY Left ~ 2012   BUNIONECTOMY Left 1970s   CARDIAC CATHETERIZATION   04/2004   CARDIAC CATHETERIZATION N/A 02/21/2015    Procedure: Left Heart Cath and Cors/Grafts Angiography;  Surgeon: Victory LELON Sharps, MD;  Location: [facility] INVASIVE CV LAB;  Service: Cardiovascular;  Laterality: N/A;   CARDIAC CATHETERIZATION N/A 02/21/2015    Procedure: Coronary Stent Intervention;  Surgeon: Victory LELON Sharps, MD;  Location: [facility] INVASIVE CV LAB;  Service: Cardiovascular;  Laterality: N/A;   CARDIAC CATHETERIZATION N/A 02/21/2015    Procedure: Intravascular Pressure Wire/FFR Study;  Surgeon: Victory LELON Sharps, MD;  Location: [facility] INVASIVE CV LAB;  Service: Cardiovascular;  Laterality: N/A;   CARDIAC CATHETERIZATION N/A 06/20/2015    Procedure: Left Heart Cath and Cors/Grafts Angiography;  Surgeon:  Ezra GORMAN Shuck, MD;  Location: [facility] INVASIVE CV LAB;  Service: Cardiovascular;  Laterality: N/A;   CARDIAC CATHETERIZATION N/A 11/19/2015    Procedure: Left Heart Cath and Cors/Grafts Angiography;  Surgeon: Peter M Swaziland, MD;  Location: [facility] INVASIVE CV LAB;  Service: Cardiovascular;  Laterality: N/A;   CARDIAC CATHETERIZATION N/A 11/19/2015    Procedure: Coronary Stent Intervention;  Surgeon: Peter M Swaziland, MD;  Location: [facility] INVASIVE CV LAB;  Service: Cardiovascular;  Laterality: N/A;   CATARACT EXTRACTION W/ INTRAOCULAR LENS  IMPLANT, BILATERAL Bilateral 1980s-1990s   CORONARY ANGIOPLASTY WITH STENT PLACEMENT   02/21/2015    1 stent   CORONARY ARTERY BYPASS GRAFT   05/2004     CABG X4   EYE SURGERY       LAPAROSCOPIC CHOLECYSTECTOMY   1990s   MASTECTOMY Left ~ 2012   PERIPHERAL VASCULAR CATHETERIZATION N/A 12/12/2014    Procedure: Abdominal Aortogram;  Surgeon: Deatrice DELENA Cage, MD;  Location: [facility] INVASIVE CV LAB;  Service: Cardiovascular;  Laterality: N/A;   RETINAL DETACHMENT SURGERY Right 1990s        Social History         Socioeconomic History   Marital status: Married      Spouse name: Not on file   Number of children: Not on file   Years of education: Not on file   Highest education level: Not on file  Occupational History   Not on file  Tobacco Use   Smoking status: Former      Current packs/day: 0.00      Average packs/day: 0.5 packs/day for 15.0 years (7.5 ttl pk-yrs)      Types: Cigarettes      Start date: 05/11/1989      Quit date: 05/11/2004      Years since quitting: 18.9   Smokeless tobacco: Never  Vaping Use   Vaping status: Never Used  Substance and Sexual Activity   Alcohol use: No   Drug use: No   Sexual activity: Not Currently  Other Topics Concern   Not on file  Social History Narrative    Lives in [place] with her husband.    Social Drivers of Acupuncturist Strain: Not on file  Food Insecurity: No Food Insecurity (04/24/2022)    Hunger Vital Sign     Worried About Running Out of Food in the Last Year: Never true     Ran Out of Food in the Last Year: Never true  Transportation Needs: No Transportation Needs (04/24/2022)    PRAPARE - Therapist, art (Medical): No     Lack of Transportation (Non-Medical): No  Physical Activity: Not on file  Stress: Not on file  Social Connections: Not on file         Family History  Problem Relation Age of Onset   Coronary artery disease Mother 44        status post CABG    Heart attack Father 28        deceased secondary to fatal  first myocardial infarction   Coronary artery disease Sister 72        alive had bypass surgery    Cancer Sister     Deep vein thrombosis Cousin          Neice had after knee surgery        Allergies       Allergies  Allergen Reactions   Niacin  Other (See Comments)      burning   Duloxetine        Other reaction(s): Other (See Comments)   Amoxicillin Rash   Ibuprofen  Rash   Lipitor [Atorvastatin ] Other (See Comments)      Muscle pain            Current Outpatient Medications  Medication Sig Dispense Refill   acetaminophen  (TYLENOL ) 500 MG tablet Take 1,000 mg by mouth every 6 (six) hours as needed for headache (pain).        ALPRAZolam  (XANAX ) 0.25 MG tablet Take 0.25 mg by mouth daily as needed for anxiety.        aspirin  81 MG tablet Take 81 mg by mouth daily.       clopidogrel  (PLAVIX ) 75 MG tablet Take 1 tablet by mouth once daily 90 tablet 3   isosorbide  mononitrate (IMDUR ) 60 MG 24 hr tablet Take 1 tablet by mouth once daily 90 tablet 3   levothyroxine  (SYNTHROID ) 75 MCG tablet Take 75 mcg by mouth daily before breakfast.       lisinopril  (ZESTRIL ) 2.5 MG tablet Take 1 tablet (2.5 mg total) by mouth daily. 90 tablet 3   nitroGLYCERIN  (NITROSTAT ) 0.4 MG SL tablet Place 1 tablet (0.4 mg total) under the tongue every 5 (five) minutes as needed for chest pain. 25 tablet 3   oxyCODONE  (OXY IR/ROXICODONE ) 5 MG immediate release tablet Take 1 tablet (5 mg total) by mouth 2 (two) times daily as needed for moderate pain or severe pain (2.5mg  for moderate pain, 5mg  for severe pain). 10 tablet 0   repaglinide (PRANDIN) 2 MG tablet Take 2 mg by mouth 2 (two) times daily before a meal.       rosuvastatin  (CRESTOR ) 20 MG tablet TAKE ONE TABLET BY MOUTH ONCE DAILY AT  6PM (Patient taking differently: Take 20 mg by mouth daily.) 90 tablet 3   sertraline  (ZOLOFT ) 100 MG tablet Take 100 mg by mouth daily.        timolol  (BETIMOL ) 0.25 % ophthalmic solution 1-2 drops 2 (two) times daily.          No current facility-administered medications for this visit.              Facility-Administered Medications Ordered in Other Visits  Medication Dose Route Frequency Provider Last Rate Last Admin   aminophylline  injection 75 mg  75 mg Intravenous BID PRN McLean, Dalton S, MD   75 mg at 08/08/14 1335      Imaging Results (Last 48 hours)  No results found.     Review of Systems:   A ROS was performed including pertinent positives and negatives as documented in the HPI.   Physical Exam :   Constitutional: NAD and appears stated age Neurological: Alert and oriented Psych: Appropriate affect and cooperative There were no vitals taken for this visit.    Comprehensive Musculoskeletal Exam:     Tenderness in the right shoulder over the anterior glenohumeral joint, AC joint, and lateral deltoid.  Active right shoulder range of motion to 130 degrees flexion, 20 degrees ER, IR to back pocket, and abduction to 80 degrees.  Contralateral shoulder motion to 160 flexion, 30 ER, and IR to L5.  Positive empty can and Neer/Hawkins with negative drop arm test.  Distal neurosensory exam intact.   Imaging:   Xray (right humerus 2 views): Moderate glenohumeral and AC joint osteoarthritis with spurring noted medially of the humeral head.   MRI right shoulder: Thickness  rotator cuff tear with inferior full-thickness cartilage loss about the glenohumeral joint   I personally reviewed and interpreted the radiographs.          Assessment & Plan 78 year old female with right shoulder pain in the setting rotator cuff tear as well as arthritis consistent with rotator cuff arthropathy.  At this time she did get very good relief approximately 70% from her injection.  At this time she is hoping to defer any type of surgical treatment.  I will plan to perform an injection as needed           I personally saw and evaluated the patient, and participated in the management and treatment plan.

## 2023-07-12 DIAGNOSIS — I10 Essential (primary) hypertension: Secondary | ICD-10-CM | POA: Diagnosis not present

## 2023-07-12 DIAGNOSIS — E7800 Pure hypercholesterolemia, unspecified: Secondary | ICD-10-CM | POA: Diagnosis not present

## 2023-07-12 DIAGNOSIS — I739 Peripheral vascular disease, unspecified: Secondary | ICD-10-CM | POA: Diagnosis not present

## 2023-07-12 DIAGNOSIS — E1142 Type 2 diabetes mellitus with diabetic polyneuropathy: Secondary | ICD-10-CM | POA: Diagnosis not present

## 2023-07-12 DIAGNOSIS — N183 Chronic kidney disease, stage 3 unspecified: Secondary | ICD-10-CM | POA: Diagnosis not present

## 2023-07-12 DIAGNOSIS — E039 Hypothyroidism, unspecified: Secondary | ICD-10-CM | POA: Diagnosis not present

## 2023-07-12 DIAGNOSIS — E113292 Type 2 diabetes mellitus with mild nonproliferative diabetic retinopathy without macular edema, left eye: Secondary | ICD-10-CM | POA: Diagnosis not present

## 2023-07-15 DIAGNOSIS — H31093 Other chorioretinal scars, bilateral: Secondary | ICD-10-CM | POA: Diagnosis not present

## 2023-07-15 DIAGNOSIS — D3132 Benign neoplasm of left choroid: Secondary | ICD-10-CM | POA: Diagnosis not present

## 2023-07-15 DIAGNOSIS — H1045 Other chronic allergic conjunctivitis: Secondary | ICD-10-CM | POA: Diagnosis not present

## 2023-07-15 DIAGNOSIS — H40013 Open angle with borderline findings, low risk, bilateral: Secondary | ICD-10-CM | POA: Diagnosis not present

## 2023-07-15 DIAGNOSIS — H04123 Dry eye syndrome of bilateral lacrimal glands: Secondary | ICD-10-CM | POA: Diagnosis not present

## 2023-07-15 DIAGNOSIS — Z961 Presence of intraocular lens: Secondary | ICD-10-CM | POA: Diagnosis not present

## 2023-07-15 DIAGNOSIS — E113551 Type 2 diabetes mellitus with stable proliferative diabetic retinopathy, right eye: Secondary | ICD-10-CM | POA: Diagnosis not present

## 2023-07-28 ENCOUNTER — Other Ambulatory Visit: Payer: Self-pay | Admitting: Nurse Practitioner

## 2023-07-30 ENCOUNTER — Encounter (INDEPENDENT_AMBULATORY_CARE_PROVIDER_SITE_OTHER): Payer: Medicare Other | Admitting: Ophthalmology

## 2023-07-30 DIAGNOSIS — H43813 Vitreous degeneration, bilateral: Secondary | ICD-10-CM

## 2023-07-30 DIAGNOSIS — Z7984 Long term (current) use of oral hypoglycemic drugs: Secondary | ICD-10-CM

## 2023-07-30 DIAGNOSIS — E113591 Type 2 diabetes mellitus with proliferative diabetic retinopathy without macular edema, right eye: Secondary | ICD-10-CM | POA: Diagnosis not present

## 2023-07-30 DIAGNOSIS — H338 Other retinal detachments: Secondary | ICD-10-CM

## 2023-07-30 DIAGNOSIS — I10 Essential (primary) hypertension: Secondary | ICD-10-CM | POA: Diagnosis not present

## 2023-07-30 DIAGNOSIS — D3132 Benign neoplasm of left choroid: Secondary | ICD-10-CM

## 2023-07-30 DIAGNOSIS — H35033 Hypertensive retinopathy, bilateral: Secondary | ICD-10-CM | POA: Diagnosis not present

## 2023-07-30 DIAGNOSIS — E113292 Type 2 diabetes mellitus with mild nonproliferative diabetic retinopathy without macular edema, left eye: Secondary | ICD-10-CM

## 2023-08-06 DIAGNOSIS — I10 Essential (primary) hypertension: Secondary | ICD-10-CM | POA: Diagnosis not present

## 2023-08-06 DIAGNOSIS — I739 Peripheral vascular disease, unspecified: Secondary | ICD-10-CM | POA: Diagnosis not present

## 2023-08-06 DIAGNOSIS — E1142 Type 2 diabetes mellitus with diabetic polyneuropathy: Secondary | ICD-10-CM | POA: Diagnosis not present

## 2023-08-06 DIAGNOSIS — N183 Chronic kidney disease, stage 3 unspecified: Secondary | ICD-10-CM | POA: Diagnosis not present

## 2023-08-12 DIAGNOSIS — I10 Essential (primary) hypertension: Secondary | ICD-10-CM | POA: Diagnosis not present

## 2023-08-12 DIAGNOSIS — E113292 Type 2 diabetes mellitus with mild nonproliferative diabetic retinopathy without macular edema, left eye: Secondary | ICD-10-CM | POA: Diagnosis not present

## 2023-08-12 DIAGNOSIS — N183 Chronic kidney disease, stage 3 unspecified: Secondary | ICD-10-CM | POA: Diagnosis not present

## 2023-08-12 DIAGNOSIS — E1142 Type 2 diabetes mellitus with diabetic polyneuropathy: Secondary | ICD-10-CM | POA: Diagnosis not present

## 2023-08-12 DIAGNOSIS — E7800 Pure hypercholesterolemia, unspecified: Secondary | ICD-10-CM | POA: Diagnosis not present

## 2023-08-12 DIAGNOSIS — I739 Peripheral vascular disease, unspecified: Secondary | ICD-10-CM | POA: Diagnosis not present

## 2023-08-12 DIAGNOSIS — E039 Hypothyroidism, unspecified: Secondary | ICD-10-CM | POA: Diagnosis not present

## 2023-08-19 DIAGNOSIS — E113292 Type 2 diabetes mellitus with mild nonproliferative diabetic retinopathy without macular edema, left eye: Secondary | ICD-10-CM | POA: Diagnosis not present

## 2023-08-19 DIAGNOSIS — I890 Lymphedema, not elsewhere classified: Secondary | ICD-10-CM | POA: Diagnosis not present

## 2023-08-19 DIAGNOSIS — Z6828 Body mass index (BMI) 28.0-28.9, adult: Secondary | ICD-10-CM | POA: Diagnosis not present

## 2023-08-19 DIAGNOSIS — F419 Anxiety disorder, unspecified: Secondary | ICD-10-CM | POA: Diagnosis not present

## 2023-08-19 DIAGNOSIS — L0390 Cellulitis, unspecified: Secondary | ICD-10-CM | POA: Diagnosis not present

## 2023-08-19 DIAGNOSIS — H6122 Impacted cerumen, left ear: Secondary | ICD-10-CM | POA: Diagnosis not present

## 2023-08-19 DIAGNOSIS — I10 Essential (primary) hypertension: Secondary | ICD-10-CM | POA: Diagnosis not present

## 2023-09-12 DIAGNOSIS — N183 Chronic kidney disease, stage 3 unspecified: Secondary | ICD-10-CM | POA: Diagnosis not present

## 2023-09-12 DIAGNOSIS — E039 Hypothyroidism, unspecified: Secondary | ICD-10-CM | POA: Diagnosis not present

## 2023-09-12 DIAGNOSIS — I10 Essential (primary) hypertension: Secondary | ICD-10-CM | POA: Diagnosis not present

## 2023-09-12 DIAGNOSIS — E113292 Type 2 diabetes mellitus with mild nonproliferative diabetic retinopathy without macular edema, left eye: Secondary | ICD-10-CM | POA: Diagnosis not present

## 2023-09-12 DIAGNOSIS — E7800 Pure hypercholesterolemia, unspecified: Secondary | ICD-10-CM | POA: Diagnosis not present

## 2023-09-12 DIAGNOSIS — I739 Peripheral vascular disease, unspecified: Secondary | ICD-10-CM | POA: Diagnosis not present

## 2023-09-12 DIAGNOSIS — E1142 Type 2 diabetes mellitus with diabetic polyneuropathy: Secondary | ICD-10-CM | POA: Diagnosis not present

## 2023-09-15 ENCOUNTER — Telehealth: Payer: Self-pay | Admitting: Nurse Practitioner

## 2023-09-15 DIAGNOSIS — Z888 Allergy status to other drugs, medicaments and biological substances status: Secondary | ICD-10-CM | POA: Diagnosis not present

## 2023-09-15 NOTE — Telephone Encounter (Signed)
 Spoke with pt regarding Rosaline Jetty suggestions. Pt was told not to take Lisinopril . Lisinopril  will be added to the pt's allergy list. Pt will be seeing Dr. Shlomo tomorrow 9/4 in clinic. Pt stated her swelling has improved and that she is not having any issues breathing. Pt was given ED precautions. Pt verbalized understanding. All questions if any were answered.

## 2023-09-15 NOTE — Telephone Encounter (Signed)
 She should stop lisinopril  and see DOD at appointed time. Of note, I do not see record of increased lisinopril  dose, but it should be marked as an allergy and an alternative agent can be recommended by Dr. Shlomo at appointment.

## 2023-09-15 NOTE — Telephone Encounter (Signed)
 Pt c/o medication issue:  1. Name of Medication: lisinopril  (ZESTRIL ) 5 MG tablet [[id number]]   2. How are you currently taking this medication (dosage and times per day)? 20mg    3. Are you having a reaction (difficulty breathing--STAT)? Made her face swell , they week to the urgent care this morning and the Dr stated they though she was allergic to this med.  She was treated for it at the urgent care.  They told her to stop taking it so they are wanting a call back from the nurse     4. What is your medication issue? Allergic   Best number [phone]

## 2023-09-15 NOTE — Telephone Encounter (Signed)
 Patient stated she thinks she had an allergic reaction to Lisinopril  today. Patient stated that Dr. Okey increased her lisinopril  to 20 mg from 5 mg. Patient stated she took increase dose for a week, then had reaction. Patient stated she had some swollen in her face and lips. Patient stated she went to urgent care today and they gave her a steroid shot, and advised her to follow-up with her cardiologist.  Patient stated she has also had angina for about a month, that happens a couple times a week, and last several hours. Patient described as a pressure or push feeling. Patient stated she did take her nitroglycerin  3 days ago, and it helped. Patient stated her last chest pain was last night and it lasted for 30 min. Last readings - BP 159/76 HR 60. Patient also stated that she has a tennis ball sized spot on the inside of her right elbow that is red, and she is concerned. Will send message to pharmacy to advise on lisinopril  reaction. Patient scheduled with DOD tomorrow. Will also send message to Rosaline Bane NP.

## 2023-09-16 ENCOUNTER — Encounter: Payer: Self-pay | Admitting: Cardiology

## 2023-09-16 ENCOUNTER — Ambulatory Visit: Attending: Cardiology | Admitting: Cardiology

## 2023-09-16 ENCOUNTER — Other Ambulatory Visit: Payer: Self-pay | Admitting: Nurse Practitioner

## 2023-09-16 VITALS — BP 126/62 | HR 69 | Ht 67.0 in | Wt 190.4 lb

## 2023-09-16 DIAGNOSIS — I82621 Acute embolism and thrombosis of deep veins of right upper extremity: Secondary | ICD-10-CM | POA: Insufficient documentation

## 2023-09-16 DIAGNOSIS — G4733 Obstructive sleep apnea (adult) (pediatric): Secondary | ICD-10-CM | POA: Diagnosis not present

## 2023-09-16 DIAGNOSIS — Z79899 Other long term (current) drug therapy: Secondary | ICD-10-CM | POA: Insufficient documentation

## 2023-09-16 DIAGNOSIS — R079 Chest pain, unspecified: Secondary | ICD-10-CM | POA: Diagnosis not present

## 2023-09-16 DIAGNOSIS — I10 Essential (primary) hypertension: Secondary | ICD-10-CM | POA: Insufficient documentation

## 2023-09-16 LAB — TROPONIN I (HIGH SENSITIVITY): Troponin I (High Sensitivity): 14 ng/L (ref <18)

## 2023-09-16 LAB — D-DIMER, QUANTITATIVE: D-Dimer, Quant: 1.62 ug{FEU}/mL — ABNORMAL HIGH (ref 0.00–0.50)

## 2023-09-16 MED ORDER — CLOPIDOGREL BISULFATE 75 MG PO TABS: 75.0000 mg | ORAL_TABLET | Freq: Every day | ORAL | 2 refills | Status: AC

## 2023-09-16 MED ORDER — ISOSORBIDE MONONITRATE ER 30 MG PO TB24: 30.0000 mg | ORAL_TABLET | Freq: Every day | ORAL | 3 refills | Status: DC

## 2023-09-16 MED ORDER — FUROSEMIDE 20 MG PO TABS: 20.0000 mg | ORAL_TABLET | Freq: Every day | ORAL | 3 refills | Status: AC | PRN

## 2023-09-16 NOTE — Progress Notes (Addendum)
 Date:  09/16/2023   ID:  Taylor [name], DOB 1945/02/08, MRN [id number]  PatPCP:  Okey Carlin Redbird, MD  Cardiologist: Manus Passe, MD Sleep Medicine:  Wilbert Bihari, MD Electrophysiologist:  None   Chief Complaint:  OSA  History of Present Illness:    Taylor [name] is a 78 y.o. female with a hx of moderate OSA with an AHI of 16.3/hr, loud snoring and oxygen desaturations as low as 83%.  She underwent PAP titration but could not be adequately titrated on PAP and is now on auto BIPAP.   She is doing well with her PAP device and thinks that she has gotten used to it.  She tolerates the full face mask and feels the pressure is adequate.  She has never felt rested when she gets up in the am and still gets sleepy during the day and has to nap despite BiPAP.   She denies any significant nasal dryness or nasal congestion.  She has had problems with mouth dryness.  She tried adjusting humidity but then went too far and had water in the tubing. She does not think that she snores. An Epworth Sleepiness Scale score was calculated the office today and this endorsed at 8 arguing against residual daytime sleepiness. Patient denies any episodes of bruxism, restless legs, No gagging hallucinations or cataplectic events.    She has had some LE edema recently. Intermittently.  She has tried to cut back on her Na intake. She eats out once weekly.  She does not take a diuretic.  She has had some problems with pressure in the midsternal portion of her chest and if she massages the chest wall it improves.  The pain is nonexertional and when she is laying down.  There is no radiation of the pain except one time she had jaw pain.  There are no associated sx of nausea or diaphoresis. She denies any SOB unless she walks too much which is not a new sx and has been going on for years.  Prior CV studies:   The following studies were reviewed today:  PAP compliance download  Past Medical History:  Diagnosis Date    Cancer of left breast (HCC)    Complication of anesthesia    Coronary artery disease    a. 2006 s/p CABG x 4 (LIMA->LAD, VG->Diag->OM, VG->RPDA);  b. 02/2015: [place]  s/p DES to [facility], VG->PDA & VG->D1->OM 100; c. 06/2015 Cath: diffuse dzs->Med rx; d. 11/2015 Cath/PCI: LM nl, LAD 100ost, 95d, D1 90ost, LCX 35ost, 65p, OM2 40, lat OM2 90, RCA 95p (2.75x16 Synergy DES), patent mid stent, RPLB 70, LIMA->LAD ok.EF55-65%.   Depression    Hx of tobacco use, presenting hazards to health    a. quit 2006.   Hypercholesteremia    Hypertension    Hypertensive heart disease    Echo 04/2019: Inferior HK, EF 55, normal RV SF, RVSP normal at 29.2, mild BAE, trivial MR, mild aortic valve sclerosis without stenosis   Left carotid bruit    a. 09/2014 Carotid U/S: 1-39% bilat ICA stenosis.   PAD (peripheral artery disease) (HCC)    a. 11/2014 ABI: R: 0.63, L 0.59;  b. 11/2014 Periph Angio: bilat pop occlusions. L - short w/ reconstituion via collats in dist pop w/ 2 vessel runoff, R long w/ reconstitution in prox tib/peroneal arteries-->med Rx w/ pletal .   Pulmonary embolism (HCC) a. 2014.   Reflux esophagitis    Type II diabetes mellitus (HCC)    Past Surgical History:  Procedure Laterality Date   BREAST BIOPSY Left ~ 2012   BUNIONECTOMY Left 1970s   CARDIAC CATHETERIZATION  04/2004   CARDIAC CATHETERIZATION N/A 02/21/2015   Procedure: Left Heart Cath and Cors/Grafts Angiography;  Surgeon: Victory LELON Sharps, MD;  Location: [facility] INVASIVE CV LAB;  Service: Cardiovascular;  Laterality: N/A;   CARDIAC CATHETERIZATION N/A 02/21/2015   Procedure: Coronary Stent Intervention;  Surgeon: Victory LELON Sharps, MD;  Location: [facility] INVASIVE CV LAB;  Service: Cardiovascular;  Laterality: N/A;   CARDIAC CATHETERIZATION N/A 02/21/2015   Procedure: Intravascular Pressure Wire/FFR Study;  Surgeon: Victory LELON Sharps, MD;  Location: [facility] INVASIVE CV LAB;  Service: Cardiovascular;  Laterality: N/A;   CARDIAC CATHETERIZATION N/A 06/20/2015   Procedure: Left  Heart Cath and Cors/Grafts Angiography;  Surgeon: Ezra GORMAN Shuck, MD;  Location: [facility] INVASIVE CV LAB;  Service: Cardiovascular;  Laterality: N/A;   CARDIAC CATHETERIZATION N/A 11/19/2015   Procedure: Left Heart Cath and Cors/Grafts Angiography;  Surgeon: Peter M Swaziland, MD;  Location: [facility] INVASIVE CV LAB;  Service: Cardiovascular;  Laterality: N/A;   CARDIAC CATHETERIZATION N/A 11/19/2015   Procedure: Coronary Stent Intervention;  Surgeon: Peter M Swaziland, MD;  Location: [facility] INVASIVE CV LAB;  Service: Cardiovascular;  Laterality: N/A;   CATARACT EXTRACTION W/ INTRAOCULAR LENS  IMPLANT, BILATERAL Bilateral 1980s-1990s   CORONARY ANGIOPLASTY WITH STENT PLACEMENT  02/21/2015   1 stent   CORONARY ARTERY BYPASS GRAFT  05/2004   CABG X4   EYE SURGERY     LAPAROSCOPIC CHOLECYSTECTOMY  1990s   MASTECTOMY Left ~ 2012   PERIPHERAL VASCULAR CATHETERIZATION N/A 12/12/2014   Procedure: Abdominal Aortogram;  Surgeon: Deatrice DELENA Cage, MD;  Location: [facility] INVASIVE CV LAB;  Service: Cardiovascular;  Laterality: N/A;   RETINAL DETACHMENT SURGERY Right 1990s     Current Meds  Medication Sig   acetaminophen  (TYLENOL ) 500 MG tablet Take 1,000 mg by mouth every 6 (six) hours as needed for headache (pain).    ALPRAZolam  (XANAX ) 0.25 MG tablet Take 0.25 mg by mouth daily as needed for anxiety.    aspirin  81 MG tablet Take 81 mg by mouth daily.   buPROPion (WELLBUTRIN XL) 150 MG 24 hr tablet Take 150 mg by mouth daily.   clopidogrel  (PLAVIX ) 75 MG tablet Take 1 tablet (75 mg total) by mouth daily.   isosorbide  mononitrate (IMDUR ) 60 MG 24 hr tablet Take 1 tablet by mouth once daily   levothyroxine  (SYNTHROID ) 75 MCG tablet Take 75 mcg by mouth daily before breakfast.   nitroGLYCERIN  (NITROSTAT ) 0.4 MG SL tablet Place 1 tablet (0.4 mg total) under the tongue every 5 (five) minutes as needed for chest pain.   pioglitazone (ACTOS) 45 MG tablet Take 45 mg by mouth daily.   repaglinide (PRANDIN) 2 MG tablet Take 2 mg by  mouth 2 (two) times daily before a meal.   rosuvastatin  (CRESTOR ) 40 MG tablet Take 40 mg by mouth daily.   sertraline  (ZOLOFT ) 100 MG tablet Take 100 mg by mouth daily.    timolol  (BETIMOL ) 0.25 % ophthalmic solution 1-2 drops 2 (two) times daily.     Allergies:   Niacin, Duloxetine, Lisinopril , Amoxicillin, Ibuprofen , and Lipitor [atorvastatin ]   Social History   Tobacco Use   Smoking status: Former    Current packs/day: 0.00    Average packs/day: 0.5 packs/day for 15.0 years (7.5 ttl pk-yrs)    Types: Cigarettes    Start date: 05/11/1989    Quit date: 05/11/2004    Years since quitting: 19.3  Smokeless tobacco: Never  Vaping Use   Vaping status: Never Used  Substance Use Topics   Alcohol use: No   Drug use: No     Family Hx: The patient's family history includes Cancer in her sister; Coronary artery disease (age of onset: 41) in her sister; Coronary artery disease (age of onset: 57) in her mother; Deep vein thrombosis in her cousin; Heart attack (age of onset: 49) in her father.  ROS:   Please see the history of present illness.     All other systems reviewed and are negative.   Labs/Other Tests and Data Reviewed:    Recent Labs: No results found for requested labs within last 365 days.   Recent Lipid Panel Lab Results  Component Value Date/Time   CHOL 86 (L) 01/27/2016 10:34 AM   TRIG 134 01/27/2016 10:34 AM   HDL 29 (L) 01/27/2016 10:34 AM   CHOLHDL 3.0 01/27/2016 10:34 AM   CHOLHDL 3.6 09/06/2015 09:57 AM   LDLCALC 30 01/27/2016 10:34 AM    Wt Readings from Last 3 Encounters:  09/16/23 190 lb 6.4 oz (86.4 kg)  06/29/23 180 lb (81.6 kg)  12/31/22 183 lb 12.8 oz (83.4 kg)    EKG Interpretation Date/Time:  Thursday September 16 2023 16:37:50 EDT Ventricular Rate:  69 PR Interval:  164 QRS Duration:  92 QT Interval:  438 QTC Calculation: 469 R Axis:   -18  Text Interpretation: Sinus rhythm with occasional Premature ventricular complexes  and PACs  Incomplete right bundle branch block When compared with ECG of 31-Dec-2022 14:34, Premature ventricular complexes are now Present Left anterior fascicular block is no longer Present Confirmed by Shlomo Corning ([id number]) on 09/16/2023 4:44:47 PM    Objective:    Vital Signs:  BP 126/62   Pulse 69   Ht 5' 7 (1.702 m)   Wt 190 lb 6.4 oz (86.4 kg)   SpO2 97%   BMI 29.82 kg/m   GEN: Well nourished, well developed in no acute distress HEENT: Normal NECK: No JVD; left carotid bruit LYMPHATICS: No lymphadenopathy CARDIAC:RRR, no murmurs, rubs, gallops RESPIRATORY:  Clear to auscultation without rales, wheezing or rhonchi  ABDOMEN: Soft, non-tender, non-distended MUSCULOSKELETAL:  trace edema; No deformity RUE swelling with erythema? lymphedema SKIN: Warm and dry NEUROLOGIC:  Alert and oriented x 3 PSYCHIATRIC:  Normal affect  ASSESSMENT & PLAN:    OSA - The patient is tolerating PAP therapy well without any problems. The PAP download performed by his DME was personally reviewed and interpreted by me today and showed an AHI of 4.9/hr on auto BiPAP with IPAP max 18cm H2O, EPAP min 4cm H2O and PS 4cm H2O cm H2O with 97% compliance in using more than 4 hours nightly.  The patient has been using and benefiting from PAP use and will continue to benefit from therapy.   HTN - BP controlled on exam today  Chest pain ASCAD HLD -s/p remote CABG in 2008 -cath 11/2015 that revealed patent LIMA-LAD, DES to mid RCA (overlapping with prior stent), otherwise stable disease. At that time had occasional chest tightness when resting at night.   -current pain is atypical in description and mainly occurs at rest and improves with rubbing her chest wall.  She did have one episode associated with jaw pain>>she tells me that the chest pain is similar to what she had when she had her cath and NTG does help -her DOE only occurs if she walks too much and has been going on for  years -her last stress test was 5 years  ago -I will get a Lexiscan  myoview  to rule out ischemia -Informed Consent   Shared Decision Making/Informed Consent The risks [chest pain, shortness of breath, cardiac arrhythmias, dizziness, blood pressure fluctuations, myocardial infarction, stroke/transient ischemic attack, nausea, vomiting, allergic reaction, radiation exposure, metallic taste sensation and life-threatening complications (estimated to be 1 in 10,000)], benefits (risk stratification, diagnosing coronary artery disease, treatment guidance) and alternatives of a nuclear stress test were discussed in detail with Ms. Holsapple and she agrees to proceed. -continue ASA 81mg  daily, Plavix  75mg  daily, Crestor  40mg  daily -increase Imdur  to 90mg  daily -check hs Troponin/ddimer stat -LDL goal < 55 -I have personally reviewed and interpreted outside labs performed by patient's PCP which showed LDL 53, HDL 33 on 04/15/2023 -continue Crestor  40mg  daily with PRN refills  PAD -known bilateral popliteal artery occlusion no ideal for intervention -mild carotid artery stenosis -Sees Dr. Arida>>tried Cilostazol  with no improved in claudication sx>>her claudication is stable -continue ASA, Plavix  and statin  LE edema -likely dietary indiscretion with Na -encouraged her to limit in Na intake -encourage to keep legs elevated when sitting -gave her a Rx for compression hose -will give her Rx for Lasix  20mg  daily PRN for LE edema  RUE edema -she has swelling in the dependent portion of her upper arm with erythema that was Rx'd with antibx a month ago with no change -? Lymphedema -will check a RUE venous doppler to rule out DVT  Followup with PA in 4 weeks     Medication Adjustments/Labs and Tests Ordered: Current medicines are reviewed at length with the patient today.  Concerns regarding medicines are outlined above.  Tests Ordered: Orders Placed This Encounter  Procedures   EKG 12-Lead   Medication Changes: No orders of the defined  types were placed in this encounter.   Disposition:  Follow up in 1 year(s)  Signed, Wilbert Bihari, MD  09/16/2023 4:46 PM    [facility]

## 2023-09-16 NOTE — Addendum Note (Signed)
 Addended by: JANIT GENI CROME on: 09/16/2023 05:26 PM   Modules accepted: Orders

## 2023-09-16 NOTE — Patient Instructions (Signed)
 Medication Instructions:  Please START taking lasix  20 mg daily as needed for leg edema.  Please INCREASE your Imdur  to 90 mg daily.  *If you need a refill on your cardiac medications before your next appointment, please call your pharmacy*  Lab Work: Please complete a high-sensitivity troponin and a D-Dimer today at the [facility] Admitting Lab.  If you have labs (blood work) drawn today and your tests are completely normal, you will receive your results only by: MyChart Message (if you have MyChart) OR A paper copy in the mail If you have any lab test that is abnormal or we need to change your treatment, we will call you to review the results.  Testing/Procedures: Your physician has requested that you have a right upper extremity venous duplex. This test is an ultrasound of the veins in the legs or arms. It looks at venous blood flow that carries blood from the heart to the legs or arms. Allow one hour for a Lower Venous exam. Allow thirty minutes for an Upper Venous exam. There are no restrictions or special instructions.  Please note: We ask at that you not bring children with you during ultrasound (echo/ vascular) testing. Due to room size and safety concerns, children are not allowed in the ultrasound rooms during exams. Our front office staff cannot provide observation of children in our lobby area while testing is being conducted. An adult accompanying a patient to their appointment will only be allowed in the ultrasound room at the discretion of the ultrasound technician under special circumstances. We apologize for any inconvenience.   You are scheduled for a Myocardial Perfusion Imaging Study on:  _______ at _______.  Please arrive 15 minutes prior to your appointment time for registration and insurance purposes.  The test will take approximately 3 to 4 hours to complete; you may bring reading material.  If someone comes with you to your appointment, they will need to remain in the  main lobby due to limited space in the testing area. **If you are pregnant or breastfeeding, please notify the nuclear lab prior to your appointment**  How to prepare for your Myocardial Perfusion Test: Do not eat or drink 3 hours prior to your test, except you may have water. Do not consume products containing caffeine (regular or decaffeinated) 12 hours prior to your test. (ex: coffee, chocolate, sodas, tea). Do bring a list of your current medications with you.  If not listed below, you may take your medications as normal. Do wear comfortable clothes (no dresses or overalls) and walking shoes, tennis shoes preferred (No heels or open toe shoes are allowed). Do NOT wear cologne, perfume, aftershave, or lotions (deodorant is allowed). If these instructions are not followed, your test will have to be rescheduled.  Please report to [address] ([facility] 2nd Floor, for your test.  If you have questions or concerns about your appointment, you can call the Nuclear Lab at [phone].  If you cannot keep your appointment, please provide 24 hours notification to the Nuclear Lab, to avoid a possible $50 charge to your account.   Follow-Up: At [facility], you and your health needs are our priority.  As part of our continuing mission to provide you with exceptional heart care, our providers are all part of one team.  This team includes your primary Cardiologist (physician) and Advanced Practice Providers or APPs (Physician Assistants and Nurse Practitioners) who all work together to  provide you with the care you need, when you need it.  Your next appointment:   4 week(s)  Provider:   One of our Advanced Practice Providers (APPs): Morse Clause, PA-C  Lamarr Satterfield, NP Miriam Shams, NP  Olivia Pavy, PA-C Josefa Beauvais, NP  Leontine Salen, PA-C Orren Fabry, PA-C  [place], PA-C Ernest Dick, NP  Damien Braver, NP Jon Hails,  PA-C  Waddell Donath, PA-C    Dayna Dunn, PA-C  Scott Weaver, PA-C Lum Louis, NP Katlyn West, NP Callie Goodrich, PA-C  Evan Williams, PA-C Sheng Haley, PA-C  Xika Zhao, NP Kathleen Johnson, PA-C    We recommend signing up for the patient portal called MyChart.  Sign up information is provided on this After Visit Summary.  MyChart is used to connect with patients for Virtual Visits (Telemedicine).  Patients are able to view lab/test results, encounter notes, upcoming appointments, etc.  Non-urgent messages can be sent to your provider as well.   To learn more about what you can do with MyChart, go to [email or web address].   Other Instructions Please make an appointment with your PCP to discuss evaluation of your right arm swelling.

## 2023-09-17 ENCOUNTER — Telehealth: Payer: Self-pay | Admitting: Cardiovascular Disease

## 2023-09-17 NOTE — Telephone Encounter (Signed)
 Called pt spouse ok per DPR.  Spouse reports pt had lip and face swelling 2 days ago.  Went to Urgent Care was advised it could be r/t to PCP increasing Lisinopril  from 2.5 mg to 20 mg every day.  Medication was stopped. Pt had an OV with Dr. Shlomo 09/16/23 per spouse was told not to worry about BP until lab results come back. They have not heard from our office; would like to know what pt should do now.   Asked pt if contacted PCP to notify of events.  Expressed are waiting on a call back.  Advised once provider has reviewed lab results our office will reach out with next steps.   Pt reports BP today is 157/67-62  Advised to continue to monitor BP and keep a log for review. Will send to Dr. Shlomo to review.

## 2023-09-17 NOTE — Telephone Encounter (Signed)
 Spoke with pt advised of Dr. Dorine recommendations: Lets see what her BP runs over the weekend - please have her check her BP twice daily for a week and call with results   Pt expresses understanding all questions answered.

## 2023-09-17 NOTE — Telephone Encounter (Signed)
 Pt c/o medication issue:  1. Name of Medication:   lisinopril  (ZESTRIL ) 2.5 MG tablet   2. How are you currently taking this medication (dosage and times per day)?   3. Are you having a reaction (difficulty breathing--STAT)? Swollen lips and face  4. What is your medication issue?    Husband wants a call back to discuss next steps regarding patient's reaction to this medication.

## 2023-09-17 NOTE — Addendum Note (Signed)
 Addended by: SHLOMO WILBERT SAUNDERS on: 09/17/2023 08:24 AM   Modules accepted: Orders

## 2023-09-20 ENCOUNTER — Ambulatory Visit

## 2023-09-20 ENCOUNTER — Encounter (HOSPITAL_COMMUNITY): Payer: Self-pay | Admitting: Cardiology

## 2023-09-21 ENCOUNTER — Encounter (HOSPITAL_COMMUNITY): Payer: Self-pay | Admitting: *Deleted

## 2023-09-21 ENCOUNTER — Encounter: Payer: Self-pay | Admitting: Cardiology

## 2023-09-23 ENCOUNTER — Ambulatory Visit: Payer: Self-pay | Admitting: Cardiology

## 2023-09-23 ENCOUNTER — Ambulatory Visit (HOSPITAL_COMMUNITY)
Admission: RE | Admit: 2023-09-23 | Discharge: 2023-09-23 | Disposition: A | Discharge status: Home / Self Care | Source: Ambulatory Visit | Attending: Cardiology | Admitting: Cardiology

## 2023-09-23 DIAGNOSIS — I82621 Acute embolism and thrombosis of deep veins of right upper extremity: Secondary | ICD-10-CM | POA: Diagnosis not present

## 2023-09-28 ENCOUNTER — Other Ambulatory Visit: Payer: Self-pay | Admitting: Cardiology

## 2023-09-28 DIAGNOSIS — R079 Chest pain, unspecified: Secondary | ICD-10-CM

## 2023-09-30 ENCOUNTER — Ambulatory Visit (HOSPITAL_COMMUNITY)
Admission: RE | Admit: 2023-09-30 | Discharge: 2023-09-30 | Disposition: A | Discharge status: Home / Self Care | Source: Ambulatory Visit | Attending: Internal Medicine | Admitting: Internal Medicine

## 2023-09-30 DIAGNOSIS — R079 Chest pain, unspecified: Secondary | ICD-10-CM | POA: Insufficient documentation

## 2023-09-30 MED ORDER — REGADENOSON 0.4 MG/5ML IV SOLN
0.4000 mg | Freq: Once | INTRAVENOUS | Status: AC
  Administered 2023-09-30: 0.4 mg via INTRAVENOUS

## 2023-09-30 MED ORDER — REGADENOSON 0.4 MG/5ML IV SOLN
INTRAVENOUS | Status: AC
  Filled 2023-09-30: qty 5

## 2023-09-30 MED ORDER — TECHNETIUM TC 99M TETROFOSMIN IV KIT
10.0000 | PACK | Freq: Once | INTRAVENOUS | Status: AC | PRN
  Administered 2023-09-30: 10 via INTRAVENOUS

## 2023-09-30 MED ORDER — TECHNETIUM TC 99M TETROFOSMIN IV KIT
31.5000 | PACK | Freq: Once | INTRAVENOUS | Status: AC | PRN
  Administered 2023-09-30: 31.5 via INTRAVENOUS

## 2023-10-01 LAB — MYOCARDIAL PERFUSION IMAGING
LV dias vol: 94 mL (ref 46–106)
LV sys vol: 36 mL (ref 3.8–5.2)
Nuc Stress EF: 62 %
Peak HR: 78 {beats}/min
Rest HR: 61 {beats}/min
Rest Nuclear Isotope Dose: 10 mCi
SDS: 1
SRS: 2
SSS: 1
ST Depression (mm): 0 mm
Stress Nuclear Isotope Dose: 31.5 mCi
TID: 0.89

## 2023-10-03 ENCOUNTER — Ambulatory Visit: Payer: Self-pay | Admitting: Cardiology

## 2023-10-09 DIAGNOSIS — E1142 Type 2 diabetes mellitus with diabetic polyneuropathy: Secondary | ICD-10-CM | POA: Diagnosis not present

## 2023-10-09 DIAGNOSIS — I739 Peripheral vascular disease, unspecified: Secondary | ICD-10-CM | POA: Diagnosis not present

## 2023-10-09 DIAGNOSIS — N183 Chronic kidney disease, stage 3 unspecified: Secondary | ICD-10-CM | POA: Diagnosis not present

## 2023-10-09 DIAGNOSIS — I10 Essential (primary) hypertension: Secondary | ICD-10-CM | POA: Diagnosis not present

## 2023-10-11 DIAGNOSIS — Z79899 Other long term (current) drug therapy: Secondary | ICD-10-CM | POA: Diagnosis not present

## 2023-10-11 DIAGNOSIS — C4361 Malignant melanoma of right upper limb, including shoulder: Secondary | ICD-10-CM | POA: Diagnosis not present

## 2023-10-11 DIAGNOSIS — Z1159 Encounter for screening for other viral diseases: Secondary | ICD-10-CM | POA: Diagnosis not present

## 2023-10-12 DIAGNOSIS — N183 Chronic kidney disease, stage 3 unspecified: Secondary | ICD-10-CM | POA: Diagnosis not present

## 2023-10-12 DIAGNOSIS — E113292 Type 2 diabetes mellitus with mild nonproliferative diabetic retinopathy without macular edema, left eye: Secondary | ICD-10-CM | POA: Diagnosis not present

## 2023-10-12 DIAGNOSIS — I739 Peripheral vascular disease, unspecified: Secondary | ICD-10-CM | POA: Diagnosis not present

## 2023-10-12 DIAGNOSIS — E1142 Type 2 diabetes mellitus with diabetic polyneuropathy: Secondary | ICD-10-CM | POA: Diagnosis not present

## 2023-10-12 DIAGNOSIS — E7800 Pure hypercholesterolemia, unspecified: Secondary | ICD-10-CM | POA: Diagnosis not present

## 2023-10-12 DIAGNOSIS — E039 Hypothyroidism, unspecified: Secondary | ICD-10-CM | POA: Diagnosis not present

## 2023-10-12 DIAGNOSIS — I10 Essential (primary) hypertension: Secondary | ICD-10-CM | POA: Diagnosis not present

## 2023-10-20 DIAGNOSIS — D225 Melanocytic nevi of trunk: Secondary | ICD-10-CM | POA: Diagnosis not present

## 2023-10-20 DIAGNOSIS — Z08 Encounter for follow-up examination after completed treatment for malignant neoplasm: Secondary | ICD-10-CM | POA: Diagnosis not present

## 2023-10-20 DIAGNOSIS — Z85820 Personal history of malignant melanoma of skin: Secondary | ICD-10-CM | POA: Diagnosis not present

## 2023-10-20 DIAGNOSIS — Z1283 Encounter for screening for malignant neoplasm of skin: Secondary | ICD-10-CM | POA: Diagnosis not present

## 2023-10-21 ENCOUNTER — Encounter: Payer: Self-pay | Admitting: Physician Assistant

## 2023-10-21 ENCOUNTER — Ambulatory Visit: Attending: Physician Assistant | Admitting: Physician Assistant

## 2023-10-21 VITALS — BP 128/58 | HR 70 | Ht 67.0 in | Wt 191.4 lb

## 2023-10-21 DIAGNOSIS — I25118 Atherosclerotic heart disease of native coronary artery with other forms of angina pectoris: Secondary | ICD-10-CM | POA: Diagnosis not present

## 2023-10-21 DIAGNOSIS — E782 Mixed hyperlipidemia: Secondary | ICD-10-CM | POA: Diagnosis not present

## 2023-10-21 DIAGNOSIS — G4733 Obstructive sleep apnea (adult) (pediatric): Secondary | ICD-10-CM | POA: Insufficient documentation

## 2023-10-21 DIAGNOSIS — I6523 Occlusion and stenosis of bilateral carotid arteries: Secondary | ICD-10-CM | POA: Diagnosis not present

## 2023-10-21 DIAGNOSIS — I739 Peripheral vascular disease, unspecified: Secondary | ICD-10-CM | POA: Insufficient documentation

## 2023-10-21 DIAGNOSIS — Z79899 Other long term (current) drug therapy: Secondary | ICD-10-CM | POA: Insufficient documentation

## 2023-10-21 DIAGNOSIS — E785 Hyperlipidemia, unspecified: Secondary | ICD-10-CM | POA: Diagnosis not present

## 2023-10-21 NOTE — Progress Notes (Signed)
 Cardiology Office Note   Date:  10/21/2023  ID:  Taylor [name], DOB 04-21-1945, MRN [id number] PCP: Okey Carlin Redbird, MD  [facility] Providers Cardiologist:  Madonna Large, DO Sleep Medicine:  Wilbert Bihari, MD   History of Present Illness Taylor [name] is a 78 y.o. female with a past medical history of moderate OSA with a AHI of 16.3/h, loud snoring and oxygen saturations as well as 83% who follows Dr. Bihari for sleep apnea here for follow-up appointment.  Other medical history includes coronary artery disease status post bypass grafting in 2006, PCI in 2015 and 2018 peripheral vascular disease, pulmonary embolus, history of breast cancer, hypertension, hyperlipidemia, carotid artery disease, and diabetes mellitus.  She been followed by Dr. Alveta since 2019.  Back then was having some DOE and her walking was limited due to claudication.  Lipids have been managed by her primary medical doctor and it typically looked good.  Triglycerides were elevated at 215.  Hemoglobin A1c was 8.1.  Her diet was not ideal with eating a lot of chocolate back in 2022.  Dietary discretion was advised.  Triglycerides remained elevated and she had been trying to reduce her intake of carbohydrates and chocolate.  She continues to struggle with DOE back in June 2025 when she was last seen and her walking was limited by her last fall.  She ended up getting a stress test 10/03/2023 and that looked good without any ischemic changes and no ST deviation  Today, she presents with a hx of coronary artery disease for cardiovascular follow-up.  She underwent bypass surgery in 2006 and stent placements in 2015 and 2018. She takes Plavix , aspirin , and isosorbide  mononitrate 90 mg, with no significant change in symptoms. She denies recent chest pain, dyspnea, edema, or palpitations and has not used nitroglycerin  recently.  She has peripheral artery disease with two blockages behind her knees that could not be stented.  She experiences leg pain and throbbing, especially when walking. No ABI test has been performed since 2019.  She has mild carotid artery disease, last evaluated in 2024, with mild narrowing and no recent symptom changes.  She takes irbesartan 150 mg daily for blood pressure control. She had an allergic reaction to lisinopril , causing facial swelling.  Her cholesterol management includes Crestor  40 mg at bedtime. Her LDL is 53, but triglycerides are elevated at 206. Her A1c is also elevated.  D-dimer was ordered 6 weeks ago and she is worried that it was slightly elevated.  We discussed that this is a nonspecific test.  We will be checking her legs anyway for arterial disease and we will also ultrasound.  She does not have any unilateral lower extremity swelling.  Does have bilateral claudication today.  Reports no shortness of breath nor dyspnea on exertion. Reports no chest pain, pressure, or tightness. No orthopnea, PND. Reports no palpitations.   Discussed the use of AI scribe software for clinical note transcription with the patient, who gave verbal consent to proceed.  ROS: Pertinent ROS in HPI  Studies Reviewed      Stress test 09/30/2023     The study is normal. The study is low risk.   No ST deviation was noted.   LV perfusion is normal. There is no evidence of ischemia. There is no evidence of infarction.   Left ventricular function is normal. Nuclear stress EF: 62%. The left ventricular ejection fraction is normal (55-65%). End diastolic cavity size is normal. End systolic cavity size is  normal. No evidence of transient ischemic dilation (TID) noted.   CT images were obtained for attenuation correction and were examined for the presence of coronary calcium  when appropriate.   Coronary calcium  assessment not performed due to prior revascularization.   Prior study available for comparison from 01/04/2019. No changes compared to prior study.      Physical Exam VS:  BP (!) 128/58    Pulse 70   Ht 5' 7 (1.702 m)   Wt 191 lb 6.4 oz (86.8 kg)   SpO2 99%   BMI 29.98 kg/m        Wt Readings from Last 3 Encounters:  10/21/23 191 lb 6.4 oz (86.8 kg)  09/16/23 190 lb 6.4 oz (86.4 kg)  06/29/23 180 lb (81.6 kg)    GEN: Well nourished, well developed in no acute distress NECK: No JVD; No carotid bruits CARDIAC: RRR, no murmurs, rubs, gallops RESPIRATORY:  Clear to auscultation without rales, wheezing or rhonchi  ABDOMEN: Soft, non-tender, non-distended EXTREMITIES:  No edema; No deformity   ASSESSMENT AND PLAN  Atherosclerotic heart disease with angina and prior CABG/stents No ischemic changes on stress test. No current symptoms. Blood pressure controlled. LDL at 53, triglycerides elevated at 206, likely due to elevated A1c. - Continue Plavix  and aspirin . - Continue isosorbide  mononitrate 90 mg. - Discuss with primary care physician to recheck lipid panel and LFTs at next visit. - Establish care with Dr. Michele for cardiology follow-up in 6 months.  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 Symptoms persist, especially with walking. - Order ABI to assess current status of peripheral artery disease. -Order LE US  - Refer to Dr. Darron for further evaluation and management.  Carotid artery stenosis, bilateral, mild Mild bilateral carotid artery stenosis noted on last year's evaluation. No need for re-evaluation at this time. - Re-evaluate carotid artery stenosis in a couple of years unless symptoms change.  HTN -Irbesartan was recently added to her medication regimen -Blood pressure is better controlled now -Continue to track blood pressure at home and continue low-sodium, heart healthy diet      Dispo: She can follow-up in 6 months with Dr. Michele   Signed, Orren LOISE Fabry, PA-C

## 2023-10-21 NOTE — Patient Instructions (Addendum)
 Medication Instructions:   Your physician recommends that you continue on your current medications as directed. Please refer to the Current Medication list given to you today.   *If you need a refill on your cardiac medications before your next appointment, please call your pharmacy*    Lab Work: MAKE SURE YOUR  PRIMARY PROVIDER  IN NOVEMBER DRAW  LFT AND LIPIDS        If you have labs (blood work) drawn today and your tests are completely normal, you will receive your results only by: MyChart Message (if you have MyChart) OR A paper copy in the mail If you have any lab test that is abnormal or we need to change your treatment, we will call you to review the results.    Testing/Procedures: Your physician has requested that you have an ankle brachial index (ABI). During this test an ultrasound and blood pressure cuff are used to evaluate the arteries that supply the arms and legs with blood. Allow thirty minutes for this exam. There are no restrictions or special instructions.  Please note: We ask at that you not bring children with you during ultrasound (echo/ vascular) testing. Due to room size and safety concerns, children are not allowed in the ultrasound rooms during exams. Our front office staff cannot provide observation of children in our lobby area while testing is being conducted. An adult accompanying a patient to their appointment will only be allowed in the ultrasound room at the discretion of the ultrasound technician under special circumstances. We apologize for any inconvenience.    Your physician has requested that you have a lower extremity arterial exercise duplex. During this test, exercise and ultrasound are used to evaluate arterial blood flow in the legs. Allow one hour for this exam. There are no restrictions or special instructions.   Follow-Up: At [facility], you and your health needs are our priority.  As part of our continuing mission to provide you  with exceptional heart care, our providers are all part of one team.  This team includes your primary Cardiologist (physician) and Advanced Practice Providers or APPs (Physician Assistants and Nurse Practitioners) who all work together to provide you with the care you need, when you need it.   Your next appointment:   Dr Darron  in 2  MONTHS    6 month(s)  Provider:  Dr. Michele     We recommend signing up for the patient portal called MyChart.  Sign up information is provided on this After Visit Summary.  MyChart is used to connect with patients for Virtual Visits (Telemedicine).  Patients are able to view lab/test results, encounter notes, upcoming appointments, etc.  Non-urgent messages can be sent to your provider as well.   To learn more about what you can do with MyChart, go to [email or web address].   Other Instructions    Low-Sodium Eating Plan Salt (sodium) helps you keep a healthy balance of fluids in your body. Too much sodium can raise your blood pressure. It can also cause fluid and waste to be held in your body. Your health care provider or dietitian may recommend a low-sodium eating plan if you have high blood pressure (hypertension), kidney disease, liver disease, or heart failure. Eating less sodium can help lower your blood pressure and reduce swelling. It can also protect your heart, liver, and kidneys. What are tips for following this plan? Reading food labels  Check food labels for the amount of sodium per serving. If you eat more  than one serving, you must multiply the listed amount by the number of servings. Choose foods with less than 140 milligrams (mg) of sodium per serving. Avoid foods with 300 mg of sodium or more per serving. Always check how much sodium is in a product, even if the label says unsalted or no salt added. Shopping  Buy products labeled as low-sodium or no salt added. Buy fresh foods. Avoid canned foods and pre-made or frozen  meals. Avoid canned, cured, or processed meats. Buy breads that have less than 80 mg of sodium per slice. Cooking  Eat more home-cooked food. Try to eat less restaurant, buffet, and fast food. Try not to add salt when you cook. Use salt-free seasonings or herbs instead of table salt or sea salt. Check with your provider or pharmacist before using salt substitutes. Cook with plant-based oils, such as canola, sunflower, or olive oil. Meal planning When eating at a restaurant, ask if your food can be made with less salt or no salt. Avoid dishes labeled as brined, pickled, cured, or smoked. Avoid dishes made with soy sauce, miso, or teriyaki sauce. Avoid foods that have monosodium glutamate (MSG) in them. MSG may be added to some restaurant food, sauces, soups, bouillon, and canned foods. Make meals that can be grilled, baked, poached, roasted, or steamed. These are often made with less sodium. General information Try to limit your sodium intake to 1,500-2,300 mg each day, or the amount told by your provider. What foods should I eat? Fruits Fresh, frozen, or canned fruit. Fruit juice. Vegetables Fresh or frozen vegetables. No salt added canned vegetables. No salt added tomato sauce and paste. Low-sodium or reduced-sodium tomato and vegetable juice. Grains Low-sodium cereals, such as oats, puffed wheat and rice, and shredded wheat. Low-sodium crackers. Unsalted rice. Unsalted pasta. Low-sodium bread. Whole grain breads and whole grain pasta. Meats and other proteins Fresh or frozen meat, poultry, seafood, and fish. These should have no added salt. Low-sodium canned tuna and salmon. Unsalted nuts. Dried peas, beans, and lentils without added salt. Unsalted canned beans. Eggs. Unsalted nut butters. Dairy Milk. Soy milk. Cheese that is naturally low in sodium, such as ricotta cheese, fresh mozzarella, or Swiss cheese. Low-sodium or reduced-sodium cheese. Cream cheese. Yogurt. Seasonings and  condiments Fresh and dried herbs and spices. Salt-free seasonings. Low-sodium mustard and ketchup. Sodium-free salad dressing. Sodium-free light mayonnaise. Fresh or refrigerated horseradish. Lemon juice. Vinegar. Other foods Homemade, reduced-sodium, or low-sodium soups. Unsalted popcorn and pretzels. Low-salt or salt-free chips. The items listed above may not be all the foods and drinks you can have. Talk to a dietitian to learn more. What foods should I avoid? Vegetables Sauerkraut, pickled vegetables, and relishes. Olives. [place] fries. Onion rings. Regular canned vegetables, except low-sodium or reduced-sodium items. Regular canned tomato sauce and paste. Regular tomato and vegetable juice. Frozen vegetables in sauces. Grains Instant hot cereals. Bread stuffing, pancake, and biscuit mixes. Croutons. Seasoned rice or pasta mixes. Noodle soup cups. Boxed or frozen macaroni and cheese. Regular salted crackers. Self-rising flour. Meats and other proteins Meat or fish that is salted, canned, smoked, spiced, or pickled. Precooked or cured meat, such as sausages or meat loaves. [place]. Ham. Pepperoni. Hot dogs. Corned beef. Chipped beef. Salt pork. Jerky. Pickled herring, anchovies, and sardines. Regular canned tuna. Salted nuts. Dairy Processed cheese and cheese spreads. Hard cheeses. Cheese curds. Blue cheese. Feta cheese. String cheese. Regular cottage cheese. Buttermilk. Canned milk. Fats and oils Salted butter. Regular margarine. Ghee. Bacon fat. Seasonings  and condiments Onion salt, garlic salt, seasoned salt, table salt, and sea salt. Canned and packaged gravies. Worcestershire sauce. Tartar sauce. Barbecue sauce. Teriyaki sauce. Soy sauce, including reduced-sodium soy sauce. Steak sauce. Fish sauce. Oyster sauce. Cocktail sauce. Horseradish that you find on the shelf. Regular ketchup and mustard. Meat flavorings and tenderizers. Bouillon cubes. Hot sauce. Pre-made or packaged marinades.  Pre-made or packaged taco seasonings. Relishes. Regular salad dressings. Salsa. Other foods Salted popcorn and pretzels. Corn chips and puffs. Potato and tortilla chips. Canned or dried soups. Pizza. Frozen entrees and pot pies. The items listed above may not be all the foods and drinks you should avoid. Talk to a dietitian to learn more. This information is not intended to replace advice given to you by your health care provider. Make sure you discuss any questions you have with your health care provider. Document Revised: 01/15/2022 Document Reviewed: 01/15/2022 Elsevier Patient Education  2024 [employer]     Heart-Healthy Eating Plan Eating a healthy diet is important for the health of your heart. A heart-healthy eating plan includes: Eating less unhealthy fats. Eating more healthy fats. Eating less salt in your food. Salt is also called sodium. Making other changes in your diet. Talk with your doctor or a diet specialist (dietitian) to create an eating plan that is right for you. What is my plan? Your doctor may recommend an eating plan that includes: Total fat: ______% or less of total calories a day. Saturated fat: ______% or less of total calories a day. Cholesterol: less than _________mg a day. Sodium: less than _________mg a day. What are tips for following this plan? Cooking Avoid frying your food. Try to bake, boil, grill, or broil it instead. You can also reduce fat by: Removing the skin from poultry. Removing all visible fats from meats. Steaming vegetables in water or broth. Meal planning  At meals, divide your plate into four equal parts: Fill one-half of your plate with vegetables and green salads. Fill one-fourth of your plate with whole grains. Fill one-fourth of your plate with lean protein foods. Eat 2-4 cups of vegetables per day. One cup of vegetables is: 1 cup (91 g) broccoli or cauliflower florets. 2 medium carrots. 1 large bell pepper. 1 large sweet  potato. 1 large tomato. 1 medium white potato. 2 cups (150 g) raw leafy greens. Eat 1-2 cups of fruit per day. One cup of fruit is: 1 small apple 1 large banana 1 cup (237 g) mixed fruit, 1 large orange,  cup (82 g) dried fruit, 1 cup (240 mL) 100% fruit juice. Eat more foods that have soluble fiber. These are apples, broccoli, carrots, beans, peas, and barley. Try to get 20-30 g of fiber per day. Eat 4-5 servings of nuts, legumes, and seeds per week: 1 serving of dried beans or legumes equals  cup (90 g) cooked. 1 serving of nuts is  oz (12 almonds, 24 pistachios, or 7 walnut halves). 1 serving of seeds equals  oz (8 g). General information Eat more home-cooked food. Eat less restaurant, buffet, and fast food. Limit or avoid alcohol. Limit foods that are high in starch and sugar. Avoid fried foods. Lose weight if you are overweight. Keep track of how much salt (sodium) you eat. This is important if you have high blood pressure. Ask your doctor to tell you more about this. Try to add vegetarian meals each week. Fats Choose healthy fats. These include olive oil and canola oil, flaxseeds, walnuts, almonds, and seeds.  Eat more omega-3 fats. These include salmon, mackerel, sardines, tuna, flaxseed oil, and ground flaxseeds. Try to eat fish at least 2 times each week. Check food labels. Avoid foods with trans fats or high amounts of saturated fat. Limit saturated fats. These are often found in animal products, such as meats, butter, and cream. These are also found in plant foods, such as palm oil, palm kernel oil, and coconut oil. Avoid foods with partially hydrogenated oils in them. These have trans fats. Examples are stick margarine, some tub margarines, cookies, crackers, and other baked goods. What foods should I eat? Fruits All fresh, canned (in natural juice), or frozen fruits. Vegetables Fresh or frozen vegetables (raw, steamed, roasted, or grilled). Green  salads. Grains Most grains. Choose whole wheat and whole grains most of the time. Rice and pasta, including brown rice and pastas made with whole wheat. Meats and other proteins Lean, well-trimmed beef, veal, pork, and lamb. Chicken and [place] without skin. All fish and shellfish. Wild duck, rabbit, pheasant, and venison. Egg whites or low-cholesterol egg substitutes. Dried beans, peas, lentils, and tofu. Seeds and most nuts. Dairy Low-fat or nonfat cheeses, including ricotta and mozzarella. Skim or 1% milk that is liquid, powdered, or evaporated. Buttermilk that is made with low-fat milk. Nonfat or low-fat yogurt. Fats and oils Non-hydrogenated (trans-free) margarines. Vegetable oils, including soybean, sesame, sunflower, olive, peanut, safflower, corn, canola, and cottonseed. Salad dressings or mayonnaise made with a vegetable oil. Beverages Mineral water. Coffee and tea. Diet carbonated beverages. Sweets and desserts Sherbet, gelatin, and fruit ice. Small amounts of dark chocolate. Limit all sweets and desserts. Seasonings and condiments All seasonings and condiments. The items listed above may not be a complete list of foods and drinks you can eat. Contact a dietitian for more options. What foods should I avoid? Fruits Canned fruit in heavy syrup. Fruit in cream or butter sauce. Fried fruit. Limit coconut. Vegetables Vegetables cooked in cheese, cream, or butter sauce. Fried vegetables. Grains Breads that are made with saturated or trans fats, oils, or whole milk. Croissants. Sweet rolls. Donuts. High-fat crackers, such as cheese crackers. Meats and other proteins Fatty meats, such as hot dogs, ribs, sausage, bacon, rib-eye roast or steak. High-fat deli meats, such as salami and bologna. Caviar. Domestic duck and goose. Organ meats, such as liver. Dairy Cream, sour cream, cream cheese, and creamed cottage cheese. Whole-milk cheeses. Whole or 2% milk that is liquid, evaporated, or  condensed. Whole buttermilk. Cream sauce or high-fat cheese sauce. Yogurt that is made from whole milk. Fats and oils Meat fat, or shortening. Cocoa butter, hydrogenated oils, palm oil, coconut oil, palm kernel oil. Solid fats and shortenings, including bacon fat, salt pork, lard, and butter. Nondairy cream substitutes. Salad dressings with cheese or sour cream. Beverages Regular sodas and juice drinks with added sugar. Sweets and desserts Frosting. Pudding. Cookies. Cakes. Pies. Milk chocolate or white chocolate. Buttered syrups. Full-fat ice cream or ice cream drinks. The items listed above may not be a complete list of foods and drinks to avoid. Contact a dietitian for more information. Summary Heart-healthy meal planning includes eating less unhealthy fats, eating more healthy fats, and making other changes in your diet. Eat a balanced diet. This includes fruits and vegetables, low-fat or nonfat dairy, lean protein, nuts and legumes, whole grains, and heart-healthy oils and fats. This information is not intended to replace advice given to you by your health care provider. Make sure you discuss any questions you have with your  health care provider. Document Revised: 02/03/2021 Document Reviewed: 02/03/2021 Elsevier Patient Education  2024 [employer].

## 2023-10-26 ENCOUNTER — Ambulatory Visit: Attending: Nurse Practitioner | Admitting: Physical Therapy

## 2023-10-26 ENCOUNTER — Other Ambulatory Visit: Payer: Self-pay

## 2023-10-26 ENCOUNTER — Encounter: Payer: Self-pay | Admitting: Physical Therapy

## 2023-10-26 DIAGNOSIS — M25612 Stiffness of left shoulder, not elsewhere classified: Secondary | ICD-10-CM | POA: Diagnosis not present

## 2023-10-26 DIAGNOSIS — C4361 Malignant melanoma of right upper limb, including shoulder: Secondary | ICD-10-CM | POA: Diagnosis not present

## 2023-10-26 DIAGNOSIS — I972 Postmastectomy lymphedema syndrome: Secondary | ICD-10-CM | POA: Diagnosis not present

## 2023-10-26 DIAGNOSIS — R293 Abnormal posture: Secondary | ICD-10-CM | POA: Insufficient documentation

## 2023-10-26 DIAGNOSIS — I890 Lymphedema, not elsewhere classified: Secondary | ICD-10-CM | POA: Insufficient documentation

## 2023-10-26 DIAGNOSIS — M25611 Stiffness of right shoulder, not elsewhere classified: Secondary | ICD-10-CM | POA: Insufficient documentation

## 2023-10-26 DIAGNOSIS — M79605 Pain in left leg: Secondary | ICD-10-CM | POA: Insufficient documentation

## 2023-10-26 NOTE — Therapy (Signed)
 OUTPATIENT PHYSICAL THERAPY  UPPER EXTREMITY ONCOLOGY EVALUATION  Patient Name: Taylor [name] MRN: [id number] DOB:16-Nov-1945, 78 y.o., female Today's Date: 10/26/2023  END OF SESSION:  PT End of Session - 10/26/23 1250     Visit Number 1    Number of Visits 19    Date for Recertification  12/07/23    PT Start Time 1204    PT Stop Time 1255    PT Time Calculation (min) 51 min    Activity Tolerance Patient tolerated treatment well    Behavior During Therapy [facility] for tasks assessed/performed          Past Medical History:  Diagnosis Date   Cancer of left breast (HCC)    Complication of anesthesia    Coronary artery disease    a. 2006 s/p CABG x 4 (LIMA->LAD, VG->Diag->OM, VG->RPDA);  b. 02/2015: [place]  s/p DES to [facility], VG->PDA & VG->D1->OM 100; c. 06/2015 Cath: diffuse dzs->Med rx; d. 11/2015 Cath/PCI: LM nl, LAD 100ost, 95d, D1 90ost, LCX 35ost, 65p, OM2 40, lat OM2 90, RCA 95p (2.75x16 Synergy DES), patent mid stent, RPLB 70, LIMA->LAD ok.EF55-65%.   Depression    Hx of tobacco use, presenting hazards to health    a. quit 2006.   Hypercholesteremia    Hypertension    Hypertensive heart disease    Echo 04/2019: Inferior HK, EF 55, normal RV SF, RVSP normal at 29.2, mild BAE, trivial MR, mild aortic valve sclerosis without stenosis   Left carotid bruit    a. 09/2014 Carotid U/S: 1-39% bilat ICA stenosis.   PAD (peripheral artery disease)    a. 11/2014 ABI: R: 0.63, L 0.59;  b. 11/2014 Periph Angio: bilat pop occlusions. L - short w/ reconstituion via collats in dist pop w/ 2 vessel runoff, R long w/ reconstitution in prox tib/peroneal arteries-->med Rx w/ pletal .   Pulmonary embolism (HCC) a. 2014.   Reflux esophagitis    Type II diabetes mellitus (HCC)    Past Surgical History:  Procedure Laterality Date   BREAST BIOPSY Left ~ 2012   BUNIONECTOMY Left 1970s   CARDIAC CATHETERIZATION  04/2004   CARDIAC CATHETERIZATION N/A 02/21/2015   Procedure: Left Heart Cath and Cors/Grafts  Angiography;  Surgeon: Victory LELON Sharps, MD;  Location: [facility] INVASIVE CV LAB;  Service: Cardiovascular;  Laterality: N/A;   CARDIAC CATHETERIZATION N/A 02/21/2015   Procedure: Coronary Stent Intervention;  Surgeon: Victory LELON Sharps, MD;  Location: [facility] INVASIVE CV LAB;  Service: Cardiovascular;  Laterality: N/A;   CARDIAC CATHETERIZATION N/A 02/21/2015   Procedure: Intravascular Pressure Wire/FFR Study;  Surgeon: Victory LELON Sharps, MD;  Location: [facility] INVASIVE CV LAB;  Service: Cardiovascular;  Laterality: N/A;   CARDIAC CATHETERIZATION N/A 06/20/2015   Procedure: Left Heart Cath and Cors/Grafts Angiography;  Surgeon: Ezra GORMAN Shuck, MD;  Location: [facility] INVASIVE CV LAB;  Service: Cardiovascular;  Laterality: N/A;   CARDIAC CATHETERIZATION N/A 11/19/2015   Procedure: Left Heart Cath and Cors/Grafts Angiography;  Surgeon: Peter M Swaziland, MD;  Location: [facility] INVASIVE CV LAB;  Service: Cardiovascular;  Laterality: N/A;   CARDIAC CATHETERIZATION N/A 11/19/2015   Procedure: Coronary Stent Intervention;  Surgeon: Peter M Swaziland, MD;  Location: [facility] INVASIVE CV LAB;  Service: Cardiovascular;  Laterality: N/A;   CATARACT EXTRACTION W/ INTRAOCULAR LENS  IMPLANT, BILATERAL Bilateral 1980s-1990s   CORONARY ANGIOPLASTY WITH STENT PLACEMENT  02/21/2015   1 stent   CORONARY ARTERY BYPASS GRAFT  05/2004   CABG X4   EYE SURGERY  LAPAROSCOPIC CHOLECYSTECTOMY  1990s   MASTECTOMY Left ~ 2012   PERIPHERAL VASCULAR CATHETERIZATION N/A 12/12/2014   Procedure: Abdominal Aortogram;  Surgeon: Deatrice DELENA Cage, MD;  Location: [facility] INVASIVE CV LAB;  Service: Cardiovascular;  Laterality: N/A;   RETINAL DETACHMENT SURGERY Right 1990s   Patient Active Problem List   Diagnosis Date Noted   Trauma 05/01/2022   Rib fractures 04/24/2022   Pain due to onychomycosis of toenails of both feet 04/09/2021   Diabetic neuropathy (HCC) 04/09/2021   Type 2 diabetes mellitus with vascular disease (HCC) 04/09/2021   Obesity (BMI 30-39.9) 01/16/2018   Mixed  hyperlipidemia 09/21/2016   Carotid artery disease 12/03/2015   OSA (obstructive sleep apnea) 12/03/2015   Pain and swelling of left lower leg 11/17/2015   Coronary artery disease    Hypertensive heart disease    Type II diabetes mellitus (HCC)    PAD (peripheral artery disease) 11/03/2014   Chest pain 08/06/2014   Nausea with vomiting 05/08/2014   Allergic reaction 05/08/2014   Hypercholesteremia    Hypertension    Diabetes mellitus (HCC)    Hx of tobacco use, presenting hazards to health 12/21/2013   Varicose veins of left lower extremity 12/18/2013   Cardiac enzymes elevated 03/01/2012   Chronic diastolic heart failure, NYHA class 1 (HCC) 03/01/2012   Bilateral pulmonary embolism (HCC) 02/29/2012   History of breast cancer, DCIS, Left mastectomy 03/21/2009. 03/06/2011   Chest pain 11/30/2010   Left carotid bruit 02/26/2009   CHRONIC LEFT LEG VENOUS INSUFFICIENCY 07/02/2008   EDEMA 05/14/2008   HYPERCHOLESTEROLEMIA 05/10/2008   CAD, ARTERY BYPASS GRAFT 05/10/2008   ESOPHAGITIS, REFLUX 05/10/2008   DM (diabetes mellitus) type I uncontrolled, periph vascular disorder (HCC) 10/18/2006   DEPRESSION 10/18/2006   Essential hypertension 10/18/2006    PCP: Carlin Dale Gull, MD  REFERRING PROVIDER: Darra Macintosh, NP  REFERRING DIAG: C43.61 (ICD-10-CM) - Malignant melanoma of right upper limb, including shoulder I89.0 (ICD-10-CM) - Lymphedema, not elsewhere classified  THERAPY DIAG:  Lymphedema, not elsewhere classified  Post-mastectomy lymphedema syndrome  Stiffness of left shoulder, not elsewhere classified  Stiffness of right shoulder, not elsewhere classified  Pain in left leg  Abnormal posture  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 My shirts no longer fit this arm. They were very tight. I did not know what was going on. I had not gained any weight until very recently.   PERTINENT HISTORY: History of melanoma as follows: - 12/26/19 presented with a lesion on R posterior upper arm to dermatology s/p shave biopsy with path showing lentiginous melanoma at least 2.36mm deep, ulcerated, 02/06/20 s/p WLE and SLB with residual melanoma in situ at the excision, margins free, 0/2 R axillary LNs negative for melanoma. Staged as pT3bN0. - 12/10/20 shave biopsy of R forearm showed superficial spreading melanoma, at least 1.19mm thick, margins extended to base. - 01/28/21 excision of a R radial distal forearm melanoma with no residual melanoma. Non-migratory SLN. Staged as pT2aNX. BRAF negative again. - 08/19/21 presented for routine follow-up with a 2cm R axillary LN was palpated.  - 08/28/21 R axillary core needle biopsy with path revealing metastatic melanoma at least 0.2cm, non-brisk TILs, positive margins, focal necrosis present. LAG3 highlights about 10% of cells. - 09/01/21 PET-CT showed 3cm R axillary LN with subcentimeter RUL lung nodule - 09/12/21 MRI brain negative for metastatic disease- 09/18/21-02/11/22 neoadjuvant pembrolizumab  - 11/11/21 PET-CT after 4 cycles showed response in R axilla but still persistent disease, new rectal thickening and uptake- 02/11/22 PET-CT showed a near complete response to NAJ therapy with resolution of disease in R axilla, still a bit of rectal thickening compared to prior - 03/05/22 limited R axillary LN dissection with path revealing 1.4cm deposit of metastatic melanoma presenting in 1/3 LNs, showing 100% necrosis with no viable tumor in extracapsular tissue, margins uninvolved. Also has history of coronary artery disease status post bypass grafting  in 2006, PCI in 2015 and 2018 peripheral vascular disease, pulmonary embolus, history of breast cancer, hypertension, hyperlipidemia, carotid artery disease, and diabetes mellitus.  She been followed by Dr. Alveta since 2019. She underwent bypass surgery in 2006 and stent placements in 2015 and 2018. History of L breast cancer and underwent L mastectomy - pt thinks this was around 2011 with 1 node removed and it was negative. Has blood clot behind knee but unable to stent due to location. Going to have doppler repeated. Has rotator cuff tear in R shoulder but pt does not want surgery  PAIN:  Are you having pain? Yes NPRS scale: 7/10 Pain location: lower back and L leg Pain orientation: Left  PAIN TYPE: aching Pain description: constant  Aggravating factors: getting up from chair Relieving factors: moving around  PRECAUTIONS: Other: at risk for lymphedema bilaterally  RED FLAGS: PVD, s/p bypass in 2006, hx of blood clots, current blood clot behind knee    WEIGHT BEARING RESTRICTIONS: No  FALLS:  Has patient fallen in last 6 months? Yes. 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  COGNITION: Overall cognitive status: Within functional limits for tasks assessed   PALPATION:  Thickness palpable in medial upper arm bilaterally with R worse than  L  OBSERVATIONS / OTHER ASSESSMENTS: L forearm appears swollen and R upper arm appears swollen, difficulty getting up from chair and difficulty crossing legs due to tightness, redness at medial R upper arm  POSTURE: rounded shoulders, forward head  UPPER EXTREMITY AROM/PROM:  A/PROM RIGHT   eval   Shoulder extension   Shoulder flexion 118  Shoulder abduction 87  Shoulder internal rotation   Shoulder external rotation     (Blank rows = not tested)  A/PROM LEFT   eval  Shoulder extension   Shoulder flexion 140  Shoulder abduction 129  Shoulder internal rotation   Shoulder external rotation     (Blank rows = not tested)  LYMPHEDEMA ASSESSMENTS:   SURGERY TYPE/DATE: 2011 (per pt) L mastectomy and SLNB, also numerous surgeries for melanoma on R UE. 12/26/19 s/p shave biopsy R posterior upper arm, 02/06/20 s/p WLE and SLB with residual melanoma in situ at the excision, margins free, 0/2 R axillary LNs negative for melanoma. 12/10/20 shave biopsy of R forearm showed superficial spreading melanoma- 01/28/21 excision of a R radial distal forearm melanoma with no residual melanoma. Non-migratory SLN.- 08/28/21 R axillary core needle biopsy with path revealing metastatic melanoma at least 0.2cm, non-brisk TILs, positive margins, focal necrosis present. 03/05/22 limited R axillary LN dissection with path revealing 1.4cm deposit of metastatic melanoma presenting in 1/3 LNs, showing 100% necrosis with no viable tumor in extracapsular tissue, margins uninvolved.   NUMBER OF LYMPH NODES REMOVED: 0/1 on L, at least 5 nodes removed from R  CHEMOTHERAPY: completed for melanoma   LYMPHEDEMA ASSESSMENTS:   LANDMARK RIGHT  eval  At axilla  35.5  15 cm proximal to the proximal aspect of the olecranon process 36.2  10 cm proximal to the proximal aspect of the olecranon process 36.5  Olecranon process 30.5  15 cm proximal to the proximal aspect of the ulnar styloid process 27.4  10 cm proximal to the  proximal aspect of the ulnar styloid process 23  Just distal to the ulnar styloid process 17.2  Across hand at thumb web space 20.5  At base of 2nd digit 7  (Blank rows = not tested)  LANDMARK LEFT  eval  At axilla  37.5  15 cm proximal to the proximal aspect of the olecranon process 39  10 cm proximal to the proximal aspect of the olecranon process 38.2  Olecranon process 29.5  15 cm proximal to the proximal aspect of the ulnar styloid process 28.1  10 cm proximal to  the proximal aspect of the ulnar styloid process 23.6  Just distal to the ulnar styloid process 17.1  Across hand at thumb web space 20.1  At base of 2nd digit 7  (Blank rows = not tested)  Chest circumference just inferior to the axillae:  Chest circumference at the largest point:     LLIS:  Flowsheet Row Outpatient Rehab from 10/26/2023 in [facility]  Lymphedema Life Impact Scale Total Score 51.47 %     HOME EXERCISE PROGRAM: Can try piriformis stretch by crossing ankle at opposite knee  ASSESSMENT:  CLINICAL IMPRESSION: Patient is a 78 y.o. female who was seen today for physical therapy evaluation and treatment for bilateral UE lymphedema, L LE pain and decreased shoulder ROM bilaterally. Pt does not have a big measurable difference between her UE circumferences but it could be because both UEs have lymphedema. She has had lymph nodes  removed from bilateral axilla. She reports having difficulty getting clothing on both arms and her rings get too tight on both hands and this comes and goes. There is some redness at medial R upper arm that pt reports has been there for months and was unchanged after a course of antibiotics. Discussed lymphedema and the course of care including MLD and compression bandaging. Will start on R UE and see how pt responds. She also has L leg pain when getting up from a chair and changing positrons that goes down the back of her leg. Gave her piriformis stretch  but encouraged her to be cleared by ortho doctor before beginning treatment to her LLE to make sure there is not an issue with her back as she did have a major fall a year ago. She has decreased bilateral shoulder ROM. She would benefit from skilled PT services to improve bilateral shoulder ROM, decrease bilateral UE lymphedema and assist pt with obtaining compression garment, and once cleared by her doctor to address her L LE pain.    OBJECTIVE IMPAIRMENTS: decreased knowledge of condition, decreased knowledge of use of DME, decreased mobility, difficulty walking, decreased ROM, increased edema, increased fascial restrictions, impaired UE functional use, postural dysfunction, and pain.   ACTIVITY LIMITATIONS: carrying, lifting, bending, sitting, standing, squatting, sleeping, stairs, transfers, bed mobility, and reach over head  PARTICIPATION LIMITATIONS: meal prep, cleaning, laundry, shopping, community activity, and yard work  PERSONAL FACTORS: Age, Fitness, Past/current experiences, Time since onset of injury/illness/exacerbation, and 3+ comorbidities: hx of coronary artery disease, pulmonary embolus, hx of breast cancer, carotid artery disease and diabetes, stent placement  are also affecting patient's functional outcome.   REHAB POTENTIAL: Good  CLINICAL DECISION MAKING: Evolving/moderate complexity  EVALUATION COMPLEXITY: Moderate  GOALS: Goals reviewed with patient? Yes  SHORT TERM GOALS: Target date: 11/16/23  Pt will demonstrate a 1 cm reduction of lymphedema 15 cm proximal to R olecranon to decrease risk of cellulitis.  Baseline: Goal status: INITIAL  2.  Pt will obtain appropriate compression sleeve for long term management of RUE lymphedema. Baseline:  Goal status: INITIAL  3.  Pt will demonstrate 95 degrees of R shoulder abduction to allow her to reach out to the side. Baseline:  Goal status: INITIAL  4.  Pt and or family member will be able to independently don  compression bandaging for management of lymphedema.  Baseline:  Goal status: INITIAL   LONG TERM GOALS: Target date: 12/07/23  Pt will obtain compression garments for long term management of lymphedema. Baseline:  Goal status: INITIAL  2.  Pt will be independent in self MLD for long term management of lymphedema. Baseline:  Goal status: INITIAL  3.  Pt will report a 75% improvement in L LE pain once cleared by dr to do PT for this to allow improved function. Baseline:  Goal status: INITIAL  4.  Pt will repot 50% improvement in feelings of heaviness in bilateral UEs to allow improved comfort.  Baseline:  Goal status: INITIAL  5.  Pt will be independent in a home exercise program for continued stretching and strengthening.  Baseline:  Goal status: INITIAL   PLAN:  PT FREQUENCY: 3x/week  PT DURATION: 6 weeks  PLANNED INTERVENTIONS: [id number]- PT Re-evaluation, 97110-Therapeutic exercises, 97530- Therapeutic activity, 97112- Neuromuscular re-education, 97535- Self Care, [id number]- Manual therapy, [phone]- Orthotic Initial, [phone]- Orthotic/Prosthetic subsequent, Patient/Family education, Balance training, Joint mobilization, Therapeutic exercises, Therapeutic activity, Neuromuscular re-education, Gait training, and Self Care  PLAN FOR NEXT SESSION: begin  CDT to R UE, once controlled do on L UE, can work on L LE once cleared by doctor, PROM for bilateral shoulders - can also do pulleys or ball  [employer], PT 10/26/2023, 1:18 PM

## 2023-10-26 NOTE — Therapy (Signed)
 OUTPATIENT PHYSICAL THERAPY  UPPER EXTREMITY ONCOLOGY EVALUATION  Patient Name: Taylor [name] MRN: [id number] DOB:Jul 14, 1945, 78 y.o., female Today's Date: 10/27/2023  END OF SESSION:  PT End of Session - 10/27/23 0756     Visit Number 2    Number of Visits 19    Date for Recertification  12/07/23    PT Start Time 0800    PT Stop Time 0855    PT Time Calculation (min) 55 min    Activity Tolerance Patient tolerated treatment well    Behavior During Therapy [facility] for tasks assessed/performed           Past Medical History:  Diagnosis Date   Cancer of left breast (HCC)    Complication of anesthesia    Coronary artery disease    a. 2006 s/p CABG x 4 (LIMA->LAD, VG->Diag->OM, VG->RPDA);  b. 02/2015: [place]  s/p DES to [facility], VG->PDA & VG->D1->OM 100; c. 06/2015 Cath: diffuse dzs->Med rx; d. 11/2015 Cath/PCI: LM nl, LAD 100ost, 95d, D1 90ost, LCX 35ost, 65p, OM2 40, lat OM2 90, RCA 95p (2.75x16 Synergy DES), patent mid stent, RPLB 70, LIMA->LAD ok.EF55-65%.   Depression    Hx of tobacco use, presenting hazards to health    a. quit 2006.   Hypercholesteremia    Hypertension    Hypertensive heart disease    Echo 04/2019: Inferior HK, EF 55, normal RV SF, RVSP normal at 29.2, mild BAE, trivial MR, mild aortic valve sclerosis without stenosis   Left carotid bruit    a. 09/2014 Carotid U/S: 1-39% bilat ICA stenosis.   PAD (peripheral artery disease)    a. 11/2014 ABI: R: 0.63, L 0.59;  b. 11/2014 Periph Angio: bilat pop occlusions. L - short w/ reconstituion via collats in dist pop w/ 2 vessel runoff, R long w/ reconstitution in prox tib/peroneal arteries-->med Rx w/ pletal .   Pulmonary embolism (HCC) a. 2014.   Reflux esophagitis    Type II diabetes mellitus (HCC)    Past Surgical History:  Procedure Laterality Date   BREAST BIOPSY Left ~ 2012   BUNIONECTOMY Left 1970s   CARDIAC CATHETERIZATION  04/2004   CARDIAC CATHETERIZATION N/A 02/21/2015   Procedure: Left Heart Cath and Cors/Grafts  Angiography;  Surgeon: Victory LELON Sharps, MD;  Location: [facility] INVASIVE CV LAB;  Service: Cardiovascular;  Laterality: N/A;   CARDIAC CATHETERIZATION N/A 02/21/2015   Procedure: Coronary Stent Intervention;  Surgeon: Victory LELON Sharps, MD;  Location: [facility] INVASIVE CV LAB;  Service: Cardiovascular;  Laterality: N/A;   CARDIAC CATHETERIZATION N/A 02/21/2015   Procedure: Intravascular Pressure Wire/FFR Study;  Surgeon: Victory LELON Sharps, MD;  Location: [facility] INVASIVE CV LAB;  Service: Cardiovascular;  Laterality: N/A;   CARDIAC CATHETERIZATION N/A 06/20/2015   Procedure: Left Heart Cath and Cors/Grafts Angiography;  Surgeon: Ezra GORMAN Shuck, MD;  Location: [facility] INVASIVE CV LAB;  Service: Cardiovascular;  Laterality: N/A;   CARDIAC CATHETERIZATION N/A 11/19/2015   Procedure: Left Heart Cath and Cors/Grafts Angiography;  Surgeon: Peter M Swaziland, MD;  Location: [facility] INVASIVE CV LAB;  Service: Cardiovascular;  Laterality: N/A;   CARDIAC CATHETERIZATION N/A 11/19/2015   Procedure: Coronary Stent Intervention;  Surgeon: Peter M Swaziland, MD;  Location: [facility] INVASIVE CV LAB;  Service: Cardiovascular;  Laterality: N/A;   CATARACT EXTRACTION W/ INTRAOCULAR LENS  IMPLANT, BILATERAL Bilateral 1980s-1990s   CORONARY ANGIOPLASTY WITH STENT PLACEMENT  02/21/2015   1 stent   CORONARY ARTERY BYPASS GRAFT  05/2004   CABG X4   EYE SURGERY  LAPAROSCOPIC CHOLECYSTECTOMY  1990s   MASTECTOMY Left ~ 2012   PERIPHERAL VASCULAR CATHETERIZATION N/A 12/12/2014   Procedure: Abdominal Aortogram;  Surgeon: Deatrice DELENA Cage, MD;  Location: [facility] INVASIVE CV LAB;  Service: Cardiovascular;  Laterality: N/A;   RETINAL DETACHMENT SURGERY Right 1990s   Patient Active Problem List   Diagnosis Date Noted   Trauma 05/01/2022   Rib fractures 04/24/2022   Pain due to onychomycosis of toenails of both feet 04/09/2021   Diabetic neuropathy (HCC) 04/09/2021   Type 2 diabetes mellitus with vascular disease (HCC) 04/09/2021   Obesity (BMI 30-39.9) 01/16/2018   Mixed  hyperlipidemia 09/21/2016   Carotid artery disease 12/03/2015   OSA (obstructive sleep apnea) 12/03/2015   Pain and swelling of left lower leg 11/17/2015   Coronary artery disease    Hypertensive heart disease    Type II diabetes mellitus (HCC)    PAD (peripheral artery disease) 11/03/2014   Chest pain 08/06/2014   Nausea with vomiting 05/08/2014   Allergic reaction 05/08/2014   Hypercholesteremia    Hypertension    Diabetes mellitus (HCC)    Hx of tobacco use, presenting hazards to health 12/21/2013   Varicose veins of left lower extremity 12/18/2013   Cardiac enzymes elevated 03/01/2012   Chronic diastolic heart failure, NYHA class 1 (HCC) 03/01/2012   Bilateral pulmonary embolism (HCC) 02/29/2012   History of breast cancer, DCIS, Left mastectomy 03/21/2009. 03/06/2011   Chest pain 11/30/2010   Left carotid bruit 02/26/2009   CHRONIC LEFT LEG VENOUS INSUFFICIENCY 07/02/2008   EDEMA 05/14/2008   HYPERCHOLESTEROLEMIA 05/10/2008   CAD, ARTERY BYPASS GRAFT 05/10/2008   ESOPHAGITIS, REFLUX 05/10/2008   DM (diabetes mellitus) type I uncontrolled, periph vascular disorder (HCC) 10/18/2006   DEPRESSION 10/18/2006   Essential hypertension 10/18/2006    PCP: Carlin Dale Gull, MD  REFERRING PROVIDER: Darra Macintosh, NP  REFERRING DIAG: C43.61 (ICD-10-CM) - Malignant melanoma of right upper limb, including shoulder I89.0 (ICD-10-CM) - Lymphedema, not elsewhere classified  THERAPY DIAG:  Lymphedema, not elsewhere classified  Post-mastectomy lymphedema syndrome  Stiffness of left shoulder, not elsewhere classified  Stiffness of right shoulder, not elsewhere classified  Pain in left leg  Abnormal posture  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 My shirts no longer fit this arm. They were very tight. I did not know what was going on. I had not gained any weight until very recently.   PERTINENT HISTORY: History of melanoma as follows: - 12/26/19 presented with a lesion on R posterior upper arm to dermatology s/p shave biopsy with path showing lentiginous melanoma at least 2.57mm deep, ulcerated, 02/06/20 s/p WLE and SLB with residual melanoma in situ at the excision, margins free, 0/2 R axillary LNs negative for melanoma. Staged as pT3bN0. - 12/10/20 shave biopsy of R forearm showed superficial spreading melanoma, at least 1.99mm thick, margins extended to base. - 01/28/21 excision of a R radial distal forearm melanoma with no residual melanoma. Non-migratory SLN. Staged as pT2aNX. BRAF negative again. - 08/19/21 presented for routine follow-up with a 2cm R axillary LN was palpated.  - 08/28/21 R axillary core needle biopsy with path revealing metastatic melanoma at least 0.2cm, non-brisk TILs, positive margins, focal necrosis present. LAG3 highlights about 10% of cells. - 09/01/21 PET-CT showed 3cm R axillary LN with subcentimeter RUL lung nodule - 09/12/21 MRI brain negative for metastatic disease- 09/18/21-02/11/22 neoadjuvant pembrolizumab  - 11/11/21 PET-CT after 4 cycles showed response in R axilla but still persistent disease, new rectal thickening and uptake- 02/11/22 PET-CT showed a near complete response to NAJ therapy with resolution of disease in R axilla, still a bit of rectal thickening compared to prior - 03/05/22 limited R axillary LN dissection with path revealing 1.4cm deposit of metastatic melanoma presenting in 1/3 LNs, showing 100% necrosis with no viable tumor in extracapsular tissue, margins uninvolved. Also has history of coronary artery  disease status post bypass grafting in 2006, PCI in 2015 and 2018 peripheral vascular disease, pulmonary embolus, history of breast cancer, hypertension, hyperlipidemia, carotid artery disease, and diabetes mellitus.  She been followed by Dr. Alveta since 2019. She underwent bypass surgery in 2006 and stent placements in 2015 and 2018. History of L breast cancer and underwent L mastectomy - pt thinks this was around 2011 with 1 node removed and it was negative. Has blood clot behind knee but unable to stent due to location. Going to have doppler repeated. Has rotator cuff tear in R shoulder but pt does not want surgery  PAIN:  Are you having pain? Yes NPRS scale: 7/10 Pain location: lower back and L leg Pain orientation: Left  PAIN TYPE: aching Pain description: constant  Aggravating factors: getting up from chair Relieving factors: moving around  PRECAUTIONS: Other: at risk for lymphedema bilaterally  RED FLAGS: PVD, s/p bypass in 2006, hx of blood clots, current blood clot behind knee    WEIGHT BEARING RESTRICTIONS: No  FALLS:  Has patient fallen in last 6 months? Yes. 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  COGNITION: Overall cognitive status: Within  functional limits for tasks assessed   PALPATION: Thickness palpable in medial upper arm  bilaterally with R worse than L  OBSERVATIONS / OTHER ASSESSMENTS: L forearm appears swollen and R upper arm appears swollen, difficulty getting up from chair and difficulty crossing legs due to tightness, redness at medial R upper arm  POSTURE: rounded shoulders, forward head  UPPER EXTREMITY AROM/PROM:  A/PROM RIGHT   eval   Shoulder extension   Shoulder flexion 118  Shoulder abduction 87  Shoulder internal rotation   Shoulder external rotation     (Blank rows = not tested)  A/PROM LEFT   eval  Shoulder extension   Shoulder flexion 140  Shoulder abduction 129  Shoulder internal rotation   Shoulder external rotation     (Blank rows = not tested)  LYMPHEDEMA ASSESSMENTS:  SURGERY TYPE/DATE: 2011 (per pt) L mastectomy and SLNB, also numerous surgeries for melanoma on R UE. 12/26/19 s/p shave biopsy R posterior upper arm, 02/06/20 s/p WLE and SLB with residual melanoma in situ at the excision, margins free, 0/2 R axillary LNs negative for melanoma. 12/10/20 shave biopsy of R forearm showed superficial spreading melanoma- 01/28/21 excision of a R radial distal forearm melanoma with no residual melanoma. Non-migratory SLN.- 08/28/21 R axillary core needle biopsy with path revealing metastatic melanoma at least 0.2cm, non-brisk TILs, positive margins, focal necrosis present. 03/05/22 limited R axillary LN dissection with path revealing 1.4cm deposit of metastatic melanoma presenting in 1/3 LNs, showing 100% necrosis with no viable tumor in extracapsular tissue, margins uninvolved.   NUMBER OF LYMPH NODES REMOVED: 0/1 on L, at least 5 nodes removed from R  CHEMOTHERAPY: completed for melanoma   LYMPHEDEMA ASSESSMENTS:   LANDMARK RIGHT  eval  At axilla  35.5  15 cm proximal to the proximal aspect of the olecranon process 36.2  10 cm proximal to the proximal aspect of the olecranon process 36.5  Olecranon process 30.5  15 cm proximal to the proximal aspect of the ulnar styloid process  27.4  10 cm proximal to the proximal aspect of the ulnar styloid process 23  Just distal to the ulnar styloid process 17.2  Across hand at thumb web space 20.5  At base of 2nd digit 7  (Blank rows = not tested)  LANDMARK LEFT  eval  At axilla  37.5  15 cm proximal to the proximal aspect of the olecranon process 39  10 cm proximal to the proximal aspect of the olecranon process 38.2  Olecranon process 29.5  15 cm proximal to the proximal aspect of the ulnar styloid process 28.1  10 cm proximal to  the proximal aspect of the ulnar styloid process 23.6  Just distal to the ulnar styloid process 17.1  Across hand at thumb web space 20.1  At base of 2nd digit 7  (Blank rows = not tested)  Rt volume:   eval:  Lt :   Eval:   Chest circumference just inferior to the axillae:  Chest circumference at the largest point:     LLIS:  Flowsheet Row Outpatient Rehab from 10/26/2023 in [facility]  Lymphedema Life Impact Scale Total Score 51.47 %    TODAY'S TREATMENT 10/27/23 Education on CDT, bandaging, when to remove, how to wash, etc In supine: Short neck, superficial and deep abdominals (careful near Rt ribcage where pt had recent fracture) Rt axillary nodes and R inguinal nodes and establishment of axilloinguinal pathway, then R UE working proximal to distal, moving  inner upper arm outwards and upwards, and doing both sides of forearms, spending extra time in any areas of fibrosis then retracing all steps Then seated application of bandage: eucerin lotion, tg soft size medium, artiflex from hand to axilla, 6cm hand with lighter application due to minimal swelling, 8cm starting at wrist with X at elbow, then 10cm wrist to axilla    HOME EXERCISE PROGRAM: Can try piriformis stretch by crossing ankle at opposite knee  ASSESSMENT:  CLINICAL IMPRESSION:  Started first session of CDT for the RT arm which pt tolerated well.  She was instructed on  removal, washing, self care, and remedial movements.  Noted congestion mainly in the Rt medial elbow and upper arm.    OBJECTIVE IMPAIRMENTS: decreased knowledge of condition, decreased knowledge of use of DME, decreased mobility, difficulty walking, decreased ROM, increased edema, increased fascial restrictions, impaired UE functional use, postural dysfunction, and pain.   ACTIVITY LIMITATIONS: carrying, lifting, bending, sitting, standing, squatting, sleeping, stairs, transfers, bed mobility, and reach over head  PARTICIPATION LIMITATIONS: meal prep, cleaning, laundry, shopping, community activity, and yard work  PERSONAL FACTORS: Age, Fitness, Past/current experiences, Time since onset of injury/illness/exacerbation, and 3+ comorbidities: hx of coronary artery disease, pulmonary embolus, hx of breast cancer, carotid artery disease and diabetes, stent placement  are also affecting patient's functional outcome.   REHAB POTENTIAL: Good  CLINICAL DECISION MAKING: Evolving/moderate complexity  EVALUATION COMPLEXITY: Moderate  GOALS: Goals reviewed with patient? Yes  SHORT TERM GOALS: Target date: 11/16/23  Pt will demonstrate a 1 cm reduction of lymphedema 15 cm proximal to R olecranon to decrease risk of cellulitis.  Baseline: Goal status: INITIAL  2.  Pt will obtain appropriate compression sleeve for long term management of RUE lymphedema. Baseline:  Goal status: INITIAL  3.  Pt will demonstrate 95 degrees of R shoulder abduction to allow her to reach out to the side. Baseline:  Goal status: INITIAL  4.  Pt and or family member will be able to independently don compression bandaging for management of lymphedema.  Baseline:  Goal status: INITIAL   LONG TERM GOALS: Target date: 12/07/23  Pt will obtain compression garments for long term management of lymphedema. Baseline:  Goal status: INITIAL  2.  Pt will be independent in self MLD for long term management of  lymphedema. Baseline:  Goal status: INITIAL  3.  Pt will report a 75% improvement in L LE pain once cleared by dr to do PT for this to allow improved function. Baseline:  Goal status: INITIAL  4.  Pt will repot 50% improvement in feelings of heaviness in bilateral UEs to allow improved comfort.  Baseline:  Goal status: INITIAL  5.  Pt will be independent in a home exercise program for continued stretching and strengthening.  Baseline:  Goal status: INITIAL   PLAN:  PT FREQUENCY: 3x/week  PT DURATION: 6 weeks  PLANNED INTERVENTIONS: [id number]- PT Re-evaluation, 97110-Therapeutic exercises, 97530- Therapeutic activity, 97112- Neuromuscular re-education, 97535- Self Care, [id number]- Manual therapy, [phone]- Orthotic Initial, [phone]- Orthotic/Prosthetic subsequent, Patient/Family education, Balance training, Joint mobilization, Therapeutic exercises, Therapeutic activity, Neuromuscular re-education, Gait training, and Self Care  PLAN FOR NEXT SESSION: begin CDT to R UE, once controlled do on L UE, can work on L LE once cleared by doctor, PROM for bilateral shoulders - can also do pulleys or ball  Fardeen Steinberger, Saddie SAUNDERS, PT 10/27/2023, 8:57 AM

## 2023-10-27 ENCOUNTER — Ambulatory Visit: Admitting: Rehabilitation

## 2023-10-27 ENCOUNTER — Encounter: Payer: Self-pay | Admitting: Rehabilitation

## 2023-10-27 DIAGNOSIS — M25611 Stiffness of right shoulder, not elsewhere classified: Secondary | ICD-10-CM

## 2023-10-27 DIAGNOSIS — I890 Lymphedema, not elsewhere classified: Secondary | ICD-10-CM

## 2023-10-27 DIAGNOSIS — M79605 Pain in left leg: Secondary | ICD-10-CM | POA: Diagnosis not present

## 2023-10-27 DIAGNOSIS — C4361 Malignant melanoma of right upper limb, including shoulder: Secondary | ICD-10-CM

## 2023-10-27 DIAGNOSIS — M25612 Stiffness of left shoulder, not elsewhere classified: Secondary | ICD-10-CM

## 2023-10-27 DIAGNOSIS — R293 Abnormal posture: Secondary | ICD-10-CM

## 2023-10-27 DIAGNOSIS — I972 Postmastectomy lymphedema syndrome: Secondary | ICD-10-CM | POA: Diagnosis not present

## 2023-10-29 ENCOUNTER — Ambulatory Visit: Admitting: Rehabilitation

## 2023-10-29 DIAGNOSIS — I890 Lymphedema, not elsewhere classified: Secondary | ICD-10-CM | POA: Diagnosis not present

## 2023-10-29 DIAGNOSIS — M25612 Stiffness of left shoulder, not elsewhere classified: Secondary | ICD-10-CM | POA: Diagnosis not present

## 2023-10-29 DIAGNOSIS — R293 Abnormal posture: Secondary | ICD-10-CM | POA: Diagnosis not present

## 2023-10-29 DIAGNOSIS — I972 Postmastectomy lymphedema syndrome: Secondary | ICD-10-CM | POA: Diagnosis not present

## 2023-10-29 DIAGNOSIS — M79605 Pain in left leg: Secondary | ICD-10-CM | POA: Diagnosis not present

## 2023-10-29 DIAGNOSIS — M25611 Stiffness of right shoulder, not elsewhere classified: Secondary | ICD-10-CM | POA: Diagnosis not present

## 2023-10-29 NOTE — Therapy (Signed)
 OUTPATIENT PHYSICAL THERAPY  UPPER EXTREMITY ONCOLOGY EVALUATION  Patient Name: Taylor [name] MRN: [id number] DOB:November 24, 1945, 78 y.o., female Today's Date: 10/29/2023  END OF SESSION:  PT End of Session - 10/29/23 1214     Visit Number 3    Number of Visits 19    Date for Recertification  12/07/23    PT Start Time 1100    PT Stop Time 1153    PT Time Calculation (min) 53 min    Activity Tolerance Patient tolerated treatment well    Behavior During Therapy [facility] for tasks assessed/performed            Past Medical History:  Diagnosis Date   Cancer of left breast (HCC)    Complication of anesthesia    Coronary artery disease    a. 2006 s/p CABG x 4 (LIMA->LAD, VG->Diag->OM, VG->RPDA);  b. 02/2015: [place]  s/p DES to [facility], VG->PDA & VG->D1->OM 100; c. 06/2015 Cath: diffuse dzs->Med rx; d. 11/2015 Cath/PCI: LM nl, LAD 100ost, 95d, D1 90ost, LCX 35ost, 65p, OM2 40, lat OM2 90, RCA 95p (2.75x16 Synergy DES), patent mid stent, RPLB 70, LIMA->LAD ok.EF55-65%.   Depression    Hx of tobacco use, presenting hazards to health    a. quit 2006.   Hypercholesteremia    Hypertension    Hypertensive heart disease    Echo 04/2019: Inferior HK, EF 55, normal RV SF, RVSP normal at 29.2, mild BAE, trivial MR, mild aortic valve sclerosis without stenosis   Left carotid bruit    a. 09/2014 Carotid U/S: 1-39% bilat ICA stenosis.   PAD (peripheral artery disease)    a. 11/2014 ABI: R: 0.63, L 0.59;  b. 11/2014 Periph Angio: bilat pop occlusions. L - short w/ reconstituion via collats in dist pop w/ 2 vessel runoff, R long w/ reconstitution in prox tib/peroneal arteries-->med Rx w/ pletal .   Pulmonary embolism (HCC) a. 2014.   Reflux esophagitis    Type II diabetes mellitus (HCC)    Past Surgical History:  Procedure Laterality Date   BREAST BIOPSY Left ~ 2012   BUNIONECTOMY Left 1970s   CARDIAC CATHETERIZATION  04/2004   CARDIAC CATHETERIZATION N/A 02/21/2015   Procedure: Left Heart Cath and  Cors/Grafts Angiography;  Surgeon: Victory LELON Sharps, MD;  Location: [facility] INVASIVE CV LAB;  Service: Cardiovascular;  Laterality: N/A;   CARDIAC CATHETERIZATION N/A 02/21/2015   Procedure: Coronary Stent Intervention;  Surgeon: Victory LELON Sharps, MD;  Location: [facility] INVASIVE CV LAB;  Service: Cardiovascular;  Laterality: N/A;   CARDIAC CATHETERIZATION N/A 02/21/2015   Procedure: Intravascular Pressure Wire/FFR Study;  Surgeon: Victory LELON Sharps, MD;  Location: [facility] INVASIVE CV LAB;  Service: Cardiovascular;  Laterality: N/A;   CARDIAC CATHETERIZATION N/A 06/20/2015   Procedure: Left Heart Cath and Cors/Grafts Angiography;  Surgeon: Ezra GORMAN Shuck, MD;  Location: [facility] INVASIVE CV LAB;  Service: Cardiovascular;  Laterality: N/A;   CARDIAC CATHETERIZATION N/A 11/19/2015   Procedure: Left Heart Cath and Cors/Grafts Angiography;  Surgeon: Peter M Swaziland, MD;  Location: [facility] INVASIVE CV LAB;  Service: Cardiovascular;  Laterality: N/A;   CARDIAC CATHETERIZATION N/A 11/19/2015   Procedure: Coronary Stent Intervention;  Surgeon: Peter M Swaziland, MD;  Location: [facility] INVASIVE CV LAB;  Service: Cardiovascular;  Laterality: N/A;   CATARACT EXTRACTION W/ INTRAOCULAR LENS  IMPLANT, BILATERAL Bilateral 1980s-1990s   CORONARY ANGIOPLASTY WITH STENT PLACEMENT  02/21/2015   1 stent   CORONARY ARTERY BYPASS GRAFT  05/2004   CABG X4   EYE SURGERY  LAPAROSCOPIC CHOLECYSTECTOMY  1990s   MASTECTOMY Left ~ 2012   PERIPHERAL VASCULAR CATHETERIZATION N/A 12/12/2014   Procedure: Abdominal Aortogram;  Surgeon: Deatrice DELENA Cage, MD;  Location: [facility] INVASIVE CV LAB;  Service: Cardiovascular;  Laterality: N/A;   RETINAL DETACHMENT SURGERY Right 1990s   Patient Active Problem List   Diagnosis Date Noted   Trauma 05/01/2022   Rib fractures 04/24/2022   Pain due to onychomycosis of toenails of both feet 04/09/2021   Diabetic neuropathy (HCC) 04/09/2021   Type 2 diabetes mellitus with vascular disease (HCC) 04/09/2021   Obesity (BMI 30-39.9)  01/16/2018   Mixed hyperlipidemia 09/21/2016   Carotid artery disease 12/03/2015   OSA (obstructive sleep apnea) 12/03/2015   Pain and swelling of left lower leg 11/17/2015   Coronary artery disease    Hypertensive heart disease    Type II diabetes mellitus (HCC)    PAD (peripheral artery disease) 11/03/2014   Chest pain 08/06/2014   Nausea with vomiting 05/08/2014   Allergic reaction 05/08/2014   Hypercholesteremia    Hypertension    Diabetes mellitus (HCC)    Hx of tobacco use, presenting hazards to health 12/21/2013   Varicose veins of left lower extremity 12/18/2013   Cardiac enzymes elevated 03/01/2012   Chronic diastolic heart failure, NYHA class 1 (HCC) 03/01/2012   Bilateral pulmonary embolism (HCC) 02/29/2012   History of breast cancer, DCIS, Left mastectomy 03/21/2009. 03/06/2011   Chest pain 11/30/2010   Left carotid bruit 02/26/2009   CHRONIC LEFT LEG VENOUS INSUFFICIENCY 07/02/2008   EDEMA 05/14/2008   HYPERCHOLESTEROLEMIA 05/10/2008   CAD, ARTERY BYPASS GRAFT 05/10/2008   ESOPHAGITIS, REFLUX 05/10/2008   DM (diabetes mellitus) type I uncontrolled, periph vascular disorder (HCC) 10/18/2006   DEPRESSION 10/18/2006   Essential hypertension 10/18/2006    PCP: Carlin Dale Gull, MD  REFERRING PROVIDER: Darra Macintosh, NP  REFERRING DIAG: C43.61 (ICD-10-CM) - Malignant melanoma of right upper limb, including shoulder I89.0 (ICD-10-CM) - Lymphedema, not elsewhere classified  THERAPY DIAG:  Lymphedema, not elsewhere classified  Post-mastectomy lymphedema syndrome  ONSET DATE: 03/05/22  Rationale for Evaluation and Treatment: Rehabilitation  SUBJECTIVE:                                                                                                                                                                                           SUBJECTIVE STATEMENT:  The hand part started getting itchy so I stuck something up there to itch it and made myself bleed. It  started getting itchy the last night.    EVAL: Every since the surgery especially the last one - my clothes stopped fitting me. My shirts  no longer fit this arm. They were very tight. I did not know what was going on. I had not gained any weight until very recently.   PERTINENT HISTORY: History of melanoma as follows: - 12/26/19 presented with a lesion on R posterior upper arm to dermatology s/p shave biopsy with path showing lentiginous melanoma at least 2.75mm deep, ulcerated, 02/06/20 s/p WLE and SLB with residual melanoma in situ at the excision, margins free, 0/2 R axillary LNs negative for melanoma. Staged as pT3bN0. - 12/10/20 shave biopsy of R forearm showed superficial spreading melanoma, at least 1.24mm thick, margins extended to base. - 01/28/21 excision of a R radial distal forearm melanoma with no residual melanoma. Non-migratory SLN. Staged as pT2aNX. BRAF negative again. - 08/19/21 presented for routine follow-up with a 2cm R axillary LN was palpated.  - 08/28/21 R axillary core needle biopsy with path revealing metastatic melanoma at least 0.2cm, non-brisk TILs, positive margins, focal necrosis present. LAG3 highlights about 10% of cells. - 09/01/21 PET-CT showed 3cm R axillary LN with subcentimeter RUL lung nodule - 09/12/21 MRI brain negative for metastatic disease- 09/18/21-02/11/22 neoadjuvant pembrolizumab  - 11/11/21 PET-CT after 4 cycles showed response in R axilla but still persistent disease, new rectal thickening and uptake- 02/11/22 PET-CT showed a near complete response to NAJ therapy with resolution of disease in R axilla, still a bit of rectal thickening compared to prior - 03/05/22 limited R axillary LN dissection with path revealing 1.4cm deposit of metastatic melanoma presenting in 1/3 LNs, showing 100% necrosis with no viable tumor in extracapsular tissue, margins uninvolved. Also has history of coronary artery disease status post bypass grafting in 2006, PCI in 2015 and 2018 peripheral  vascular disease, pulmonary embolus, history of breast cancer, hypertension, hyperlipidemia, carotid artery disease, and diabetes mellitus.  She been followed by Dr. Alveta since 2019. She underwent bypass surgery in 2006 and stent placements in 2015 and 2018. History of L breast cancer and underwent L mastectomy - pt thinks this was around 2011 with 1 node removed and it was negative. Has blood clot behind knee but unable to stent due to location. Going to have doppler repeated. Has rotator cuff tear in R shoulder but pt does not want surgery  PAIN:  Are you having pain? Yes NPRS scale: 7/10 Pain location: lower back and L leg Pain orientation: Left  PAIN TYPE: aching Pain description: constant  Aggravating factors: getting up from chair Relieving factors: moving around  PRECAUTIONS: Other: at risk for lymphedema bilaterally  RED FLAGS: PVD, s/p bypass in 2006, hx of blood clots, current blood clot behind knee    WEIGHT BEARING RESTRICTIONS: No  FALLS:  Has patient fallen in last 6 months? Yes. 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  COGNITION: Overall cognitive status: Within functional limits for tasks assessed   PALPATION: Thickness palpable in  medial upper arm bilaterally with R worse than L  OBSERVATIONS / OTHER ASSESSMENTS: L forearm appears  swollen and R upper arm appears swollen, difficulty getting up from chair and difficulty crossing legs due to tightness, redness at medial R upper arm  POSTURE: rounded shoulders, forward head  UPPER EXTREMITY AROM/PROM:  A/PROM RIGHT   eval   Shoulder extension   Shoulder flexion 118  Shoulder abduction 87  Shoulder internal rotation   Shoulder external rotation     (Blank rows = not tested)  A/PROM LEFT   eval  Shoulder extension   Shoulder flexion 140  Shoulder abduction 129  Shoulder internal rotation   Shoulder external rotation     (Blank rows = not tested)  LYMPHEDEMA ASSESSMENTS:  SURGERY TYPE/DATE: 2011 (per pt) L mastectomy and SLNB, also numerous surgeries for melanoma on R UE. 12/26/19 s/p shave biopsy R posterior upper arm, 02/06/20 s/p WLE and SLB with residual melanoma in situ at the excision, margins free, 0/2 R axillary LNs negative for melanoma. 12/10/20 shave biopsy of R forearm showed superficial spreading melanoma- 01/28/21 excision of a R radial distal forearm melanoma with no residual melanoma. Non-migratory SLN.- 08/28/21 R axillary core needle biopsy with path revealing metastatic melanoma at least 0.2cm, non-brisk TILs, positive margins, focal necrosis present. 03/05/22 limited R axillary LN dissection with path revealing 1.4cm deposit of metastatic melanoma presenting in 1/3 LNs, showing 100% necrosis with no viable tumor in extracapsular tissue, margins uninvolved.   NUMBER OF LYMPH NODES REMOVED: 0/1 on L, at least 5 nodes removed from R  CHEMOTHERAPY: completed for melanoma   LYMPHEDEMA ASSESSMENTS:   LANDMARK RIGHT  eval  At axilla  35.5  15 cm proximal to the proximal aspect of the olecranon process 36.2  10 cm proximal to the proximal aspect of the olecranon process 36.5  Olecranon process 30.5  15 cm proximal to the proximal aspect of the ulnar styloid process 27.4  10 cm proximal to the proximal aspect of the ulnar styloid process 23  Just  distal to the ulnar styloid process 17.2  Across hand at thumb web space 20.5  At base of 2nd digit 7  (Blank rows = not tested)  LANDMARK LEFT  eval  At axilla  37.5  15 cm proximal to the proximal aspect of the olecranon process 39  10 cm proximal to the proximal aspect of the olecranon process 38.2  Olecranon process 29.5  15 cm proximal to the proximal aspect of the ulnar styloid process 28.1  10 cm proximal to  the proximal aspect of the ulnar styloid process 23.6  Just distal to the ulnar styloid process 17.1  Across hand at thumb web space 20.1  At base of 2nd digit 7  (Blank rows = not tested)  Rt volume:   eval:  Lt :   Eval:   Chest circumference just inferior to the axillae:  Chest circumference at the largest point:     LLIS:  Flowsheet Row Outpatient Rehab from 10/26/2023 in [facility]  Lymphedema Life Impact Scale Total Score 51.47 %    TODAY'S TREATMENT 10/13/23 Reviewed importance of not sticking anything in the bandage to itch especially with her fragile skin.  She had a bandaid on the Rt base of the thumb and some possible new spots of ecchymosis but she has a lot of these already so it was hard to tell which were new.   In supine: Short neck, superficial  and deep abdominals (careful near Rt ribcage where pt had recent fracture) Rt axillary nodes and R inguinal nodes and establishment of axilloinguinal pathway, then R UE working proximal to distal, moving inner upper arm outwards and upwards, and doing both sides of forearms, spending extra time in any areas of fibrosis then retracing all steps Then seated application of bandage: eucerin lotion, tg soft size medium, artiflex from hand to axilla, 6cm hand with lighter application due to minimal swelling, 8cm starting at wrist with X at elbow, then 10cm wrist to axilla  10/27/23 Education on CDT, bandaging, when to remove, how to wash, etc In supine: Short neck,  superficial and deep abdominals (careful near Rt ribcage where pt had recent fracture) Rt axillary nodes and R inguinal nodes and establishment of axilloinguinal pathway, then R UE working proximal to distal, moving inner upper arm outwards and upwards, and doing both sides of forearms, spending extra time in any areas of fibrosis then retracing all steps Then seated application of bandage: eucerin lotion, tg soft size medium, artiflex from hand to axilla, 6cm hand with lighter application due to minimal swelling, 8cm starting at wrist with X at elbow, then 10cm wrist to axilla    HOME EXERCISE PROGRAM: Can try piriformis stretch by crossing ankle at opposite knee  ASSESSMENT:  CLINICAL IMPRESSION: Continued CDT for the RT arm which pt tolerated well.  She was reminded when to remove the bandage and to not use anything to itch on the inside.  No sig visual changes after the first bandaging.     OBJECTIVE IMPAIRMENTS: decreased knowledge of condition, decreased knowledge of use of DME, decreased mobility, difficulty walking, decreased ROM, increased edema, increased fascial restrictions, impaired UE functional use, postural dysfunction, and pain.   ACTIVITY LIMITATIONS: carrying, lifting, bending, sitting, standing, squatting, sleeping, stairs, transfers, bed mobility, and reach over head  PARTICIPATION LIMITATIONS: meal prep, cleaning, laundry, shopping, community activity, and yard work  PERSONAL FACTORS: Age, Fitness, Past/current experiences, Time since onset of injury/illness/exacerbation, and 3+ comorbidities: hx of coronary artery disease, pulmonary embolus, hx of breast cancer, carotid artery disease and diabetes, stent placement  are also affecting patient's functional outcome.   REHAB POTENTIAL: Good  CLINICAL DECISION MAKING: Evolving/moderate complexity  EVALUATION COMPLEXITY: Moderate  GOALS: Goals reviewed with patient? Yes  SHORT TERM GOALS: Target date: 11/16/23  Pt will  demonstrate a 1 cm reduction of lymphedema 15 cm proximal to R olecranon to decrease risk of cellulitis.  Baseline: Goal status: INITIAL  2.  Pt will obtain appropriate compression sleeve for long term management of RUE lymphedema. Baseline:  Goal status: INITIAL  3.  Pt will demonstrate 95 degrees of R shoulder abduction to allow her to reach out to the side. Baseline:  Goal status: INITIAL  4.  Pt and or family member will be able to independently don compression bandaging for management of lymphedema.  Baseline:  Goal status: INITIAL   LONG TERM GOALS: Target date: 12/07/23  Pt will obtain compression garments for long term management of lymphedema. Baseline:  Goal status: INITIAL  2.  Pt will be independent in self MLD for long term management of lymphedema. Baseline:  Goal status: INITIAL  3.  Pt will report a 75% improvement in L LE pain once cleared by dr to do PT for this to allow improved function. Baseline:  Goal status: INITIAL  4.  Pt will repot 50% improvement in feelings of heaviness in bilateral UEs to allow improved comfort.  Baseline:  Goal status: INITIAL  5.  Pt will be independent in a home exercise program for continued stretching and strengthening.  Baseline:  Goal status: INITIAL   PLAN:  PT FREQUENCY: 3x/week  PT DURATION: 6 weeks  PLANNED INTERVENTIONS: [id number]- PT Re-evaluation, 97110-Therapeutic exercises, 97530- Therapeutic activity, 97112- Neuromuscular re-education, 97535- Self Care, [id number]- Manual therapy, [phone]- Orthotic Initial, [phone]- Orthotic/Prosthetic subsequent, Patient/Family education, Balance training, Joint mobilization, Therapeutic exercises, Therapeutic activity, Neuromuscular re-education, Gait training, and Self Care  PLAN FOR NEXT SESSION: Measure Mon - CDT to R UE, once controlled do on L UE, can work on L LE once cleared by doctor, PROM for bilateral shoulders - can also do pulleys or ball  Norris Bodley, Saddie SAUNDERS, PT 10/29/2023,  12:14 PM

## 2023-11-01 ENCOUNTER — Ambulatory Visit: Admitting: Rehabilitation

## 2023-11-01 ENCOUNTER — Ambulatory Visit (HOSPITAL_COMMUNITY)
Admission: RE | Admit: 2023-11-01 | Discharge: 2023-11-01 | Disposition: A | Source: Ambulatory Visit | Attending: Physician Assistant | Admitting: Physician Assistant

## 2023-11-01 ENCOUNTER — Ambulatory Visit (HOSPITAL_COMMUNITY)
Admission: RE | Admit: 2023-11-01 | Discharge: 2023-11-01 | Disposition: A | Discharge status: Home / Self Care | Source: Ambulatory Visit | Attending: Physician Assistant | Admitting: Physician Assistant

## 2023-11-01 ENCOUNTER — Encounter: Payer: Self-pay | Admitting: Rehabilitation

## 2023-11-01 DIAGNOSIS — I890 Lymphedema, not elsewhere classified: Secondary | ICD-10-CM

## 2023-11-01 DIAGNOSIS — M25612 Stiffness of left shoulder, not elsewhere classified: Secondary | ICD-10-CM

## 2023-11-01 DIAGNOSIS — I972 Postmastectomy lymphedema syndrome: Secondary | ICD-10-CM | POA: Diagnosis not present

## 2023-11-01 DIAGNOSIS — M25611 Stiffness of right shoulder, not elsewhere classified: Secondary | ICD-10-CM | POA: Diagnosis not present

## 2023-11-01 DIAGNOSIS — M79605 Pain in left leg: Secondary | ICD-10-CM | POA: Diagnosis not present

## 2023-11-01 DIAGNOSIS — C4361 Malignant melanoma of right upper limb, including shoulder: Secondary | ICD-10-CM

## 2023-11-01 DIAGNOSIS — R293 Abnormal posture: Secondary | ICD-10-CM

## 2023-11-01 DIAGNOSIS — I25118 Atherosclerotic heart disease of native coronary artery with other forms of angina pectoris: Secondary | ICD-10-CM | POA: Insufficient documentation

## 2023-11-01 NOTE — Therapy (Signed)
 OUTPATIENT PHYSICAL THERAPY  UPPER EXTREMITY ONCOLOGY  Patient Name: Taylor [name] MRN: [id number] DOB:May 26, 1945, 78 y.o., female Today's Date: 11/01/2023  END OF SESSION:  PT End of Session - 11/01/23 1145     Visit Number 4    Number of Visits 19    Date for Recertification  12/07/23    PT Start Time 1100    PT Stop Time 1150    PT Time Calculation (min) 50 min    Activity Tolerance Patient tolerated treatment well    Behavior During Therapy [facility] for tasks assessed/performed             Past Medical History:  Diagnosis Date   Cancer of left breast (HCC)    Complication of anesthesia    Coronary artery disease    a. 2006 s/p CABG x 4 (LIMA->LAD, VG->Diag->OM, VG->RPDA);  b. 02/2015: [place]  s/p DES to [facility], VG->PDA & VG->D1->OM 100; c. 06/2015 Cath: diffuse dzs->Med rx; d. 11/2015 Cath/PCI: LM nl, LAD 100ost, 95d, D1 90ost, LCX 35ost, 65p, OM2 40, lat OM2 90, RCA 95p (2.75x16 Synergy DES), patent mid stent, RPLB 70, LIMA->LAD ok.EF55-65%.   Depression    Hx of tobacco use, presenting hazards to health    a. quit 2006.   Hypercholesteremia    Hypertension    Hypertensive heart disease    Echo 04/2019: Inferior HK, EF 55, normal RV SF, RVSP normal at 29.2, mild BAE, trivial MR, mild aortic valve sclerosis without stenosis   Left carotid bruit    a. 09/2014 Carotid U/S: 1-39% bilat ICA stenosis.   PAD (peripheral artery disease)    a. 11/2014 ABI: R: 0.63, L 0.59;  b. 11/2014 Periph Angio: bilat pop occlusions. L - short w/ reconstituion via collats in dist pop w/ 2 vessel runoff, R long w/ reconstitution in prox tib/peroneal arteries-->med Rx w/ pletal .   Pulmonary embolism (HCC) a. 2014.   Reflux esophagitis    Type II diabetes mellitus (HCC)    Past Surgical History:  Procedure Laterality Date   BREAST BIOPSY Left ~ 2012   BUNIONECTOMY Left 1970s   CARDIAC CATHETERIZATION  04/2004   CARDIAC CATHETERIZATION N/A 02/21/2015   Procedure: Left Heart Cath and Cors/Grafts  Angiography;  Surgeon: Victory LELON Sharps, MD;  Location: [facility] INVASIVE CV LAB;  Service: Cardiovascular;  Laterality: N/A;   CARDIAC CATHETERIZATION N/A 02/21/2015   Procedure: Coronary Stent Intervention;  Surgeon: Victory LELON Sharps, MD;  Location: [facility] INVASIVE CV LAB;  Service: Cardiovascular;  Laterality: N/A;   CARDIAC CATHETERIZATION N/A 02/21/2015   Procedure: Intravascular Pressure Wire/FFR Study;  Surgeon: Victory LELON Sharps, MD;  Location: [facility] INVASIVE CV LAB;  Service: Cardiovascular;  Laterality: N/A;   CARDIAC CATHETERIZATION N/A 06/20/2015   Procedure: Left Heart Cath and Cors/Grafts Angiography;  Surgeon: Ezra GORMAN Shuck, MD;  Location: [facility] INVASIVE CV LAB;  Service: Cardiovascular;  Laterality: N/A;   CARDIAC CATHETERIZATION N/A 11/19/2015   Procedure: Left Heart Cath and Cors/Grafts Angiography;  Surgeon: Peter M Swaziland, MD;  Location: [facility] INVASIVE CV LAB;  Service: Cardiovascular;  Laterality: N/A;   CARDIAC CATHETERIZATION N/A 11/19/2015   Procedure: Coronary Stent Intervention;  Surgeon: Peter M Swaziland, MD;  Location: [facility] INVASIVE CV LAB;  Service: Cardiovascular;  Laterality: N/A;   CATARACT EXTRACTION W/ INTRAOCULAR LENS  IMPLANT, BILATERAL Bilateral 1980s-1990s   CORONARY ANGIOPLASTY WITH STENT PLACEMENT  02/21/2015   1 stent   CORONARY ARTERY BYPASS GRAFT  05/2004   CABG X4   EYE SURGERY  LAPAROSCOPIC CHOLECYSTECTOMY  1990s   MASTECTOMY Left ~ 2012   PERIPHERAL VASCULAR CATHETERIZATION N/A 12/12/2014   Procedure: Abdominal Aortogram;  Surgeon: Deatrice DELENA Cage, MD;  Location: [facility] INVASIVE CV LAB;  Service: Cardiovascular;  Laterality: N/A;   RETINAL DETACHMENT SURGERY Right 1990s   Patient Active Problem List   Diagnosis Date Noted   Trauma 05/01/2022   Rib fractures 04/24/2022   Pain due to onychomycosis of toenails of both feet 04/09/2021   Diabetic neuropathy (HCC) 04/09/2021   Type 2 diabetes mellitus with vascular disease (HCC) 04/09/2021   Obesity (BMI 30-39.9) 01/16/2018   Mixed  hyperlipidemia 09/21/2016   Carotid artery disease 12/03/2015   OSA (obstructive sleep apnea) 12/03/2015   Pain and swelling of left lower leg 11/17/2015   Coronary artery disease    Hypertensive heart disease    Type II diabetes mellitus (HCC)    PAD (peripheral artery disease) 11/03/2014   Chest pain 08/06/2014   Nausea with vomiting 05/08/2014   Allergic reaction 05/08/2014   Hypercholesteremia    Hypertension    Diabetes mellitus (HCC)    Hx of tobacco use, presenting hazards to health 12/21/2013   Varicose veins of left lower extremity 12/18/2013   Cardiac enzymes elevated 03/01/2012   Chronic diastolic heart failure, NYHA class 1 (HCC) 03/01/2012   Bilateral pulmonary embolism (HCC) 02/29/2012   History of breast cancer, DCIS, Left mastectomy 03/21/2009. 03/06/2011   Chest pain 11/30/2010   Left carotid bruit 02/26/2009   CHRONIC LEFT LEG VENOUS INSUFFICIENCY 07/02/2008   EDEMA 05/14/2008   HYPERCHOLESTEROLEMIA 05/10/2008   CAD, ARTERY BYPASS GRAFT 05/10/2008   ESOPHAGITIS, REFLUX 05/10/2008   DM (diabetes mellitus) type I uncontrolled, periph vascular disorder (HCC) 10/18/2006   DEPRESSION 10/18/2006   Essential hypertension 10/18/2006    PCP: Carlin Dale Gull, MD  REFERRING PROVIDER: Darra Macintosh, NP  REFERRING DIAG: C43.61 (ICD-10-CM) - Malignant melanoma of right upper limb, including shoulder I89.0 (ICD-10-CM) - Lymphedema, not elsewhere classified  THERAPY DIAG:  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  EVAL: Every since the surgery especially the last one - my clothes stopped fitting me. My shirts no longer fit this arm. They were very tight. I did not know what was going on. I had not gained any weight until very recently.   PERTINENT HISTORY: History of melanoma as follows: - 12/26/19 presented with a lesion on R posterior upper arm to dermatology s/p shave biopsy with path showing lentiginous melanoma at least 2.33mm deep, ulcerated, 02/06/20 s/p WLE and SLB with residual melanoma in situ at the excision, margins free, 0/2 R axillary LNs negative for melanoma. Staged as pT3bN0. - 12/10/20 shave biopsy of R forearm showed superficial spreading melanoma, at least 1.18mm thick, margins extended to base. - 01/28/21 excision of a R radial distal forearm melanoma with no residual melanoma. Non-migratory SLN. Staged as pT2aNX. BRAF negative again. - 08/19/21 presented for routine follow-up with a 2cm R axillary LN was palpated.  - 08/28/21 R axillary core needle biopsy with path revealing metastatic melanoma at least 0.2cm, non-brisk TILs, positive margins, focal necrosis present. LAG3 highlights about 10% of cells. - 09/01/21 PET-CT showed 3cm R axillary LN with subcentimeter RUL lung nodule - 09/12/21 MRI brain negative for metastatic disease- 09/18/21-02/11/22 neoadjuvant pembrolizumab  - 11/11/21 PET-CT after 4 cycles showed response in R axilla but still persistent disease, new rectal thickening and uptake- 02/11/22 PET-CT showed a near complete response to NAJ therapy with resolution of disease in R axilla, still a bit of rectal thickening compared to prior - 03/05/22 limited R axillary LN dissection with path revealing 1.4cm deposit of metastatic melanoma presenting in 1/3 LNs, showing 100% necrosis with no viable tumor in extracapsular tissue,  margins uninvolved. Also has history of coronary artery disease status post bypass grafting in 2006, PCI in 2015 and 2018 peripheral vascular disease, pulmonary embolus, history of breast cancer, hypertension, hyperlipidemia, carotid artery disease, and diabetes mellitus.  She been followed by Dr. Alveta since 2019. She underwent bypass surgery in 2006 and stent placements in 2015 and 2018. History of L breast cancer and underwent L mastectomy - pt thinks this was around 2011 with 1 node removed and it was negative. Has blood clot behind knee but unable to stent due to location. Going to have doppler repeated. Has rotator cuff tear in R shoulder but pt does not want surgery  PAIN:  Are you having pain? No NPRS scale: 0/10 Pain location: lower back and L leg Pain orientation: Left  PAIN TYPE: aching Pain description: constant  Aggravating factors: getting up from chair Relieving factors: moving around  PRECAUTIONS: Other: at risk for lymphedema bilaterally  RED FLAGS: PVD, s/p bypass in 2006, hx of blood clots, current blood clot behind knee    WEIGHT BEARING RESTRICTIONS: No  FALLS:  Has patient fallen in last 6 months? Yes. 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  COGNITION: Overall cognitive status: Within functional limits for tasks  assessed   PALPATION: Thickness palpable in medial upper arm bilaterally with R worse than L  OBSERVATIONS / OTHER ASSESSMENTS: L forearm appears swollen and R upper arm appears swollen, difficulty getting up from chair and difficulty crossing legs due to tightness, redness at medial R upper arm  POSTURE: rounded shoulders, forward head  UPPER EXTREMITY AROM/PROM:  A/PROM RIGHT   eval   Shoulder extension   Shoulder flexion 118  Shoulder abduction 87  Shoulder internal rotation   Shoulder external rotation     (Blank rows = not tested)  A/PROM LEFT   eval  Shoulder extension   Shoulder flexion 140  Shoulder abduction 129  Shoulder internal rotation   Shoulder external rotation     (Blank rows = not tested)  LYMPHEDEMA ASSESSMENTS:  SURGERY TYPE/DATE: 2011 (per pt) L mastectomy and SLNB, also numerous surgeries for melanoma on R UE. 12/26/19 s/p shave biopsy R posterior upper arm, 02/06/20 s/p WLE and SLB with residual melanoma in situ at the excision, margins free, 0/2 R axillary LNs negative for melanoma. 12/10/20 shave biopsy of R forearm showed superficial spreading melanoma- 01/28/21 excision of a R radial distal forearm melanoma with no residual melanoma. Non-migratory SLN.- 08/28/21 R axillary core needle biopsy with path revealing metastatic melanoma at least 0.2cm, non-brisk TILs, positive margins, focal necrosis present. 03/05/22 limited R axillary LN dissection with path revealing 1.4cm deposit of metastatic melanoma presenting in 1/3 LNs, showing 100% necrosis with no viable tumor in extracapsular tissue, margins uninvolved.   NUMBER OF LYMPH NODES REMOVED: 0/1 on L, at least 5 nodes removed from R  CHEMOTHERAPY: completed for melanoma   LYMPHEDEMA ASSESSMENTS:   LANDMARK RIGHT  eval RIGHT  11/01/23  At axilla  35.5 35.5  15 cm proximal to the proximal aspect of the olecranon process 36.2 35.2  10 cm proximal to the proximal aspect of the olecranon process 36.5 35   Olecranon process 30.5 30.8  15 cm proximal to the proximal aspect of the ulnar styloid process 27.4 27.7  10 cm proximal to the proximal aspect of the ulnar styloid process 23 24  Just distal to the ulnar styloid process 17.2 17.2  Across hand at thumb web space 20.5 19.4  At base of 2nd digit 7 6.9  (Blank rows = not tested)  LANDMARK LEFT  eval  At axilla  37.5  15 cm proximal to the proximal aspect of the olecranon process 39  10 cm proximal to the proximal aspect of the olecranon process 38.2  Olecranon process 29.5  15 cm proximal to the proximal aspect of the ulnar styloid process 28.1  10 cm proximal to  the proximal aspect of the ulnar styloid process 23.6  Just distal to the ulnar styloid process 17.1  Across hand at thumb web space 20.1  At base of 2nd digit 7  (Blank rows = not tested)  Rt volume:   eval:  Lt :   Eval:   Chest circumference just inferior to the axillae:  Chest circumference at the largest point:     LLIS:  Flowsheet Row Outpatient Rehab from 10/26/2023 in [facility]  Lymphedema Life Impact Scale Total Score 51.47 %    TODAY'S TREATMENT 11/01/23 Re-measured circumference for changes in lymphedema measurements following compression bandaging Patient education about getting a compression sleeve vs bandaging due to no significant improvements after multiple sessions of compression bandaging In  supine: Short neck, superficial and deep abdominals (careful near Rt ribcage where pt had recent fracture) Rt axillary nodes and R inguinal nodes and establishment of axilloinguinal pathway, then R UE working proximal to distal, moving inner upper arm outwards and upwards, and doing both sides of forearms, spending extra time in any areas of fibrosis then retracing all steps Will order M4 sleeves and pt was given prescription to sign in person.   10/13/23 Reviewed importance of not sticking anything in the bandage to  itch especially with her fragile skin.  She had a bandaid on the Rt base of the thumb and some possible new spots of ecchymosis but she has a lot of these already so it was hard to tell which were new.   In supine: Short neck, superficial and deep abdominals (careful near Rt ribcage where pt had recent fracture) Rt axillary nodes and R inguinal nodes and establishment of axilloinguinal pathway, then R UE working proximal to distal, moving inner upper arm outwards and upwards, and doing both sides of forearms, spending extra time in any areas of fibrosis then retracing all steps Then seated application of bandage: eucerin lotion, tg soft size medium, artiflex from hand to axilla, 6cm hand with lighter application due to minimal swelling, 8cm starting at wrist with X at elbow, then 10cm wrist to axilla  10/27/23 Education on CDT, bandaging, when to remove, how to wash, etc In supine: Short neck, superficial and deep abdominals (careful near Rt ribcage where pt had recent fracture) Rt axillary nodes and R inguinal nodes and establishment of axilloinguinal pathway, then R UE working proximal to distal, moving inner upper arm outwards and upwards, and doing both sides of forearms, spending extra time in any areas of fibrosis then retracing all steps Then seated application of bandage: eucerin lotion, tg soft size medium, artiflex from hand to axilla, 6cm hand with lighter application due to minimal swelling, 8cm starting at wrist with X at elbow, then 10cm wrist to axilla    HOME EXERCISE PROGRAM: Can try piriformis stretch by crossing ankle at opposite knee  ASSESSMENT:  CLINICAL IMPRESSION: Continued MLD for the RT arm which pt tolerated well. Right arm circumference is re-measured with minimal changes in circumference noted. Patient education is provided regarding discontinuing compression bandaging for the time unless swelling increases, and discussed ordering compression garments for bilateral Ue's.  Visits are decreased to once a week until the compression garments arrive since compression bandaging is no longer required. Patient would benefit from a class 1 circular knit compression sleeve for both arms due to her stage 2 bilateral arm lymphedema. Patient would benefit from 2 per each arm in order to have one to wash and one to wear. Patient would benefit from continued skilled physical therapy for UE MLD.   OBJECTIVE IMPAIRMENTS: decreased knowledge of condition, decreased knowledge of use of DME, decreased mobility, difficulty walking, decreased ROM, increased edema, increased fascial restrictions, impaired UE functional use, postural dysfunction, and pain.   ACTIVITY LIMITATIONS: carrying, lifting, bending, sitting, standing, squatting, sleeping, stairs, transfers, bed mobility, and reach over head  PARTICIPATION LIMITATIONS: meal prep, cleaning, laundry, shopping, community activity, and yard work  PERSONAL FACTORS: Age, Fitness, Past/current experiences, Time since onset of injury/illness/exacerbation, and 3+ comorbidities: hx of coronary artery disease, pulmonary embolus, hx of breast cancer, carotid artery disease and diabetes, stent placement  are also affecting patient's functional outcome.   REHAB POTENTIAL: Good  CLINICAL DECISION MAKING: Evolving/moderate complexity  EVALUATION COMPLEXITY: Moderate  GOALS:  Goals reviewed with patient? Yes  SHORT TERM GOALS: Target date: 11/16/23  Pt will demonstrate a 1 cm reduction of lymphedema 15 cm proximal to R olecranon to decrease risk of cellulitis.  Baseline: Goal status: DEFERRED  2.  Pt will obtain appropriate compression sleeve for long term management of RUE lymphedema. Baseline:  Goal status: INITIAL  3.  Pt will demonstrate 95 degrees of R shoulder abduction to allow her to reach out to the side. Baseline:  Goal status: INITIAL  4.  Pt and or family member will be able to independently don compression bandaging for  management of lymphedema.  Baseline:  Goal status: DEFERRED   LONG TERM GOALS: Target date: 12/07/23  Pt will obtain compression garments for long term management of lymphedema. Baseline:  Goal status: INITIAL  2.  Pt will be independent in self MLD for long term management of lymphedema. Baseline:  Goal status: INITIAL  3.  Pt will report a 75% improvement in L LE pain once cleared by dr to do PT for this to allow improved function. Baseline:  Goal status: INITIAL  4.  Pt will repot 50% improvement in feelings of heaviness in bilateral UEs to allow improved comfort.  Baseline:  Goal status: INITIAL  5.  Pt will be independent in a home exercise program for continued stretching and strengthening.  Baseline:  Goal status: INITIAL   PLAN:  PT FREQUENCY: 3x/week  PT DURATION: 6 weeks  PLANNED INTERVENTIONS: [id number]- PT Re-evaluation, 97110-Therapeutic exercises, 97530- Therapeutic activity, 97112- Neuromuscular re-education, 97535- Self Care, [id number]- Manual therapy, [phone]- Orthotic Initial, [phone]- Orthotic/Prosthetic subsequent, Patient/Family education, Balance training, Joint mobilization, Therapeutic exercises, Therapeutic activity, Neuromuscular re-education, Gait training, and Self Care  PLAN FOR NEXT SESSION: Measure Rt arm after the week of no bandaging, continue MLD - teach patient -  can work on L LE once cleared by doctor, PROM for bilateral shoulders - can also do pulleys or ball Entering order on new abilico  Randall Pack, SPT 11/01/2023, 11:57 AM  I agree with the following treatment note after reviewing documentation. This session was performed under the supervision of a licensed clinician.  Saddie Raw, PT 11/01/23, 1:11 PM

## 2023-11-02 DIAGNOSIS — Z23 Encounter for immunization: Secondary | ICD-10-CM | POA: Diagnosis not present

## 2023-11-02 LAB — VAS US ABI WITH/WO TBI
Left ABI: 0.48
Right ABI: 0.67

## 2023-11-03 ENCOUNTER — Ambulatory Visit: Admitting: Physical Therapy

## 2023-11-04 ENCOUNTER — Ambulatory Visit: Payer: Self-pay | Admitting: Physician Assistant

## 2023-11-05 ENCOUNTER — Ambulatory Visit

## 2023-11-08 ENCOUNTER — Ambulatory Visit

## 2023-11-08 DIAGNOSIS — I739 Peripheral vascular disease, unspecified: Secondary | ICD-10-CM | POA: Diagnosis not present

## 2023-11-08 DIAGNOSIS — I10 Essential (primary) hypertension: Secondary | ICD-10-CM | POA: Diagnosis not present

## 2023-11-08 DIAGNOSIS — E1142 Type 2 diabetes mellitus with diabetic polyneuropathy: Secondary | ICD-10-CM | POA: Diagnosis not present

## 2023-11-08 DIAGNOSIS — N183 Chronic kidney disease, stage 3 unspecified: Secondary | ICD-10-CM | POA: Diagnosis not present

## 2023-11-10 ENCOUNTER — Ambulatory Visit: Admitting: Physical Therapy

## 2023-11-12 ENCOUNTER — Ambulatory Visit: Admitting: Rehabilitation

## 2023-11-12 DIAGNOSIS — E113292 Type 2 diabetes mellitus with mild nonproliferative diabetic retinopathy without macular edema, left eye: Secondary | ICD-10-CM | POA: Diagnosis not present

## 2023-11-12 DIAGNOSIS — E7800 Pure hypercholesterolemia, unspecified: Secondary | ICD-10-CM | POA: Diagnosis not present

## 2023-11-12 DIAGNOSIS — E039 Hypothyroidism, unspecified: Secondary | ICD-10-CM | POA: Diagnosis not present

## 2023-11-12 DIAGNOSIS — N183 Chronic kidney disease, stage 3 unspecified: Secondary | ICD-10-CM | POA: Diagnosis not present

## 2023-11-15 ENCOUNTER — Ambulatory Visit: Admitting: Rehabilitation

## 2023-11-15 ENCOUNTER — Encounter: Payer: Self-pay | Admitting: Radiology

## 2023-11-17 ENCOUNTER — Ambulatory Visit: Attending: Nurse Practitioner

## 2023-11-17 DIAGNOSIS — C4361 Malignant melanoma of right upper limb, including shoulder: Secondary | ICD-10-CM | POA: Diagnosis not present

## 2023-11-17 DIAGNOSIS — M25612 Stiffness of left shoulder, not elsewhere classified: Secondary | ICD-10-CM | POA: Diagnosis not present

## 2023-11-17 DIAGNOSIS — M25611 Stiffness of right shoulder, not elsewhere classified: Secondary | ICD-10-CM | POA: Diagnosis not present

## 2023-11-17 DIAGNOSIS — I972 Postmastectomy lymphedema syndrome: Secondary | ICD-10-CM | POA: Diagnosis not present

## 2023-11-17 DIAGNOSIS — I890 Lymphedema, not elsewhere classified: Secondary | ICD-10-CM | POA: Insufficient documentation

## 2023-11-17 DIAGNOSIS — R293 Abnormal posture: Secondary | ICD-10-CM | POA: Insufficient documentation

## 2023-11-17 NOTE — Therapy (Addendum)
 OUTPATIENT PHYSICAL THERAPY  UPPER EXTREMITY ONCOLOGY  Patient Name: JULIUS BONIFACE MRN: [id number] DOB:1945/06/16, 78 y.o., female Today's Date: 11/17/2023  END OF SESSION:  PT End of Session - 11/17/23 1113     Visit Number 5    Number of Visits 19    Date for Recertification  12/07/23    PT Start Time 1111   pt arrived late   PT Stop Time 1202    PT Time Calculation (min) 51 min    Activity Tolerance Patient tolerated treatment well    Behavior During Therapy [facility] for tasks assessed/performed             Past Medical History:  Diagnosis Date   Cancer of left breast (HCC)    Complication of anesthesia    Coronary artery disease    a. 2006 s/p CABG x 4 (LIMA->LAD, VG->Diag->OM, VG->RPDA);  b. 02/2015: [place]  s/p DES to [facility], VG->PDA & VG->D1->OM 100; c. 06/2015 Cath: diffuse dzs->Med rx; d. 11/2015 Cath/PCI: LM nl, LAD 100ost, 95d, D1 90ost, LCX 35ost, 65p, OM2 40, lat OM2 90, RCA 95p (2.75x16 Synergy DES), patent mid stent, RPLB 70, LIMA->LAD ok.EF55-65%.   Depression    Hx of tobacco use, presenting hazards to health    a. quit 2006.   Hypercholesteremia    Hypertension    Hypertensive heart disease    Echo 04/2019: Inferior HK, EF 55, normal RV SF, RVSP normal at 29.2, mild BAE, trivial MR, mild aortic valve sclerosis without stenosis   Left carotid bruit    a. 09/2014 Carotid U/S: 1-39% bilat ICA stenosis.   PAD (peripheral artery disease)    a. 11/2014 ABI: R: 0.63, L 0.59;  b. 11/2014 Periph Angio: bilat pop occlusions. L - short w/ reconstituion via collats in dist pop w/ 2 vessel runoff, R long w/ reconstitution in prox tib/peroneal arteries-->med Rx w/ pletal .   Pulmonary embolism (HCC) a. 2014.   Reflux esophagitis    Type II diabetes mellitus (HCC)    Past Surgical History:  Procedure Laterality Date   BREAST BIOPSY Left ~ 2012   BUNIONECTOMY Left 1970s   CARDIAC CATHETERIZATION  04/2004   CARDIAC CATHETERIZATION N/A 02/21/2015   Procedure: Left Heart Cath and  Cors/Grafts Angiography;  Surgeon: Victory LELON Sharps, MD;  Location: [facility] INVASIVE CV LAB;  Service: Cardiovascular;  Laterality: N/A;   CARDIAC CATHETERIZATION N/A 02/21/2015   Procedure: Coronary Stent Intervention;  Surgeon: Victory LELON Sharps, MD;  Location: [facility] INVASIVE CV LAB;  Service: Cardiovascular;  Laterality: N/A;   CARDIAC CATHETERIZATION N/A 02/21/2015   Procedure: Intravascular Pressure Wire/FFR Study;  Surgeon: Victory LELON Sharps, MD;  Location: [facility] INVASIVE CV LAB;  Service: Cardiovascular;  Laterality: N/A;   CARDIAC CATHETERIZATION N/A 06/20/2015   Procedure: Left Heart Cath and Cors/Grafts Angiography;  Surgeon: Ezra GORMAN Shuck, MD;  Location: [facility] INVASIVE CV LAB;  Service: Cardiovascular;  Laterality: N/A;   CARDIAC CATHETERIZATION N/A 11/19/2015   Procedure: Left Heart Cath and Cors/Grafts Angiography;  Surgeon: Peter M Jordan, MD;  Location: [facility] INVASIVE CV LAB;  Service: Cardiovascular;  Laterality: N/A;   CARDIAC CATHETERIZATION N/A 11/19/2015   Procedure: Coronary Stent Intervention;  Surgeon: Peter M Jordan, MD;  Location: [facility] INVASIVE CV LAB;  Service: Cardiovascular;  Laterality: N/A;   CATARACT EXTRACTION W/ INTRAOCULAR LENS  IMPLANT, BILATERAL Bilateral 1980s-1990s   CORONARY ANGIOPLASTY WITH STENT PLACEMENT  02/21/2015   1 stent   CORONARY ARTERY BYPASS GRAFT  05/2004   CABG X4  EYE SURGERY     LAPAROSCOPIC CHOLECYSTECTOMY  1990s   MASTECTOMY Left ~ 2012   PERIPHERAL VASCULAR CATHETERIZATION N/A 12/12/2014   Procedure: Abdominal Aortogram;  Surgeon: Deatrice DELENA Cage, MD;  Location: [facility] INVASIVE CV LAB;  Service: Cardiovascular;  Laterality: N/A;   RETINAL DETACHMENT SURGERY Right 1990s   Patient Active Problem List   Diagnosis Date Noted   Trauma 05/01/2022   Rib fractures 04/24/2022   Pain due to onychomycosis of toenails of both feet 04/09/2021   Diabetic neuropathy (HCC) 04/09/2021   Type 2 diabetes mellitus with vascular disease (HCC) 04/09/2021   Obesity (BMI 30-39.9)  01/16/2018   Mixed hyperlipidemia 09/21/2016   Carotid artery disease 12/03/2015   OSA (obstructive sleep apnea) 12/03/2015   Pain and swelling of left lower leg 11/17/2015   Coronary artery disease    Hypertensive heart disease    Type II diabetes mellitus (HCC)    PAD (peripheral artery disease) 11/03/2014   Chest pain 08/06/2014   Nausea with vomiting 05/08/2014   Allergic reaction 05/08/2014   Hypercholesteremia    Hypertension    Diabetes mellitus (HCC)    Hx of tobacco use, presenting hazards to health 12/21/2013   Varicose veins of left lower extremity 12/18/2013   Cardiac enzymes elevated 03/01/2012   Chronic diastolic heart failure, NYHA class 1 (HCC) 03/01/2012   Bilateral pulmonary embolism (HCC) 02/29/2012   History of breast cancer, DCIS, Left mastectomy 03/21/2009. 03/06/2011   Chest pain 11/30/2010   Left carotid bruit 02/26/2009   CHRONIC LEFT LEG VENOUS INSUFFICIENCY 07/02/2008   EDEMA 05/14/2008   HYPERCHOLESTEROLEMIA 05/10/2008   CAD, ARTERY BYPASS GRAFT 05/10/2008   ESOPHAGITIS, REFLUX 05/10/2008   DM (diabetes mellitus) type I uncontrolled, periph vascular disorder (HCC) 10/18/2006   DEPRESSION 10/18/2006   Essential hypertension 10/18/2006    PCP: Carlin Dale Gull, MD  REFERRING PROVIDER: Darra Macintosh, NP  REFERRING DIAG: C43.61 (ICD-10-CM) - Malignant melanoma of right upper limb, including shoulder I89.0 (ICD-10-CM) - Lymphedema, not elsewhere classified  THERAPY DIAG:  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 I was able to  mostly get the sleeve on my Lt arm, but I couldn't get it on my Rt.   EVAL: Every since the surgery especially the last one - my clothes stopped fitting me. My shirts no longer fit this arm. They were very tight. I did not know what was going on. I had not gained any weight until very recently.   PERTINENT HISTORY: History of melanoma as follows: - 12/26/19 presented with a lesion on R posterior upper arm to dermatology s/p shave biopsy with path showing lentiginous melanoma at least 2.5mm deep, ulcerated, 02/06/20 s/p WLE and SLB with residual melanoma in situ at the excision, margins free, 0/2 R axillary LNs negative for melanoma. Staged as pT3bN0. - 12/10/20 shave biopsy of R forearm showed superficial spreading melanoma, at least 1.71mm thick, margins extended to base. - 01/28/21 excision of a R radial distal forearm melanoma with no residual melanoma. Non-migratory SLN. Staged as pT2aNX. BRAF negative again. - 08/19/21 presented for routine follow-up with a 2cm R axillary LN was palpated.  - 08/28/21 R axillary core needle biopsy with path revealing metastatic melanoma at least 0.2cm, non-brisk TILs, positive margins, focal necrosis present. LAG3 highlights about 10% of cells. - 09/01/21 PET-CT showed 3cm R axillary LN with subcentimeter RUL lung nodule - 09/12/21 MRI brain negative for metastatic disease- 09/18/21-02/11/22 neoadjuvant pembrolizumab  - 11/11/21 PET-CT after 4 cycles showed response in R axilla but still persistent disease, new rectal thickening and uptake- 02/11/22 PET-CT showed a near complete response to NAJ therapy with resolution of disease in R axilla, still a bit of rectal thickening compared to prior - 03/05/22 limited R axillary LN dissection with path revealing 1.4cm deposit of metastatic melanoma presenting in 1/3 LNs, showing 100%  necrosis with no viable tumor in extracapsular tissue, margins uninvolved. Also has history of coronary artery disease status post bypass grafting in 2006, PCI in 2015 and 2018 peripheral vascular disease, pulmonary embolus, history of breast cancer, hypertension, hyperlipidemia, carotid artery disease, and diabetes mellitus.  She been followed by Dr. Alveta since 2019. She underwent bypass surgery in 2006 and stent placements in 2015 and 2018. History of L breast cancer and underwent L mastectomy - pt thinks this was around 2011 with 1 node removed and it was negative. Has blood clot behind knee but unable to stent due to location. Going to have doppler repeated. Has rotator cuff tear in R shoulder but pt does not want surgery  PAIN:  Are you having pain? No NPRS scale: 0/10 Pain location: lower back and L leg Pain orientation: Left  PAIN TYPE: aching Pain description: constant  Aggravating factors: getting up from chair Relieving factors: moving around  PRECAUTIONS: Other: at risk for lymphedema bilaterally  RED FLAGS: PVD, s/p bypass in 2006, hx of blood clots, current blood clot behind knee    WEIGHT BEARING RESTRICTIONS: No  FALLS:  Has patient fallen in last 6 months? Yes. 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  COGNITION: Overall cognitive  status: Within functional limits for tasks assessed   PALPATION: Thickness palpable in medial upper arm bilaterally with R worse than L  OBSERVATIONS / OTHER ASSESSMENTS: L forearm appears swollen and R upper arm appears swollen, difficulty getting up from chair and difficulty crossing legs due to tightness, redness at medial R upper arm  POSTURE: rounded shoulders, forward head  UPPER EXTREMITY AROM/PROM:  A/PROM RIGHT   eval   Shoulder extension   Shoulder flexion 118  Shoulder abduction 87  Shoulder internal rotation   Shoulder external rotation     (Blank rows = not tested)  A/PROM LEFT   eval  Shoulder extension   Shoulder flexion 140  Shoulder abduction 129  Shoulder internal rotation   Shoulder external rotation     (Blank rows = not tested)  LYMPHEDEMA ASSESSMENTS:  SURGERY TYPE/DATE: 2011 (per pt) L mastectomy and SLNB, also numerous surgeries for melanoma on R UE. 12/26/19 s/p shave biopsy R posterior upper arm, 02/06/20 s/p WLE and SLB with residual melanoma in situ at the excision, margins free, 0/2 R axillary LNs negative for melanoma. 12/10/20 shave biopsy of R forearm showed superficial spreading melanoma- 01/28/21 excision of a R radial distal forearm melanoma with no residual melanoma. Non-migratory SLN.- 08/28/21 R axillary core needle biopsy with path revealing metastatic melanoma at least 0.2cm, non-brisk TILs, positive margins, focal necrosis present. 03/05/22 limited R axillary LN dissection with path revealing 1.4cm deposit of metastatic melanoma presenting in 1/3 LNs, showing 100% necrosis with no viable tumor in extracapsular tissue, margins uninvolved.   NUMBER OF LYMPH NODES REMOVED: 0/1 on L, at least 5 nodes removed from R  CHEMOTHERAPY: completed for melanoma   LYMPHEDEMA ASSESSMENTS:   LANDMARK RIGHT  eval RIGHT  11/01/23 Right 11/17/23  At axilla  35.5 35.5 34.9  15 cm proximal to the proximal aspect of the olecranon process 36.2 35.2 33.4  10  cm proximal to the proximal aspect of the olecranon process 36.5 35 33.7  Olecranon process 30.5 30.8 29  15  cm proximal to the proximal aspect of the ulnar styloid process 27.4 27.7 27.6  10 cm proximal to the proximal aspect of the ulnar styloid process 23 24 24.4  Just distal to the ulnar styloid process 17.2 17.2 16.1  Across hand at thumb web space 20.5 19.4 18.4  At base of 2nd digit 7 6.9 6.3  (Blank rows = not tested)  LANDMARK LEFT  eval  At axilla  37.5  15 cm proximal to the proximal aspect of the olecranon process 39  10 cm proximal to the proximal aspect of the olecranon process 38.2  Olecranon process 29.5  15 cm proximal to the proximal aspect of the ulnar styloid process 28.1  10 cm proximal to  the proximal aspect of the ulnar styloid process 23.6  Just distal to the ulnar styloid process 17.1  Across hand at thumb web space 20.1  At base of 2nd digit 7  (Blank rows = not tested)  Rt volume:   eval:  Lt :   Eval:   Chest circumference just inferior to the axillae:  Chest circumference at the largest point:     LLIS:  Flowsheet Row Outpatient Rehab from 10/26/2023 in [facility]  Lymphedema Life Impact Scale Total Score 51.47 %    TODAY'S TREATMENT 11/17/23: Orthotic  Initial Donned pts Rt UE compression sleeve and spent time educating her and sister how to roll sleeve down about half way before pulling on. Then doffed sleeve and had pt return demo. Educated her on importance or sleeve being spread out evenly on arm throughout day instead of just pulling it up from the top as it moves on her arm during the day and how rubber type gloves (gardening or kitchen) can assist her with this without having to pull it off and start over. Pt was able to return correct demo and was also encouraged to hold her doorframe for stability of the UE that she's pulling the sleeve onto.  Therapeutic Exercise Pulleys into flex x 4 mins and abd  x 3 mins with demo, tactile and VC's throughout to work on decreasing Rt scapular compensation which pt struggles with due to protective posture Tried rolling yellow ball up wall into flex x 5 reps but pt struggled with technique so switched to rolling larger green ball on mat table into flexion x 10, pt with slow return of demo and requires increased VC's/encouragement that she is performing exercises correctly Manual Therapy P/ROM to Rt shoulder into flex, scaption and D2 with scapular depression throughout by therapist, pt very limited with scapular depression and mindful of pts pain during due to RTC tear  11/01/23 Re-measured circumference for changes in lymphedema measurements following compression bandaging Patient education about getting a compression sleeve vs bandaging due to no significant improvements after multiple sessions of compression bandaging In supine: Short neck, superficial and deep abdominals (careful near Rt ribcage where pt had recent fracture) Rt axillary nodes and R inguinal nodes and establishment of axilloinguinal pathway, then R UE working proximal to distal, moving inner upper arm outwards and upwards, and doing both sides of forearms, spending extra time in any areas of fibrosis then retracing all steps Will order M4 sleeves and pt was given prescription to sign in person.   10/13/23 Reviewed importance of not sticking anything in the bandage to itch especially with her fragile skin.  She had a bandaid on the Rt base of the thumb and some possible new spots of ecchymosis but she has a lot of these already so it was hard to tell which were new.   In supine: Short neck, superficial and deep abdominals (careful near Rt ribcage where pt had recent fracture) Rt axillary nodes and R inguinal nodes and establishment of axilloinguinal pathway, then R UE working proximal to distal, moving inner upper arm outwards and upwards, and doing both sides of forearms, spending extra time in  any areas of fibrosis then retracing all steps Then seated application of bandage: eucerin lotion, tg soft size medium, artiflex from hand to axilla, 6cm hand with lighter application due to minimal swelling, 8cm starting at wrist with X at elbow, then 10cm wrist to axilla  10/27/23 Education on CDT, bandaging, when to remove, how to wash, etc In supine: Short neck, superficial and deep abdominals (careful near Rt ribcage where pt had recent fracture) Rt axillary nodes and R inguinal nodes and establishment of axilloinguinal pathway, then R UE working proximal to distal, moving inner upper arm outwards and upwards, and doing both sides of forearms, spending extra time in any areas of fibrosis then retracing all steps Then seated application of bandage: eucerin lotion, tg soft size medium, artiflex from hand to axilla, 6cm hand with lighter application due to minimal swelling, 8cm starting at wrist with X at elbow, then 10cm  wrist to axilla    HOME EXERCISE PROGRAM: Can try piriformis stretch by crossing ankle at opposite knee  ASSESSMENT:  CLINICAL IMPRESSION: Pt brought her new compression sleeve so spent time educating pt and sister in proper donning and doffing techniques, see above. Then began AA/ROM exs and continued with manual therapies as time allowed.   OBJECTIVE IMPAIRMENTS: decreased knowledge of condition, decreased knowledge of use of DME, decreased mobility, difficulty walking, decreased ROM, increased edema, increased fascial restrictions, impaired UE functional use, postural dysfunction, and pain.   ACTIVITY LIMITATIONS: carrying, lifting, bending, sitting, standing, squatting, sleeping, stairs, transfers, bed mobility, and reach over head  PARTICIPATION LIMITATIONS: meal prep, cleaning, laundry, shopping, community activity, and yard work  PERSONAL FACTORS: Age, Fitness, Past/current experiences, Time since onset of injury/illness/exacerbation, and 3+ comorbidities: hx of  coronary artery disease, pulmonary embolus, hx of breast cancer, carotid artery disease and diabetes, stent placement  are also affecting patient's functional outcome.   REHAB POTENTIAL: Good  CLINICAL DECISION MAKING: Evolving/moderate complexity  EVALUATION COMPLEXITY: Moderate  GOALS: Goals reviewed with patient? Yes  SHORT TERM GOALS: Target date: 11/16/23  Pt will demonstrate a 1 cm reduction of lymphedema 15 cm proximal to R olecranon to decrease risk of cellulitis.  Baseline: Goal status: DEFERRED  2.  Pt will obtain appropriate compression sleeve for long term management of RUE lymphedema. Baseline:  Goal status: INITIAL  3.  Pt will demonstrate 95 degrees of R shoulder abduction to allow her to reach out to the side. Baseline:  Goal status: INITIAL  4.  Pt and or family member will be able to independently don compression bandaging for management of lymphedema.  Baseline:  Goal status: DEFERRED   LONG TERM GOALS: Target date: 12/07/23  Pt will obtain compression garments for long term management of lymphedema. Baseline:  Goal status: INITIAL  2.  Pt will be independent in self MLD for long term management of lymphedema. Baseline:  Goal status: INITIAL  3.  Pt will report a 75% improvement in L LE pain once cleared by dr to do PT for this to allow improved function. Baseline:  Goal status: INITIAL  4.  Pt will repot 50% improvement in feelings of heaviness in bilateral UEs to allow improved comfort.  Baseline:  Goal status: INITIAL  5.  Pt will be independent in a home exercise program for continued stretching and strengthening.  Baseline:  Goal status: INITIAL   PLAN:  PT FREQUENCY: 3x/week  PT DURATION: 6 weeks  PLANNED INTERVENTIONS: [id number]- PT Re-evaluation, 97110-Therapeutic exercises, 97530- Therapeutic activity, 97112- Neuromuscular re-education, 97535- Self Care, [id number]- Manual therapy, [phone]- Orthotic Initial, [phone]- Orthotic/Prosthetic  subsequent, Patient/Family education, Balance training, Joint mobilization, Therapeutic exercises, Therapeutic activity, Neuromuscular re-education, Gait training, and Self Care  PLAN FOR NEXT SESSION: Measure Rt arm after the week of no bandaging, continue MLD - teach patient -  can work on L LE once cleared by doctor, PROM for bilateral shoulders - can also do pulleys or ball Entered new order on [facility] 11/05/23  Berwyn Knights, PTA 11/17/23 1:15 PM

## 2023-11-19 ENCOUNTER — Ambulatory Visit

## 2023-11-22 ENCOUNTER — Ambulatory Visit

## 2023-11-23 ENCOUNTER — Ambulatory Visit: Admitting: Cardiovascular Disease

## 2023-11-24 ENCOUNTER — Ambulatory Visit

## 2023-11-24 DIAGNOSIS — I890 Lymphedema, not elsewhere classified: Secondary | ICD-10-CM

## 2023-11-24 DIAGNOSIS — C4361 Malignant melanoma of right upper limb, including shoulder: Secondary | ICD-10-CM

## 2023-11-24 DIAGNOSIS — R293 Abnormal posture: Secondary | ICD-10-CM

## 2023-11-24 DIAGNOSIS — I972 Postmastectomy lymphedema syndrome: Secondary | ICD-10-CM

## 2023-11-24 DIAGNOSIS — M25611 Stiffness of right shoulder, not elsewhere classified: Secondary | ICD-10-CM | POA: Diagnosis not present

## 2023-11-24 DIAGNOSIS — M25612 Stiffness of left shoulder, not elsewhere classified: Secondary | ICD-10-CM | POA: Diagnosis not present

## 2023-11-24 NOTE — Therapy (Signed)
 OUTPATIENT PHYSICAL THERAPY  UPPER EXTREMITY ONCOLOGY  Patient Name: Taylor [name] MRN: [id number] DOB:[birth year], 78 y.o., female Today's Date: 11/24/2023  END OF SESSION:  PT End of Session - 11/24/23 1202     Visit Number 6    Number of Visits 19    Date for Recertification  12/07/23    PT Start Time 1104    PT Stop Time 1158    PT Time Calculation (min) 54 min    Activity Tolerance Patient tolerated treatment well    Behavior During Therapy [facility] for tasks assessed/performed              Past Medical History:  Diagnosis Date   Cancer of left breast (HCC)    Complication of anesthesia    Coronary artery disease    a. 2006 s/p CABG x 4 (LIMA->LAD, VG->Diag->OM, VG->RPDA);  b. 02/2015: [place]  s/p DES to [facility], VG->PDA & VG->D1->OM 100; c. 06/2015 Cath: diffuse dzs->Med rx; d. 11/2015 Cath/PCI: LM nl, LAD 100ost, 95d, D1 90ost, LCX 35ost, 65p, OM2 40, lat OM2 90, RCA 95p (2.75x16 Synergy DES), patent mid stent, RPLB 70, LIMA->LAD ok.EF55-65%.   Depression    Hx of tobacco use, presenting hazards to health    a. quit 2006.   Hypercholesteremia    Hypertension    Hypertensive heart disease    Echo 04/2019: Inferior HK, EF 55, normal RV SF, RVSP normal at 29.2, mild BAE, trivial MR, mild aortic valve sclerosis without stenosis   Left carotid bruit    a. 09/2014 Carotid U/S: 1-39% bilat ICA stenosis.   PAD (peripheral artery disease)    a. 11/2014 ABI: R: 0.63, L 0.59;  b. 11/2014 Periph Angio: bilat pop occlusions. L - short w/ reconstituion via collats in dist pop w/ 2 vessel runoff, R long w/ reconstitution in prox tib/peroneal arteries-->med Rx w/ pletal .   Pulmonary embolism (HCC) a. 2014.   Reflux esophagitis    Type II diabetes mellitus (HCC)    Past Surgical History:  Procedure Laterality Date   BREAST BIOPSY Left ~ 2012   BUNIONECTOMY Left 1970s   CARDIAC CATHETERIZATION  04/2004   CARDIAC CATHETERIZATION N/A 02/21/2015   Procedure: Left Heart Cath and Cors/Grafts  Angiography;  Surgeon: Victory LELON Sharps, MD;  Location: [facility] INVASIVE CV LAB;  Service: Cardiovascular;  Laterality: N/A;   CARDIAC CATHETERIZATION N/A 02/21/2015   Procedure: Coronary Stent Intervention;  Surgeon: Victory LELON Sharps, MD;  Location: [facility] INVASIVE CV LAB;  Service: Cardiovascular;  Laterality: N/A;   CARDIAC CATHETERIZATION N/A 02/21/2015   Procedure: Intravascular Pressure Wire/FFR Study;  Surgeon: Victory LELON Sharps, MD;  Location: [facility] INVASIVE CV LAB;  Service: Cardiovascular;  Laterality: N/A;   CARDIAC CATHETERIZATION N/A 06/20/2015   Procedure: Left Heart Cath and Cors/Grafts Angiography;  Surgeon: Ezra GORMAN Shuck, MD;  Location: [facility] INVASIVE CV LAB;  Service: Cardiovascular;  Laterality: N/A;   CARDIAC CATHETERIZATION N/A 11/19/2015   Procedure: Left Heart Cath and Cors/Grafts Angiography;  Surgeon: Peter M Jordan, MD;  Location: [facility] INVASIVE CV LAB;  Service: Cardiovascular;  Laterality: N/A;   CARDIAC CATHETERIZATION N/A 11/19/2015   Procedure: Coronary Stent Intervention;  Surgeon: Peter M Jordan, MD;  Location: [facility] INVASIVE CV LAB;  Service: Cardiovascular;  Laterality: N/A;   CATARACT EXTRACTION W/ INTRAOCULAR LENS  IMPLANT, BILATERAL Bilateral 1980s-1990s   CORONARY ANGIOPLASTY WITH STENT PLACEMENT  02/21/2015   1 stent   CORONARY ARTERY BYPASS GRAFT  05/2004   CABG X4   EYE SURGERY  LAPAROSCOPIC CHOLECYSTECTOMY  1990s   MASTECTOMY Left ~ 2012   PERIPHERAL VASCULAR CATHETERIZATION N/A 12/12/2014   Procedure: Abdominal Aortogram;  Surgeon: Deatrice DELENA Cage, MD;  Location: [facility] INVASIVE CV LAB;  Service: Cardiovascular;  Laterality: N/A;   RETINAL DETACHMENT SURGERY Right 1990s   Patient Active Problem List   Diagnosis Date Noted   Trauma 05/01/2022   Rib fractures 04/24/2022   Pain due to onychomycosis of toenails of both feet 04/09/2021   Diabetic neuropathy (HCC) 04/09/2021   Type 2 diabetes mellitus with vascular disease (HCC) 04/09/2021   Obesity (BMI 30-39.9) 01/16/2018   Mixed  hyperlipidemia 09/21/2016   Carotid artery disease 12/03/2015   OSA (obstructive sleep apnea) 12/03/2015   Pain and swelling of left lower leg 11/17/2015   Coronary artery disease    Hypertensive heart disease    Type II diabetes mellitus (HCC)    PAD (peripheral artery disease) 11/03/2014   Chest pain 08/06/2014   Nausea with vomiting 05/08/2014   Allergic reaction 05/08/2014   Hypercholesteremia    Hypertension    Diabetes mellitus (HCC)    Hx of tobacco use, presenting hazards to health 12/21/2013   Varicose veins of left lower extremity 12/18/2013   Cardiac enzymes elevated 03/01/2012   Chronic diastolic heart failure, NYHA class 1 (HCC) 03/01/2012   Bilateral pulmonary embolism (HCC) 02/29/2012   History of breast cancer, DCIS, Left mastectomy 03/21/2009. 03/06/2011   Chest pain 11/30/2010   Left carotid bruit 02/26/2009   CHRONIC LEFT LEG VENOUS INSUFFICIENCY 07/02/2008   EDEMA 05/14/2008   HYPERCHOLESTEROLEMIA 05/10/2008   CAD, ARTERY BYPASS GRAFT 05/10/2008   ESOPHAGITIS, REFLUX 05/10/2008   DM (diabetes mellitus) type I uncontrolled, periph vascular disorder (HCC) 10/18/2006   DEPRESSION 10/18/2006   Essential hypertension 10/18/2006    PCP: Carlin Dale Gull, MD  REFERRING PROVIDER: Darra Macintosh, NP  REFERRING DIAG: C43.61 (ICD-10-CM) - Malignant melanoma of right upper limb, including shoulder I89.0 (ICD-10-CM) - Lymphedema, not elsewhere classified  THERAPY DIAG:  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 I was able to mostly get the sleeve on my  Lt arm, but I couldn't get it on my Rt.   EVAL: Every since the surgery especially the last one - my clothes stopped fitting me. My shirts no longer fit this arm. They were very tight. I did not know what was going on. I had not gained any weight until very recently.   PERTINENT HISTORY: History of melanoma as follows: - 12/26/19 presented with a lesion on R posterior upper arm to dermatology s/p shave biopsy with path showing lentiginous melanoma at least 2.49mm deep, ulcerated, 02/06/20 s/p WLE and SLB with residual melanoma in situ at the excision, margins free, 0/2 R axillary LNs negative for melanoma. Staged as pT3bN0. - 12/10/20 shave biopsy of R forearm showed superficial spreading melanoma, at least 1.23mm thick, margins extended to base. - 01/28/21 excision of a R radial distal forearm melanoma with no residual melanoma. Non-migratory SLN. Staged as pT2aNX. BRAF negative again. - 08/19/21 presented for routine follow-up with a 2cm R axillary LN was palpated.  - 08/28/21 R axillary core needle biopsy with path revealing metastatic melanoma at least 0.2cm, non-brisk TILs, positive margins, focal necrosis present. LAG3 highlights about 10% of cells. - 09/01/21 PET-CT showed 3cm R axillary LN with subcentimeter RUL lung nodule - 09/12/21 MRI brain negative for metastatic disease- 09/18/21-02/11/22 neoadjuvant pembrolizumab  - 11/11/21 PET-CT after 4 cycles showed response in R axilla but still persistent disease, new rectal thickening and uptake- 02/11/22 PET-CT showed a near complete response to NAJ therapy with resolution of disease in R axilla, still a bit of rectal thickening compared to prior - 03/05/22 limited R axillary LN dissection with path revealing 1.4cm deposit of metastatic melanoma presenting in 1/3 LNs, showing 100% necrosis with no  viable tumor in extracapsular tissue, margins uninvolved. Also has history of coronary artery disease status post bypass grafting in 2006, PCI in 2015 and 2018 peripheral vascular disease, pulmonary embolus, history of breast cancer, hypertension, hyperlipidemia, carotid artery disease, and diabetes mellitus.  She been followed by Dr. Alveta since 2019. She underwent bypass surgery in 2006 and stent placements in 2015 and 2018. History of L breast cancer and underwent L mastectomy - pt thinks this was around 2011 with 1 node removed and it was negative. Has blood clot behind knee but unable to stent due to location. Going to have doppler repeated. Has rotator cuff tear in R shoulder but pt does not want surgery  PAIN:  Are you having pain? No NPRS scale: 0/10 Pain location: lower back and L leg Pain orientation: Left  PAIN TYPE: aching Pain description: constant  Aggravating factors: getting up from chair Relieving factors: moving around  PRECAUTIONS: Other: at risk for lymphedema bilaterally  RED FLAGS: PVD, s/p bypass in 2006, hx of blood clots, current blood clot behind knee    WEIGHT BEARING RESTRICTIONS: No  FALLS:  Has patient fallen in last 6 months? Yes. 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  COGNITION: Overall cognitive status: Within  functional limits for tasks assessed   PALPATION: Thickness palpable in medial upper arm bilaterally with R worse than L  OBSERVATIONS / OTHER ASSESSMENTS: L forearm appears swollen and R upper arm appears swollen, difficulty getting up from chair and difficulty crossing legs due to tightness, redness at medial R upper arm  POSTURE: rounded shoulders, forward head  UPPER EXTREMITY AROM/PROM:  A/PROM RIGHT   eval   Shoulder extension   Shoulder flexion 118  Shoulder abduction 87  Shoulder internal rotation   Shoulder external rotation     (Blank rows = not tested)  A/PROM LEFT   eval  Shoulder extension   Shoulder flexion 140  Shoulder abduction 129  Shoulder internal rotation   Shoulder external rotation     (Blank rows = not tested)  LYMPHEDEMA ASSESSMENTS:  SURGERY TYPE/DATE: 2011 (per pt) L mastectomy and SLNB, also numerous surgeries for melanoma on R UE. 12/26/19 s/p shave biopsy R posterior upper arm, 02/06/20 s/p WLE and SLB with residual melanoma in situ at the excision, margins free, 0/2 R axillary LNs negative for melanoma. 12/10/20 shave biopsy of R forearm showed superficial spreading melanoma- 01/28/21 excision of a R radial distal forearm melanoma with no residual melanoma. Non-migratory SLN.- 08/28/21 R axillary core needle biopsy with path revealing metastatic melanoma at least 0.2cm, non-brisk TILs, positive margins, focal necrosis present. 03/05/22 limited R axillary LN dissection with path revealing 1.4cm deposit of metastatic melanoma presenting in 1/3 LNs, showing 100% necrosis with no viable tumor in extracapsular tissue, margins uninvolved.   NUMBER OF LYMPH NODES REMOVED: 0/1 on L, at least 5 nodes removed from R  CHEMOTHERAPY: completed for melanoma   LYMPHEDEMA ASSESSMENTS:   LANDMARK RIGHT  eval RIGHT  11/01/23 Right 11/17/23  At axilla  35.5 35.5 34.9  15 cm proximal to the proximal aspect of the olecranon process 36.2 35.2 33.4  10 cm proximal to  the proximal aspect of the olecranon process 36.5 35 33.7  Olecranon process 30.5 30.8 29  15  cm proximal to the proximal aspect of the ulnar styloid process 27.4 27.7 27.6  10 cm proximal to the proximal aspect of the ulnar styloid process 23 24 24.4  Just distal to the ulnar styloid process 17.2 17.2 16.1  Across hand at thumb web space 20.5 19.4 18.4  At base of 2nd digit 7 6.9 6.3  (Blank rows = not tested)  LANDMARK LEFT  eval  At axilla  37.5  15 cm proximal to the proximal aspect of the olecranon process 39  10 cm proximal to the proximal aspect of the olecranon process 38.2  Olecranon process 29.5  15 cm proximal to the proximal aspect of the ulnar styloid process 28.1  10 cm proximal to  the proximal aspect of the ulnar styloid process 23.6  Just distal to the ulnar styloid process 17.1  Across hand at thumb web space 20.1  At base of 2nd digit 7  (Blank rows = not tested)  Rt volume:   eval:  Lt :   Eval:   Chest circumference just inferior to the axillae:  Chest circumference at the largest point:     LLIS:  Flowsheet Row Outpatient Rehab from 10/26/2023 in [facility]  Lymphedema Life Impact Scale Total Score 51.47 %    TODAY'S TREATMENT 11/24/23: Manual Therapy In supine: Short neck, superficial abdominals  and 5 diaphragmatic breaths, Rt inguinal nodes and establishment of Rt axillo-inguinal pathway, then Rt UE working proximal to distal, moving inner upper arm outwards and upwards, and doing both sides of forearms, then retracing all steps P/ROM to Rt shoulder into flex, scaption and D2 with scapular depression throughout by therapist, pt very limited with scapular depression and mindful of pts pain during due to RTC tear Therapeutic Exercises Pulleys into flex and abd x 2 mins each with VC's during to decrease bil scapula compensation Rolled large green ball on mat table for flex x 10 and then bil UE abd x 5  each Standing with back against wall/core engaged and head/shoulders against wall for bil UE 3 way raises with 1# into flex, scaption and abd x 10 each returning therapist demo and tactile cues during for correct UE position. Pt with a catch in her bicep tendon when returning her Rt UE to her side so limited starting position to ~15 degrees flex.  11/17/23: Orthotic Initial Donned pts Rt UE compression sleeve and spent time educating her and sister how to roll sleeve down about half way before pulling on. Then doffed sleeve and had pt return demo. Educated her on importance or sleeve being spread out evenly on arm throughout day instead of just pulling it up from the top as it moves on her arm during the day and how rubber type gloves (gardening or kitchen) can assist her with this without having to pull it off and start over. Pt was able to return correct demo and was also encouraged to hold her doorframe for stability of the UE that she's pulling the sleeve onto.  Therapeutic Exercise Pulleys into flex x 4 mins and abd x 3 mins with demo, tactile and VC's throughout to work on decreasing Rt scapular compensation which pt struggles with due to protective posture Tried rolling yellow ball up wall into flex x 5 reps but pt struggled with technique so switched to rolling larger green ball on mat table into flexion x 10, pt with slow return of demo and requires increased VC's/encouragement that she is performing exercises correctly Manual Therapy P/ROM to Rt shoulder into flex, scaption and D2 with scapular depression throughout by therapist, pt very limited with scapular depression and mindful of pts pain during due to RTC tear  11/01/23 Re-measured circumference for changes in lymphedema measurements following compression bandaging Patient education about getting a compression sleeve vs bandaging due to no significant improvements after multiple sessions of compression bandaging In supine: Short neck,  superficial and deep abdominals (careful near Rt ribcage where pt had recent fracture) Rt axillary nodes and R inguinal nodes and establishment of axilloinguinal pathway, then R UE working proximal to distal, moving inner upper arm outwards and upwards, and doing both sides of forearms, spending extra time in any areas of fibrosis then retracing all steps Will order M4 sleeves and pt was given prescription to sign in person.   10/13/23 Reviewed importance of not sticking anything in the bandage to itch especially with her fragile skin.  She had a bandaid on the Rt base of the thumb and some possible new spots of ecchymosis but she has a lot of these already so it was hard to tell which were new.   In supine: Short neck, superficial and deep abdominals (careful near Rt ribcage where pt had recent fracture) Rt axillary nodes and R inguinal nodes and establishment of axilloinguinal pathway, then R UE working proximal to distal, moving inner  upper arm outwards and upwards, and doing both sides of forearms, spending extra time in any areas of fibrosis then retracing all steps Then seated application of bandage: eucerin lotion, tg soft size medium, artiflex from hand to axilla, 6cm hand with lighter application due to minimal swelling, 8cm starting at wrist with X at elbow, then 10cm wrist to axilla      HOME EXERCISE PROGRAM: Can try piriformis stretch by crossing ankle at opposite knee  ASSESSMENT:  CLINICAL IMPRESSION: Pt and sister are doing well with getting compression sleeve on and pt reports though it feels a bit tight, overall it's comfortable and can wear it all day. Continued with MLD to Rt UE and then continued with progression of bil shoulder AA/A/ROM exs working to improve shoulder ROM being mindful of avoiding pain at Rt shoulder end motions due to RTC tear. Also able to add gentle bil UE strength which she did fairly well with, will benefit from further review for reinforcement of correct  technique.   OBJECTIVE IMPAIRMENTS: decreased knowledge of condition, decreased knowledge of use of DME, decreased mobility, difficulty walking, decreased ROM, increased edema, increased fascial restrictions, impaired UE functional use, postural dysfunction, and pain.   ACTIVITY LIMITATIONS: carrying, lifting, bending, sitting, standing, squatting, sleeping, stairs, transfers, bed mobility, and reach over head  PARTICIPATION LIMITATIONS: meal prep, cleaning, laundry, shopping, community activity, and yard work  PERSONAL FACTORS: Age, Fitness, Past/current experiences, Time since onset of injury/illness/exacerbation, and 3+ comorbidities: hx of coronary artery disease, pulmonary embolus, hx of breast cancer, carotid artery disease and diabetes, stent placement  are also affecting patient's functional outcome.   REHAB POTENTIAL: Good  CLINICAL DECISION MAKING: Evolving/moderate complexity  EVALUATION COMPLEXITY: Moderate  GOALS: Goals reviewed with patient? Yes  SHORT TERM GOALS: Target date: 11/16/23  Pt will demonstrate a 1 cm reduction of lymphedema 15 cm proximal to R olecranon to decrease risk of cellulitis.  Baseline: Goal status: DEFERRED  2.  Pt will obtain appropriate compression sleeve for long term management of RUE lymphedema. Baseline:  Goal status: INITIAL  3.  Pt will demonstrate 95 degrees of R shoulder abduction to allow her to reach out to the side. Baseline:  Goal status: INITIAL  4.  Pt and or family member will be able to independently don compression bandaging for management of lymphedema.  Baseline:  Goal status: DEFERRED   LONG TERM GOALS: Target date: 12/07/23  Pt will obtain compression garments for long term management of lymphedema. Baseline:  Goal status: INITIAL  2.  Pt will be independent in self MLD for long term management of lymphedema. Baseline:  Goal status: INITIAL  3.  Pt will report a 75% improvement in L LE pain once cleared by dr  to do PT for this to allow improved function. Baseline:  Goal status: INITIAL  4.  Pt will repot 50% improvement in feelings of heaviness in bilateral UEs to allow improved comfort.  Baseline:  Goal status: INITIAL  5.  Pt will be independent in a home exercise program for continued stretching and strengthening.  Baseline:  Goal status: INITIAL   PLAN:  PT FREQUENCY: 3x/week  PT DURATION: 6 weeks  PLANNED INTERVENTIONS: [id number]- PT Re-evaluation, 97110-Therapeutic exercises, 97530- Therapeutic activity, 97112- Neuromuscular re-education, 97535- Self Care, [id number]- Manual therapy, [phone]- Orthotic Initial, [phone]- Orthotic/Prosthetic subsequent, Patient/Family education, Balance training, Joint mobilization, Therapeutic exercises, Therapeutic activity, Neuromuscular re-education, Gait training, and Self Care  PLAN FOR NEXT SESSION: Continue MLD - teach patient and sister -  can work on L LE once cleared by doctor, PROM for bilateral shoulders - can also do pulleys or ball Entered new order on [facility] [employer] 11/05/23 - 11/17/23 pt now has compression sleeve  Berwyn Knights, PTA 11/24/23 12:04 PM

## 2023-11-25 DIAGNOSIS — Z23 Encounter for immunization: Secondary | ICD-10-CM | POA: Diagnosis not present

## 2023-11-25 DIAGNOSIS — Z6829 Body mass index (BMI) 29.0-29.9, adult: Secondary | ICD-10-CM | POA: Diagnosis not present

## 2023-11-25 DIAGNOSIS — E113292 Type 2 diabetes mellitus with mild nonproliferative diabetic retinopathy without macular edema, left eye: Secondary | ICD-10-CM | POA: Diagnosis not present

## 2023-11-25 DIAGNOSIS — F419 Anxiety disorder, unspecified: Secondary | ICD-10-CM | POA: Diagnosis not present

## 2023-11-26 ENCOUNTER — Ambulatory Visit

## 2023-11-29 ENCOUNTER — Ambulatory Visit

## 2023-11-30 ENCOUNTER — Ambulatory Visit: Admitting: Cardiovascular Disease

## 2023-12-01 ENCOUNTER — Ambulatory Visit

## 2023-12-01 DIAGNOSIS — M25611 Stiffness of right shoulder, not elsewhere classified: Secondary | ICD-10-CM | POA: Diagnosis not present

## 2023-12-01 DIAGNOSIS — I972 Postmastectomy lymphedema syndrome: Secondary | ICD-10-CM | POA: Diagnosis not present

## 2023-12-01 DIAGNOSIS — R293 Abnormal posture: Secondary | ICD-10-CM | POA: Diagnosis not present

## 2023-12-01 DIAGNOSIS — C4361 Malignant melanoma of right upper limb, including shoulder: Secondary | ICD-10-CM | POA: Diagnosis not present

## 2023-12-01 DIAGNOSIS — I890 Lymphedema, not elsewhere classified: Secondary | ICD-10-CM

## 2023-12-01 DIAGNOSIS — M25612 Stiffness of left shoulder, not elsewhere classified: Secondary | ICD-10-CM

## 2023-12-01 NOTE — Therapy (Signed)
 OUTPATIENT PHYSICAL THERAPY  UPPER EXTREMITY ONCOLOGY  Patient Name: Taylor [name] MRN: [id number] DOB:02/21/1945, 78 y.o., female Today's Date: 12/01/2023  END OF SESSION:  PT End of Session - 12/01/23 1117     Visit Number 7    Number of Visits 19    Date for Recertification  12/07/23    PT Start Time 1108    PT Stop Time 1202    PT Time Calculation (min) 54 min    Activity Tolerance Patient tolerated treatment well    Behavior During Therapy [facility] for tasks assessed/performed              Past Medical History:  Diagnosis Date   Cancer of left breast (HCC)    Complication of anesthesia    Coronary artery disease    a. 2006 s/p CABG x 4 (LIMA->LAD, VG->Diag->OM, VG->RPDA);  b. 02/2015: [place]  s/p DES to [facility], VG->PDA & VG->D1->OM 100; c. 06/2015 Cath: diffuse dzs->Med rx; d. 11/2015 Cath/PCI: LM nl, LAD 100ost, 95d, D1 90ost, LCX 35ost, 65p, OM2 40, lat OM2 90, RCA 95p (2.75x16 Synergy DES), patent mid stent, RPLB 70, LIMA->LAD ok.EF55-65%.   Depression    Hx of tobacco use, presenting hazards to health    a. quit 2006.   Hypercholesteremia    Hypertension    Hypertensive heart disease    Echo 04/2019: Inferior HK, EF 55, normal RV SF, RVSP normal at 29.2, mild BAE, trivial MR, mild aortic valve sclerosis without stenosis   Left carotid bruit    a. 09/2014 Carotid U/S: 1-39% bilat ICA stenosis.   PAD (peripheral artery disease)    a. 11/2014 ABI: R: 0.63, L 0.59;  b. 11/2014 Periph Angio: bilat pop occlusions. L - short w/ reconstituion via collats in dist pop w/ 2 vessel runoff, R long w/ reconstitution in prox tib/peroneal arteries-->med Rx w/ pletal .   Pulmonary embolism (HCC) a. 2014.   Reflux esophagitis    Type II diabetes mellitus (HCC)    Past Surgical History:  Procedure Laterality Date   BREAST BIOPSY Left ~ 2012   BUNIONECTOMY Left 1970s   CARDIAC CATHETERIZATION  04/2004   CARDIAC CATHETERIZATION N/A 02/21/2015   Procedure: Left Heart Cath and Cors/Grafts  Angiography;  Surgeon: Victory LELON Sharps, MD;  Location: [facility] INVASIVE CV LAB;  Service: Cardiovascular;  Laterality: N/A;   CARDIAC CATHETERIZATION N/A 02/21/2015   Procedure: Coronary Stent Intervention;  Surgeon: Victory LELON Sharps, MD;  Location: [facility] INVASIVE CV LAB;  Service: Cardiovascular;  Laterality: N/A;   CARDIAC CATHETERIZATION N/A 02/21/2015   Procedure: Intravascular Pressure Wire/FFR Study;  Surgeon: Victory LELON Sharps, MD;  Location: [facility] INVASIVE CV LAB;  Service: Cardiovascular;  Laterality: N/A;   CARDIAC CATHETERIZATION N/A 06/20/2015   Procedure: Left Heart Cath and Cors/Grafts Angiography;  Surgeon: Ezra GORMAN Shuck, MD;  Location: [facility] INVASIVE CV LAB;  Service: Cardiovascular;  Laterality: N/A;   CARDIAC CATHETERIZATION N/A 11/19/2015   Procedure: Left Heart Cath and Cors/Grafts Angiography;  Surgeon: Peter M Jordan, MD;  Location: [facility] INVASIVE CV LAB;  Service: Cardiovascular;  Laterality: N/A;   CARDIAC CATHETERIZATION N/A 11/19/2015   Procedure: Coronary Stent Intervention;  Surgeon: Peter M Jordan, MD;  Location: [facility] INVASIVE CV LAB;  Service: Cardiovascular;  Laterality: N/A;   CATARACT EXTRACTION W/ INTRAOCULAR LENS  IMPLANT, BILATERAL Bilateral 1980s-1990s   CORONARY ANGIOPLASTY WITH STENT PLACEMENT  02/21/2015   1 stent   CORONARY ARTERY BYPASS GRAFT  05/2004   CABG X4   EYE SURGERY  LAPAROSCOPIC CHOLECYSTECTOMY  1990s   MASTECTOMY Left ~ 2012   PERIPHERAL VASCULAR CATHETERIZATION N/A 12/12/2014   Procedure: Abdominal Aortogram;  Surgeon: Deatrice DELENA Cage, MD;  Location: [facility] INVASIVE CV LAB;  Service: Cardiovascular;  Laterality: N/A;   RETINAL DETACHMENT SURGERY Right 1990s   Patient Active Problem List   Diagnosis Date Noted   Trauma 05/01/2022   Rib fractures 04/24/2022   Pain due to onychomycosis of toenails of both feet 04/09/2021   Diabetic neuropathy (HCC) 04/09/2021   Type 2 diabetes mellitus with vascular disease (HCC) 04/09/2021   Obesity (BMI 30-39.9) 01/16/2018   Mixed  hyperlipidemia 09/21/2016   Carotid artery disease 12/03/2015   OSA (obstructive sleep apnea) 12/03/2015   Pain and swelling of left lower leg 11/17/2015   Coronary artery disease    Hypertensive heart disease    Type II diabetes mellitus (HCC)    PAD (peripheral artery disease) 11/03/2014   Chest pain 08/06/2014   Nausea with vomiting 05/08/2014   Allergic reaction 05/08/2014   Hypercholesteremia    Hypertension    Diabetes mellitus (HCC)    Hx of tobacco use, presenting hazards to health 12/21/2013   Varicose veins of left lower extremity 12/18/2013   Cardiac enzymes elevated 03/01/2012   Chronic diastolic heart failure, NYHA class 1 (HCC) 03/01/2012   Bilateral pulmonary embolism (HCC) 02/29/2012   History of breast cancer, DCIS, Left mastectomy 03/21/2009. 03/06/2011   Chest pain 11/30/2010   Left carotid bruit 02/26/2009   CHRONIC LEFT LEG VENOUS INSUFFICIENCY 07/02/2008   EDEMA 05/14/2008   HYPERCHOLESTEROLEMIA 05/10/2008   CAD, ARTERY BYPASS GRAFT 05/10/2008   ESOPHAGITIS, REFLUX 05/10/2008   DM (diabetes mellitus) type I uncontrolled, periph vascular disorder (HCC) 10/18/2006   DEPRESSION 10/18/2006   Essential hypertension 10/18/2006    PCP: Carlin Dale Gull, MD  REFERRING PROVIDER: Darra Macintosh, NP  REFERRING DIAG: C43.61 (ICD-10-CM) - Malignant melanoma of right upper limb, including shoulder I89.0 (ICD-10-CM) - Lymphedema, not elsewhere classified  THERAPY DIAG:  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 I haven't seen  a doctor yet about my Lt leg pain. I need to make an appt with an orthopedist for that.   EVAL: Every since the surgery especially the last one - my clothes stopped fitting me. My shirts no longer fit this arm. They were very tight. I did not know what was going on. I had not gained any weight until very recently.   PERTINENT HISTORY: History of melanoma as follows: - 12/26/19 presented with a lesion on R posterior upper arm to dermatology s/p shave biopsy with path showing lentiginous melanoma at least 2.24mm deep, ulcerated, 02/06/20 s/p WLE and SLB with residual melanoma in situ at the excision, margins free, 0/2 R axillary LNs negative for melanoma. Staged as pT3bN0. - 12/10/20 shave biopsy of R forearm showed superficial spreading melanoma, at least 1.18mm thick, margins extended to base. - 01/28/21 excision of a R radial distal forearm melanoma with no residual melanoma. Non-migratory SLN. Staged as pT2aNX. BRAF negative again. - 08/19/21 presented for routine follow-up with a 2cm R axillary LN was palpated.  - 08/28/21 R axillary core needle biopsy with path revealing metastatic melanoma at least 0.2cm, non-brisk TILs, positive margins, focal necrosis present. LAG3 highlights about 10% of cells. - 09/01/21 PET-CT showed 3cm R axillary LN with subcentimeter RUL lung nodule - 09/12/21 MRI brain negative for metastatic disease- 09/18/21-02/11/22 neoadjuvant pembrolizumab  - 11/11/21 PET-CT after 4 cycles showed response in R axilla but still persistent disease, new rectal thickening and uptake- 02/11/22 PET-CT showed a near complete response to NAJ therapy with resolution of disease in R axilla, still a bit of rectal thickening compared to prior - 03/05/22 limited R axillary LN dissection with path revealing 1.4cm deposit of metastatic melanoma presenting in 1/3  LNs, showing 100% necrosis with no viable tumor in extracapsular tissue, margins uninvolved. Also has history of coronary artery disease status post bypass grafting in 2006, PCI in 2015 and 2018 peripheral vascular disease, pulmonary embolus, history of breast cancer, hypertension, hyperlipidemia, carotid artery disease, and diabetes mellitus.  She been followed by Dr. Alveta since 2019. She underwent bypass surgery in 2006 and stent placements in 2015 and 2018. History of L breast cancer and underwent L mastectomy - pt thinks this was around 2011 with 1 node removed and it was negative. Has blood clot behind knee but unable to stent due to location. Going to have doppler repeated. Has rotator cuff tear in R shoulder but pt does not want surgery  PAIN:  Are you having pain? No NPRS scale: 0/10 Pain location: lower back and L leg Pain orientation: Left  PAIN TYPE: aching Pain description: constant  Aggravating factors: getting up from chair Relieving factors: moving around  PRECAUTIONS: Other: at risk for lymphedema bilaterally  RED FLAGS: PVD, s/p bypass in 2006, hx of blood clots, current blood clot behind knee    WEIGHT BEARING RESTRICTIONS: No  FALLS:  Has patient fallen in last 6 months? Yes. 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  COGNITION: Overall cognitive status: Within functional limits for tasks assessed   PALPATION: Thickness palpable in medial upper arm bilaterally with R worse than L  OBSERVATIONS / OTHER ASSESSMENTS: L forearm appears swollen and R upper arm appears swollen, difficulty getting up from chair and difficulty crossing legs due to tightness, redness at medial R upper arm  POSTURE: rounded shoulders, forward head  UPPER EXTREMITY AROM/PROM:  A/PROM RIGHT   eval  Right 12/01/23  Shoulder extension    Shoulder flexion 118 131  Shoulder abduction 87 105  Shoulder internal rotation    Shoulder external rotation      (Blank rows = not tested)  A/PROM LEFT   eval  Shoulder extension   Shoulder flexion 140  Shoulder abduction 129  Shoulder internal rotation   Shoulder external rotation     (Blank rows = not tested)  LYMPHEDEMA ASSESSMENTS:  SURGERY TYPE/DATE: 2011 (per pt) L mastectomy and SLNB, also numerous surgeries for melanoma on R UE. 12/26/19 s/p shave biopsy R posterior upper arm, 02/06/20 s/p WLE and SLB with residual melanoma in situ at the excision, margins free, 0/2 R axillary LNs negative for melanoma. 12/10/20 shave biopsy of R forearm showed superficial spreading melanoma- 01/28/21 excision of a R radial distal forearm melanoma with no residual melanoma. Non-migratory SLN.- 08/28/21 R axillary core needle biopsy with path revealing metastatic melanoma at least 0.2cm, non-brisk TILs, positive margins, focal necrosis present. 03/05/22 limited R axillary LN dissection with path revealing 1.4cm deposit of metastatic melanoma presenting in 1/3 LNs, showing 100% necrosis with no viable tumor in extracapsular tissue, margins uninvolved.   NUMBER OF LYMPH NODES REMOVED: 0/1 on L, at least 5 nodes removed from R  CHEMOTHERAPY: completed for melanoma   LYMPHEDEMA ASSESSMENTS:   LANDMARK RIGHT  eval RIGHT  11/01/23 Right 11/17/23  At axilla  35.5 35.5 34.9  15 cm proximal to  the proximal aspect of the olecranon process 36.2 35.2 33.4  10 cm proximal to the proximal aspect of the olecranon process 36.5 35 33.7  Olecranon process 30.5 30.8 29  15  cm proximal to the proximal aspect of the ulnar styloid process 27.4 27.7 27.6  10 cm proximal to the proximal aspect of the ulnar styloid process 23 24 24.4  Just distal to the ulnar styloid process 17.2 17.2 16.1  Across hand at thumb web space 20.5 19.4 18.4  At base of 2nd digit 7 6.9 6.3  (Blank rows = not tested)  LANDMARK LEFT  eval  At axilla  37.5  15 cm proximal to the proximal aspect of the olecranon process 39  10 cm proximal to the proximal aspect of the olecranon process 38.2  Olecranon process 29.5  15 cm proximal to the proximal aspect of the ulnar styloid process 28.1  10 cm proximal to  the proximal aspect of the ulnar styloid process 23.6  Just distal to the ulnar styloid process 17.1  Across hand at thumb web space 20.1  At base of 2nd digit 7  (Blank rows = not tested)  Rt volume:   eval:  Lt :   Eval:   Chest circumference just inferior to the axillae:  Chest circumference at the largest point:     LLIS:  Flowsheet Row Outpatient Rehab from 10/26/2023 in [facility]  Lymphedema Life Impact Scale Total  Score 51.47 %    TODAY'S TREATMENT 12/01/23: Therapeutic Exercises Pulleys into flex and abd x 2 mins each with VC's during to decrease bil scapula compensation, she was able to return correct demo with cuing and reports her sister got her these to use at home so reminded her to perform these slowly as well and not to push pain.  Rolled large green ball on mat table for flex x 10 Measured Rt shoulder A/ROM for goal assess and discussed current functional status for goal assess  Therapeutic Activities Supine Scapular series with yellow theraband except D2 due to RTC, x 7 each returning therapist demo. Pt had no pain with these so added to HEP.  Reinforced cuing to not push into pain with these and for slow motions to determine her limitations.  Manual Therapy In supine: Short neck, 5 diaphragmatic breaths, Rt inguinal nodes and establishment of Rt axillo-inguinal pathway, then Rt UE working proximal to distal, moving inner upper arm outwards and upwards, and doing both sides of forearms, then retracing all steps  11/24/23: Manual Therapy In supine: Short neck, superficial abdominals and 5 diaphragmatic breaths, Rt inguinal nodes and establishment of Rt axillo-inguinal pathway, then Rt UE working proximal to distal, moving inner upper arm outwards and upwards, and doing both sides of forearms, then retracing all steps P/ROM to Rt shoulder into flex, scaption and D2 with scapular depression throughout by therapist, pt very limited with scapular depression and mindful of pts pain during due to RTC tear Therapeutic Exercises Pulleys into flex and abd x 2 mins each with VC's during to decrease bil scapula compensation Rolled large green ball on mat table for flex x 10 and then bil UE abd x 5 each Standing with back against wall/core engaged and head/shoulders against wall for bil UE 3 way raises with 1# into flex, scaption and abd x 10 each returning therapist demo and tactile cues during for correct UE position. Pt with a catch in her bicep tendon when returning her Rt UE to her side so limited starting position to ~15 degrees flex.  11/17/23: Orthotic Initial Donned pts Rt UE compression sleeve and spent time educating her and sister how to roll sleeve down about half way before pulling on. Then doffed sleeve and had pt return demo. Educated her on importance or sleeve being spread out evenly on arm throughout day instead of just pulling it up from the top as it moves on her arm during the day and how rubber type gloves (gardening or kitchen) can assist her with this without having to pull it off and start over. Pt was able to return correct  demo and was also encouraged to hold her doorframe for stability of the UE that she's pulling the sleeve onto.  Therapeutic Exercise Pulleys into flex x 4 mins and abd x 3 mins with demo, tactile and VC's throughout to work on decreasing Rt scapular compensation which pt struggles with due to protective posture Tried rolling yellow ball up wall into flex x 5 reps but pt struggled with technique so switched to rolling larger green ball on mat table into flexion x 10, pt with slow return of demo and requires increased VC's/encouragement that she is performing exercises correctly Manual Therapy P/ROM to Rt shoulder into flex, scaption and D2 with scapular depression throughout by therapist, pt very limited with scapular depression and mindful of pts pain during due to RTC tear  11/01/23 Re-measured circumference for changes in lymphedema measurements following compression bandaging  Patient education about getting a compression sleeve vs bandaging due to no significant improvements after multiple sessions of compression bandaging In supine: Short neck, superficial and deep abdominals (careful near Rt ribcage where pt had recent fracture) Rt axillary nodes and R inguinal nodes and establishment of axilloinguinal pathway, then R UE working proximal to distal, moving inner upper arm outwards and upwards, and doing both sides of forearms, spending extra time in any areas of fibrosis then retracing all steps Will order M4 sleeves and pt was given prescription to sign in person.        HOME EXERCISE PROGRAM: Can try piriformis stretch by crossing ankle at opposite knee 12/01/23 - Supine Scapular series (except D2) with yellow theraband  ASSESSMENT:  CLINICAL IMPRESSION: Pt is wearing her compression sleeves daily and reports her sister got her pulleys for home use for AA/ROM. Spent time today educating her on importance of slow movements to learn her limitations and to not push into pain as we know  she has a RTC tear and she could be making it worse if she pushes into pain. Pt able to verbalize good understanding and sister was instructed in same as she helps her sister at times. Pt has progressed well and met some of her goals. Other goals were deferred due deciding not to bandage and pt hasn't seen an orthopedist for her leg pain yet.   OBJECTIVE IMPAIRMENTS: decreased knowledge of condition, decreased knowledge of use of DME, decreased mobility, difficulty walking, decreased ROM, increased edema, increased fascial restrictions, impaired UE functional use, postural dysfunction, and pain.   ACTIVITY LIMITATIONS: carrying, lifting, bending, sitting, standing, squatting, sleeping, stairs, transfers, bed mobility, and reach over head  PARTICIPATION LIMITATIONS: meal prep, cleaning, laundry, shopping, community activity, and yard work  PERSONAL FACTORS: Age, Fitness, Past/current experiences, Time since onset of injury/illness/exacerbation, and 3+ comorbidities: hx of coronary artery disease, pulmonary embolus, hx of breast cancer, carotid artery disease and diabetes, stent placement  are also affecting patient's functional outcome.   REHAB POTENTIAL: Good  CLINICAL DECISION MAKING: Evolving/moderate complexity  EVALUATION COMPLEXITY: Moderate  GOALS: Goals reviewed with patient? Yes  SHORT TERM GOALS: Target date: 11/16/23  Pt will demonstrate a 1 cm reduction of lymphedema 15 cm proximal to R olecranon to decrease risk of cellulitis.  Baseline: Goal status: DEFERRED  2.  Pt will obtain appropriate compression sleeve for long term management of RUE lymphedema. Baseline:  Goal status: MET  3.  Pt will demonstrate 95 degrees of R shoulder abduction to allow her to reach out to the side. Baseline: 12/01/23 - 105 degrees  Goal status: MET  4.  Pt and or family member will be able to independently don compression bandaging for management of lymphedema.  Baseline:  Goal status:  DEFERRED   LONG TERM GOALS: Target date: 12/07/23  Pt will obtain compression garments for long term management of lymphedema. Baseline:  Goal status: MET  2.  Pt will be independent in self MLD for long term management of lymphedema. Baseline: 12/01/23 - pt reports she thinks this will be hard to do correctly  Goal status: DEFERRED  3.  Pt will report a 75% improvement in L LE pain once cleared by dr to do PT for this to allow improved function. Baseline:  Goal status: DEFERRED - pt has yet to see her doctor about this and plans to address this later  4.  Pt will repot 50% improvement in feelings of heaviness in bilateral UEs to allow improved  comfort.  Baseline: 12/01/23 - pt reports arms feeling much better, especially when she is wearing her compression sleeves, but has trouble rating Goal status: PARTIALLY MET   5.  Pt will be independent in a home exercise program for continued stretching and strengthening.  Baseline: 12/01/23 - pt instructed in supine scapular series for postural strength  Goal status: MET   PLAN:  PT FREQUENCY: 3x/week  PT DURATION: 6 weeks  PLANNED INTERVENTIONS: [id number]- PT Re-evaluation, 97110-Therapeutic exercises, 97530- Therapeutic activity, 97112- Neuromuscular re-education, 97535- Self Care, [id number]- Manual therapy, [phone]- Orthotic Initial, [phone]- Orthotic/Prosthetic subsequent, Patient/Family education, Balance training, Joint mobilization, Therapeutic exercises, Therapeutic activity, Neuromuscular re-education, Gait training, and Self Care  PLAN FOR NEXT SESSION: D/C this visit per POC. Entered new order on [facility] 11/05/23 - 11/17/23 pt now has compression sleeve  Berwyn Knights, PTA 12/01/23 3:39 PM   DON'T PUSH INTO PAIN WITH ANY OF THESE EXERCISES. PERFORM SLOWLY SO YOUR BODY CAN TELL YOU WHEN TO STOP.  Over Head Pull: Narrow and Wide Grip   Cancer Rehab [phone]   On back, knees bent, feet flat, band across thighs, elbows  straight but relaxed. Pull hands apart (start). Keeping elbows straight, bring arms up and over head, hands toward floor. Keep pull steady on band. Hold momentarily. Return slowly, keeping pull steady, back to start. Then do same with a wider grip on the band (past shoulder width) Repeat _5-10__ times. Band color __yellow____   Side Pull: Double Arm   On back, knees bent, feet flat. Arms perpendicular to body, shoulder level, elbows straight but relaxed. Pull arms out to sides, elbows straight. Resistance band comes across collarbones, hands toward floor. Hold momentarily. Slowly return to starting position. Repeat _5-10__ times. Band color _yellow____   Shoulder Rotation: Double Arm   On back, knees bent, feet flat, elbows tucked at sides, bent 90, hands palms up. Pull hands apart and down toward floor, keeping elbows near sides. Hold momentarily. Slowly return to starting position. Repeat _5-10__ times. Band color __yellow____   PHYSICAL THERAPY DISCHARGE SUMMARY  Visits from Start of Care: 7  Current functional level related to goals / functional outcomes: Pt feels ready for DC - see above for goals    Remaining deficits: Lymphedema risk UE   Education / Equipment: Compression sleeves, final HEP  Plan: Patient agrees to discharge. Patient is being discharged due to meeting the stated rehab goals.     Saddie Raw, PT 12/02/23, 10:45 AM

## 2023-12-01 NOTE — Patient Instructions (Addendum)
 Taylor [name]

## 2023-12-03 ENCOUNTER — Ambulatory Visit

## 2023-12-06 ENCOUNTER — Ambulatory Visit: Admitting: Rehabilitation

## 2023-12-07 ENCOUNTER — Ambulatory Visit: Attending: Cardiovascular Disease | Admitting: Cardiovascular Disease

## 2023-12-07 VITALS — BP 122/58 | HR 46 | Ht 67.0 in | Wt 190.0 lb

## 2023-12-07 DIAGNOSIS — I25118 Atherosclerotic heart disease of native coronary artery with other forms of angina pectoris: Secondary | ICD-10-CM | POA: Insufficient documentation

## 2023-12-07 DIAGNOSIS — Z79899 Other long term (current) drug therapy: Secondary | ICD-10-CM | POA: Insufficient documentation

## 2023-12-07 DIAGNOSIS — Z01812 Encounter for preprocedural laboratory examination: Secondary | ICD-10-CM | POA: Insufficient documentation

## 2023-12-07 DIAGNOSIS — I739 Peripheral vascular disease, unspecified: Secondary | ICD-10-CM | POA: Insufficient documentation

## 2023-12-07 DIAGNOSIS — I10 Essential (primary) hypertension: Secondary | ICD-10-CM | POA: Diagnosis not present

## 2023-12-07 DIAGNOSIS — E785 Hyperlipidemia, unspecified: Secondary | ICD-10-CM | POA: Insufficient documentation

## 2023-12-07 NOTE — Patient Instructions (Signed)
 Medication Instructions:  Your physician recommends that you continue on your current medications as directed. Please refer to the Current Medication list given to you today.  *If you need a refill on your cardiac medications before your next appointment, please call your pharmacy*  Lab Work: Have lab work drawn today in the lab on the first floor--BMP and CBC If you have labs (blood work) drawn today and your tests are completely normal, you will receive your results only by: MyChart Message (if you have MyChart) OR A paper copy in the mail If you have any lab test that is abnormal or we need to change your treatment, we will call you to review the results.  Testing/Procedures: Your physician has requested that you have a peripheral vascular angiogram. This exam is performed at the hospital. During this exam IV contrast is used to look at arterial blood flow. Please review the information sheet given for details. Scheduled for 12/10  Follow-Up: At [facility], you and your health needs are our priority.  As part of our continuing mission to provide you with exceptional heart care, our providers are all part of one team.  This team includes your primary Cardiologist (physician) and Advanced Practice Providers or APPs (Physician Assistants and Nurse Practitioners) who all work together to provide you with the care you need, when you need it.  Your next appointment:   1 month(s)  Provider:   Dr Darron or Hao Meng, PA    We recommend signing up for the patient portal called MyChart.  Sign up information is provided on this After Visit Summary.  MyChart is used to connect with patients for Virtual Visits (Telemedicine).  Patients are able to view lab/test results, encounter notes, upcoming appointments, etc.  Non-urgent messages can be sent to your provider as well.   To learn more about what you can do with MyChart, go to [email or web address].   Other Instructions  [facility] A DEPT OF [place]. [facility] AT [address] A DEPT OF THE [place]. [facility] [address] [place] [address] Dept: [phone] Loc: [phone]  Taylor [name]  12/07/2023  You are scheduled for a Peripheral Angiogram on Wednesday, December 10 with Dr. Deatrice Darron.  1. Please arrive at the [facility] (Main Entrance A) at [facility]: [address] [place], [place] [address] at 10:30 AM (This time is 2 hour(s) before your procedure to ensure your preparation).   Free valet parking service is available. You will check in at ADMITTING. The support person will be asked to wait in the waiting room.  It is OK to have someone drop you off and come back when you are ready to be discharged.    Special note: Every effort is made to have your procedure done on time. Please understand that emergencies sometimes delay scheduled procedures.  2. Diet: Light meals may be eaten up to 6 hours before scheduled procedures from 12N and after; please stop eating at 6:30 AM   Light meal consist of plain toast, fruit, light soups, crackers.   3. Hydration: You need to be well hydrated before your procedure. On December 10, you may drink approved liquids (see below) until 2 hours before the procedure, with 16 oz of water as your last intake.   List of approved liquids water, clear juice, clear tea, black coffee, fruit juices, non-citric and without pulp, carbonated beverages, Gatorade, Kool -Aid, plain Jello-O and plain  ice popsicles.  4. Labs: done on 11/25  5. Medication instructions in preparation for your procedure: Do not take any diabetes medication the morning of the procedure Do not take furosemide  the morning of the procedure   Contrast Allergy: No    On the morning of your procedure, take your Aspirin  81 mg and Plavix /Clopidogrel  and any morning medicines NOT listed above.  You may use sips of water.  6. Plan to go home the same  day, you will only stay overnight if medically necessary. 7. Bring a current list of your medications and current insurance cards. 8. You MUST have a responsible person to drive you home. 9. Someone MUST be with you the first 24 hours after you arrive home or your discharge will be delayed. 10. Please wear clothes that are easy to get on and off and wear slip-on shoes.  Thank you for allowing us  to care for you!   -- [facility]

## 2023-12-07 NOTE — Progress Notes (Signed)
 Cardiology Office Note   Date:  12/07/2023   ID:  Taylor [name], DOB Aug 08, 1945, MRN [id number]  PCP:  Okey Carlin Redbird, MD  Cardiologist:  Dr. Michele  No chief complaint on file.     History of Present Illness: Taylor [name] is a 78 y.o. female who was referred for evaluation management of peripheral arterial disease. She was seen by me in the past for this but has not been seen since 2020. She has known history of CAD s/p CABG in 2006, pulmonary embolism in 2014, breast cancer status post left mastectomy in 2011, hyperlipidemia, type 2 diabetes, sleep apnea on CPAP and hypertension. She is not a smoker. She was seen in the past for bilateral calf claudication. Lower extremity angiography in November 2016 showed bilateral popliteal artery occlusion. She was treated medically without revascularization.  She presented with unstable angina in February 2017. She was found to have critical mid RCA stenosis which was treated by PCI and drug-eluting stent placement.   She had no significant improvement in symptoms with cilostazol .    She underwent lower extremity arterial Doppler studies in October which showed an ABI of 0.67 on the right and 0.48 on the left.  Great toe pressure was 40 mmHg on the left. Duplex showed monophasic waveforms in the common femoral arteries likely with bilateral popliteal artery occlusion.  She reports significant worsening of left calf claudication over the last few months which is now happening with minimal walking.  Symptoms on the right are stable.  She is also having issues with her lower back that she thinks is due to sciatica.  She had a small superficial ulceration between the 4th and 5th toes.  Past Medical History:  Diagnosis Date   Cancer of left breast (HCC)    Complication of anesthesia    Coronary artery disease    a. 2006 s/p CABG x 4 (LIMA->LAD, VG->Diag->OM, VG->RPDA);  b. 02/2015: [place]  s/p DES to [facility], VG->PDA & VG->D1->OM 100; c.  06/2015 Cath: diffuse dzs->Med rx; d. 11/2015 Cath/PCI: LM nl, LAD 100ost, 95d, D1 90ost, LCX 35ost, 65p, OM2 40, lat OM2 90, RCA 95p (2.75x16 Synergy DES), patent mid stent, RPLB 70, LIMA->LAD ok.EF55-65%.   Depression    Hx of tobacco use, presenting hazards to health    a. quit 2006.   Hypercholesteremia    Hypertension    Hypertensive heart disease    Echo 04/2019: Inferior HK, EF 55, normal RV SF, RVSP normal at 29.2, mild BAE, trivial MR, mild aortic valve sclerosis without stenosis   Left carotid bruit    a. 09/2014 Carotid U/S: 1-39% bilat ICA stenosis.   PAD (peripheral artery disease)    a. 11/2014 ABI: R: 0.63, L 0.59;  b. 11/2014 Periph Angio: bilat pop occlusions. L - short w/ reconstituion via collats in dist pop w/ 2 vessel runoff, R long w/ reconstitution in prox tib/peroneal arteries-->med Rx w/ pletal .   Pulmonary embolism (HCC) a. 2014.   Reflux esophagitis    Type II diabetes mellitus (HCC)     Past Surgical History:  Procedure Laterality Date   BREAST BIOPSY Left ~ 2012   BUNIONECTOMY Left 1970s   CARDIAC CATHETERIZATION  04/2004   CARDIAC CATHETERIZATION N/A 02/21/2015   Procedure: Left Heart Cath and Cors/Grafts Angiography;  Surgeon: Victory LELON Sharps, MD;  Location: [facility] INVASIVE CV LAB;  Service: Cardiovascular;  Laterality: N/A;   CARDIAC CATHETERIZATION N/A 02/21/2015   Procedure: Coronary Stent Intervention;  Surgeon: Victory  LELON Sharps, MD;  Location: [facility] INVASIVE CV LAB;  Service: Cardiovascular;  Laterality: N/A;   CARDIAC CATHETERIZATION N/A 02/21/2015   Procedure: Intravascular Pressure Wire/FFR Study;  Surgeon: Victory LELON Sharps, MD;  Location: [facility] INVASIVE CV LAB;  Service: Cardiovascular;  Laterality: N/A;   CARDIAC CATHETERIZATION N/A 06/20/2015   Procedure: Left Heart Cath and Cors/Grafts Angiography;  Surgeon: Ezra GORMAN Shuck, MD;  Location: [facility] INVASIVE CV LAB;  Service: Cardiovascular;  Laterality: N/A;   CARDIAC CATHETERIZATION N/A 11/19/2015   Procedure: Left Heart Cath  and Cors/Grafts Angiography;  Surgeon: Peter M Jordan, MD;  Location: [facility] INVASIVE CV LAB;  Service: Cardiovascular;  Laterality: N/A;   CARDIAC CATHETERIZATION N/A 11/19/2015   Procedure: Coronary Stent Intervention;  Surgeon: Peter M Jordan, MD;  Location: [facility] INVASIVE CV LAB;  Service: Cardiovascular;  Laterality: N/A;   CATARACT EXTRACTION W/ INTRAOCULAR LENS  IMPLANT, BILATERAL Bilateral 1980s-1990s   CORONARY ANGIOPLASTY WITH STENT PLACEMENT  02/21/2015   1 stent   CORONARY ARTERY BYPASS GRAFT  05/2004   CABG X4   EYE SURGERY     LAPAROSCOPIC CHOLECYSTECTOMY  1990s   MASTECTOMY Left ~ 2012   PERIPHERAL VASCULAR CATHETERIZATION N/A 12/12/2014   Procedure: Abdominal Aortogram;  Surgeon: Deatrice DELENA Cage, MD;  Location: [facility] INVASIVE CV LAB;  Service: Cardiovascular;  Laterality: N/A;   RETINAL DETACHMENT SURGERY Right 1990s     Current Outpatient Medications  Medication Sig Dispense Refill   acetaminophen  (TYLENOL ) 500 MG tablet Take 1,000 mg by mouth every 6 (six) hours as needed for headache (pain).      ALPRAZolam  (XANAX ) 0.25 MG tablet Take 0.25 mg by mouth daily as needed for anxiety.      aspirin  81 MG tablet Take 81 mg by mouth daily.     buPROPion (WELLBUTRIN XL) 150 MG 24 hr tablet Take 150 mg by mouth daily.     clopidogrel  (PLAVIX ) 75 MG tablet Take 1 tablet (75 mg total) by mouth daily. 90 tablet 2   furosemide  (LASIX ) 20 MG tablet Take 1 tablet (20 mg total) by mouth daily as needed. For leg swelling. 90 tablet 3   irbesartan (AVAPRO) 150 MG tablet Take 150 mg by mouth daily.     isosorbide  mononitrate (IMDUR ) 60 MG 24 hr tablet Take 1 tablet by mouth once daily 90 tablet 2   levothyroxine  (SYNTHROID ) 75 MCG tablet Take 75 mcg by mouth daily before breakfast.     nitroGLYCERIN  (NITROSTAT ) 0.4 MG SL tablet Place 1 tablet (0.4 mg total) under the tongue every 5 (five) minutes as needed for chest pain. 25 tablet 3   pioglitazone (ACTOS) 45 MG tablet Take 45 mg by mouth daily.      repaglinide (PRANDIN) 2 MG tablet Take 2 mg by mouth 2 (two) times daily before a meal.     rosuvastatin  (CRESTOR ) 40 MG tablet Take 40 mg by mouth daily.     timolol  (BETIMOL ) 0.25 % ophthalmic solution 1-2 drops 2 (two) times daily.     isosorbide  mononitrate (IMDUR ) 30 MG 24 hr tablet Take 1 tablet (30 mg total) by mouth daily. Please take with 60 mg dose to  make up a full 90 mg dose once a day. (Patient not taking: Reported on 12/07/2023) 90 tablet 3   oxyCODONE  (OXY IR/ROXICODONE ) 5 MG immediate release tablet Take 1 tablet (5 mg total) by mouth 2 (two) times daily as needed for moderate pain or severe pain (2.5mg  for moderate pain, 5mg  for severe pain). 10  tablet 0   sertraline  (ZOLOFT ) 100 MG tablet Take 100 mg by mouth daily.  (Patient taking differently: Take 200 mg by mouth daily. Pt takes 100 mg 2 tablets daily.)     No current facility-administered medications for this visit.   Facility-Administered Medications Ordered in Other Visits  Medication Dose Route Frequency Provider Last Rate Last Admin   aminophylline  injection 75 mg  75 mg Intravenous BID PRN McLean, Dalton S, MD   75 mg at 08/08/14 1335    Allergies:   Niacin, Duloxetine, Lisinopril , Amoxicillin, Ibuprofen , and Lipitor [atorvastatin ]    Social History:  The patient  reports that she quit smoking about 19 years ago. Her smoking use included cigarettes. She started smoking about 34 years ago. She has a 7.5 pack-year smoking history. She has never used smokeless tobacco. She reports that she does not drink alcohol and does not use drugs.   Family History:  The patient's family history includes Cancer in her sister; Coronary artery disease (age of onset: 54) in her sister; Coronary artery disease (age of onset: 27) in her mother; Deep vein thrombosis in her cousin; Heart attack (age of onset: 93) in her father.    ROS:  Please see the history of present illness.   Otherwise, review of systems are positive for none.    All other systems are reviewed and negative.    PHYSICAL EXAM: VS:  BP (!) 122/58 (BP Location: Right Arm, Patient Position: Sitting, Cuff Size: Normal)   Pulse (!) 46   Ht 5' 7 (1.702 m)   Wt 190 lb (86.2 kg)   SpO2 98%   BMI 29.76 kg/m  , BMI Body mass index is 29.76 kg/m. GEN: Well nourished, well developed, in no acute distress  HEENT: normal  Neck: no JVD, or masses.  Bilateral carotid bruits Cardiac: RRR; no murmurs, rubs, or gallops,no edema  Respiratory:  clear to auscultation bilaterally, normal work of breathing GI: soft, nontender, nondistended, + BS MS: no deformity or atrophy  Skin: warm and dry, no rash Neuro:  Strength and sensation are intact Psych: euthymic mood, full affect Vascular: Femoral pulses normal bilaterally.  Distal pulses are not palpable.  Radial pulses +1 on the right and +2 on the left.   EKG:  EKG is ordered today. EKG showed : Sinus rhythm with Premature atrial complexes Left axis deviation Anterior infarct , age undetermined When compared with ECG of 16-Sep-2023 16:37, Premature ventricular complexes are no longer Present Premature atrial complexes are now Present QRS axis Shifted left    Recent Labs: No results found for requested labs within last 365 days.    Lipid Panel    Component Value Date/Time   CHOL 86 (L) 01/27/2016 1034   TRIG 134 01/27/2016 1034   HDL 29 (L) 01/27/2016 1034   CHOLHDL 3.0 01/27/2016 1034   CHOLHDL 3.6 09/06/2015 0957   VLDL 20 09/06/2015 0957   LDLCALC 30 01/27/2016 1034      Wt Readings from Last 3 Encounters:  12/07/23 190 lb (86.2 kg)  10/21/23 191 lb 6.4 oz (86.8 kg)  09/16/23 190 lb 6.4 oz (86.4 kg)       ASSESSMENT AND PLAN:  1.  Peripheral arterial disease: She has known bilateral popliteal artery occlusion.  However, she now has significant progression of left calf claudication which is happening with minimal walking.  In addition, she has a small superficial ulceration between the  4th and 5th toes.  Her left toe pressure is less  than 60 and not optimal for tissue healing.  As such, I recommend proceeding with abdominal aortogram with lower extremity angiography and possible endovascular intervention.  I discussed the procedure in details as well as risks and benefits.  2. Coronary artery disease status post CABG and PCI with other forms of angina:    No significant change in symptoms.  3.  Bilateral carotid bruits with borderline stenosis on the right side: Recent carotid Doppler showed mild nonobstructive disease bilaterally.  4. Hyperlipidemia: Most recent lipid profile showed an LDL of 53.  Continue treatment with rosuvastatin  40 mg daily.  5.  Essential hypertension: Blood pressure is well-controlled on current medications.   Disposition:   Proceed with abdominal aortogram with lower extremity angiography and follow-up after.  Signed,  Deatrice Cage, MD  12/07/2023 9:41 AM    [facility]

## 2023-12-07 NOTE — H&P (View-Only) (Signed)
 Cardiology Office Note   Date:  12/07/2023   ID:  Taylor [name], DOB Aug 08, 1945, MRN [id number]  PCP:  Okey Carlin Redbird, MD  Cardiologist:  Dr. Michele  No chief complaint on file.     History of Present Illness: Taylor [name] is a 78 y.o. female who was referred for evaluation management of peripheral arterial disease. She was seen by me in the past for this but has not been seen since 2020. She has known history of CAD s/p CABG in 2006, pulmonary embolism in 2014, breast cancer status post left mastectomy in 2011, hyperlipidemia, type 2 diabetes, sleep apnea on CPAP and hypertension. She is not a smoker. She was seen in the past for bilateral calf claudication. Lower extremity angiography in November 2016 showed bilateral popliteal artery occlusion. She was treated medically without revascularization.  She presented with unstable angina in February 2017. She was found to have critical mid RCA stenosis which was treated by PCI and drug-eluting stent placement.   She had no significant improvement in symptoms with cilostazol .    She underwent lower extremity arterial Doppler studies in October which showed an ABI of 0.67 on the right and 0.48 on the left.  Great toe pressure was 40 mmHg on the left. Duplex showed monophasic waveforms in the common femoral arteries likely with bilateral popliteal artery occlusion.  She reports significant worsening of left calf claudication over the last few months which is now happening with minimal walking.  Symptoms on the right are stable.  She is also having issues with her lower back that she thinks is due to sciatica.  She had a small superficial ulceration between the 4th and 5th toes.  Past Medical History:  Diagnosis Date   Cancer of left breast (HCC)    Complication of anesthesia    Coronary artery disease    a. 2006 s/p CABG x 4 (LIMA->LAD, VG->Diag->OM, VG->RPDA);  b. 02/2015: [place]  s/p DES to [facility], VG->PDA & VG->D1->OM 100; c.  06/2015 Cath: diffuse dzs->Med rx; d. 11/2015 Cath/PCI: LM nl, LAD 100ost, 95d, D1 90ost, LCX 35ost, 65p, OM2 40, lat OM2 90, RCA 95p (2.75x16 Synergy DES), patent mid stent, RPLB 70, LIMA->LAD ok.EF55-65%.   Depression    Hx of tobacco use, presenting hazards to health    a. quit 2006.   Hypercholesteremia    Hypertension    Hypertensive heart disease    Echo 04/2019: Inferior HK, EF 55, normal RV SF, RVSP normal at 29.2, mild BAE, trivial MR, mild aortic valve sclerosis without stenosis   Left carotid bruit    a. 09/2014 Carotid U/S: 1-39% bilat ICA stenosis.   PAD (peripheral artery disease)    a. 11/2014 ABI: R: 0.63, L 0.59;  b. 11/2014 Periph Angio: bilat pop occlusions. L - short w/ reconstituion via collats in dist pop w/ 2 vessel runoff, R long w/ reconstitution in prox tib/peroneal arteries-->med Rx w/ pletal .   Pulmonary embolism (HCC) a. 2014.   Reflux esophagitis    Type II diabetes mellitus (HCC)     Past Surgical History:  Procedure Laterality Date   BREAST BIOPSY Left ~ 2012   BUNIONECTOMY Left 1970s   CARDIAC CATHETERIZATION  04/2004   CARDIAC CATHETERIZATION N/A 02/21/2015   Procedure: Left Heart Cath and Cors/Grafts Angiography;  Surgeon: Victory LELON Sharps, MD;  Location: [facility] INVASIVE CV LAB;  Service: Cardiovascular;  Laterality: N/A;   CARDIAC CATHETERIZATION N/A 02/21/2015   Procedure: Coronary Stent Intervention;  Surgeon: Victory  LELON Sharps, MD;  Location: [facility] INVASIVE CV LAB;  Service: Cardiovascular;  Laterality: N/A;   CARDIAC CATHETERIZATION N/A 02/21/2015   Procedure: Intravascular Pressure Wire/FFR Study;  Surgeon: Victory LELON Sharps, MD;  Location: [facility] INVASIVE CV LAB;  Service: Cardiovascular;  Laterality: N/A;   CARDIAC CATHETERIZATION N/A 06/20/2015   Procedure: Left Heart Cath and Cors/Grafts Angiography;  Surgeon: Ezra GORMAN Shuck, MD;  Location: [facility] INVASIVE CV LAB;  Service: Cardiovascular;  Laterality: N/A;   CARDIAC CATHETERIZATION N/A 11/19/2015   Procedure: Left Heart Cath  and Cors/Grafts Angiography;  Surgeon: Peter M Jordan, MD;  Location: [facility] INVASIVE CV LAB;  Service: Cardiovascular;  Laterality: N/A;   CARDIAC CATHETERIZATION N/A 11/19/2015   Procedure: Coronary Stent Intervention;  Surgeon: Peter M Jordan, MD;  Location: [facility] INVASIVE CV LAB;  Service: Cardiovascular;  Laterality: N/A;   CATARACT EXTRACTION W/ INTRAOCULAR LENS  IMPLANT, BILATERAL Bilateral 1980s-1990s   CORONARY ANGIOPLASTY WITH STENT PLACEMENT  02/21/2015   1 stent   CORONARY ARTERY BYPASS GRAFT  05/2004   CABG X4   EYE SURGERY     LAPAROSCOPIC CHOLECYSTECTOMY  1990s   MASTECTOMY Left ~ 2012   PERIPHERAL VASCULAR CATHETERIZATION N/A 12/12/2014   Procedure: Abdominal Aortogram;  Surgeon: Deatrice DELENA Cage, MD;  Location: [facility] INVASIVE CV LAB;  Service: Cardiovascular;  Laterality: N/A;   RETINAL DETACHMENT SURGERY Right 1990s     Current Outpatient Medications  Medication Sig Dispense Refill   acetaminophen  (TYLENOL ) 500 MG tablet Take 1,000 mg by mouth every 6 (six) hours as needed for headache (pain).      ALPRAZolam  (XANAX ) 0.25 MG tablet Take 0.25 mg by mouth daily as needed for anxiety.      aspirin  81 MG tablet Take 81 mg by mouth daily.     buPROPion (WELLBUTRIN XL) 150 MG 24 hr tablet Take 150 mg by mouth daily.     clopidogrel  (PLAVIX ) 75 MG tablet Take 1 tablet (75 mg total) by mouth daily. 90 tablet 2   furosemide  (LASIX ) 20 MG tablet Take 1 tablet (20 mg total) by mouth daily as needed. For leg swelling. 90 tablet 3   irbesartan (AVAPRO) 150 MG tablet Take 150 mg by mouth daily.     isosorbide  mononitrate (IMDUR ) 60 MG 24 hr tablet Take 1 tablet by mouth once daily 90 tablet 2   levothyroxine  (SYNTHROID ) 75 MCG tablet Take 75 mcg by mouth daily before breakfast.     nitroGLYCERIN  (NITROSTAT ) 0.4 MG SL tablet Place 1 tablet (0.4 mg total) under the tongue every 5 (five) minutes as needed for chest pain. 25 tablet 3   pioglitazone (ACTOS) 45 MG tablet Take 45 mg by mouth daily.      repaglinide (PRANDIN) 2 MG tablet Take 2 mg by mouth 2 (two) times daily before a meal.     rosuvastatin  (CRESTOR ) 40 MG tablet Take 40 mg by mouth daily.     timolol  (BETIMOL ) 0.25 % ophthalmic solution 1-2 drops 2 (two) times daily.     isosorbide  mononitrate (IMDUR ) 30 MG 24 hr tablet Take 1 tablet (30 mg total) by mouth daily. Please take with 60 mg dose to  make up a full 90 mg dose once a day. (Patient not taking: Reported on 12/07/2023) 90 tablet 3   oxyCODONE  (OXY IR/ROXICODONE ) 5 MG immediate release tablet Take 1 tablet (5 mg total) by mouth 2 (two) times daily as needed for moderate pain or severe pain (2.5mg  for moderate pain, 5mg  for severe pain). 10  tablet 0   sertraline  (ZOLOFT ) 100 MG tablet Take 100 mg by mouth daily.  (Patient taking differently: Take 200 mg by mouth daily. Pt takes 100 mg 2 tablets daily.)     No current facility-administered medications for this visit.   Facility-Administered Medications Ordered in Other Visits  Medication Dose Route Frequency Provider Last Rate Last Admin   aminophylline  injection 75 mg  75 mg Intravenous BID PRN McLean, Dalton S, MD   75 mg at 08/08/14 1335    Allergies:   Niacin, Duloxetine, Lisinopril , Amoxicillin, Ibuprofen , and Lipitor [atorvastatin ]    Social History:  The patient  reports that she quit smoking about 19 years ago. Her smoking use included cigarettes. She started smoking about 34 years ago. She has a 7.5 pack-year smoking history. She has never used smokeless tobacco. She reports that she does not drink alcohol and does not use drugs.   Family History:  The patient's family history includes Cancer in her sister; Coronary artery disease (age of onset: 54) in her sister; Coronary artery disease (age of onset: 27) in her mother; Deep vein thrombosis in her cousin; Heart attack (age of onset: 93) in her father.    ROS:  Please see the history of present illness.   Otherwise, review of systems are positive for none.    All other systems are reviewed and negative.    PHYSICAL EXAM: VS:  BP (!) 122/58 (BP Location: Right Arm, Patient Position: Sitting, Cuff Size: Normal)   Pulse (!) 46   Ht 5' 7 (1.702 m)   Wt 190 lb (86.2 kg)   SpO2 98%   BMI 29.76 kg/m  , BMI Body mass index is 29.76 kg/m. GEN: Well nourished, well developed, in no acute distress  HEENT: normal  Neck: no JVD, or masses.  Bilateral carotid bruits Cardiac: RRR; no murmurs, rubs, or gallops,no edema  Respiratory:  clear to auscultation bilaterally, normal work of breathing GI: soft, nontender, nondistended, + BS MS: no deformity or atrophy  Skin: warm and dry, no rash Neuro:  Strength and sensation are intact Psych: euthymic mood, full affect Vascular: Femoral pulses normal bilaterally.  Distal pulses are not palpable.  Radial pulses +1 on the right and +2 on the left.   EKG:  EKG is ordered today. EKG showed : Sinus rhythm with Premature atrial complexes Left axis deviation Anterior infarct , age undetermined When compared with ECG of 16-Sep-2023 16:37, Premature ventricular complexes are no longer Present Premature atrial complexes are now Present QRS axis Shifted left    Recent Labs: No results found for requested labs within last 365 days.    Lipid Panel    Component Value Date/Time   CHOL 86 (L) 01/27/2016 1034   TRIG 134 01/27/2016 1034   HDL 29 (L) 01/27/2016 1034   CHOLHDL 3.0 01/27/2016 1034   CHOLHDL 3.6 09/06/2015 0957   VLDL 20 09/06/2015 0957   LDLCALC 30 01/27/2016 1034      Wt Readings from Last 3 Encounters:  12/07/23 190 lb (86.2 kg)  10/21/23 191 lb 6.4 oz (86.8 kg)  09/16/23 190 lb 6.4 oz (86.4 kg)       ASSESSMENT AND PLAN:  1.  Peripheral arterial disease: She has known bilateral popliteal artery occlusion.  However, she now has significant progression of left calf claudication which is happening with minimal walking.  In addition, she has a small superficial ulceration between the  4th and 5th toes.  Her left toe pressure is less  than 60 and not optimal for tissue healing.  As such, I recommend proceeding with abdominal aortogram with lower extremity angiography and possible endovascular intervention.  I discussed the procedure in details as well as risks and benefits.  2. Coronary artery disease status post CABG and PCI with other forms of angina:    No significant change in symptoms.  3.  Bilateral carotid bruits with borderline stenosis on the right side: Recent carotid Doppler showed mild nonobstructive disease bilaterally.  4. Hyperlipidemia: Most recent lipid profile showed an LDL of 53.  Continue treatment with rosuvastatin  40 mg daily.  5.  Essential hypertension: Blood pressure is well-controlled on current medications.   Disposition:   Proceed with abdominal aortogram with lower extremity angiography and follow-up after.  Signed,  Deatrice Cage, MD  12/07/2023 9:41 AM    [facility]

## 2023-12-08 DIAGNOSIS — E1142 Type 2 diabetes mellitus with diabetic polyneuropathy: Secondary | ICD-10-CM | POA: Diagnosis not present

## 2023-12-08 DIAGNOSIS — N183 Chronic kidney disease, stage 3 unspecified: Secondary | ICD-10-CM | POA: Diagnosis not present

## 2023-12-08 DIAGNOSIS — I10 Essential (primary) hypertension: Secondary | ICD-10-CM | POA: Diagnosis not present

## 2023-12-08 DIAGNOSIS — I739 Peripheral vascular disease, unspecified: Secondary | ICD-10-CM | POA: Diagnosis not present

## 2023-12-08 LAB — BASIC METABOLIC PANEL WITH GFR
BUN/Creatinine Ratio: 13 (ref 12–28)
BUN: 16 mg/dL (ref 8–27)
CO2: 22 mmol/L (ref 20–29)
Calcium: 9.8 mg/dL (ref 8.7–10.3)
Chloride: 103 mmol/L (ref 96–106)
Creatinine, Ser: 1.27 mg/dL — ABNORMAL HIGH (ref 0.57–1.00)
Glucose: 197 mg/dL — ABNORMAL HIGH (ref 70–99)
Potassium: 5 mmol/L (ref 3.5–5.2)
Sodium: 140 mmol/L (ref 134–144)
eGFR: 44 mL/min/1.73 — ABNORMAL LOW (ref >59)

## 2023-12-08 LAB — CBC
Hematocrit: 35.1 % (ref 34.0–46.6)
Hemoglobin: 11.3 g/dL (ref 11.1–15.9)
MCH: 30.5 pg (ref 26.6–33.0)
MCHC: 32.2 g/dL (ref 31.5–35.7)
MCV: 95 fL (ref 79–97)
Platelets: 244 x10E3/uL (ref 150–450)
RBC: 3.71 x10E6/uL — ABNORMAL LOW (ref 3.77–5.28)
RDW: 12.9 % (ref 11.7–15.4)
WBC: 6.1 x10E3/uL (ref 3.4–10.8)

## 2023-12-10 ENCOUNTER — Ambulatory Visit: Payer: Self-pay | Admitting: Cardiovascular Disease

## 2023-12-12 DIAGNOSIS — E7800 Pure hypercholesterolemia, unspecified: Secondary | ICD-10-CM | POA: Diagnosis not present

## 2023-12-12 DIAGNOSIS — I739 Peripheral vascular disease, unspecified: Secondary | ICD-10-CM | POA: Diagnosis not present

## 2023-12-12 DIAGNOSIS — N183 Chronic kidney disease, stage 3 unspecified: Secondary | ICD-10-CM | POA: Diagnosis not present

## 2023-12-12 DIAGNOSIS — I10 Essential (primary) hypertension: Secondary | ICD-10-CM | POA: Diagnosis not present

## 2023-12-12 DIAGNOSIS — E113292 Type 2 diabetes mellitus with mild nonproliferative diabetic retinopathy without macular edema, left eye: Secondary | ICD-10-CM | POA: Diagnosis not present

## 2023-12-12 DIAGNOSIS — E1142 Type 2 diabetes mellitus with diabetic polyneuropathy: Secondary | ICD-10-CM | POA: Diagnosis not present

## 2023-12-12 DIAGNOSIS — E039 Hypothyroidism, unspecified: Secondary | ICD-10-CM | POA: Diagnosis not present

## 2023-12-13 ENCOUNTER — Telehealth: Payer: Self-pay | Admitting: *Deleted

## 2023-12-13 NOTE — Telephone Encounter (Signed)
 Abdominal Aortogram scheduled at [facility] for: Wednesday December 22, 2023 12:30 PM Arrival time [facility] Main Entrance A at: 7:30 AM-pre-procedure hydration  Diet: -May have light meal until 6:30 AM. (6 hours before procedure time) Approved light meal consists of plain toast, fruit, light soups, crackers.  Hydration: -May drink clear liquids until 2 hours before the procedure.  Approved liquids: Water, clear tea, black coffee, fruit juices-non-citric and without pulp,Gatorade, plain Jello/popsicles.  No PO hydration (16 oz water)-going in early for IVF  Medication instructions: -Hold:  Lasix /Avapro-day before and day of procedure -per protocol GFR <60 (44)  Prandin/Actos-AM of procedure -Other usual morning medications can be taken including aspirin  81 mg and Plavix  75 mg  Plan to go home the same day, you will only stay overnight if medically necessary.  You must have responsible adult to drive you home.  Someone must be with you the first 24 hours after you arrive home.  Reviewed procedure instructions/pre-procedure hydration with patient.

## 2023-12-13 NOTE — Progress Notes (Signed)
 Patient aware day of procedure 12/22/23 to arrive at 7:30 AM for hydration prior to procedure at 12:30 PM

## 2023-12-20 ENCOUNTER — Telehealth: Payer: Self-pay | Admitting: *Deleted

## 2023-12-20 NOTE — Telephone Encounter (Signed)
 Abdominal Aortogram scheduled at [facility] for: Wednesday December 22, 2023 12 :30 PM Arrival time [facility] Main Entrance A at: 7:30 AM-pre-procedure hydration  Diet:  -May have light meal until 6:30 AM. (6 hours before procedure time) Approved light meal consists of plain toast, fruit, light soups, crackers.  Hydration: -May drink clear liquids until 2 hours before the procedure.  Approved liquids: Water, clear tea, black coffee, fruit juices-non-citric and without pulp,Gatorade, plain Jello/popsicles.  Medication instructions: -Hold:  Lasix /irbesartan-day before and day of procedure  Prandin/Actos-AM of procedure -Other usual morning medications can be taken including aspirin  81 mg and Plavix  75 mg.  Plan to go home the same day, you will only stay overnight if medically necessary.  You must have responsible adult to drive you home.  Someone must be with you the first 24 hours after you arrive home.  Reviewed procedure instructions/pre-procedure hydration with patient.

## 2023-12-22 ENCOUNTER — Encounter (HOSPITAL_COMMUNITY): Admission: RE | Disposition: A | Payer: Self-pay | Source: Home / Self Care | Attending: Cardiovascular Disease

## 2023-12-22 ENCOUNTER — Other Ambulatory Visit: Payer: Self-pay

## 2023-12-22 ENCOUNTER — Ambulatory Visit (HOSPITAL_COMMUNITY)
Admission: RE | Admit: 2023-12-22 | Discharge: 2023-12-22 | Disposition: A | Attending: Cardiovascular Disease | Admitting: Cardiovascular Disease

## 2023-12-22 DIAGNOSIS — I739 Peripheral vascular disease, unspecified: Secondary | ICD-10-CM

## 2023-12-22 HISTORY — PX: ABDOMINAL AORTOGRAM W/LOWER EXTREMITY: CATH118223

## 2023-12-22 LAB — GLUCOSE, CAPILLARY: Glucose-Capillary: 151 mg/dL — ABNORMAL HIGH (ref 70–99)

## 2023-12-22 SURGERY — ABDOMINAL AORTOGRAM W/LOWER EXTREMITY
Anesthesia: LOCAL

## 2023-12-22 MED ORDER — ONDANSETRON HCL 4 MG/2ML IJ SOLN: 4.0000 mg | Freq: Four times a day (QID) | INTRAMUSCULAR | Status: DC | PRN

## 2023-12-22 MED ORDER — SODIUM CHLORIDE 0.9 % WEIGHT BASED INFUSION: 1.0000 mL/kg/h | INTRAVENOUS | Status: DC

## 2023-12-22 MED ORDER — SODIUM CHLORIDE 0.9 % IV SOLN: 250.0000 mL | INTRAVENOUS | Status: DC | PRN

## 2023-12-22 MED ORDER — SODIUM CHLORIDE 0.9% FLUSH: 3.0000 mL | Freq: Two times a day (BID) | INTRAVENOUS | Status: DC

## 2023-12-22 MED ORDER — SODIUM CHLORIDE 0.9 % WEIGHT BASED INFUSION
3.0000 mL/kg/h | INTRAVENOUS | Status: AC
  Administered 2023-12-22: 3 mL/kg/h via INTRAVENOUS

## 2023-12-22 MED ORDER — LIDOCAINE HCL (PF) 1 % IJ SOLN
INTRAMUSCULAR | Status: AC
  Filled 2023-12-22: qty 30

## 2023-12-22 MED ORDER — MIDAZOLAM HCL 2 MG/2ML IJ SOLN
INTRAMUSCULAR | Status: AC
  Filled 2023-12-22: qty 2

## 2023-12-22 MED ORDER — ASPIRIN 81 MG PO CHEW: 81.0000 mg | CHEWABLE_TABLET | ORAL | Status: DC

## 2023-12-22 MED ORDER — LIDOCAINE HCL (PF) 1 % IJ SOLN
INTRAMUSCULAR | Status: DC | PRN
  Administered 2023-12-22: 15 mL via INTRADERMAL

## 2023-12-22 MED ORDER — FENTANYL CITRATE (PF) 100 MCG/2ML IJ SOLN
INTRAMUSCULAR | Status: DC | PRN
  Administered 2023-12-22: 25 ug via INTRAVENOUS

## 2023-12-22 MED ORDER — SODIUM CHLORIDE 0.9 % IV SOLN: INTRAVENOUS | Status: AC

## 2023-12-22 MED ORDER — SODIUM CHLORIDE 0.9% FLUSH: 3.0000 mL | INTRAVENOUS | Status: DC | PRN

## 2023-12-22 MED ORDER — IODIXANOL 320 MG/ML IV SOLN
INTRAVENOUS | Status: DC | PRN
  Administered 2023-12-22: 70 mL

## 2023-12-22 MED ORDER — MIDAZOLAM HCL (PF) 2 MG/2ML IJ SOLN
INTRAMUSCULAR | Status: DC | PRN
  Administered 2023-12-22: 1 mg via INTRAVENOUS

## 2023-12-22 MED ORDER — FENTANYL CITRATE (PF) 100 MCG/2ML IJ SOLN
INTRAMUSCULAR | Status: AC
  Filled 2023-12-22: qty 2

## 2023-12-22 MED ORDER — ACETAMINOPHEN 325 MG PO TABS: 650.0000 mg | ORAL_TABLET | ORAL | Status: DC | PRN

## 2023-12-22 MED ORDER — HEPARIN (PORCINE) IN NACL 1000-0.9 UT/500ML-% IV SOLN
INTRAVENOUS | Status: DC | PRN
  Administered 2023-12-22: 1000 mL

## 2023-12-22 SURGICAL SUPPLY — 11 items
CATH ANGIO 5F PIGTAIL 65CM (CATHETERS) IMPLANT
CATH CROSS OVER TEMPO 5F (CATHETERS) IMPLANT
CATH TEMPO 5F STRAIGHT 65 (CATHETERS) IMPLANT
DEVICE CLOSURE MYNXGRIP 5F (Vascular Products) IMPLANT
KIT MICROPUNCTURE NIT STIFF (SHEATH) IMPLANT
KIT SINGLE USE MANIFOLD (KITS) IMPLANT
PACK CARDIAC CATHETERIZATION (CUSTOM PROCEDURE TRAY) IMPLANT
SET ATX-X65L (MISCELLANEOUS) IMPLANT
SHEATH PINNACLE 5F 10CM (SHEATH) IMPLANT
SHEATH PROBE COVER 6X72 (BAG) IMPLANT
WIRE HITORQ VERSACORE ST 145CM (WIRE) IMPLANT

## 2023-12-22 NOTE — Interval H&P Note (Signed)
 History and Physical Interval Note:  12/22/2023 12:15 PM  Taylor [name]  has presented today for surgery, with the diagnosis of pad.  The various methods of treatment have been discussed with the patient and family. After consideration of risks, benefits and other options for treatment, the patient has consented to  Procedure(s): ABDOMINAL AORTOGRAM W/LOWER EXTREMITY (N/A) as a surgical intervention.  The patient's history has been reviewed, patient examined, no change in status, stable for surgery.  I have reviewed the patient's chart and labs.  Questions were answered to the patient's satisfaction.     Kimmarie Pascale

## 2023-12-22 NOTE — Discharge Instructions (Signed)
 Femoral Site Care This sheet gives you information about how to care for yourself after your procedure. Your health care provider may also give you more specific instructions. If you have problems or questions, contact your health care provider. What can I expect after the procedure?  After the procedure, it is common to have: Bruising that usually fades within 1-2 weeks. Tenderness at the site. Follow these instructions at home: Wound care Follow instructions from your health care provider about how to take care of your insertion site. Make sure you: Wash your hands with soap and water  before you change your bandage (dressing). If soap and water  are not available, use hand sanitizer. Remove your dressing as told by your health care provider. 24 hours Do not take baths, swim, or use a hot tub until your health care provider approves. You may shower 24-48 hours after the procedure or as told by your health care provider. Gently wash the site with plain soap and water . Pat the area dry with a clean towel. Do not rub the site. This may cause bleeding. Do not apply powder or lotion to the site. Keep the site clean and dry. Check your femoral site every day for signs of infection. Check for: Redness, swelling, or pain. Fluid or blood. Warmth. Pus or a bad smell. Activity For the first 2-3 days after your procedure, or as long as directed: Avoid climbing stairs as much as possible. Do not squat. Do not lift anything that is heavier than 10 lb (4.5 kg), or the limit that you are told, until your health care provider says that it is safe. For 5 days Rest as directed. Avoid sitting for a long time without moving. Get up to take short walks every 1-2 hours. Do not drive for 24 hours if you were given a medicine to help you relax (sedative). General instructions Take over-the-counter and prescription medicines only as told by your health care provider. Keep all follow-up visits as told by your  health care provider. This is important. Contact a health care provider if you have: A fever or chills. You have redness, swelling, or pain around your insertion site. Get help right away if: The catheter insertion area swells very fast. You pass out. You suddenly start to sweat or your skin gets clammy. The catheter insertion area is bleeding, and the bleeding does not stop when you hold steady pressure on the area. The area near or just beyond the catheter insertion site becomes pale, cool, tingly, or numb. These symptoms may represent a serious problem that is an emergency. Do not wait to see if the symptoms will go away. Get medical help right away. Call your local emergency services (911 in the [place] Do not drive yourself to the hospital. Summary After the procedure, it is common to have bruising that usually fades within 1-2 weeks. Check your femoral site every day for signs of infection. Do not lift anything that is heavier than 10 lb (4.5 kg), or the limit that you are told, until your health care provider says that it is safe. This information is not intended to replace advice given to you by your health care provider. Make sure you discuss any questions you have with your health care provider. Document Revised: 01/11/2017 Document Reviewed: 01/11/2017 Elsevier Patient Education  2020 [employer].

## 2023-12-22 NOTE — Progress Notes (Signed)
 Patient ambulated to the bathroom and in the hall. Patient was able to void without difficulty. Right groin site intact. No bleeding or hematoma noted.

## 2023-12-23 ENCOUNTER — Encounter (HOSPITAL_COMMUNITY): Payer: Self-pay | Admitting: Cardiovascular Disease

## 2023-12-29 DIAGNOSIS — Z1159 Encounter for screening for other viral diseases: Secondary | ICD-10-CM | POA: Diagnosis not present

## 2023-12-29 DIAGNOSIS — C4361 Malignant melanoma of right upper limb, including shoulder: Secondary | ICD-10-CM | POA: Diagnosis not present

## 2023-12-29 DIAGNOSIS — Z79899 Other long term (current) drug therapy: Secondary | ICD-10-CM | POA: Diagnosis not present

## 2023-12-29 DIAGNOSIS — K5730 Diverticulosis of large intestine without perforation or abscess without bleeding: Secondary | ICD-10-CM | POA: Diagnosis not present

## 2023-12-29 DIAGNOSIS — R911 Solitary pulmonary nodule: Secondary | ICD-10-CM | POA: Diagnosis not present

## 2024-01-03 ENCOUNTER — Telehealth: Payer: Self-pay | Admitting: Cardiology

## 2024-01-03 DIAGNOSIS — I739 Peripheral vascular disease, unspecified: Secondary | ICD-10-CM

## 2024-01-03 DIAGNOSIS — G4733 Obstructive sleep apnea (adult) (pediatric): Secondary | ICD-10-CM

## 2024-01-03 DIAGNOSIS — I10 Essential (primary) hypertension: Secondary | ICD-10-CM

## 2024-01-03 DIAGNOSIS — I25118 Atherosclerotic heart disease of native coronary artery with other forms of angina pectoris: Secondary | ICD-10-CM

## 2024-01-03 NOTE — Telephone Encounter (Signed)
 Pt called in requesting an order for a new CPAP machine. She states her machine states her motor is running down.   Pt states she uses [facility] phone: [phone]

## 2024-01-09 ENCOUNTER — Encounter: Payer: Self-pay | Admitting: Cardiology

## 2024-01-11 NOTE — Telephone Encounter (Signed)
 Spoke with patient regarding cpap. Pt states she also called last week requesting form be faxed over to insurance for new cpap machine. Routed message to sleep studies. Also apologies sent relating to possible delay with the holidays and over the weekend. Pt verbalized understanding of plan and awaiting for someone to update her when complete.

## 2024-01-11 NOTE — Telephone Encounter (Signed)
 Message sent to Dr Shlomo to write the Rx.

## 2024-01-11 NOTE — Telephone Encounter (Signed)
 Patient's spouse is calling to follow up on getting new CPAP machine. Please advise.

## 2024-01-12 NOTE — Addendum Note (Signed)
 Addended by: JOSHUA DALTON MATSU on: 01/12/2024 12:32 PM   Modules accepted: Orders

## 2024-01-12 NOTE — Telephone Encounter (Addendum)
 Order placed to [facility] [employer] via community message for .SABRA   Please order new ResMed BIPAP with IPAP max 18cm H2O, EPAP min 5cm H2O and PS 4cm H2O with heated humidity

## 2024-01-18 ENCOUNTER — Ambulatory Visit: Admitting: Cardiovascular Disease

## 2024-02-01 ENCOUNTER — Encounter: Payer: Self-pay | Admitting: Cardiovascular Disease

## 2024-02-01 ENCOUNTER — Ambulatory Visit: Admitting: Cardiovascular Disease

## 2024-02-01 VITALS — BP 130/50 | HR 62 | Ht 67.0 in | Wt 193.0 lb

## 2024-02-01 DIAGNOSIS — I6523 Occlusion and stenosis of bilateral carotid arteries: Secondary | ICD-10-CM | POA: Diagnosis not present

## 2024-02-01 DIAGNOSIS — I10 Essential (primary) hypertension: Secondary | ICD-10-CM | POA: Insufficient documentation

## 2024-02-01 DIAGNOSIS — I739 Peripheral vascular disease, unspecified: Secondary | ICD-10-CM | POA: Insufficient documentation

## 2024-02-01 DIAGNOSIS — I701 Atherosclerosis of renal artery: Secondary | ICD-10-CM | POA: Diagnosis not present

## 2024-02-01 DIAGNOSIS — E785 Hyperlipidemia, unspecified: Secondary | ICD-10-CM | POA: Insufficient documentation

## 2024-02-01 DIAGNOSIS — I25118 Atherosclerotic heart disease of native coronary artery with other forms of angina pectoris: Secondary | ICD-10-CM | POA: Diagnosis not present

## 2024-02-01 NOTE — Patient Instructions (Signed)
 Medication Instructions:  No changes *If you need a refill on your cardiac medications before your next appointment, please call your pharmacy*  Lab Work: None ordered If you have labs (blood work) drawn today and your tests are completely normal, you will receive your results only by: MyChart Message (if you have MyChart) OR A paper copy in the mail If you have any lab test that is abnormal or we need to change your treatment, we will call you to review the results.  Testing/Procedures: None ordered  Follow-Up: At [facility], you and your health needs are our priority.  As part of our continuing mission to provide you with exceptional heart care, our providers are all part of one team.  This team includes your primary Cardiologist (physician) and Advanced Practice Providers or APPs (Physician Assistants and Nurse Practitioners) who all work together to provide you with the care you need, when you need it.  Your next appointment:   6 month(s)  Provider:   Dr. Darron  We recommend signing up for the patient portal called MyChart.  Sign up information is provided on this After Visit Summary.  MyChart is used to connect with patients for Virtual Visits (Telemedicine).  Patients are able to view lab/test results, encounter notes, upcoming appointments, etc.  Non-urgent messages can be sent to your provider as well.   To learn more about what you can do with MyChart, go to [email or web address].   Other Instructions A referral has been placed to the Vascular rehab program. They will call you to set up an appointment.

## 2024-02-01 NOTE — Progress Notes (Signed)
 "    Cardiology Office Note   Date:  02/01/2024   ID:  Taylor [name], DOB 09-06-1945, MRN [id number]  PCP:  Okey Carlin Redbird, MD  Cardiologist:  Dr. Michele  No chief complaint on file.     History of Present Illness: Taylor [name] is a 79 y.o. female who was referred for evaluation management of peripheral arterial disease. She has known history of CAD s/p CABG in 2006, pulmonary embolism in 2014, breast cancer status post left mastectomy in 2011, hyperlipidemia, type 2 diabetes, sleep apnea on CPAP and hypertension. She is not a smoker. She was seen in the past for bilateral calf claudication. Lower extremity angiography in November 2016 showed bilateral popliteal artery occlusion. She was treated medically without revascularization.  She presented with unstable angina in February 2017. She was found to have critical mid RCA stenosis which was treated by PCI and drug-eluting stent placement.   She had no significant improvement in symptoms with cilostazol .    She underwent lower extremity arterial Doppler studies in October, 2024 which showed an ABI of 0.67 on the right and 0.48 on the left.  Great toe pressure was 40 mmHg on the left. Duplex showed monophasic waveforms in the common femoral arteries likely with bilateral popliteal artery occlusion.  She reported significant worsening of left calf claudication. She had a small superficial ulceration between the 4th and 5th toes. I proceeded with angiography in December which showed occluded celiac trunk with significant bilateral renal artery stenosis.  On the right side, there was mild iliac and SFA disease with occlusion of the popliteal artery with three-vessel runoff below the knee.  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  Her blood  pressure has been controlled with no medications.  She rarely has to take furosemide .  She has no postprandial pain.  She has bilateral lower extremity claudication after walking 1-2 blocks.  Past Medical History:  Diagnosis Date   Cancer of left breast (HCC)    Complication of anesthesia    Coronary artery disease    a. 2006 s/p CABG x 4 (LIMA->LAD, VG->Diag->OM, VG->RPDA);  b. 02/2015: [place]  s/p DES to [facility], VG->PDA & VG->D1->OM 100; c. 06/2015 Cath: diffuse dzs->Med rx; d. 11/2015 Cath/PCI: LM nl, LAD 100ost, 95d, D1 90ost, LCX 35ost, 65p, OM2 40, lat OM2 90, RCA 95p (2.75x16 Synergy DES), patent mid stent, RPLB 70, LIMA->LAD ok.EF55-65%.   Depression    Hx of tobacco use, presenting hazards to health    a. quit 2006.   Hypercholesteremia    Hypertension    Hypertensive heart disease    Echo 04/2019: Inferior HK, EF 55, normal RV SF, RVSP normal at 29.2, mild BAE, trivial MR, mild aortic valve sclerosis without stenosis   Left carotid bruit    a. 09/2014 Carotid U/S: 1-39% bilat ICA stenosis.   PAD (peripheral artery disease)    a. 11/2014 ABI: R: 0.63, L 0.59;  b. 11/2014 Periph Angio: bilat pop occlusions. L - short w/ reconstituion via collats in dist pop w/ 2 vessel runoff, R long w/ reconstitution in prox tib/peroneal arteries-->med Rx w/ pletal .   Pulmonary embolism (HCC) a. 2014.   Reflux esophagitis    Type II diabetes mellitus (HCC)     Past Surgical History:  Procedure Laterality Date   ABDOMINAL  AORTOGRAM W/LOWER EXTREMITY N/A 12/22/2023   Procedure: ABDOMINAL AORTOGRAM W/LOWER EXTREMITY;  Surgeon: Darron Deatrice LABOR, MD;  Location: [facility] INVASIVE CV LAB;  Service: Cardiovascular;  Laterality: N/A;   BREAST BIOPSY Left ~ 2012   BUNIONECTOMY Left 1970s   CARDIAC CATHETERIZATION  04/2004   CARDIAC CATHETERIZATION N/A 02/21/2015   Procedure: Left Heart Cath and Cors/Grafts Angiography;  Surgeon: Victory LELON Sharps, MD;  Location: [facility] INVASIVE CV LAB;  Service: Cardiovascular;  Laterality:  N/A;   CARDIAC CATHETERIZATION N/A 02/21/2015   Procedure: Coronary Stent Intervention;  Surgeon: Victory LELON Sharps, MD;  Location: [facility] INVASIVE CV LAB;  Service: Cardiovascular;  Laterality: N/A;   CARDIAC CATHETERIZATION N/A 02/21/2015   Procedure: Intravascular Pressure Wire/FFR Study;  Surgeon: Victory LELON Sharps, MD;  Location: [facility] INVASIVE CV LAB;  Service: Cardiovascular;  Laterality: N/A;   CARDIAC CATHETERIZATION N/A 06/20/2015   Procedure: Left Heart Cath and Cors/Grafts Angiography;  Surgeon: Ezra GORMAN Shuck, MD;  Location: [facility] INVASIVE CV LAB;  Service: Cardiovascular;  Laterality: N/A;   CARDIAC CATHETERIZATION N/A 11/19/2015   Procedure: Left Heart Cath and Cors/Grafts Angiography;  Surgeon: Peter M Jordan, MD;  Location: [facility] INVASIVE CV LAB;  Service: Cardiovascular;  Laterality: N/A;   CARDIAC CATHETERIZATION N/A 11/19/2015   Procedure: Coronary Stent Intervention;  Surgeon: Peter M Jordan, MD;  Location: [facility] INVASIVE CV LAB;  Service: Cardiovascular;  Laterality: N/A;   CATARACT EXTRACTION W/ INTRAOCULAR LENS  IMPLANT, BILATERAL Bilateral 1980s-1990s   CORONARY ANGIOPLASTY WITH STENT PLACEMENT  02/21/2015   1 stent   CORONARY ARTERY BYPASS GRAFT  05/2004   CABG X4   EYE SURGERY     LAPAROSCOPIC CHOLECYSTECTOMY  1990s   MASTECTOMY Left ~ 2012   PERIPHERAL VASCULAR CATHETERIZATION N/A 12/12/2014   Procedure: Abdominal Aortogram;  Surgeon: Deatrice LABOR Darron, MD;  Location: [facility] INVASIVE CV LAB;  Service: Cardiovascular;  Laterality: N/A;   RETINAL DETACHMENT SURGERY Right 1990s     Current Outpatient Medications  Medication Sig Dispense Refill   acetaminophen  (TYLENOL ) 500 MG tablet Take 1,000 mg by mouth every 6 (six) hours as needed for headache (pain).      ALPRAZolam  (XANAX ) 0.25 MG tablet Take 0.25 mg by mouth daily as needed for anxiety.      aspirin  81 MG tablet Take 81 mg by mouth daily.     buPROPion (WELLBUTRIN XL) 150 MG 24 hr tablet Take 150 mg by mouth daily.     clopidogrel   (PLAVIX ) 75 MG tablet Take 1 tablet (75 mg total) by mouth daily. 90 tablet 2   cromolyn (OPTICROM) 4 % ophthalmic solution Place 1 drop into both eyes daily as needed.     dorzolamide -timolol  (COSOPT) 2-0.5 % ophthalmic solution Apply 1 drop to eye.     furosemide  (LASIX ) 20 MG tablet Take 1 tablet (20 mg total) by mouth daily as needed. For leg swelling. 90 tablet 3   irbesartan (AVAPRO) 150 MG tablet Take 150 mg by mouth daily.     levothyroxine  (SYNTHROID ) 75 MCG tablet Take 75 mcg by mouth daily before breakfast.     nitroGLYCERIN  (NITROSTAT ) 0.4 MG SL tablet Place 1 tablet (0.4 mg total) under the tongue every 5 (five) minutes as needed for chest pain. 25 tablet 3   pioglitazone (ACTOS) 45 MG tablet Take 45 mg by mouth daily.     repaglinide (PRANDIN) 2 MG tablet Take 2 mg by mouth 2 (two) times daily before a meal.     rosuvastatin  (CRESTOR ) 40 MG  tablet Take 40 mg by mouth daily.     sertraline  (ZOLOFT ) 100 MG tablet Take 100 mg by mouth daily.  (Patient taking differently: Take 200 mg by mouth daily.)     timolol  (BETIMOL ) 0.25 % ophthalmic solution Place 1 drop into both eyes 2 (two) times daily.     isosorbide  mononitrate (IMDUR ) 60 MG 24 hr tablet Take 1 tablet by mouth once daily 90 tablet 2   No current facility-administered medications for this visit.   Facility-Administered Medications Ordered in Other Visits  Medication Dose Route Frequency Provider Last Rate Last Admin   aminophylline  injection 75 mg  75 mg Intravenous BID PRN McLean, Dalton S, MD   75 mg at 08/08/14 1335    Allergies:   Niacin, Lisinopril , Amoxicillin, Duloxetine, Ibuprofen , and Lipitor [atorvastatin ]    Social History:  The patient  reports that she quit smoking about 19 years ago. Her smoking use included cigarettes. She started smoking about 34 years ago. She has a 7.5 pack-year smoking history. She has never used smokeless tobacco. She reports that she does not drink alcohol and does not use drugs.    Family History:  The patient's family history includes Cancer in her sister; Coronary artery disease (age of onset: 65) in her sister; Coronary artery disease (age of onset: 66) in her mother; Deep vein thrombosis in her cousin; Heart attack (age of onset: 45) in her father.    ROS:  Please see the history of present illness.   Otherwise, review of systems are positive for none.   All other systems are reviewed and negative.    PHYSICAL EXAM: VS:  BP (!) 130/50 (BP Location: Left Arm, Patient Position: Sitting, Cuff Size: Normal)   Pulse 62   Ht 5' 7 (1.702 m)   Wt 193 lb (87.5 kg)   SpO2 95%   BMI 30.23 kg/m  , BMI Body mass index is 30.23 kg/m. GEN: Well nourished, well developed, in no acute distress  HEENT: normal  Neck: no JVD, or masses.  Bilateral carotid bruits Cardiac: RRR; no murmurs, rubs, or gallops,no edema  Respiratory:  clear to auscultation bilaterally, normal work of breathing GI: soft, nontender, nondistended, + BS MS: no deformity or atrophy  Skin: warm and dry, no rash Neuro:  Strength and sensation are intact Psych: euthymic mood, full affect Vascular: Femoral pulses normal bilaterally.  Distal pulses are not palpable.  No groin hematoma   EKG:  EKG is not ordered today.   Recent Labs: 12/07/2023: BUN 16; Creatinine, Ser 1.27; Hemoglobin 11.3; Platelets 244; Potassium 5.0; Sodium 140    Lipid Panel    Component Value Date/Time   CHOL 86 (L) 01/27/2016 1034   TRIG 134 01/27/2016 1034   HDL 29 (L) 01/27/2016 1034   CHOLHDL 3.0 01/27/2016 1034   CHOLHDL 3.6 09/06/2015 0957   VLDL 20 09/06/2015 0957   LDLCALC 30 01/27/2016 1034      Wt Readings from Last 3 Encounters:  02/01/24 193 lb (87.5 kg)  12/22/23 190 lb (86.2 kg)  12/07/23 190 lb (86.2 kg)       ASSESSMENT AND PLAN:  1.  Peripheral arterial disease: Moderate to severe bilateral lower extremity claudication.  Recent angiography showed bilateral popliteal artery occlusion.   Currently with no ulceration or rest pain.  No indication for revascularization at this point.   Recommend a supervised exercise therapy program.  The patient was educated on risk factors surrounding PAD. I discussed the importance of controlling risk  factors and importance of regular exercise to help reduce pain. I discussed the effects of exercise on reducing claudications and improving his risk factors including hyperlipidemia and hypertension.   2. Coronary artery disease status post CABG and PCI with other forms of angina:    No significant change in symptoms.  3.  Bilateral carotid bruits with borderline stenosis on the right side: Recent carotid Doppler showed mild nonobstructive disease bilaterally.  4. Hyperlipidemia: Most recent lipid profile showed an LDL of 53.  Continue treatment with rosuvastatin  40 mg daily.  5.  Essential hypertension: Blood pressure is well-controlled on current medications.  6.  Bilateral renal artery stenosis: The right renal artery origin though was not well-visualized.  She currently has no indication for revascularization.  Will monitor this clinically for now.  7.  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  She has no symptoms of chronic mesenteric ischemia.  No indication for revascularization.   Disposition: She was referred to a supervised exercise therapy program.  Follow-up with me in 6 months.  Signed,  Deatrice Cage, MD  02/01/2024 10:30 AM    [facility] "

## 2024-02-04 ENCOUNTER — Telehealth (HOSPITAL_COMMUNITY): Payer: Self-pay

## 2024-02-04 NOTE — Telephone Encounter (Signed)
 Called Taylor [name] to see if she was interested in PAD/SET program. Pt stated yes. Pt will come in for orientation on 02/11/24 at 1 pm and exercise in the 1:15 PR class.

## 2024-02-04 NOTE — Telephone Encounter (Signed)
 Pt insurance is active and benefits verified through [employer]. Co-pay $0, DED $283/Unknown met, out of pocket $0/$0 met, co-insurance 20%. No pre-authorization required.   2ndary insurance is active and benefits verified through [facility]. Co-pay $0, DED $0/$0 met, out of pocket $0/$0 met, co-insurance 80%. No pre-authorization required. 02/04/2024 @ 8:30am, spoke with Zipporah MATSU., REF# [id number].

## 2024-02-10 ENCOUNTER — Telehealth (HOSPITAL_COMMUNITY): Payer: Self-pay

## 2024-02-10 NOTE — Telephone Encounter (Signed)
-----   Message from Meade ORN sent at 02/10/2024  9:26 AM EST ----- Taylor [name], This pt called and stated that she is sick, and that she will need to reschedule her SET. She stated that she will call you back to reschedule.

## 2024-02-11 ENCOUNTER — Encounter (HOSPITAL_COMMUNITY)

## 2024-02-15 ENCOUNTER — Encounter (HOSPITAL_COMMUNITY)

## 2024-02-17 ENCOUNTER — Encounter (HOSPITAL_COMMUNITY)

## 2024-02-22 ENCOUNTER — Encounter (HOSPITAL_COMMUNITY)

## 2024-02-24 ENCOUNTER — Encounter (HOSPITAL_COMMUNITY)

## 2024-02-29 ENCOUNTER — Encounter (HOSPITAL_COMMUNITY)

## 2024-03-02 ENCOUNTER — Encounter (HOSPITAL_COMMUNITY)

## 2024-03-07 ENCOUNTER — Encounter (HOSPITAL_COMMUNITY)

## 2024-03-09 ENCOUNTER — Encounter (HOSPITAL_COMMUNITY)

## 2024-03-14 ENCOUNTER — Encounter (HOSPITAL_COMMUNITY)

## 2024-03-16 ENCOUNTER — Encounter (HOSPITAL_COMMUNITY)

## 2024-03-21 ENCOUNTER — Encounter (HOSPITAL_COMMUNITY)

## 2024-03-23 ENCOUNTER — Encounter (HOSPITAL_COMMUNITY)

## 2024-03-28 ENCOUNTER — Encounter (HOSPITAL_COMMUNITY)

## 2024-03-30 ENCOUNTER — Encounter (HOSPITAL_COMMUNITY)

## 2024-04-04 ENCOUNTER — Encounter (HOSPITAL_COMMUNITY)

## 2024-04-06 ENCOUNTER — Encounter (HOSPITAL_COMMUNITY)

## 2024-04-11 ENCOUNTER — Encounter (HOSPITAL_COMMUNITY)

## 2024-04-13 ENCOUNTER — Encounter (HOSPITAL_COMMUNITY)

## 2024-04-18 ENCOUNTER — Encounter (HOSPITAL_COMMUNITY)

## 2024-04-20 ENCOUNTER — Encounter (HOSPITAL_COMMUNITY)

## 2024-04-25 ENCOUNTER — Encounter (HOSPITAL_COMMUNITY)

## 2024-04-27 ENCOUNTER — Encounter (HOSPITAL_COMMUNITY)

## 2024-07-28 ENCOUNTER — Encounter (INDEPENDENT_AMBULATORY_CARE_PROVIDER_SITE_OTHER): Admitting: Ophthalmology
